# Patient Record
Sex: Female | Born: 1960
Health system: Southern US, Community
[De-identification: ages and names within clinical notes are randomized; demographics above are authoritative.]

## PROBLEM LIST (undated history)

## (undated) DIAGNOSIS — R0602 Shortness of breath: Secondary | ICD-10-CM

## (undated) DIAGNOSIS — E876 Hypokalemia: Secondary | ICD-10-CM

## (undated) DIAGNOSIS — I509 Heart failure, unspecified: Secondary | ICD-10-CM

## (undated) DIAGNOSIS — K219 Gastro-esophageal reflux disease without esophagitis: Secondary | ICD-10-CM

## (undated) DIAGNOSIS — R011 Cardiac murmur, unspecified: Secondary | ICD-10-CM

## (undated) DIAGNOSIS — I10 Essential (primary) hypertension: Secondary | ICD-10-CM

## (undated) DIAGNOSIS — D649 Anemia, unspecified: Secondary | ICD-10-CM

## (undated) DIAGNOSIS — E785 Hyperlipidemia, unspecified: Secondary | ICD-10-CM

## (undated) DIAGNOSIS — N189 Chronic kidney disease, unspecified: Secondary | ICD-10-CM

## (undated) DIAGNOSIS — L97909 Non-pressure chronic ulcer of unspecified part of unspecified lower leg with unspecified severity: Secondary | ICD-10-CM

## (undated) DIAGNOSIS — I83009 Varicose veins of unspecified lower extremity with ulcer of unspecified site: Secondary | ICD-10-CM

## (undated) DIAGNOSIS — J42 Unspecified chronic bronchitis: Secondary | ICD-10-CM

---

## 1989-04-21 HISTORY — PX: VAGINAL HYSTERECTOMY: SUR661

## 1998-05-26 ENCOUNTER — Emergency Department (HOSPITAL_COMMUNITY): Admission: EM | Admit: 1998-05-26 | Discharge: 1998-05-26 | Payer: Self-pay | Admitting: Emergency Medicine

## 1998-07-22 ENCOUNTER — Emergency Department (HOSPITAL_COMMUNITY): Admission: EM | Admit: 1998-07-22 | Discharge: 1998-07-22 | Payer: Self-pay | Admitting: Emergency Medicine

## 1998-11-20 ENCOUNTER — Emergency Department (HOSPITAL_COMMUNITY): Admission: EM | Admit: 1998-11-20 | Discharge: 1998-11-20 | Payer: Self-pay | Admitting: Emergency Medicine

## 2000-01-19 ENCOUNTER — Emergency Department (HOSPITAL_COMMUNITY): Admission: EM | Admit: 2000-01-19 | Discharge: 2000-01-19 | Payer: Self-pay | Admitting: Emergency Medicine

## 2000-01-19 ENCOUNTER — Encounter: Payer: Self-pay | Admitting: Emergency Medicine

## 2001-01-03 ENCOUNTER — Emergency Department (HOSPITAL_COMMUNITY): Admission: EM | Admit: 2001-01-03 | Discharge: 2001-01-03 | Payer: Self-pay | Admitting: Emergency Medicine

## 2001-01-03 ENCOUNTER — Encounter: Payer: Self-pay | Admitting: Emergency Medicine

## 2001-04-09 ENCOUNTER — Emergency Department (HOSPITAL_COMMUNITY): Admission: EM | Admit: 2001-04-09 | Discharge: 2001-04-09 | Payer: Self-pay

## 2001-10-25 ENCOUNTER — Emergency Department (HOSPITAL_COMMUNITY): Admission: EM | Admit: 2001-10-25 | Discharge: 2001-10-25 | Payer: Self-pay | Admitting: *Deleted

## 2002-01-23 ENCOUNTER — Emergency Department (HOSPITAL_COMMUNITY): Admission: EM | Admit: 2002-01-23 | Discharge: 2002-01-23 | Payer: Self-pay | Admitting: Emergency Medicine

## 2002-01-24 ENCOUNTER — Encounter: Payer: Self-pay | Admitting: Emergency Medicine

## 2002-08-08 ENCOUNTER — Encounter: Payer: Self-pay | Admitting: Emergency Medicine

## 2002-08-08 ENCOUNTER — Inpatient Hospital Stay (HOSPITAL_COMMUNITY): Admission: EM | Admit: 2002-08-08 | Discharge: 2002-08-09 | Payer: Self-pay | Admitting: Emergency Medicine

## 2003-07-15 ENCOUNTER — Inpatient Hospital Stay (HOSPITAL_COMMUNITY): Admission: EM | Admit: 2003-07-15 | Discharge: 2003-07-16 | Payer: Self-pay | Admitting: Emergency Medicine

## 2003-08-18 ENCOUNTER — Ambulatory Visit (HOSPITAL_COMMUNITY): Admission: RE | Admit: 2003-08-18 | Discharge: 2003-08-18 | Payer: Self-pay | Admitting: Internal Medicine

## 2003-08-22 ENCOUNTER — Emergency Department (HOSPITAL_COMMUNITY): Admission: EM | Admit: 2003-08-22 | Discharge: 2003-08-23 | Payer: Self-pay | Admitting: Emergency Medicine

## 2003-10-30 ENCOUNTER — Emergency Department (HOSPITAL_COMMUNITY): Admission: EM | Admit: 2003-10-30 | Discharge: 2003-10-30 | Payer: Self-pay | Admitting: Emergency Medicine

## 2003-11-01 ENCOUNTER — Emergency Department (HOSPITAL_COMMUNITY): Admission: EM | Admit: 2003-11-01 | Discharge: 2003-11-02 | Payer: Self-pay | Admitting: Emergency Medicine

## 2003-11-03 ENCOUNTER — Inpatient Hospital Stay (HOSPITAL_COMMUNITY): Admission: EM | Admit: 2003-11-03 | Discharge: 2003-11-07 | Payer: Self-pay | Admitting: Emergency Medicine

## 2004-02-15 ENCOUNTER — Inpatient Hospital Stay (HOSPITAL_COMMUNITY): Admission: EM | Admit: 2004-02-15 | Discharge: 2004-02-17 | Payer: Self-pay | Admitting: Emergency Medicine

## 2004-02-26 ENCOUNTER — Other Ambulatory Visit: Admission: RE | Admit: 2004-02-26 | Discharge: 2004-02-26 | Payer: Self-pay | Admitting: Obstetrics and Gynecology

## 2004-03-31 ENCOUNTER — Emergency Department (HOSPITAL_COMMUNITY): Admission: EM | Admit: 2004-03-31 | Discharge: 2004-03-31 | Payer: Self-pay | Admitting: Emergency Medicine

## 2004-04-18 ENCOUNTER — Emergency Department (HOSPITAL_COMMUNITY): Admission: EM | Admit: 2004-04-18 | Discharge: 2004-04-19 | Payer: Self-pay | Admitting: Emergency Medicine

## 2004-04-21 ENCOUNTER — Emergency Department (HOSPITAL_COMMUNITY): Admission: EM | Admit: 2004-04-21 | Discharge: 2004-04-21 | Payer: Self-pay | Admitting: Emergency Medicine

## 2004-05-05 ENCOUNTER — Encounter (INDEPENDENT_AMBULATORY_CARE_PROVIDER_SITE_OTHER): Payer: Self-pay | Admitting: *Deleted

## 2004-05-05 ENCOUNTER — Inpatient Hospital Stay (HOSPITAL_COMMUNITY): Admission: RE | Admit: 2004-05-05 | Discharge: 2004-05-13 | Payer: Self-pay | Admitting: Obstetrics and Gynecology

## 2004-05-09 ENCOUNTER — Encounter (INDEPENDENT_AMBULATORY_CARE_PROVIDER_SITE_OTHER): Payer: Self-pay | Admitting: Cardiology

## 2004-06-08 ENCOUNTER — Ambulatory Visit: Payer: Self-pay | Admitting: Family Medicine

## 2004-06-27 ENCOUNTER — Ambulatory Visit: Payer: Self-pay | Admitting: Family Medicine

## 2005-01-10 ENCOUNTER — Emergency Department (HOSPITAL_COMMUNITY): Admission: EM | Admit: 2005-01-10 | Discharge: 2005-01-10 | Payer: Self-pay | Admitting: Emergency Medicine

## 2005-01-16 ENCOUNTER — Emergency Department (HOSPITAL_COMMUNITY): Admission: AD | Admit: 2005-01-16 | Discharge: 2005-01-16 | Payer: Self-pay | Admitting: Family Medicine

## 2005-01-23 ENCOUNTER — Ambulatory Visit: Payer: Self-pay | Admitting: Family Medicine

## 2005-02-02 ENCOUNTER — Ambulatory Visit: Payer: Self-pay | Admitting: Family Medicine

## 2005-08-21 ENCOUNTER — Emergency Department (HOSPITAL_COMMUNITY): Admission: EM | Admit: 2005-08-21 | Discharge: 2005-08-21 | Payer: Self-pay | Admitting: Family Medicine

## 2006-01-06 ENCOUNTER — Emergency Department (HOSPITAL_COMMUNITY): Admission: EM | Admit: 2006-01-06 | Discharge: 2006-01-07 | Payer: Self-pay | Admitting: Emergency Medicine

## 2006-02-06 ENCOUNTER — Emergency Department (HOSPITAL_COMMUNITY): Admission: EM | Admit: 2006-02-06 | Discharge: 2006-02-06 | Payer: Self-pay | Admitting: Emergency Medicine

## 2006-02-15 ENCOUNTER — Emergency Department (HOSPITAL_COMMUNITY): Admission: EM | Admit: 2006-02-15 | Discharge: 2006-02-15 | Payer: Self-pay | Admitting: Emergency Medicine

## 2006-07-19 ENCOUNTER — Emergency Department (HOSPITAL_COMMUNITY): Admission: EM | Admit: 2006-07-19 | Discharge: 2006-07-19 | Payer: Self-pay | Admitting: Emergency Medicine

## 2006-12-04 ENCOUNTER — Ambulatory Visit: Payer: Self-pay | Admitting: Internal Medicine

## 2006-12-04 ENCOUNTER — Inpatient Hospital Stay (HOSPITAL_COMMUNITY): Admission: EM | Admit: 2006-12-04 | Discharge: 2006-12-06 | Payer: Self-pay | Admitting: Emergency Medicine

## 2006-12-05 ENCOUNTER — Encounter: Payer: Self-pay | Admitting: Internal Medicine

## 2007-02-07 ENCOUNTER — Emergency Department (HOSPITAL_COMMUNITY): Admission: EM | Admit: 2007-02-07 | Discharge: 2007-02-07 | Payer: Self-pay | Admitting: Emergency Medicine

## 2007-07-31 ENCOUNTER — Emergency Department (HOSPITAL_COMMUNITY): Admission: EM | Admit: 2007-07-31 | Discharge: 2007-07-31 | Payer: Self-pay | Admitting: Emergency Medicine

## 2007-10-09 ENCOUNTER — Emergency Department (HOSPITAL_COMMUNITY): Admission: EM | Admit: 2007-10-09 | Discharge: 2007-10-09 | Payer: Self-pay | Admitting: Emergency Medicine

## 2007-11-14 ENCOUNTER — Ambulatory Visit: Payer: Self-pay | Admitting: Internal Medicine

## 2007-11-14 ENCOUNTER — Ambulatory Visit: Payer: Self-pay | Admitting: Family Medicine

## 2007-11-21 ENCOUNTER — Ambulatory Visit: Payer: Self-pay | Admitting: *Deleted

## 2007-12-19 ENCOUNTER — Encounter (INDEPENDENT_AMBULATORY_CARE_PROVIDER_SITE_OTHER): Payer: Self-pay | Admitting: Emergency Medicine

## 2007-12-19 ENCOUNTER — Emergency Department (HOSPITAL_COMMUNITY): Admission: EM | Admit: 2007-12-19 | Discharge: 2007-12-19 | Payer: Self-pay | Admitting: Emergency Medicine

## 2007-12-19 ENCOUNTER — Ambulatory Visit: Payer: Self-pay | Admitting: Surgery

## 2008-01-01 IMAGING — CR DG CHEST 1V PORT
1 series · 1 of 1 positions shown · non-contrast
Comparison: [DATE].

CLINICAL DATA: Chest pain and shortness of breath. 
 PORTABLE CHEST ? 1 VIEW:

[view not recorded]
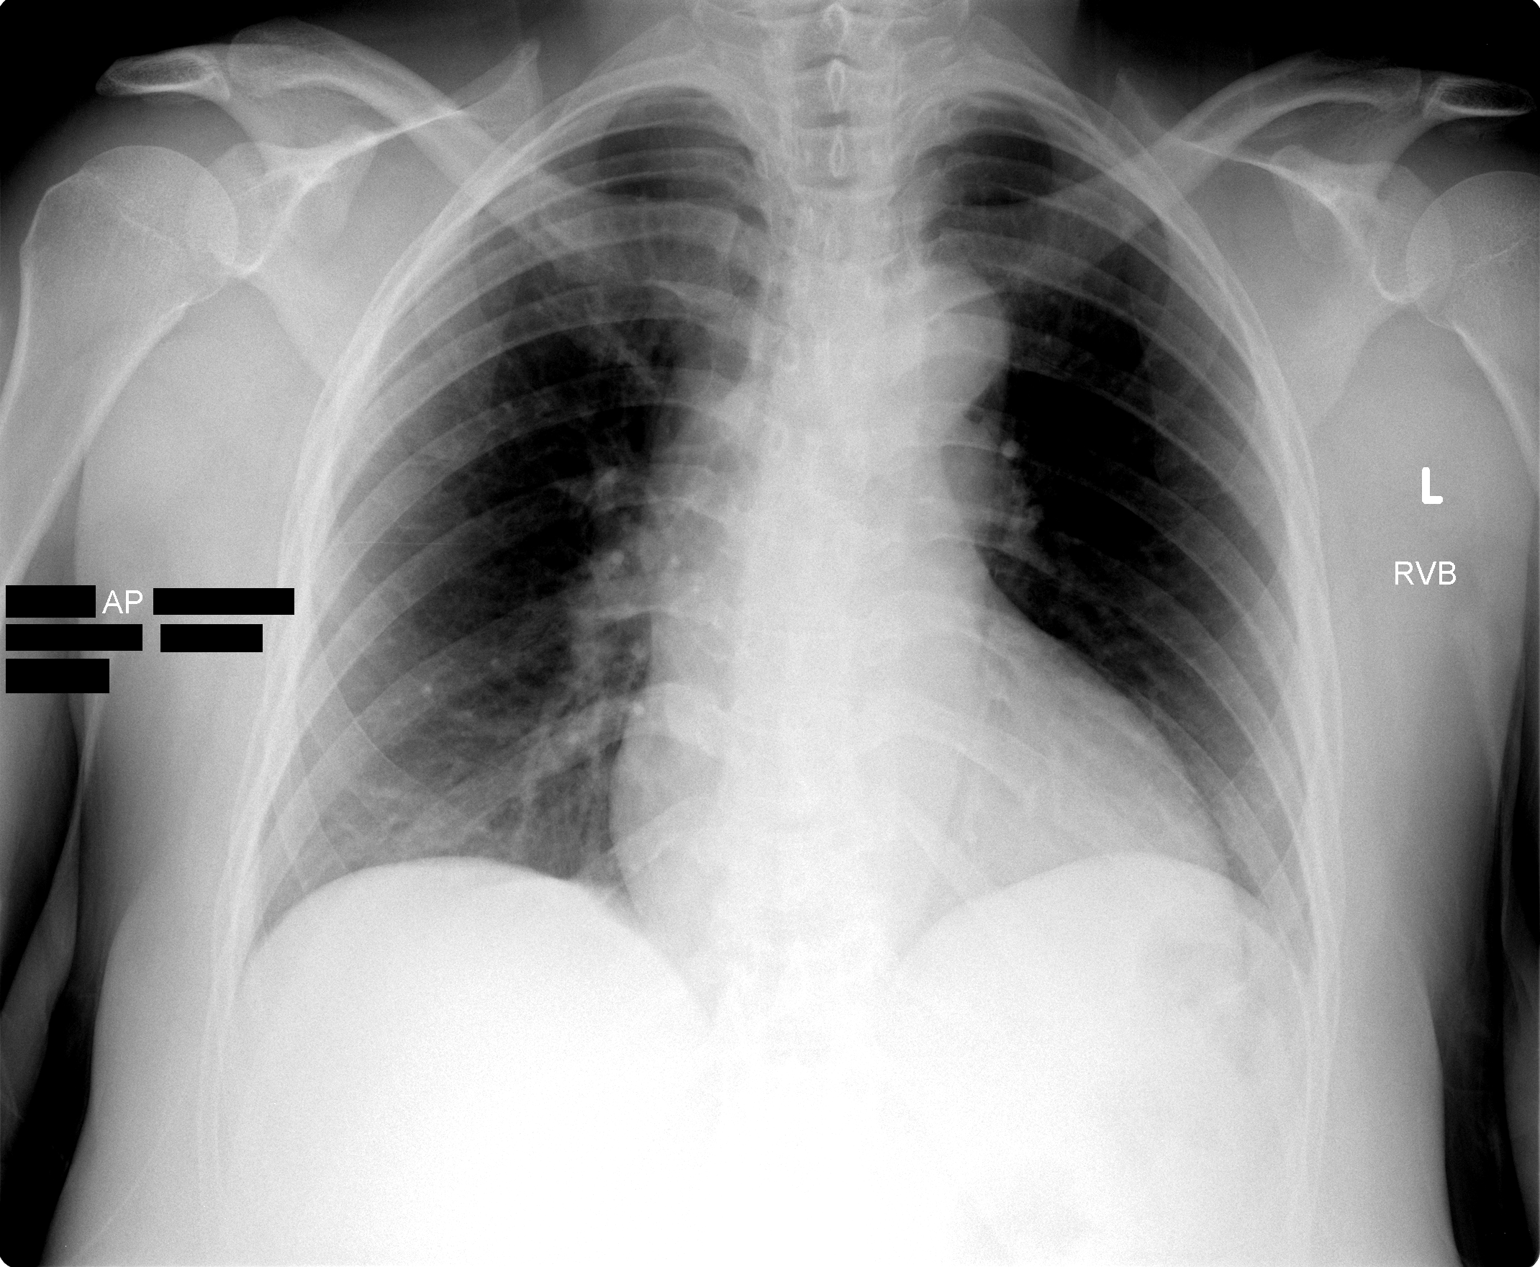

[1 of 1 positions shown; findings below may reference images not displayed]

FINDINGS: Heart size is normal. There are no effusions or interstitial edema.  There is mild atelectasis of the right base.
IMPRESSION: Mild right base atelectasis.

## 2008-04-06 ENCOUNTER — Ambulatory Visit: Payer: Self-pay | Admitting: Internal Medicine

## 2008-04-06 ENCOUNTER — Encounter: Payer: Self-pay | Admitting: Internal Medicine

## 2008-04-06 ENCOUNTER — Ambulatory Visit: Payer: Self-pay | Admitting: *Deleted

## 2008-04-06 LAB — CONVERTED CEMR LAB
ALT: 16 units/L (ref 0–35)
Alkaline Phosphatase: 45 units/L (ref 39–117)
Basophils Absolute: 0 10*3/uL (ref 0.0–0.1)
Basophils Relative: 0 % (ref 0–1)
CO2: 22 meq/L (ref 19–32)
Calcium: 8.8 mg/dL (ref 8.4–10.5)
Eosinophils Relative: 1 % (ref 0–5)
Hemoglobin: 12.6 g/dL (ref 12.0–15.0)
MCHC: 31.1 g/dL (ref 30.0–36.0)
Monocytes Absolute: 0.3 10*3/uL (ref 0.1–1.0)
Potassium: 3.4 meq/L — ABNORMAL LOW (ref 3.5–5.3)
Sodium: 140 meq/L (ref 135–145)
Total Bilirubin: 0.9 mg/dL (ref 0.3–1.2)
Total Protein: 7.1 g/dL (ref 6.0–8.3)
WBC: 4.6 10*3/uL (ref 4.0–10.5)

## 2008-08-05 ENCOUNTER — Inpatient Hospital Stay (HOSPITAL_COMMUNITY): Admission: EM | Admit: 2008-08-05 | Discharge: 2008-08-07 | Payer: Self-pay | Admitting: Internal Medicine

## 2008-08-05 ENCOUNTER — Ambulatory Visit: Payer: Self-pay | Admitting: Cardiology

## 2008-08-06 ENCOUNTER — Encounter (INDEPENDENT_AMBULATORY_CARE_PROVIDER_SITE_OTHER): Payer: Self-pay | Admitting: Internal Medicine

## 2008-12-03 ENCOUNTER — Ambulatory Visit: Payer: Self-pay | Admitting: Family Medicine

## 2008-12-07 ENCOUNTER — Ambulatory Visit (HOSPITAL_COMMUNITY): Admission: RE | Admit: 2008-12-07 | Discharge: 2008-12-07 | Payer: Self-pay | Admitting: Family Medicine

## 2009-05-12 ENCOUNTER — Emergency Department (HOSPITAL_COMMUNITY): Admission: EM | Admit: 2009-05-12 | Discharge: 2009-05-12 | Payer: Self-pay | Admitting: Emergency Medicine

## 2009-08-21 ENCOUNTER — Emergency Department (HOSPITAL_COMMUNITY): Admission: EM | Admit: 2009-08-21 | Discharge: 2009-08-21 | Payer: Self-pay | Admitting: Emergency Medicine

## 2010-02-13 ENCOUNTER — Emergency Department (HOSPITAL_COMMUNITY): Admission: EM | Admit: 2010-02-13 | Discharge: 2010-02-13 | Payer: Self-pay | Admitting: Emergency Medicine

## 2010-02-13 ENCOUNTER — Encounter (INDEPENDENT_AMBULATORY_CARE_PROVIDER_SITE_OTHER): Payer: Self-pay | Admitting: Emergency Medicine

## 2010-02-13 ENCOUNTER — Ambulatory Visit: Payer: Self-pay | Admitting: Surgery

## 2010-03-05 ENCOUNTER — Emergency Department (HOSPITAL_COMMUNITY): Admission: EM | Admit: 2010-03-05 | Discharge: 2010-03-05 | Payer: Self-pay | Admitting: Emergency Medicine

## 2010-03-26 ENCOUNTER — Emergency Department (HOSPITAL_COMMUNITY): Admission: EM | Admit: 2010-03-26 | Discharge: 2010-03-26 | Payer: Self-pay | Admitting: Emergency Medicine

## 2010-03-26 ENCOUNTER — Encounter (INDEPENDENT_AMBULATORY_CARE_PROVIDER_SITE_OTHER): Payer: Self-pay | Admitting: Emergency Medicine

## 2010-03-26 ENCOUNTER — Ambulatory Visit: Payer: Self-pay | Admitting: Vascular Surgery

## 2010-05-28 ENCOUNTER — Emergency Department (HOSPITAL_COMMUNITY): Admission: EM | Admit: 2010-05-28 | Discharge: 2010-05-29 | Payer: Self-pay | Admitting: Emergency Medicine

## 2010-06-03 ENCOUNTER — Encounter (HOSPITAL_BASED_OUTPATIENT_CLINIC_OR_DEPARTMENT_OTHER)
Admission: RE | Admit: 2010-06-03 | Discharge: 2010-09-01 | Payer: Self-pay | Source: Home / Self Care | Attending: Internal Medicine | Admitting: Internal Medicine

## 2010-06-07 ENCOUNTER — Ambulatory Visit: Payer: Self-pay | Admitting: Family Medicine

## 2010-09-05 ENCOUNTER — Encounter (HOSPITAL_BASED_OUTPATIENT_CLINIC_OR_DEPARTMENT_OTHER)
Admission: RE | Admit: 2010-09-05 | Discharge: 2010-09-20 | Payer: Self-pay | Source: Home / Self Care | Attending: Internal Medicine | Admitting: Internal Medicine

## 2010-11-04 LAB — CBC
MCHC: 31.6 g/dL (ref 30.0–36.0)
MCV: 88 fL (ref 78.0–100.0)

## 2010-11-04 LAB — DIFFERENTIAL
Basophils Absolute: 0 10*3/uL (ref 0.0–0.1)
Basophils Relative: 0 % (ref 0–1)
Eosinophils Absolute: 0.1 10*3/uL (ref 0.0–0.7)
Eosinophils Relative: 1 % (ref 0–5)
Lymphs Abs: 2.8 10*3/uL (ref 0.7–4.0)
Monocytes Absolute: 0.4 10*3/uL (ref 0.1–1.0)
Neutro Abs: 3.7 10*3/uL (ref 1.7–7.7)
Neutrophils Relative %: 53 % (ref 43–77)

## 2010-11-04 LAB — BASIC METABOLIC PANEL
CO2: 30 mEq/L (ref 19–32)
GFR calc Af Amer: >60 mL/min (ref >60)
GFR calc non Af Amer: >60 mL/min (ref >60)

## 2010-11-05 LAB — DIFFERENTIAL
Basophils Absolute: 0 10*3/uL (ref 0.0–0.1)
Basophils Relative: 0 % (ref 0–1)
Eosinophils Relative: 1 % (ref 0–5)
Monocytes Absolute: 0.4 10*3/uL (ref 0.1–1.0)
Neutrophils Relative %: 57 % (ref 43–77)

## 2010-11-05 LAB — CBC
HCT: 36.2 % (ref 36.0–46.0)
MCHC: 33.6 g/dL (ref 30.0–36.0)
MCV: 89.4 fL (ref 78.0–100.0)
Platelets: 250 10*3/uL (ref 150–400)
RDW: 13.4 % (ref 11.5–15.5)

## 2010-11-05 LAB — POCT CARDIAC MARKERS: Troponin i, poc: <0.05 ng/mL (ref 0.00–0.09)

## 2010-11-05 LAB — URINALYSIS, ROUTINE W REFLEX MICROSCOPIC
Glucose, UA: NEGATIVE mg/dL
Urobilinogen, UA: 0.2 mg/dL (ref 0.0–1.0)

## 2010-11-05 LAB — COMPREHENSIVE METABOLIC PANEL
Albumin: 3.8 g/dL (ref 3.5–5.2)
Alkaline Phosphatase: 52 U/L (ref 39–117)
CO2: 27 mEq/L (ref 19–32)
Chloride: 104 mEq/L (ref 96–112)
GFR calc Af Amer: >60 mL/min (ref >60)
GFR calc non Af Amer: >60 mL/min (ref >60)
Sodium: 141 mEq/L (ref 135–145)
Total Bilirubin: 0.3 mg/dL (ref 0.3–1.2)

## 2010-11-05 LAB — RAPID URINE DRUG SCREEN, HOSP PERFORMED
Benzodiazepines: NOT DETECTED
Cocaine: POSITIVE — AB

## 2010-11-06 LAB — URINE MICROSCOPIC-ADD ON

## 2010-11-06 LAB — URINALYSIS, ROUTINE W REFLEX MICROSCOPIC
Bilirubin Urine: NEGATIVE
Leukocytes, UA: NEGATIVE
Nitrite: NEGATIVE
Specific Gravity, Urine: 1.02 (ref 1.005–1.030)
pH: 5.5 (ref 5.0–8.0)

## 2010-11-06 LAB — DIFFERENTIAL
Basophils Absolute: 0 10*3/uL (ref 0.0–0.1)
Eosinophils Relative: 2 % (ref 0–5)
Lymphocytes Relative: 31 % (ref 12–46)
Monocytes Absolute: 0.4 10*3/uL (ref 0.1–1.0)

## 2010-11-06 LAB — POCT CARDIAC MARKERS
CKMB, poc: 3 ng/mL (ref 1.0–8.0)
Troponin i, poc: <0.05 ng/mL (ref 0.00–0.09)

## 2010-11-06 LAB — CBC
HCT: 33.2 % — ABNORMAL LOW (ref 36.0–46.0)
MCH: 29.8 pg (ref 26.0–34.0)
MCHC: 33.7 g/dL (ref 30.0–36.0)
Platelets: 276 10*3/uL (ref 150–400)
RBC: 3.75 MIL/uL — ABNORMAL LOW (ref 3.87–5.11)

## 2010-11-06 LAB — COMPREHENSIVE METABOLIC PANEL
Albumin: 3.6 g/dL (ref 3.5–5.2)
Alkaline Phosphatase: 51 U/L (ref 39–117)
BUN: 13 mg/dL (ref 6–23)
CO2: 27 mEq/L (ref 19–32)
Chloride: 107 mEq/L (ref 96–112)
GFR calc Af Amer: >60 mL/min (ref >60)
GFR calc non Af Amer: >60 mL/min (ref >60)
Glucose, Bld: 106 mg/dL — ABNORMAL HIGH (ref 70–99)
Total Bilirubin: 0.4 mg/dL (ref 0.3–1.2)
Total Protein: 7.1 g/dL (ref 6.0–8.3)

## 2010-11-06 LAB — BRAIN NATRIURETIC PEPTIDE: Pro B Natriuretic peptide (BNP): 33.6 pg/mL (ref 0.0–100.0)

## 2010-11-06 LAB — RAPID URINE DRUG SCREEN, HOSP PERFORMED: Tetrahydrocannabinol: NOT DETECTED

## 2010-11-06 LAB — KETONES, QUALITATIVE: Acetone, Bld: NEGATIVE

## 2010-11-25 LAB — DIFFERENTIAL
Basophils Absolute: 0 10*3/uL (ref 0.0–0.1)
Eosinophils Relative: 1 % (ref 0–5)
Lymphocytes Relative: 40 % (ref 12–46)
Neutro Abs: 2.2 10*3/uL (ref 1.7–7.7)

## 2010-11-25 LAB — POCT I-STAT, CHEM 8
BUN: 6 mg/dL (ref 6–23)
Chloride: 101 mEq/L (ref 96–112)
HCT: 40 % (ref 36.0–46.0)
Sodium: 143 mEq/L (ref 135–145)
TCO2: 29 mmol/L (ref 0–100)

## 2010-11-25 LAB — POCT CARDIAC MARKERS
Troponin i, poc: <0.05 ng/mL (ref 0.00–0.09)
Troponin i, poc: <0.05 ng/mL (ref 0.00–0.09)

## 2010-11-25 LAB — CBC
Platelets: 275 10*3/uL (ref 150–400)
RDW: 12.9 % (ref 11.5–15.5)

## 2010-12-19 ENCOUNTER — Encounter (HOSPITAL_BASED_OUTPATIENT_CLINIC_OR_DEPARTMENT_OTHER): Payer: Self-pay | Attending: Internal Medicine

## 2010-12-19 DIAGNOSIS — L97309 Non-pressure chronic ulcer of unspecified ankle with unspecified severity: Secondary | ICD-10-CM | POA: Insufficient documentation

## 2010-12-19 DIAGNOSIS — L8992 Pressure ulcer of unspecified site, stage 2: Secondary | ICD-10-CM | POA: Insufficient documentation

## 2010-12-19 DIAGNOSIS — L97509 Non-pressure chronic ulcer of other part of unspecified foot with unspecified severity: Secondary | ICD-10-CM | POA: Insufficient documentation

## 2010-12-19 DIAGNOSIS — L89109 Pressure ulcer of unspecified part of back, unspecified stage: Secondary | ICD-10-CM | POA: Insufficient documentation

## 2010-12-19 DIAGNOSIS — M7989 Other specified soft tissue disorders: Secondary | ICD-10-CM | POA: Insufficient documentation

## 2010-12-22 ENCOUNTER — Encounter (HOSPITAL_BASED_OUTPATIENT_CLINIC_OR_DEPARTMENT_OTHER): Payer: Self-pay | Attending: Internal Medicine

## 2010-12-22 DIAGNOSIS — I10 Essential (primary) hypertension: Secondary | ICD-10-CM | POA: Insufficient documentation

## 2010-12-22 DIAGNOSIS — L97809 Non-pressure chronic ulcer of other part of unspecified lower leg with unspecified severity: Secondary | ICD-10-CM | POA: Insufficient documentation

## 2010-12-22 DIAGNOSIS — Z79899 Other long term (current) drug therapy: Secondary | ICD-10-CM | POA: Insufficient documentation

## 2010-12-22 DIAGNOSIS — A4901 Methicillin susceptible Staphylococcus aureus infection, unspecified site: Secondary | ICD-10-CM | POA: Insufficient documentation

## 2010-12-22 DIAGNOSIS — L02419 Cutaneous abscess of limb, unspecified: Secondary | ICD-10-CM | POA: Insufficient documentation

## 2010-12-22 DIAGNOSIS — I872 Venous insufficiency (chronic) (peripheral): Secondary | ICD-10-CM | POA: Insufficient documentation

## 2010-12-22 DIAGNOSIS — A498 Other bacterial infections of unspecified site: Secondary | ICD-10-CM | POA: Insufficient documentation

## 2010-12-28 ENCOUNTER — Encounter (HOSPITAL_COMMUNITY): Payer: Self-pay | Admitting: Radiology

## 2010-12-28 ENCOUNTER — Emergency Department (HOSPITAL_COMMUNITY): Payer: Self-pay

## 2010-12-28 ENCOUNTER — Inpatient Hospital Stay (HOSPITAL_COMMUNITY): Payer: Self-pay

## 2010-12-28 ENCOUNTER — Inpatient Hospital Stay (HOSPITAL_COMMUNITY)
Admission: EM | Admit: 2010-12-28 | Discharge: 2010-12-30 | DRG: 292 | Disposition: A | Discharge status: Home / Self Care | Payer: Self-pay | Attending: Internal Medicine | Admitting: Internal Medicine

## 2010-12-28 DIAGNOSIS — I868 Varicose veins of other specified sites: Secondary | ICD-10-CM | POA: Diagnosis present

## 2010-12-28 DIAGNOSIS — I517 Cardiomegaly: Secondary | ICD-10-CM | POA: Diagnosis present

## 2010-12-28 DIAGNOSIS — M7120 Synovial cyst of popliteal space [Baker], unspecified knee: Secondary | ICD-10-CM | POA: Diagnosis present

## 2010-12-28 DIAGNOSIS — D563 Thalassemia minor: Secondary | ICD-10-CM | POA: Diagnosis present

## 2010-12-28 DIAGNOSIS — I5033 Acute on chronic diastolic (congestive) heart failure: Principal | ICD-10-CM | POA: Diagnosis present

## 2010-12-28 DIAGNOSIS — E876 Hypokalemia: Secondary | ICD-10-CM | POA: Diagnosis present

## 2010-12-28 DIAGNOSIS — M79609 Pain in unspecified limb: Secondary | ICD-10-CM

## 2010-12-28 DIAGNOSIS — Z6834 Body mass index (BMI) 34.0-34.9, adult: Secondary | ICD-10-CM

## 2010-12-28 DIAGNOSIS — J45909 Unspecified asthma, uncomplicated: Secondary | ICD-10-CM | POA: Diagnosis present

## 2010-12-28 DIAGNOSIS — F17200 Nicotine dependence, unspecified, uncomplicated: Secondary | ICD-10-CM | POA: Diagnosis present

## 2010-12-28 DIAGNOSIS — I509 Heart failure, unspecified: Secondary | ICD-10-CM | POA: Diagnosis present

## 2010-12-28 DIAGNOSIS — R079 Chest pain, unspecified: Secondary | ICD-10-CM | POA: Diagnosis present

## 2010-12-28 DIAGNOSIS — Z91199 Patient's noncompliance with other medical treatment and regimen due to unspecified reason: Secondary | ICD-10-CM

## 2010-12-28 DIAGNOSIS — E279 Disorder of adrenal gland, unspecified: Secondary | ICD-10-CM | POA: Diagnosis present

## 2010-12-28 DIAGNOSIS — E785 Hyperlipidemia, unspecified: Secondary | ICD-10-CM | POA: Diagnosis present

## 2010-12-28 DIAGNOSIS — D649 Anemia, unspecified: Secondary | ICD-10-CM | POA: Diagnosis present

## 2010-12-28 DIAGNOSIS — Z9119 Patient's noncompliance with other medical treatment and regimen: Secondary | ICD-10-CM

## 2010-12-28 DIAGNOSIS — I10 Essential (primary) hypertension: Secondary | ICD-10-CM | POA: Diagnosis present

## 2010-12-28 DIAGNOSIS — F1910 Other psychoactive substance abuse, uncomplicated: Secondary | ICD-10-CM | POA: Diagnosis present

## 2010-12-28 LAB — CBC
HCT: 38.1 % (ref 36.0–46.0)
Hemoglobin: 12.1 g/dL (ref 12.0–15.0)
MCHC: 31.8 g/dL (ref 30.0–36.0)
RBC: 4.33 MIL/uL (ref 3.87–5.11)
WBC: 5.2 10*3/uL (ref 4.0–10.5)

## 2010-12-28 LAB — DIFFERENTIAL
Basophils Absolute: 0 10*3/uL (ref 0.0–0.1)
Lymphocytes Relative: 36 % (ref 12–46)
Monocytes Absolute: 0.4 10*3/uL (ref 0.1–1.0)
Neutro Abs: 2.8 10*3/uL (ref 1.7–7.7)
Neutrophils Relative %: 55 % (ref 43–77)

## 2010-12-28 LAB — BASIC METABOLIC PANEL
Calcium: 9 mg/dL (ref 8.4–10.5)
Creatinine, Ser: 0.87 mg/dL (ref 0.4–1.2)
GFR calc Af Amer: >60 mL/min (ref >60)
GFR calc non Af Amer: >60 mL/min (ref >60)
Sodium: 140 mEq/L (ref 135–145)

## 2010-12-28 LAB — MAGNESIUM: Magnesium: 1.7 mg/dL (ref 1.5–2.5)

## 2010-12-28 LAB — POCT CARDIAC MARKERS
CKMB, poc: 1.2 ng/mL (ref 1.0–8.0)
Myoglobin, poc: 71.5 ng/mL (ref 12–200)
Troponin i, poc: <0.05 ng/mL (ref 0.00–0.09)

## 2010-12-28 LAB — HEMOGLOBIN A1C: Mean Plasma Glucose: 103 mg/dL (ref <117)

## 2010-12-28 LAB — PRO B NATRIURETIC PEPTIDE: Pro B Natriuretic peptide (BNP): 390.5 pg/mL — ABNORMAL HIGH (ref 0–125)

## 2010-12-28 MED ORDER — IOHEXOL 300 MG/ML  SOLN
100.0000 mL | Freq: Once | INTRAMUSCULAR | Status: AC | PRN
  Administered 2010-12-28: 100 mL via INTRAVENOUS

## 2010-12-28 NOTE — H&P (Signed)
[name], HORNBECK NO.:  [id number]  MEDICAL RECORD NO.:  [id number]           PATIENT TYPE:  E  LOCATION:  [facility]                         FACILITY:  [facility]  PHYSICIAN:  Marinda Elk, M.D.DATE OF BIRTH:  1961-04-22  DATE OF ADMISSION:  12/28/2010 DATE OF DISCHARGE:                             HISTORY & PHYSICAL   PRIMARY CARE DOCTOR:  [facility].  CHIEF COMPLAINT:  Chest discomfort.  HISTORY OF PRESENT ILLNESS:  This is a 50 year old female with past medical history of malignant hypertension, history of transient chest pain, history of cardiomegaly with polysubstance abuse and diastolic dysfunction by 2-D echo in December 2009 that comes in for chest discomfort.  The patient relates that this has been going on for some weeks.  Has progressively been getting worse.  She relates she has taking her clonidine regularly, but has run out of her other hypertensive medication, lisinopril, hydrochlorothiazide and atenolol has not taken it for the 5 days.  She relates that her chest discomfort has gotten so worse to the point where she cannot sleep flat at night. She lay in bed, in the recliner.  She says she could not go up the stairs without stopping.  She has stopped half way and stopped for 10 minutes, noted to catch her breath.  She relates sometime she wakes up in the middle of night short of breath.  And has to sit up and start walking and has to get up and sit and it gets better.  She relates she has not used drug recently.  She relates that she feels like a chest discomfort retrosternally.  Nothing makes it better or worse.  ALLERGIES:  No known drug allergies.  PAST MEDICAL HISTORY: 1. Malignant hypertension. 2. Transient paresthesia and blurry vision secondary to malignant     hypertension. 3. History of asthma. 4. History of polysubstance abuse. 5. Ongoing tobacco. 6. Hypokalemia. 7. Cardiomegaly. 8. Dyslipidemia. 9. Normocytic  anemia. 10.Diastolic heart failure. 11.Right adrenal mass likely benign adenoma. 12.Cocaine abuse. 13.Trichomoniasis.  MEDICATIONS: 1. She is on lisinopril/hydrochlorothiazide 20/25 mg p.o. daily. 2. Tylenol 325 mg 2 tabs b.i.d. as needed. 3. Doxycycline 100 mg p.o. daily. 4. Atenolol 50 mg every morning. 5. Clonidine 0.2 mg every 8 hours.  SOCIAL HISTORY:  She is single, lives with her daughter.  Denies any drugs in the last few months.  Continues to smoke one pack per day. Alcohol occasionally, about 2 beers on weekends.  FAMILY HISTORY:  Only significant for hypertension.  She does not remember anything else.  REVIEW OF SYSTEM:  GENERAL:  Positive for fatigability and dizziness. Eyes are positive.  Negative for blurry vision, double vision.  EARS: No tinnitus.  No drainage.  RESPIRATION:  No cough.  No wheezing. CARDIOVASCULAR:  Wise as above.  GI:  No nausea, vomiting or diarrhea. GU:  No dysuria, no hematuria, no polyuria.  HEMATOLOGY:  No bruising or bleeding.  No melena.  PHYSICAL EXAM:  VITAL SIGNS:  Temperature 99, blood pressure 205/130. She was breathing 20 times per minute, satting 99% on room air, heart rate is 70. GENERAL:  She is awake, alert and oriented  x3, lying comfortably in bed, in no acute distress, able to speak in full sentences. HEENT:  Normocephalic, atraumatic.  Pupils equally round and reactive to light.  Anicteric.  No jaundice.  No JVD.  No bruit.  No thyromegaly. No carotid bruit. LUNGS:  Good air movement with clear to auscultation. ABDOMEN:  Positive bowel sounds, nontender, nondistended and soft. EXTREMITIES:  Positive pulses, 2+ edema.  She has right leg which is wrapped today.  Nontender to palpation.  On the left, she has a knot like tender to palpation and mobile.  Appears to be a Baker's cyst. NEUROLOGIC:  She is alert, awake and oriented x4, coherent, fluent language and nonfocal.  LABS ON ADMISSION:  Sodium 140, potassium 3.3,  chloride 103, bicarb of 29, glucose of 90, BUN of 16, creatinine 0.8, calcium 9.9.  Her first set of point cardiac care markers are negative x1.  Her white count is 5.2, hemoglobin of 12, platelet count of 303, ANC of 2.8.  EKG shows pending at this time of this dictation.  CT angio is negative for PE and Doppler of lower extremity is negative for DVT which shows a Baker's cyst.  ASSESSMENT AND PLAN: 1. Malignant hypertension secondary to noncompliance.  She does have a     history of having malignant hypertension.  At this time, we will     start her on Lasix 20 mg IV b.i.d.  We will start her on     lisinopril.  We will discontinue the clonidine, start her on Coreg     3.125.  Also, we will check her UDS.  She has a history of     noncompliance that might be contributing to this.  We will hold her     blood pressure medication if they drop to less 160 systolic blood pressure. 2. Dyspnea on exertion, most likely concern for acute diastolic     decompensated heart failure.  We will recycle cardiac enzymes,     check a 2-D echo as mentioned below.  We will start her on Lasix.     We will check her BMP and TSH.  Titrate medications as tolerated. We will discontinue the clonidine at this time.  We will try to     maximize on the ACE . 3. Hypokalemia.  We will replete and check her mag. 4. Baker cyst.  Currently ruptures.  No DVT.  Apply ice, elevate legs,     NSAIDS for 48 hours, got to be careful with the NSAIDS as we are     starting her on an ACE and this might send her to renal failure.     So, I will only use it over 48 hours. 5. Chest pain most likely secondary to malignant hypertension and     dyspnea on exertion.  We will cycle cardiac enzymes.  Check a 2-D     echo. 6. Right venous ulcer.  We will continue Endoxan and will get wound     care.     Marinda Elk, M.D.     AF/MEDQ  D:  12/28/2010  T:  12/28/2010  Job:  [id number]  cc:   Clinic  [facility]  Electronically Signed by Marinda Elk M.D. on 12/28/2010 09:23:17 PM

## 2010-12-29 LAB — BASIC METABOLIC PANEL
Calcium: 9.6 mg/dL (ref 8.4–10.5)
Creatinine, Ser: 0.71 mg/dL (ref 0.4–1.2)
GFR calc Af Amer: >60 mL/min (ref >60)
GFR calc non Af Amer: >60 mL/min (ref >60)

## 2010-12-29 LAB — CBC
HCT: 36.9 % (ref 36.0–46.0)
Hemoglobin: 12 g/dL (ref 12.0–15.0)
RBC: 4.22 MIL/uL (ref 3.87–5.11)

## 2010-12-29 LAB — CARDIAC PANEL(CRET KIN+CKTOT+MB+TROPI)
CK, MB: 2.1 ng/mL (ref 0.3–4.0)
Relative Index: INVALID (ref 0.0–2.5)
Relative Index: INVALID (ref 0.0–2.5)
Troponin I: <0.3 ng/mL (ref <0.30)
Troponin I: <0.3 ng/mL (ref <0.30)

## 2010-12-29 LAB — TSH: TSH: 1.047 u[IU]/mL (ref 0.350–4.500)

## 2010-12-30 LAB — CBC
MCH: 27.7 pg (ref 26.0–34.0)
MCHC: 31.7 g/dL (ref 30.0–36.0)
MCV: 87.3 fL (ref 78.0–100.0)
Platelets: 273 10*3/uL (ref 150–400)
RDW: 12.7 % (ref 11.5–15.5)

## 2010-12-30 LAB — MAGNESIUM: Magnesium: 1.6 mg/dL (ref 1.5–2.5)

## 2010-12-30 LAB — RAPID URINE DRUG SCREEN, HOSP PERFORMED
Cocaine: NOT DETECTED
Tetrahydrocannabinol: NOT DETECTED

## 2010-12-30 LAB — BASIC METABOLIC PANEL
Chloride: 97 mEq/L (ref 96–112)
Creatinine, Ser: 0.7 mg/dL (ref 0.4–1.2)
GFR calc Af Amer: >60 mL/min (ref >60)
Potassium: 3 mEq/L — ABNORMAL LOW (ref 3.5–5.1)

## 2011-01-03 NOTE — H&P (Signed)
[name], [name] NO.:  [id number]   MEDICAL RECORD NO.:  [id number]          PATIENT TYPE:  EMS   LOCATION:  [facility]                         FACILITY:  [facility]   PHYSICIAN:  Michelene Gardener, MD    DATE OF BIRTH:  1961/02/22   DATE OF ADMISSION:  08/05/2008  DATE OF DISCHARGE:                              HISTORY & PHYSICAL   PRIMARY PHYSICIAN:  [facility].  The patient will be admitted as  unassigned.   CHIEF COMPLAINT:  Left-sided numbness and blurry vision for the last 2  days.   HISTORY OF PRESENT ILLNESS:  This is a 50 year old African American  female with past medical history of hypertension and asthma.  Her blood  pressure has not been well-controlled and her medications have to be  adjusted many times by her primary doctor.  Currently she is on  hydrochlorothiazide/lisinopril and clonidine.  Last time she saw her  primary doctor was around 1 month ago and at that time clonidine was  started.  The patient has been complaining of left-sided heaviness and  numbness feeling for the last 2 days associated with occasional blurry  vision.  Last night she checked her blood pressure at her work and her  systolic blood pressure was around 190.  As per her she has been taking  her medications and the last time she took her medicine was this  morning.  She missed her medications only one time last week.  She came  into the ER today for further evaluation because her symptoms are not  improving.  In the ER her blood pressure was found to be 201/119.  CT  scan of the head was done and came to be negative.  The patient was  given antihypertensive labetalol and her blood pressure improved around  180/98.  Right now systolic blood pressure is still about 190.  The  patient still has some numbness in her left side, she still had blurry  vision in both eyes.  She denied weakness.  She denied slurred speech.  She denied chest pain but she had some shortness of breath  when she  walks for a distance and she feels lightheadedness when she walks.   PAST MEDICAL HISTORY:  1. Significant for hard to control blood pressure with question of      noncompliance.  2. History of asthma.   PAST SURGICAL HISTORY:  Denied.  She has history of total abdominal  hysterectomy in September of 2005 with 4 fibroids.   ALLERGIES:  No known drug allergies.   CURRENT MEDICATIONS:  1. Clonidine 0.2 mg twice daily.  2. Lisinopril/hydrochlorothiazide 20/25 mg p.o. once daily.  3. ProAir 2 puffs q.4 hours as needed.   SOCIAL HISTORY:  The patient is single.  She had a prior history of  cocaine abuse though she denied any at the present time.  She smokes one  pack per day and she has been smoking since she was 50 years old.  She  drinks alcohol occasionally.   FAMILY HISTORY:  Is significant for hypertension.   REVIEW OF SYSTEMS:  CONSTITUTIONAL:  Positive for  fatigability and  dizziness.  EYES:  Positive for blurred vision.  EARS:  No tinnitus.  RESPIRATORY:  No cough, no wheezes.  CARDIOVASCULAR:  No chest pain but  she had mild shortness of breath.  There is no syncope.  There are no  palpitations.  GI:  Positive for nausea.  There is no vomiting and no  abdominal pain.  GU:  No dysuria, hematuria.  ENDOCRINE:  No polyuria,  no nocturia.  HEMATOLOGY:  No bruises, no bleeding.  ID:  No rash, no lesions.  NEURO:  Positive for dizziness, left-sided numbness and blurry vision.  The rest  of review of systems were reviewed and they were negative.   PHYSICAL EXAMINATION:  VITAL SIGNS:  Temperature is 98.3.  Blood  pressure on presentation was 201/119.  Currently around 190/98, pulse  75, respiratory rate 20.  GENERAL APPEARANCE:  This is an obese, middle-aged [place] American  female not in acute distress.  HEENT:  Her conjunctivae are pink.  Pupils are equal and reactive to  light.  There is no ptosis.  Hearing is intact.  There is no ear  discharge or infection.   There is no nose infection or bleeding.  Oral  mucosa dry.  No pharyngeal erythema.  NECK:  Neck is supple.  No JVD, no carotid bruit, no thyroid enlargement  or thyroid tenderness.  CARDIOVASCULAR:  S1, S2 regular.  There are no  murmurs, no gallops and no thrills.  RESPIRATORY:  The patient is breathing between 16-18.  There are no  rales.  There are no rhonchi.  There are no wheezes.  ABDOMEN:  The abdomen is obese, soft, nondistended.  There is no  tenderness.  There is no hepatosplenomegaly.  Bowel sounds are present.  LOWER EXTREMITIES:  No edema, no rash and no varicose veins.  SKIN:  No rash and erythema.  NEURO:  Cranial nerves are intact from II-XII.  The patient had  difficulty moving her left lower extremity and she said this is mainly  because she feels some pain.  Strength otherwise is 5/5 in other  extremities.  Sensation is mildly decreased in the left lower extremity.   LAB RESULTS:  Urinalysis is negative.  B-type natriuretic peptide is 54,  hemoglobin 13.3, hematocrit 39.0, sodium 139, potassium 3.4, chloride  102, bicarb 29, BUN 14, creatinine 0.8, glucose 96, troponin is less  than 0.05, myoglobin is a 69.6.  EKG is not done   IMPRESSION:  1. Malignant hypertension.  2. Possible transient ischemic attack.  3. Obesity.  4. Hypokalemia.  5. Tobacco abuse.   PLAN:  1. Malignant hypertension.  This patient has a difficult time      controlling her blood pressure and she has been switching between      different medicines without good results.  Currently she is on      clonidine and lisinopril and hydrochlorothiazide.  She came in with      blood pressure more than 200 which is symptomatic with left-sided      numbness, mild shortness of breath and blurry vision.  I will admit      her to step-down unit.  I will give her 0.3 clonidine now and if      her blood pressure continues to be high then we will consider to      start her on labetalol drip.  Regarding her  medications I will      increase her clonidine to 0.3 mg three times  daily.  Will increase      her lisinopril/hydrochlorothiazide to 40/25 mg once a day.  I will      add Norvasc 5 mg once a day.  We will get echocardiogram for      further assessment.  2. Chest pain.  This patient developed one episode of chest pain      during her CT scan of the head and that resolved.  As mentioned      above she will be monitored in tele.  Will get three sets of      troponin and cardiac enzymes.  Will get echocardiogram to assess      her ejection fraction.  3. Transient ischemic attack.  This patient has left-sided numbness in      addition to blurry vision.  Those symptoms are most likely      secondary to her hypertension although stroke would be a      possibility.  CAT scan of the head is normal.  I will try to      control her blood pressure and then follow her symptoms.  If her      symptoms persist after the control of her blood pressure then will      consider doing an MRI.  4. Tobacco abuse.  The patient was advised about the risk.  5. Hypokalemia.  Will give potassium chloride 40 mEq x1 and then will      repeat her potassium tomorrow.  6. History of asthma.  I will continue her ProAir as needed.   Otherwise, other medical conditions remain stable at the present time.  Total assessment time is 1 hour.      Michelene Gardener, MD  Electronically Signed     NAE/MEDQ  D:  08/05/2008  T:  08/05/2008  Job:  [id number]   cc:   Melvern Banker

## 2011-01-03 NOTE — Discharge Summary (Signed)
[name], [name]            ACCOUNT NO.:  [id number]   MEDICAL RECORD NO.:  [id number]          PATIENT TYPE:  INP   LOCATION:  1540                         FACILITY:  [facility]   PHYSICIAN:  Hillery Aldo, M.D.   DATE OF BIRTH:  09-16-1960   DATE OF ADMISSION:  08/05/2008  DATE OF DISCHARGE:  08/07/2008                               DISCHARGE SUMMARY   PRIMARY CARE PHYSICIAN:  Dr. Audria Nine at [facility].   DISCHARGE DIAGNOSES:  1. Malignant hypertension.  2. Transient paresthesias and blurry vision secondary to #1.  3. History of asthma.  4. History of polysubstance abuse.  5. Ongoing tobacco abuse.  6. Transient chest pain.  7. Hypokalemia.  8. Cardiomegaly.  9. Dyslipidemia.  10.Decreased TSH, free T4 within normal limits.  11.Mild normocytic anemia secondary to history of alpha thalassemia      trait.  12.Diastolic dysfunction by two-dimensional echocardiogram.  13.Right adrenal mass, likely benign adenoma however outpatient      evaluation for pheochromocytoma should be considered if patient's      blood pressure remains uncontrolled.   DISCHARGE MEDICATIONS:  1. Norvasc 10 mg p.o. daily.  2. Atenolol 50 mg p.o. b.i.d.  3. Clonidine 0.3 mg b.i.d.  4. Hydrochlorothiazide 25 mg daily.  5. Lisinopril 40 mg daily.  6. Simvastatin 20 mg q.p.m.  7. ProAir HFA 2 puffs q.4 hours p.r.n.   CONSULTATION:  None.   BRIEF ADMISSION HISTORY OF PRESENT ILLNESS:  The patient is a 50-year-  old female who presented to the hospital with a chief complaint of left-  sided paresthesias and blurry vision for 2 days prior to presentation.  Upon initial evaluation in the emergency department, she was have found  to have markedly elevated blood pressures of 201/119.  She was admitted  for treatment of hypertensive urgency and malignant hypertension.  For  the full details, please see the dictated report done by Dr. Arthor Captain.   PROCEDURES AND DIAGNOSTIC STUDIES:  1. Chest x-ray on  August 05, 2008 showed cardiomegaly and mild to      moderate passive vascular congestion with no frank CHF.  2. CT scan of the head on August 05, 2008 was negative.  3. CT angiogram of the abdomen on August 06, 2008 showed no CT      evidence for renal artery stenosis, aortic aneurysm or other      abnormality involving the abdominal vasculature.  Right adrenal      gland mass with imaging characteristics compatible with a benign      adenoma.  Further evaluation for pheochromocytoma as an outpatient      if blood pressure remains poorly controlled.  4. Two-dimensional echocardiogram on August 06, 2008:  Left      ventricular systolic function normal with an ejection fraction      estimated at 60%.  No left ventricular regional wall motion      abnormality.  Left ventricular wall thickness was increased in a      pattern of moderate concentric hypertrophy.  Doppler parameters      were consistent with abnormal left ventricular relaxation.  The  left atrium is mildly dilated.  No TR jet measured, unable to      estimate pulmonary artery systolic pressure.  IVC measured 1.9 cm      with normal respirophasic variation, suggesting RA pressure 6 - 10      mmHg.   DISCHARGE LABORATORY VALUES:  Sodium was 138, potassium 3.8, chloride  100, bicarb 32, BUN 15, creatinine 0.85, glucose 98.   HOSPITAL COURSE BY PROBLEM:  1. Malignant hypertension:  The patient was admitted and put on multi-      modal therapy with several antihypertensives.  Over the course of      48 hours, her blood pressure became better controlled with systolic      pressures in the 140s to 150s over 90s.  A secondary cause of      hypertension evaluation was initiated and renal artery      stenosis/fibromuscular dysplasia was ruled out.  Nevertheless, the      patient would likely benefit from an outpatient obstructive sleep      apnea evaluation.  Consideration can also be made for checking 24-      hour urine  catecholamines to rule out pheochromocytoma although      this is very unlikely.  She will need close follow-up at      [facility] with Dr. Audria Nine and has been instructed to call for      an appointment for hospital follow-up in 1 week.  2. Left-sided paresthesia/blurry vision:  This resolved with blood      pressure control.  The patient had no focal weakness on exam.  Her      CT scan of her head was negative.  3. Asthma:  The patient's lungs were clear throughout her hospital      stay.  4. History of polysubstance abuse:  The patient denied any active use      and a urine drug screen was negative.  5. Ongoing tobacco abuse:  The patient was counseled on the importance      of cessation for better blood pressure control.  6. Transient chest pain:  The patient did have transient chest pain      and cardiac markers were cycled q.8 hours x3 sets.  She also had a      two-dimensional echocardiogram which did not show any evidence of      systolic dysfunction.  7. Hypokalemia:  The patient was appropriately repleted.  8. Cardiomegaly:  Patient was evaluated with a two-dimensional      echocardiogram as noted above.  She needs tighter blood pressure      control.  9. Dyslipidemia:  The patient did have a cholesterol of 195,      triglycerides 89, HDL 37 and LDL 140.  She was started on statin      therapy.  10.Decreased TSH:  The patient's free T4 was normal at 1.06.  She      should have a follow-up TSH and free T4 checked in 6 week's time.  11.Mild normocytic anemia:  The patient has questionable alpha      thalassemia history.  She is status post hysterectomy and there is      no component of acute blood loss related to menses.  Further      outpatient evaluation could be done if deemed necessary by her      primary care physician.   DISPOSITION:  The patient is medically stable and wishes to be  discharged  home.  She should follow up with Dr. Audria Nine in 1 week.       Hillery Aldo, M.D.  Electronically Signed     CR/MEDQ  D:  08/07/2008  T:  08/08/2008  Job:  [id number]   cc:   Maurice March, M.D.  Fax: [phone]

## 2011-01-04 NOTE — Discharge Summary (Signed)
[name], VANDERHEIDEN            ACCOUNT NO.:  [id number]  MEDICAL RECORD NO.:  [id number]           PATIENT TYPE:  I  LOCATION:  4740                         FACILITY:  [facility]  PHYSICIAN:  Hartley Barefoot, MD    DATE OF BIRTH:  [birth year]  DATE OF ADMISSION:  12/28/2010 DATE OF DISCHARGE:  12/30/2010                              DISCHARGE SUMMARY   DISCHARGE DIAGNOSES: 1. Acute diastolic heart failure exacerbation. 2. Malignant hypertension. 3. Chest pressure likely secondary to malignant hypertension. 4. Hypokalemia. 5. Baker cyst.  Other past medical history is 6. History of polysubstance abuse. 7. History of asthma. 8. History of ongoing tobacco abuse. 9. Dyslipidemia. 10.History of anemia, alpha thalassemia trait. 11.Prior history of right adrenal mass, she will need to have follow     up for that.  STUDIES PERFORMED: 1. A 2-D echo performed on Dec 29, 2010, show ejection fraction 65% to     70%, wall motion was normal, no wall motion abnormalities.  Doppler     parameters consistent with abnormal left ventricular relaxation,     grade 1 diastolic dysfunction. 2. Chest x-ray show cardiomegaly without CHF or pneumonia. 3. CT angio show negative for PE or thoracic aortic dissection.  BRIEF HISTORY OF PRESENT ILLNESS:  This is a 50 year old with past medical history of heart failure, hypertension, medication noncompliance, polysubstance abuse, who presents complaining of chest discomfort and some shortness of breath.  The patient ran out of her blood pressure medications.  In the emergency department, blood pressure was 205/130.  HOSPITAL COURSE: 1. Malignant hypertension secondary to medication noncompliance.  Her     medication was changed.  She was started on hydralazine and     isosorbide to help also with her diastolic heart failure.     Hydrochlorothiazide was stopped, and she was started on furosemide     initially 20 mg IV b.i.d., then transitioned to 40 mg  p.o. daily.     We will increase her lisinopril to 40 mg p.o. daily.  She will be     discharged also on atenolol 50 mg p.o. every morning.  She will     need to follow with [facility] to follow potassium level and     compliance with medications.  On the day of discharge, systolic     blood pressure was 137/60. 2. Acute diastolic heart failure exacerbation.  The patient with     shortness of breath on exertion.  After IV Lasix and decreasing her     blood pressure, her shortness of breath has resolved.  A 2-D echo     showed diastolic dysfunction, no wall motion abnormality.  She will     need to follow [facility].  She will be discharged on Lasix 40 mg     p.o. daily and potassium supplement. 3. Baker cyst.  The patient received NSAID for 48 hours.  She can     continue taking Tylenol as needed. 4. Chest pain.  This was likely secondary to malignant hypertension.     Cardiac enzymes x2 negative.  EKG, no ST elevation or inversion.  A  2-D echo no wall motion abnormality.  If she has any other episode,     she will need to be referred to a cardiologist.  Chest pain has     resolved at this time after controlling her blood pressure. 5. Right venous ulcer.  Wound Care saw the patient in the hospital.     We will continue with doxycycline.  She will need to follow with     her wound care clinic on Monday as already arranged.  On the day of discharge, the patient was in good condition.  Systolic blood pressure was 132/78, temperature 98.3, pulse 64, respirations 18, sat 100% on room air.  LABORATORY DATA:  Hemoglobin 11.8, white blood cell 3.7, platelet 273. Sodium 139, potassium 3.0, chloride 97, bicarb 32, BUN 19, creatinine 0.7.  Potassium will be repleted.  The patient is going to be discharged in good improved condition.     Hartley Barefoot, MD     BR/MEDQ  D:  12/30/2010  T:  12/30/2010  Job:  [id number]  Electronically Signed by Hartley Barefoot MD on 01/04/2011  12:11:19 PM

## 2011-01-06 NOTE — Op Note (Signed)
[name], [name] NO.:  [id number]   MEDICAL RECORD NO.:  [id number]                   PATIENT TYPE:  INP   LOCATION:  0448                                 FACILITY:  [facility]   PHYSICIAN:  Zenaida Niece, M.D.             DATE OF BIRTH:  24-Sep-1960   DATE OF PROCEDURE:  05/05/2004  DATE OF DISCHARGE:                                 OPERATIVE REPORT   PREOPERATIVE DIAGNOSES:  1.  Menorrhagia.  2.  Anemia.  3.  Uterine leiomyomas.   POSTOPERATIVE DIAGNOSES:  1.  Menorrhagia.  2.  Anemia.  3.  Uterine leiomyomas.   PROCEDURE:  Total abdominal hysterectomy.   SURGEON:  Lavina Hamman, M.D.   ASSISTANT:  Tracey Harries, M.D.   ANESTHESIA:  General endotracheal tube.   ESTIMATED BLOOD LOSS:  150 mL.   FINDINGS:  Twelve weeks' size uterus with palpable uterine myomas with  normal tubes and ovaries.   PROCEDURE IN DETAIL:  The patient was taken to the operating room and placed  in the dorsal supine position.  General anesthesia was induced.  Abdomen and  vagina were prepped and draped in the usual sterile fashion and a Foley  catheter inserted.  Her abdomen was then entered via a standard Pfannenstiel  incision approximately 2 cm above her prior incision which was right at the  top of her pubic symphysis.  A self-retaining retractor was placed and  bowels packed out of the pelvis.  Uterus was quite large but symmetric.  Uterine cornua were grasped with Kelly clamps.  Both round ligaments were  divided with electrocautery, and the anterior leaf of the broad ligament was  incised across the anterior portion of the uterus with electrocautery.  Both  uteroovarian pedicles were clamped, transected, and doubly ligated with #1  chromic after a window had been made in an avascular portion of the  posterior broad ligament.  Uterine arteries were then skeletonized and  clamped with Zepplin clamps after the bladder was pushed inferior with sharp  and  blunt dissection.  Uterine arteries were clamped, transected, and  ligated with #1 chromic.  Cardinal ligaments and uterosacral ligaments were  clamped, transected, and ligated on each side with #1 chromic.  One more  bite on each side was taken inferior to the uterosacral ligaments.  The  vaginal angle was then grasped on the patient's right side, and the vagina  was entered.  This pedicle was then sutured with #1 chromic and tied for  later use.  The remainder of the cervix and uterus was removed sharply.  The  left vaginal angle was sutured with #1 chromic and tagged for use.  The  remainder of the vagina was closed with running #1 chromic in a running  fashion.  A small defect in the mucosa on the patient's left side along with  some bleeding was controlled with a figure-of-eight suture of #1 chromic.  Both  tubes and ovaries were inspected.  Some bleeding from the insertion of  where the broad ligament was on the right side was controlled with running  locking #1 chromic with adequate hemostasis.  The ureters were palpable  below incisions.  The uterosacral ligaments were plicated in the midline  with 0 silk.  Small bleeding from serosal edges was controlled with  electrocautery.  The bladder appeared to be well inferior to the vaginal  incision.  The pelvis was irrigated and found to be hemostatic.  All packs  and the retractor were removed from the abdomen.  The upper portion of the  rectus muscles was closed with interrupted sutures of #1 chromic to prevent  the bowel from entering the operative field.  Fascia was then closed in a  running fashion, starting at both ends and meeting in the middle after the  subfascial space was made hemostatic with electrocautery.  Subcutaneous  tissue was then irrigated and made hemostatic with electrocautery.  Skin was  then closed with staples and a sterile dressing.  The patient tolerated the  procedure well and was taken to the recovery room in  stable condition.  Counts were correct x 2.  The patient received Ancef 1 g prior to the  procedure; she had PAS hose on throughout the procedure.                                               Zenaida Niece, M.D.    TDM/MEDQ  D:  05/05/2004  T:  05/05/2004  Job:  [id number]

## 2011-01-23 ENCOUNTER — Encounter (HOSPITAL_BASED_OUTPATIENT_CLINIC_OR_DEPARTMENT_OTHER): Payer: Self-pay | Attending: Internal Medicine

## 2011-01-23 DIAGNOSIS — L02419 Cutaneous abscess of limb, unspecified: Secondary | ICD-10-CM | POA: Insufficient documentation

## 2011-01-23 DIAGNOSIS — Z79899 Other long term (current) drug therapy: Secondary | ICD-10-CM | POA: Insufficient documentation

## 2011-01-23 DIAGNOSIS — A498 Other bacterial infections of unspecified site: Secondary | ICD-10-CM | POA: Insufficient documentation

## 2011-01-23 DIAGNOSIS — I872 Venous insufficiency (chronic) (peripheral): Secondary | ICD-10-CM | POA: Insufficient documentation

## 2011-01-23 DIAGNOSIS — I10 Essential (primary) hypertension: Secondary | ICD-10-CM | POA: Insufficient documentation

## 2011-01-23 DIAGNOSIS — L03119 Cellulitis of unspecified part of limb: Secondary | ICD-10-CM | POA: Insufficient documentation

## 2011-01-23 DIAGNOSIS — L97809 Non-pressure chronic ulcer of other part of unspecified lower leg with unspecified severity: Secondary | ICD-10-CM | POA: Insufficient documentation

## 2011-01-23 DIAGNOSIS — A4901 Methicillin susceptible Staphylococcus aureus infection, unspecified site: Secondary | ICD-10-CM | POA: Insufficient documentation

## 2011-02-20 ENCOUNTER — Encounter (HOSPITAL_BASED_OUTPATIENT_CLINIC_OR_DEPARTMENT_OTHER): Payer: Self-pay | Attending: Internal Medicine

## 2011-02-20 DIAGNOSIS — L97809 Non-pressure chronic ulcer of other part of unspecified lower leg with unspecified severity: Secondary | ICD-10-CM | POA: Insufficient documentation

## 2011-03-27 ENCOUNTER — Encounter (HOSPITAL_BASED_OUTPATIENT_CLINIC_OR_DEPARTMENT_OTHER): Payer: Self-pay

## 2011-05-16 LAB — CBC
HCT: 35.8 — ABNORMAL LOW
Platelets: 266
WBC: 6.6

## 2011-05-16 LAB — POCT I-STAT, CHEM 8
BUN: 20
Chloride: 101
Creatinine, Ser: 1.2
Potassium: 3.3 — ABNORMAL LOW
Sodium: 139
TCO2: 28

## 2011-05-16 LAB — DIFFERENTIAL
Eosinophils Relative: 1
Lymphocytes Relative: 28
Lymphs Abs: 1.8

## 2011-05-25 LAB — URINALYSIS, ROUTINE W REFLEX MICROSCOPIC
Ketones, ur: NEGATIVE mg/dL
Protein, ur: NEGATIVE mg/dL
Urobilinogen, UA: 0.2 mg/dL (ref 0.0–1.0)

## 2011-05-25 LAB — POCT I-STAT, CHEM 8
BUN: 14 mg/dL (ref 6–23)
Chloride: 102 mEq/L (ref 96–112)
Creatinine, Ser: 0.8 mg/dL (ref 0.4–1.2)
Glucose, Bld: 96 mg/dL (ref 70–99)
Potassium: 3.4 mEq/L — ABNORMAL LOW (ref 3.5–5.1)

## 2011-05-25 LAB — POCT CARDIAC MARKERS
CKMB, poc: 1.5 ng/mL (ref 1.0–8.0)
Myoglobin, poc: 69.6 ng/mL (ref 12–200)

## 2011-05-26 LAB — CARDIAC PANEL(CRET KIN+CKTOT+MB+TROPI)
Relative Index: INVALID (ref 0.0–2.5)
Relative Index: INVALID (ref 0.0–2.5)
Total CK: 87 U/L (ref 7–177)
Troponin I: <0.01 ng/mL (ref 0.00–0.06)
Troponin I: <0.01 ng/mL (ref 0.00–0.06)
Troponin I: <0.01 ng/mL (ref 0.00–0.06)

## 2011-05-26 LAB — CBC
Platelets: 240 10*3/uL (ref 150–400)
WBC: 8.2 10*3/uL (ref 4.0–10.5)

## 2011-05-26 LAB — BASIC METABOLIC PANEL
BUN: 11 mg/dL (ref 6–23)
Calcium: 8.9 mg/dL (ref 8.4–10.5)
Chloride: 100 mEq/L (ref 96–112)
Creatinine, Ser: 0.85 mg/dL (ref 0.4–1.2)
GFR calc Af Amer: >60 mL/min (ref >60)
GFR calc non Af Amer: >60 mL/min (ref >60)
Potassium: 3.8 mEq/L (ref 3.5–5.1)
Sodium: 138 mEq/L (ref 135–145)
Sodium: 138 mEq/L (ref 135–145)

## 2011-05-26 LAB — RAPID URINE DRUG SCREEN, HOSP PERFORMED
Amphetamines: NOT DETECTED
Benzodiazepines: NOT DETECTED
Cocaine: NOT DETECTED

## 2011-05-26 LAB — MAGNESIUM: Magnesium: 1.9 mg/dL (ref 1.5–2.5)

## 2011-05-26 LAB — PHOSPHORUS: Phosphorus: 3.6 mg/dL (ref 2.3–4.6)

## 2011-05-26 LAB — LIPID PANEL
Cholesterol: 195 mg/dL (ref 0–200)
LDL Cholesterol: 140 mg/dL — ABNORMAL HIGH (ref 0–99)
VLDL: 18 mg/dL (ref 0–40)

## 2011-05-26 LAB — TSH: TSH: 0.229 u[IU]/mL — ABNORMAL LOW (ref 0.350–4.500)

## 2011-05-29 LAB — DIFFERENTIAL
Basophils Relative: 0
Eosinophils Absolute: 0 — ABNORMAL LOW
Lymphs Abs: 2.2
Monocytes Relative: 6
Neutro Abs: 3.3
Neutrophils Relative %: 56

## 2011-05-29 LAB — URINALYSIS, ROUTINE W REFLEX MICROSCOPIC
Leukocytes, UA: NEGATIVE
Nitrite: NEGATIVE
Specific Gravity, Urine: 1.024
Urobilinogen, UA: 1
pH: 6

## 2011-05-29 LAB — I-STAT 8, (EC8 V) (CONVERTED LAB)
BUN: 15
Bicarbonate: 27.8 — ABNORMAL HIGH
Chloride: 105
Glucose, Bld: 99
HCT: 39
Operator id: 234501
pCO2, Ven: 46.5
pH, Ven: 7.384 — ABNORMAL HIGH

## 2011-05-29 LAB — CBC
MCHC: 32.6
Platelets: 245
RBC: 3.94
WBC: 5.9

## 2011-05-29 LAB — URINE MICROSCOPIC-ADD ON

## 2011-05-29 LAB — POCT CARDIAC MARKERS
Myoglobin, poc: 56.2
Operator id: 234501

## 2011-05-29 LAB — RAPID URINE DRUG SCREEN, HOSP PERFORMED
Amphetamines: NOT DETECTED
Opiates: NOT DETECTED
Tetrahydrocannabinol: NOT DETECTED

## 2011-05-29 LAB — D-DIMER, QUANTITATIVE: D-Dimer, Quant: <0.22

## 2011-05-29 LAB — POCT I-STAT CREATININE
Creatinine, Ser: 1
Operator id: 234501

## 2011-11-11 ENCOUNTER — Other Ambulatory Visit: Payer: Self-pay

## 2011-11-11 ENCOUNTER — Emergency Department (HOSPITAL_COMMUNITY): Payer: Self-pay

## 2011-11-11 ENCOUNTER — Emergency Department (HOSPITAL_COMMUNITY)
Admission: EM | Admit: 2011-11-11 | Discharge: 2011-11-11 | Disposition: A | Discharge status: Home / Self Care | Payer: Self-pay | Attending: Emergency Medicine | Admitting: Emergency Medicine

## 2011-11-11 ENCOUNTER — Encounter (HOSPITAL_COMMUNITY): Payer: Self-pay | Admitting: Emergency Medicine

## 2011-11-11 DIAGNOSIS — R42 Dizziness and giddiness: Secondary | ICD-10-CM | POA: Insufficient documentation

## 2011-11-11 DIAGNOSIS — F17200 Nicotine dependence, unspecified, uncomplicated: Secondary | ICD-10-CM | POA: Insufficient documentation

## 2011-11-11 DIAGNOSIS — E785 Hyperlipidemia, unspecified: Secondary | ICD-10-CM | POA: Insufficient documentation

## 2011-11-11 DIAGNOSIS — J45909 Unspecified asthma, uncomplicated: Secondary | ICD-10-CM | POA: Insufficient documentation

## 2011-11-11 DIAGNOSIS — I10 Essential (primary) hypertension: Secondary | ICD-10-CM

## 2011-11-11 DIAGNOSIS — I83009 Varicose veins of unspecified lower extremity with ulcer of unspecified site: Secondary | ICD-10-CM

## 2011-11-11 DIAGNOSIS — Z79899 Other long term (current) drug therapy: Secondary | ICD-10-CM | POA: Insufficient documentation

## 2011-11-11 HISTORY — DX: Hypokalemia: E87.6

## 2011-11-11 HISTORY — DX: Hyperlipidemia, unspecified: E78.5

## 2011-11-11 HISTORY — DX: Essential (primary) hypertension: I10

## 2011-11-11 HISTORY — DX: Varicose veins of unspecified lower extremity with ulcer of unspecified site: I83.009

## 2011-11-11 HISTORY — DX: Varicose veins of unspecified lower extremity with ulcer of unspecified site: L97.909

## 2011-11-11 HISTORY — DX: Anemia, unspecified: D64.9

## 2011-11-11 LAB — URINALYSIS, ROUTINE W REFLEX MICROSCOPIC
Bilirubin Urine: NEGATIVE
Glucose, UA: NEGATIVE mg/dL
Hgb urine dipstick: NEGATIVE
Nitrite: NEGATIVE
Specific Gravity, Urine: 1.02 (ref 1.005–1.030)
pH: 7 (ref 5.0–8.0)

## 2011-11-11 LAB — BASIC METABOLIC PANEL
Chloride: 99 mEq/L (ref 96–112)
GFR calc Af Amer: >90 mL/min (ref >90)
GFR calc non Af Amer: >90 mL/min (ref >90)
Glucose, Bld: 88 mg/dL (ref 70–99)
Potassium: 3.9 mEq/L (ref 3.5–5.1)
Sodium: 139 mEq/L (ref 135–145)

## 2011-11-11 LAB — CBC
HCT: 36.3 % (ref 36.0–46.0)
Hemoglobin: 11.7 g/dL — ABNORMAL LOW (ref 12.0–15.0)
MCHC: 32.2 g/dL (ref 30.0–36.0)
WBC: 7.8 10*3/uL (ref 4.0–10.5)

## 2011-11-11 LAB — URINE MICROSCOPIC-ADD ON

## 2011-11-11 MED ORDER — ATENOLOL-CHLORTHALIDONE 50-25 MG PO TABS: 1.0000 | ORAL_TABLET | Freq: Every day | ORAL | Status: DC

## 2011-11-11 MED ORDER — TRAMADOL HCL 50 MG PO TABS: 50.0000 mg | ORAL_TABLET | Freq: Four times a day (QID) | ORAL | Status: AC | PRN

## 2011-11-11 MED ORDER — POTASSIUM CHLORIDE CRYS ER 20 MEQ PO TBCR: 20.0000 meq | EXTENDED_RELEASE_TABLET | Freq: Every day | ORAL | Status: DC

## 2011-11-11 MED ORDER — LISINOPRIL 20 MG PO TABS: 20.0000 mg | ORAL_TABLET | Freq: Every day | ORAL | Status: DC

## 2011-11-11 MED ORDER — CLONIDINE HCL 0.2 MG PO TABS: 0.2000 mg | ORAL_TABLET | Freq: Two times a day (BID) | ORAL | Status: DC

## 2011-11-11 MED ORDER — CLONIDINE HCL 0.1 MG PO TABS
0.2000 mg | ORAL_TABLET | Freq: Two times a day (BID) | ORAL | Status: DC
  Administered 2011-11-11: 0.2 mg via ORAL
  Filled 2011-11-11: qty 2

## 2011-11-11 NOTE — ED Provider Notes (Signed)
History     CSN: [id number]  Arrival date & time 11/11/11  0700   First MD Initiated Contact with Patient 11/11/11 [phone]      Chief Complaint  Patient presents with  . Hypertension    (Consider location/radiation/quality/duration/timing/severity/associated sxs/prior treatment) Patient is a 51 y.o. female presenting with hypertension. The history is provided by the patient.  Hypertension This is a chronic problem.  Pt reports she ran out of her blood pressure medications 3 weeks ago. Has not been able to get an appointment at [facility] for refills. Has had associated blurred vision intermittently for the last 3 days. Denies any HA, CP, SOB, N/V, dizziness, weakness, or numbness. Nothing makes her symptoms better or worse.  In addition, patient c/o ulcers to bilateral ankles x 1-2 months, gradual onset, assoc with chronic BLE swelling. They are not associated with any injury. She does have a history of venous statis ulcers and although she was previously seen at the wound care clinic, they released her on December as her ulcers had all resolved. No known aggravating or alleviating factors. She has been wearing TED hose at work as she is on her feet all day. She has also been applying bacitracin and silvadene creme without improvement.  Past Medical History  Diagnosis Date  . Hypertension   . Venous stasis ulcers   . Diastolic heart failure   . Hypokalemia   . Asthma   . Dyslipidemia   . Anemia     History reviewed. No pertinent past surgical history.  No family history on file.  History  Substance Use Topics  . Smoking status: Current Everyday Smoker -- 0.5 packs/day    Types: Cigarettes  . Smokeless tobacco: Not on file  . Alcohol Use: 1.2 oz/week    2 Cans of beer per week     Review of Systems 10 systems reviewed and are negative for acute change except as noted in the HPI.  Allergies  Review of patient's allergies indicates no known allergies.  Home Medications     Current Outpatient Rx  Name Route Sig Dispense Refill  . ACETAMINOPHEN 325 MG PO TABS Oral Take 650 mg by mouth every 6 (six) hours as needed. pain    . ATENOLOL-CHLORTHALIDONE 50-25 MG PO TABS Oral Take 1 tablet by mouth daily.    Marland Kitchen BACITRACIN 500 UNIT/GM EX OINT Topical Apply 1 application topically 2 (two) times daily. Apply to open sores on lower legs    . CLONIDINE HCL 0.2 MG PO TABS Oral Take 0.2 mg by mouth 2 (two) times daily.    . IBUPROFEN 200 MG PO TABS Oral Take 800 mg by mouth every 6 (six) hours as needed. pain    . LISINOPRIL 20 MG PO TABS Oral Take 20 mg by mouth daily.    Marland Kitchen POTASSIUM CHLORIDE CRYS ER 20 MEQ PO TBCR Oral Take 20 mEq by mouth daily.    Marland Kitchen SILVER SULFADIAZINE 1 % EX CREA Topical Apply 1 application topically daily. Apply to sores/inflammation on lower legs      BP 202/127  Pulse 88  Temp(Src) 98.9 F (37.2 C) (Oral)  Resp 22  SpO2 100%  Physical Exam  Nursing note reviewed. Constitutional: She is oriented to person, place, and time. She appears well-developed and well-nourished. No distress.       VS reviewed, sig for HTN  HENT:  Head: Normocephalic and atraumatic.  Right Ear: External ear normal.  Left Ear: External ear normal.  Mouth/Throat: Oropharynx is  clear and moist.  Eyes: Conjunctivae and EOM are normal. Pupils are equal, round, and reactive to light.       Visual fields full  Neck: Normal range of motion. Neck supple.  Pulmonary/Chest: Effort normal and breath sounds normal. No respiratory distress. She has no wheezes. She exhibits no tenderness.  Abdominal: Soft. Bowel sounds are normal. She exhibits no distension. There is no tenderness.  Musculoskeletal: Normal range of motion. She exhibits edema. She exhibits no tenderness.  Neurological: She is alert and oriented to person, place, and time. No cranial nerve deficit. Coordination normal.  Skin: Skin is warm and dry.       Venous stasis ulcer to lateral aspect of bilateral lower legs.  On right, 3cm in diameter. On left, 1.5cm in diameter. No purulent drainage or surrounding erythema.  Psychiatric: She has a normal mood and affect.    ED Course  Procedures (including critical care time)  Labs Reviewed  CBC - Abnormal; Notable for the following:    Hemoglobin 11.7 (*)    All other components within normal limits  URINALYSIS, ROUTINE W REFLEX MICROSCOPIC - Abnormal; Notable for the following:    APPearance CLOUDY (*)    Protein, ur 100 (*)    All other components within normal limits  URINE MICROSCOPIC-ADD ON - Abnormal; Notable for the following:    Squamous Epithelial / LPF FEW (*)    Bacteria, UA FEW (*)    All other components within normal limits  BASIC METABOLIC PANEL  TROPONIN I  URINALYSIS, ROUTINE W REFLEX MICROSCOPIC   Dg Chest 2 View  11/11/2011  *RADIOLOGY REPORT*  Clinical Data: Shortness of breath  CHEST - 2 VIEW  Comparison: 12/28/2010  Findings: Lungs are essentially clear. No pleural effusion or pneumothorax.  Stable mild cardiomegaly.  Degenerative changes of the visualized thoracolumbar spine.  IMPRESSION: No evidence of acute cardiopulmonary disease.  Stable mild cardiomegaly.  Original Report Authenticated By: Charline Bills, M.D.   Ct Head Wo Contrast  11/11/2011  *RADIOLOGY REPORT*  Clinical Data: Dizziness, blurred vision  CT HEAD WITHOUT CONTRAST  Technique:  Contiguous axial images were obtained from the base of the skull through the vertex without contrast.  Comparison: 08/05/2008  Findings: No evidence of parenchymal hemorrhage or extra-axial fluid collection. No mass lesion, mass effect, or midline shift.  No CT evidence of acute infarction.  Cerebral volume is age appropriate.  No ventriculomegaly.  The visualized paranasal sinuses are essentially clear. The mastoid air cells are unopacified.  No evidence of calvarial fracture.  IMPRESSION: Normal head CT.  Original Report Authenticated By: Charline Bills, M.D.     1. HTN  (hypertension)   2. Venous stasis ulcers       MDM  Patient with known hypertension, out of chronic medications. Significant hypertension on arrival, improved with administration of home dose of clonidine. Labs were slight anemia. Proteinuria, of which patient has a history. Renal function is currently preserved. EKG is unremarkable- please see attending note for analysis. I will refill her chronic medications and have discussed with her at length the need to followup with her regular doctor for continued management.  Patient also with venous stasis ulcers as per her history. No indication on today's examination of wound infection. I discussed with the patient that she should followup with the wound clinic for management.  The patient voices understanding of the plan as laid out above. Family in room also voices understanding.       Shaaron Adler,  PA-C 11/11/11 1206

## 2011-11-11 NOTE — ED Notes (Signed)
Patient received on 0.2mg  Clonidine.

## 2011-11-11 NOTE — ED Notes (Signed)
Pt. Stated, I'm here cause my BP is up, i have 2 ulcers on each lower leg and I have some back pain.  I've been ou of my BP meds for a week cause I've not been able to go to [facility] to get [facility] medicine [facility] and I was going to the wound clinic for the ulcer on my rt. Leg but its gotten worse

## 2011-11-11 NOTE — ED Notes (Signed)
Printed two old ekgs from [facility]. 

## 2011-11-11 NOTE — Discharge Instructions (Signed)
As we discussed, you need to monitor your blood pressure closely and take your medications as directed to prevent further health problems. Return to the ER if needed for new or worsening symptoms  Please see the wound clinic for management of your venous stasis ulcers.     Hypertension As your heart beats, it forces blood through your arteries. This force is your blood pressure. If the pressure is too high, it is called hypertension (HTN) or high blood pressure. HTN is dangerous because you may have it and not know it. High blood pressure may mean that your heart has to work harder to pump blood. Your arteries may be narrow or stiff. The extra work puts you at risk for heart disease, stroke, and other problems.  Blood pressure consists of two numbers, a higher number over a lower, 110/72, for example. It is stated as "110 over 72." The ideal is below 120 for the top number (systolic) and under 80 for the bottom (diastolic). Write down your blood pressure today. You should pay close attention to your blood pressure if you have certain conditions such as:  Heart failure.   Prior heart attack.   Diabetes   Chronic kidney disease.   Prior stroke.   Multiple risk factors for heart disease.  To see if you have HTN, your blood pressure should be measured while you are seated with your arm held at the level of the heart. It should be measured at least twice. A one-time elevated blood pressure reading (especially in the Emergency Department) does not mean that you need treatment. There may be conditions in which the blood pressure is different between your right and left arms. It is important to see your caregiver soon for a recheck. Most people have essential hypertension which means that there is not a specific cause. This type of high blood pressure may be lowered by changing lifestyle factors such as:  Stress.   Smoking.   Lack of exercise.   Excessive weight.   Drug/tobacco/alcohol use.    Eating less salt.  Most people do not have symptoms from high blood pressure until it has caused damage to the body. Effective treatment can often prevent, delay or reduce that damage. TREATMENT  When a cause has been identified, treatment for high blood pressure is directed at the cause. There are a large number of medications to treat HTN. These fall into several categories, and your caregiver will help you select the medicines that are best for you. Medications may have side effects. You should review side effects with your caregiver. If your blood pressure stays high after you have made lifestyle changes or started on medicines,   Your medication(s) may need to be changed.   Other problems may need to be addressed.   Be certain you understand your prescriptions, and know how and when to take your medicine.   Be sure to follow up with your caregiver within the time frame advised (usually within two weeks) to have your blood pressure rechecked and to review your medications.   If you are taking more than one medicine to lower your blood pressure, make sure you know how and at what times they should be taken. Taking two medicines at the same time can result in blood pressure that is too low.  SEEK IMMEDIATE MEDICAL CARE IF:  You develop a severe headache, blurred or changing vision, or confusion.   You have unusual weakness or numbness, or a faint feeling.   You  have severe chest or abdominal pain, vomiting, or breathing problems.  MAKE SURE YOU:   Understand these instructions.   Will watch your condition.   Will get help right away if you are not doing well or get worse.  Document Released: 08/07/2005 Document Revised: 07/27/2011 Document Reviewed: 03/27/2008 [facility] Patient Information 2012 [place], [place].         Stasis Ulcer Stasis ulcers occur in the legs when the circulation is damaged. An ulcer may look like a small hole in the skin.  CAUSES Stasis ulcers  occur because your veins do not work properly. Veins have valves that help the blood return to the heart. If these valves do not work right, blood flows backwards and backs up into the veins near the skin. This condition causes the veins to become larger because of increased pressure and may lead to a stasis ulcer. SYMPTOMS   Shallow (superficial) sore on the leg.   Clear drainage or weeping from the sore.   Leg pain or a feeling of heaviness. This may be worse at the end of the day.   Leg swelling.   Skin color changes.  DIAGNOSIS  Your caregiver will make a diagnosis by examining your leg. Your caregiver may order tests such as an ultrasound or other studies to evaluate the blood flow of the leg. HOME CARE INSTRUCTIONS   Do not stand or sit in one position for long periods of time. Do not sit with your legs crossed. Rest with your legs raised during the day. If possible, it is best if you can elevate your legs above your heart for 30 minutes, 3 to 4 times a day.   Wear elastic stockings or support hose. Do not wear other tight encircling garments around legs, pelvis, or waist. This causes increased pressure in your veins. If your caregiver has applied compressive medicated wraps, use them as instructed.   Walk as much as possible to increase blood flow. If you are taking long rides in a car or plane, take a break to walk around every 2 hours. If not already on aspirin, take a baby aspirin before long trips unless you have medical reasons that prohibit this.   Raise the foot of your bed at night with 2-inch blocks if approved by your caregiver. This may not be desirable if you have heart failure or breathing problems.   If you get a cut in the skin over the vein and the vein bleeds, lie down with your leg raised and gently clean the area with a clean cloth. Apply pressure on the cut until the bleeding stops. Then place a dressing on the cut. See your caregiver if it continues to bleed or  needs stitches. Also, see your caregiver if you develop an infection.Signs of an infection include a fever, redness, increased pain, and drainage of pus.   If your caregiver has given you a follow-up appointment, it is very important to keep that appointment. Not keeping the appointment could result in a chronic or permanent injury, pain, and disability. If there is any problem keeping the appointment, call your caregiver for assistance.  SEEK IMMEDIATE MEDICAL CARE IF:  The ulcer area starts to break down.   You have pain, redness, tenderness, pus, or hard swelling in your leg over a vein or near the ulcer.   Your leg pain is uncomfortable.   You develop an unexplained fever.   You develop chest pain or shortness of breath.  Document Released: 05/02/2001 Document  Revised: 07/27/2011 Document Reviewed: 11/27/2010 [facility] Patient Information 2012 [place], [place].

## 2011-11-11 NOTE — ED Notes (Signed)
Patient returned from radiology, confirmed CT head order and patient immediately returned to radiology for CT scan.

## 2011-11-11 NOTE — ED Notes (Signed)
Patient transported to CT 

## 2011-11-11 NOTE — ED Provider Notes (Signed)
Medical screening examination/treatment/procedure(s) were conducted as a shared visit with non-physician practitioner(s) and myself.  I personally evaluated the patient during the encounter 51 year old woman with hypertension who ran out of her meds three weeks ago.  Exam shows her to be in no distress.  Heart and lungs normal, neuro physiological.  Lab workup showed mild proteinuria, otherwise was negative.  Discussed complications of hypertension with pt, I.E. Risk of stroke, heart attack, or renal failure.  She should take her antihypertensive meds every day and have followup with her PCP at regular intervals to monitor her progress.  Carleene Cooper III, MD 11/11/11 2041

## 2011-11-11 NOTE — ED Notes (Signed)
Family at bedside. 

## 2011-11-11 NOTE — ED Notes (Signed)
Patient c/o RLE ulcers x1 year, intermittently.  "legs will heal then in one month its back".  LLE ulcer x2 weeks.  Patient states saw MD in December 2012 at wound clinic.  Patient unable to get appointment with [facility].  Patient states needs to call and make appointment at wound clinic.  Patient states Ibuprofen and Tylenol OTC not helping pain meds any longer,. Bilateral leg ulcerations have yellow drainage. Bilateral pedal pulses palpable and strong, mild edema bilateral lower ext.

## 2011-11-11 NOTE — ED Notes (Signed)
Patient transported to X-ray 

## 2011-11-11 NOTE — ED Provider Notes (Signed)
Date: 11/11/2011  Rate: 73  Rhythm: normal sinus rhythm  QRS Axis: left  Intervals: normal QRS:  Left ventricular hypertrophy  ST/T Wave abnormalities: normal  Conduction Disutrbances:none  Narrative Interpretation: Abnormal EKG  Old EKG Reviewed: changes noted--had first degree AV block on 12/28/2010.      Carleene Cooper III, MD 11/11/11 [phone]

## 2011-11-20 ENCOUNTER — Encounter (HOSPITAL_BASED_OUTPATIENT_CLINIC_OR_DEPARTMENT_OTHER): Payer: Self-pay | Attending: Internal Medicine

## 2011-11-20 DIAGNOSIS — I872 Venous insufficiency (chronic) (peripheral): Secondary | ICD-10-CM | POA: Insufficient documentation

## 2011-11-20 DIAGNOSIS — L97309 Non-pressure chronic ulcer of unspecified ankle with unspecified severity: Secondary | ICD-10-CM | POA: Insufficient documentation

## 2011-11-20 DIAGNOSIS — L97209 Non-pressure chronic ulcer of unspecified calf with unspecified severity: Secondary | ICD-10-CM | POA: Insufficient documentation

## 2011-11-20 DIAGNOSIS — Z79899 Other long term (current) drug therapy: Secondary | ICD-10-CM | POA: Insufficient documentation

## 2011-11-20 NOTE — Progress Notes (Signed)
[facility]  [name], [name]            ACCOUNT NO.:  [id number]  MEDICAL RECORD NO.:  [id number]      DATE OF BIRTH:  1960-11-09  PHYSICIAN:  Jonelle Sports. Annaliah Rivenbark, M.D.  VISIT DATE:  11/20/2011                                  OFFICE VISIT   HISTORY:  This 51 year old black female, who is previously known to this clinic with chronic venous insufficiency and with prior stasis ulcerations.  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  She says that she has been faithful in wearing these but approximately a month ago, had recurrence of ulcerations laterally on both of her lower extremities.  She just treated with topical ointment until she apparently required hospitalization with blood pressure out of control because of failure to take her medication.  During that hospitalization, which ended 2 days ago, she was given limited care to these ulcers and  referral was established here for our ongoing treatment at this time.  PAST MEDICAL HISTORY:  Unchanged since the time she was last seen here except for the aforementioned hospitalization.  MEDICATIONS:  She has had no real change in the medications which include: 1. Clonidine 0.2 mg b.i.d. 2. Lisinopril 20 mg daily. 3. Atenolol/chlorthalidone 50/25 one daily. 4. Potassium chloride 20 mEq daily. 5. Tramadol 50 mg every 6 hours as needed for pain. 6. She also takes ibuprofen 800 mg p.r.n.  ALLERGIES:  She has no known medicinal allergies.  EXAMINATION:  VITAL SIGNS: Today, her blood pressure is 137/88, her pulse 80, her respirations 16,  her temperature 98.2, and she is 5 feet 8 inches tall, weighs 213 pounds. SKIN: Warm and dry.  Her mucous membranes are moist and pink. GENERAL:  Examination was not undertaken. EXTREMITIES:  The lower extremities show pulses palpable bilaterally, some  chronic edema and chronic stasis change  distal to the knees bilaterally.  On the lateral aspect of her calves bilaterally, she has ulcerations.  Then, on the right measures 2.8 x 2.5 x 0.1 cm.  Has some soft slough in the base and some accumulated crusting from drainage circumferentially around the ulcer.  The left ankle ulcer has a similar appearance with slightly more tenacious slough at its base and its measurements are 1.8 x 1.5 x 0.2 cm.  IMPRESSION:  Bilateral venous stasis ulcerations.  DISPOSITION:  Wounds are debrided of the slough with the use of a curette and also #15 scalpel.  Care was taken also to remove the accumulated encrustations around the margins of the wound.  Following this, both wounds were dressed with an application of collagen, and she is placed in Unna wraps bilaterally.  Note that the plan will be to get her for evaluation in the [facility]. Once these wounds are healed, to see if there is a possibility of some intervention that might prevent future problems.  It is noted also the patient complains significantly of pain, but I have told her I am reluctant to add anything beyond the tramadol which she already has.  She is given a note that recommends she stay off work and at the same time, I have given her the advice to  stay off her feet as much as possible for the next week until she is seen here again for re- evaluation in 1 week.          ______________________________ Jonelle Sports. Cheryll Cockayne, M.D.     RES/MEDQ  D:  11/20/2011  T:  11/20/2011  Job:  [id number]

## 2011-12-25 ENCOUNTER — Encounter (HOSPITAL_BASED_OUTPATIENT_CLINIC_OR_DEPARTMENT_OTHER): Payer: Self-pay

## 2012-12-05 ENCOUNTER — Encounter (HOSPITAL_BASED_OUTPATIENT_CLINIC_OR_DEPARTMENT_OTHER): Payer: Self-pay | Attending: Internal Medicine

## 2012-12-05 DIAGNOSIS — E785 Hyperlipidemia, unspecified: Secondary | ICD-10-CM | POA: Insufficient documentation

## 2012-12-05 DIAGNOSIS — I872 Venous insufficiency (chronic) (peripheral): Secondary | ICD-10-CM | POA: Insufficient documentation

## 2012-12-05 DIAGNOSIS — Z79899 Other long term (current) drug therapy: Secondary | ICD-10-CM | POA: Insufficient documentation

## 2012-12-05 DIAGNOSIS — I10 Essential (primary) hypertension: Secondary | ICD-10-CM | POA: Insufficient documentation

## 2012-12-05 DIAGNOSIS — R609 Edema, unspecified: Secondary | ICD-10-CM | POA: Insufficient documentation

## 2012-12-05 DIAGNOSIS — F411 Generalized anxiety disorder: Secondary | ICD-10-CM | POA: Insufficient documentation

## 2012-12-05 DIAGNOSIS — F17200 Nicotine dependence, unspecified, uncomplicated: Secondary | ICD-10-CM | POA: Insufficient documentation

## 2012-12-06 NOTE — Progress Notes (Signed)
[facility]  NAME:  [name], [name]            ACCOUNT NO.:  [id number]  MEDICAL RECORD NO.:  [id number]      DATE OF BIRTH:  Jul 14, 1961  PHYSICIAN:  Maxwell Caul, M.D.      VISIT DATE:                                  OFFICE VISIT   [name] arrives for review of wound on her right lateral leg which she states has been there for a week.  There is no real precipitating factor here.  She simply noted pain and the wound.  She had been here in April 2013, for a wound in roughly the same area.  She has a history of chronic venous insufficiency with prior stasis ulcerations.  She works on her feet, is an attendant in an assisted living.  I believe she had also been seen here in July 2012.  She has compression stockings, and has been wearing them faithfully even when this occurred.  PAST MEDICAL HISTORY: 1. Hypertension, complicated by difficulties obtaining medications. 2. Venous stasis ulcerations. 3. Diastolic heart failure. 4. Hypokalemia. 5. Aspirin. 6. Hyperlipidemia. 7. Anemia. 8. Smoker.  MEDICATIONS:  Include Motrin 800 every 6 hours as needed, lisinopril 20 daily, atenolol 50/25 daily, clonidine 0.2 b.i.d.  PHYSICAL EXAMINATION:  VITAL SIGNS:  On examination, temperature 98.7 pulse 89, respirations 18, blood pressure initially measured as 230/128 and 210/120.  Patient has not been taking her medications, appeared anxious.  The wound on the lateral right leg is 3.9 x 3.8 x 0.1.  This was covered with a slough base and some accumulating crusting from drainage around the circumference of the wound.  There is surrounding edema and chronic erythema.  It would be difficult to differentiate what I am seeing here between cellulitis around the wound and simply chronic venous stasis.  Her peripheral circulation in terms of arterial supply seems adequate measured ABI in this clinic at 0.9.  IMPRESSION: 1. Chronic venous stasis with a new venous  insufficiency wound,     chronic edema and erythema around this.  The wound was debrided     using a curette.  She tolerates this marginally.  I gave her     prescription for doxycycline for a week, although I did not obtain     cultures as nothing really seemed to be that indicative of a     profitable culture in this wound bed.  We dressed this with Santyl     covered with an alginate, Vaseline gauze, and put her in an [employer].  She will keep this on for 1 week.  As mentioned, I did give     her a prescription for doxycycline, although I am not at all sure     this represents acute cellulitis. 2. Poorly controlled hypertension.  This seems to be a recurrent theme     for this woman looking through her     records.  She was anxious and nervous even before I got in the     room.  She is not on her medications although promised she would     get those today.  I will recheck this next week when she is here     and refer her appropriately as needed.  ______________________________ Maxwell Caul, M.D.     MGR/MEDQ  D:  12/05/2012  T:  12/06/2012  Job:  [id number]

## 2012-12-19 ENCOUNTER — Encounter (HOSPITAL_BASED_OUTPATIENT_CLINIC_OR_DEPARTMENT_OTHER): Payer: Self-pay | Attending: Internal Medicine

## 2012-12-19 DIAGNOSIS — I83009 Varicose veins of unspecified lower extremity with ulcer of unspecified site: Secondary | ICD-10-CM | POA: Insufficient documentation

## 2012-12-19 DIAGNOSIS — L97909 Non-pressure chronic ulcer of unspecified part of unspecified lower leg with unspecified severity: Secondary | ICD-10-CM | POA: Insufficient documentation

## 2012-12-19 DIAGNOSIS — I872 Venous insufficiency (chronic) (peripheral): Secondary | ICD-10-CM | POA: Insufficient documentation

## 2013-01-04 ENCOUNTER — Encounter (HOSPITAL_COMMUNITY): Payer: Self-pay | Admitting: Family Medicine

## 2013-01-04 ENCOUNTER — Emergency Department (HOSPITAL_COMMUNITY)
Admission: EM | Admit: 2013-01-04 | Discharge: 2013-01-04 | Disposition: A | Discharge status: Home / Self Care | Payer: Self-pay | Attending: Emergency Medicine | Admitting: Emergency Medicine

## 2013-01-04 ENCOUNTER — Emergency Department (HOSPITAL_COMMUNITY): Payer: Self-pay

## 2013-01-04 DIAGNOSIS — K2970 Gastritis, unspecified, without bleeding: Secondary | ICD-10-CM | POA: Insufficient documentation

## 2013-01-04 DIAGNOSIS — R1084 Generalized abdominal pain: Secondary | ICD-10-CM | POA: Insufficient documentation

## 2013-01-04 DIAGNOSIS — E876 Hypokalemia: Secondary | ICD-10-CM | POA: Insufficient documentation

## 2013-01-04 DIAGNOSIS — Z8679 Personal history of other diseases of the circulatory system: Secondary | ICD-10-CM | POA: Insufficient documentation

## 2013-01-04 DIAGNOSIS — Z79899 Other long term (current) drug therapy: Secondary | ICD-10-CM | POA: Insufficient documentation

## 2013-01-04 DIAGNOSIS — F17200 Nicotine dependence, unspecified, uncomplicated: Secondary | ICD-10-CM | POA: Insufficient documentation

## 2013-01-04 DIAGNOSIS — I10 Essential (primary) hypertension: Secondary | ICD-10-CM | POA: Insufficient documentation

## 2013-01-04 DIAGNOSIS — M549 Dorsalgia, unspecified: Secondary | ICD-10-CM | POA: Insufficient documentation

## 2013-01-04 DIAGNOSIS — I5030 Unspecified diastolic (congestive) heart failure: Secondary | ICD-10-CM | POA: Insufficient documentation

## 2013-01-04 DIAGNOSIS — R112 Nausea with vomiting, unspecified: Secondary | ICD-10-CM | POA: Insufficient documentation

## 2013-01-04 DIAGNOSIS — Z8639 Personal history of other endocrine, nutritional and metabolic disease: Secondary | ICD-10-CM | POA: Insufficient documentation

## 2013-01-04 DIAGNOSIS — R059 Cough, unspecified: Secondary | ICD-10-CM | POA: Insufficient documentation

## 2013-01-04 DIAGNOSIS — R0789 Other chest pain: Secondary | ICD-10-CM | POA: Insufficient documentation

## 2013-01-04 DIAGNOSIS — R05 Cough: Secondary | ICD-10-CM | POA: Insufficient documentation

## 2013-01-04 DIAGNOSIS — J45909 Unspecified asthma, uncomplicated: Secondary | ICD-10-CM | POA: Insufficient documentation

## 2013-01-04 DIAGNOSIS — R109 Unspecified abdominal pain: Secondary | ICD-10-CM

## 2013-01-04 DIAGNOSIS — Z862 Personal history of diseases of the blood and blood-forming organs and certain disorders involving the immune mechanism: Secondary | ICD-10-CM | POA: Insufficient documentation

## 2013-01-04 LAB — CBC WITH DIFFERENTIAL/PLATELET
Basophils Absolute: 0 10*3/uL (ref 0.0–0.1)
HCT: 40.4 % (ref 36.0–46.0)
Hemoglobin: 13.2 g/dL (ref 12.0–15.0)
Lymphocytes Relative: 19 % (ref 12–46)
Monocytes Absolute: 0.6 10*3/uL (ref 0.1–1.0)
Neutro Abs: 6.4 10*3/uL (ref 1.7–7.7)
RDW: 12.3 % (ref 11.5–15.5)
WBC: 8.6 10*3/uL (ref 4.0–10.5)

## 2013-01-04 LAB — TROPONIN I: Troponin I: <0.3 ng/mL (ref <0.30)

## 2013-01-04 LAB — COMPREHENSIVE METABOLIC PANEL
ALT: 22 U/L (ref 0–35)
AST: 21 U/L (ref 0–37)
Alkaline Phosphatase: 67 U/L (ref 39–117)
CO2: 29 mEq/L (ref 19–32)
Chloride: 95 mEq/L — ABNORMAL LOW (ref 96–112)
Creatinine, Ser: 1.37 mg/dL — ABNORMAL HIGH (ref 0.50–1.10)
GFR calc non Af Amer: 43 mL/min — ABNORMAL LOW (ref >90)
Total Bilirubin: 0.7 mg/dL (ref 0.3–1.2)

## 2013-01-04 LAB — URINALYSIS, ROUTINE W REFLEX MICROSCOPIC
Bilirubin Urine: NEGATIVE
Glucose, UA: 100 mg/dL — AB
Hgb urine dipstick: NEGATIVE
Ketones, ur: NEGATIVE mg/dL
pH: 6 (ref 5.0–8.0)

## 2013-01-04 LAB — URINE MICROSCOPIC-ADD ON

## 2013-01-04 MED ORDER — GI COCKTAIL ~~LOC~~
30.0000 mL | Freq: Once | ORAL | Status: AC
  Administered 2013-01-04: 30 mL via ORAL
  Filled 2013-01-04: qty 30

## 2013-01-04 MED ORDER — CLONIDINE HCL 0.2 MG PO TABS
0.2000 mg | ORAL_TABLET | Freq: Once | ORAL | Status: AC
  Administered 2013-01-04: 0.2 mg via ORAL
  Filled 2013-01-04: qty 1

## 2013-01-04 MED ORDER — POTASSIUM CHLORIDE CRYS ER 20 MEQ PO TBCR
40.0000 meq | EXTENDED_RELEASE_TABLET | Freq: Once | ORAL | Status: AC
  Administered 2013-01-04: 40 meq via ORAL
  Filled 2013-01-04: qty 2

## 2013-01-04 MED ORDER — HYDROCODONE-ACETAMINOPHEN 5-325 MG PO TABS: 2.0000 | ORAL_TABLET | ORAL | Status: DC | PRN

## 2013-01-04 MED ORDER — NITROGLYCERIN 0.4 MG SL SUBL
0.4000 mg | SUBLINGUAL_TABLET | SUBLINGUAL | Status: AC | PRN
  Administered 2013-01-04 (×3): 0.4 mg via SUBLINGUAL
  Filled 2013-01-04: qty 25

## 2013-01-04 MED ORDER — ONDANSETRON HCL 4 MG/2ML IJ SOLN
4.0000 mg | Freq: Once | INTRAMUSCULAR | Status: AC
  Administered 2013-01-04: 4 mg via INTRAVENOUS
  Filled 2013-01-04: qty 2

## 2013-01-04 MED ORDER — POTASSIUM CHLORIDE CRYS ER 20 MEQ PO TBCR: 40.0000 meq | EXTENDED_RELEASE_TABLET | Freq: Every day | ORAL | Status: DC

## 2013-01-04 MED ORDER — NITROGLYCERIN 2 % TD OINT
1.0000 [in_us] | TOPICAL_OINTMENT | Freq: Once | TRANSDERMAL | Status: AC
  Administered 2013-01-04: 1 [in_us] via TOPICAL
  Filled 2013-01-04: qty 1

## 2013-01-04 MED ORDER — SODIUM CHLORIDE 0.9 % IV SOLN
INTRAVENOUS | Status: DC
  Administered 2013-01-04: 08:00:00 via INTRAVENOUS

## 2013-01-04 MED ORDER — PROMETHAZINE HCL 25 MG PO TABS: 25.0000 mg | ORAL_TABLET | Freq: Four times a day (QID) | ORAL | Status: DC | PRN

## 2013-01-04 MED ORDER — RANITIDINE HCL 150 MG PO TABS: 150.0000 mg | ORAL_TABLET | Freq: Two times a day (BID) | ORAL | Status: DC

## 2013-01-04 NOTE — ED Notes (Signed)
Per EMS, pt having chest pain that started 5 days ago and vomiting green stuff. sts hypertension and unable to get BP under control. BP 190/112. 1 nitro given and chest pain decreased and BP decreased. 324 ASA given also. IV established. HR 96. 98% Ra. cbg 126

## 2013-01-04 NOTE — ED Notes (Signed)
Pt transported to ultrasound.

## 2013-01-04 NOTE — ED Notes (Signed)
Nitroglycerin paste removed due to hypotension. EDP aware.

## 2013-01-04 NOTE — ED Notes (Signed)
Pt resting quietly at the time. 7/10 chest pain. Remains on cardiac monitor. Vital signs stable. Family at bedside.

## 2013-01-04 NOTE — ED Provider Notes (Signed)
History     CSN: [id number]  Arrival date & time 01/04/13  1324   First MD Initiated Contact with Patient 01/04/13 [phone]      Chief Complaint  Patient presents with  . Chest Pain    (Consider location/radiation/quality/duration/timing/severity/associated sxs/prior treatment) HPI Comments: Patient presents to the ER for evaluation of chest and abdominal pain. Patient reports that symptoms began several days ago. She has been experiencing nausea and vomiting without any diarrhea. Vomit has been green, no blood in her vomit. No rectal bleeding. There has been no fever. Patient reports a fullness in the center of her chest and moderate pain in the upper abdomen that radiates into her back. She feels very weak. No shortness of breath.  Patient is a 52 y.o. female presenting with chest pain.  Chest Pain Associated symptoms: abdominal pain, back pain and cough   Associated symptoms: no fever     Past Medical History  Diagnosis Date  . Hypertension   . Venous stasis ulcers   . Diastolic heart failure   . Hypokalemia   . Asthma   . Dyslipidemia   . Anemia     History reviewed. No pertinent past surgical history.  History reviewed. No pertinent family history.  History  Substance Use Topics  . Smoking status: Current Every Day Smoker -- 0.50 packs/day    Types: Cigarettes  . Smokeless tobacco: Not on file  . Alcohol Use: 1.2 oz/week    2 Cans of beer per week    OB History   Grav Para Term Preterm Abortions TAB SAB Ect Mult Living                  Review of Systems  Constitutional: Negative for fever.  Respiratory: Positive for cough.   Cardiovascular: Positive for chest pain.  Gastrointestinal: Positive for abdominal pain.  Musculoskeletal: Positive for back pain.  All other systems reviewed and are negative.    Allergies  Review of patient's allergies indicates no known allergies.  Home Medications   Current Outpatient Rx  Name  Route  Sig  Dispense  Refill   . acetaminophen (TYLENOL) 325 MG tablet   Oral   Take 650 mg by mouth every 6 (six) hours as needed. pain         . atenolol-chlorthalidone (TENORETIC) 50-25 MG per tablet   Oral   Take 1 tablet by mouth daily.   30 tablet   0   . bacitracin 500 UNIT/GM ointment   Topical   Apply 1 application topically 2 (two) times daily. Apply to open sores on lower legs         . cloNIDine (CATAPRES) 0.2 MG tablet   Oral   Take 1 tablet (0.2 mg total) by mouth 2 (two) times daily.   60 tablet   0   . ibuprofen (ADVIL,MOTRIN) 200 MG tablet   Oral   Take 800 mg by mouth every 6 (six) hours as needed. pain         . lisinopril (PRINIVIL,ZESTRIL) 20 MG tablet   Oral   Take 1 tablet (20 mg total) by mouth daily.   30 tablet   0   . potassium chloride SA (K-DUR,KLOR-CON) 20 MEQ tablet   Oral   Take 1 tablet (20 mEq total) by mouth daily.   30 tablet   0   . silver sulfADIAZINE (SILVADENE) 1 % cream   Topical   Apply 1 application topically daily. Apply to sores/inflammation on lower  legs           There were no vitals taken for this visit.  Physical Exam  Constitutional: She is oriented to person, place, and time. She appears well-developed and well-nourished. No distress.  HENT:  Head: Normocephalic and atraumatic.  Right Ear: Hearing normal.  Left Ear: Hearing normal.  Nose: Nose normal.  Mouth/Throat: Oropharynx is clear and moist and mucous membranes are normal.  Eyes: Conjunctivae and EOM are normal. Pupils are equal, round, and reactive to light.  Neck: Normal range of motion. Neck supple.  Cardiovascular: Regular rhythm, S1 normal and S2 normal.  Exam reveals no gallop and no friction rub.   No murmur heard. Pulmonary/Chest: Effort normal and breath sounds normal. No respiratory distress. She exhibits no tenderness.  Abdominal: Soft. Normal appearance and bowel sounds are normal. There is no hepatosplenomegaly. There is generalized tenderness. There is no  rebound, no guarding, no tenderness at McBurney's point and negative Murphy's sign. No hernia.  Musculoskeletal: Normal range of motion.  Neurological: She is alert and oriented to person, place, and time. She has normal strength. No cranial nerve deficit or sensory deficit. Coordination normal. GCS eye subscore is 4. GCS verbal subscore is 5. GCS motor subscore is 6.  Skin: Skin is warm, dry and intact. No rash noted. No cyanosis.  Psychiatric: She has a normal mood and affect. Her speech is normal and behavior is normal. Thought content normal.    ED Course  Procedures (including critical care time)  EKG:  Date: 01/04/2013  Rate: 94  Rhythm: normal sinus rhythm  QRS Axis: left  Intervals: normal  ST/T Wave abnormalities: normal  Conduction Disutrbances:none  Narrative Interpretation:   Old EKG Reviewed: none available    Labs Reviewed  COMPREHENSIVE METABOLIC PANEL - Abnormal; Notable for the following:    Potassium 2.7 (*)    Chloride 95 (*)    Glucose, Bld 125 (*)    Creatinine, Ser 1.37 (*)    Total Protein 9.0 (*)    GFR calc non Af Amer 43 (*)    GFR calc Af Amer 50 (*)    All other components within normal limits  PRO B NATRIURETIC PEPTIDE - Abnormal; Notable for the following:    Pro B Natriuretic peptide (BNP) 521.6 (*)    All other components within normal limits  LIPASE, BLOOD - Abnormal; Notable for the following:    Lipase 89 (*)    All other components within normal limits  CBC WITH DIFFERENTIAL  TROPONIN I  URINALYSIS, ROUTINE W REFLEX MICROSCOPIC   US Abdomen Complete  01/04/2013   *RADIOLOGY REPORT*  Clinical Data:  Abdominal/chest pain and vomiting.  COMPLETE ABDOMINAL ULTRASOUND  Comparison:  Acute abdomen series same date.  08/06/2008 CT.  Findings:        Gallbladder:  normal, without stone, wall thickening, or       pericholecystic fluid.  Sonographic Murphy's sign was not       elicited.              Common bile duct:  normal, at 4 mm        Liver:   normal in echogenicity without focal lesion.        IVC:  within normal limits.        Pancreas:  normal.        Spleen:  normal in size and echotexture.        Right Kidney:  10.9 cm.  No hydronephrosis.  Left Kidney:  10.3 cm.  No hydronephrosis.  Abdominal aorta:  Non aneurysmal without ascites.  IMPRESSION: No acute process or explanation for abdominal pain and vomiting.   Original Report Authenticated By: Jeronimo Greaves, M.D.   Dg Abd Acute W/chest  01/04/2013   *RADIOLOGY REPORT*  Clinical Data: Abdominal and chest pain.  ACUTE ABDOMEN SERIES (ABDOMEN 2 VIEW & CHEST 1 VIEW)  Comparison: 11/11/2011 and 02/07/2007 radiographs  Findings: The cardiomediastinal silhouette is unremarkable. The lungs are clear. There is no evidence of airspace disease, pleural effusion or pneumothorax.  The bowel gas pattern is unremarkable. There is no evidence of bowel obstruction or pneumoperitoneum. A 5 mm calcification in the left anatomic pelvis is indeterminate. No acute bony abnormalities are noted.  IMPRESSION: Unremarkable bowel gas pattern - no evidence of bowel obstruction or pneumoperitoneum.  Indeterminate 5 mm left pelvic calcification.  Consider further evaluation if there is clinical suspicion for a distal left ureteral calculus.   Original Report Authenticated By: Harmon Pier, M.D.     Diagnosis: 1. abdominal pain 2. Vomiting 3. atypical chest pain 4. hypokalemia    MDM  Patient presents to the ER with complaints of nausea, vomiting, abdominal pain and chest pain. Symptoms have been present for up to 5 days. Patient reports that her discomfort has worsened overnight. She was sent home from work today because of the vomiting. Patient's abdominal exam was significant for mild epigastric tenderness without guarding, rebound or signs of peritonitis. Negative Murphy sign. She did have active vomiting at arrival which resolved with Zofran and IV fluids. X-ray does show any evidence of obstruction. Ultrasound  showed no gallbladder disease or other abnormalities.  Patient was complaining of a burning pain in the Center for chest arrival and was very hypertensive. She had not been taking her medications, including her clonidine, because of the vomiting. She was given nitroglycerin and clonidine the blood pressure improved. Pain in her chest resolved after a GI cocktail. Symptoms are most consistent with gastritis resulting in chest pain of GI origin. Patient has been hydrated here in the ER, will be discharged to home. Continue symptomatic treatment.        Gilda Crease, MD 01/04/13 [phone]

## 2013-01-04 NOTE — ED Notes (Signed)
Pt resting quietly at the time. Vital signs stable. Remains on cardiac monitor. Denies chest pain. Pt states she feels much better. She remains alert and oriented x4.

## 2013-01-04 NOTE — ED Notes (Signed)
Pt states 7/10 pain at the time. Resting quietly at the time. Remains on cardiac monitor.

## 2013-01-05 LAB — URINE CULTURE

## 2013-01-06 ENCOUNTER — Encounter (HOSPITAL_COMMUNITY): Payer: Self-pay | Admitting: Emergency Medicine

## 2013-01-06 ENCOUNTER — Emergency Department (HOSPITAL_COMMUNITY)
Admission: EM | Admit: 2013-01-06 | Discharge: 2013-01-06 | Disposition: A | Discharge status: Home / Self Care | Payer: Self-pay | Attending: Emergency Medicine | Admitting: Emergency Medicine

## 2013-01-06 DIAGNOSIS — I83009 Varicose veins of unspecified lower extremity with ulcer of unspecified site: Secondary | ICD-10-CM | POA: Insufficient documentation

## 2013-01-06 DIAGNOSIS — J45909 Unspecified asthma, uncomplicated: Secondary | ICD-10-CM | POA: Insufficient documentation

## 2013-01-06 DIAGNOSIS — Z79899 Other long term (current) drug therapy: Secondary | ICD-10-CM | POA: Insufficient documentation

## 2013-01-06 DIAGNOSIS — F17200 Nicotine dependence, unspecified, uncomplicated: Secondary | ICD-10-CM | POA: Insufficient documentation

## 2013-01-06 DIAGNOSIS — R079 Chest pain, unspecified: Secondary | ICD-10-CM | POA: Insufficient documentation

## 2013-01-06 DIAGNOSIS — R1110 Vomiting, unspecified: Secondary | ICD-10-CM

## 2013-01-06 DIAGNOSIS — I10 Essential (primary) hypertension: Secondary | ICD-10-CM | POA: Insufficient documentation

## 2013-01-06 DIAGNOSIS — R1013 Epigastric pain: Secondary | ICD-10-CM | POA: Insufficient documentation

## 2013-01-06 DIAGNOSIS — R112 Nausea with vomiting, unspecified: Secondary | ICD-10-CM | POA: Insufficient documentation

## 2013-01-06 DIAGNOSIS — I5030 Unspecified diastolic (congestive) heart failure: Secondary | ICD-10-CM | POA: Insufficient documentation

## 2013-01-06 DIAGNOSIS — Z862 Personal history of diseases of the blood and blood-forming organs and certain disorders involving the immune mechanism: Secondary | ICD-10-CM | POA: Insufficient documentation

## 2013-01-06 DIAGNOSIS — E876 Hypokalemia: Secondary | ICD-10-CM | POA: Insufficient documentation

## 2013-01-06 DIAGNOSIS — Z8639 Personal history of other endocrine, nutritional and metabolic disease: Secondary | ICD-10-CM | POA: Insufficient documentation

## 2013-01-06 LAB — CBC WITH DIFFERENTIAL/PLATELET
Basophils Absolute: 0 10*3/uL (ref 0.0–0.1)
Basophils Relative: 0 % (ref 0–1)
Eosinophils Absolute: 0 10*3/uL (ref 0.0–0.7)
Eosinophils Relative: 0 % (ref 0–5)
HCT: 41 % (ref 36.0–46.0)
Hemoglobin: 13.6 g/dL (ref 12.0–15.0)
Lymphocytes Relative: 19 % (ref 12–46)
Lymphs Abs: 1.3 10*3/uL (ref 0.7–4.0)
MCH: 28.3 pg (ref 26.0–34.0)
MCHC: 33.2 g/dL (ref 30.0–36.0)
MCV: 85.4 fL (ref 78.0–100.0)
Monocytes Absolute: 0.3 10*3/uL (ref 0.1–1.0)
Monocytes Relative: 4 % (ref 3–12)
Neutro Abs: 5.2 10*3/uL (ref 1.7–7.7)
Neutrophils Relative %: 77 % (ref 43–77)
Platelets: 258 10*3/uL (ref 150–400)
RBC: 4.8 MIL/uL (ref 3.87–5.11)
RDW: 12.3 % (ref 11.5–15.5)
WBC: 6.8 10*3/uL (ref 4.0–10.5)

## 2013-01-06 LAB — COMPREHENSIVE METABOLIC PANEL
ALT: 17 U/L (ref 0–35)
AST: 18 U/L (ref 0–37)
Albumin: 4.1 g/dL (ref 3.5–5.2)
Alkaline Phosphatase: 68 U/L (ref 39–117)
BUN: 27 mg/dL — ABNORMAL HIGH (ref 6–23)
CO2: 25 mEq/L (ref 19–32)
Calcium: 9.6 mg/dL (ref 8.4–10.5)
Chloride: 97 mEq/L (ref 96–112)
Creatinine, Ser: 0.9 mg/dL (ref 0.50–1.10)
GFR calc Af Amer: 84 mL/min — ABNORMAL LOW (ref >90)
GFR calc non Af Amer: 72 mL/min — ABNORMAL LOW (ref >90)
Glucose, Bld: 126 mg/dL — ABNORMAL HIGH (ref 70–99)
Potassium: 3.1 mEq/L — ABNORMAL LOW (ref 3.5–5.1)
Sodium: 138 mEq/L (ref 135–145)
Total Bilirubin: 0.5 mg/dL (ref 0.3–1.2)
Total Protein: 8.6 g/dL — ABNORMAL HIGH (ref 6.0–8.3)

## 2013-01-06 LAB — PRO B NATRIURETIC PEPTIDE: Pro B Natriuretic peptide (BNP): 280.7 pg/mL — ABNORMAL HIGH (ref 0–125)

## 2013-01-06 LAB — POCT I-STAT TROPONIN I: Troponin i, poc: 0.01 ng/mL (ref 0.00–0.08)

## 2013-01-06 MED ORDER — ONDANSETRON HCL 4 MG/2ML IJ SOLN
4.0000 mg | Freq: Once | INTRAMUSCULAR | Status: AC
  Administered 2013-01-06: 4 mg via INTRAVENOUS
  Filled 2013-01-06: qty 2

## 2013-01-06 MED ORDER — GI COCKTAIL ~~LOC~~
30.0000 mL | Freq: Once | ORAL | Status: AC
  Administered 2013-01-06: 30 mL via ORAL
  Filled 2013-01-06: qty 30

## 2013-01-06 MED ORDER — ACETAMINOPHEN 325 MG PO TABS
650.0000 mg | ORAL_TABLET | Freq: Once | ORAL | Status: AC
  Administered 2013-01-06: 650 mg via ORAL

## 2013-01-06 MED ORDER — CLONIDINE HCL 0.2 MG PO TABS
0.2000 mg | ORAL_TABLET | Freq: Once | ORAL | Status: AC
  Administered 2013-01-06: 0.2 mg via ORAL
  Filled 2013-01-06: qty 1

## 2013-01-06 MED ORDER — SODIUM CHLORIDE 0.9 % IV BOLUS (SEPSIS)
1000.0000 mL | Freq: Once | INTRAVENOUS | Status: AC
  Administered 2013-01-06: 1000 mL via INTRAVENOUS

## 2013-01-06 MED ORDER — NITROGLYCERIN 0.4 MG SL SUBL
0.4000 mg | SUBLINGUAL_TABLET | SUBLINGUAL | Status: DC | PRN
  Administered 2013-01-06 (×2): 0.4 mg via SUBLINGUAL
  Filled 2013-01-06: qty 25

## 2013-01-06 MED ORDER — ONDANSETRON HCL 4 MG PO TABS: 4.0000 mg | ORAL_TABLET | Freq: Four times a day (QID) | ORAL | Status: DC

## 2013-01-06 MED ORDER — POTASSIUM CHLORIDE CRYS ER 20 MEQ PO TBCR
40.0000 meq | EXTENDED_RELEASE_TABLET | Freq: Once | ORAL | Status: AC
  Administered 2013-01-06: 40 meq via ORAL
  Filled 2013-01-06: qty 2

## 2013-01-06 MED ORDER — LABETALOL HCL 5 MG/ML IV SOLN
20.0000 mg | Freq: Once | INTRAVENOUS | Status: AC
  Administered 2013-01-06: 20 mg via INTRAVENOUS
  Filled 2013-01-06: qty 4

## 2013-01-06 NOTE — ED Notes (Signed)
Pt vomited after administration of medications.

## 2013-01-06 NOTE — ED Notes (Signed)
[place] PA at bedside 

## 2013-01-06 NOTE — ED Provider Notes (Signed)
History     CSN: [id number]  Arrival date & time 01/06/13  1132   First MD Initiated Contact with Patient 01/06/13 1145      Chief Complaint  Patient presents with  . Emesis  . Chest Pain    (Consider location/radiation/quality/duration/timing/severity/associated sxs/prior treatment) HPI  52 year old female with history of hypertension, diastolic heart failure, hypokalemia, and anemia presents complaining of chest pain. Patient reports having abdominal and chest pain with associate nausea and vomiting ongoing for a week. Describe chest pain as a dull sensation to her mid chest, lasting for seconds to minutes, intermittent, mild to moderate, does not associate with exertion. She also endorsed mild epigastric tenderness and described as a sharp sensation, nonradiating, intermittent. She endorses nausea, persistent cough, and unable to keep medication down due to the nausea and vomiting. Has had upward to 5-6 bouts of nonbloody emesis each day for the past 4 days. Her last bowel movement was yesterday and was normal. Endorse throbbing headache to forehead for the past 2-3 hrs.  No vision changes.  No slurred speech, numbness or weakness complaint.  Patient also reports having a pressure ulcer on her right leg has been cared for at the wound care clinic. No history of diabetes. Patient was seen in the ED 2 days prior for the same complaint. The symptoms improved after receiving medication in the ED however it worsen once she returns home.  No prior history of MI. Abdominal ultrasound from last visit does not show evidence of biliary disease.    Past Medical History  Diagnosis Date  . Hypertension   . Venous stasis ulcers   . Diastolic heart failure   . Hypokalemia   . Asthma   . Dyslipidemia   . Anemia     History reviewed. No pertinent past surgical history.  History reviewed. No pertinent family history.  History  Substance Use Topics  . Smoking status: Current Every Day Smoker  -- 0.50 packs/day    Types: Cigarettes  . Smokeless tobacco: Not on file  . Alcohol Use: 1.2 oz/week    2 Cans of beer per week    OB History   Grav Para Term Preterm Abortions TAB SAB Ect Mult Living                  Review of Systems  Constitutional:       10 Systems reviewed and all are negative for acute change except as noted in the HPI.     Allergies  Review of patient's allergies indicates no known allergies.  Home Medications   Current Outpatient Rx  Name  Route  Sig  Dispense  Refill  . cloNIDine (CATAPRES) 0.3 MG tablet   Oral   Take 0.3 mg by mouth 2 (two) times daily.         Marland Kitchen HYDROcodone-acetaminophen (NORCO/VICODIN) 5-325 MG per tablet   Oral   Take 2 tablets by mouth every 4 (four) hours as needed for pain.   10 tablet   0   . potassium chloride SA (K-DUR,KLOR-CON) 20 MEQ tablet   Oral   Take 20 mEq by mouth daily.         . potassium chloride SA (K-DUR,KLOR-CON) 20 MEQ tablet   Oral   Take 2 tablets (40 mEq total) by mouth daily.   8 tablet   0   . promethazine (PHENERGAN) 25 MG tablet   Oral   Take 1 tablet (25 mg total) by mouth every 6 (six) hours  as needed for nausea.   30 tablet   0   . ranitidine (ZANTAC) 150 MG tablet   Oral   Take 1 tablet (150 mg total) by mouth 2 (two) times daily.   60 tablet   0     BP 214/117  Pulse 83  Temp(Src) 99 F (37.2 C) (Oral)  SpO2 100%  Physical Exam  Nursing note and vitals reviewed. Constitutional: She appears well-developed and well-nourished. No distress.  Awake, alert, nontoxic appearance  HENT:  Head: Atraumatic.  Mouth/Throat: Oropharynx is clear and moist.  Eyes: Conjunctivae are normal. Right eye exhibits no discharge. Left eye exhibits no discharge.  Neck: Neck supple.  Cardiovascular: Normal rate, regular rhythm and intact distal pulses.   Pulmonary/Chest: Effort normal. No respiratory distress. She exhibits no tenderness.  Abdominal: Soft. There is no tenderness (Mild  epigastric tenderness without guarding or rebound tenderness.). There is no rebound.  Musculoskeletal: She exhibits no edema (No significant edema noted however difficult exam due to large body habitus) and no tenderness.  ROM appears intact, no obvious focal weakness  R lower leg is wrapped with dressing and kerlex (pt has pressure ulcer, being cared by wound care specialist)  Neurological: She is alert.  Mental status and motor strength appears intact  Skin: No rash noted.  Psychiatric: She has a normal mood and affect.    ED Course  Procedures (including critical care time)   Date: 01/06/2013  Rate: 88  Rhythm: normal sinus rhythm  QRS Axis: left  Intervals: PR prolonged  ST/T Wave abnormalities: nonspecific ST/T changes  Conduction Disutrbances:first-degree A-V block   Narrative Interpretation:   Old EKG Reviewed: unchanged    12:49 PM Patient presents with atypical chest pain. Currently chest pain-free. Endorse nausea and vomiting. Blood pressure is high today with a systolic of 240s.  Unable to keep her home medication down for the past few days.  Was seen 2 days ago for same complaint.  Has full work up at that time and her sxs was thought to be due to gastritis.  abd [place] neg during last visit 2 days ago.  Has normal BM yesterday. Plan to give nitroglycerin, clonidine, IV fluid. Workup initiated. Care discussed with attending.  2:20 PM Patient reports symptom is improving. Nausea has improved but does not want to try by mouth at this time. Her blood pressure has fluctuated to be as low as 155 systolic to currently at 200 systolic.  4:06 PM BP remains at 200s systolic.  Pt able to tolerates PO.  K+ is 3.1, potassium supplementation given, pt tolerates.  My attending has evaluate pt and felt pt stable for discharge.  Pt will need to f/u with her PCP.  Return precaution discussed.    Labs Reviewed  COMPREHENSIVE METABOLIC PANEL - Abnormal; Notable for the following:     Potassium 3.1 (*)    Glucose, Bld 126 (*)    BUN 27 (*)    Total Protein 8.6 (*)    GFR calc non Af Amer 72 (*)    GFR calc Af Amer 84 (*)    All other components within normal limits  PRO B NATRIURETIC PEPTIDE - Abnormal; Notable for the following:    Pro B Natriuretic peptide (BNP) 280.7 (*)    All other components within normal limits  CBC WITH DIFFERENTIAL  POCT I-STAT TROPONIN I   No results found.   1. Emesis   2. Hypertension       MDM  BP  208/109  Pulse 74  Temp(Src) 98.8 F (37.1 C) (Oral)  Resp 17  SpO2 98%  I have reviewed nursing notes and vital signs. I personally reviewed the imaging tests through PACS system  I reviewed available ER/hospitalization records thought the EMR         Fayrene Helper, [place] 01/06/13 1622

## 2013-01-06 NOTE — ED Notes (Signed)
Per EMS pt has had n/v x 12hrs. Pt was here 1 wk ago for low K and given potassium pills. Pt has a hx of HTN, pt is non compliant for meds due to nausea and vomiting. Pt began to c/o pressure in chest upon arrival to ED. She was sinus rhythm on monitor. Vitals 208/127, 80 HR, Resp 16 pt was given 4mg  zofran iv. Pt has 20g in LAC. Pt also has a pressure ulcer in rt leg.

## 2013-01-06 NOTE — ED Notes (Signed)
Pt states she has been unable to keep anything down due to nausea and vomiting. Pt also c/o central chest pain that started last night. Pt describes it as a pressure or fullness. Pt states she was here last Saturday due to nausea and vomiting and since being discharged she still is unable to keep anything down or take her medications. Pt states " I feel terrible". Pt also states her emesis is green in color.

## 2013-01-06 NOTE — ED Notes (Signed)
Discharge and follow up instructions reviewed. Pt verbalized understanding.  

## 2013-01-07 NOTE — ED Provider Notes (Signed)
Medical screening examination/treatment/procedure(s) were conducted as a shared visit with non-physician practitioner(s) and myself.  I personally evaluated the patient during the encounter.  52yF with atypical CP. Doubt ACS. Persistent n/v and also epigastric pain. No tenderness on my exam. W/u fairly unremarkable. Imaging from last eval reviewed. May be gastritis or PUD. Pt already on zantac. Encouraged to quit smoking as likely exacerbating. Tolerated potassium supplementation prior to DC.  Raeford Razor, MD 01/07/13 [phone]

## 2013-01-23 ENCOUNTER — Encounter (HOSPITAL_BASED_OUTPATIENT_CLINIC_OR_DEPARTMENT_OTHER): Payer: Self-pay | Attending: Internal Medicine

## 2013-01-23 DIAGNOSIS — I83009 Varicose veins of unspecified lower extremity with ulcer of unspecified site: Secondary | ICD-10-CM | POA: Insufficient documentation

## 2013-02-20 ENCOUNTER — Encounter (HOSPITAL_BASED_OUTPATIENT_CLINIC_OR_DEPARTMENT_OTHER): Payer: Self-pay | Attending: General Surgery

## 2013-02-20 DIAGNOSIS — I872 Venous insufficiency (chronic) (peripheral): Secondary | ICD-10-CM | POA: Insufficient documentation

## 2013-02-20 DIAGNOSIS — L97909 Non-pressure chronic ulcer of unspecified part of unspecified lower leg with unspecified severity: Secondary | ICD-10-CM | POA: Insufficient documentation

## 2013-02-20 DIAGNOSIS — I83009 Varicose veins of unspecified lower extremity with ulcer of unspecified site: Secondary | ICD-10-CM | POA: Insufficient documentation

## 2013-05-12 ENCOUNTER — Encounter (HOSPITAL_COMMUNITY): Payer: Self-pay | Admitting: Emergency Medicine

## 2013-05-12 ENCOUNTER — Other Ambulatory Visit: Payer: Self-pay

## 2013-05-12 ENCOUNTER — Observation Stay (HOSPITAL_COMMUNITY)
Admission: EM | Admit: 2013-05-12 | Discharge: 2013-05-13 | Disposition: A | Discharge status: Home / Self Care | Payer: Self-pay | Attending: Internal Medicine | Admitting: Internal Medicine

## 2013-05-12 ENCOUNTER — Emergency Department (HOSPITAL_COMMUNITY): Payer: Self-pay

## 2013-05-12 DIAGNOSIS — I10 Essential (primary) hypertension: Secondary | ICD-10-CM | POA: Diagnosis present

## 2013-05-12 DIAGNOSIS — I872 Venous insufficiency (chronic) (peripheral): Secondary | ICD-10-CM | POA: Insufficient documentation

## 2013-05-12 DIAGNOSIS — L97909 Non-pressure chronic ulcer of unspecified part of unspecified lower leg with unspecified severity: Secondary | ICD-10-CM | POA: Insufficient documentation

## 2013-05-12 DIAGNOSIS — E876 Hypokalemia: Secondary | ICD-10-CM | POA: Insufficient documentation

## 2013-05-12 DIAGNOSIS — I5032 Chronic diastolic (congestive) heart failure: Secondary | ICD-10-CM | POA: Insufficient documentation

## 2013-05-12 DIAGNOSIS — Z79899 Other long term (current) drug therapy: Secondary | ICD-10-CM | POA: Insufficient documentation

## 2013-05-12 DIAGNOSIS — I509 Heart failure, unspecified: Secondary | ICD-10-CM | POA: Insufficient documentation

## 2013-05-12 DIAGNOSIS — I208 Other forms of angina pectoris: Secondary | ICD-10-CM

## 2013-05-12 DIAGNOSIS — I160 Hypertensive urgency: Secondary | ICD-10-CM

## 2013-05-12 DIAGNOSIS — IMO0001 Reserved for inherently not codable concepts without codable children: Secondary | ICD-10-CM

## 2013-05-12 DIAGNOSIS — E785 Hyperlipidemia, unspecified: Secondary | ICD-10-CM | POA: Diagnosis present

## 2013-05-12 DIAGNOSIS — I83009 Varicose veins of unspecified lower extremity with ulcer of unspecified site: Secondary | ICD-10-CM

## 2013-05-12 DIAGNOSIS — R072 Precordial pain: Principal | ICD-10-CM | POA: Diagnosis present

## 2013-05-12 HISTORY — DX: Heart failure, unspecified: I50.9

## 2013-05-12 HISTORY — DX: Unspecified chronic bronchitis: J42

## 2013-05-12 HISTORY — DX: Essential (primary) hypertension: I10

## 2013-05-12 HISTORY — DX: Shortness of breath: R06.02

## 2013-05-12 HISTORY — DX: Gastro-esophageal reflux disease without esophagitis: K21.9

## 2013-05-12 LAB — CBC WITH DIFFERENTIAL/PLATELET
Basophils Relative: 0 % (ref 0–1)
Eosinophils Absolute: 0 10*3/uL (ref 0.0–0.7)
Eosinophils Relative: 1 % (ref 0–5)
Lymphs Abs: 1.8 10*3/uL (ref 0.7–4.0)
MCH: 28 pg (ref 26.0–34.0)
MCHC: 32.4 g/dL (ref 30.0–36.0)
MCV: 86.4 fL (ref 78.0–100.0)
Monocytes Relative: 6 % (ref 3–12)
Platelets: 385 10*3/uL (ref 150–400)
RBC: 4.35 MIL/uL (ref 3.87–5.11)

## 2013-05-12 LAB — URINALYSIS, ROUTINE W REFLEX MICROSCOPIC
Hgb urine dipstick: NEGATIVE
Ketones, ur: NEGATIVE mg/dL
Leukocytes, UA: NEGATIVE
Protein, ur: NEGATIVE mg/dL
Urobilinogen, UA: 0.2 mg/dL (ref 0.0–1.0)

## 2013-05-12 LAB — TROPONIN I: Troponin I: <0.3 ng/mL (ref <0.30)

## 2013-05-12 LAB — BASIC METABOLIC PANEL
BUN: 13 mg/dL (ref 6–23)
Calcium: 9.6 mg/dL (ref 8.4–10.5)
GFR calc non Af Amer: 63 mL/min — ABNORMAL LOW (ref >90)
Glucose, Bld: 95 mg/dL (ref 70–99)
Sodium: 137 mEq/L (ref 135–145)

## 2013-05-12 LAB — POCT I-STAT TROPONIN I: Troponin i, poc: 0.01 ng/mL (ref 0.00–0.08)

## 2013-05-12 MED ORDER — CLONIDINE HCL 0.1 MG PO TABS: 0.1000 mg | ORAL_TABLET | ORAL | Status: DC

## 2013-05-12 MED ORDER — MORPHINE SULFATE 2 MG/ML IJ SOLN: 1.0000 mg | INTRAMUSCULAR | Status: DC | PRN

## 2013-05-12 MED ORDER — CLONIDINE HCL 0.1 MG PO TABS
0.1000 mg | ORAL_TABLET | ORAL | Status: DC | PRN
  Filled 2013-05-12: qty 1

## 2013-05-12 MED ORDER — MORPHINE SULFATE 2 MG/ML IJ SOLN
1.0000 mg | INTRAMUSCULAR | Status: DC | PRN
  Administered 2013-05-12: 1 mg via INTRAVENOUS
  Filled 2013-05-12: qty 1

## 2013-05-12 MED ORDER — NITROGLYCERIN 0.4 MG SL SUBL: 0.4000 mg | SUBLINGUAL_TABLET | SUBLINGUAL | Status: DC | PRN

## 2013-05-12 MED ORDER — POTASSIUM CHLORIDE CRYS ER 20 MEQ PO TBCR
40.0000 meq | EXTENDED_RELEASE_TABLET | Freq: Once | ORAL | Status: AC
  Administered 2013-05-12: 19:00:00 40 meq via ORAL
  Filled 2013-05-12: qty 2

## 2013-05-12 MED ORDER — ASPIRIN 81 MG PO CHEW
324.0000 mg | CHEWABLE_TABLET | Freq: Once | ORAL | Status: AC
  Administered 2013-05-12: 324 mg via ORAL
  Filled 2013-05-12: qty 4

## 2013-05-12 MED ORDER — ASPIRIN 81 MG PO CHEW
CHEWABLE_TABLET | ORAL | Status: AC
  Filled 2013-05-12: qty 1

## 2013-05-12 MED ORDER — HEPARIN SODIUM (PORCINE) 5000 UNIT/ML IJ SOLN
5000.0000 [IU] | Freq: Three times a day (TID) | INTRAMUSCULAR | Status: DC
  Administered 2013-05-12 – 2013-05-13 (×3): 5000 [IU] via SUBCUTANEOUS
  Filled 2013-05-12 (×6): qty 1

## 2013-05-12 MED ORDER — SODIUM CHLORIDE 0.9 % IJ SOLN
3.0000 mL | Freq: Two times a day (BID) | INTRAMUSCULAR | Status: DC
  Administered 2013-05-12 – 2013-05-13 (×2): 3 mL via INTRAVENOUS

## 2013-05-12 MED ORDER — CLONIDINE HCL 0.3 MG PO TABS
0.3000 mg | ORAL_TABLET | Freq: Two times a day (BID) | ORAL | Status: DC
  Administered 2013-05-12 – 2013-05-13 (×2): 0.3 mg via ORAL
  Filled 2013-05-12 (×4): qty 1

## 2013-05-12 MED ORDER — FAMOTIDINE 20 MG PO TABS
20.0000 mg | ORAL_TABLET | Freq: Two times a day (BID) | ORAL | Status: DC
  Administered 2013-05-12 – 2013-05-13 (×2): 20 mg via ORAL
  Filled 2013-05-12 (×4): qty 1

## 2013-05-12 MED ORDER — ACETAMINOPHEN 325 MG PO TABS
650.0000 mg | ORAL_TABLET | Freq: Four times a day (QID) | ORAL | Status: DC | PRN
  Administered 2013-05-12: 650 mg via ORAL
  Filled 2013-05-12: qty 2

## 2013-05-12 MED ORDER — CLONIDINE HCL 0.2 MG PO TABS
0.2000 mg | ORAL_TABLET | Freq: Once | ORAL | Status: AC
  Administered 2013-05-12: 0.2 mg via ORAL
  Filled 2013-05-12: qty 1

## 2013-05-12 MED ORDER — ASPIRIN EC 81 MG PO TBEC
81.0000 mg | DELAYED_RELEASE_TABLET | Freq: Every day | ORAL | Status: DC
  Administered 2013-05-12 – 2013-05-13 (×2): 81 mg via ORAL
  Filled 2013-05-12 (×2): qty 1

## 2013-05-12 MED ORDER — MAGNESIUM SULFATE 40 MG/ML IJ SOLN
2.0000 g | Freq: Once | INTRAMUSCULAR | Status: AC
  Administered 2013-05-12: 22:00:00 2 g via INTRAVENOUS
  Filled 2013-05-12: qty 50

## 2013-05-12 NOTE — ED Notes (Addendum)
Internal medicine at bedside

## 2013-05-12 NOTE — ED Notes (Signed)
Report given to floor, [facility], RN. Nurse has no further questions upon report given. Pt being prepared for transport to floor.

## 2013-05-12 NOTE — ED Notes (Signed)
Attempted saline lock x 2-unsuccessful- IV team notified.

## 2013-05-12 NOTE — H&P (Signed)
Date: 05/12/2013               Patient Name:  [name] MRN: [id number]  DOB: [birth year] Age / Sex: 52 y.o., female   PCP: No Pcp Per Patient         Medical Service: Internal Medicine Teaching Service         Attending Physician: Dr. Inez Catalina, MD    First Contact: Dr. Delane Ginger Pager: [phone]  Second Contact: Dr. Dorise Hiss Pager: [phone]       After Hours (After 5p/  First Contact Pager: [phone]  weekends / holidays): Second Contact Pager: [phone]   Chief Complaint: Chest Pain  History of Present Illness: Pt is a 52 yo AAF with a PMH of HTN, dyslipidemia and asthma. She presents to the ED with long time chest pain which is worse since Friday and increased this morning. She has non-radiating, non-exertional substernal chest pain with associated diaphoreses without N/V. She states the pain comes and goes and lasts for approximately 15 minutes. She endorses some SOB, cough, and headache. She denies any presyncopal or syncopal episodes. However, she does endorse dyspepsia. She is a smoker of about 1/2 ppd and drinks a 40oz beer 2 x/week. She reports that her friend tricked her into doing some sort of drug on Friday and reports it made her feel depressed and she felt her heart racing. Of note, she reports being out of her clonidine for since Thursday but has been taking lisinopril.   Meds:  Prescriptions prior to admission  Medication Sig Dispense Refill  . acetaminophen (TYLENOL) 500 MG tablet Take 1,000 mg by mouth every 6 (six) hours as needed for pain.      . cloNIDine (CATAPRES) 0.3 MG tablet Take 0.3 mg by mouth 2 (two) times daily.      . mupirocin ointment (BACTROBAN) 2 % Apply 1 application topically daily.      . potassium chloride SA (K-DUR,KLOR-CON) 20 MEQ tablet Take 2 tablets (40 mEq total) by mouth daily.  8 tablet  0  . ranitidine (ZANTAC) 150 MG tablet Take 150 mg by mouth daily.        Current Facility-Administered Medications  Medication Dose Route Frequency  Provider Last Rate Last Dose  . aspirin EC tablet 81 mg  81 mg Oral Daily Judie Bonus, MD      . cloNIDine (CATAPRES) tablet 0.3 mg  0.3 mg Oral BID Judie Bonus, MD      . famotidine (PEPCID) tablet 20 mg  20 mg Oral BID Judie Bonus, MD      . heparin injection 5,000 Units  5,000 Units Subcutaneous Q8H Judie Bonus, MD      . morphine 2 MG/ML injection 1-2 mg  1-2 mg Intravenous Q3H PRN Judie Bonus, MD      . nitroGLYCERIN (NITROSTAT) SL tablet 0.4 mg  0.4 mg Sublingual Q5 min PRN Bryan R Hess, DO      . sodium chloride 0.9 % injection 3 mL  3 mL Intravenous Q12H Judie Bonus, MD        Allergies: Allergies as of 05/12/2013  . (No Known Allergies)   Past Medical History  Diagnosis Date  . Hypertension   . Venous stasis ulcers   . Diastolic heart failure   . Hypokalemia   . Asthma   . Dyslipidemia   . Anemia    History reviewed. No pertinent past surgical history. No family history on file. History  Social History  . Marital Status: Single    Spouse Name: N/A    Number of Children: N/A  . Years of Education: N/A   Occupational History  . Not on file.   Social History Main Topics  . Smoking status: Current Every Day Smoker -- 0.50 packs/day    Types: Cigarettes  . Smokeless tobacco: Not on file  . Alcohol Use: 1.2 oz/week    2 Cans of beer per week  . Drug Use: No  . Sexual Activity:    Other Topics Concern  . Not on file   Social History Narrative  . No narrative on file    Review of Systems: Pertinent items are noted in HPI.  Physical Exam: Blood pressure 182/96, pulse 61, temperature 98.9 F (37.2 C), temperature source Oral, resp. rate 17, height 5\' 8"  (1.727 m), weight 101.4 kg (223 lb 8.7 oz), SpO2 100.00%.  Constitutional: Vital signs reviewed.  Patient is a well-developed and well-nourished female in no acute distress and cooperative with exam.  Head: Normocephalic and atraumatic Eyes: PERRL, EOMI,  conjunctivae normal, No scleral icterus.  Neck: Supple, Trachea midline .  Cardiovascular: RRR, S1 normal, S2 normal, no MRG, pulses symmetric and intact bilaterally Pulmonary/Chest: lower sternal area is tender to palpation; normal respiratory effort, CTAB, no wheezes, rales, or rhonchi Abdominal: Soft. Non-tender, non-distended, bowel sounds are normal, no masses, organomegaly, or guarding present.  Musculoskeletal: No joint deformities, erythema, or stiffness Neurological: A&O x3, cranial nerve II-XII are grossly intact, no focal motor deficit noted. Skin: Warm, dry and intact. No rash, cyanosis, or clubbing; LLE venous stasis ulcers each measuring about 1 X 1cm with visible granulation tissue  Psychiatric: Normal mood and affect.    Lab results: Basic Metabolic Panel:  Recent Labs  96/04/54 0850  NA 137  K 3.3*  CL 98  CO2 29  GLUCOSE 95  BUN 13  CREATININE 1.01  CALCIUM 9.6    CBC:  Recent Labs  05/12/13 0850  WBC 5.2  NEUTROABS 3.0  HGB 12.2  HCT 37.6  MCV 86.4  PLT 385   BNP:  Recent Labs  05/12/13 0850  PROBNP 231.4*   Troponin (Point of Care Test)  Recent Labs  05/12/13 0900  TROPIPOC 0.01     Urinalysis:  Recent Labs  05/12/13 1025  COLORURINE YELLOW  LABSPEC 1.015  PHURINE 7.0  GLUCOSEU NEGATIVE  HGBUR NEGATIVE  BILIRUBINUR NEGATIVE  KETONESUR NEGATIVE  PROTEINUR NEGATIVE  UROBILINOGEN 0.2  NITRITE NEGATIVE  LEUKOCYTESUR NEGATIVE   Imaging results:  Dg Chest 2 View  05/12/2013   CLINICAL DATA:  Chest pain, infected left lower leg sore  EXAM: CHEST  2 VIEW  COMPARISON:  01/04/2013  FINDINGS: The heart size and mediastinal contours are within normal limits. Both lungs are clear. The visualized skeletal structures are stable.  IMPRESSION: No active cardiopulmonary disease.   Electronically Signed   By: Natasha Mead   On: 05/12/2013 08:36    Other results: ED ECG REPORT   Date: 05/12/2013  EKG Time: 07:19AM  Rate: 67  Rhythm:  normal sinus rhythm  Axis: Normal  Intervals: Normal  ST&T Change: None   Assessment & Plan by Problem:  Pt is a 52 yo AAF with a PMH of HTN, dyslipidemia and asthma who presents to the ED with long time chest pain which is worse since Friday and increased this morning.  Principal Problem:   Atypical chest pain-Pt presents with longstanding chest pain that is worse since  Friday that comes and goes and is non-radiating, non-exertional lasting approximately 15 minutes at a time. She has risk factors for ACS, howwever, her TIMI score: 1. Wells Score: 0. She is also tender to palpation on exam in the lower sternal area which goes against cardiac chest pain. CXR reveals no active cardiopulmonary disease and echocardiogram in 2009 revealed normal LVEF of 60% and LV moderate concentric hypertrophy.   -troponins X3 -telemetry -NTG, morphine for pain -BMP,CBC, Mg, LP, HA1C in am -UDS -ASA 81mg  daily -pepcid -diet heart healthy  Active Problems:   Hypertension-BP upon admission was 206/99, however, pt reports being out of clonidine since Thursday; most likely rebound HTN. -Continued home meds.   Hyperlipidemia-LP in the am.   Hypokalemia-pt potassium was 3.3 upon admission -check Mg -replete    Left lower leg wound-does not appear infected-granulation tissue observed; pt is afebrile and has a h/o venous stasis ulcers -consult wound care   Dispo: Disposition is deferred at this time, awaiting improvement of current medical problems. Anticipated discharge in approximately 1 day(s).   The patient does not have a current PCP (No Pcp Per Patient) and does need an [facility] hospital follow-up appointment after discharge.   Signed: Boykin Peek, MD 05/12/2013, 4:55 PM

## 2013-05-12 NOTE — ED Notes (Signed)
Cp since Friday with some sob no n/v has a cough

## 2013-05-12 NOTE — Progress Notes (Signed)
Pt arrived to floor via stretcher requesting to use BR.  Pt ambulated w/o assistance and with standby assist did well.  Oriented to room and verbalized understanding.  Call bell at reach.  Will continue to monitor.  Amanda Pea, Charity fundraiser.

## 2013-05-12 NOTE — ED Provider Notes (Signed)
I saw and evaluated the patient, reviewed the resident's note and I agree with the findings and plan.   .Face to face Exam:  General:  Awake HEENT:  Atraumatic Resp:  Normal effort Abd:  Nondistended Neuro:No focal weakness   Kord Monette L Timithy Arons, MD 05/12/13 2004 

## 2013-05-12 NOTE — ED Notes (Signed)
Patient transported to CT 

## 2013-05-12 NOTE — ED Notes (Signed)
At present pt does not want pain meds.

## 2013-05-12 NOTE — ED Provider Notes (Signed)
CSN: [id number]     Arrival date & time 05/12/13  0710 History   First MD Initiated Contact with Patient 05/12/13 [phone]     Chief Complaint  Patient presents with  . Chest Pain   HPI Comments: Pt is a 52 y/o female with PMHx of HTN, Diastolic CHF, asthma who presents to the ED today with intermittent substernal CP that began on Friday as well as severe L leg pain secondary to chronic venous stasis ulcerations.  Pt states that she has had some intermittent CP since Friday, no inciting event that brought it on, and denies anything that makes it worse for her.  Pt does notice at times that if she has the CP, she is able to rest, which will relieve it somewhat.  Unchanged by position and does not notice an increase or decrease with laying flat, standing up, after meals, worse with activity.  Pain described as sharp, no note of pressure, that gives her tingling down the left arm and nothing into her R arm or jaw.  Denies nausea or vomiting, dizziness, lightheadedness, HA, blurred vision, worsening orthopnea (baseline 1-2 pillows w/ know CHF), worsening PND.  In regards to her left leg pain, started about two weeks ago, has tried Bactrim for the ulcerations but feels that they have not improved.  Has had to call off work on Friday/this morning due to the pain and has noticed some increase edema in her legs, no weakness, paresthesias.  Denies fever, chills, sweats, weight loss, fatigue.  Smoking Hx is 1/2 ppd, occasional alcohol use, no illicit substance use, no early cardiac history in her family    Past Medical History  Diagnosis Date  . Hypertension   . Venous stasis ulcers   . Diastolic heart failure   . Hypokalemia   . Asthma   . Dyslipidemia   . Anemia    History reviewed. No pertinent past surgical history. No family history on file. History  Substance Use Topics  . Smoking status: Current Every Day Smoker -- 0.50 packs/day    Types: Cigarettes  . Smokeless tobacco: Not on file  . Alcohol  Use: 1.2 oz/week    2 Cans of beer per week   OB History   Grav Para Term Preterm Abortions TAB SAB Ect Mult Living                 Review of Systems  Constitutional: Negative for fever, chills, fatigue and unexpected weight change.  HENT: Negative for neck pain and neck stiffness.   Eyes: Negative.   Respiratory: Positive for shortness of breath. Negative for cough, chest tightness and wheezing.   Cardiovascular: Positive for chest pain and leg swelling. Negative for palpitations.  Gastrointestinal: Negative for nausea, vomiting, abdominal pain, diarrhea and constipation.  Endocrine: Negative.   Genitourinary: Negative.   Musculoskeletal: Negative for myalgias, back pain and arthralgias.  Skin: Negative.   Allergic/Immunologic: Negative.   Neurological: Negative for dizziness, syncope, weakness, numbness and headaches.  Hematological: Negative.   Psychiatric/Behavioral: Positive for dysphoric mood.    Allergies  Review of patient's allergies indicates no known allergies.  Home Medications   Current Outpatient Rx  Name  Route  Sig  Dispense  Refill  . acetaminophen (TYLENOL) 500 MG tablet   Oral   Take 1,000 mg by mouth every 6 (six) hours as needed for pain.         . cloNIDine (CATAPRES) 0.3 MG tablet   Oral   Take 0.3 mg by  mouth 2 (two) times daily.         . mupirocin ointment (BACTROBAN) 2 %   Topical   Apply 1 application topically daily.         . potassium chloride SA (K-DUR,KLOR-CON) 20 MEQ tablet   Oral   Take 2 tablets (40 mEq total) by mouth daily.   8 tablet   0   . ranitidine (ZANTAC) 150 MG tablet   Oral   Take 150 mg by mouth daily.          BP 156/90  Pulse 60  Temp(Src) 98.9 F (37.2 C)  Resp 20  SpO2 100% Physical Exam  Constitutional: She appears well-developed and well-nourished. No distress.  HENT:  Head: Normocephalic and atraumatic.  Eyes: Conjunctivae and EOM are normal.  Neck: Normal range of motion. Neck supple.    Cardiovascular: Normal rate, regular rhythm, normal heart sounds and intact distal pulses.   No murmur heard. Pulmonary/Chest: Effort normal and breath sounds normal. No respiratory distress. She has no wheezes. She has no rales. She exhibits no tenderness.  Abdominal: Soft. Bowel sounds are normal. There is no tenderness.  Lymphadenopathy:  + 1 pitting edema B/L, symmetrical   Skin: No ecchymosis, no laceration and no rash noted. She is not diaphoretic. No erythema.     + 2 x 2 cm ulceration, non draining, non erythematous on L leg x 3.  R leg with healing venous stasis ulcer w/ granulation tissue.        ED Course  Procedures (including critical care time)  Date: 05/12/2013  Rate: 68  Rhythm: normal sinus rhythm  QRS Axis: LAD, LAFB  Intervals: nml  ST/T Wave abnormalities: normal  Conduction Disutrbances: 1st degree AVB  Narrative Interpretation: LAFB, LAD, 1st degree AVB unchanged from previous   Old EKG Reviewed: No significant changes noted  Labs Review Labs Reviewed  BASIC METABOLIC PANEL - Abnormal; Notable for the following:    Potassium 3.3 (*)    GFR calc non Af Amer 63 (*)    GFR calc Af Amer 73 (*)    All other components within normal limits  PRO B NATRIURETIC PEPTIDE - Abnormal; Notable for the following:    Pro B Natriuretic peptide (BNP) 231.4 (*)    All other components within normal limits  CBC WITH DIFFERENTIAL  URINALYSIS, ROUTINE W REFLEX MICROSCOPIC  POCT I-STAT TROPONIN I   Imaging Review Dg Chest 2 View  05/12/2013   CLINICAL DATA:  Chest pain, infected left lower leg sore  EXAM: CHEST  2 VIEW  COMPARISON:  01/04/2013  FINDINGS: The heart size and mediastinal contours are within normal limits. Both lungs are clear. The visualized skeletal structures are stable.  IMPRESSION: No active cardiopulmonary disease.   Electronically Signed   By: Natasha Mead   On: 05/12/2013 08:36    MDM   1. Atypical angina   2. Venous stasis ulcers, left   3.  Hypertensive urgency   Pt presents with intermittent sharp ongoing intermittent chest pain that has been ongoing now for three days.  It is substernal, not worsened with activity, relieved with sitting down.  Her Heart score is 4, TIMI score is 2, and her ASCVD is 51.7% calculated with her lipid profile from 2009.  Her chest pain is sharp and intermittent, having been ongoing which is very atypical and she has hx of reflux and gastritis in the past.  Will get CXR, EKG, CBCAD, BMP, BNP, POC troponin, UA, and  give ASA and nitro.  May consider GI cocktail as well if no improvement with Nitro.  Differential is quite broad including ACS, CHF exacerbation, GI origin, stable angina, infection vs bronchoconstriction vs PE.  Very low likelihood of PE as she is not tachycardic, not hypoxic, and not c/o pleuritic CP, can consider D-Dimer but will hold off for now. In regards to her venous stasis ulcerations, no evidence of current infection.  She will need silvadene, dressings/tefla pad, with possible referral to wound care clinic to help with this.  In regards to her HTN urgency, she has similar episodes in the past and states she has been compliant with her medications which include linsiopril/clonidine.  However, question her compliance with a SBP of >200.  Continue to monitor  11:23 AM - Pt lab work has been basically unremarkable, BNP is less elevated than previous from one month ago.  Her BP continues to remain elevated despite a dose of clonidine 0.2 mg.  Will dose her at 0.1 mg q 30 minutes until SBP < 170.  Due to her intermittent ongoing CP, she will need admission for further cardiac w/u.  11:45 AM - Spoke with Dr. Dorise Hiss, IM resident, who will evaluate and admit the patient.    Twana First Paulina Fusi, DO of [facility] 05/12/2013, 3:45 PM     Briscoe Deutscher, DO 05/12/13 1546

## 2013-05-12 NOTE — ED Notes (Signed)
Pt states that she does not want pain medication at this time.  

## 2013-05-13 DIAGNOSIS — I209 Angina pectoris, unspecified: Secondary | ICD-10-CM

## 2013-05-13 LAB — LIPID PANEL
Cholesterol: 187 mg/dL (ref 0–200)
HDL: 38 mg/dL — ABNORMAL LOW (ref >39)
Total CHOL/HDL Ratio: 4.9 RATIO

## 2013-05-13 LAB — HEMOGLOBIN A1C
Hgb A1c MFr Bld: 5.5 % (ref <5.7)
Mean Plasma Glucose: 111 mg/dL (ref <117)

## 2013-05-13 LAB — BASIC METABOLIC PANEL
BUN: 19 mg/dL (ref 6–23)
Calcium: 9.1 mg/dL (ref 8.4–10.5)
GFR calc non Af Amer: 50 mL/min — ABNORMAL LOW (ref >90)
Sodium: 137 mEq/L (ref 135–145)

## 2013-05-13 LAB — CBC
Platelets: 350 10*3/uL (ref 150–400)
RBC: 3.95 MIL/uL (ref 3.87–5.11)
WBC: 4.4 10*3/uL (ref 4.0–10.5)

## 2013-05-13 NOTE — Progress Notes (Signed)
  Date: 05/13/2013  Patient name: [name]  Medical record number: [id number]  Date of birth: [birth year]   This patient has been seen and the plan of care was discussed with the house staff. Please see their note for complete details. I concur with their findings with the following additions/corrections:  [name] has expressed interest in setting up primary care with the [facility].  This will be arranged.  We discussed the importance of not missing doses of her clonidine in future.  Her BP is not controlled, but improved since restarting clonidine, further medication changes can be done as an outpatient.   Inez Catalina, MD 05/13/2013, 11:17 AM

## 2013-05-13 NOTE — Discharge Summary (Signed)
Name: [name] MRN: [id number] DOB: 1961/03/17 52 y.o. PCP: No Pcp Per Patient  Date of Admission: 05/12/2013  7:12 AM Date of Discharge: 05/13/2013 Attending Physician: Inez Catalina, MD  Discharge Diagnosis: Principal Problem:   Substernal chest pain Active Problems:   Hypertension   Hyperlipidemia  Discharge Medications:   Medication List    STOP taking these medications       acetaminophen 500 MG tablet  Commonly known as:  TYLENOL      TAKE these medications       cloNIDine 0.3 MG tablet  Commonly known as:  CATAPRES  Take 0.3 mg by mouth 2 (two) times daily.     mupirocin ointment 2 %  Commonly known as:  BACTROBAN  Apply 1 application topically daily.     potassium chloride SA 20 MEQ tablet  Commonly known as:  K-DUR,KLOR-CON  Take 2 tablets (40 mEq total) by mouth daily.     ranitidine 150 MG tablet  Commonly known as:  ZANTAC  Take 150 mg by mouth daily.        Disposition and follow-up:   [name] was discharged from [facility] in Good condition.  At the hospital follow up visit please address:  1.  Hypertension and medication compliance; further episodes of CP; pt needs a statin and a stress test  2.  Labs / imaging needed at time of follow-up: None  3.  Pending labs/ test needing follow-up: None  Follow-up Appointments:     Follow-up Information   Follow up with Janalyn Harder, MD On 05/27/2013. (8:15am)    Specialty:  Internal Medicine   Contact information:   [address] [place] [address] [phone]       Follow up with [facility]              On 05/15/2013. (1:00pm)    Contact information:   [address] [place] [phone]      Discharge Instructions: Discharge Orders   Future Appointments Provider Department Dept Phone   05/27/2013 8:15 AM Linward Headland, MD [facility] [phone]   Future  Orders Complete By Expires   Call MD for:  difficulty breathing, headache or visual disturbances  As directed    Diet - low sodium heart healthy  As directed    Increase activity slowly  As directed       Consultations:  None  Procedures Performed:  Dg Chest 2 View  05/12/2013   CLINICAL DATA:  Chest pain, infected left lower leg sore  EXAM: CHEST  2 VIEW  COMPARISON:  01/04/2013  FINDINGS: The heart size and mediastinal contours are within normal limits. Both lungs are clear. The visualized skeletal structures are stable.  IMPRESSION: No active cardiopulmonary disease.   Electronically Signed   By: Natasha Mead   On: 05/12/2013 08:36    2D Echo: None  Cardiac Cath: None  Admission HPI: Pt is a 52 yo AAF with a PMH of HTN, dyslipidemia and asthma. She presents to the ED with long time chest pain which is worse since Friday and increased this morning. She has non-radiating, non-exertional substernal chest pain with associated diaphoreses without N/V. She states the pain comes and goes and lasts for approximately 15 minutes. She endorses some SOB, cough, and headache. She denies any presyncopal or syncopal episodes. However, she does endorse dyspepsia. She is a smoker of about  1/2 ppd and drinks a 40oz beer 2 x/week. She reports that her friend tricked her into doing some sort of drug on Friday and reports it made her feel depressed and she felt her heart racing. Of note, she reports being out of her clonidine for since Thursday but has been taking lisinopril.    Hospital Course by problem list: Principal Problem:   Atypical chest pain- Pt presents with longstanding chest pain that is was since Friday that comes and goes and is non-radiating, non-exertional lasting approximately 15 minutes at a time. She has risk factors for ACS, however, her TIMI score: 1. Wells Score: 0. She is also tender to palpation on exam in the lower sternal area which goes against cardiac chest pain. CXR reveals no active  cardiopulmonary disease and echocardiogram in 2009 revealed normal LVEF of 60% and LV moderate concentric hypertrophy. She does report consuming an unknown powder from a friend which could have contributed to her chest pain. Other possible but etiologies include: PE (doubtful given Wells Score 0 and no other risk factors), aortic dissection (negative CXR), GERD (pt does report dyspepsia), and musculoskeletal (likely given pain is reproducible to palpation). During her hospital admission, EKG showed normal sinus rhythm with LVH but no acute infarct. CXR showed no active cardiopulmonary disease. Pt was placed on telemetry and given NTG, morphine for pain. She was started on an 81mg  ASA and a heart healthy diet. Serial troponins were negative. BMP, cbc, Mg, and HA1C were relatively normal. She was also given pepcid for dyspepsia. The next morning, the chest pain had resolved and pt was discharged in good condition. She will need to have a stress test completed as an outpatient given her recurrent chest pain and risk factors for CAD.   Active Problems:   Hypertension- Pt reports being out of her home clonidine for several days but reports taking her lisinopril. Upon admission, her BP was elevated at 206/99. We continued her home dosage of clonidine and lisinopril. Upon discharge her BP had trended down to 167/88.    Hyperlipidemia-Lipid panel showed HDL of 38 and LDL 129. Her ASCVD lifetime risk is 50% which indicates a moderate to high intensity statin is recommended.    Left lower leg wound- Upon admission, pt had 2 small 1cm X 1cm on her left lower extremity which she had been going to the wound care clinic. There was no surrounding erythema, warmth, or drainage but had the appearance of granulation tissue. During her hospital stay, the wounds were bandaged and an appointment was made for her to follow up at the wound clinic upon discharge.    Discharge Vitals:   BP 167/88  Pulse 66  Temp(Src) 98 F (36.7  C) (Oral)  Resp 18  Ht 5\' 8"  (1.727 m)  Wt 101.651 kg (224 lb 1.6 oz)  BMI 34.08 kg/m2  SpO2 100%  Discharge Labs:  Results for orders placed during the hospital encounter of 05/12/13 (from the past 24 hour(s))  MAGNESIUM     Status: None   Collection Time    05/12/13  6:30 PM      Result Value Range   Magnesium 1.7  1.5 - 2.5 mg/dL  TROPONIN I     Status: None   Collection Time    05/12/13  6:30 PM      Result Value Range   Troponin I <0.30  <0.30 ng/mL  HEMOGLOBIN A1C     Status: None   Collection Time    05/12/13  7:30 PM      Result Value Range   Hemoglobin A1C 5.5  <5.7 %   Mean Plasma Glucose 111  <117 mg/dL  TROPONIN I     Status: None   Collection Time    05/12/13 10:05 PM      Result Value Range   Troponin I <0.30  <0.30 ng/mL  TROPONIN I     Status: None   Collection Time    05/13/13  4:37 AM      Result Value Range   Troponin I <0.30  <0.30 ng/mL  BASIC METABOLIC PANEL     Status: Abnormal   Collection Time    05/13/13  4:37 AM      Result Value Range   Sodium 137  135 - 145 mEq/L   Potassium 3.5  3.5 - 5.1 mEq/L   Chloride 98  96 - 112 mEq/L   CO2 30  19 - 32 mEq/L   Glucose, Bld 99  70 - 99 mg/dL   BUN 19  6 - 23 mg/dL   Creatinine, Ser 4.54 (*) 0.50 - 1.10 mg/dL   Calcium 9.1  8.4 - 09.8 mg/dL   GFR calc non Af Amer 50 (*) >90 mL/min   GFR calc Af Amer 57 (*) >90 mL/min  CBC     Status: Abnormal   Collection Time    05/13/13  4:37 AM      Result Value Range   WBC 4.4  4.0 - 10.5 K/uL   RBC 3.95  3.87 - 5.11 MIL/uL   Hemoglobin 11.0 (*) 12.0 - 15.0 g/dL   HCT 11.9 (*) 14.7 - 82.9 %   MCV 86.8  78.0 - 100.0 fL   MCH 27.8  26.0 - 34.0 pg   MCHC 32.1  30.0 - 36.0 g/dL   RDW 56.2  13.0 - 86.5 %   Platelets 350  150 - 400 K/uL  LIPID PANEL     Status: Abnormal   Collection Time    05/13/13  4:37 AM      Result Value Range   Cholesterol 187  0 - 200 mg/dL   Triglycerides 784  <696 mg/dL   HDL 38 (*) >29 mg/dL   Total CHOL/HDL Ratio 4.9      VLDL 20  0 - 40 mg/dL   LDL Cholesterol 528 (*) 0 - 99 mg/dL    Signed: Boykin Peek, MD 05/13/2013, 2:06 PM   Time Spent on Discharge: 45 minutes Services Ordered on Discharge: None Equipment Ordered on Discharge: None

## 2013-05-13 NOTE — Progress Notes (Signed)
Subjective:  Pt reports resolution of chest pain. She has no further complaints this am.  Objective: Vital signs in last 24 hours: Filed Vitals:   05/12/13 1620 05/12/13 2128 05/13/13 0357 05/13/13 0419  BP: 182/96 155/92  153/87  Pulse: 61 66  66  Temp: 98.9 F (37.2 C) 98.3 F (36.8 C)  98 F (36.7 C)  TempSrc: Oral Oral  Oral  Resp: 17 18  18   Height: 5\' 8"  (1.727 m)     Weight: 101.4 kg (223 lb 8.7 oz)  101.651 kg (224 lb 1.6 oz)   SpO2: 100% 100%  100%   Weight change:   Intake/Output Summary (Last 24 hours) at 05/13/13 1006 Last data filed at 05/13/13 4098  Gross per 24 hour  Intake    720 ml  Output      0 ml  Net    720 ml   Constitutional: Vital signs reviewed. Patient is a well-developed and well-nourished female in no acute distress and cooperative with exam.  Head: Normocephalic and atraumatic  Eyes: PERRL, EOMI, conjunctivae normal, No scleral icterus.  Neck: Supple, Trachea midline .  Cardiovascular: RRR, S1 normal, S2 normal, no MRG, pulses symmetric and intact bilaterally  Pulmonary/Chest: lower sternal area is tender to palpation; normal respiratory effort, CTAB, no wheezes, rales, or rhonchi  Abdominal: Soft. Non-tender, non-distended, bowel sounds are normal, no masses, organomegaly, or guarding present.  Musculoskeletal: No joint deformities, erythema, or stiffness Neurological: A&O x3, cranial nerve II-XII are grossly intact, no focal motor deficit noted.  Skin: Warm, dry and intact. No rash, cyanosis, or clubbing; LLE venous stasis ulcers each measuring about 1 X 1cm with visible granulation tissue  Psychiatric: Normal mood and affect.    Lab Results: Basic Metabolic Panel:  Recent Labs Lab 05/12/13 0850 05/12/13 1830 05/13/13 0437  NA 137  --  137  K 3.3*  --  3.5  CL 98  --  98  CO2 29  --  30  GLUCOSE 95  --  99  BUN 13  --  19  CREATININE 1.01  --  1.23*  CALCIUM 9.6  --  9.1  MG  --  1.7  --    CBC:  Recent Labs Lab  05/12/13 0850 05/13/13 0437  WBC 5.2 4.4  NEUTROABS 3.0  --   HGB 12.2 11.0*  HCT 37.6 34.3*  MCV 86.4 86.8  PLT 385 350   Cardiac Enzymes:  Recent Labs Lab 05/12/13 1830 05/12/13 2205 05/13/13 0437  TROPONINI <0.30 <0.30 <0.30   BNP:  Recent Labs Lab 05/12/13 0850  PROBNP 231.4*   Hemoglobin A1C:  Recent Labs Lab 05/12/13 1930  HGBA1C 5.5   Fasting Lipid Panel:  Recent Labs Lab 05/13/13 0437  CHOL 187  HDL 38*  LDLCALC 129*  TRIG 101  CHOLHDL 4.9   Urine Drug Screen: Drugs of Abuse     Component Value Date/Time   LABOPIA POSITIVE* 12/30/2010 0436   COCAINSCRNUR NONE DETECTED 12/30/2010 0436   LABBENZ NONE DETECTED 12/30/2010 0436   AMPHETMU NONE DETECTED 12/30/2010 0436   THCU NONE DETECTED 12/30/2010 0436   LABBARB  Value: NONE DETECTED        DRUG SCREEN FOR MEDICAL PURPOSES ONLY.  IF CONFIRMATION IS NEEDED FOR ANY PURPOSE, NOTIFY LAB WITHIN 5 DAYS.        LOWEST DETECTABLE LIMITS FOR URINE DRUG SCREEN Drug Class       Cutoff (ng/mL) Amphetamine      1000 Barbiturate  200 Benzodiazepine   200 Tricyclics       300 Opiates          300 Cocaine          300 THC              50 12/30/2010 0436   Urinalysis:  Recent Labs Lab 05/12/13 1025  COLORURINE YELLOW  LABSPEC 1.015  PHURINE 7.0  GLUCOSEU NEGATIVE  HGBUR NEGATIVE  BILIRUBINUR NEGATIVE  KETONESUR NEGATIVE  PROTEINUR NEGATIVE  UROBILINOGEN 0.2  NITRITE NEGATIVE  LEUKOCYTESUR NEGATIVE    Studies/Results: Dg Chest 2 View  05/12/2013   CLINICAL DATA:  Chest pain, infected left lower leg sore  EXAM: CHEST  2 VIEW  COMPARISON:  01/04/2013  FINDINGS: The heart size and mediastinal contours are within normal limits. Both lungs are clear. The visualized skeletal structures are stable.  IMPRESSION: No active cardiopulmonary disease.   Electronically Signed   By: Natasha Mead   On: 05/12/2013 08:36   Medications: I have reviewed the patient's current medications. Scheduled Meds: . aspirin EC  81 mg  Oral Daily  . cloNIDine  0.3 mg Oral BID  . famotidine  20 mg Oral BID  . heparin  5,000 Units Subcutaneous Q8H  . sodium chloride  3 mL Intravenous Q12H   Continuous Infusions:  PRN Meds:.acetaminophen, morphine injection, nitroGLYCERIN Assessment/Plan:  Atypical chest pain-Pt presents with longstanding chest pain that is worse since Friday that comes and goes and is non-radiating, non-exertional lasting approximately 15 minutes at a time. She does report taking an unknown powder substance from a friend last week. She has risk factors for ACS, howwever, her TIMI score: 1. Wells Score: 0. She is also tender to palpation on exam in the lower sternal area which goes against cardiac chest pain. CXR reveals no active cardiopulmonary disease and echocardiogram in 2009 revealed normal LVEF of 60% and LV moderate concentric hypertrophy. During her hospital course, she received nitroglycerin and morphine for pain as needed. She also received a 325mg  ASA and subsequent 81mg  ASA. She was placed on telemetry and a heart healthy diet. Troponins X 3 have been negative. We also gave her pepcid. We checked an HA1C which came back normal. Lipid panel checked and showed low HDL of 38 and High LDL of 161. Pt reports resolution of her chest pain this morning. However, she does report occasional chest pain with exertion which is concerning and she has risk factors for CAD. We will recommend further evaluation of her chest pain as an outpatient to include a stress test. Additionally, we advised her not to take any further unknown substances from her friends as they may have also contributed to her chest pain.  -stress test as an outpatient -f/u with [facility]  Active Problems:  Hypertension-BP upon admission was 206/99, however, pt reports being out of clonidine since Thursday; most likely rebound HTN.  -Continued home meds.  Hyperlipidemia-Lipid panel showed LDL of 38 and HDL of 129. Hypokalemia-pt potassium was 3.3 upon  admission. Check magnesium was normal at 1.7. She was given 2 grams of magnesium.   Left lower leg wound-does not appear infected-granulation tissue observed; pt is afebrile and has a h/o venous stasis ulcers. We consulted wound care for wrapping and to follow up with them as an outpatient.    Dispo: Patient to be discharged today.    .Services Needed at time of discharge: Y = Yes, Blank = No PT:   OT:   RN:   Equipment:  Other:     LOS: 1 day   Boykin Peek, MD 05/13/2013, 10:06 AM

## 2013-05-13 NOTE — Progress Notes (Signed)
Nutrition Brief Note  Patient identified on the Malnutrition Screening Tool (MST) Report  Wt Readings from Last 15 Encounters:  05/13/13 224 lb 1.6 oz (101.651 kg)    Body mass index is 34.08 kg/(m^2). Patient meets criteria for Obesity based on current BMI.   Current diet order is Heart Healthy, patient is consuming approximately 100% of meals at this time. Pt asleep at time of visit; per nursing notes pt is eating well. Per MD note, pt is to be discharged today. Labs and medications reviewed.   No nutrition interventions warranted at this time. If nutrition issues arise, please consult RD.   Ian Malkin RD, LDN Inpatient Clinical Dietitian Pager: [phone] After Hours Pager: [phone]

## 2013-05-13 NOTE — Progress Notes (Signed)
Orthopedic Tech Progress Note Patient Details:  [name] 1961/07/27 [id number] Unna boot applied to Left LE. Application tolerated well.  Ortho Devices Type of Ortho Device: Radio broadcast assistant Ortho Device/Splint Location: Left LE Ortho Device/Splint Interventions: Application   Asia R Thompson 05/13/2013, 12:18 PM

## 2013-05-13 NOTE — Progress Notes (Signed)
Pt. Alert and oriented this am. No s/s of distress noted. Pt. C/o L  leg pain 8/10 during the night. On call MD made aware. PRN pain medication administered. Pt. Able to rest during the night. RN will continue to monitor pt. For changes in condition. Kennya Schwenn, Cheryll Dessert

## 2013-05-13 NOTE — Progress Notes (Signed)
All d/c instructions explained and given to pt.  Verbalized understanding.  Pt stated waiting for excuse from work.  Dr. Sharlene Motts informed and instructed will she will bring it up.  Amanda Pea, Charity fundraiser.

## 2013-05-13 NOTE — Progress Notes (Signed)
Pt d/c off floor to home via w/c by NT. To awaiting transport.  Amanda Pea, Charity fundraiser.

## 2013-05-13 NOTE — H&P (Signed)
  Date: 05/13/2013  Patient name: [name]  Medical record number: [id number]  Date of birth: 05-09-1961   I have seen and evaluated [name] and discussed their care with the Residency Team.  [name] reports a long history of chest pain, which is substernal and worse when she is under a lot of stress, particularly when she is working.  She has associated diaphoresis but no SOB or nausea.  The pain is reproducible and sometimes hurts to take a deep breath.  She does smoke and has a h/o HTN and HLD.  She is on clonidine which she missed her doses since Thursday last week.  She also took some drug, that was a powder, from a friend on Friday.    Assessment and Plan: I have seen and evaluated the patient as outlined above. I agree with the formulated Assessment and Plan as detailed in the residents' admission note, with the following changes:   1. Acute on Chronic Chest pain: TIMI score is 1, concern for ACS is moderate given history and smoking.  Will admit for ACS rule out.  Other causes of her chest pain could be due to illicit drug ingestions (? Cocaine), missing clonidine and malignancy HTN, dyspepsia.  If she has CE that are negative, she would likely be okay for an outpatient stress test.  Well's score is 0.  Monitor on Telemetry, UDS, NTG and morphine for breakthrough pain.   2. HTN: Acutely elevated in the ED, restart home clonidine.  This elevation is likely due to rebound HTN from stopping clonidine and/or drug ingestion  3. Left leg wound: Wound care evaluation, re-establish with outpatient wound care.   We discussed her need for PCP and she would like to establish care in our clinic.  This will be arranged.   Summer Catalina, MD 9/23/201410:14 AM

## 2013-05-13 NOTE — Progress Notes (Signed)
Utilization Review Completed.   Ladislao Cohenour, RN, BSN Nurse Case Manager  [phone]  

## 2013-05-13 NOTE — Progress Notes (Signed)
Orthopedic Tech Progress Note Patient Details:  [name] [birth year] [id number] Spoke with nurse. Application of bandage to wound before unna boot is to be applied has not been done yet. Nursing to page Ortho for unna boot once their wound application is complete.  Patient ID: [name], female   DOB: 08-03-1961, 52 y.o.   MRN: [id number]   [name] 05/13/2013, 10:04 AM

## 2013-05-13 NOTE — Consult Note (Signed)
WOC consult Note Reason for Consult: Venous stasis ulcers to Left leg.  Wound type: Venous stasis ulcers x 2. Measurement: Upper lateral calf 1 cm x 1 cm x 0.2 cm.  Lower calf near malleolus 1 cm x 1 cm x 0.2 cm Wound bed: Both wounds have 100% pink wound bed.  Drainage (amount, consistency, odor) Minimal serous drainage.  Non pitting edema present in leg from foot to below knee.  History of Venous stasis disease and healed ulcer noted to Right leg using Unnas boots.   Periwound: Intact Dressing procedure/placement/frequency: Recommend Hydrogel impregnated gauze to wound bed, top with dry dressing and apply Unnas boots twice weekly.  Will not follow at this time.  Please re-consult if needed.  Maple Hudson RN BSN CWON Pager [phone]

## 2013-05-15 ENCOUNTER — Encounter (HOSPITAL_BASED_OUTPATIENT_CLINIC_OR_DEPARTMENT_OTHER): Payer: MEDICAID

## 2013-05-19 NOTE — Discharge Summary (Signed)
There was thought that her chest pain was related to the producible pain in her sternum or to the abrupt cessation of her clonidine and hypertension.  Acute ACS was ruled out with serial troponin testing.  I saw [name] on the day of discharge and assisted in the discharge planning.

## 2013-05-27 ENCOUNTER — Encounter: Payer: Self-pay | Admitting: Internal Medicine

## 2013-05-27 ENCOUNTER — Ambulatory Visit (INDEPENDENT_AMBULATORY_CARE_PROVIDER_SITE_OTHER): Payer: Self-pay | Admitting: Internal Medicine

## 2013-05-27 VITALS — BP 202/109 | HR 80 | Temp 97.9°F | Ht 69.0 in | Wt 230.6 lb

## 2013-05-27 DIAGNOSIS — R072 Precordial pain: Secondary | ICD-10-CM

## 2013-05-27 DIAGNOSIS — I10 Essential (primary) hypertension: Secondary | ICD-10-CM

## 2013-05-27 MED ORDER — LISINOPRIL 20 MG PO TABS: 40.0000 mg | ORAL_TABLET | Freq: Every day | ORAL | Status: DC

## 2013-05-27 NOTE — Patient Instructions (Signed)
General Instructions: Your blood pressure is very high today.  To try to lower your blood pressure, we are making the following changes: 1. STOP taking Clonidine 2. INCREASE Lisinopril to 40 mg daily (2 tabs per day) 3. STOP taking K-Dur (potassium supplement)  We are referring you to Cardiology for a stress test.  We will contact you with an appointment.  Come back in 1 week for a blood pressure re-check, and so we can check your potassium levels   Treatment Goals:  Goals (1 Years of Data) as of 05/27/13   None      Progress Toward Treatment Goals:  Treatment Goal 05/27/2013  Blood pressure deteriorated  Stop smoking smoking the same amount    Self Care Goals & Plans:  Self Care Goal 05/27/2013  Manage my medications take my medicines as prescribed; bring my medications to every visit  Monitor my health keep track of my blood pressure  Eat healthy foods eat foods that are low in salt; eat more vegetables  Stop smoking call [facility] (1-800-QUIT-NOW)       Care Management & Community Referrals:

## 2013-05-27 NOTE — Assessment & Plan Note (Signed)
BP Readings from Last 3 Encounters:  05/27/13 202/109  05/13/13 167/88  01/06/13 208/109    Lab Results  Component Value Date   NA 137 05/13/2013   K 3.5 05/13/2013   CREATININE 1.23* 05/13/2013    Assessment: Blood pressure control: severely elevated Progress toward BP goal:  deteriorated Comments: The patient's blood pressure is significantly elevated today, likely representing poor baseline control, and rebound hypertension from inconsistent clonidine use. The patient has had hypokalemia in the past, though the details surrounding this are unclear.  Primary hyperaldosteronism is a consideration.  We will stop potassium supplementation today, and assess potassium level at next visit, to select the most appropriate next agent for controlling blood pressure.  Plan: Medications:  STOP clonidine and k-dur.  Increase lisinopril from 20 to 40 mg daily. Educational resources provided: brochure Self management tools provided: home blood pressure logbook Other plans: At next visit, recheck BP and check potassium.

## 2013-05-27 NOTE — Progress Notes (Signed)
Case discussed with Dr. Brown at the time of the visit.  We reviewed the resident's history and exam and pertinent patient test results.  I agree with the assessment, diagnosis and plan of care documented in the resident's note. 

## 2013-05-27 NOTE — Progress Notes (Signed)
HPI The patient is a 52 y.o. female with a history of HTN, HL, presenting for a hospital follow-up.  The patient was recently hospitalized 9/22-9/23 with chest pain, ruled out for acute MI.  No chest pain since hospital discharge.  During the patient's hospitalization, her blood pressure was significant elevated, attributed to running out of clonidine. Today the patient's blood pressure remained significantly elevated.  She states that she is out of clonidine, due to not having the money to afford it.  The patient is taking lisinopril 20 mg daily (23 days/month), and notes only taking clonidine once/day (at night) due to daytime sleepiness which she attributes to this medication.  ROS: General: no fevers, chills, changes in weight, changes in appetite Skin: no rash HEENT: no blurry vision, hearing changes, sore throat Pulm: no dyspnea, coughing, wheezing CV: no chest pain, palpitations, shortness of breath Abd: no abdominal pain, nausea/vomiting, diarrhea/constipation GU: no dysuria, hematuria, polyuria Ext: no arthralgias, myalgias Neuro: no weakness, numbness, or tingling  Filed Vitals:   05/27/13 0830  BP: 202/109  Pulse: 80  Temp: 97.9 F (36.6 C)    PEX General: alert, cooperative, and in no apparent distress HEENT: pupils equal round and reactive to light, vision grossly intact, oropharynx clear and non-erythematous  Neck: supple Lungs: clear to ascultation bilaterally, normal work of respiration, no wheezes, rales, ronchi Heart: regular rate and rhythm, grade III/VI systolic murmur appreciated, gallops, or rubs Abdomen: soft, non-tender, non-distended, normal bowel sounds Extremities: no cyanosis, clubbing, or edema Neurologic: alert & oriented X3, cranial nerves II-XII intact, strength grossly intact, sensation intact to light touch  Current Outpatient Prescriptions on File Prior to Visit  Medication Sig Dispense Refill  . cloNIDine (CATAPRES) 0.3 MG tablet Take 0.3 mg  by mouth 2 (two) times daily.      . mupirocin ointment (BACTROBAN) 2 % Apply 1 application topically daily.      . potassium chloride SA (K-DUR,KLOR-CON) 20 MEQ tablet Take 2 tablets (40 mEq total) by mouth daily.  8 tablet  0  . ranitidine (ZANTAC) 150 MG tablet Take 150 mg by mouth daily.       No current facility-administered medications on file prior to visit.    Assessment/Plan

## 2013-05-27 NOTE — Assessment & Plan Note (Signed)
The patient notes no additional chest pain since hospital discharge. As recommended by the inpatient team, we will pursue outpatient stress testing. The patient is in the process of applying for Medicaid and the orange card.  Will defer referral for now, and complete once patient obtains one of these. -pt given [employer] paperwork and referred to [employer] -will plan for Cardiology referral once this has been completed.

## 2013-06-03 ENCOUNTER — Ambulatory Visit: Payer: Self-pay

## 2013-06-03 ENCOUNTER — Telehealth: Payer: Self-pay | Admitting: *Deleted

## 2013-06-03 ENCOUNTER — Ambulatory Visit (INDEPENDENT_AMBULATORY_CARE_PROVIDER_SITE_OTHER): Payer: Self-pay | Admitting: Internal Medicine

## 2013-06-03 ENCOUNTER — Encounter: Payer: Self-pay | Admitting: Internal Medicine

## 2013-06-03 VITALS — BP 219/116 | HR 82 | Temp 97.8°F | Ht 68.0 in | Wt 231.1 lb

## 2013-06-03 DIAGNOSIS — IMO0001 Reserved for inherently not codable concepts without codable children: Secondary | ICD-10-CM

## 2013-06-03 DIAGNOSIS — I83009 Varicose veins of unspecified lower extremity with ulcer of unspecified site: Secondary | ICD-10-CM | POA: Insufficient documentation

## 2013-06-03 DIAGNOSIS — I10 Essential (primary) hypertension: Secondary | ICD-10-CM

## 2013-06-03 MED ORDER — VASELINE PETROLATUM GAUZE EX PADS: 1.0000 | MEDICATED_PAD | Freq: Every day | CUTANEOUS | Status: DC

## 2013-06-03 NOTE — Progress Notes (Signed)
HPI The patient is a 52 y.o. female with a history of HTN, presenting for an acute visit for leg wound.  The patient has a history of 2 small venous stasis ulcers on her left lower leg, previously treated at the wound care center.  She has been covering these areas with dry gauze.  Per her report, she notes removing her dry gauze dressing this morning, and that the skin on the back of her leg "peeled off".  She notes that the area is painful, but not draining.  The patient has a history of HTN.  BP was significantly elevated at her last visit, in the setting of non-compliance with clonidine and lisinopril.  Clonidine was stopped, and lisinopril was increased.  Today, BP remains significantly elevated.  ROS: General: no fevers, chills, changes in weight, changes in appetite Skin: no rash HEENT: no blurry vision, hearing changes, sore throat Pulm: no dyspnea, coughing, wheezing CV: no chest pain, palpitations, shortness of breath Abd: no abdominal pain, nausea/vomiting, diarrhea/constipation GU: no dysuria, hematuria, polyuria Ext: no arthralgias, myalgias Neuro: no weakness, numbness, or tingling  Filed Vitals:   06/03/13 1617  BP: 219/116  Pulse: 82  Temp: 97.8 F (36.6 C)    PEX General: alert, cooperative, and in no apparent distress HEENT: pupils equal round and reactive to light, vision grossly intact, oropharynx clear and non-erythematous  Neck: supple Lungs: clear to ascultation bilaterally, normal work of respiration, no wheezes, rales, ronchi Heart: regular rate and rhythm, no murmurs, gallops, or rubs Abdomen: soft, non-tender, non-distended, normal bowel sounds Extremities: 2+ pitting edema noted bilaterally.  Large area of skin breakdown on left lower posterior leg with no surrounding erythema Neurologic: alert & oriented X3, cranial nerves II-XII intact, strength grossly intact, sensation intact to light touch  Current Outpatient Prescriptions on File Prior to Visit   Medication Sig Dispense Refill  . lisinopril (PRINIVIL,ZESTRIL) 20 MG tablet Take 2 tablets (40 mg total) by mouth daily.  60 tablet  11  . mupirocin ointment (BACTROBAN) 2 % Apply 1 application topically daily.       No current facility-administered medications on file prior to visit.    Assessment/Plan

## 2013-06-03 NOTE — Telephone Encounter (Signed)
Pt walked in to clinic has open wound low left leg. Talked with Dr Manson Passey if pt cont with [facility]. Had been seeing Wound Center for right leg. Talked with Tyler Aas about assigning PCP. Appt given for today 3:45PM Dr Manson Passey. Stanton Kidney Mylei Brackeen RN 06/03/13 4PM

## 2013-06-03 NOTE — Assessment & Plan Note (Addendum)
BP Readings from Last 3 Encounters:  06/03/13 219/116  05/27/13 202/109  05/13/13 167/88    Lab Results  Component Value Date   NA 137 05/13/2013   K 3.5 05/13/2013   CREATININE 1.23* 05/13/2013    Assessment: Blood pressure control: severely elevated Progress toward BP goal:  unchanged Comments: BP remains significantly elevated.  Will add diuretic today, given concomitant LE edema.  Check BMET given increase in lisinopril at last week's visit, and history of hypokalemia.  I continue to worry about primary hyperaldosteronism, but will defer PAC/PRA ratio until pt obtains orange card.  Plan: Medications:  Continue lisinopril, add diuretic pending BMET results Educational resources provided:   Self management tools provided:   Other plans: Check PAC/PRA ratio when pt obtains orange card   Addendum 10/16: BMET continued to show low K at 3.5.  Though I'm still concerned about primary hyperaldosteronism, we can't order the test until she obtains the orange card due to cost reasons.  Since she needs a diuretic for her venous stasis ulcers, I'm prescribing spironolactone.  At some point in the future, she will need a PAC/PRA ratio while off of spironolactone, but we will pursue this once the patient has the orange card and her current problems are more stable.

## 2013-06-03 NOTE — Assessment & Plan Note (Addendum)
The patient notes a new ulcer on her left lower posterior leg, which appears consistent with a venous stasis ulcer.  Patient first noticed this ulcer today, and believes it was due to shear stress from removing an adhesive bandage, but the appearance suggests more of a subacute process due to venous stasis. -cover the area with vaseline gauze to reduce shear stress -use compression stockings to reduce swelling -will start diuretic (checking BMET today first) -referral to wound care center

## 2013-06-03 NOTE — Patient Instructions (Signed)
General Instructions: Your leg wound represents what is called a Venous Stasis Ulcer -keep the wound covered with Vaseline gauze -continue to wrap and elevate your legs to decrease swelling -we will prescribe a fluid pill to help decrease swelling, based on the results of your lab work today -we are referring you back to the wound care center  Your blood pressure is very elevated today. -continue taking Lisinopril -we are prescribing a fluid pill, based on your potassium levels today  Please return for a BP recheck in 1 week   Treatment Goals:  Goals (1 Years of Data) as of 06/03/13   None      Progress Toward Treatment Goals:  Treatment Goal 06/03/2013  Blood pressure unchanged  Stop smoking -    Self Care Goals & Plans:  Self Care Goal 06/03/2013  Manage my medications take my medicines as prescribed; bring my medications to every visit; refill my medications on time  Monitor my health keep track of my blood pressure  Eat healthy foods eat foods that are low in salt  Stop smoking go to the [employer] ([email or web address]); call [employer] (1-800-QUIT-NOW)    No flowsheet data found.   Care Management & Community Referrals:  Referral 06/03/2013  Referrals made for care management support none needed

## 2013-06-04 LAB — BASIC METABOLIC PANEL
BUN: 20 mg/dL (ref 6–23)
CO2: 30 mEq/L (ref 19–32)
Creat: 1.32 mg/dL — ABNORMAL HIGH (ref 0.50–1.10)
Glucose, Bld: 102 mg/dL — ABNORMAL HIGH (ref 70–99)
Potassium: 3.5 mEq/L (ref 3.5–5.3)

## 2013-06-04 NOTE — Progress Notes (Signed)
Case discussed with Dr. Brown at the time of the visit.  We reviewed the resident's history and exam and pertinent patient test results.  I agree with the assessment, diagnosis and plan of care documented in the resident's note. 

## 2013-06-05 MED ORDER — SPIRONOLACTONE 100 MG PO TABS: 100.0000 mg | ORAL_TABLET | Freq: Every day | ORAL | Status: DC

## 2013-06-05 NOTE — Addendum Note (Signed)
Addended by: Linward Headland on: 06/05/2013 11:45 AM   Modules accepted: Orders

## 2013-06-11 ENCOUNTER — Other Ambulatory Visit: Payer: Self-pay

## 2013-06-11 ENCOUNTER — Encounter (HOSPITAL_COMMUNITY): Payer: Self-pay | Admitting: *Deleted

## 2013-06-11 ENCOUNTER — Observation Stay (HOSPITAL_COMMUNITY): Payer: MEDICAID

## 2013-06-11 ENCOUNTER — Inpatient Hospital Stay (HOSPITAL_COMMUNITY)
Admission: EM | Admit: 2013-06-11 | Discharge: 2013-06-13 | DRG: 603 | Disposition: A | Discharge status: Home / Self Care | Payer: Self-pay | Attending: Infectious Diseases | Admitting: Infectious Diseases

## 2013-06-11 DIAGNOSIS — L03119 Cellulitis of unspecified part of limb: Principal | ICD-10-CM | POA: Diagnosis present

## 2013-06-11 DIAGNOSIS — E876 Hypokalemia: Secondary | ICD-10-CM | POA: Diagnosis present

## 2013-06-11 DIAGNOSIS — L97909 Non-pressure chronic ulcer of unspecified part of unspecified lower leg with unspecified severity: Secondary | ICD-10-CM | POA: Diagnosis present

## 2013-06-11 DIAGNOSIS — I5032 Chronic diastolic (congestive) heart failure: Secondary | ICD-10-CM | POA: Diagnosis present

## 2013-06-11 DIAGNOSIS — I83009 Varicose veins of unspecified lower extremity with ulcer of unspecified site: Secondary | ICD-10-CM | POA: Diagnosis present

## 2013-06-11 DIAGNOSIS — M7989 Other specified soft tissue disorders: Secondary | ICD-10-CM

## 2013-06-11 DIAGNOSIS — L03116 Cellulitis of left lower limb: Secondary | ICD-10-CM

## 2013-06-11 DIAGNOSIS — K219 Gastro-esophageal reflux disease without esophagitis: Secondary | ICD-10-CM | POA: Diagnosis present

## 2013-06-11 DIAGNOSIS — I10 Essential (primary) hypertension: Secondary | ICD-10-CM | POA: Diagnosis present

## 2013-06-11 DIAGNOSIS — I872 Venous insufficiency (chronic) (peripheral): Secondary | ICD-10-CM | POA: Diagnosis present

## 2013-06-11 DIAGNOSIS — I5189 Other ill-defined heart diseases: Secondary | ICD-10-CM | POA: Diagnosis present

## 2013-06-11 DIAGNOSIS — I517 Cardiomegaly: Secondary | ICD-10-CM

## 2013-06-11 DIAGNOSIS — D649 Anemia, unspecified: Secondary | ICD-10-CM | POA: Diagnosis present

## 2013-06-11 DIAGNOSIS — E785 Hyperlipidemia, unspecified: Secondary | ICD-10-CM | POA: Diagnosis present

## 2013-06-11 DIAGNOSIS — L02419 Cutaneous abscess of limb, unspecified: Principal | ICD-10-CM | POA: Diagnosis present

## 2013-06-11 DIAGNOSIS — I509 Heart failure, unspecified: Secondary | ICD-10-CM | POA: Diagnosis present

## 2013-06-11 DIAGNOSIS — N179 Acute kidney failure, unspecified: Secondary | ICD-10-CM | POA: Diagnosis present

## 2013-06-11 DIAGNOSIS — F17200 Nicotine dependence, unspecified, uncomplicated: Secondary | ICD-10-CM | POA: Diagnosis present

## 2013-06-11 LAB — MAGNESIUM: Magnesium: 1.8 mg/dL (ref 1.5–2.5)

## 2013-06-11 LAB — PROTIME-INR
INR: 1.11 (ref 0.00–1.49)
Prothrombin Time: 14.1 seconds (ref 11.6–15.2)

## 2013-06-11 LAB — POCT I-STAT, CHEM 8
BUN: 31 mg/dL — ABNORMAL HIGH (ref 6–23)
Calcium, Ion: 1.11 mmol/L — ABNORMAL LOW (ref 1.12–1.23)
Creatinine, Ser: 1.2 mg/dL — ABNORMAL HIGH (ref 0.50–1.10)
Glucose, Bld: 99 mg/dL (ref 70–99)
HCT: 37 % (ref 36.0–46.0)
Hemoglobin: 12.6 g/dL (ref 12.0–15.0)
Potassium: 3.4 mEq/L — ABNORMAL LOW (ref 3.5–5.1)
Sodium: 141 mEq/L (ref 135–145)
TCO2: 28 mmol/L (ref 0–100)

## 2013-06-11 LAB — CBC
HCT: 34.1 % — ABNORMAL LOW (ref 36.0–46.0)
Hemoglobin: 10.8 g/dL — ABNORMAL LOW (ref 12.0–15.0)
MCH: 27.2 pg (ref 26.0–34.0)
RBC: 3.97 MIL/uL (ref 3.87–5.11)

## 2013-06-11 LAB — COMPREHENSIVE METABOLIC PANEL
ALT: 13 U/L (ref 0–35)
AST: 17 U/L (ref 0–37)
CO2: 29 mEq/L (ref 19–32)
Calcium: 8.8 mg/dL (ref 8.4–10.5)
Creatinine, Ser: 0.85 mg/dL (ref 0.50–1.10)
GFR calc non Af Amer: 77 mL/min — ABNORMAL LOW (ref >90)
Sodium: 141 mEq/L (ref 135–145)
Total Protein: 7.3 g/dL (ref 6.0–8.3)

## 2013-06-11 LAB — PRO B NATRIURETIC PEPTIDE: Pro B Natriuretic peptide (BNP): 135.4 pg/mL — ABNORMAL HIGH (ref 0–125)

## 2013-06-11 LAB — TROPONIN I: Troponin I: <0.3 ng/mL (ref <0.30)

## 2013-06-11 MED ORDER — SPIRONOLACTONE 100 MG PO TABS
100.0000 mg | ORAL_TABLET | Freq: Every day | ORAL | Status: DC
  Administered 2013-06-11 – 2013-06-13 (×3): 100 mg via ORAL
  Filled 2013-06-11 (×4): qty 1

## 2013-06-11 MED ORDER — HEPARIN SODIUM (PORCINE) 5000 UNIT/ML IJ SOLN
5000.0000 [IU] | Freq: Three times a day (TID) | INTRAMUSCULAR | Status: DC
  Administered 2013-06-11 – 2013-06-13 (×7): 5000 [IU] via SUBCUTANEOUS
  Filled 2013-06-11 (×10): qty 1

## 2013-06-11 MED ORDER — POTASSIUM CHLORIDE CRYS ER 20 MEQ PO TBCR
20.0000 meq | EXTENDED_RELEASE_TABLET | Freq: Once | ORAL | Status: AC
  Administered 2013-06-11: 20 meq via ORAL
  Filled 2013-06-11: qty 1

## 2013-06-11 MED ORDER — SODIUM CHLORIDE 0.9 % IJ SOLN: 3.0000 mL | INTRAMUSCULAR | Status: DC | PRN

## 2013-06-11 MED ORDER — ASPIRIN EC 81 MG PO TBEC
81.0000 mg | DELAYED_RELEASE_TABLET | Freq: Every day | ORAL | Status: DC
  Administered 2013-06-11 – 2013-06-13 (×3): 81 mg via ORAL
  Filled 2013-06-11 (×3): qty 1

## 2013-06-11 MED ORDER — SODIUM CHLORIDE 0.9 % IV SOLN: 250.0000 mL | INTRAVENOUS | Status: DC | PRN

## 2013-06-11 MED ORDER — CLINDAMYCIN PHOSPHATE 600 MG/50ML IV SOLN
600.0000 mg | Freq: Three times a day (TID) | INTRAVENOUS | Status: DC
  Filled 2013-06-11 (×3): qty 50

## 2013-06-11 MED ORDER — DAPTOMYCIN 500 MG IV SOLR
400.0000 mg | INTRAVENOUS | Status: DC
  Administered 2013-06-11 – 2013-06-12 (×2): 400 mg via INTRAVENOUS
  Filled 2013-06-11 (×4): qty 8

## 2013-06-11 MED ORDER — LISINOPRIL 40 MG PO TABS
40.0000 mg | ORAL_TABLET | Freq: Every day | ORAL | Status: DC
  Administered 2013-06-11 – 2013-06-13 (×3): 40 mg via ORAL
  Filled 2013-06-11 (×4): qty 1

## 2013-06-11 MED ORDER — HYDRALAZINE HCL 20 MG/ML IJ SOLN
5.0000 mg | INTRAMUSCULAR | Status: DC | PRN
  Administered 2013-06-12 – 2013-06-13 (×3): 5 mg via INTRAVENOUS
  Filled 2013-06-11 (×3): qty 1

## 2013-06-11 MED ORDER — COLLAGENASE 250 UNIT/GM EX OINT
TOPICAL_OINTMENT | Freq: Every day | CUTANEOUS | Status: DC
  Administered 2013-06-11: 15:00:00 via TOPICAL
  Administered 2013-06-12: 1 via TOPICAL
  Administered 2013-06-13: 11:00:00 via TOPICAL
  Filled 2013-06-11: qty 30

## 2013-06-11 MED ORDER — VANCOMYCIN HCL IN DEXTROSE 1-5 GM/200ML-% IV SOLN
1000.0000 mg | Freq: Once | INTRAVENOUS | Status: AC
  Administered 2013-06-11: 1000 mg via INTRAVENOUS
  Filled 2013-06-11 (×2): qty 200

## 2013-06-11 MED ORDER — ONDANSETRON HCL 4 MG/2ML IJ SOLN
4.0000 mg | Freq: Once | INTRAMUSCULAR | Status: AC
  Administered 2013-06-11: 4 mg via INTRAVENOUS
  Filled 2013-06-11: qty 2

## 2013-06-11 MED ORDER — AMLODIPINE BESYLATE 10 MG PO TABS
10.0000 mg | ORAL_TABLET | Freq: Every day | ORAL | Status: DC
  Administered 2013-06-11: 11:00:00 10 mg via ORAL
  Filled 2013-06-11: qty 1

## 2013-06-11 MED ORDER — SODIUM CHLORIDE 0.9 % IV SOLN
INTRAVENOUS | Status: DC
  Administered 2013-06-11: 02:00:00 via INTRAVENOUS

## 2013-06-11 MED ORDER — SODIUM CHLORIDE 0.9 % IJ SOLN
3.0000 mL | Freq: Two times a day (BID) | INTRAMUSCULAR | Status: DC
  Administered 2013-06-11 – 2013-06-12 (×2): 3 mL via INTRAVENOUS

## 2013-06-11 MED ORDER — MORPHINE SULFATE 2 MG/ML IJ SOLN
2.0000 mg | INTRAMUSCULAR | Status: DC | PRN
  Administered 2013-06-11 – 2013-06-12 (×5): 2 mg via INTRAVENOUS
  Filled 2013-06-11 (×5): qty 1

## 2013-06-11 MED ORDER — INSULIN ASPART 100 UNIT/ML ~~LOC~~ SOLN: 10.0000 [IU] | Freq: Once | SUBCUTANEOUS | Status: DC

## 2013-06-11 MED ORDER — HYDROCODONE-ACETAMINOPHEN 5-325 MG PO TABS
1.0000 | ORAL_TABLET | Freq: Three times a day (TID) | ORAL | Status: DC
  Administered 2013-06-11 – 2013-06-13 (×8): 1 via ORAL
  Filled 2013-06-11 (×14): qty 1

## 2013-06-11 MED ORDER — FENTANYL CITRATE 0.05 MG/ML IJ SOLN
50.0000 ug | INTRAMUSCULAR | Status: DC | PRN
  Administered 2013-06-11 (×2): 50 ug via INTRAVENOUS
  Filled 2013-06-11 (×2): qty 2

## 2013-06-11 MED ORDER — SODIUM CHLORIDE 0.9 % IJ SOLN
3.0000 mL | Freq: Two times a day (BID) | INTRAMUSCULAR | Status: DC
  Administered 2013-06-11: 22:00:00 3 mL via INTRAVENOUS

## 2013-06-11 MED ORDER — DEXTROSE 50 % IV SOLN: 50.0000 mL | Freq: Once | INTRAVENOUS | Status: DC

## 2013-06-11 NOTE — Progress Notes (Signed)
ANTIBIOTIC CONSULT NOTE - INITIAL  Pharmacy Consult for daptomycin Indication: cellulitis  No Known Allergies  Patient Measurements: Height: 5\' 8"  (172.7 cm) Weight: 226 lb 4.8 oz (102.649 kg) IBW/kg (Calculated) : 63.9 Adjusted Body Weight:   Vital Signs: Temp: 97.9 F (36.6 C) (10/22 0531) Temp src: Oral (10/22 0531) BP: 174/100 mmHg (10/22 0531) Pulse Rate: 71 (10/22 0531) Intake/Output from previous day:   Intake/Output from this shift: Total I/O In: 3 [I.V.:3] Out: -   Labs:  Recent Labs  06/11/13 0135 06/11/13 0143  WBC 6.2  --   HGB 10.8* 12.6  PLT 390  --   CREATININE  --  1.20*   Estimated Creatinine Clearance: 68.7 ml/min (by C-G formula based on Cr of 1.2). No results found for this basename: VANCOTROUGH, VANCOPEAK, VANCORANDOM, GENTTROUGH, GENTPEAK, GENTRANDOM, TOBRATROUGH, TOBRAPEAK, TOBRARND, AMIKACINPEAK, AMIKACINTROU, AMIKACIN,  in the last 72 hours   Microbiology: No results found for this or any previous visit (from the past 720 hour(s)).  Medical History: Past Medical History  Diagnosis Date  . Hypertension   . Venous stasis ulcers   . Diastolic heart failure   . Hypokalemia   . Dyslipidemia     pt denies this hx on 05/12/2013  . Anemia   . CHF (congestive heart failure)   . Chronic bronchitis     "probably once/yr" (05/12/2013)  . Exertional shortness of breath   . GERD (gastroesophageal reflux disease)     Medications:  Anti-infectives   Start     Dose/Rate Route Frequency Ordered Stop   06/11/13 1200  DAPTOmycin (CUBICIN) 400 mg in sodium chloride 0.9 % IVPB     400 mg 216 mL/hr over 30 Minutes Intravenous Every 24 hours 06/11/13 1051     06/11/13 0930  clindamycin (CLEOCIN) IVPB 600 mg  Status:  Discontinued     600 mg 100 mL/hr over 30 Minutes Intravenous 3 times per day 06/11/13 0835 06/11/13 1030   06/11/13 0115  vancomycin (VANCOCIN) IVPB 1000 mg/200 mL premix     1,000 mg 200 mL/hr over 60 Minutes Intravenous  Once  06/11/13 0107 06/11/13 0320     Assessment: 52 yof presented to the hospital with LLE swelling and pain. Initially given a 1x dose of vancomycin. However, now starting daptomycin. Pt is afebrile and WBC is WNL. Scr mildly elevated at 1.2 but CrCl remains good. Cultures pending  10/22 Vanc x 1 Dapto 10/22>>  Goal of Therapy:  Eradication of infection  Plan:  1. Daptomycin 400mg  IV Q24H (4mg /kg/day) 2. F/u renal fxn, C&S, clinical status and weekly CK starting tomorrow 3. De-escalate as soon as possible  Sakai Wolford, Drake Leach 06/11/2013,10:52 AM

## 2013-06-11 NOTE — Progress Notes (Signed)
Paged MD Rabenea to get admission orders for pt. Dr. Clyde Lundborg is to put in admission orders on pt this am.

## 2013-06-11 NOTE — ED Provider Notes (Signed)
CSN: [id number]     Arrival date & time 06/11/13  0034 History   First MD Initiated Contact with Patient 06/11/13 0048     Chief Complaint  Patient presents with  . Wound Infection   (Consider location/radiation/quality/duration/timing/severity/associated sxs/prior Treatment) HPI HX per PT - has chronic LE ulcers RLE, was previously followed by wound clinic and that leg is doing much better.  LLE has also now developed ulcers, she has not been able to get into the wound clinic. Over the last 5 days LLE has become painful, red, swollen gradually worsening. No F/C, no N/V. Called EMS tonight for symptoms. Was prescribed new medications last week but has not picked up her Rx yet.  Past Medical History  Diagnosis Date  . Hypertension   . Venous stasis ulcers   . Diastolic heart failure   . Hypokalemia   . Dyslipidemia     pt denies this hx on 05/12/2013  . Anemia   . CHF (congestive heart failure)   . Chronic bronchitis     "probably once/yr" (05/12/2013)  . Exertional shortness of breath   . GERD (gastroesophageal reflux disease)    Past Surgical History  Procedure Laterality Date  . Vaginal hysterectomy  1990's   No family history on file. History  Substance Use Topics  . Smoking status: Current Every Day Smoker -- 0.50 packs/day for 36 years    Types: Cigarettes  . Smokeless tobacco: Never Used  . Alcohol Use: 4.2 oz/week    7 Cans of beer per week     Comment: Beer once a week.   OB History   Grav Para Term Preterm Abortions TAB SAB Ect Mult Living                 Review of Systems  Constitutional: Negative for fever and chills.  Eyes: Negative for pain.  Respiratory: Negative for shortness of breath.   Cardiovascular: Positive for leg swelling. Negative for chest pain.  Gastrointestinal: Negative for abdominal pain.  Genitourinary: Negative for dysuria.  Musculoskeletal: Negative for back pain, neck pain and neck stiffness.  Skin: Positive for rash and wound.   Neurological: Negative for headaches.  All other systems reviewed and are negative.    Allergies  Review of patient's allergies indicates no known allergies.  Home Medications   Current Outpatient Rx  Name  Route  Sig  Dispense  Refill  . lisinopril (PRINIVIL,ZESTRIL) 20 MG tablet   Oral   Take 2 tablets (40 mg total) by mouth daily.   60 tablet   11   . mupirocin ointment (BACTROBAN) 2 %   Topical   Apply 1 application topically daily.         Marland Kitchen spironolactone (ALDACTONE) 100 MG tablet   Oral   Take 1 tablet (100 mg total) by mouth daily.   30 tablet   11   . Wound Dressings (VASELINE PETROLATUM GAUZE) PADS   Apply externally   Apply 1 each topically daily.   50 each   0    BP 160/112  Pulse 87  Temp(Src) 97.4 F (36.3 C) (Oral)  Resp 20  SpO2 100% Physical Exam  Nursing note and vitals reviewed. Constitutional: She is oriented to person, place, and time. She appears well-developed and well-nourished.  HENT:  Head: Normocephalic and atraumatic.  Eyes: EOM are normal. Pupils are equal, round, and reactive to light.  Neck: Neck supple.  Cardiovascular: Normal rate, normal heart sounds and intact distal pulses.  Pulmonary/Chest: Effort normal and breath sounds normal. No respiratory distress.  Abdominal: Soft. Bowel sounds are normal. She exhibits no distension. There is no tenderness.  Musculoskeletal: Normal range of motion.  LLE with multiple ulcers and distal erythema, tenderness, swelling and inc warmth to touch. Distal N/V intact.   Neurological: She is alert and oriented to person, place, and time.  Skin: Skin is warm and dry.    ED Course  Procedures (including critical care time) Labs Review Labs Reviewed  CBC - Abnormal; Notable for the following:    Hemoglobin 10.8 (*)    HCT 34.1 (*)    All other components within normal limits  POCT I-STAT, CHEM 8 - Abnormal; Notable for the following:    Potassium 3.4 (*)    BUN 31 (*)     Creatinine, Ser 1.20 (*)    Calcium, Ion 1.11 (*)    All other components within normal limits   Imaging Review No results found.   Date: 06/11/2013  Rate: 92  Rhythm: normal sinus rhythm  QRS Axis: left  Intervals: PR prolonged  ST/T Wave abnormalities: nonspecific ST changes  Conduction Disutrbances:first-degree A-V block   Narrative Interpretation: Sinus with first degree AV block and borderline prolonged QT  Old EKG Reviewed: unchanged  IV vancomycin. IV narcotics pain control  Discussed with outpatient clinics resident on call, plan admit for IV antibiotics  MDM  Diagnosis: Cellulitis left lower extremity  Medications provided, labs reviewed MED admit    Sunnie Nielsen, MD 06/11/13 [phone]

## 2013-06-11 NOTE — ED Notes (Signed)
Bed: WA06 Expected date:  Expected time:  Means of arrival:  Comments: EMS/52 yo leg pain with wounds-hypertensive-12 lead 1st degree block

## 2013-06-11 NOTE — ED Notes (Signed)
Pt states she was seen 6 months ago for a wound on her opposite leg. Has had wounds on her L leg x 1 month that have been getting worse. Internal medicine doc got pt appt for wound clinic on 31st and gave her lasix pills which she hasn't picked up yet. Hypertensive. Possible first degree heart block per EMS EKG. Ambulatory alert and oriented.

## 2013-06-11 NOTE — H&P (Signed)
Hospital Admission Note Date: 06/11/2013  Patient name: [name] Medical record number: [id number] Date of birth: 1960/11/03 Age: 52 y.o. Gender: female PCP: Boykin Peek, MD  Medical Service: IMTS  Attending physician: Dr. [name]  Internal Medicine Teaching Service Contact Information  Weekday Hours (7AM-5PM):  1st Contact: MS4 Kerrie Pleasure     Pager   [phone] 2nd Contact: Dr. Bosie Clos                 Pager:[phone]  ** If no return call within 15 minutes (after trying both pagers listed above), please call after hours pagers.  After 5 pm or weekends: 1st Contact: Pager: [phone] 2nd Contact: Pager: [phone]  Chief Complaint: left leg swelling and pain, SOB  History of Present Illness:  Patient is a 52 year old lady with past medical history of hypertension, chronic venous insufficiency in legs, diastolic congestive heart failure, GERD, who presents with left leg swelling, leg pain and SOB.   Patient has a history of chronic venous insufficiency with leg ulcers. She was previously treated at wound care center. She has not been able to get into the wound clinic recently. She was seen in clinic on 10/14 because of worsening leg edema and skin ulcers on the left leg. Her blood pressure was also elevated. Her medications were adjusted by her PCP. Her lisinopril dosage was increased from 20 to 40 mg daily. She was given prescription of spironolactone, but she has not started taking this medication yet. Patient reports that her leg edema and leg pain have been progressively getting worse in the past 2 weeks. She noticed some yellow-colored drainage from her left leg ulcers. She does not have fever or chills. She also has mild shortness of breath, which started gradually in past 3 days. She has a mild dry cough. No chest pain or wheezing. No recent long distance traveling history. She called EMS last night and was brought to [facility] ED. She was transferred to [facility] hospital this AM.  Patient received 1 dose of IV vancomycin in [facility] ED.   ROS:  Denies fever, chills, headaches, chest pain, abdominal pain, diarrhea, constipation, dysuria, urgency, frequency, hematuria.   Meds:  Medications Prior to Admission  Medication Sig Dispense Refill  . lisinopril (PRINIVIL,ZESTRIL) 20 MG tablet Take 2 tablets (40 mg total) by mouth daily.  60 tablet  11  . spironolactone (ALDACTONE) 100 MG tablet Take 1 tablet (100 mg total) by mouth daily.  30 tablet  11    Allergies: Allergies as of 06/11/2013  . (No Known Allergies)   Past Medical History  Diagnosis Date  . Hypertension   . Venous stasis ulcers   . Diastolic heart failure   . Hypokalemia   . Dyslipidemia     pt denies this hx on 05/12/2013  . Anemia   . CHF (congestive heart failure)   . Chronic bronchitis     "probably once/yr" (05/12/2013)  . Exertional shortness of breath   . GERD (gastroesophageal reflux disease)    Past Surgical History  Procedure Laterality Date  . Vaginal hysterectomy  1990's   History reviewed. No pertinent family history. History   Social History  . Marital Status: Single    Spouse Name: N/A    Number of Children: N/A  . Years of Education: N/A   Occupational History  . Not on file.   Social History Main Topics  . Smoking status: Current Every Day Smoker -- 0.50 packs/day for 36 years    Types: Cigarettes  .  Smokeless tobacco: Never Used  . Alcohol Use: 4.2 oz/week    7 Cans of beer per week     Comment: Beer once a week.  . Drug Use: No     Comment: 05/12/2013 "tried it a few days ago; didn't like it"  . Sexual Activity: Not on file   Other Topics Concern  . Not on file   Social History Narrative  . No narrative on file    Review of Systems: Full 14-point review of systems otherwise negative except as noted above in HPI.  Physical Exam:   Filed Vitals:   06/11/13 0311 06/11/13 0400 06/11/13 0430 06/11/13 0531  BP: 158/98 157/104 149/88 174/100  Pulse: 70 78 77 71   Temp: 97.7 F (36.5 C)   97.9 F (36.6 C)  TempSrc: Oral   Oral  Resp: 13 15 11 16   Height:    5\' 8"  (1.727 m)  Weight:    226 lb 4.8 oz (102.649 kg)  SpO2: 98% 97% 97% 97%   General: Not in acute distress.  HEENT: PERRL, EOMI, no scleral icterus, No JVD or bruit Cardiac: S1/S2, RRR, Grade II/VI systolic murmur, No gallops, or rubs Pulm: decreased air movement on the right lower field posteriorly. No rales, wheezing, rhonchi or rubs. Abd: Soft, nondistended, nontender, no rebound pain, no organomegaly, BS present Ext: Bilateral lower leg pitting edema, 1+ on the right leg, 2+ on the left leg. Left lower leg has multiple ulcers with erythema, tenderness and warmth. Distal N/V intact.   Musculoskeletal: Normal range of motion. Neuro: Alert and oriented X3, cranial nerves II-XII grossly intact, muscle strength 5/5 in all extremeties, sensation to light touch intact.  Psych: Patient is not psychotic, no suicidal or hemocidal ideation.  Lab results: Basic Metabolic Panel:  Recent Labs  16/10/96 0143  NA 141  K 3.4*  CL 101  GLUCOSE 99  BUN 31*  CREATININE 1.20*   Liver Function Tests: No results found for this basename: AST, ALT, ALKPHOS, BILITOT, PROT, ALBUMIN,  in the last 72 hours No results found for this basename: LIPASE, AMYLASE,  in the last 72 hours No results found for this basename: AMMONIA,  in the last 72 hours CBC:  Recent Labs  06/11/13 0135 06/11/13 0143  WBC 6.2  --   HGB 10.8* 12.6  HCT 34.1* 37.0  MCV 85.9  --   PLT 390  --    Cardiac Enzymes: No results found for this basename: CKTOTAL, CKMB, CKMBINDEX, TROPONINI,  in the last 72 hours BNP: No results found for this basename: PROBNP,  in the last 72 hours D-Dimer: No results found for this basename: DDIMER,  in the last 72 hours CBG: No results found for this basename: GLUCAP,  in the last 72 hours Hemoglobin A1C: No results found for this basename: HGBA1C,  in the last 72 hours Fasting Lipid  Panel: No results found for this basename: CHOL, HDL, LDLCALC, TRIG, CHOLHDL, LDLDIRECT,  in the last 72 hours Thyroid Function Tests: No results found for this basename: TSH, T4TOTAL, FREET4, T3FREE, THYROIDAB,  in the last 72 hours Anemia Panel: No results found for this basename: VITAMINB12, FOLATE, FERRITIN, TIBC, IRON, RETICCTPCT,  in the last 72 hours Coagulation: No results found for this basename: LABPROT, INR,  in the last 72 hours Urine Drug Screen: Drugs of Abuse     Component Value Date/Time   LABOPIA POSITIVE* 12/30/2010 0436   COCAINSCRNUR NONE DETECTED 12/30/2010 0436   LABBENZ NONE DETECTED  12/30/2010 0436   AMPHETMU NONE DETECTED 12/30/2010 0436   THCU NONE DETECTED 12/30/2010 0436   LABBARB  Value: NONE DETECTED        DRUG SCREEN FOR MEDICAL PURPOSES ONLY.  IF CONFIRMATION IS NEEDED FOR ANY PURPOSE, NOTIFY LAB WITHIN 5 DAYS.        LOWEST DETECTABLE LIMITS FOR URINE DRUG SCREEN Drug Class       Cutoff (ng/mL) Amphetamine      1000 Barbiturate      200 Benzodiazepine   200 Tricyclics       300 Opiates          300 Cocaine          300 THC              50 12/30/2010 0436    Alcohol Level: No results found for this basename: ETH,  in the last 72 hours Urinalysis: No results found for this basename: COLORURINE, APPERANCEUR, LABSPEC, PHURINE, GLUCOSEU, HGBUR, BILIRUBINUR, KETONESUR, PROTEINUR, UROBILINOGEN, NITRITE, LEUKOCYTESUR,  in the last 72 hours Misc. Labs:   Imaging results:  No results found.  Other results:  EKG: Sinus rhythm, regular, borderline LAD, LVH, first degree AV block, QTc 492, No ischemic change in T waves or ST segments.  Assessment & Plan by Problem: Principal Problem:   Cellulitis Active Problems:   Hypertension   Venous stasis ulcer   CHF (congestive heart failure)   GERD (gastroesophageal reflux disease)   AKI  Patient is a 52 year old lady with past medical history of hypertension, chronic venous insufficiency in legs, diastolic congestive  heart failure, GERD, who presents with left leg swelling, leg pain and SOB. No leukocytosis, creatinine 1.2, potassium 3.4.  #: left leg cellulitis:  Patient presents with erythema, redness, and swelling of the left lower extremity. She also reports having yellow colored drainage (I could not appreciate it).  This is most consistent with clinical cellulitis but Lipodermatosclerosis secondary to chronic venous insufficiency is also a possibility. Patient has asymmetric lower leg edema, it is important to rule out DVT. She has no fever or leukocytosis. She is hemodynamically stable, no signs of sepsis currently. she dose not have DM. Patient received 1 dose of IV vancomycin in [facility] ED.   Plan: - Admit to tele bed   - IV clindamycin 600 mg q. 8 hour. May switch to oral doxycycline, or bactrim if her Cre comes back to baseline - pain control with Norco and morphine prn - Consult wound care - Get Venous doppler to rule out DVT  - Heparin for DVT prophylaxis  - blood culture x 2 (patient received 1 dose of vancomycin in [facility] ED).  #: SOB: Patient has a shortness of breath in the past 3 days, her shortness of breath started gradually. She has mild dry cough without any chest pain. It is likely related to her diastolic congestive heart failure. Her 2-D echo on 09/28/07 showed EF 60%. She has LVH and borderline left axis deviation on EKG indicating possible diastolic dysfunction. Her body weight increased slightly from 223 on 9/22 to 226 pounds today. She has not been on diuretics. Patient does not have JVD. Her bilateral leg edema is likely due to chronic venous insufficiency. Her BUN/creatinine ratio is more than 20, indicating possible intravascular volume depletion. Will not start Lasix before getting CXR and proBNP level. Another potential diagnosis is pulmonary embolism which is less likely. she does not have any chest pain, no tachycardia. Her oxygen saturation is 97% on  room air. Her Well's score is low  probability. Will not pursue CTA at this moment.  -will get CXR and ProBNP -will hold diuretics for now given possible volume contraction. If proBNP is significant elevated and chest x-ray shows pulmonary edema, will start Lasix.  -start ASA 81 mg daily -control Bp as below -Repeat 2D ehco -trop X 1  #: HTN: Blood pressure is elevated at 174/100. Patient was on lisinopril 40 mg daily at home. She was given prescription for spironolactone, but patient has not started this medication yet. PCP is concerned that the patient may have primary hyperaldosteronism due to persistent hypokalemia.   -will switch lisinopril to amlodipine 10 mg daily given elevated Cre level. -will follow up BMP, if Cre comes back to Bl, may switch back to lisinopril and start diuretics.  #: AKI: Her creatinine was 1.01 on 9/22. Creatinine is slightly elevated at 1.2 today. Her BUN/creatinine ratio is more than 20, indicating possible prerenal failure.  -Will hold diuretics -Will switch lisinopril to amlodipine. -f/u bmp  #: GERD: stable. Not on medications at home. Will observe closely.  #  F/E/N  -SL:  -Hypokalemia: K=3.4, Kdur 20 meQ x1. f/u  BMP -Diet: Heart health diet.  # DVT px: Heparin sq   Dispo: Disposition is deferred at this time, awaiting improvement of current medical problems. Anticipated discharge in approximately 2-3 day(s).   The patient does have a current PCP Delane Ginger, [place], MD), therefore will be requiring OPC follow-up after discharge.   The patient does not have transportation limitations that hinder transportation to clinic appointments.  Signed:  Lorretta Harp, MD PGY3, Internal Medicine Teaching Service Pager: [phone]  06/11/2013, 8:17 AM

## 2013-06-11 NOTE — Progress Notes (Signed)
Pt [facility]. Pt in no apparent distress at this time. Pt was oriented to room, placed in bed, vital signs were taken, and safety video has been viewed. Admission Md has been called for admissionn orders. RN will continue to monitor pt and await further orders.

## 2013-06-11 NOTE — H&P (Signed)
  Date: 06/11/2013  Patient name: [name]  Medical record number: [id number]  Date of birth: 1960/09/03   I have seen and evaluated [name] and discussed their care with the Residency Team.   Assessment and Plan: I have seen and evaluated the patient as outlined above. I agree with the formulated Assessment and Plan as detailed in the residents' admission note, with the following changes:  Please see my note as well.   Ginnie Smart, MD 10/22/201412:52 PM

## 2013-06-11 NOTE — Progress Notes (Signed)
  Echocardiogram 2D Echocardiogram has been performed.  Jalasia Eskridge, [facility] 06/11/2013, 11:23 AM

## 2013-06-11 NOTE — Progress Notes (Signed)
VASCULAR LAB PRELIMINARY  PRELIMINARY  PRELIMINARY  PRELIMINARY  Bilateral lower extremity venous duplex  completed.    Preliminary report:  Bilateral:  No evidence of DVT, superficial thrombosis, or Baker's Cyst.    Gregorio Worley, RVT 06/11/2013, 1:31 PM

## 2013-06-11 NOTE — Care Management Note (Unsigned)
    Page 1 of 1   06/12/2013     12:33:28 PM   CARE MANAGEMENT NOTE 06/12/2013  Patient:  SHLOKA, BALDRIDGE   Account Number:  [id number]  Date Initiated:  06/11/2013  Documentation initiated by:  Letha Cape  Subjective/Objective Assessment:   dx cellulitis  admit- lives alone.     Action/Plan:   Anticipated DC Date:  06/13/2013   Anticipated DC Plan:  HOME/SELF CARE      DC Planning Services  CM consult      Choice offered to / List presented to:             Status of service:  In process, will continue to follow Medicare Important Message given?   (If response is "NO", the following Medicare IM given date fields will be blank) Date Medicare IM given:   Date Additional Medicare IM given:    Discharge Disposition:    Per UR Regulation:  Reviewed for med. necessity/level of care/duration of stay  If discussed at Long Length of Stay Meetings, dates discussed:    Comments:  06/12/13 12:32 Letha Cape RN, BSN [phone] patient lives alone.  pta indep.  Patient has follow up appt with wound clinic next Thurs per MD note.  NCM will continue to follow for dc needs.

## 2013-06-11 NOTE — Consult Note (Addendum)
WOC wound consult note Reason for Consult: Consult requested for left leg wounds.  Pt followed by outpatient wound care center and uses Santyl and Una boots.  She states left leg began having increased pain and swelling this week. She states these wounds have been present several months. Recent study negative for DVT. Wound type: Left lower posterior calf with several full thickness stasis ulcers.   Measurement: 1X1X.1cm, 1X.5X.1cm, 2X2X.1cm. Wound bed: 60% red, 40% yellow Drainage (amount, consistency, odor) No odor or drainage at this time.  Pt states wounds did not have increased drainage when they began to be painful, but have remained dry. Periwound: Generalized darkened pigment of skin, pt states this is the regular appearance of her skin. Remains with generalized edema to entire LLE. Dressing procedure/placement/frequency: Continue present plan of care with Santyl for chemical debridement.  Will not apply Una boot at this time R/T significant pain in left leg. Protect wound bed with foam dressing. Discussed plan of care with primary team; If pain persists after antibiotic regime has been established, then pt could benefit from MRI to R/O abscess.  She can resume follow-up with outpatient wound care center after discharge. Please re-consult if further assistance is needed.  Thank-you,  Cammie Mcgee MSN, RN, CWOCN, [place], CNS [phone]

## 2013-06-11 NOTE — H&P (Signed)
  Date: 06/11/2013  Patient name: [name]  Medical record number: [id number]  Date of birth: [birth year]   I have seen and evaluated [name] and discussed their care with the Residency Team.   Assessment and Plan: I have seen and evaluated the patient as outlined above. I agree with the formulated Assessment and Plan as detailed in the residents' admission note, with the following changes:   52 yo F with hx of diastolic heart failure, previous R LE ulcers for which she was seen at [facility]. She comes to ED today with new ulcers on her LLE for the last 2 weeks. She has been seen by her PCP and her her heart failure rx increased lately (ACE-I). Her ulcers have been painful and have been weeping. She denies f/c at home. She has had SOB. In ED she was, afebrile, started on vanco. Since changed to clinda.   Filed Vitals:   06/11/13 0531  BP: 174/100  Pulse: 71  Temp: 97.9 F (36.6 C)  Resp: 16   Eyes- EOMI, PERRL Mouth- no thrush, no ulcers Neck- non-tender, no LAN Chest- mild-mod wheezes. CV- RRR Abd- BS+, soft, non-tender.  Extr- non-pitting B LE edema. Her LLE has 2 ulcers, superficial. There is no d/c. They are mildly tender. There is increased heat in both LE. There is no palpable cordis in posterior LE. She has onychmycosis.    Labs of note: WBC 6.2     Cr 1.20     K+ 3.4 CXR: pending BCx pending  A/p Cellulitis Venous insufficiency ulcers Diastolic CHF  Change clinda to dapto Check her CK Check LE doppler F/u CXR F/u BCx WOC eval F/u Troponin Watch for worsening leg swelling while on amlodipine  Check HIV  Ginnie Smart, MD [id number] AM

## 2013-06-12 DIAGNOSIS — E785 Hyperlipidemia, unspecified: Secondary | ICD-10-CM

## 2013-06-12 LAB — CBC
Hemoglobin: 10.3 g/dL — ABNORMAL LOW (ref 12.0–15.0)
MCHC: 31.2 g/dL (ref 30.0–36.0)
RDW: 13.2 % (ref 11.5–15.5)
WBC: 5.3 10*3/uL (ref 4.0–10.5)

## 2013-06-12 LAB — BASIC METABOLIC PANEL
BUN: 15 mg/dL (ref 6–23)
Calcium: 8.7 mg/dL (ref 8.4–10.5)
Chloride: 99 mEq/L (ref 96–112)
Creatinine, Ser: 0.8 mg/dL (ref 0.50–1.10)
GFR calc Af Amer: >90 mL/min (ref >90)
GFR calc non Af Amer: 83 mL/min — ABNORMAL LOW (ref >90)
Potassium: 3 mEq/L — ABNORMAL LOW (ref 3.5–5.1)

## 2013-06-12 LAB — CK: Total CK: 106 U/L (ref 7–177)

## 2013-06-12 MED ORDER — HYDRALAZINE HCL 25 MG PO TABS
25.0000 mg | ORAL_TABLET | Freq: Four times a day (QID) | ORAL | Status: DC
  Administered 2013-06-12 – 2013-06-13 (×4): 25 mg via ORAL
  Filled 2013-06-12 (×7): qty 1

## 2013-06-12 MED ORDER — POTASSIUM CHLORIDE CRYS ER 20 MEQ PO TBCR
40.0000 meq | EXTENDED_RELEASE_TABLET | ORAL | Status: AC
  Administered 2013-06-12 (×2): 40 meq via ORAL
  Filled 2013-06-12 (×2): qty 2

## 2013-06-12 MED ORDER — PIPERACILLIN-TAZOBACTAM 3.375 G IVPB
3.3750 g | Freq: Three times a day (TID) | INTRAVENOUS | Status: DC
  Administered 2013-06-12 – 2013-06-13 (×4): 3.375 g via INTRAVENOUS
  Filled 2013-06-12 (×6): qty 50

## 2013-06-12 NOTE — Progress Notes (Signed)
I agree with above.  Christoper Fabian, PharmD, BCPS Clinical pharmacist, pager [phone] 06/12/2013  2:17 PM

## 2013-06-12 NOTE — Progress Notes (Addendum)
  Date: 06/12/2013  Patient name: [name]  Medical record number: [id number]  Date of birth: [birth year]   This patient has been seen and the plan of care was discussed with the house staff. Please see their note for complete details. I concur with their findings with the following additions/corrections:  Patient is seen with house officers as well as medical student. She asks when she can go home On exam her left lower extremity shows areas of maceration as well as a foul odor. At this point we will expand her antibiotics. We will ask wound care to reevaluate her. It is possible she could go home on oral antibiotics at this point, however she will need very reliable wound care at follow-up. my concern is that her area on her left lower extremity we'll further denude, she will be left with a very large wound, and she will need more aggressive therapy (surgery). She needs improved management of her hypertension   Ginnie Smart, MD 06/12/2013, 9:20 AM

## 2013-06-12 NOTE — Progress Notes (Signed)
Subjective: Ms. Colasanti's pain in her LLE is improved this morning. She denies any other complaints. She states she can keep her legs elevated. She has an outpatient appointment at the wound clinic next Thursday, Oct 30th at 9 am.   I spoke to her after rounds, explaining that we are adding another antibiotic and she will have to remain inpatient given the risk of worsening of her ulcers. She is agreeable and also agrees to trying the [place] boot today.   Objective: Vital signs in last 24 hours: Filed Vitals:   06/11/13 1717 06/11/13 2132 06/12/13 0521 06/12/13 0733  BP: 172/90 179/94 200/110 172/98  Pulse:  68 75 75  Temp:  98.2 F (36.8 C) 98.5 F (36.9 C)   TempSrc:  Oral Oral   Resp:  20 18   Height:      Weight:   102.331 kg (225 lb 9.6 oz)   SpO2:  98% 99%    Weight change: -0.318 kg (-11.2 oz)  Intake/Output Summary (Last 24 hours) at 06/12/13 0857 Last data filed at 06/11/13 1800  Gross per 24 hour  Intake    731 ml  Output      3 ml  Net    728 ml    Physical Exam  Constitutional: She appears well-developed and well-nourished.  Cardiovascular: Normal rate, regular rhythm and normal heart sounds.   Pulmonary/Chest: Effort normal and breath sounds normal. No respiratory distress.  Ext: 1+ BLE edema. There are 2 superficial ulcers on the lateral aspect of her LLE, both superficial and above her ankle. Dressing in place. There is significant skin erosion surrounding the ulcers with risk of worsening. No active drainage. Tenderness is improved today.   Lab Results: Basic Metabolic Panel:  Recent Labs Lab 06/11/13 1050 06/12/13 0620  NA 141 138  K 3.2* 3.0*  CL 100 99  CO2 29 28  GLUCOSE 125* 88  BUN 21 15  CREATININE 0.85 0.80  CALCIUM 8.8 8.7  MG 1.8  --    Liver Function Tests:  Recent Labs Lab 06/11/13 1050  AST 17  ALT 13  ALKPHOS 60  BILITOT 0.5  PROT 7.3  ALBUMIN 3.6   CBC:  Recent Labs Lab 06/11/13 0135 06/11/13 0143 06/12/13 0620    WBC 6.2  --  5.3  HGB 10.8* 12.6 10.3*  HCT 34.1* 37.0 33.0*  MCV 85.9  --  88.0  PLT 390  --  344   Cardiac Enzymes:  Recent Labs Lab 06/11/13 1930 06/12/13 0620  CKTOTAL  --  106  TROPONINI <0.30  --    BNP:  Recent Labs Lab 06/11/13 1050  PROBNP 135.4*   Coagulation:  Recent Labs Lab 06/11/13 1050  LABPROT 14.1  INR 1.11   Drugs of Abuse     Component Value Date/Time   LABOPIA POSITIVE* 12/30/2010 0436   COCAINSCRNUR NONE DETECTED 12/30/2010 0436   LABBENZ NONE DETECTED 12/30/2010 0436   AMPHETMU NONE DETECTED 12/30/2010 0436   THCU NONE DETECTED 12/30/2010 0436   LABBARB  Value: NONE DETECTED        DRUG SCREEN FOR MEDICAL PURPOSES ONLY.  IF CONFIRMATION IS NEEDED FOR ANY PURPOSE, NOTIFY LAB WITHIN 5 DAYS.        LOWEST DETECTABLE LIMITS FOR URINE DRUG SCREEN Drug Class       Cutoff (ng/mL) Amphetamine      1000 Barbiturate      200 Benzodiazepine   200 Tricyclics       300  Opiates          300 Cocaine          300 THC              50 12/30/2010 0436     Micro Results: Recent Results (from the past 240 hour(s))  CULTURE, BLOOD (ROUTINE X 2)     Status: None   Collection Time    06/11/13  9:18 AM      Result Value Range Status   Specimen Description BLOOD LEFT ANTECUBITAL   Final   Special Requests BOTTLES DRAWN AEROBIC ONLY 10CC   Final   Culture  Setup Time     Final   Value: 06/11/2013 15:10     Performed at [facility]   Culture     Final   Value:        BLOOD CULTURE RECEIVED NO GROWTH TO DATE CULTURE WILL BE HELD FOR 5 DAYS BEFORE ISSUING A FINAL NEGATIVE REPORT     Performed at [facility]   Report Status PENDING   Incomplete  CULTURE, BLOOD (ROUTINE X 2)     Status: None   Collection Time    06/11/13  9:22 AM      Result Value Range Status   Specimen Description BLOOD HAND RIGHT   Final   Special Requests BOTTLES DRAWN AEROBIC AND ANAEROBIC 10CC   Final   Culture  Setup Time     Final   Value: 06/11/2013 15:10     Performed at  [facility]   Culture     Final   Value:        BLOOD CULTURE RECEIVED NO GROWTH TO DATE CULTURE WILL BE HELD FOR 5 DAYS BEFORE ISSUING A FINAL NEGATIVE REPORT     Performed at [facility]   Report Status PENDING   Incomplete   Studies/Results: X-ray Chest Pa And Lateral   06/11/2013   CLINICAL DATA:  Shortness of breath.  EXAM: CHEST  2 VIEW  COMPARISON:  Chest x-ray 05/12/2013.  FINDINGS: Mediastinum and hilar structures are normal. Lungs are clear of acute infiltrates. Slight cardiac prominence is most likely related to projection. No evidence of CHF. Mild degenerative changes thoracic spine.  IMPRESSION: No active cardiopulmonary disease, exam unchanged from prior study of 05/12/2013.   Electronically Signed   By: Maisie Fus  Register   On: 06/11/2013 12:15   Medications: I have reviewed the patient's current medications. Scheduled Meds: . aspirin EC  81 mg Oral Daily  . collagenase   Topical Daily  . DAPTOmycin (CUBICIN)  IV  400 mg Intravenous Q24H  . heparin  5,000 Units Subcutaneous Q8H  . HYDROcodone-acetaminophen  1 tablet Oral Q8H  . lisinopril  40 mg Oral Daily  . potassium chloride  40 mEq Oral Q4H  . sodium chloride  3 mL Intravenous Q12H  . spironolactone  100 mg Oral Daily   Continuous Infusions:  PRN Meds:.sodium chloride, hydrALAZINE, morphine injection, sodium chloride   Assessment/Plan: Ms. Valido is a 52 yo woman with PMH of HTN, chronic venous insufficiency, and diastolic CHF who presented with L leg swelling, pain, and ulcers.  # Left leg cellulitis: Her erythema, swelling, and tenderness are improved today. There is no active drainage. BLE Doppler was normal. She remains afebrile, no leukocytosis, and is hemodynamically stable. Blood cultures negative x 1 day. She has received 1 dose of IV vancomycin in the ED and today is day 2 of daptomycin. - Adding  Zosyn today per pharmacy for broader coverage - Pain control with Norco and morphine -  Appreciate wound consult - will continue care with Santyl for chemical debridement and protect wound with foam dressing. Possible Una boot today. - Follow-up on Oct 30th at 9 am at wound care clinic  # Shortness of breath: This has resolved. Her chest x-ray was normal and her pro-BNP was 135, only mildly elevated and not consistent with CHF exacerbation. A troponin x1 was negative. A repeat echocardiogram on 06/11/13 showed an EF of 65-70% and severe LVH consistent with diastolic CHF. Her previous echocardiogram in Feb 2009 had showed an EF of 60%. She is saturating well on room air. - Started ASA 81 mg qd   # Hypertension: She had a max blood pressure of 200/110 this morning, likely exacerbated by pain.  - Continue home meds: lisinopril 40 mg, spironolactone 100 mg - Discontinued amlodipine given risk of worsening edema   # AKI: Her BUN and creatinine were elevated to 31 and 1.20 on admission but soon after resolved to her baseline at 21 and 0.85. - Replenish K as needed - Continue home meds as above. - Repeat BMET tomorrow AM.  # GERD: This is stable and she is not any any home medications. - Continue to observe.    This is a Psychologist, occupational Note.  The care of the patient was discussed with Dr. [name] and Dr. Clyde Lundborg and the assessment and plan formulated with their assistance.  Please see their attached note for official documentation of the daily encounter.   LOS: 1 day   Kerrie Pleasure, Med Student 06/12/2013, 8:57 AM

## 2013-06-12 NOTE — Progress Notes (Signed)
ANTIBIOTIC CONSULT NOTE - INITIAL  Pharmacy Consult for Zosyn Indication: Left leg cellulitis  No Known Allergies  Patient Measurements: Height: 5\' 8"  (172.7 cm) Weight: 225 lb 9.6 oz (102.331 kg) IBW/kg (Calculated) : 63.9 Adjusted Body Weight: n/a  Vital Signs: Temp: 98.5 F (36.9 C) (10/23 0521) Temp src: Oral (10/23 0521) BP: 181/91 mmHg (10/23 0910) Pulse Rate: 69 (10/23 0910) Intake/Output from previous day: 10/22 0701 - 10/23 0700 In: 731 [P.O.:600; I.V.:23; IV Piggyback:108] Out: 3 [Urine:2; Stool:1] Intake/Output from this shift:    Labs:  Recent Labs  06/11/13 0135 06/11/13 0143 06/11/13 1050 06/12/13 0620  WBC 6.2  --   --  5.3  HGB 10.8* 12.6  --  10.3*  PLT 390  --   --  344  CREATININE  --  1.20* 0.85 0.80   Estimated Creatinine Clearance: 103 ml/min (by C-G formula based on Cr of 0.8). No results found for this basename: VANCOTROUGH, VANCOPEAK, VANCORANDOM, GENTTROUGH, GENTPEAK, GENTRANDOM, TOBRATROUGH, TOBRAPEAK, TOBRARND, AMIKACINPEAK, AMIKACINTROU, AMIKACIN,  in the last 72 hours   Microbiology: Recent Results (from the past 720 hour(s))  CULTURE, BLOOD (ROUTINE X 2)     Status: None   Collection Time    06/11/13  9:18 AM      Result Value Range Status   Specimen Description BLOOD LEFT ANTECUBITAL   Final   Special Requests BOTTLES DRAWN AEROBIC ONLY 10CC   Final   Culture  Setup Time     Final   Value: 06/11/2013 15:10     Performed at [facility]   Culture     Final   Value:        BLOOD CULTURE RECEIVED NO GROWTH TO DATE CULTURE WILL BE HELD FOR 5 DAYS BEFORE ISSUING A FINAL NEGATIVE REPORT     Performed at [facility]   Report Status PENDING   Incomplete  CULTURE, BLOOD (ROUTINE X 2)     Status: None   Collection Time    06/11/13  9:22 AM      Result Value Range Status   Specimen Description BLOOD HAND RIGHT   Final   Special Requests BOTTLES DRAWN AEROBIC AND ANAEROBIC 10CC   Final   Culture  Setup Time     Final    Value: 06/11/2013 15:10     Performed at [facility]   Culture     Final   Value:        BLOOD CULTURE RECEIVED NO GROWTH TO DATE CULTURE WILL BE HELD FOR 5 DAYS BEFORE ISSUING A FINAL NEGATIVE REPORT     Performed at [facility]   Report Status PENDING   Incomplete    Medical History: Past Medical History  Diagnosis Date  . Hypertension   . Venous stasis ulcers   . Diastolic heart failure   . Hypokalemia   . Dyslipidemia     pt denies this hx on 05/12/2013  . Anemia   . CHF (congestive heart failure)   . Chronic bronchitis     "probably once/yr" (05/12/2013)  . Exertional shortness of breath   . GERD (gastroesophageal reflux disease)     Medications:  Prescriptions prior to admission  Medication Sig Dispense Refill  . lisinopril (PRINIVIL,ZESTRIL) 20 MG tablet Take 2 tablets (40 mg total) by mouth daily.  60 tablet  11  . spironolactone (ALDACTONE) 100 MG tablet Take 1 tablet (100 mg total) by mouth daily.  30 tablet  11   Assessment: Patient  is a 52 y.o.f who presented with L lef swelling, pain and ulcers. Currently on daptomycin with no active drainage. BLE doppler normal. Remains afebrile. WBC wnl and hemodynamically stable. Per ID, LLE shows areas of maceration as well as foul odor and zosyn will be added to expand coverage. CrCl ~ 103 mL/min and renal fx seems to be improving.   Goal of Therapy:  Eradication of infection   Plan:  1. Start Zosyn 3.375 gm IV Q 8 hours.  2. F/u cultures with intent to de-escalate as soon as possible.  3. Monitor renal function.   Vinnie Level, PharmD.  [place] License #[id number] Application for [employer] reciprocity pending  Clinical Pharmacist Pager [phone]

## 2013-06-12 NOTE — Consult Note (Signed)
WOC wound consult follow-up note Reason for Consult:Refer to 10/22 progress note for assessment and plan of care.  Requested to re-assess left leg wounds.  Increasing amt yellow slough as wounds evolve and macerated edges surrounding wounds, small amt drainage and no odor noted at this time.  Pt has been followed by the outpatient wound care prior to admission and was using Santyl and Una boots for topical treatment.  She states her left leg pain has improved, but she does not feel she could tolerate the tightness of an [place] boot at this time.  She agrees to light compression with an ace wrap over the dressing with Santyl.  This should be re-wrapped Q day.  Pt can resume follow-up with outpatient wound care center after discharge.  Recommend home health assistance for dressing change assistance; they can add Una boots when pt feels pain has subsided enough to resume this type of wraps. Please re-consult if further assistance is needed.  Thank-you,  Cammie Mcgee MSN, RN, CWOCN, [place], CNS [phone]

## 2013-06-13 LAB — BASIC METABOLIC PANEL
BUN: 15 mg/dL (ref 6–23)
CO2: 28 mEq/L (ref 19–32)
Calcium: 8.7 mg/dL (ref 8.4–10.5)
Chloride: 102 mEq/L (ref 96–112)
Glucose, Bld: 99 mg/dL (ref 70–99)
Sodium: 138 mEq/L (ref 135–145)

## 2013-06-13 LAB — CBC
HCT: 32.6 % — ABNORMAL LOW (ref 36.0–46.0)
MCH: 27.3 pg (ref 26.0–34.0)
MCV: 87.4 fL (ref 78.0–100.0)
RBC: 3.73 MIL/uL — ABNORMAL LOW (ref 3.87–5.11)
WBC: 5.6 10*3/uL (ref 4.0–10.5)

## 2013-06-13 MED ORDER — HYDRALAZINE HCL 25 MG PO TABS: 25.0000 mg | ORAL_TABLET | Freq: Four times a day (QID) | ORAL | Status: DC

## 2013-06-13 MED ORDER — HYDROCHLOROTHIAZIDE 25 MG PO TABS
25.0000 mg | ORAL_TABLET | Freq: Two times a day (BID) | ORAL | Status: DC
  Administered 2013-06-13: 08:00:00 25 mg via ORAL
  Filled 2013-06-13 (×2): qty 1

## 2013-06-13 MED ORDER — DOXYCYCLINE HYCLATE 100 MG PO TABS: 100.0000 mg | ORAL_TABLET | Freq: Two times a day (BID) | ORAL | Status: DC

## 2013-06-13 MED ORDER — ASPIRIN 81 MG PO TBEC: 81.0000 mg | DELAYED_RELEASE_TABLET | Freq: Every day | ORAL | Status: DC

## 2013-06-13 MED ORDER — HYDROCODONE-ACETAMINOPHEN 5-325 MG PO TABS: 1.0000 | ORAL_TABLET | Freq: Three times a day (TID) | ORAL | Status: DC

## 2013-06-13 MED ORDER — HYDROCHLOROTHIAZIDE 25 MG PO TABS: 25.0000 mg | ORAL_TABLET | Freq: Two times a day (BID) | ORAL | Status: DC

## 2013-06-13 MED ORDER — COLLAGENASE 250 UNIT/GM EX OINT: TOPICAL_OINTMENT | Freq: Every day | CUTANEOUS | Status: DC

## 2013-06-13 NOTE — Progress Notes (Signed)
  Date: 06/13/2013  Patient name: Eulonda Andalon  Medical record number: [id number]  Date of birth: 1960/12/15   This patient has been seen and the plan of care was discussed with the house staff. Please see their note for complete details. I concur with their findings with the following additions/corrections: Please see my note as well.   Ginnie Smart, MD 06/13/2013, 2:27 PM

## 2013-06-13 NOTE — Progress Notes (Signed)
  Date: 06/13/2013  Patient name: [name]  Medical record number: [id number]  Date of birth: [birth year]   This patient has been seen and the plan of care was discussed with the house staff. Please see their note for complete details. I concur with their findings with the following additions/corrections:  Doing well, her wound is healing.  Will send her home with close wound care f/u.  Doxy for 1 week.   Ginnie Smart, MD 06/13/2013, 2:27 PM

## 2013-06-13 NOTE — Progress Notes (Addendum)
7:06 AM Paged dr on call about BP of over 200 systolic since 11pm last night.  7:14 AM BP currently 195/100 manual BP. An additional 5mg  of hydralazine IV given. 25 mg hydralazine PO given at 0501.  7:26 AM Paged 2nd on call pager.

## 2013-06-13 NOTE — Progress Notes (Signed)
[name] to be D/C'd Home per MD order.  Discussed with the patient and all questions fully answered.    Medication List         aspirin 81 MG EC tablet  Take 1 tablet (81 mg total) by mouth daily.     collagenase ointment  Commonly known as:  SANTYL  Apply topically daily.     doxycycline 100 MG tablet  Commonly known as:  VIBRA-TABS  Take 1 tablet (100 mg total) by mouth every 12 (twelve) hours.  Start taking on:  06/14/2013     hydrALAZINE 25 MG tablet  Commonly known as:  APRESOLINE  Take 1 tablet (25 mg total) by mouth every 6 (six) hours.     hydrochlorothiazide 25 MG tablet  Commonly known as:  HYDRODIURIL  Take 1 tablet (25 mg total) by mouth 2 (two) times daily.     HYDROcodone-acetaminophen 5-325 MG per tablet  Commonly known as:  NORCO/VICODIN  Take 1 tablet by mouth every 8 (eight) hours.     lisinopril 20 MG tablet  Commonly known as:  PRINIVIL,ZESTRIL  Take 2 tablets (40 mg total) by mouth daily.     spironolactone 100 MG tablet  Commonly known as:  ALDACTONE  Take 1 tablet (100 mg total) by mouth daily.       Added hydrocholothiazide BID starting this am for elevated BP. BP 178/119 at 1530.   IV catheter discontinued intact. Site without signs and symptoms of complications. Dressing and pressure applied.  An After Visit Summary was printed and given to the patient. Patient escorted via WC, and D/C home via private auto.  Kennyth Arnold D

## 2013-06-13 NOTE — Progress Notes (Signed)
Subjective: [name] feels better this morning. The pain in her leg has much improved. She has kept her legs elevated and wrapped with ACE bandage over the dressing with Santyl. She states that she will continue to do this at home until her follow-up appointment next Thursday with the wound clinic. She plans to start using an [place] boot after that appointment. She is anxious to go home today.  Objective: Vital signs in last 24 hours: Filed Vitals:   06/13/13 0504 06/13/13 0711 06/13/13 0817 06/13/13 1003  BP:  195/100 182/95 159/80  Pulse:   70 85  Temp:    98.2 F (36.8 C)  TempSrc:    Oral  Resp:    20  Height:      Weight: 102.9 kg (226 lb 13.7 oz)     SpO2:    98%   Weight change: 0.568 kg (1 lb 4.1 oz)  Intake/Output Summary (Last 24 hours) at 06/13/13 1137 Last data filed at 06/13/13 0931  Gross per 24 hour  Intake    778 ml  Output      4 ml  Net    774 ml    Physical Exam  Constitutional: She appears well-developed and well-nourished.  Cardiovascular: Normal rate, regular rhythm and normal heart sounds.  Pulmonary/Chest: Effort normal and breath sounds normal. No respiratory distress.  Ext: 1+ BLE edema. There are 2 superficial ulcers on the lateral aspect of her LLE, both superficial and above her ankle. Dressing in place. There is significant skin erosion surrounding the ulcers. No active drainage. Tenderness improved today compared to yesterday.   Lab Results: Basic Metabolic Panel:  Recent Labs Lab 06/11/13 1050 06/12/13 0620 06/13/13 0400  NA 141 138 138  K 3.2* 3.0* 3.7  CL 100 99 102  CO2 29 28 28   GLUCOSE 125* 88 99  BUN 21 15 15   CREATININE 0.85 0.80 0.94  CALCIUM 8.8 8.7 8.7  MG 1.8  --   --    Liver Function Tests:  Recent Labs Lab 06/11/13 1050  AST 17  ALT 13  ALKPHOS 60  BILITOT 0.5  PROT 7.3  ALBUMIN 3.6   CBC:  Recent Labs Lab 06/12/13 0620 06/13/13 0400  WBC 5.3 5.6  HGB 10.3* 10.2*  HCT 33.0* 32.6*  MCV 88.0 87.4    PLT 344 330   Cardiac Enzymes:  Recent Labs Lab 06/11/13 1930 06/12/13 0620  CKTOTAL  --  106  TROPONINI <0.30  --    BNP:  Recent Labs Lab 06/11/13 1050  PROBNP 135.4*   Coagulation:  Recent Labs Lab 06/11/13 1050  LABPROT 14.1  INR 1.11   Urine Drug Screen: Drugs of Abuse     Component Value Date/Time   LABOPIA POSITIVE* 12/30/2010 0436   COCAINSCRNUR NONE DETECTED 12/30/2010 0436   LABBENZ NONE DETECTED 12/30/2010 0436   AMPHETMU NONE DETECTED 12/30/2010 0436   THCU NONE DETECTED 12/30/2010 0436   LABBARB  Value: NONE DETECTED        DRUG SCREEN FOR MEDICAL PURPOSES ONLY.  IF CONFIRMATION IS NEEDED FOR ANY PURPOSE, NOTIFY LAB WITHIN 5 DAYS.        LOWEST DETECTABLE LIMITS FOR URINE DRUG SCREEN Drug Class       Cutoff (ng/mL) Amphetamine      1000 Barbiturate      200 Benzodiazepine   200 Tricyclics       300 Opiates          300 Cocaine  300 THC              50 12/30/2010 0436   Micro Results: Recent Results (from the past 240 hour(s))  CULTURE, BLOOD (ROUTINE X 2)     Status: None   Collection Time    06/11/13  9:18 AM      Result Value Range Status   Specimen Description BLOOD LEFT ANTECUBITAL   Final   Special Requests BOTTLES DRAWN AEROBIC ONLY 10CC   Final   Culture  Setup Time     Final   Value: 06/11/2013 15:10     Performed at [facility]   Culture     Final   Value:        BLOOD CULTURE RECEIVED NO GROWTH TO DATE CULTURE WILL BE HELD FOR 5 DAYS BEFORE ISSUING A FINAL NEGATIVE REPORT     Performed at [facility]   Report Status PENDING   Incomplete  CULTURE, BLOOD (ROUTINE X 2)     Status: None   Collection Time    06/11/13  9:22 AM      Result Value Range Status   Specimen Description BLOOD HAND RIGHT   Final   Special Requests BOTTLES DRAWN AEROBIC AND ANAEROBIC 10CC   Final   Culture  Setup Time     Final   Value: 06/11/2013 15:10     Performed at [facility]   Culture     Final   Value:        BLOOD CULTURE  RECEIVED NO GROWTH TO DATE CULTURE WILL BE HELD FOR 5 DAYS BEFORE ISSUING A FINAL NEGATIVE REPORT     Performed at [facility]   Report Status PENDING   Incomplete   Medications: I have reviewed the patient's current medications. Scheduled Meds: . aspirin EC  81 mg Oral Daily  . collagenase   Topical Daily  . DAPTOmycin (CUBICIN)  IV  400 mg Intravenous Q24H  . heparin  5,000 Units Subcutaneous Q8H  . hydrALAZINE  25 mg Oral Q6H  . hydrochlorothiazide  25 mg Oral BID  . HYDROcodone-acetaminophen  1 tablet Oral Q8H  . lisinopril  40 mg Oral Daily  . piperacillin-tazobactam (ZOSYN)  IV  3.375 g Intravenous Q8H  . sodium chloride  3 mL Intravenous Q12H  . spironolactone  100 mg Oral Daily   PRN Meds:.sodium chloride, hydrALAZINE, morphine injection, sodium chloride   Assessment/Plan: [name] is a 52 yo woman with PMH of HTN, chronic venous insufficiency, and diastolic CHF who presented with L leg swelling, pain, and ulcers.   # Left leg cellulitis: Her erythema, swelling, and tenderness continue to improve. There is no active drainage. BLE Doppler was normal. She remains afebrile, no leukocytosis, and is hemodynamically stable. Blood cultures negative x 2 days. She has received 1 dose of IV vancomycin in the ED and today is day 3 of daptomycin and day 2 of zosyn.  - Anticipated discharge today with 7 day course of doxycycline - Pain control with Norco and morphine  - Will provide instructions to continue care with Santyl for chemical debridement and protect wound with foam dressing until follow-up appointment.  - Follow-up on Oct 30th at 9 am at wound care clinic  - Follow-up on Oct 31st at 10:45 am at internal medicine clinic  # Hypertension: Her blood pressures remained elevated throughout yesterday with systolics in the 180-200s despite starting hydralazine 25 mg q6hr. Today we added hydrochlorothiazide 25 mg bid and  her blood pressure improved to 159/80 after one  dose. - Continue home meds: lisinopril 40 mg, spironolactone 100 mg  - Continue: hydralazine 25 mg q6hr and hydrochlorothiazide 25 mg bid - Discontinued amlodipine given risk of worsening edema   # Shortness of breath, resolved: Her chest x-ray was normal and her pro-BNP was 135, only mildly elevated and not consistent with CHF exacerbation. A troponin x1 was negative. A repeat echocardiogram on 06/11/13 showed an EF of 65-70% and severe LVH consistent with diastolic CHF. Her previous echocardiogram in Feb 2009 had showed an EF of 60%. She is saturating well on room air.  - Continue ASA 81 mg qd   # AKI, resolved: Her BUN and creatinine were elevated to 31 and 1.20 on admission but soon after resolved to her baseline at 21 and 0.85.  - Replenish K as needed  - Continue home meds as above.   # GERD: This is stable and she is not any any home medications.  - Continue to observe.    This is a Psychologist, occupational Note.  The care of the patient was discussed with Dr. [name] and Dr. Bosie Clos and the assessment and plan formulated with their assistance.  Please see their attached note for official documentation of the daily encounter.   LOS: 2 days   Kerrie Pleasure, Med Student 06/13/2013, 11:37 AM

## 2013-06-13 NOTE — Discharge Summary (Signed)
Name: [name] MRN: [id number] DOB: 07/09/1961 52 y.o. PCP: Boykin Peek, MD  Date of Admission: 06/11/2013 12:42 AM Date of Discharge: 06/13/2013 Attending Physician: Ginnie Smart, MD  Discharge Diagnosis: Principal Problem:   Cellulitis of leg with ulcers Active Problems:   Hypertension   Hyperlipidemia   Venous stasis ulcer   Chronic diastolic CHF (congestive heart failure)   GERD (gastroesophageal reflux disease)  Discharge Medications:   Medication List         aspirin 81 MG EC tablet  Take 1 tablet (81 mg total) by mouth daily.     collagenase ointment  Commonly known as:  SANTYL  Apply topically daily.     doxycycline 100 MG tablet  Commonly known as:  VIBRA-TABS  Take 1 tablet (100 mg total) by mouth every 12 (twelve) hours.  Start taking on:  06/14/2013     hydrALAZINE 25 MG tablet  Commonly known as:  APRESOLINE  Take 1 tablet (25 mg total) by mouth every 6 (six) hours.     hydrochlorothiazide 25 MG tablet  Commonly known as:  HYDRODIURIL  Take 1 tablet (25 mg total) by mouth 2 (two) times daily.     HYDROcodone-acetaminophen 5-325 MG per tablet  Commonly known as:  NORCO/VICODIN  Take 1 tablet by mouth every 8 (eight) hours.     lisinopril 20 MG tablet  Commonly known as:  PRINIVIL,ZESTRIL  Take 2 tablets (40 mg total) by mouth daily.     spironolactone 100 MG tablet  Commonly known as:  ALDACTONE  Take 1 tablet (100 mg total) by mouth daily.        Disposition and follow-up:   [name] was discharged from [facility] in Stable condition.  At the hospital follow up visit please address: 1. Resolution of left leg cellulitis and completion of doxycycline antibiotic course 2. Management of hypertension 3. Labs / imaging needed at time of follow-up: none 4. Pending labs / test needing follow-up: final blood cultures  Follow-up Appointments: Follow-up Information   Follow up with [facility]              On 06/19/2013. (9 am with Dr. Leanord Hawking)    Contact information:   [address] [place] [phone]      Follow up with Genelle Gather, MD On 06/20/2013. (Internal medicine clinic, ground floor 10:45 am)    Specialty:  Internal Medicine   Contact information:   [address] [place] [address] [phone]       Discharge Instructions: Discharge Orders   Future Appointments Provider Department Dept Phone   06/19/2013 9:00 AM [facility] [phone]   06/20/2013 10:45 AM Genelle Gather, MD [facility] [phone]   Future Orders Complete By Expires   Call MD for:  persistant nausea and vomiting  As directed    Call MD for:  severe uncontrolled pain  As directed    Call MD for:  temperature >100.4  As directed    Diet - low sodium heart healthy  As directed    Discharge instructions  As directed    Comments:     Fill your prescription for antibiotics and new blood pressure medication.  They should both be on the $4 price list. Keep your appointment with [facility].   Increase activity slowly  As  directed       Consultations: Wound Care    Procedures Performed:  X-ray Chest Pa And Lateral   06/11/2013   CLINICAL DATA:  Shortness of breath.  EXAM: CHEST  2 VIEW  COMPARISON:  Chest x-ray 05/12/2013.  FINDINGS: Mediastinum and hilar structures are normal. Lungs are clear of acute infiltrates. Slight cardiac prominence is most likely related to projection. No evidence of CHF. Mild degenerative changes thoracic spine.  IMPRESSION: No active cardiopulmonary disease, exam unchanged from prior study of 05/12/2013.   Electronically Signed   By: Maisie Fus  Register   On: 06/11/2013 12:15     Admission HPI:  Patient is a 52 year old lady with past medical history of hypertension, chronic venous insufficiency  in legs, diastolic congestive heart failure, GERD, who presents with left leg swelling, leg pain and SOB.  Patient has a history of chronic venous insufficiency with leg ulcers. She was previously treated at wound care center. She has not been able to get into the wound clinic recently. She was seen in clinic on 10/14 because of worsening leg edema and skin ulcers on the left leg. Her blood pressure was also elevated. Her medications were adjusted by her PCP. Her lisinopril dosage was increased from 20 to 40 mg daily. She was given prescription of spironolactone, but she has not started taking this medication yet. Patient reports that her leg edema and leg pain have been progressively getting worse in the past 2 weeks. She noticed some yellow-colored drainage from her left leg ulcers. She does not have fever or chills. She also has mild shortness of breath, which started gradually in past 3 days. She has a mild dry cough. No chest pain or wheezing. No recent long distance traveling history. She called EMS last night and was brought to [facility] ED. She was transferred to [facility] hospital this AM. Patient received 1 dose of IV vancomycin in [facility] ED.    Admission Physical Exam:  Filed Vitals:    06/11/13 0311  06/11/13 0400  06/11/13 0430  06/11/13 0531   BP:  158/98  157/104  149/88  174/100   Pulse:  70  78  77  71   Temp:  97.7 F (36.5 C)    97.9 F (36.6 C)   TempSrc:  Oral    Oral   Resp:  13  15  11  16    Height:     5\' 8"  (1.727 m)   Weight:     226 lb 4.8 oz (102.649 kg)   SpO2:  98%  97%  97%  97%    General: Not in acute distress.  HEENT: PERRL, EOMI, no scleral icterus, No JVD or bruit  Cardiac: S1/S2, RRR, Grade II/VI systolic murmur, No gallops, or rubs  Pulm: decreased air movement on the right lower field posteriorly. No rales, wheezing, rhonchi or rubs.  Abd: Soft, nondistended, nontender, no rebound pain, no organomegaly, BS present  Ext: Bilateral lower leg pitting edema, 1+ on the right  leg, 2+ on the left leg. Left lower leg has multiple ulcers with erythema, tenderness and warmth. Distal N/V intact.  Musculoskeletal: Normal range of motion.  Neuro: Alert and oriented X3, cranial nerves II-XII grossly intact, muscle strength 5/5 in all extremeties, sensation to light touch intact.  Psych: Patient is not psychotic, no suicidal or hemocidal ideation.   Hospital Course by problem list:   #1  Left leg cellulitis: Ms. Kysar presented with left leg erythema, redness, and swelling associated  with 2 non-healing ulcers in the setting of chronic venous insufficiency secondary to diastolic heart failure. She reported having yellow colored drainage prior to admission but has been without fever or leukocytosis. Given her mildly asymmetric lower extremity edema bilateral lower extremity Doppler was performed and was negative for DVT. She received one dose of ceftriaxone in the ED and we continued to treat her for cellulitis with daptomycin per pharmacy. Zosyn was added on hospital day #2 for broader coverage. Wound care was consulted with recommendation to continue Santyl for chemical debridement and protection with foam dressing and ACE wrap. An Neomia Dear boot was not recommended due to her pain and deferred for consideration at her outpatient wound care appointment. Blood cultures (obtained after receiving vancomycin in the ED) were negative x 2 days at the time of discharge. Over the course of her hospitalization, Ms. Sorter reported resolution of her leg pain unless deeply palpated.  She was discharged on hospital day #2 with a follow-up appointment at the wound care clinic on October 30th.    Recent Labs Lab 06/11/13 0143 06/11/13 1050 06/12/13 0620 06/13/13 0400  CREATININE 1.20* 0.85 0.80 0.94     #2 Diastolic Congestive Heart Failure without exacerbation: Ms. Dhaliwal complained of shortness of breath for 3 days before admission. She has a history of diastolic congestive heart failure  with her last echocardiogram on 09/28/07 showing an EF of 60%. Her chest x-ray showed no evidence of cardiogenic pulmonary edema and her proBNP was only borderline elevated at 135. She had no signs or symptoms to suggest pulmonary embolism and a bilateral lower extremity Doppler was negative for DVT. A single troponin was negative. We started her on aspirin 81 mg daily. A repeat echocardiogram showed LVH and diastolic congestive heart failure with an ejection fraction of 65-70%. Her shortness of breath resolved on hospital day # and she remained without complaints on day of discharge.   Recent Labs Lab 06/11/13 1050  PROBNP 135.4*    #3 Hypertension: On admission, her blood pressure was elevated at 174/100. Her home regimen was lisinopril 40 mg daily. She had also recently been started on spironolactone 100 mg daily but reported that she had not started this medication yet. Her PCP is concerned that she may have primary hyperaldosteronism given her persistent hypokalemia. We continued her home lisinopril and spironolactone. On hospital day #2 hydralazine 25 mg q6hr was added given her persistent elevated blood pressure without significant control. Her blood pressures remained elevated with systolics in the 200s thus hydrochlorothiazide 25 mg bid on hospital day #3 was added to her regimen. After one dose of hydrochlorothiazide, her blood pressure improved to 159/80 before discharge. Of note, amlodipine was considered for this patient but deferred given her due to concern for worsening of leg edema.   #4 Acute Kidney Injury: Her BUN and creatinine on admission were 31 and 1.20 respectively but resolved to her baseline a few hours later to 21 and 0.85 after IV fluid administration.   Recent Labs Lab 06/11/13 0143 06/11/13 1050 06/12/13 0620 06/13/13 0400  CREATININE 1.20* 0.85 0.80 0.94      Discharge Vitals:   BP 189/100  Pulse 69  Temp(Src) 97.8 F (36.6 C) (Oral)  Resp 20  Ht 5\' 8"  (1.727  m)  Wt 226 lb 13.7 oz (102.9 kg)  BMI 34.5 kg/m2  SpO2 100%  Discharge Labs:  Results for orders placed during the hospital encounter of 06/11/13 (from the past 24 hour(s))  BASIC METABOLIC PANEL  Status: Abnormal   Collection Time    06/13/13  4:00 AM      Result Value Range   Sodium 138  135 - 145 mEq/L   Potassium 3.7  3.5 - 5.1 mEq/L   Chloride 102  96 - 112 mEq/L   CO2 28  19 - 32 mEq/L   Glucose, Bld 99  70 - 99 mg/dL   BUN 15  6 - 23 mg/dL   Creatinine, Ser 6.57  0.50 - 1.10 mg/dL   Calcium 8.7  8.4 - 84.6 mg/dL   GFR calc non Af Amer 69 (*) >90 mL/min   GFR calc Af Amer 79 (*) >90 mL/min  CBC     Status: Abnormal   Collection Time    06/13/13  4:00 AM      Result Value Range   WBC 5.6  4.0 - 10.5 K/uL   RBC 3.73 (*) 3.87 - 5.11 MIL/uL   Hemoglobin 10.2 (*) 12.0 - 15.0 g/dL   HCT 96.2 (*) 95.2 - 84.1 %   MCV 87.4  78.0 - 100.0 fL   MCH 27.3  26.0 - 34.0 pg   MCHC 31.3  30.0 - 36.0 g/dL   RDW 32.4  40.1 - 02.7 %   Platelets 330  150 - 400 K/uL    Signed: Manuela Schwartz, MD 06/13/2013, 3:28 PM   Time Spent on Discharge: >35 minutes Services Ordered on Discharge: Wound Care f/u Equipment Ordered on Discharge: None

## 2013-06-17 LAB — CULTURE, BLOOD (ROUTINE X 2): Culture: NO GROWTH

## 2013-06-19 ENCOUNTER — Encounter (HOSPITAL_BASED_OUTPATIENT_CLINIC_OR_DEPARTMENT_OTHER): Payer: Self-pay | Attending: Internal Medicine

## 2013-06-19 DIAGNOSIS — L02419 Cutaneous abscess of limb, unspecified: Secondary | ICD-10-CM | POA: Insufficient documentation

## 2013-06-19 DIAGNOSIS — I872 Venous insufficiency (chronic) (peripheral): Secondary | ICD-10-CM | POA: Insufficient documentation

## 2013-06-19 DIAGNOSIS — I10 Essential (primary) hypertension: Secondary | ICD-10-CM | POA: Insufficient documentation

## 2013-06-19 DIAGNOSIS — L97909 Non-pressure chronic ulcer of unspecified part of unspecified lower leg with unspecified severity: Secondary | ICD-10-CM | POA: Insufficient documentation

## 2013-06-19 DIAGNOSIS — I87319 Chronic venous hypertension (idiopathic) with ulcer of unspecified lower extremity: Secondary | ICD-10-CM | POA: Insufficient documentation

## 2013-06-20 ENCOUNTER — Encounter: Payer: Self-pay | Admitting: Internal Medicine

## 2013-06-20 ENCOUNTER — Ambulatory Visit: Payer: Self-pay | Admitting: Internal Medicine

## 2013-06-20 NOTE — Progress Notes (Signed)
[facility]  NAMEMarland Kitchen  [name], [name]            ACCOUNT NO.:  [id number]  MEDICAL RECORD NO.:  [id number]      DATE OF BIRTH:  01/16/1961  PHYSICIAN:  Maxwell Caul, M.D.      VISIT DATE:                                  OFFICE VISIT   [name] is a lady.  We know from a stay here through the mid part of 2014 from April to July with chronic venous hypertension, stasis dermatitis, and resultant venous stasis ulcers.  We were able to heal these in July.  The patient tells me she did fairly well up until roughly 2 weeks ago.  She developed erythema and pain on the left leg. She was actually hospitalized from June 11, 2013, through June 13, 2013, with cellulitis of the left leg.  She received intravenous antibiotics and was discharged on doxycycline which she is still taking. The appointment was arranged from the hospital.  In the hospital, she was treated for left leg cellulitis.  Duplex ultrasound was negative for DVT.  She received Zosyn and ultimately a Unna boot wrap was applied.  Blood cultures were negative.  She spent 2 days in hospital, and was discharged on doxycycline, also noted to have acute renal insufficiency, hypertension, and diastolically mediated heart failure.  On examination, temperature 98.7, pulse 87, respirations 16, blood pressure 202/102.  Her weight is 230 pounds.  The area in question was over the left lateral lower extremity, there were 3 small open areas here.  There is still surrounding erythema and tenderness here. However, all of this is better according to the patient.  Also of note, her peripheral pulses were palpable although we could not calculate her ABIs.  IMPRESSION:  Venous stasis, venous hypertension, and secondary ulcerations.  She also has coexistent cellulitis with some degree of erythema and swelling around the wounds.  I note that this clinically does not look like anything but cellulitis.  There is  certainly no evidence of a DVT at the bedside and I note the negative duplex ultrasound.  To these areas, we applied silver alginate.  She will be wrapped in a Kerlix, Coban.  We will recheck the wound area on Monday and follow up on her cellulitis.  She is to continue on her doxycycline.          ______________________________ Maxwell Caul, M.D.     MGR/MEDQ  D:  06/19/2013  T:  06/20/2013  Job:  [id number]

## 2013-06-23 ENCOUNTER — Encounter (HOSPITAL_BASED_OUTPATIENT_CLINIC_OR_DEPARTMENT_OTHER): Payer: Self-pay | Attending: General Surgery

## 2013-06-23 DIAGNOSIS — L02419 Cutaneous abscess of limb, unspecified: Secondary | ICD-10-CM | POA: Insufficient documentation

## 2013-06-23 DIAGNOSIS — L97909 Non-pressure chronic ulcer of unspecified part of unspecified lower leg with unspecified severity: Secondary | ICD-10-CM | POA: Insufficient documentation

## 2013-06-23 DIAGNOSIS — I87319 Chronic venous hypertension (idiopathic) with ulcer of unspecified lower extremity: Secondary | ICD-10-CM | POA: Insufficient documentation

## 2013-07-24 ENCOUNTER — Encounter (HOSPITAL_BASED_OUTPATIENT_CLINIC_OR_DEPARTMENT_OTHER): Payer: Self-pay | Attending: Internal Medicine

## 2013-07-24 DIAGNOSIS — I87339 Chronic venous hypertension (idiopathic) with ulcer and inflammation of unspecified lower extremity: Secondary | ICD-10-CM | POA: Insufficient documentation

## 2013-07-24 DIAGNOSIS — L02419 Cutaneous abscess of limb, unspecified: Secondary | ICD-10-CM | POA: Insufficient documentation

## 2013-08-28 ENCOUNTER — Encounter (HOSPITAL_BASED_OUTPATIENT_CLINIC_OR_DEPARTMENT_OTHER): Payer: Self-pay | Attending: Internal Medicine

## 2013-08-28 DIAGNOSIS — I87339 Chronic venous hypertension (idiopathic) with ulcer and inflammation of unspecified lower extremity: Secondary | ICD-10-CM | POA: Insufficient documentation

## 2013-08-28 DIAGNOSIS — L97909 Non-pressure chronic ulcer of unspecified part of unspecified lower leg with unspecified severity: Principal | ICD-10-CM

## 2013-11-14 ENCOUNTER — Other Ambulatory Visit: Payer: Self-pay | Admitting: Internal Medicine

## 2013-11-14 ENCOUNTER — Encounter: Payer: Self-pay | Admitting: Internal Medicine

## 2013-11-14 DIAGNOSIS — E876 Hypokalemia: Secondary | ICD-10-CM | POA: Insufficient documentation

## 2013-11-17 ENCOUNTER — Ambulatory Visit (INDEPENDENT_AMBULATORY_CARE_PROVIDER_SITE_OTHER): Payer: Self-pay | Admitting: Internal Medicine

## 2013-11-17 ENCOUNTER — Encounter: Payer: Self-pay | Admitting: Internal Medicine

## 2013-11-17 VITALS — BP 195/110 | HR 81 | Temp 99.6°F | Ht 68.0 in | Wt 224.0 lb

## 2013-11-17 DIAGNOSIS — F17200 Nicotine dependence, unspecified, uncomplicated: Secondary | ICD-10-CM

## 2013-11-17 DIAGNOSIS — I10 Essential (primary) hypertension: Secondary | ICD-10-CM

## 2013-11-17 DIAGNOSIS — L97909 Non-pressure chronic ulcer of unspecified part of unspecified lower leg with unspecified severity: Secondary | ICD-10-CM

## 2013-11-17 DIAGNOSIS — Z720 Tobacco use: Secondary | ICD-10-CM | POA: Insufficient documentation

## 2013-11-17 DIAGNOSIS — Z0000 Encounter for general adult medical examination without abnormal findings: Secondary | ICD-10-CM

## 2013-11-17 DIAGNOSIS — I83009 Varicose veins of unspecified lower extremity with ulcer of unspecified site: Secondary | ICD-10-CM

## 2013-11-17 DIAGNOSIS — E876 Hypokalemia: Secondary | ICD-10-CM

## 2013-11-17 LAB — BASIC METABOLIC PANEL WITH GFR
BUN: 19 mg/dL (ref 6–23)
CO2: 33 mEq/L — ABNORMAL HIGH (ref 19–32)
Calcium: 9.3 mg/dL (ref 8.4–10.5)
Chloride: 98 mEq/L (ref 96–112)
Creat: 0.97 mg/dL (ref 0.50–1.10)
GFR, EST AFRICAN AMERICAN: 77 mL/min
GFR, EST NON AFRICAN AMERICAN: 67 mL/min
Glucose, Bld: 71 mg/dL (ref 70–99)
POTASSIUM: 3.3 meq/L — AB (ref 3.5–5.3)
Sodium: 139 mEq/L (ref 135–145)

## 2013-11-17 LAB — CBC WITH DIFFERENTIAL/PLATELET
BASOS PCT: 0 % (ref 0–1)
Basophils Absolute: 0 10*3/uL (ref 0.0–0.1)
Eosinophils Absolute: 0.1 10*3/uL (ref 0.0–0.7)
Eosinophils Relative: 1 % (ref 0–5)
HCT: 38.6 % (ref 36.0–46.0)
Hemoglobin: 11.9 g/dL — ABNORMAL LOW (ref 12.0–15.0)
LYMPHS ABS: 1.7 10*3/uL (ref 0.7–4.0)
Lymphocytes Relative: 30 % (ref 12–46)
MCH: 27.3 pg (ref 26.0–34.0)
MCHC: 30.8 g/dL (ref 30.0–36.0)
MCV: 88.5 fL (ref 78.0–100.0)
Monocytes Absolute: 0.6 10*3/uL (ref 0.1–1.0)
Monocytes Relative: 10 % (ref 3–12)
NEUTROS ABS: 3.4 10*3/uL (ref 1.7–7.7)
NEUTROS PCT: 59 % (ref 43–77)
Platelets: 322 10*3/uL (ref 150–400)
RBC: 4.36 MIL/uL (ref 3.87–5.11)
RDW: 13.8 % (ref 11.5–15.5)
WBC: 5.7 10*3/uL (ref 4.0–10.5)

## 2013-11-17 LAB — MAGNESIUM: MAGNESIUM: 1.8 mg/dL (ref 1.5–2.5)

## 2013-11-17 MED ORDER — TRIAMTERENE-HCTZ 75-50 MG PO TABS: 1.0000 | ORAL_TABLET | Freq: Every day | ORAL | Status: DC

## 2013-11-17 NOTE — Progress Notes (Signed)
Case discussed with Dr. Gill at the time of the visit.  We reviewed the resident's history and exam and pertinent patient test results.  I agree with the assessment, diagnosis and plan of care documented in the resident's note. 

## 2013-11-17 NOTE — Assessment & Plan Note (Addendum)
-  pt discharged from wound care clinic several months ago and has been doing well since -will continue to monitor for symptoms/signs of infection -advised her to continue to wear TED hose if possible and keep her feet elevated at/above the level of her heart  -discussed the importance of weight loss and smoking cessation

## 2013-11-17 NOTE — Assessment & Plan Note (Signed)
  Assessment: Progress toward smoking cessation:  smoking less Barriers to progress toward smoking cessation:   unwillingness to quit Comments: pt states she has "cut back"  Plan: Instruction/counseling given:  I counseled patient on the dangers of tobacco use, advised patient to stop smoking, and reviewed strategies to maximize success. Educational resources provided:  [employer] account manager) brochure Self management tools provided:  smoking cessation plan (STAR Quit Plan) Medications to assist with smoking cessation:  None; pt may be able to obtain free patches from the 1-800-quit-now Patient agreed to the following self-care plans for smoking cessation: call [employer] (1-800-QUIT-NOW)  Other plans: stressed the importance of smoking cessation for venous ulcers

## 2013-11-17 NOTE — Assessment & Plan Note (Signed)
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   Plan: Medications: will try to simplify her regimen while keeping costs in mind therefore, will keep her on 40mg  lisinopril daily (going up on this may not give her better BP control); will discontinue HCTZ 25mg  daily and add maxzide 75/50mg  daily.  Educational resources provided: Designer, jewellery tools provided: home blood pressure logbook Other plans: check BMP and magnesium today (had hypokalemia); will plan for a follow-up visit for BP recheck in 1 month

## 2013-11-17 NOTE — Patient Instructions (Signed)
Thank you for your visit today.    Please return to the internal medicine clinic in 1 month(s) or sooner if needed to recheck your blood presure.     Please take all medications as prescribed.     I have made the following additions/changes to your medications: continue lisinopril 40mg  daily; discontinue HCTZ; I am adding a new medication of triamterene/HCTZ 75/50--please take 1 tablet daily--I have sent this to your pharmacy.    I have sent your medication refills to your pharmacy.    I have made the following referrals for you: None   You need the following test(s) for regular health maintenance: Colonoscopy, Mammogram, Pap Smear.   Please be sure to bring all of your medications with you to every visit; this includes herbal supplements, vitamins, eye drops, and any over-the-counter medications.    Should you have any questions regarding your medications and/or any new or worsening symptoms, please be sure to call the clinic at [phone].    If you believe that you are suffering from a life threatening condition or one that may result in the loss of limb or function, then you should call 911 or proceed to the nearest Emergency Department.     A healthy lifestyle and preventative care can promote health and wellness.   Maintain regular health, dental, and eye exams.  Eat a healthy diet. Foods like vegetables, fruits, whole grains, low-fat dairy products, and lean protein foods contain the nutrients you need without too many calories. Decrease your intake of foods high in solid fats, added sugars, and salt. Get information about a proper diet from your caregiver, if necessary.  Regular physical exercise is one of the most important things you can do for your health. Most adults should get at least 150 minutes of moderate-intensity exercise (any activity that increases your heart rate and causes you to sweat) each week. In addition, most adults need muscle-strengthening  exercises on 2 or more days a week.   Maintain a healthy weight. The body mass index (BMI) is a screening tool to identify possible weight problems. It provides an estimate of body fat based on height and weight. Your caregiver can help determine your BMI, and can help you achieve or maintain a healthy weight. For adults 20 years and older:  A BMI below 18.5 is considered underweight.  A BMI of 18.5 to 24.9 is normal.  A BMI of 25 to 29.9 is considered overweight.  A BMI of 30 and above is considered obese.

## 2013-11-17 NOTE — Progress Notes (Signed)
Patient ID: [name], female   DOB: Dec 24, 1960, 53 y.o.   MRN: [id number]     Subjective:   Patient ID: [name] female    DOB: Jan 07, 1961 54 y.o.    MRN: [id number] Health Maintenance Due: Health Maintenance Due  Topic Date Due  . Pap Smear  10/16/1978  . Tetanus/tdap  10/17/1979  . Mammogram  10/16/2010  . Colonoscopy  10/16/2010  . Influenza Vaccine  03/21/2013    _________________________________________________   HPI: Ms.Latavia Leather is a 53 y.o. female here for a routine visit. Pt has a PMH outlined below.  Please see problem-based charting assessment and plan note for further details of medical issues addressed at today's visit.  PMH: Past Medical History  Diagnosis Date  . Hypertension   . Venous stasis ulcers   . Diastolic heart failure   . Hypokalemia   . Dyslipidemia     pt denies this hx on 05/12/2013  . Anemia   . CHF (congestive heart failure)   . Chronic bronchitis     "probably once/yr" (05/12/2013)  . Exertional shortness of breath   . GERD (gastroesophageal reflux disease)     Medications: Current Outpatient Prescriptions on File Prior to Visit  Medication Sig Dispense Refill  . aspirin EC 81 MG EC tablet Take 1 tablet (81 mg total) by mouth daily.  30 tablet  3  . collagenase (SANTYL) ointment Apply topically daily.  15 g  0  . hydrALAZINE (APRESOLINE) 25 MG tablet Take 1 tablet (25 mg total) by mouth every 6 (six) hours.  90 tablet  3  . hydrochlorothiazide (HYDRODIURIL) 25 MG tablet Take 1 tablet (25 mg total) by mouth 2 (two) times daily.  60 tablet  3  . HYDROcodone-acetaminophen (NORCO/VICODIN) 5-325 MG per tablet Take 1 tablet by mouth every 8 (eight) hours.  30 tablet  0  . lisinopril (PRINIVIL,ZESTRIL) 20 MG tablet Take 2 tablets (40 mg total) by mouth daily.  60 tablet  11  . spironolactone (ALDACTONE) 100 MG tablet Take 1 tablet (100 mg total) by mouth daily.  30 tablet  11   No current facility-administered medications  on file prior to visit.    Allergies: No Known Allergies  FH: No family history on file.  SH: History   Social History  . Marital Status: Single    Spouse Name: N/A    Number of Children: N/A  . Years of Education: N/A   Social History Main Topics  . Smoking status: Current Every Day Smoker -- 0.50 packs/day for 36 years    Types: Cigarettes  . Smokeless tobacco: Never Used  . Alcohol Use: 4.2 oz/week    7 Cans of beer per week     Comment: Beer once a week.  . Drug Use: No     Comment: 05/12/2013 "tried it a few days ago; didn't like it"  . Sexual Activity: Not on file   Other Topics Concern  . Not on file   Social History Narrative  . No narrative on file    Review of Systems: Constitutional: Negative for fever, chills and weight loss.  Eyes: Negative for blurred vision.  Respiratory: Negative for cough and shortness of breath.  Cardiovascular: Negative for chest pain, palpitations. Gastrointestinal: Negative for nausea, vomiting, abdominal pain, diarrhea, constipation and blood in stool.  Genitourinary: Negative for dysuria, urgency and frequency.  Musculoskeletal: Negative for myalgias and back pain.  Neurological: Negative for dizziness, weakness and headaches.     Objective:  Vital Signs: There were no vitals filed for this visit.    BP Readings from Last 3 Encounters:  06/13/13 178/119  06/03/13 219/116  05/27/13 202/109    Physical Exam: Constitutional: Vital signs reviewed.  Patient is well-developed and well-nourished in NAD and cooperative with exam.  Head: Normocephalic and atraumatic. Eyes: PERRL, EOMI, conjunctivae nl, no scleral icterus.  Neck: Supple. Cardiovascular: RRR, no MRG. Pulmonary/Chest: normal effort, non-tender to palpation, CTAB, no wheezes, rales, or rhonchi. Abdominal: Soft. NT/ND +BS. Neurological: A&O x3, cranial nerves II-XII are grossly intact, moving all extremities. Extremities: 2+DP b/l; chronic venous stasis  dermatitis with healed ulcer on the lateral RLE; with non-erythematous, non-purulent, non-painful lateral LLE ulcer that does not appear infected as this time.  Skin: Warm, dry and intact.   Most Recent Laboratory Results:  CMP     Component Value Date/Time   NA 138 06/13/2013 0400   K 3.7 06/13/2013 0400   CL 102 06/13/2013 0400   CO2 28 06/13/2013 0400   GLUCOSE 99 06/13/2013 0400   BUN 15 06/13/2013 0400   CREATININE 0.94 06/13/2013 0400   CREATININE 1.32* 06/03/2013 1700   CALCIUM 8.7 06/13/2013 0400   PROT 7.3 06/11/2013 1050   ALBUMIN 3.6 06/11/2013 1050   AST 17 06/11/2013 1050   ALT 13 06/11/2013 1050   ALKPHOS 60 06/11/2013 1050   BILITOT 0.5 06/11/2013 1050   GFRNONAA 69* 06/13/2013 0400   GFRAA 79* 06/13/2013 0400    CBC    Component Value Date/Time   WBC 5.6 06/13/2013 0400   RBC 3.73* 06/13/2013 0400   HGB 10.2* 06/13/2013 0400   HCT 32.6* 06/13/2013 0400   PLT 330 06/13/2013 0400   MCV 87.4 06/13/2013 0400   MCH 27.3 06/13/2013 0400   MCHC 31.3 06/13/2013 0400   RDW 12.8 06/13/2013 0400   LYMPHSABS 1.8 05/12/2013 0850   MONOABS 0.3 05/12/2013 0850   EOSABS 0.0 05/12/2013 0850   BASOSABS 0.0 05/12/2013 0850    Lipid Panel Lab Results  Component Value Date   CHOL 187 05/13/2013   HDL 38* 05/13/2013   LDLCALC 129* 05/13/2013   TRIG 101 05/13/2013   CHOLHDL 4.9 05/13/2013    HA1C Lab Results  Component Value Date   HGBA1C 5.5 05/12/2013    Urinalysis    Component Value Date/Time   COLORURINE YELLOW 05/12/2013 1025   APPEARANCEUR CLEAR 05/12/2013 1025   LABSPEC 1.015 05/12/2013 1025   PHURINE 7.0 05/12/2013 1025   GLUCOSEU NEGATIVE 05/12/2013 1025   HGBUR NEGATIVE 05/12/2013 1025   BILIRUBINUR NEGATIVE 05/12/2013 1025   KETONESUR NEGATIVE 05/12/2013 1025   PROTEINUR NEGATIVE 05/12/2013 1025   UROBILINOGEN 0.2 05/12/2013 1025   NITRITE NEGATIVE 05/12/2013 1025   LEUKOCYTESUR NEGATIVE 05/12/2013 1025    Urine Microalbumin No results found for this  basename: MICROALBUR,  [id number]    Imaging N/A   Assessment & Plan:   Assessment and plan was discussed and formulated with my attending.  Patient should return to the [facility] in 1 month(s).  Topics to be addressed at next visit include: Improved BP control, compliance with BP medications.  Smoking cessation.  Pt needs orange card.  Needs health maintenance.

## 2013-11-17 NOTE — Assessment & Plan Note (Signed)
Lab Results  Component Value Date   K 3.7 06/13/2013   -will recheck BMP (potassium today) and in 1 month

## 2014-01-08 ENCOUNTER — Encounter (HOSPITAL_BASED_OUTPATIENT_CLINIC_OR_DEPARTMENT_OTHER): Payer: Self-pay | Attending: Internal Medicine

## 2014-01-08 DIAGNOSIS — L03119 Cellulitis of unspecified part of limb: Secondary | ICD-10-CM

## 2014-01-08 DIAGNOSIS — L97909 Non-pressure chronic ulcer of unspecified part of unspecified lower leg with unspecified severity: Principal | ICD-10-CM

## 2014-01-08 DIAGNOSIS — I87339 Chronic venous hypertension (idiopathic) with ulcer and inflammation of unspecified lower extremity: Secondary | ICD-10-CM | POA: Insufficient documentation

## 2014-01-08 DIAGNOSIS — L02419 Cutaneous abscess of limb, unspecified: Secondary | ICD-10-CM | POA: Insufficient documentation

## 2014-01-08 DIAGNOSIS — L97809 Non-pressure chronic ulcer of other part of unspecified lower leg with unspecified severity: Secondary | ICD-10-CM | POA: Insufficient documentation

## 2014-01-22 ENCOUNTER — Encounter (HOSPITAL_BASED_OUTPATIENT_CLINIC_OR_DEPARTMENT_OTHER): Payer: Self-pay | Attending: Internal Medicine

## 2014-01-22 DIAGNOSIS — I87319 Chronic venous hypertension (idiopathic) with ulcer of unspecified lower extremity: Secondary | ICD-10-CM | POA: Insufficient documentation

## 2014-01-22 DIAGNOSIS — L97809 Non-pressure chronic ulcer of other part of unspecified lower leg with unspecified severity: Secondary | ICD-10-CM | POA: Insufficient documentation

## 2014-01-22 DIAGNOSIS — L03119 Cellulitis of unspecified part of limb: Secondary | ICD-10-CM

## 2014-01-22 DIAGNOSIS — L97909 Non-pressure chronic ulcer of unspecified part of unspecified lower leg with unspecified severity: Principal | ICD-10-CM | POA: Insufficient documentation

## 2014-01-22 DIAGNOSIS — L02419 Cutaneous abscess of limb, unspecified: Secondary | ICD-10-CM | POA: Insufficient documentation

## 2014-02-19 ENCOUNTER — Encounter (HOSPITAL_BASED_OUTPATIENT_CLINIC_OR_DEPARTMENT_OTHER): Payer: Self-pay | Attending: Internal Medicine

## 2014-02-19 DIAGNOSIS — L97809 Non-pressure chronic ulcer of other part of unspecified lower leg with unspecified severity: Secondary | ICD-10-CM | POA: Insufficient documentation

## 2014-02-19 DIAGNOSIS — I872 Venous insufficiency (chronic) (peripheral): Secondary | ICD-10-CM | POA: Insufficient documentation

## 2014-02-19 DIAGNOSIS — I10 Essential (primary) hypertension: Secondary | ICD-10-CM | POA: Insufficient documentation

## 2014-03-06 ENCOUNTER — Telehealth: Payer: Self-pay | Admitting: Pharmacist

## 2014-03-06 NOTE — Telephone Encounter (Signed)
Lasaundra Riche is a 53 y.o. female who was reviewed by clinical pharmacy for hypertension medication adherence.   Hypertension Regimen and Fill History Pharmacy contacted: [id number]  Medication and Dose Last Filled (days supply) Refills Remaining  Lisinopril 40 mg daily 09/30/13 (30) 11  Hydrochlorothiazide 25 mg twice daily 09/30/13 (30) 0  Triamterene-HCTZ 75-50 mg daily Not filled 11   Pertinent Labs/Vitals     Component Value Date/Time   NA 139 11/17/2013 0924   K 3.3* 11/17/2013 0924   CL 98 11/17/2013 0924   CO2 33* 11/17/2013 0924   GLUCOSE 71 11/17/2013 0924   BUN 19 11/17/2013 0924   CREATININE 0.97 11/17/2013 0924   CREATININE 0.94 06/13/2013 0400   CALCIUM 9.3 11/17/2013 0924   GFRNONAA 67 11/17/2013 0924   GFRNONAA 69* 06/13/2013 0400   GFRAA 77 11/17/2013 0924   GFRAA 79* 06/13/2013 0400   BP Readings from Last 3 Encounters:  11/17/13 195/110  06/13/13 178/119  06/03/13 219/116   A/P:  Thank you for including me in [facility] care.   Pharmacy records show that patient has not been filling BP medications consistently. Also, records do not show that patient has filled the triamterene-HCTZ.  Attempted to contact patient x 2 but was unsuccessful.  Patient due for follow up appointment with PCP.

## 2014-03-19 ENCOUNTER — Encounter: Payer: Self-pay | Admitting: Internal Medicine

## 2014-05-26 ENCOUNTER — Encounter: Payer: Self-pay | Admitting: Internal Medicine

## 2014-06-04 ENCOUNTER — Encounter (HOSPITAL_BASED_OUTPATIENT_CLINIC_OR_DEPARTMENT_OTHER): Payer: Self-pay | Attending: Internal Medicine

## 2014-06-04 ENCOUNTER — Ambulatory Visit (HOSPITAL_COMMUNITY)
Admission: RE | Admit: 2014-06-04 | Discharge: 2014-06-04 | Disposition: A | Discharge status: Home / Self Care | Payer: Self-pay | Source: Ambulatory Visit | Attending: Internal Medicine | Admitting: Internal Medicine

## 2014-06-04 ENCOUNTER — Other Ambulatory Visit (HOSPITAL_COMMUNITY): Payer: Self-pay | Admitting: Internal Medicine

## 2014-06-04 DIAGNOSIS — M25569 Pain in unspecified knee: Secondary | ICD-10-CM | POA: Insufficient documentation

## 2014-06-04 DIAGNOSIS — L97229 Non-pressure chronic ulcer of left calf with unspecified severity: Secondary | ICD-10-CM | POA: Insufficient documentation

## 2014-06-04 DIAGNOSIS — L97519 Non-pressure chronic ulcer of other part of right foot with unspecified severity: Secondary | ICD-10-CM | POA: Insufficient documentation

## 2014-06-04 DIAGNOSIS — L97529 Non-pressure chronic ulcer of other part of left foot with unspecified severity: Secondary | ICD-10-CM | POA: Insufficient documentation

## 2014-06-04 DIAGNOSIS — L97219 Non-pressure chronic ulcer of right calf with unspecified severity: Secondary | ICD-10-CM | POA: Insufficient documentation

## 2014-06-04 DIAGNOSIS — R600 Localized edema: Secondary | ICD-10-CM

## 2014-06-04 DIAGNOSIS — I87313 Chronic venous hypertension (idiopathic) with ulcer of bilateral lower extremity: Secondary | ICD-10-CM | POA: Insufficient documentation

## 2014-06-04 NOTE — Progress Notes (Signed)
*  Preliminary Results* Bilateral lower extremity venous duplex completed. Visualized veins of bilateral lower extremities are negative for deep vein thrombosis. There is no evidence of Baker's cyst bilaterally.  06/04/2014  Maudry Mayhew, RVT, RDCS, RDMS

## 2014-06-05 ENCOUNTER — Encounter (HOSPITAL_COMMUNITY): Payer: Self-pay

## 2014-06-25 ENCOUNTER — Encounter (HOSPITAL_BASED_OUTPATIENT_CLINIC_OR_DEPARTMENT_OTHER): Payer: Self-pay | Attending: Internal Medicine

## 2014-06-25 DIAGNOSIS — L97921 Non-pressure chronic ulcer of unspecified part of left lower leg limited to breakdown of skin: Secondary | ICD-10-CM | POA: Insufficient documentation

## 2014-06-25 DIAGNOSIS — I87333 Chronic venous hypertension (idiopathic) with ulcer and inflammation of bilateral lower extremity: Secondary | ICD-10-CM | POA: Insufficient documentation

## 2014-06-25 DIAGNOSIS — L97911 Non-pressure chronic ulcer of unspecified part of right lower leg limited to breakdown of skin: Secondary | ICD-10-CM | POA: Insufficient documentation

## 2014-07-23 ENCOUNTER — Encounter (HOSPITAL_BASED_OUTPATIENT_CLINIC_OR_DEPARTMENT_OTHER): Payer: Self-pay | Attending: Internal Medicine

## 2014-07-23 DIAGNOSIS — I87332 Chronic venous hypertension (idiopathic) with ulcer and inflammation of left lower extremity: Secondary | ICD-10-CM | POA: Insufficient documentation

## 2014-07-23 DIAGNOSIS — L97921 Non-pressure chronic ulcer of unspecified part of left lower leg limited to breakdown of skin: Secondary | ICD-10-CM | POA: Insufficient documentation

## 2014-09-17 ENCOUNTER — Encounter: Payer: Self-pay | Admitting: Internal Medicine

## 2014-09-24 ENCOUNTER — Encounter: Payer: Self-pay | Admitting: *Deleted

## 2014-10-12 ENCOUNTER — Encounter: Payer: Self-pay | Admitting: Internal Medicine

## 2014-10-19 ENCOUNTER — Encounter: Payer: Self-pay | Admitting: Student

## 2014-10-19 NOTE — Progress Notes (Signed)
Patient ID: [name], female   DOB: [birth year], 54 y.o.   MRN: [id number]   Patient last filled lisinopril and triamterene-hctz on 08/08/14 per [place]. She has 1 refill remaining for both meds. I am working with Dr. Lynnae January and Mannie Stabile on blood pressure medication adherence and I hope this helps for your patient. She has an appointment with PCP on March 21.   BP Readings from Last 3 Encounters:  11/17/13 195/110  06/13/13 178/119  06/03/13 219/116

## 2014-11-05 ENCOUNTER — Telehealth: Payer: Self-pay | Admitting: Internal Medicine

## 2014-11-05 NOTE — Telephone Encounter (Signed)
Call to patient to confirm appointment for 11/09/14 at 3:15 lmtcb

## 2014-11-09 ENCOUNTER — Ambulatory Visit (INDEPENDENT_AMBULATORY_CARE_PROVIDER_SITE_OTHER): Payer: Self-pay | Admitting: Internal Medicine

## 2014-11-09 VITALS — BP 211/112 | HR 89 | Temp 98.6°F

## 2014-11-09 DIAGNOSIS — Z720 Tobacco use: Secondary | ICD-10-CM

## 2014-11-09 DIAGNOSIS — I10 Essential (primary) hypertension: Secondary | ICD-10-CM

## 2014-11-09 DIAGNOSIS — Z0000 Encounter for general adult medical examination without abnormal findings: Secondary | ICD-10-CM

## 2014-11-09 DIAGNOSIS — I519 Heart disease, unspecified: Secondary | ICD-10-CM

## 2014-11-09 DIAGNOSIS — I5189 Other ill-defined heart diseases: Secondary | ICD-10-CM

## 2014-11-09 MED ORDER — LISINOPRIL-HYDROCHLOROTHIAZIDE 20-12.5 MG PO TABS: 2.0000 | ORAL_TABLET | Freq: Every day | ORAL | Status: DC

## 2014-11-09 MED ORDER — LISINOPRIL-HYDROCHLOROTHIAZIDE 20-12.5 MG PO TABS: 1.0000 | ORAL_TABLET | Freq: Every day | ORAL | Status: DC

## 2014-11-09 MED ORDER — LISINOPRIL 40 MG PO TABS: 40.0000 mg | ORAL_TABLET | Freq: Every day | ORAL | Status: DC

## 2014-11-09 NOTE — Patient Instructions (Signed)
Thank you for your visit today.   Please return to the internal medicine clinic in 1 week to recheck your blood pressure and labs.    I have changed your blood pressure medication to lisinopril 20mg /HCTZ 12.5mg .  Please take 2 tabs daily (at the same time).  Do not take any other blood pressure medication but this.    You need the following test(s) for regular health maintenance: mammogram and colonoscopy.   Please be sure to bring all of your medications with you to every visit; this includes herbal supplements, vitamins, eye drops, and any over-the-counter medications.   Should you have any questions regarding your medications and/or any new or worsening symptoms, please be sure to call the clinic at [phone].   If you believe that you are suffering from a life threatening condition or one that may result in the loss of limb or function, then you should call 911 or proceed to the nearest Emergency Department.   DASH Eating Plan DASH stands for "Dietary Approaches to Stop Hypertension." The DASH eating plan is a healthy eating plan that has been shown to reduce high blood pressure (hypertension). Additional health benefits may include reducing the risk of type 2 diabetes mellitus, heart disease, and stroke. The DASH eating plan may also help with weight loss. WHAT DO I NEED TO KNOW ABOUT THE DASH EATING PLAN? For the DASH eating plan, you will follow these general guidelines:  Choose foods with a percent daily value for sodium of less than 5% (as listed on the food label).  Use salt-free seasonings or herbs instead of table salt or sea salt.  Check with your health care provider or pharmacist before using salt substitutes.  Eat lower-sodium products, often labeled as "lower sodium" or "no salt added."  Eat fresh foods.  Eat more vegetables, fruits, and low-fat dairy products.  Choose whole grains. Look for the word "whole" as the first word in the ingredient list.  Choose fish  and skinless chicken or [place] more often than red meat. Limit fish, poultry, and meat to 6 oz (170 g) each day.  Limit sweets, desserts, sugars, and sugary drinks.  Choose heart-healthy fats.  Limit cheese to 1 oz (28 g) per day.  Eat more home-cooked food and less restaurant, buffet, and fast food.  Limit fried foods.  Cook foods using methods other than frying.  Limit canned vegetables. If you do use them, rinse them well to decrease the sodium.  When eating at a restaurant, ask that your food be prepared with less salt, or no salt if possible. WHAT FOODS CAN I EAT? Seek help from a dietitian for individual calorie needs. Grains Whole grain or whole wheat bread. Brown rice. Whole grain or whole wheat pasta. Quinoa, bulgur, and whole grain cereals. Low-sodium cereals. Corn or whole wheat flour tortillas. Whole grain cornbread. Whole grain crackers. Low-sodium crackers. Vegetables Fresh or frozen vegetables (raw, steamed, roasted, or grilled). Low-sodium or reduced-sodium tomato and vegetable juices. Low-sodium or reduced-sodium tomato sauce and paste. Low-sodium or reduced-sodium canned vegetables.  Fruits All fresh, canned (in natural juice), or frozen fruits. Meat and Other Protein Products Ground beef (85% or leaner), grass-fed beef, or beef trimmed of fat. Skinless chicken or [place]. Ground chicken or [place]. Pork trimmed of fat. All fish and seafood. Eggs. Dried beans, peas, or lentils. Unsalted nuts and seeds. Unsalted canned beans. Dairy Low-fat dairy products, such as skim or 1% milk, 2% or reduced-fat cheeses, low-fat ricotta or cottage cheese, or  plain low-fat yogurt. Low-sodium or reduced-sodium cheeses. Fats and Oils Tub margarines without trans fats. Light or reduced-fat mayonnaise and salad dressings (reduced sodium). Avocado. Safflower, olive, or canola oils. Natural peanut or almond butter. Other Unsalted popcorn and pretzels. The items listed above may not be a  complete list of recommended foods or beverages. Contact your dietitian for more options. WHAT FOODS ARE NOT RECOMMENDED? Grains White bread. White pasta. White rice. Refined cornbread. Bagels and croissants. Crackers that contain trans fat. Vegetables Creamed or fried vegetables. Vegetables in a cheese sauce. Regular canned vegetables. Regular canned tomato sauce and paste. Regular tomato and vegetable juices. Fruits Dried fruits. Canned fruit in light or heavy syrup. Fruit juice. Meat and Other Protein Products Fatty cuts of meat. Ribs, chicken wings, bacon, sausage, bologna, salami, chitterlings, fatback, hot dogs, bratwurst, and packaged luncheon meats. Salted nuts and seeds. Canned beans with salt. Dairy Whole or 2% milk, cream, half-and-half, and cream cheese. Whole-fat or sweetened yogurt. Full-fat cheeses or blue cheese. Nondairy creamers and whipped toppings. Processed cheese, cheese spreads, or cheese curds. Condiments Onion and garlic salt, seasoned salt, table salt, and sea salt. Canned and packaged gravies. Worcestershire sauce. Tartar sauce. Barbecue sauce. Teriyaki sauce. Soy sauce, including reduced sodium. Steak sauce. Fish sauce. Oyster sauce. Cocktail sauce. Horseradish. Ketchup and mustard. Meat flavorings and tenderizers. Bouillon cubes. Hot sauce. Tabasco sauce. Marinades. Taco seasonings. Relishes. Fats and Oils Butter, stick margarine, lard, shortening, ghee, and bacon fat. Coconut, palm kernel, or palm oils. Regular salad dressings. Other Pickles and olives. Salted popcorn and pretzels. The items listed above may not be a complete list of foods and beverages to avoid. Contact your dietitian for more information. WHERE CAN I FIND MORE INFORMATION? National Heart, Lung, and Blood Institute: travelstabloid.com Document Released: 07/27/2011 Document Revised: 12/22/2013 Document Reviewed: 06/11/2013 [facility] Patient Information 2015  [place], [place]. This information is not intended to replace advice given to you by your health care provider. Make sure you discuss any questions you have with your health care provider.

## 2014-11-09 NOTE — Progress Notes (Signed)
Patient ID: Savahna Casados, female   DOB: 1961-05-18, 54 y.o.   MRN: [id number]     Subjective:   Patient ID: Alexarae Oliva female    DOB: [birth year] 54 y.o.    MRN: [id number] Health Maintenance Due: Health Maintenance Due  Topic Date Due  . HIV Screening  10/17/1975  . PAP SMEAR  10/16/1978  . MAMMOGRAM  10/16/2010  . COLONOSCOPY  10/16/2010    _________________________________________________  HPI: Ms.Paulette Oran is a 54 y.o. female here for a routine visit.  Pt has a PMH outlined below.  Please see problem-based charting assessment and plan note for further details of medical issues addressed at today's visit.  PMH: Past Medical History  Diagnosis Date  . Hypertension   . Venous stasis ulcers   . Diastolic heart failure   . Hypokalemia   . Dyslipidemia     pt denies this hx on 05/12/2013  . Anemia   . CHF (congestive heart failure)   . Chronic bronchitis     "probably once/yr" (05/12/2013)  . Exertional shortness of breath   . GERD (gastroesophageal reflux disease)     Medications: Current Outpatient Prescriptions on File Prior to Visit  Medication Sig Dispense Refill  . aspirin EC 81 MG EC tablet Take 1 tablet (81 mg total) by mouth daily. 30 tablet 3   No current facility-administered medications on file prior to visit.    Allergies: No Known Allergies  FH: No family history on file.  SH: History   Social History  . Marital Status: Single    Spouse Name: N/A  . Number of Children: N/A  . Years of Education: N/A   Social History Main Topics  . Smoking status: Current Every Day Smoker -- 0.50 packs/day for 36 years    Types: Cigarettes  . Smokeless tobacco: Never Used  . Alcohol Use: 4.2 oz/week    7 Cans of beer per week     Comment: Beer once a week.  . Drug Use: No     Comment: 05/12/2013 "tried it a few days ago; didn't like it"  . Sexual Activity: Not on file   Other Topics Concern  . Not on file   Social History Narrative  .  No narrative on file    Review of Systems: Constitutional: Negative for fever, chills and weight loss.  Eyes: Negative for blurred vision.  Respiratory: +cough and -shortness of breath.  Cardiovascular: Negative for chest pain, palpitations and leg swelling.  Gastrointestinal: Negative for nausea, vomiting, abdominal pain, diarrhea, constipation and blood in stool.  Genitourinary: Negative for dysuria, urgency and frequency.  Musculoskeletal: Negative for myalgias and back pain.  Neurological: Negative for dizziness, weakness and headaches.     Objective:   Vital Signs: Filed Vitals:   11/09/14 1615  BP: 211/112  Pulse: 89  Temp: 98.6 F (37 C)  TempSrc: Oral  SpO2: 99%      BP Readings from Last 3 Encounters:  11/09/14 211/112  11/17/13 195/110  06/13/13 178/119    Physical Exam: Constitutional: Vital signs reviewed.  Patient is in NAD and cooperative with exam.  Head: Normocephalic and atraumatic. Eyes: PERRL, EOMI, conjunctivae nl, no scleral icterus.  Neck: Supple. Cardiovascular: RRR, no MRG. Pulmonary/Chest: normal effort, CTAB, no wheezes, rales, or rhonchi. Abdominal: Soft. NT/ND +BS. Neurological: A&O x3, cranial nerves II-XII are grossly intact, moving all extremities. Extremities: 2+DP b/l; trace-1+pitting edema. Skin: Warm, dry and intact. LE b/l chronic venous stasis.     Assessment &  Plan:   Assessment and plan was discussed and formulated with my attending.

## 2014-11-11 ENCOUNTER — Encounter: Payer: Self-pay | Admitting: Internal Medicine

## 2014-11-11 DIAGNOSIS — Z0000 Encounter for general adult medical examination without abnormal findings: Secondary | ICD-10-CM | POA: Insufficient documentation

## 2014-11-11 NOTE — Assessment & Plan Note (Signed)
-  mammogram ordered (hopefully she can qualify for the scholarship program) -requested she come in to see Bonna Gains to reapply for orange card -will get colonoscopy soon (will ask Mamie to send her hemoccult cards)

## 2014-11-11 NOTE — Progress Notes (Signed)
Case discussed with Dr. Gill soon after the resident saw the patient.  We reviewed the resident's history and exam and pertinent patient test results.  I agree with the assessment, diagnosis, and plan of care documented in the resident's note. 

## 2014-11-11 NOTE — Assessment & Plan Note (Signed)
-  continue to control HTN

## 2014-11-11 NOTE — Assessment & Plan Note (Signed)
Pt has cough likely related to smoking.  Discussed smoking cessation in detail and she states "I've cut back."  At this time, she appears unwilling to quit.   -continue to encourage cessation -today's visit, I am mostly concerned with lowering her blood pressure

## 2014-11-11 NOTE — Assessment & Plan Note (Signed)
BP Readings from Last 3 Encounters:  11/09/14 211/112  11/17/13 195/110  06/13/13 178/119    Lab Results  Component Value Date   NA 139 11/17/2013   K 3.3* 11/17/2013   CREATININE 0.97 11/17/2013    Assessment: Blood pressure control: severely elevated Progress toward BP goal:  deteriorated Comments: pt states she has missed several days of lisinopril, denies excessive sodium intake-->still smoking  Plan: Medications:  d/c lisinopril and prinzide-->concerned for hyperkalemia given potassium sparing; will add zestoretic 20-12.5mg  and f/u in 1 week.  Would like to put on amlodipine but since pt has no insurance it may be difficult of her to afford; however, [name] called [facility] and she can receive amlodipine for around $7-8 with the GoodRx card which should be given to her at her next OV in 1 week.  Would start on amlodipine at next OV if BP still above goal which I suspect that it will be.  I am trying to minimize the amount of pills as well as the cost in [name]'s regimen.  Would definitely not put on clonidine.  Also could consider spironolactone after trying amlodipine.   Educational resources provided:  discussed the effects of HTN on the heart, brain, kidneys, etc.  Other plans: limit sodium to 2000mg /d

## 2014-12-17 ENCOUNTER — Emergency Department (HOSPITAL_COMMUNITY): Payer: Self-pay

## 2014-12-17 ENCOUNTER — Encounter (HOSPITAL_COMMUNITY): Payer: Self-pay | Admitting: Emergency Medicine

## 2014-12-17 ENCOUNTER — Emergency Department (HOSPITAL_COMMUNITY)
Admission: EM | Admit: 2014-12-17 | Discharge: 2014-12-17 | Disposition: A | Discharge status: Home / Self Care | Payer: Self-pay | Attending: Emergency Medicine | Admitting: Emergency Medicine

## 2014-12-17 DIAGNOSIS — R0602 Shortness of breath: Secondary | ICD-10-CM

## 2014-12-17 DIAGNOSIS — Z8719 Personal history of other diseases of the digestive system: Secondary | ICD-10-CM | POA: Insufficient documentation

## 2014-12-17 DIAGNOSIS — J441 Chronic obstructive pulmonary disease with (acute) exacerbation: Secondary | ICD-10-CM | POA: Insufficient documentation

## 2014-12-17 DIAGNOSIS — R079 Chest pain, unspecified: Secondary | ICD-10-CM | POA: Insufficient documentation

## 2014-12-17 DIAGNOSIS — I5030 Unspecified diastolic (congestive) heart failure: Secondary | ICD-10-CM | POA: Insufficient documentation

## 2014-12-17 DIAGNOSIS — Z8639 Personal history of other endocrine, nutritional and metabolic disease: Secondary | ICD-10-CM | POA: Insufficient documentation

## 2014-12-17 DIAGNOSIS — Z720 Tobacco use: Secondary | ICD-10-CM | POA: Insufficient documentation

## 2014-12-17 DIAGNOSIS — R911 Solitary pulmonary nodule: Secondary | ICD-10-CM | POA: Insufficient documentation

## 2014-12-17 DIAGNOSIS — I160 Hypertensive urgency: Secondary | ICD-10-CM

## 2014-12-17 DIAGNOSIS — I10 Essential (primary) hypertension: Secondary | ICD-10-CM | POA: Insufficient documentation

## 2014-12-17 DIAGNOSIS — Z79899 Other long term (current) drug therapy: Secondary | ICD-10-CM | POA: Insufficient documentation

## 2014-12-17 DIAGNOSIS — Z862 Personal history of diseases of the blood and blood-forming organs and certain disorders involving the immune mechanism: Secondary | ICD-10-CM | POA: Insufficient documentation

## 2014-12-17 DIAGNOSIS — Z7982 Long term (current) use of aspirin: Secondary | ICD-10-CM | POA: Insufficient documentation

## 2014-12-17 LAB — CBC WITH DIFFERENTIAL/PLATELET
BASOS ABS: 0 10*3/uL (ref 0.0–0.1)
BASOS PCT: 0 % (ref 0–1)
Eosinophils Absolute: 0.1 10*3/uL (ref 0.0–0.7)
Eosinophils Relative: 2 % (ref 0–5)
HEMATOCRIT: 37.3 % (ref 36.0–46.0)
HEMOGLOBIN: 11.4 g/dL — AB (ref 12.0–15.0)
LYMPHS ABS: 2.1 10*3/uL (ref 0.7–4.0)
Lymphocytes Relative: 35 % (ref 12–46)
MCH: 27.1 pg (ref 26.0–34.0)
MCHC: 30.6 g/dL (ref 30.0–36.0)
MCV: 88.8 fL (ref 78.0–100.0)
Monocytes Absolute: 0.3 10*3/uL (ref 0.1–1.0)
Monocytes Relative: 5 % (ref 3–12)
Neutro Abs: 3.4 10*3/uL (ref 1.7–7.7)
Neutrophils Relative %: 58 % (ref 43–77)
Platelets: 278 10*3/uL (ref 150–400)
RBC: 4.2 MIL/uL (ref 3.87–5.11)
RDW: 13.1 % (ref 11.5–15.5)
WBC: 5.9 10*3/uL (ref 4.0–10.5)

## 2014-12-17 LAB — BASIC METABOLIC PANEL
Anion gap: 8 (ref 5–15)
BUN: 12 mg/dL (ref 6–23)
CO2: 30 mmol/L (ref 19–32)
CREATININE: 1.06 mg/dL (ref 0.50–1.10)
Calcium: 8.9 mg/dL (ref 8.4–10.5)
Chloride: 103 mmol/L (ref 96–112)
GFR calc non Af Amer: 58 mL/min — ABNORMAL LOW (ref >90)
GFR, EST AFRICAN AMERICAN: 68 mL/min — AB (ref >90)
Glucose, Bld: 97 mg/dL (ref 70–99)
Potassium: 3.1 mmol/L — ABNORMAL LOW (ref 3.5–5.1)
Sodium: 141 mmol/L (ref 135–145)

## 2014-12-17 LAB — I-STAT TROPONIN, ED
Troponin i, poc: 0.01 ng/mL (ref 0.00–0.08)
Troponin i, poc: 0.02 ng/mL (ref 0.00–0.08)

## 2014-12-17 LAB — BRAIN NATRIURETIC PEPTIDE: B NATRIURETIC PEPTIDE 5: 141.8 pg/mL — AB (ref 0.0–100.0)

## 2014-12-17 LAB — D-DIMER, QUANTITATIVE: D-Dimer, Quant: <0.27 ug/mL-FEU (ref 0.00–0.48)

## 2014-12-17 MED ORDER — NITROGLYCERIN 0.4 MG SL SUBL
0.4000 mg | SUBLINGUAL_TABLET | SUBLINGUAL | Status: DC | PRN
  Administered 2014-12-17 (×2): 0.4 mg via SUBLINGUAL
  Filled 2014-12-17: qty 1

## 2014-12-17 MED ORDER — ALBUTEROL SULFATE HFA 108 (90 BASE) MCG/ACT IN AERS
2.0000 | INHALATION_SPRAY | RESPIRATORY_TRACT | Status: DC | PRN
  Administered 2014-12-17: 2 via RESPIRATORY_TRACT
  Filled 2014-12-17: qty 6.7

## 2014-12-17 MED ORDER — IPRATROPIUM-ALBUTEROL 0.5-2.5 (3) MG/3ML IN SOLN
3.0000 mL | Freq: Once | RESPIRATORY_TRACT | Status: AC
  Administered 2014-12-17: 3 mL via RESPIRATORY_TRACT
  Filled 2014-12-17: qty 3

## 2014-12-17 MED ORDER — AMLODIPINE BESYLATE 10 MG PO TABS: 10.0000 mg | ORAL_TABLET | Freq: Every day | ORAL | Status: DC

## 2014-12-17 MED ORDER — AMLODIPINE BESYLATE 5 MG PO TABS
5.0000 mg | ORAL_TABLET | Freq: Once | ORAL | Status: AC
  Administered 2014-12-17: 5 mg via ORAL
  Filled 2014-12-17: qty 1

## 2014-12-17 MED ORDER — ONDANSETRON HCL 4 MG/2ML IJ SOLN
INTRAMUSCULAR | Status: AC
  Filled 2014-12-17: qty 2

## 2014-12-17 MED ORDER — ONDANSETRON HCL 4 MG/2ML IJ SOLN
4.0000 mg | Freq: Once | INTRAMUSCULAR | Status: AC
  Administered 2014-12-17: 4 mg via INTRAVENOUS

## 2014-12-17 MED ORDER — MORPHINE SULFATE 4 MG/ML IJ SOLN
4.0000 mg | Freq: Once | INTRAMUSCULAR | Status: AC
  Administered 2014-12-17: 4 mg via INTRAVENOUS
  Filled 2014-12-17: qty 1

## 2014-12-17 MED ORDER — ASPIRIN 325 MG PO TABS
325.0000 mg | ORAL_TABLET | Freq: Once | ORAL | Status: AC
  Administered 2014-12-17: 325 mg via ORAL
  Filled 2014-12-17: qty 1

## 2014-12-17 MED ORDER — CLONIDINE HCL 0.2 MG PO TABS: 0.2000 mg | ORAL_TABLET | Freq: Once | ORAL | Status: DC

## 2014-12-17 NOTE — Discharge Instructions (Signed)

## 2014-12-17 NOTE — ED Provider Notes (Signed)
CSN: [id number]     Arrival date & time 12/17/14  1549 History   First MD Initiated Contact with Patient 12/17/14 1728     Chief Complaint  Patient presents with  . Chest Pain     (Consider location/radiation/quality/duration/timing/severity/associated sxs/prior Treatment) HPI  Pt is a 54yo female with hx of HTN, venous stasis ulcers, CHF-diastolic heart failure, hypokalemia, dyslipidemia, anemia, exertional SOB, COPD, and GERD, presenting to ED with c/o persistent Left sided chest pain with associated cough and congestion as well as SOB, worse on exertion. Pt reports having a dry persistent cough for 2-3 weeks, left sided chest pain has been constant for last 2-3 days but SOB started yesterday after pt got home from a funeral in [place].  SOB gradually worsening.  Pt also reports recent change in her BP medication by her PCP.  Per medical records, pt is to be taking zestoretic 20-12.5mg  (lisinopril-hydrochlorothiazide) and amlodipine. Pt reports taking lisinopril and zestoretic but is not taking amlodipine as she called [facility] and they did not have the prescription. Per medical records, prescription was called into [facility].    Past Medical History  Diagnosis Date  . Hypertension   . Venous stasis ulcers   . Diastolic heart failure   . Hypokalemia   . Dyslipidemia     pt denies this hx on 05/12/2013  . Anemia   . CHF (congestive heart failure)   . Chronic bronchitis     "probably once/yr" (05/12/2013)  . Exertional shortness of breath   . GERD (gastroesophageal reflux disease)    Past Surgical History  Procedure Laterality Date  . Vaginal hysterectomy  1990's   History reviewed. No pertinent family history. History  Substance Use Topics  . Smoking status: Current Every Day Smoker -- 0.50 packs/day for 36 years    Types: Cigarettes  . Smokeless tobacco: Never Used  . Alcohol Use: 4.2 oz/week    7 Cans of beer per week     Comment: Beer once a week.   OB History    No data  available     Review of Systems  Constitutional: Negative for fever and chills.  Respiratory: Positive for cough ( dry) and shortness of breath.   Cardiovascular: Positive for chest pain ( left sided). Negative for palpitations and leg swelling.  Gastrointestinal: Negative for nausea, vomiting, abdominal pain and diarrhea.  All other systems reviewed and are negative.     Allergies  Review of patient's allergies indicates no known allergies.  Home Medications   Prior to Admission medications   Medication Sig Start Date End Date Taking? Authorizing Provider  aspirin EC 81 MG EC tablet Take 1 tablet (81 mg total) by mouth daily. 06/13/13  Yes Dorian Heckle, MD  lisinopril-hydrochlorothiazide (ZESTORETIC) 20-12.5 MG per tablet Take 2 tablets by mouth daily. 11/09/14  Yes Jones Bales, MD  amLODipine (NORVASC) 10 MG tablet Take 1 tablet (10 mg total) by mouth daily. 12/17/14   Noland Fordyce, PA-C   BP 196/107 mmHg  Pulse 72  Temp(Src) 98.5 F (36.9 C) (Oral)  Resp 14  SpO2 97% Physical Exam  Constitutional: She appears well-developed and well-nourished. No distress.  HENT:  Head: Normocephalic and atraumatic.  Eyes: Conjunctivae are normal. No scleral icterus.  Neck: Normal range of motion. Neck supple.  Cardiovascular: Normal rate, regular rhythm and normal heart sounds.   Pulmonary/Chest: Effort normal. No respiratory distress. She has wheezes. She has no rales. She exhibits no tenderness.  Decreased breath sounds in lower  lung fields, expiratory wheeze throughout. Able to speak in full sentences. No accessory muscle use.   Abdominal: Soft. Bowel sounds are normal. She exhibits no distension and no mass. There is no tenderness. There is no rebound and no guarding.  Musculoskeletal: Normal range of motion.  Neurological: She is alert.  Skin: Skin is warm and dry. She is not diaphoretic.  Nursing note and vitals reviewed.   ED Course  Procedures (including critical care  time) Labs Review Labs Reviewed  BASIC METABOLIC PANEL - Abnormal; Notable for the following:    Potassium 3.1 (*)    GFR calc non Af Amer 58 (*)    GFR calc Af Amer 68 (*)    All other components within normal limits  BRAIN NATRIURETIC PEPTIDE - Abnormal; Notable for the following:    B Natriuretic Peptide 141.8 (*)    All other components within normal limits  CBC WITH DIFFERENTIAL/PLATELET - Abnormal; Notable for the following:    Hemoglobin 11.4 (*)    All other components within normal limits  D-DIMER, QUANTITATIVE  I-STAT TROPOININ, ED  Randolm Idol, ED    Imaging Review Dg Chest 2 View  12/17/2014   CLINICAL DATA:  54 year old female with chest pain an shortness of breath for the past week  EXAM: CHEST  2 VIEW  COMPARISON:  Prior chest x-ray 06/11/2013 ; prior chest CT 12/28/2010  FINDINGS: Stable cardiomegaly. Atherosclerotic and ectatic thoracic aorta. Background bronchitic change and mild interstitial prominence are also similar compared to prior. No focal airspace consolidation, pulmonary edema, pleural effusion or pneumothorax. Questionable 9 mm nodular opacity in the left upper lung which was not present on prior imaging. No acute osseous abnormality.  IMPRESSION: 1. No evidence of acute cardiopulmonary process. 2. Possible new 9 mm pulmonary nodule in the left upper lobe. Recommend further evaluation with non emergent CT scan of the chest. 3. Stable cardiomegaly and aortic atherosclerosis.   Electronically Signed   By: Jacqulynn Cadet M.D.   On: 12/17/2014 18:31     EKG Interpretation   Date/Time:  Thursday December 17 2014 15:59:59 EDT Ventricular Rate:  90 PR Interval:  202 QRS Duration: 96 QT Interval:  392 QTC Calculation: 479 R Axis:   3 Text Interpretation:  Normal sinus rhythm Normal ECG PR interval  normalized compared to prior. Confirmed by [facility]  MD, Lytle Michaels ([id number]) on  12/17/2014 7:40:37 PM      MDM   Final diagnoses:  Chest pain, unspecified chest  pain type  Shortness of breath  Nodule of left lung  Hypertensive urgency    Pt is a 54yo female with hx CHF, HTN, and COPD, presenting to ED with c/o left sided chest pain, SOB, and cough.  Pt did have expiratory wheeze on exam that resolved after 1 duoneb tx.    Troponin: negative. D-dimer: negative, doubt PE. Mild elevated BNP at 141, CXR: no evidence of acute cardiopulmonary process.  Doubt pt is having an exacerbation of CHF.    7:37 PM pt states chest pain is improving. BP improved to 188/106 but started to increased to 198/97.  Will give pt amlodipine 5mg , then reevaluate.   Discussed pt with Dr. Doy Mince who also examined pt. Agrees CP is most likely musculoskeletal in nature as pt has left sided chest wall tenderness.  Doubt ACS. Delta troponin: negative for elevation.  Normalized EKG compared to prior.  Pt safe for discharge home to f/u with PCP by next week for recheck of BP.  Will  discharge pt home with albuterol inhaler as cough believed to be due to COPD/asthma as wheeze was initially present upon arrival but resolved after duoneb tx.  Will also discharge pt home with amlodipine as per note from PCP, pt suppose to be on medication. Return precautions provided. Pt verbalized understanding and agreement with tx plan.    Noland Fordyce, PA-C 12/17/14 [id number]  Serita Grit, MD 12/19/14 [place], MD 12/19/14 [phone]

## 2014-12-17 NOTE — ED Notes (Addendum)
emisis x1, PA notified. Verbal order for zofran 4MG  IV x1. Offered pt change of gown d/t emesis, refused.

## 2014-12-17 NOTE — ED Notes (Signed)
Called phlebotomy in regards to i stat troponin. Updated PA-C, Junie Panning, they are coming to draw.

## 2014-12-17 NOTE — ED Notes (Signed)
Pt sts left sided CP with cough and congestion; pt noted to be htn with hx of same

## 2014-12-25 ENCOUNTER — Encounter: Payer: Self-pay | Admitting: Internal Medicine

## 2014-12-25 ENCOUNTER — Ambulatory Visit (INDEPENDENT_AMBULATORY_CARE_PROVIDER_SITE_OTHER): Payer: Self-pay | Admitting: Internal Medicine

## 2014-12-25 VITALS — BP 205/116 | HR 89 | Temp 98.9°F | Ht 68.0 in | Wt 239.0 lb

## 2014-12-25 DIAGNOSIS — Z9114 Patient's other noncompliance with medication regimen: Secondary | ICD-10-CM

## 2014-12-25 DIAGNOSIS — F17210 Nicotine dependence, cigarettes, uncomplicated: Secondary | ICD-10-CM

## 2014-12-25 DIAGNOSIS — D649 Anemia, unspecified: Secondary | ICD-10-CM | POA: Insufficient documentation

## 2014-12-25 DIAGNOSIS — I10 Essential (primary) hypertension: Secondary | ICD-10-CM

## 2014-12-25 DIAGNOSIS — Z720 Tobacco use: Secondary | ICD-10-CM

## 2014-12-25 DIAGNOSIS — R059 Cough, unspecified: Secondary | ICD-10-CM

## 2014-12-25 DIAGNOSIS — R911 Solitary pulmonary nodule: Secondary | ICD-10-CM | POA: Insufficient documentation

## 2014-12-25 DIAGNOSIS — R05 Cough: Secondary | ICD-10-CM

## 2014-12-25 MED ORDER — LOSARTAN POTASSIUM-HCTZ 100-25 MG PO TABS: 1.0000 | ORAL_TABLET | Freq: Every day | ORAL | Status: DC

## 2014-12-25 NOTE — Assessment & Plan Note (Signed)
Will need to put order for CT chest next visit after she gets her orange card.

## 2014-12-25 NOTE — Patient Instructions (Signed)
Stop taking lisinopril.  Start taking new medication LOSARTAN-HYDROCHLOROTHIAZIDE 1 tablet daily.  Also pick up your AMLODIPINE/norvasc 10mg  daily and take it once a day.  Follow up in 1 week.

## 2014-12-25 NOTE — Assessment & Plan Note (Signed)
Continues to smoke 1/2 ppd. Talked about quitting and explained risk of lung cancer.

## 2014-12-25 NOTE — Assessment & Plan Note (Signed)
BP remains elevated today as it was last few visit and also in the ED.  Filed Vitals:   12/25/14 1513  BP: 205/116  Pulse: 89  Temp: 98.9 F (37.2 C)   Supposed to be taking lisinopril-hctz 20-12.5mg  2 tabs once a day.  + ED also added amlodipine 10mg  daily which patient has not picked up.   She has been taking her lisinopril 20-12.69m only one tablet rather than two. She also is taking her lisinopril 40mg  daily old tablet in addition to this. Hasn't picked up her amlodipine.  Plan: switched aceI to Losartan-HCTZ 100-25mg  -one tablet daily  (asked her stop taking her lisinopril formulations)  Asked her to pick up amlodipine 10mg  daily   Come back in 1 week to recheck BP.

## 2014-12-25 NOTE — Assessment & Plan Note (Addendum)
Has coughing for 1 month, with some clear sputum. No fever/chills. Has some SOB and wheezing, which is improving with albuterol. Lung exam is clear currently.   Continues to smoke 0.5 ppd.   I suspect this is from her smoking and likely has COPD. Other factors include GERD (has hx of this on EMR but patient denies any symptoms - not on meds), also could be from lisinopril, and also has lung nodule on CXR.  - switch lisinopril-hctz to losartan-hctz- if cough does not improve with this, may consider a trial of PPI - continue albuterol PRN now. Consider ordering PFT when she has orange card likely has COPD.   - will need to put order to  get CT next visit can to evaluate her lung nodule in the future after she gets her orange card.

## 2014-12-25 NOTE — Assessment & Plan Note (Signed)
Never had csope. PCP wanted to get hemoccult. Will need csope in the future when gets her orange card.  Ordered home hemoccult card.

## 2014-12-25 NOTE — Progress Notes (Signed)
   Subjective:    Patient ID: [name], female    DOB: [birth year], 54 y.o.   MRN: [id number]  HPI  54 yo female with hx of HTN, GERd, HLD, here for ED f/up  Was seen in ED on 12/17/14 for chest pain, thought to be MSK chest pain.  Also had cough and SOB/wheezing that time. Felt better with 1x solumedrol. Was sent home with albuterol inhaler.   Today she feels her breathing is better with the albuterol but has been coughing for last 1 month. It's worse at night. No fever/chills. Has some clear sputum. No sore throat, rhinorrhea, n/v/ hemoptysis. Does have sweating throughout the day which she relates to menopause.  Continues to smoke 1/2 ppd.  Has hx of GERD on EMR but denies symptoms. Not on meds. Has lung nodule on CXR 37mm LUL. Needs f/up CT chest for this.   On lisinopril 20-12.5mg  once a day (supposed to take 2 tabs daily). She is also taking her old lisinopril 40mg , not sure why. Was told to take amlodipine 10mg  daily by ED for elevated BP but hasn't picked it up yet. BP very high today 205/116.   See problem based a&p.   Review of Systems  Constitutional: Negative for fever, chills and fatigue.  HENT: Negative for postnasal drip, sinus pressure and sore throat.   Eyes: Negative for discharge and visual disturbance.  Respiratory: Positive for cough and shortness of breath. Negative for choking, chest tightness, wheezing and stridor.   Cardiovascular: Negative for chest pain, palpitations and leg swelling.  Gastrointestinal: Negative for nausea, vomiting, diarrhea and abdominal distention.  Endocrine: Negative.   Genitourinary: Negative for dysuria and frequency.  Musculoskeletal: Negative.   Skin: Negative.   Allergic/Immunologic: Negative.   Neurological: Negative for syncope and weakness.  Hematological: Negative.   Psychiatric/Behavioral: Negative.        Objective:   Physical Exam  Constitutional: She is oriented to person, place, and time. She appears  well-developed and well-nourished. No distress.  Coughing several times during the exam bringing up clear sputum.  HENT:  Head: Normocephalic and atraumatic.  Mouth/Throat: Oropharynx is clear and moist. No oropharyngeal exudate.  Eyes: Conjunctivae are normal. Pupils are equal, round, and reactive to light. No scleral icterus.  Neck: No JVD present.  Cardiovascular: Normal rate, regular rhythm and normal heart sounds.  Exam reveals no gallop and no friction rub.   No murmur heard. Pulmonary/Chest: Effort normal and breath sounds normal. No respiratory distress. She has no wheezes. She has no rales. She exhibits no tenderness.  Abdominal: Soft. Bowel sounds are normal. She exhibits no distension and no mass. There is no rebound.  Musculoskeletal: Normal range of motion. She exhibits no edema or tenderness.  Neurological: She is alert and oriented to person, place, and time. No cranial nerve deficit. Coordination normal.  Skin: Skin is warm. She is not diaphoretic.    Filed Vitals:   12/25/14 1513  BP: 205/116  Pulse: 89  Temp: 98.9 F (37.2 C)       Assessment & Plan:  See problem based a&p.

## 2014-12-28 NOTE — Progress Notes (Signed)
INTERNAL MEDICINE TEACHING ATTENDING ADDENDUM - Joel Mericle, MD: I reviewed and discussed at the time of visit with the resident Dr. Ahmed, the patient's medical history, physical examination, diagnosis and results of pertinent tests and treatment and I agree with the patient's care as documented.  

## 2015-01-05 ENCOUNTER — Ambulatory Visit: Payer: Self-pay

## 2015-01-07 ENCOUNTER — Ambulatory Visit: Payer: Self-pay

## 2015-01-12 ENCOUNTER — Ambulatory Visit (INDEPENDENT_AMBULATORY_CARE_PROVIDER_SITE_OTHER): Payer: Self-pay | Admitting: Internal Medicine

## 2015-01-12 ENCOUNTER — Encounter: Payer: Self-pay | Admitting: Internal Medicine

## 2015-01-12 ENCOUNTER — Ambulatory Visit: Payer: Self-pay

## 2015-01-12 VITALS — BP 214/117 | HR 81 | Temp 98.7°F | Ht 68.0 in | Wt 243.5 lb

## 2015-01-12 DIAGNOSIS — R059 Cough, unspecified: Secondary | ICD-10-CM

## 2015-01-12 DIAGNOSIS — I10 Essential (primary) hypertension: Secondary | ICD-10-CM

## 2015-01-12 DIAGNOSIS — R05 Cough: Secondary | ICD-10-CM

## 2015-01-12 DIAGNOSIS — R911 Solitary pulmonary nodule: Secondary | ICD-10-CM

## 2015-01-12 DIAGNOSIS — F17200 Nicotine dependence, unspecified, uncomplicated: Secondary | ICD-10-CM

## 2015-01-12 MED ORDER — LOSARTAN POTASSIUM 50 MG PO TABS: 100.0000 mg | ORAL_TABLET | Freq: Once | ORAL | Status: DC

## 2015-01-12 MED ORDER — LOSARTAN POTASSIUM 100 MG PO TABS: 100.0000 mg | ORAL_TABLET | Freq: Once | ORAL | Status: DC

## 2015-01-12 MED ORDER — LOSARTAN POTASSIUM 25 MG PO TABS
100.0000 mg | ORAL_TABLET | Freq: Once | ORAL | Status: AC
  Administered 2015-01-12: 100 mg via ORAL

## 2015-01-12 NOTE — Patient Instructions (Addendum)
Please pick up your losartan-HCTZ tablets and start taking from tomorrow.  We need to get urgent CT scan of your chest as soon as you get your orange card application done to evaluate your lung nodule. There is risk of cancer so we need to evaluate this.

## 2015-01-12 NOTE — Assessment & Plan Note (Signed)
Cough has resolved after stopping lisinopril so it may be due to that. However, she is a smoker so this could also be 2/2 to COPD as she still has occasional SOB which improves with PRN albuterol.  Will add lisinopril to allergy list.  Will need PFT once has orange card.

## 2015-01-12 NOTE — Assessment & Plan Note (Signed)
BP remains elevated.  Filed Vitals:   01/12/15 1524  BP: 214/117  Pulse: 81  Temp: 98.7 F (37.1 C)    Has symptoms of headache and blurry vision likely from the elevated BP. I don't want to lower it too much as that would put her at risk of stroke as her BP has been elevated chronically.   She is taking amlodipine but could not afford the losartan-hctz (switched from lisinopril-hctz because of cough). I gave her a coupon to get her 30 day supply losartan-hctz for $12 at [facility]. Asked her to pick it up and start taking from tomorrow morning. Today in the clinic I gave her losartan 100mg  tablet once.   Asked her to go the ED if she has worsening of symptoms.  Follow up in 2 weeks for BP recheck.

## 2015-01-12 NOTE — Assessment & Plan Note (Addendum)
She needs chest CT to evaluate her lung nodule 9 mm seen on 12/17/2014 CXR on LUL. She is a smoker so is at higher risk of malignancy. I expressed the urgency of the CT to establish the baseline characteristic of the mass. May need PET depending on the CT findings.  She is still waiting to get her orange card application completed. I asked her that when she follows up in 2 weeks hopefully she will have her orange card and we can order the CT chest that time.

## 2015-01-12 NOTE — Progress Notes (Signed)
   Subjective:    Patient ID: [name], female    DOB: 1961-05-03, 54 y.o.   MRN: [id number]  HPI  54 yo female with uncontrolled HTN, tobacco abuse, lung nodule, and cough here for follow up of HTN.  Last visit I changed her Lisinopril-hctz to [facility] for cough of ~ 1 month. Her cough has resolved after discontinuing lisinopril. However, she has not been taking the losartan-hctz as she could not afford it. She gets occasional SOB for which she takes albuterol PRN.   She has headache. Her BP is elevated today. Has been taking amlodipine only for last 3 days, hasn't been taking losartan-hctz because of the price. Has some blurry vision as well. No chest pain, numbness, tingling or other symptoms.   Review of Systems  Constitutional: Negative for fever and chills.  HENT: Negative for congestion, sinus pressure and sore throat.   Eyes: Positive for visual disturbance. Negative for photophobia, pain and discharge.  Respiratory: Positive for shortness of breath. Negative for cough and chest tightness.   Cardiovascular: Negative for chest pain, palpitations and leg swelling.  Gastrointestinal: Negative for nausea, vomiting and diarrhea.  Genitourinary: Negative for dysuria.  Musculoskeletal: Negative.   Neurological: Positive for headaches. Negative for dizziness, seizures, weakness and numbness.  Psychiatric/Behavioral: Negative.        Objective:   Physical Exam  Constitutional: She is oriented to person, place, and time.  HENT:  Head: Normocephalic and atraumatic.  Mouth/Throat: Oropharynx is clear and moist.  Eyes: Conjunctivae and EOM are normal. Pupils are equal, round, and reactive to light.  Could not visualize the optic disc   Cardiovascular: Normal rate and regular rhythm.  Exam reveals no gallop and no friction rub.   Murmur heard. Systolic ejection murmur.  Pulmonary/Chest: Effort normal and breath sounds normal. No respiratory distress. She has no wheezes.  She has no rales. She exhibits no tenderness.  Abdominal: Soft. Bowel sounds are normal. She exhibits no distension. There is no tenderness.  Musculoskeletal: Normal range of motion. She exhibits no edema or tenderness.  Neurological: She is alert and oriented to person, place, and time. No cranial nerve deficit.    Filed Vitals:   01/12/15 1524  BP: 214/117  Pulse: 81  Temp: 98.7 F (37.1 C)        Assessment & Plan:  See problem based a&P.

## 2015-01-14 NOTE — Progress Notes (Signed)
Case discussed with Dr. Genene Churn at time of visit. We reviewed the resident's history and exam and pertinent patient test results. I agree with the assessment, diagnosis, and plan of care documented in the resident's note.  ACEI associated cough has resolved clinical with discontinuation of ACEI.  Can not afford ARB.  Was given a short term solution to the problem with a coupon, at follow-up we will see if she has received the [facility] and then adjust antihypertensives appropriately based upon affordability for the patient.  This may require a change in therapy to a more affordable agent on her plan's formulary.  The importance of getting the [place] paperwork completed ASAP stressed given the need for further imaging of the lung nodule.

## 2015-01-19 ENCOUNTER — Other Ambulatory Visit: Payer: Self-pay | Admitting: *Deleted

## 2015-01-19 DIAGNOSIS — I10 Essential (primary) hypertension: Secondary | ICD-10-CM

## 2015-01-19 MED ORDER — LOSARTAN POTASSIUM-HCTZ 100-25 MG PO TABS: 1.0000 | ORAL_TABLET | Freq: Every day | ORAL | Status: DC

## 2015-01-19 NOTE — Telephone Encounter (Signed)
Ms. Keisler needs to come in for a BP recheck ASAP.  Will send message to front desk to schedule.

## 2015-01-21 ENCOUNTER — Telehealth: Payer: Self-pay | Admitting: Pharmacist

## 2015-01-21 NOTE — Telephone Encounter (Addendum)
Contacted patient for HTN medication assistance. Medications (amlodipine + losartan/HCTZ) were reviewed with the patient, including name, instructions, indication, goals of therapy, potential side effects, importance of adherence, and safe use.  Patient states she did not realize Rx losartan-HCTZ was sent to pharmacy. Educated patient and advised her to pick up Rx. Patient verbalized understanding and reports no assistance needed at this time.

## 2015-02-01 ENCOUNTER — Encounter: Payer: Self-pay | Admitting: Internal Medicine

## 2015-02-26 ENCOUNTER — Telehealth: Payer: Self-pay | Admitting: Internal Medicine

## 2015-02-26 NOTE — Telephone Encounter (Signed)
Call to patient to confirm appointment for 03/01/15 at 3:45 lmtcb

## 2015-02-28 ENCOUNTER — Encounter: Payer: Self-pay | Admitting: Internal Medicine

## 2015-03-01 ENCOUNTER — Ambulatory Visit (INDEPENDENT_AMBULATORY_CARE_PROVIDER_SITE_OTHER): Payer: Self-pay | Admitting: Internal Medicine

## 2015-03-01 ENCOUNTER — Encounter: Payer: Self-pay | Admitting: Internal Medicine

## 2015-03-01 VITALS — BP 191/98 | HR 70 | Temp 98.0°F | Ht 68.0 in | Wt 252.2 lb

## 2015-03-01 DIAGNOSIS — F17210 Nicotine dependence, cigarettes, uncomplicated: Secondary | ICD-10-CM

## 2015-03-01 DIAGNOSIS — I10 Essential (primary) hypertension: Secondary | ICD-10-CM

## 2015-03-01 DIAGNOSIS — R911 Solitary pulmonary nodule: Secondary | ICD-10-CM

## 2015-03-01 DIAGNOSIS — Z720 Tobacco use: Secondary | ICD-10-CM

## 2015-03-01 DIAGNOSIS — R05 Cough: Secondary | ICD-10-CM

## 2015-03-01 DIAGNOSIS — R059 Cough, unspecified: Secondary | ICD-10-CM

## 2015-03-01 NOTE — Progress Notes (Signed)
Subjective:   Patient ID: [name] female    DOB: [birth year] 54 y.o.    MRN: [id number] Health Maintenance Due: Health Maintenance Due  Topic Date Due  . HIV Screening  10/17/1975  . MAMMOGRAM  10/16/2010  . COLONOSCOPY  10/16/2010    _________________________________________________  HPI: [name] is a 54 y.o. female here for a routine visit. Pt has a PMH outlined below.  Please see problem-based charting assessment and plan note for further details of medical issues addressed at today's visit.  PMH: Past Medical History  Diagnosis Date  . Hypertension   . Venous stasis ulcers   . Diastolic heart failure   . Hypokalemia   . Dyslipidemia     pt denies this hx on 05/12/2013  . Anemia   . CHF (congestive heart failure)   . Chronic bronchitis     "probably once/yr" (05/12/2013)  . Exertional shortness of breath   . GERD (gastroesophageal reflux disease)     Medications: Current Outpatient Prescriptions on File Prior to Visit  Medication Sig Dispense Refill  . amLODipine (NORVASC) 10 MG tablet Take 1 tablet (10 mg total) by mouth daily. 30 tablet 0  . aspirin EC 81 MG EC tablet Take 1 tablet (81 mg total) by mouth daily. 30 tablet 3  . losartan-hydrochlorothiazide (HYZAAR) 100-25 MG per tablet Take 1 tablet by mouth daily. 90 tablet 0   Current Facility-Administered Medications on File Prior to Visit  Medication Dose Route Frequency Provider Last Rate Last Dose  . losartan (COZAAR) tablet 100 mg  100 mg Oral Once Dellia Nims, MD        Allergies: Allergies  Allergen Reactions  . Lisinopril Cough    FH: No family history on file.  SH: History   Social History  . Marital Status: Single    Spouse Name: N/A  . Number of Children: N/A  . Years of Education: N/A   Social History Main Topics  . Smoking status: Current Every Day Smoker -- 0.50 packs/day for 36 years    Types: Cigarettes  . Smokeless tobacco: Never Used     Comment: trying  to cut back.  . Alcohol Use: 4.2 oz/week    7 Cans of beer per week     Comment: Beer once a week.  . Drug Use: No     Comment: 05/12/2013 "tried it a few days ago; didn't like it"  . Sexual Activity: Not on file   Other Topics Concern  . None   Social History Narrative    Review of Systems: Constitutional: Negative for fever, chills and weight loss.  Eyes: Negative for blurred vision.  Respiratory: Negative for cough and shortness of breath.  Cardiovascular: Negative for chest pain, palpitations and leg swelling.  Gastrointestinal: Negative for nausea, vomiting, abdominal pain, diarrhea, constipation and blood in stool.  Genitourinary: Negative for dysuria, urgency and frequency.  Musculoskeletal: Negative for myalgias and back pain.  Neurological: Negative for dizziness, weakness and headaches.     Objective:   Vital Signs: Filed Vitals:   03/01/15 1647  BP: 191/98  Pulse: 70  Temp: 98 F (36.7 C)  TempSrc: Oral  Height: 5\' 8"  (1.727 m)  Weight: 252 lb 3.2 oz (114.397 kg)  SpO2: 100%      BP Readings from Last 3 Encounters:  03/01/15 191/98  01/12/15 214/117  12/25/14 205/116    Physical Exam: Constitutional: Vital signs reviewed.  Patient is in NAD and cooperative with exam.  Head:  Normocephalic and atraumatic. Eyes: EOMI, conjunctivae nl, no scleral icterus.  Neck: Supple. Cardiovascular: RRR, no MRG. Pulmonary/Chest: normal effort, CTAB, no wheezes, rales, or rhonchi. Abdominal: Soft. NT/ND +BS. Neurological: A&O x3, cranial nerves II-XII are grossly intact, moving all extremities. Extremities: Wrapped with ACE bandage b/l.     Assessment & Plan:   Assessment and plan was discussed and formulated with my attending.

## 2015-03-01 NOTE — Patient Instructions (Signed)
Thank you for your visit today.   Please return to the internal medicine clinic in about 2 weeks to recheck your blood pressure.      Your current medical regimen is effective;  continue present plan and take all medications as prescribed.  Please pick up your hyzaar today.    Please try to quit smoking as this will help your blood pressure.  Also try to decrease your salt intake.    Be sure to get your paperwork in for the orange card.   Please be sure to bring all of your medications with you to every visit; this includes herbal supplements, vitamins, eye drops, and any over-the-counter medications.   Should you have any questions regarding your medications and/or any new or worsening symptoms, please be sure to call the clinic at [phone].   If you believe that you are suffering from a life threatening condition or one that may result in the loss of limb or function, then you should call 911 or proceed to the nearest Emergency Department.     A healthy lifestyle and preventative care can promote health and wellness.   Maintain regular health, dental, and eye exams.  Eat a healthy diet. Foods like vegetables, fruits, whole grains, low-fat dairy products, and lean protein foods contain the nutrients you need without too many calories. Decrease your intake of foods high in solid fats, added sugars, and salt. Get information about a proper diet from your caregiver, if necessary.  Regular physical exercise is one of the most important things you can do for your health. Most adults should get at least 150 minutes of moderate-intensity exercise (any activity that increases your heart rate and causes you to sweat) each week. In addition, most adults need muscle-strengthening exercises on 2 or more days a week.   Maintain a healthy weight. The body mass index (BMI) is a screening tool to identify possible weight problems. It provides an estimate of body fat based on height and weight.  Your caregiver can help determine your BMI, and can help you achieve or maintain a healthy weight. For adults 20 years and older:  A BMI below 18.5 is considered underweight.  A BMI of 18.5 to 24.9 is normal.  A BMI of 25 to 29.9 is considered overweight.  A BMI of 30 and above is considered obese. DASH Eating Plan DASH stands for "Dietary Approaches to Stop Hypertension." The DASH eating plan is a healthy eating plan that has been shown to reduce high blood pressure (hypertension). Additional health benefits may include reducing the risk of type 2 diabetes mellitus, heart disease, and stroke. The DASH eating plan may also help with weight loss. WHAT DO I NEED TO KNOW ABOUT THE DASH EATING PLAN? For the DASH eating plan, you will follow these general guidelines:  Choose foods with a percent daily value for sodium of less than 5% (as listed on the food label).  Use salt-free seasonings or herbs instead of table salt or sea salt.  Check with your health care Dorice Stiggers or pharmacist before using salt substitutes.  Eat lower-sodium products, often labeled as "lower sodium" or "no salt added."  Eat fresh foods.  Eat more vegetables, fruits, and low-fat dairy products.  Choose whole grains. Look for the word "whole" as the first word in the ingredient list.  Choose fish and skinless chicken or [place] more often than red meat. Limit fish, poultry, and meat to 6 oz (170 g) each day.  Limit sweets, desserts,  sugars, and sugary drinks.  Choose heart-healthy fats.  Limit cheese to 1 oz (28 g) per day.  Eat more home-cooked food and less restaurant, buffet, and fast food.  Limit fried foods.  Cook foods using methods other than frying.  Limit canned vegetables. If you do use them, rinse them well to decrease the sodium.  When eating at a restaurant, ask that your food be prepared with less salt, or no salt if possible. WHAT FOODS CAN I EAT? Seek help from a dietitian for individual  calorie needs. Grains Whole grain or whole wheat bread. Brown rice. Whole grain or whole wheat pasta. Quinoa, bulgur, and whole grain cereals. Low-sodium cereals. Corn or whole wheat flour tortillas. Whole grain cornbread. Whole grain crackers. Low-sodium crackers. Vegetables Fresh or frozen vegetables (raw, steamed, roasted, or grilled). Low-sodium or reduced-sodium tomato and vegetable juices. Low-sodium or reduced-sodium tomato sauce and paste. Low-sodium or reduced-sodium canned vegetables.  Fruits All fresh, canned (in natural juice), or frozen fruits. Meat and Other Protein Products Ground beef (85% or leaner), grass-fed beef, or beef trimmed of fat. Skinless chicken or [place]. Ground chicken or [place]. Pork trimmed of fat. All fish and seafood. Eggs. Dried beans, peas, or lentils. Unsalted nuts and seeds. Unsalted canned beans. Dairy Low-fat dairy products, such as skim or 1% milk, 2% or reduced-fat cheeses, low-fat ricotta or cottage cheese, or plain low-fat yogurt. Low-sodium or reduced-sodium cheeses. Fats and Oils Tub margarines without trans fats. Light or reduced-fat mayonnaise and salad dressings (reduced sodium). Avocado. Safflower, olive, or canola oils. Natural peanut or almond butter. Other Unsalted popcorn and pretzels. The items listed above may not be a complete list of recommended foods or beverages. Contact your dietitian for more options. WHAT FOODS ARE NOT RECOMMENDED? Grains White bread. White pasta. White rice. Refined cornbread. Bagels and croissants. Crackers that contain trans fat. Vegetables Creamed or fried vegetables. Vegetables in a cheese sauce. Regular canned vegetables. Regular canned tomato sauce and paste. Regular tomato and vegetable juices. Fruits Dried fruits. Canned fruit in light or heavy syrup. Fruit juice. Meat and Other Protein Products Fatty cuts of meat. Ribs, chicken wings, bacon, sausage, bologna, salami, chitterlings, fatback, hot dogs,  bratwurst, and packaged luncheon meats. Salted nuts and seeds. Canned beans with salt. Dairy Whole or 2% milk, cream, half-and-half, and cream cheese. Whole-fat or sweetened yogurt. Full-fat cheeses or blue cheese. Nondairy creamers and whipped toppings. Processed cheese, cheese spreads, or cheese curds. Condiments Onion and garlic salt, seasoned salt, table salt, and sea salt. Canned and packaged gravies. Worcestershire sauce. Tartar sauce. Barbecue sauce. Teriyaki sauce. Soy sauce, including reduced sodium. Steak sauce. Fish sauce. Oyster sauce. Cocktail sauce. Horseradish. Ketchup and mustard. Meat flavorings and tenderizers. Bouillon cubes. Hot sauce. Tabasco sauce. Marinades. Taco seasonings. Relishes. Fats and Oils Butter, stick margarine, lard, shortening, ghee, and bacon fat. Coconut, palm kernel, or palm oils. Regular salad dressings. Other Pickles and olives. Salted popcorn and pretzels. The items listed above may not be a complete list of foods and beverages to avoid. Contact your dietitian for more information. WHERE CAN I FIND MORE INFORMATION? National Heart, Lung, and Blood Institute: travelstabloid.com Document Released: 07/27/2011 Document Revised: 12/22/2013 Document Reviewed: 06/11/2013 [facility] Patient Information 2015 [place], [place]. This information is not intended to replace advice given to you by your health care Rhodesia Stanger. Make sure you discuss any questions you have with your health care Jeneane Pieczynski.  Managing Your High Blood Pressure Blood pressure is a measurement of how forceful your blood is  pressing against the walls of the arteries. Arteries are muscular tubes within the circulatory system. Blood pressure does not stay the same. Blood pressure rises when you are active, excited, or nervous; and it lowers during sleep and relaxation. If the numbers measuring your blood pressure stay above normal most of the time, you are at risk for health  problems. High blood pressure (hypertension) is a long-term (chronic) condition in which blood pressure is elevated. A blood pressure reading is recorded as two numbers, such as 120 over 80 (or 120/80). The first, higher number is called the systolic pressure. It is a measure of the pressure in your arteries as the heart beats. The second, lower number is called the diastolic pressure. It is a measure of the pressure in your arteries as the heart relaxes between beats.  Keeping your blood pressure in a normal range is important to your overall health and prevention of health problems, such as heart disease and stroke. When your blood pressure is uncontrolled, your heart has to work harder than normal. High blood pressure is a very common condition in adults because blood pressure tends to rise with age. Men and women are equally likely to have hypertension but at different times in life. Before age 102, men are more likely to have hypertension. After 54 years of age, women are more likely to have it. Hypertension is especially common in African Americans. This condition often has no signs or symptoms. The cause of the condition is usually not known. Your caregiver can help you come up with a plan to keep your blood pressure in a normal, healthy range. BLOOD PRESSURE STAGES Blood pressure is classified into four stages: normal, prehypertension, stage 1, and stage 2. Your blood pressure reading will be used to determine what type of treatment, if any, is necessary. Appropriate treatment options are tied to these four stages:  Normal  Systolic pressure (mm Hg): below 120.  Diastolic pressure (mm Hg): below 80. Prehypertension  Systolic pressure (mm Hg): 120 to 139.  Diastolic pressure (mm Hg): 80 to 89. Stage1  Systolic pressure (mm Hg): 140 to 159.  Diastolic pressure (mm Hg): 90 to 99. Stage2  Systolic pressure (mm Hg): 160 or above.  Diastolic pressure (mm Hg): 100 or above. RISKS RELATED  TO HIGH BLOOD PRESSURE Managing your blood pressure is an important responsibility. Uncontrolled high blood pressure can lead to:  A heart attack.  A stroke.  A weakened blood vessel (aneurysm).  Heart failure.  Kidney damage.  Eye damage.  Metabolic syndrome.  Memory and concentration problems. HOW TO MANAGE YOUR BLOOD PRESSURE Blood pressure can be managed effectively with lifestyle changes and medicines (if needed). Your caregiver will help you come up with a plan to bring your blood pressure within a normal range. Your plan should include the following: Education  Read all information provided by your caregivers about how to control blood pressure.  Educate yourself on the latest guidelines and treatment recommendations. New research is always being done to further define the risks and treatments for high blood pressure. Lifestylechanges  Control your weight.  Avoid smoking.  Stay physically active.  Reduce the amount of salt in your diet.  Reduce stress.  Control any chronic conditions, such as high cholesterol or diabetes.  Reduce your alcohol intake. Medicines  Several medicines (antihypertensive medicines) are available, if needed, to bring blood pressure within a normal range. Communication  Review all the medicines you take with your caregiver because there may be  side effects or interactions.  Talk with your caregiver about your diet, exercise habits, and other lifestyle factors that may be contributing to high blood pressure.  See your caregiver regularly. Your caregiver can help you create and adjust your plan for managing high blood pressure. RECOMMENDATIONS FOR TREATMENT AND FOLLOW-UP  The following recommendations are based on current guidelines for managing high blood pressure in nonpregnant adults. Use these recommendations to identify the proper follow-up period or treatment option based on your blood pressure reading. You can discuss these  options with your caregiver.  Systolic pressure of 709 to 643 or diastolic pressure of 80 to 89: Follow up with your caregiver as directed.  Systolic pressure of 838 to 184 or diastolic pressure of 90 to 100: Follow up with your caregiver within 2 months.  Systolic pressure above 037 or diastolic pressure above 543: Follow up with your caregiver within 1 month.  Systolic pressure above 606 or diastolic pressure above 770: Consider antihypertensive therapy; follow up with your caregiver within 1 week.  Systolic pressure above 340 or diastolic pressure above 352: Begin antihypertensive therapy; follow up with your caregiver within 1 week. Document Released: 05/01/2012 Document Reviewed: 05/01/2012 [facility] Patient Information 2015 [place]. This information is not intended to replace advice given to you by your health care Normal Recinos. Make sure you discuss any questions you have with your health care Sindhu Nguyen.

## 2015-03-07 NOTE — Assessment & Plan Note (Signed)
Improved.  -needs PFTs and CT scan chest but no orange card yet -cont to monitor

## 2015-03-07 NOTE — Assessment & Plan Note (Signed)
-  discussed smoking cessation, trying to quit

## 2015-03-07 NOTE — Assessment & Plan Note (Signed)
-  CT scan chest on hold awaiting orange card (needs tax return)

## 2015-03-07 NOTE — Assessment & Plan Note (Signed)
Pt BP still elevated-->reports she did not have the money to pick up losartan.  She states she will pick it up today or tomorrow with the help of her church.  I doubt compliance.  We discussed smoking cessation and lower sodium intake.  Also reminded her that while she is young, uncontrolled HTN can result in stroke, MI, kidney disease requiring dialysis, and blindness.  Also complicated by the fact that she does not have the orange card which she reports having to go to the tax office to receive her income tax return and will then get this info to [facility]. -discussed the importance of getting BP under control -advised smoking cessation -discussed the need to refrain from using illicit drugs (states she does not use recreational drugs) -reminded of the DASH diet -f/u with Bonna Gains  -obtain funds to get losartan (she has the money to purchase cigarettes?)

## 2015-03-09 NOTE — Progress Notes (Signed)
Case discussed with Dr. Gordy Levan at the time of the visit.  We reviewed the resident's history and exam and pertinent patient test results.  I agree with the assessment, diagnosis, and plan of care documented in the resident's note. Madilyn Fireman MD MPH 03/09/2015 2:18 PM

## 2015-04-05 ENCOUNTER — Ambulatory Visit: Payer: Self-pay | Admitting: Pharmacist

## 2015-04-05 NOTE — Addendum Note (Signed)
Addended by: Hulan Fray on: 04/05/2015 06:46 PM   Modules accepted: Orders

## 2015-04-07 ENCOUNTER — Encounter: Payer: Self-pay | Admitting: Internal Medicine

## 2015-05-06 IMAGING — CR DG CHEST 2V
2 series · 2 of 2 positions shown · non-contrast
Comparison: 01/04/2013

CLINICAL DATA: Chest pain, infected left lower leg sore

EXAM:
CHEST  2 VIEW

[w chest pa]
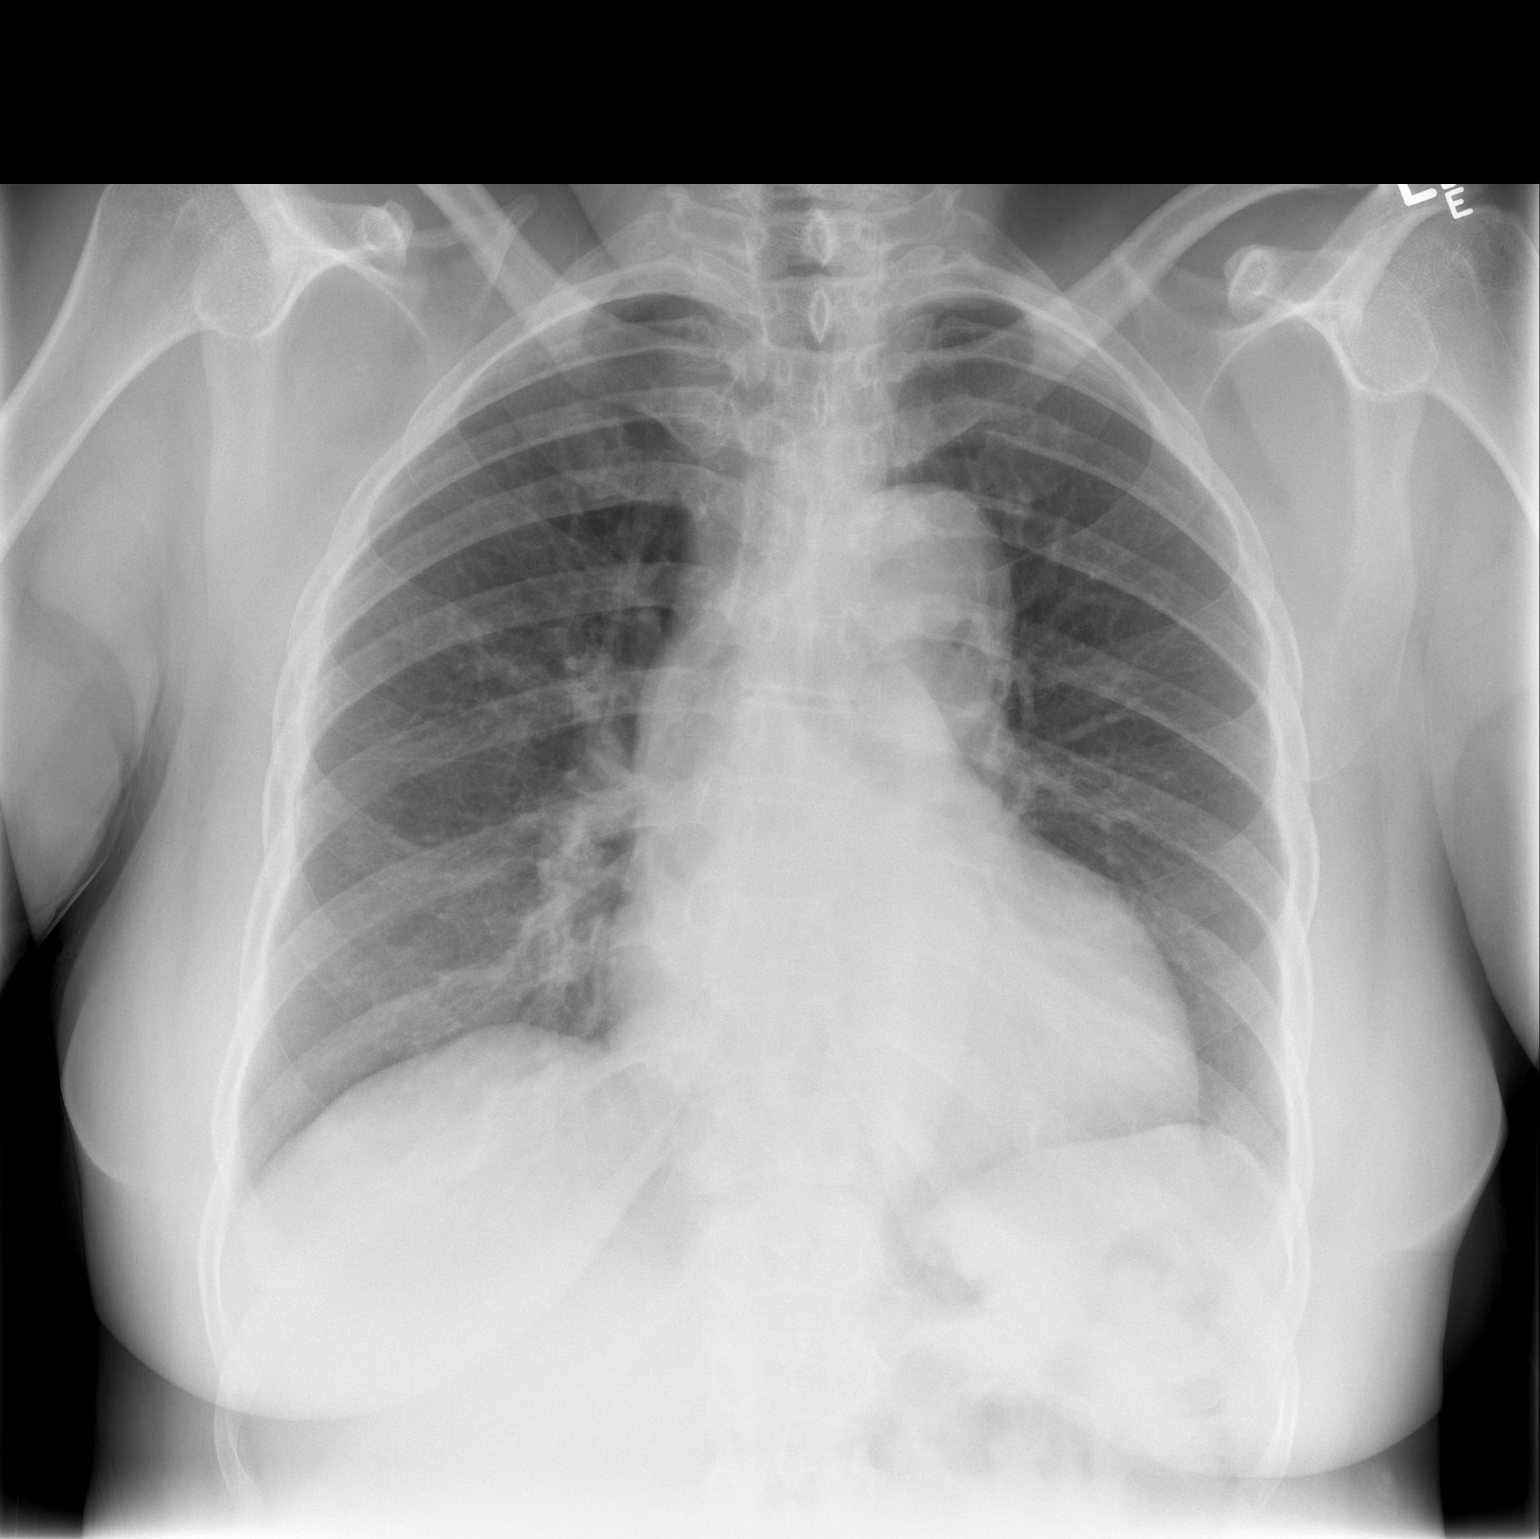

[w chest lat]
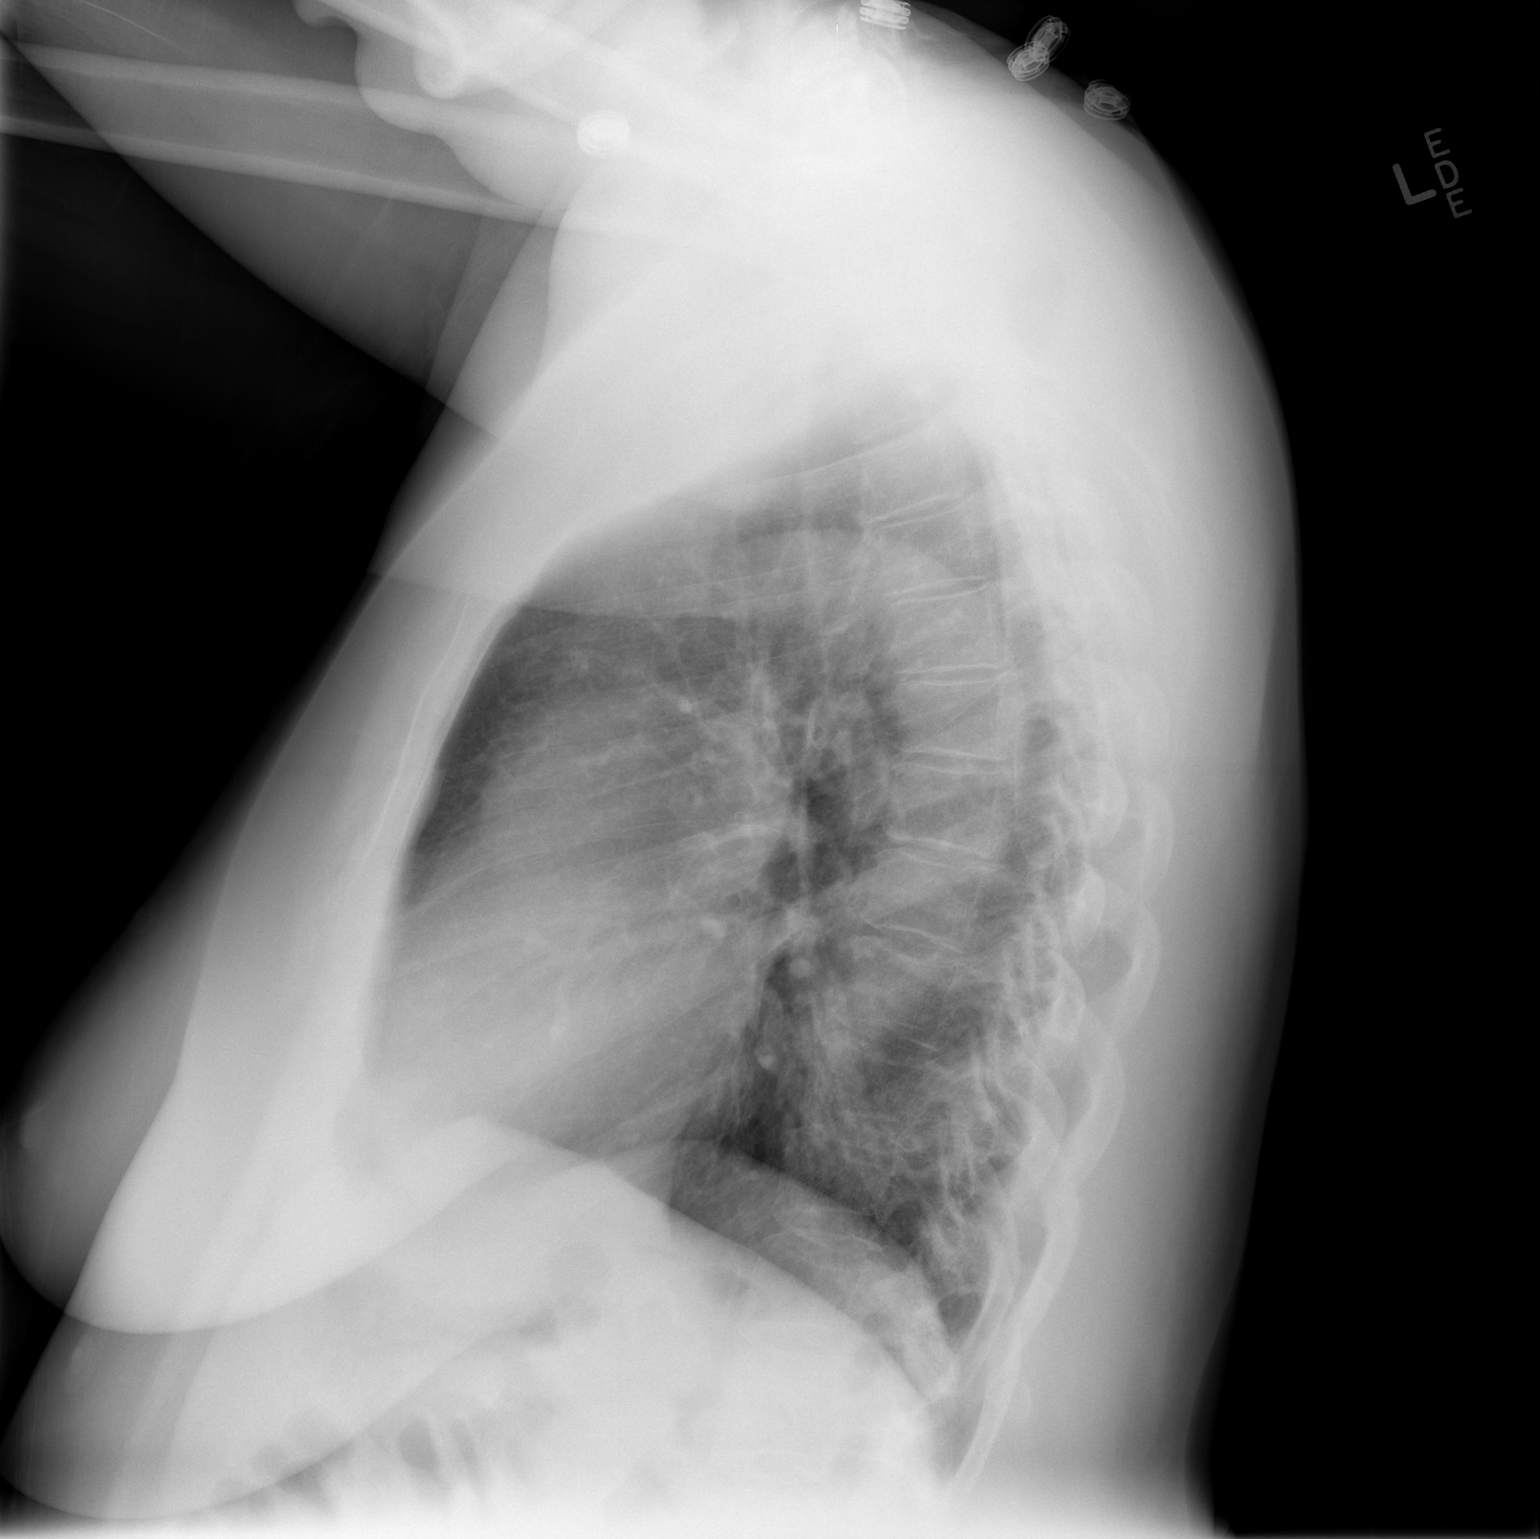

[2 of 2 positions shown; findings below may reference images not displayed]

FINDINGS: The heart size and mediastinal contours are within normal limits.
Both lungs are clear. The visualized skeletal structures are stable.
IMPRESSION: No active cardiopulmonary disease.

## 2015-05-12 ENCOUNTER — Encounter (HOSPITAL_COMMUNITY): Payer: Self-pay

## 2015-05-12 ENCOUNTER — Emergency Department (HOSPITAL_COMMUNITY)
Admission: EM | Admit: 2015-05-12 | Discharge: 2015-05-12 | Disposition: A | Discharge status: Home / Self Care | Payer: Self-pay | Attending: Physician Assistant | Admitting: Physician Assistant

## 2015-05-12 ENCOUNTER — Encounter (HOSPITAL_BASED_OUTPATIENT_CLINIC_OR_DEPARTMENT_OTHER): Payer: Self-pay | Attending: Surgery

## 2015-05-12 ENCOUNTER — Emergency Department (HOSPITAL_COMMUNITY): Payer: Self-pay

## 2015-05-12 DIAGNOSIS — Z862 Personal history of diseases of the blood and blood-forming organs and certain disorders involving the immune mechanism: Secondary | ICD-10-CM | POA: Insufficient documentation

## 2015-05-12 DIAGNOSIS — F17218 Nicotine dependence, cigarettes, with other nicotine-induced disorders: Secondary | ICD-10-CM | POA: Insufficient documentation

## 2015-05-12 DIAGNOSIS — I509 Heart failure, unspecified: Secondary | ICD-10-CM | POA: Insufficient documentation

## 2015-05-12 DIAGNOSIS — I5030 Unspecified diastolic (congestive) heart failure: Secondary | ICD-10-CM | POA: Insufficient documentation

## 2015-05-12 DIAGNOSIS — L97222 Non-pressure chronic ulcer of left calf with fat layer exposed: Secondary | ICD-10-CM | POA: Insufficient documentation

## 2015-05-12 DIAGNOSIS — Z760 Encounter for issue of repeat prescription: Secondary | ICD-10-CM | POA: Insufficient documentation

## 2015-05-12 DIAGNOSIS — Z720 Tobacco use: Secondary | ICD-10-CM | POA: Insufficient documentation

## 2015-05-12 DIAGNOSIS — I87332 Chronic venous hypertension (idiopathic) with ulcer and inflammation of left lower extremity: Secondary | ICD-10-CM | POA: Insufficient documentation

## 2015-05-12 DIAGNOSIS — Z79899 Other long term (current) drug therapy: Secondary | ICD-10-CM | POA: Insufficient documentation

## 2015-05-12 DIAGNOSIS — Z8639 Personal history of other endocrine, nutritional and metabolic disease: Secondary | ICD-10-CM | POA: Insufficient documentation

## 2015-05-12 DIAGNOSIS — Z7982 Long term (current) use of aspirin: Secondary | ICD-10-CM | POA: Insufficient documentation

## 2015-05-12 DIAGNOSIS — I10 Essential (primary) hypertension: Secondary | ICD-10-CM | POA: Insufficient documentation

## 2015-05-12 DIAGNOSIS — E876 Hypokalemia: Secondary | ICD-10-CM | POA: Insufficient documentation

## 2015-05-12 DIAGNOSIS — R030 Elevated blood-pressure reading, without diagnosis of hypertension: Secondary | ICD-10-CM

## 2015-05-12 DIAGNOSIS — J42 Unspecified chronic bronchitis: Secondary | ICD-10-CM | POA: Insufficient documentation

## 2015-05-12 DIAGNOSIS — IMO0001 Reserved for inherently not codable concepts without codable children: Secondary | ICD-10-CM

## 2015-05-12 DIAGNOSIS — Z8709 Personal history of other diseases of the respiratory system: Secondary | ICD-10-CM | POA: Insufficient documentation

## 2015-05-12 DIAGNOSIS — Z8719 Personal history of other diseases of the digestive system: Secondary | ICD-10-CM | POA: Insufficient documentation

## 2015-05-12 DIAGNOSIS — Z872 Personal history of diseases of the skin and subcutaneous tissue: Secondary | ICD-10-CM | POA: Insufficient documentation

## 2015-05-12 LAB — BASIC METABOLIC PANEL
Anion gap: 8 (ref 5–15)
BUN: 11 mg/dL (ref 6–20)
CO2: 32 mmol/L (ref 22–32)
Calcium: 9.6 mg/dL (ref 8.9–10.3)
Chloride: 100 mmol/L — ABNORMAL LOW (ref 101–111)
Creatinine, Ser: 0.82 mg/dL (ref 0.44–1.00)
GFR calc Af Amer: >60 mL/min (ref >60)
Glucose, Bld: 98 mg/dL (ref 65–99)
POTASSIUM: 3 mmol/L — AB (ref 3.5–5.1)
Sodium: 140 mmol/L (ref 135–145)

## 2015-05-12 LAB — CBC
HEMATOCRIT: 39.2 % (ref 36.0–46.0)
Hemoglobin: 12.2 g/dL (ref 12.0–15.0)
MCH: 28 pg (ref 26.0–34.0)
MCHC: 31.1 g/dL (ref 30.0–36.0)
MCV: 89.9 fL (ref 78.0–100.0)
Platelets: 321 10*3/uL (ref 150–400)
RBC: 4.36 MIL/uL (ref 3.87–5.11)
RDW: 13.7 % (ref 11.5–15.5)
WBC: 7.3 10*3/uL (ref 4.0–10.5)

## 2015-05-12 MED ORDER — POTASSIUM CHLORIDE CRYS ER 20 MEQ PO TBCR
40.0000 meq | EXTENDED_RELEASE_TABLET | Freq: Once | ORAL | Status: AC
  Administered 2015-05-12: 40 meq via ORAL
  Filled 2015-05-12: qty 2

## 2015-05-12 MED ORDER — IBUPROFEN 200 MG PO TABS
600.0000 mg | ORAL_TABLET | Freq: Once | ORAL | Status: AC
  Administered 2015-05-12: 600 mg via ORAL
  Filled 2015-05-12: qty 3

## 2015-05-12 MED ORDER — HYDROCHLOROTHIAZIDE 12.5 MG PO CAPS
25.0000 mg | ORAL_CAPSULE | Freq: Once | ORAL | Status: AC
  Administered 2015-05-12: 25 mg via ORAL
  Filled 2015-05-12: qty 2

## 2015-05-12 MED ORDER — LOSARTAN POTASSIUM 50 MG PO TABS
50.0000 mg | ORAL_TABLET | Freq: Once | ORAL | Status: AC
  Administered 2015-05-12: 50 mg via ORAL
  Filled 2015-05-12 (×3): qty 1

## 2015-05-12 NOTE — ED Notes (Signed)
Pt presents w/ elevated BP.  Sts "I've been battling high blood pressure for a while.  They've put me on so many different medications."  Pt reports taking all medications as prescribed.  Pt was recently started on Norvasc and an aspirin.

## 2015-05-12 NOTE — ED Notes (Signed)
Pt reports not being able to control HBP with oral medications that include diuretic. Left lower leg with swelling. Pt sees the wound clinic and was dressed today with new dressing.

## 2015-05-12 NOTE — ED Provider Notes (Addendum)
CSN: [id number]     Arrival date & time 05/12/15  1013 History   First MD Initiated Contact with Patient 05/12/15 1134     Chief Complaint  Patient presents with  . Hypertension     (Consider location/radiation/quality/duration/timing/severity/associated sxs/prior Treatment) HPI   Patietn is very well appearing 54 year old female who ran out of her BP meds. Sent here from wound clinic. No symptoms, just here for med refill   Past Medical History  Diagnosis Date  . Hypertension   . Venous stasis ulcers   . Diastolic heart failure   . Hypokalemia   . Dyslipidemia     pt denies this hx on 05/12/2013  . Anemia   . CHF (congestive heart failure)   . Chronic bronchitis     "probably once/yr" (05/12/2013)  . Exertional shortness of breath   . GERD (gastroesophageal reflux disease)    Past Surgical History  Procedure Laterality Date  . Vaginal hysterectomy  1990's   History reviewed. No pertinent family history. Social History  Substance Use Topics  . Smoking status: Current Every Day Smoker -- 0.50 packs/day for 36 years    Types: Cigarettes  . Smokeless tobacco: Never Used     Comment: trying to cut back.  . Alcohol Use: 4.2 oz/week    7 Cans of beer per week     Comment: Beer once a week.   OB History    No data available     Review of Systems  Constitutional: Negative for activity change.  Respiratory: Negative for chest tightness.   Cardiovascular: Negative for chest pain.  Gastrointestinal: Negative for abdominal pain.  Neurological: Negative for headaches.      Allergies  Lisinopril  Home Medications   Prior to Admission medications   Medication Sig Start Date End Date Taking? Authorizing Atom Solivan  amLODipine (NORVASC) 10 MG tablet Take 1 tablet (10 mg total) by mouth daily. 12/17/14  Yes Noland Fordyce, PA-C  aspirin EC 81 MG EC tablet Take 1 tablet (81 mg total) by mouth daily. 06/13/13  Yes Dorian Heckle, MD  ibuprofen (ADVIL,MOTRIN) 200 MG tablet  Take 800 mg by mouth every 6 (six) hours as needed for moderate pain.   Yes Historical Tocara Mennen, MD  losartan-hydrochlorothiazide (HYZAAR) 100-25 MG per tablet Take 1 tablet by mouth daily. 01/19/15 01/19/16 Yes Jones Bales, MD   BP 197/99 mmHg  Pulse 78  Temp(Src) 98.7 F (37.1 C) (Oral)  Resp 29  Ht 5\' 8"  (1.727 m)  Wt 263 lb (119.296 kg)  BMI 40.00 kg/m2  SpO2 100% Physical Exam  Constitutional: She is oriented to person, place, and time. She appears well-developed and well-nourished.  HENT:  Head: Normocephalic and atraumatic.  Eyes: Conjunctivae are normal. Right eye exhibits no discharge.  Neck: Neck supple.  Cardiovascular: Normal rate, regular rhythm and normal heart sounds.   No murmur heard. Pulmonary/Chest: Effort normal and breath sounds normal. She has no wheezes. She has no rales.  Abdominal: Soft. She exhibits no distension. There is no tenderness.  Musculoskeletal: Normal range of motion.  Neurological: She is oriented to person, place, and time. No cranial nerve deficit.  Skin: Skin is warm and dry. No rash noted. She is not diaphoretic.  Psychiatric: She has a normal mood and affect.  Nursing note and vitals reviewed.   ED Course  Procedures (including critical care time) Labs Review Labs Reviewed  BASIC METABOLIC PANEL - Abnormal; Notable for the following:    Potassium 3.0 (*)  Chloride 100 (*)    All other components within normal limits  CBC    Imaging Review Dg Chest 2 View  05/12/2015   CLINICAL DATA:  Elevated blood pressure today with shortness of breath for 3 weeks. History of hypertension, smoking and bronchitis. Initial encounter.  EXAM: CHEST  2 VIEW  COMPARISON:  12/09/2014 and 06/11/2013 radiographs.  FINDINGS: There is stable cardiomegaly and mild aortic atherosclerosis. The lungs appear unchanged with chronic central airway thickening. There is no edema or confluent airspace opacity. I believe the previously questioned nodule on the  left was due to an EKG snap. There is no pleural effusion. The bones appear stable.  IMPRESSION: Stable chest with cardiomegaly and chronic interstitial prominence. No acute findings.   Electronically Signed   By: Richardean Sale M.D.   On: 05/12/2015 12:11   I have personally reviewed and evaluated these images and lab results as part of my medical decision-making.   EKG Interpretation   Date/Time:  Wednesday May 12 2015 11:22:38 EDT Ventricular Rate:  66 PR Interval:  222 QRS Duration: 91 QT Interval:  437 QTC Calculation: 458 R Axis:   0 Text Interpretation:  Sinus rhythm Prolonged PR interval Baseline wander  in lead(s) V3 V5 no acute iscehmia No significant change since last  tracing Confirmed by Gerald Leitz ([id number]) on 05/12/2015 11:26:06 AM      MDM   Final diagnoses:  None    Patient is a 54 year old female presenting because she was sent here by wound clinic for elevated blood pressure. Patient has run out of one of her blood pressure medication. We will have her fill it today. She says she can go get it filled at [facility] after leaving today.. We will give her her medication here in the meantime. Patient has no pain no headaches.  Patient is on PO potassium at home.  We'll have patient follow up with her primary care physician.  Courteney Julio Alm, MD 05/12/15 [place], MD 07/03/15 [id number]

## 2015-05-12 NOTE — ED Notes (Signed)
RN Faith-Marie notified of Elevated BP Manual 211/108 Automatic 221/102

## 2015-05-12 NOTE — Discharge Instructions (Signed)
Please follow up this week with your regular doct and GET YOUR BLOOD PRESSURE medications filled!  Hypertension Hypertension, commonly called high blood pressure, is when the force of blood pumping through your arteries is too strong. Your arteries are the blood vessels that carry blood from your heart throughout your body. A blood pressure reading consists of a higher number over a lower number, such as 110/72. The higher number (systolic) is the pressure inside your arteries when your heart pumps. The lower number (diastolic) is the pressure inside your arteries when your heart relaxes. Ideally you want your blood pressure below 120/80. Hypertension forces your heart to work harder to pump blood. Your arteries may become narrow or stiff. Having hypertension puts you at risk for heart disease, stroke, and other problems.  RISK FACTORS Some risk factors for high blood pressure are controllable. Others are not.  Risk factors you cannot control include:   Race. You may be at higher risk if you are African American.  Age. Risk increases with age.  Gender. Men are at higher risk than women before age 39 years. After age 47, women are at higher risk than men. Risk factors you can control include:  Not getting enough exercise or physical activity.  Being overweight.  Getting too much fat, sugar, calories, or salt in your diet.  Drinking too much alcohol. SIGNS AND SYMPTOMS Hypertension does not usually cause signs or symptoms. Extremely high blood pressure (hypertensive crisis) may cause headache, anxiety, shortness of breath, and nosebleed. DIAGNOSIS  To check if you have hypertension, your health care provider will measure your blood pressure while you are seated, with your arm held at the level of your heart. It should be measured at least twice using the same arm. Certain conditions can cause a difference in blood pressure between your right and left arms. A blood pressure reading that is  higher than normal on one occasion does not mean that you need treatment. If one blood pressure reading is high, ask your health care provider about having it checked again. TREATMENT  Treating high blood pressure includes making lifestyle changes and possibly taking medicine. Living a healthy lifestyle can help lower high blood pressure. You may need to change some of your habits. Lifestyle changes may include:  Following the DASH diet. This diet is high in fruits, vegetables, and whole grains. It is low in salt, red meat, and added sugars.  Getting at least 2 hours of brisk physical activity every week.  Losing weight if necessary.  Not smoking.  Limiting alcoholic beverages.  Learning ways to reduce stress. If lifestyle changes are not enough to get your blood pressure under control, your health care provider may prescribe medicine. You may need to take more than one. Work closely with your health care provider to understand the risks and benefits. HOME CARE INSTRUCTIONS  Have your blood pressure rechecked as directed by your health care provider.   Take medicines only as directed by your health care provider. Follow the directions carefully. Blood pressure medicines must be taken as prescribed. The medicine does not work as well when you skip doses. Skipping doses also puts you at risk for problems.   Do not smoke.   Monitor your blood pressure at home as directed by your health care provider. SEEK MEDICAL CARE IF:   You think you are having a reaction to medicines taken.  You have recurrent headaches or feel dizzy.  You have swelling in your ankles.  You have  trouble with your vision. SEEK IMMEDIATE MEDICAL CARE IF:  You develop a severe headache or confusion.  You have unusual weakness, numbness, or feel faint.  You have severe chest or abdominal pain.  You vomit repeatedly.  You have trouble breathing. MAKE SURE YOU:   Understand these  instructions.  Will watch your condition.  Will get help right away if you are not doing well or get worse. Document Released: 08/07/2005 Document Revised: 12/22/2013 Document Reviewed: 05/30/2013 [facility] Patient Information 2015 [place], [place]. This information is not intended to replace advice given to you by your health care provider. Make sure you discuss any questions you have with your health care provider.

## 2015-05-12 NOTE — ED Notes (Signed)
Used the Steady to assist patient to the bathroom

## 2015-05-12 NOTE — ED Notes (Signed)
Meal given

## 2015-05-26 ENCOUNTER — Encounter (HOSPITAL_BASED_OUTPATIENT_CLINIC_OR_DEPARTMENT_OTHER): Payer: Self-pay | Attending: Surgery

## 2015-05-26 DIAGNOSIS — F17210 Nicotine dependence, cigarettes, uncomplicated: Secondary | ICD-10-CM | POA: Insufficient documentation

## 2015-05-26 DIAGNOSIS — L97821 Non-pressure chronic ulcer of other part of left lower leg limited to breakdown of skin: Secondary | ICD-10-CM | POA: Insufficient documentation

## 2015-05-26 DIAGNOSIS — I5030 Unspecified diastolic (congestive) heart failure: Secondary | ICD-10-CM | POA: Insufficient documentation

## 2015-05-26 DIAGNOSIS — I872 Venous insufficiency (chronic) (peripheral): Secondary | ICD-10-CM | POA: Insufficient documentation

## 2015-05-26 DIAGNOSIS — I10 Essential (primary) hypertension: Secondary | ICD-10-CM | POA: Insufficient documentation

## 2015-05-26 DIAGNOSIS — I739 Peripheral vascular disease, unspecified: Secondary | ICD-10-CM | POA: Insufficient documentation

## 2015-06-01 ENCOUNTER — Ambulatory Visit (INDEPENDENT_AMBULATORY_CARE_PROVIDER_SITE_OTHER)
Admission: RE | Admit: 2015-06-01 | Discharge: 2015-06-01 | Disposition: A | Discharge status: Home / Self Care | Payer: Self-pay | Source: Ambulatory Visit | Attending: Vascular Surgery | Admitting: Vascular Surgery

## 2015-06-01 ENCOUNTER — Other Ambulatory Visit: Payer: Self-pay | Admitting: Surgery

## 2015-06-01 ENCOUNTER — Ambulatory Visit (HOSPITAL_COMMUNITY)
Admission: RE | Admit: 2015-06-01 | Discharge: 2015-06-01 | Disposition: A | Discharge status: Home / Self Care | Payer: Self-pay | Source: Ambulatory Visit | Attending: Vascular Surgery | Admitting: Vascular Surgery

## 2015-06-01 DIAGNOSIS — L97929 Non-pressure chronic ulcer of unspecified part of left lower leg with unspecified severity: Secondary | ICD-10-CM

## 2015-06-23 ENCOUNTER — Encounter (HOSPITAL_BASED_OUTPATIENT_CLINIC_OR_DEPARTMENT_OTHER): Payer: Self-pay | Attending: Surgery

## 2015-06-23 DIAGNOSIS — I87332 Chronic venous hypertension (idiopathic) with ulcer and inflammation of left lower extremity: Secondary | ICD-10-CM | POA: Insufficient documentation

## 2015-06-23 DIAGNOSIS — I739 Peripheral vascular disease, unspecified: Secondary | ICD-10-CM | POA: Insufficient documentation

## 2015-06-23 DIAGNOSIS — I872 Venous insufficiency (chronic) (peripheral): Secondary | ICD-10-CM | POA: Insufficient documentation

## 2015-06-23 DIAGNOSIS — F17218 Nicotine dependence, cigarettes, with other nicotine-induced disorders: Secondary | ICD-10-CM | POA: Insufficient documentation

## 2015-06-23 DIAGNOSIS — I5030 Unspecified diastolic (congestive) heart failure: Secondary | ICD-10-CM | POA: Insufficient documentation

## 2015-06-23 DIAGNOSIS — I110 Hypertensive heart disease with heart failure: Secondary | ICD-10-CM | POA: Insufficient documentation

## 2015-06-23 DIAGNOSIS — L97821 Non-pressure chronic ulcer of other part of left lower leg limited to breakdown of skin: Secondary | ICD-10-CM | POA: Insufficient documentation

## 2015-06-25 ENCOUNTER — Encounter: Payer: Self-pay | Admitting: Vascular Surgery

## 2015-06-29 ENCOUNTER — Encounter: Payer: Self-pay | Admitting: Vascular Surgery

## 2015-06-29 ENCOUNTER — Ambulatory Visit (INDEPENDENT_AMBULATORY_CARE_PROVIDER_SITE_OTHER): Payer: Self-pay | Admitting: Vascular Surgery

## 2015-06-29 VITALS — BP 174/103 | HR 82 | Temp 98.5°F | Resp 16 | Ht 68.0 in | Wt 259.0 lb

## 2015-06-29 DIAGNOSIS — I87313 Chronic venous hypertension (idiopathic) with ulcer of bilateral lower extremity: Secondary | ICD-10-CM

## 2015-06-29 DIAGNOSIS — L97929 Non-pressure chronic ulcer of unspecified part of left lower leg with unspecified severity: Principal | ICD-10-CM

## 2015-06-29 DIAGNOSIS — IMO0001 Reserved for inherently not codable concepts without codable children: Secondary | ICD-10-CM

## 2015-06-29 DIAGNOSIS — L97919 Non-pressure chronic ulcer of unspecified part of right lower leg with unspecified severity: Principal | ICD-10-CM

## 2015-06-29 NOTE — Progress Notes (Signed)
Filed Vitals:   06/29/15 1312 06/29/15 1322  BP: 191/101 174/103  Pulse: 84 82  Temp: 98.5 F (36.9 C)   TempSrc: Oral   Resp: 16   Height: 5\' 8"  (1.727 m)   Weight: 259 lb (117.482 kg)   SpO2: 99%

## 2015-06-29 NOTE — Progress Notes (Signed)
Referred by:  Jones Bales, MD [place], [place] [address]  Reason for referral: Bilateral leg swelling with ulceration  History of Present Illness  [name] is a 54 y.o. ([birth year]) female who presents with chief complaint: left leg ulcer. She has been going to the wound center for the past three weeks and has received unna boot therapy. She says her wound has improved. She reports three year history of bilateral lower extremity swelling. She has had four previous venous stasis ulcers in her left leg and two in her right. These wounds all healed within three to four weeks. She is currently wearing compression stockings on the right leg. She complains of pain with her ulcers and heaviness in her legs. She reports that elevation helps with the swelling. She denies a prior history of DVT. She does have chronic skin changes. She denies any family history of venous disorders. She denies any history of claudication. She is currently unemployed.   She has a past medical history of hypertension, dyslipidemia and CHF.   Past Medical History  Diagnosis Date  . Hypertension   . Venous stasis ulcers ([place])   . Diastolic heart failure   . Hypokalemia   . Dyslipidemia     pt denies this hx on 05/12/2013  . Anemia   . CHF (congestive heart failure) ([place])   . Chronic bronchitis ([place])     "probably once/yr" (05/12/2013)  . Exertional shortness of breath   . GERD (gastroesophageal reflux disease)     Past Surgical History  Procedure Laterality Date  . Vaginal hysterectomy  1990's    Social History   Social History  . Marital Status: Single    Spouse Name: N/A  . Number of Children: N/A  . Years of Education: N/A   Occupational History  . Not on file.   Social History Main Topics  . Smoking status: Current Every Day Smoker -- 0.50 packs/day for 36 years    Types: Cigarettes  . Smokeless tobacco: Never Used     Comment: trying to cut back.  . Alcohol Use: 4.2  oz/week    7 Cans of beer per week     Comment: Beer once a week.  . Drug Use: No     Comment: 05/12/2013 "tried it a few days ago; didn't like it"  . Sexual Activity: Not on file   Other Topics Concern  . Not on file   Social History Narrative    Family History  Problem Relation Age of Onset  . Hypertension Mother   . Hypertension Sister   . Diabetes Sister   . Hypertension Brother   . Diabetes Sister     Current Outpatient Prescriptions on File Prior to Visit  Medication Sig Dispense Refill  . amLODipine (NORVASC) 10 MG tablet Take 1 tablet (10 mg total) by mouth daily. 30 tablet 0  . aspirin EC 81 MG EC tablet Take 1 tablet (81 mg total) by mouth daily. 30 tablet 3  . ibuprofen (ADVIL,MOTRIN) 200 MG tablet Take 800 mg by mouth every 6 (six) hours as needed for moderate pain.    Marland Kitchen losartan-hydrochlorothiazide (HYZAAR) 100-25 MG per tablet Take 1 tablet by mouth daily. 90 tablet 0   No current facility-administered medications on file prior to visit.    Allergies  Allergen Reactions  . Lisinopril Cough    REVIEW OF SYSTEMS:  (Positives checked otherwise negative)  CARDIOVASCULAR:  []  chest pain, []  chest pressure, []   palpitations, [x]  shortness of breath when laying flat, []  shortness of breath with exertion,  [x]  pain in legs when walking, []  pain in feet when laying flat, []  history of blood clot in veins (DVT), []  history of phlebitis, [x]  swelling in legs, []  varicose veins  PULMONARY:  []  productive cough, []  asthma, []  wheezing  NEUROLOGIC:  []  weakness in arms or legs, []  numbness in arms or legs, []  difficulty speaking or slurred speech, []  temporary loss of vision in one eye, []  dizziness  HEMATOLOGIC:  []  bleeding problems, []  problems with blood clotting too easily  MUSCULOSKEL:  []  joint pain, []  joint swelling  GASTROINTEST:  []  vomiting blood, []  blood in stool     GENITOURINARY:  []  burning with urination, []  blood in urine  PSYCHIATRIC:  []  history  of major depression  INTEGUMENTARY:  []  rashes, []  ulcers  CONSTITUTIONAL:  []  fever, []  chills   Physical Examination Filed Vitals:   06/29/15 1312 06/29/15 1322  BP: 191/101 174/103  Pulse: 84 82  Temp: 98.5 F (36.9 C)   TempSrc: Oral   Resp: 16   Height: 5\' 8"  (1.727 m)   Weight: 259 lb (117.482 kg)   SpO2: 99%    Body mass index is 39.39 kg/(m^2).  General: A&O x 3, Morbidly obese female in NAD  Head: [place]/AT  Pulmonary: Sym exp, good air movt, CTAB, no rales, rhonchi, & wheezing  Cardiac: RRR, Nl S1, S2, systolic murmur, no carotid bruits.   Vascular: 2+ radials and 2+ dorsalis pedis pulses bilaterally   Musculoskeletal: 2+ edema right leg, left leg minimally swollen due to unna boot application, small superficial ulcer to left lateral leg, hemosiderin staining to lower legs bilaterally.   Neurologic: No focal deficits.   Psychiatric: Judgment intact, Mood & affect appropriate for pt's clinical situation  Non-Invasive Vascular Imaging  BLE Venous Insufficiency Duplex (Date: 06/29/2015):   LLE: No evidence of DVT, left GSV reflux present with diameters of 0.81 cm at [facility], 0.65cm at proximal thigh and 0.55 cm at mid thigh, popliteal and small saphenous veins were not adequately interrogated; deep venous reflux in the common femoral vein, no reflux in the femoral vein.   Medical Decision Making  [name] is a 54 y.o. female who presents with: bilateral lower extremity chronic venous insufficiency (C6).   The patient has a history of bilateral lower extremity venous stasis ulceration. Her current left leg wound is healing with unna boot application. All of her previous venous stasis ulcers have healed within 4 weeks.   Her great saphenous veins bilaterally on sonosite exam were not markedly dilated. Since her current left leg wound is healing, will have her follow up in 3 months for further evaluation.  She has adequate arterial circulation.   She will  continue to go to the wound center for unna boots.   Recommended use of compression stocks for swelling management once her left leg wound hass healed. Advised her to also wear compression on her right leg.   Virgina Jock, PA-C [facility] Office: [phone] Pager: [phone]  06/29/2015, 1:57 PM  This patient was seen and examined in conjunction with Dr. Kellie Simmering Agree with above assessment Patient does have reflux in left great saphenous vein but not large caliber vein Skin findings consistent with chronic venous insufficiency. Recent venous ulcer lateral aspect left leg is healing rapidly with trips to wound center  Patient will return in 3 months for continued follow-up to see  if wound has healed.  consider laser ablation left great saphenous vein.

## 2015-07-13 ENCOUNTER — Encounter: Payer: Self-pay | Admitting: Student

## 2015-09-28 ENCOUNTER — Encounter: Payer: Self-pay | Admitting: Vascular Surgery

## 2015-10-05 ENCOUNTER — Ambulatory Visit (INDEPENDENT_AMBULATORY_CARE_PROVIDER_SITE_OTHER): Payer: Self-pay | Admitting: Vascular Surgery

## 2015-10-05 ENCOUNTER — Encounter: Payer: Self-pay | Admitting: Vascular Surgery

## 2015-10-05 VITALS — BP 194/87 | HR 71 | Temp 98.0°F | Resp 18 | Ht 68.0 in | Wt 253.0 lb

## 2015-10-05 DIAGNOSIS — I83892 Varicose veins of left lower extremities with other complications: Secondary | ICD-10-CM | POA: Insufficient documentation

## 2015-10-05 NOTE — Progress Notes (Signed)
Subjective:     Patient ID: [name], female   DOB: 03/07/1961, 55 y.o.   MRN: [id number]  HPI This 55 year old female returns for continued follow-up regarding her severe venous disease in the left leg with history of recurrent venous stasis ulcers. Her ulcers has returned on the left leg since previous visit. Patient has not been to the wound center in several months but does have an appointment later this week. Is a history of DVT or thrombophlebitis. She recently purchased some elastic compression stockings but it does not sound like she is wearing them on a regular basis.  Past Medical History  Diagnosis Date  . Hypertension   . Venous stasis ulcers ([place])   . Diastolic heart failure   . Hypokalemia   . Dyslipidemia     pt denies this hx on 05/12/2013  . Anemia   . CHF (congestive heart failure) ([place])   . Chronic bronchitis ([place])     "probably once/yr" (05/12/2013)  . Exertional shortness of breath   . GERD (gastroesophageal reflux disease)     Social History  Substance Use Topics  . Smoking status: Current Every Day Smoker -- 0.50 packs/day for 36 years    Types: Cigarettes  . Smokeless tobacco: Never Used     Comment: trying to cut back.  . Alcohol Use: 4.2 oz/week    7 Cans of beer per week     Comment: Beer once a week.    Family History  Problem Relation Age of Onset  . Hypertension Mother   . Hypertension Sister   . Diabetes Sister   . Hypertension Brother   . Diabetes Sister     Allergies  Allergen Reactions  . Lisinopril Cough     Current outpatient prescriptions:  .  amLODipine (NORVASC) 10 MG tablet, Take 1 tablet (10 mg total) by mouth daily., Disp: 30 tablet, Rfl: 0 .  aspirin EC 81 MG EC tablet, Take 1 tablet (81 mg total) by mouth daily., Disp: 30 tablet, Rfl: 3 .  ibuprofen (ADVIL,MOTRIN) 200 MG tablet, Take 800 mg by mouth every 6 (six) hours as needed for moderate pain., Disp: , Rfl:  .  losartan-hydrochlorothiazide (HYZAAR) 100-25 MG per  tablet, Take 1 tablet by mouth daily., Disp: 90 tablet, Rfl: 0  Filed Vitals:   10/05/15 1418 10/05/15 1419  BP: 207/102 194/87  Pulse: 72 71  Temp: 98 F (36.7 C)   Resp: 18   Height: 5\' 8"  (1.727 m)   Weight: 253 lb (114.76 kg)   SpO2: 99%     Body mass index is 38.48 kg/(m^2).           Review of Systems Denies chest pain , dyspnea on exertion, PND, orthopnea, hemoptysis     Objective:   Physical Exam BP 194/87 mmHg  Pulse 71  Temp(Src) 98 F (36.7 C)  Resp 18  Ht 5\' 8"  (1.727 m)  Wt 253 lb (114.76 kg)  BMI 38.48 kg/m2  SpO2 99%   [place] chronically ill-appearing female no apparent distress alert and oriented 3 Lungs no rhonchi or wheezing Left leg with ulcerated area lateral calf about 2-1/2 cm in diameter. Hyperpigmentation lower half both lower extremities. Excellent arterial flow to both feet.  Previous ABIs are normal in the lower extremities with good arterial flow.   today I performed a bedside venous duplex exam with the sono site to see if she was a candidate for laser ablation and compared this to the previous study performed  06/01/2015. She does not have gross reflux throughout the left great saphenous vein.     Assessment:      recurrent venous stasis ulcer left leg with no long segment of gross refluxing great saphenous vein     Plan:      patient to return to wound center for continued care with elastic compression and Unna boots Stress to her the importance of wearing short leg elastic compression stockings on a daily basis after this is healed and we can fit her for some lower office if she would like   Elevate foot of bed at night Return to see me on when necessary basis but not a candidate for laser ablation present time

## 2015-10-05 NOTE — Progress Notes (Signed)
Filed Vitals:   10/05/15 1418 10/05/15 1419  BP: 207/102 194/87  Pulse: 72 71  Temp: 98 F (36.7 C)   Resp: 18   Height: 5\' 8"  (1.727 m)   Weight: 253 lb (114.76 kg)   SpO2: 99%

## 2015-10-07 ENCOUNTER — Encounter (HOSPITAL_BASED_OUTPATIENT_CLINIC_OR_DEPARTMENT_OTHER): Payer: Self-pay | Attending: Internal Medicine

## 2015-10-07 DIAGNOSIS — I509 Heart failure, unspecified: Secondary | ICD-10-CM | POA: Insufficient documentation

## 2015-10-07 DIAGNOSIS — L97821 Non-pressure chronic ulcer of other part of left lower leg limited to breakdown of skin: Secondary | ICD-10-CM | POA: Insufficient documentation

## 2015-10-07 DIAGNOSIS — I87332 Chronic venous hypertension (idiopathic) with ulcer and inflammation of left lower extremity: Secondary | ICD-10-CM | POA: Insufficient documentation

## 2015-10-07 DIAGNOSIS — F17210 Nicotine dependence, cigarettes, uncomplicated: Secondary | ICD-10-CM | POA: Insufficient documentation

## 2015-10-07 DIAGNOSIS — L03116 Cellulitis of left lower limb: Secondary | ICD-10-CM | POA: Insufficient documentation

## 2015-10-07 DIAGNOSIS — R011 Cardiac murmur, unspecified: Secondary | ICD-10-CM | POA: Insufficient documentation

## 2015-10-07 DIAGNOSIS — I110 Hypertensive heart disease with heart failure: Secondary | ICD-10-CM | POA: Insufficient documentation

## 2015-10-15 ENCOUNTER — Telehealth: Payer: Self-pay | Admitting: Internal Medicine

## 2015-10-15 NOTE — Telephone Encounter (Signed)
APPT REMINDER CALL, LMTCB IF SHE NEEDS TO CANCEL °

## 2015-10-18 ENCOUNTER — Ambulatory Visit (INDEPENDENT_AMBULATORY_CARE_PROVIDER_SITE_OTHER): Payer: Self-pay | Admitting: Internal Medicine

## 2015-10-18 VITALS — BP 180/70 | HR 71 | Temp 98.2°F | Ht 68.0 in | Wt 254.3 lb

## 2015-10-18 DIAGNOSIS — N179 Acute kidney failure, unspecified: Secondary | ICD-10-CM

## 2015-10-18 DIAGNOSIS — E785 Hyperlipidemia, unspecified: Secondary | ICD-10-CM

## 2015-10-18 DIAGNOSIS — Z720 Tobacco use: Secondary | ICD-10-CM

## 2015-10-18 DIAGNOSIS — F17210 Nicotine dependence, cigarettes, uncomplicated: Secondary | ICD-10-CM

## 2015-10-18 DIAGNOSIS — I83892 Varicose veins of left lower extremities with other complications: Secondary | ICD-10-CM

## 2015-10-18 DIAGNOSIS — L97929 Non-pressure chronic ulcer of unspecified part of left lower leg with unspecified severity: Secondary | ICD-10-CM

## 2015-10-18 DIAGNOSIS — R911 Solitary pulmonary nodule: Secondary | ICD-10-CM

## 2015-10-18 DIAGNOSIS — I10 Essential (primary) hypertension: Secondary | ICD-10-CM

## 2015-10-18 DIAGNOSIS — I83029 Varicose veins of left lower extremity with ulcer of unspecified site: Secondary | ICD-10-CM

## 2015-10-18 DIAGNOSIS — Z0000 Encounter for general adult medical examination without abnormal findings: Secondary | ICD-10-CM

## 2015-10-18 MED ORDER — LOSARTAN POTASSIUM-HCTZ 100-25 MG PO TABS: 1.0000 | ORAL_TABLET | Freq: Every day | ORAL | Status: DC

## 2015-10-18 MED ORDER — SPIRONOLACTONE 25 MG PO TABS: 25.0000 mg | ORAL_TABLET | Freq: Every day | ORAL | Status: DC

## 2015-10-18 MED ORDER — AMLODIPINE BESYLATE 10 MG PO TABS: 10.0000 mg | ORAL_TABLET | Freq: Every day | ORAL | Status: DC

## 2015-10-18 NOTE — Assessment & Plan Note (Addendum)
A: Uncontrolled HTN, this has been a long standing problem with [name].  I think the etiology is likely compliance.  I have asked Dr. Maudie Mercury our pharmacist to investigate her medication refill history.  She reports using ibuprofen and I instructed her to stop that and use tylenol for pain.  She reports occasional alcohol use.  We discussed that alcohol and ibuprofen would raise her BP.  She denies any illicit drug use.  If compliance with all medications is verified by Dr. Maudie Mercury, we may need to consider hyperaldosteroism given hypokalemia also.  Of note, I think that cost is a big factor in her compliance with medications.  P: -advised dangers of prolonged HTN -DASH diet -No NSAID use -cont current meds  -add spironolactone  -check CMP, Mg, lipid panel  ADDENDUM: Dr. Maudie Mercury researched her refill history and she is not consistent with her meds.  Dr. Maudie Mercury will help facilitate this.  She will also need to be on a statin based on lipid panel.  I have asked Dr. Maudie Mercury to help with the statin therapy that would be affordable for [name].

## 2015-10-18 NOTE — Assessment & Plan Note (Signed)
A: Lung nodule, recent CXR revealed that radiologist felt nodule was actually an EKG snap and not a nodule.   P: -no f/u needed although pt advised to quit smoking

## 2015-10-18 NOTE — Patient Instructions (Addendum)
Thank you for your visit today.   Please return to the internal medicine clinic in 1-2 weeks to recheck labs and blood pressure.       I have made the following additions/changes to your medications: Please take your blood pressure medications and follow the DASH diet.  I have added a medication called spironolactone to your regimen which should help your blood pressure.  We really need to get this under control.  Please take tylenol for pain and do not take any more ibuprofen.  Decrease your salt intake to less than 2000mg  per day.  Also, try to exercise and lose weight will help your blood pressure.   Please be sure to bring all of your medications with you to every visit; this includes herbal supplements, vitamins, eye drops, and any over-the-counter medications.   Should you have any questions regarding your medications and/or any new or worsening symptoms, please be sure to call the clinic at [phone].   If you believe that you are suffering from a life threatening condition or one that may result in the loss of limb or function, then you should call 911 and proceed to the nearest Emergency Department.  Smoking Cessation, Tips for Success If you are ready to quit smoking, congratulations! You have chosen to help yourself be healthier. Cigarettes bring nicotine, tar, carbon monoxide, and other irritants into your body. Your lungs, heart, and blood vessels will be able to work better without these poisons. There are many different ways to quit smoking. Nicotine gum, nicotine patches, a nicotine inhaler, or nicotine nasal spray can help with physical craving. Hypnosis, support groups, and medicines help break the habit of smoking. WHAT THINGS CAN I DO TO MAKE QUITTING EASIER?  Here are some tips to help you quit for good:  Pick a date when you will quit smoking completely. Tell all of your friends and family about your plan to quit on that date.  Do not try to slowly cut down on the number  of cigarettes you are smoking. Pick a quit date and quit smoking completely starting on that day.  Throw away all cigarettes.   Clean and remove all ashtrays from your home, work, and car.  On a card, write down your reasons for quitting. Carry the card with you and read it when you get the urge to smoke.  Cleanse your body of nicotine. Drink enough water and fluids to keep your urine clear or pale yellow. Do this after quitting to flush the nicotine from your body.  Learn to predict your moods. Do not let a bad situation be your excuse to have a cigarette. Some situations in your life might tempt you into wanting a cigarette.  Never have "just one" cigarette. It leads to wanting another and another. Remind yourself of your decision to quit.  Change habits associated with smoking. If you smoked while driving or when feeling stressed, try other activities to replace smoking. Stand up when drinking your coffee. Brush your teeth after eating. Sit in a different chair when you read the paper. Avoid alcohol while trying to quit, and try to drink fewer caffeinated beverages. Alcohol and caffeine may urge you to smoke.  Avoid foods and drinks that can trigger a desire to smoke, such as sugary or spicy foods and alcohol.  Ask people who smoke not to smoke around you.  Have something planned to do right after eating or having a cup of coffee. For example, plan to take a walk  or exercise.  Try a relaxation exercise to calm you down and decrease your stress. Remember, you may be tense and nervous for the first 2 weeks after you quit, but this will pass.  Find new activities to keep your hands busy. Play with a pen, coin, or rubber band. Doodle or draw things on paper.  Brush your teeth right after eating. This will help cut down on the craving for the taste of tobacco after meals. You can also try mouthwash.   Use oral substitutes in place of cigarettes. Try using lemon drops, carrots, cinnamon  sticks, or chewing gum. Keep them handy so they are available when you have the urge to smoke.  When you have the urge to smoke, try deep breathing.  Designate your home as a nonsmoking area.  If you are a heavy smoker, ask your health care provider about a prescription for nicotine chewing gum. It can ease your withdrawal from nicotine.  Reward yourself. Set aside the cigarette money you save and buy yourself something nice.  Look for support from others. Join a support group or smoking cessation program. Ask someone at home or at work to help you with your plan to quit smoking.  Always ask yourself, "Do I need this cigarette or is this just a reflex?" Tell yourself, "Today, I choose not to smoke," or "I do not want to smoke." You are reminding yourself of your decision to quit.  Do not replace cigarette smoking with electronic cigarettes (commonly called e-cigarettes). The safety of e-cigarettes is unknown, and some may contain harmful chemicals.  If you relapse, do not give up! Plan ahead and think about what you will do the next time you get the urge to smoke. HOW WILL I FEEL WHEN I QUIT SMOKING? You may have symptoms of withdrawal because your body is used to nicotine (the addictive substance in cigarettes). You may crave cigarettes, be irritable, feel very hungry, cough often, get headaches, or have difficulty concentrating. The withdrawal symptoms are only temporary. They are strongest when you first quit but will go away within 10-14 days. When withdrawal symptoms occur, stay in control. Think about your reasons for quitting. Remind yourself that these are signs that your body is healing and getting used to being without cigarettes. Remember that withdrawal symptoms are easier to treat than the major diseases that smoking can cause.  Even after the withdrawal is over, expect periodic urges to smoke. However, these cravings are generally short lived and will go away whether you smoke or  not. Do not smoke! WHAT RESOURCES ARE AVAILABLE TO HELP ME QUIT SMOKING? Your health care provider can direct you to community resources or hospitals for support, which may include:  Group support.  Education.  Hypnosis.  Therapy.   This information is not intended to replace advice given to you by your health care provider. Make sure you discuss any questions you have with your health care provider.   Document Released: 05/05/2004 Document Revised: 08/28/2014 Document Reviewed: 01/23/2013 Elsevier Interactive Patient Education 2016 [place] DASH stands for "Dietary Approaches to Stop Hypertension." The DASH eating plan is a healthy eating plan that has been shown to reduce high blood pressure (hypertension). Additional health benefits may include reducing the risk of type 2 diabetes mellitus, heart disease, and stroke. The DASH eating plan may also help with weight loss. WHAT DO I NEED TO KNOW ABOUT THE DASH EATING PLAN? For the DASH eating plan, you will  follow these general guidelines:  Choose foods with a percent daily value for sodium of less than 5% (as listed on the food label).  Use salt-free seasonings or herbs instead of table salt or sea salt.  Check with your health care provider or pharmacist before using salt substitutes.  Eat lower-sodium products, often labeled as "lower sodium" or "no salt added."  Eat fresh foods.  Eat more vegetables, fruits, and low-fat dairy products.  Choose whole grains. Look for the word "whole" as the first word in the ingredient list.  Choose fish and skinless chicken or [place] more often than red meat. Limit fish, poultry, and meat to 6 oz (170 g) each day.  Limit sweets, desserts, sugars, and sugary drinks.  Choose heart-healthy fats.  Limit cheese to 1 oz (28 g) per day.  Eat more home-cooked food and less restaurant, buffet, and fast food.  Limit fried foods.  Cook foods using methods other than  frying.  Limit canned vegetables. If you do use them, rinse them well to decrease the sodium.  When eating at a restaurant, ask that your food be prepared with less salt, or no salt if possible. WHAT FOODS CAN I EAT? Seek help from a dietitian for individual calorie needs. Grains Whole grain or whole wheat bread. Brown rice. Whole grain or whole wheat pasta. Quinoa, bulgur, and whole grain cereals. Low-sodium cereals. Corn or whole wheat flour tortillas. Whole grain cornbread. Whole grain crackers. Low-sodium crackers. Vegetables Fresh or frozen vegetables (raw, steamed, roasted, or grilled). Low-sodium or reduced-sodium tomato and vegetable juices. Low-sodium or reduced-sodium tomato sauce and paste. Low-sodium or reduced-sodium canned vegetables.  Fruits All fresh, canned (in natural juice), or frozen fruits. Meat and Other Protein Products Ground beef (85% or leaner), grass-fed beef, or beef trimmed of fat. Skinless chicken or [place]. Ground chicken or [place]. Pork trimmed of fat. All fish and seafood. Eggs. Dried beans, peas, or lentils. Unsalted nuts and seeds. Unsalted canned beans. Dairy Low-fat dairy products, such as skim or 1% milk, 2% or reduced-fat cheeses, low-fat ricotta or cottage cheese, or plain low-fat yogurt. Low-sodium or reduced-sodium cheeses. Fats and Oils Tub margarines without trans fats. Light or reduced-fat mayonnaise and salad dressings (reduced sodium). Avocado. Safflower, olive, or canola oils. Natural peanut or almond butter. Other Unsalted popcorn and pretzels. The items listed above may not be a complete list of recommended foods or beverages. Contact your dietitian for more options. WHAT FOODS ARE NOT RECOMMENDED? Grains White bread. White pasta. White rice. Refined cornbread. Bagels and croissants. Crackers that contain trans fat. Vegetables Creamed or fried vegetables. Vegetables in a cheese sauce. Regular canned vegetables. Regular canned tomato sauce  and paste. Regular tomato and vegetable juices. Fruits Dried fruits. Canned fruit in light or heavy syrup. Fruit juice. Meat and Other Protein Products Fatty cuts of meat. Ribs, chicken wings, bacon, sausage, bologna, salami, chitterlings, fatback, hot dogs, bratwurst, and packaged luncheon meats. Salted nuts and seeds. Canned beans with salt. Dairy Whole or 2% milk, cream, half-and-half, and cream cheese. Whole-fat or sweetened yogurt. Full-fat cheeses or blue cheese. Nondairy creamers and whipped toppings. Processed cheese, cheese spreads, or cheese curds. Condiments Onion and garlic salt, seasoned salt, table salt, and sea salt. Canned and packaged gravies. Worcestershire sauce. Tartar sauce. Barbecue sauce. Teriyaki sauce. Soy sauce, including reduced sodium. Steak sauce. Fish sauce. Oyster sauce. Cocktail sauce. Horseradish. Ketchup and mustard. Meat flavorings and tenderizers. Bouillon cubes. Hot sauce. Tabasco sauce. Marinades. Taco seasonings. Relishes. Fats and Oils  Butter, stick margarine, lard, shortening, ghee, and bacon fat. Coconut, palm kernel, or palm oils. Regular salad dressings. Other Pickles and olives. Salted popcorn and pretzels. The items listed above may not be a complete list of foods and beverages to avoid. Contact your dietitian for more information. WHERE CAN I FIND MORE INFORMATION? National Heart, Lung, and Blood Institute: travelstabloid.com   This information is not intended to replace advice given to you by your health care provider. Make sure you discuss any questions you have with your health care provider.   Document Released: 07/27/2011 Document Revised: 08/28/2014 Document Reviewed: 06/11/2013 Elsevier Interactive Patient Education 2016 [employer]. Hypertension Hypertension, commonly called high blood pressure, is when the force of blood pumping through your arteries is too strong. Your arteries are the blood vessels that  carry blood from your heart throughout your body. A blood pressure reading consists of a higher number over a lower number, such as 110/72. The higher number (systolic) is the pressure inside your arteries when your heart pumps. The lower number (diastolic) is the pressure inside your arteries when your heart relaxes. Ideally you want your blood pressure below 120/80. Hypertension forces your heart to work harder to pump blood. Your arteries may become narrow or stiff. Having untreated or uncontrolled hypertension can cause heart attack, stroke, kidney disease, and other problems. RISK FACTORS Some risk factors for high blood pressure are controllable. Others are not.  Risk factors you cannot control include:   Race. You may be at higher risk if you are African American.  Age. Risk increases with age.  Gender. Men are at higher risk than women before age 74 years. After age 7, women are at higher risk than men. Risk factors you can control include:  Not getting enough exercise or physical activity.  Being overweight.  Getting too much fat, sugar, calories, or salt in your diet.  Drinking too much alcohol. SIGNS AND SYMPTOMS Hypertension does not usually cause signs or symptoms. Extremely high blood pressure (hypertensive crisis) may cause headache, anxiety, shortness of breath, and nosebleed. DIAGNOSIS To check if you have hypertension, your health care provider will measure your blood pressure while you are seated, with your arm held at the level of your heart. It should be measured at least twice using the same arm. Certain conditions can cause a difference in blood pressure between your right and left arms. A blood pressure reading that is higher than normal on one occasion does not mean that you need treatment. If it is not clear whether you have high blood pressure, you may be asked to return on a different day to have your blood pressure checked again. Or, you may be asked to monitor  your blood pressure at home for 1 or more weeks. TREATMENT Treating high blood pressure includes making lifestyle changes and possibly taking medicine. Living a healthy lifestyle can help lower high blood pressure. You may need to change some of your habits. Lifestyle changes may include:  Following the DASH diet. This diet is high in fruits, vegetables, and whole grains. It is low in salt, red meat, and added sugars.  Keep your sodium intake below 2,300 mg per day.  Getting at least 30-45 minutes of aerobic exercise at least 4 times per week.  Losing weight if necessary.  Not smoking.  Limiting alcoholic beverages.  Learning ways to reduce stress. Your health care provider may prescribe medicine if lifestyle changes are not enough to get your blood pressure under control, and  if one of the following is true:  You are 42-35 years of age and your systolic blood pressure is above 140.  You are 30 years of age or older, and your systolic blood pressure is above 150.  Your diastolic blood pressure is above 90.  You have diabetes, and your systolic blood pressure is over [id number] or your diastolic blood pressure is over 90.  You have kidney disease and your blood pressure is above 140/90.  You have heart disease and your blood pressure is above 140/90. Your personal target blood pressure may vary depending on your medical conditions, your age, and other factors. HOME CARE INSTRUCTIONS  Have your blood pressure rechecked as directed by your health care provider.   Take medicines only as directed by your health care provider. Follow the directions carefully. Blood pressure medicines must be taken as prescribed. The medicine does not work as well when you skip doses. Skipping doses also puts you at risk for problems.  Do not smoke.   Monitor your blood pressure at home as directed by your health care provider. SEEK MEDICAL CARE IF:   You think you are having a reaction to medicines  taken.  You have recurrent headaches or feel dizzy.  You have swelling in your ankles.  You have trouble with your vision. SEEK IMMEDIATE MEDICAL CARE IF:  You develop a severe headache or confusion.  You have unusual weakness, numbness, or feel faint.  You have severe chest or abdominal pain.  You vomit repeatedly.  You have trouble breathing. MAKE SURE YOU:   Understand these instructions.  Will watch your condition.  Will get help right away if you are not doing well or get worse.   This information is not intended to replace advice given to you by your health care provider. Make sure you discuss any questions you have with your health care provider.   Document Released: 08/07/2005 Document Revised: 12/22/2014 Document Reviewed: 05/30/2013 Elsevier Interactive Patient Education [employer].

## 2015-10-18 NOTE — Assessment & Plan Note (Addendum)
A: Tobacco abuse, she is still smoking  P: -advised cessation, given quitline info  -Dr. Maudie Mercury will help facilitate nicotine patches

## 2015-10-18 NOTE — Progress Notes (Signed)
Patient ID: Daysha Elwart, female   DOB: 28-May-1961, 55 y.o.   MRN: [id number]     Subjective:   Patient ID: Jillean Liva female    DOB: 1961-08-01 55 y.o.    MRN: [id number] Health Maintenance Due: Health Maintenance Due  Topic Date Due  . MAMMOGRAM  10/16/2010  . COLONOSCOPY  10/16/2010  . INFLUENZA VACCINE  03/22/2015    _________________________________________________  HPI: Ms.Izumi Flamenco is a 55 y.o. female here for a routine visit.  Pt has a PMH outlined below.  Please see problem-based charting assessment and plan for further status of patient's chronic medical problems addressed at today's visit.  PMH: Past Medical History  Diagnosis Date  . Hypertension   . Venous stasis ulcers ([place])   . Diastolic heart failure   . Hypokalemia   . Dyslipidemia     pt denies this hx on 05/12/2013  . Anemia   . CHF (congestive heart failure) ([place])   . Chronic bronchitis ([place])     "probably once/yr" (05/12/2013)  . Exertional shortness of breath   . GERD (gastroesophageal reflux disease)     Medications: Current Outpatient Prescriptions on File Prior to Visit  Medication Sig Dispense Refill  . aspirin EC 81 MG EC tablet Take 1 tablet (81 mg total) by mouth daily. 30 tablet 3   No current facility-administered medications on file prior to visit.    Allergies: Allergies  Allergen Reactions  . Lisinopril Cough    FH: Family History  Problem Relation Age of Onset  . Hypertension Mother   . Hypertension Sister   . Diabetes Sister   . Hypertension Brother   . Diabetes Sister     SH: Social History   Social History  . Marital Status: Single    Spouse Name: N/A  . Number of Children: N/A  . Years of Education: N/A   Social History Main Topics  . Smoking status: Current Every Day Smoker -- 0.50 packs/day for 36 years    Types: Cigarettes  . Smokeless tobacco: Never Used     Comment: trying to cut back.  . Alcohol Use: 4.2 oz/week    7 Cans of beer per  week     Comment: Beer once a week.  . Drug Use: No     Comment: 05/12/2013 "tried it a few days ago; didn't like it"  . Sexual Activity: Not on file   Other Topics Concern  . Not on file   Social History Narrative    Review of Systems: Constitutional: Negative for fever, chills and weight loss.  Eyes: +blurred vision.  Respiratory: Negative for cough and shortness of breath.  Cardiovascular: Negative for chest pain, palpitations and leg swelling.  Gastrointestinal: Negative for nausea, vomiting, abdominal pain, diarrhea, constipation and blood in stool.  Genitourinary: Negative for dysuria, urgency and frequency.  Musculoskeletal: Negative for myalgias and back pain.  Neurological: Negative for dizziness, weakness and headaches.     Objective:   Vital Signs: There were no vitals filed for this visit.    BP Readings from Last 3 Encounters:  10/05/15 194/87  06/29/15 174/103  05/12/15 177/93    Physical Exam: Constitutional: Vital signs reviewed.  Patient is in NAD and cooperative with exam.  Head: Normocephalic and atraumatic. Skin: Warm, dry and intact. No rash. Eyes: EOMI, conjunctivae nl, no scleral icterus.  Neck: Supple. Cardiovascular: RRR. Pulmonary/Chest: normal effort, CTAB, no wheezes, rales, or rhonchi. Abdominal: Soft. NT/ND +BS. Neurological: A&O x3, cranial nerves II-XII are grossly  intact, moving all extremities. Extremities: RLE/LLE with TED hose in place. LLE with bandage.   Assessment & Plan:   Assessment and plan was discussed and formulated with my attending.

## 2015-10-18 NOTE — Assessment & Plan Note (Addendum)
-  needs colonoscopy, mammogram, influenza vaccine, but unfortunately has no insurance or orange card; she is supposed to meet with Marlana Latus -advised to meet with Marlana Latus for orange card assistance

## 2015-10-18 NOTE — Assessment & Plan Note (Addendum)
A:  LLE ulcer.  Recently saw Dr. Kellie Simmering for recurrent leg ulcers with a ~2.5cm ulcer on the LLE.  She was instructed to f/u with wound care and advised to quit smoking.  She is not a candidate for ablation.  ABIs OK.  She is going to wound care once a week since last Thursday and completed a 7 day course of doxycycline.  She is wearing TED hose today.   P: -f/u with wound care

## 2015-10-19 ENCOUNTER — Encounter: Payer: Self-pay | Admitting: Internal Medicine

## 2015-10-19 ENCOUNTER — Other Ambulatory Visit: Payer: Self-pay | Admitting: Internal Medicine

## 2015-10-19 DIAGNOSIS — N179 Acute kidney failure, unspecified: Secondary | ICD-10-CM | POA: Insufficient documentation

## 2015-10-19 LAB — CMP14 + ANION GAP
A/G RATIO: 1.2 (ref 1.1–2.5)
ALT: 13 IU/L (ref 0–32)
AST: 18 IU/L (ref 0–40)
Albumin: 3.7 g/dL (ref 3.5–5.5)
Alkaline Phosphatase: 73 IU/L (ref 39–117)
Anion Gap: 22 mmol/L — ABNORMAL HIGH (ref 10.0–18.0)
BUN/Creatinine Ratio: 21 (ref 9–23)
BUN: 24 mg/dL (ref 6–24)
Bilirubin Total: <0.2 mg/dL (ref 0.0–1.2)
CO2: 24 mmol/L (ref 18–29)
Calcium: 8.9 mg/dL (ref 8.7–10.2)
Chloride: 96 mmol/L (ref 96–106)
Creatinine, Ser: 1.13 mg/dL — ABNORMAL HIGH (ref 0.57–1.00)
GFR calc Af Amer: 63 mL/min/{1.73_m2} (ref >59)
GFR, EST NON AFRICAN AMERICAN: 55 mL/min/{1.73_m2} — AB (ref >59)
GLUCOSE: 109 mg/dL — AB (ref 65–99)
Globulin, Total: 3.2 g/dL (ref 1.5–4.5)
POTASSIUM: 3.1 mmol/L — AB (ref 3.5–5.2)
Sodium: 142 mmol/L (ref 134–144)
Total Protein: 6.9 g/dL (ref 6.0–8.5)

## 2015-10-19 LAB — MAGNESIUM: Magnesium: 1.7 mg/dL (ref 1.6–2.3)

## 2015-10-19 LAB — LIPID PANEL
CHOL/HDL RATIO: 5 ratio — AB (ref 0.0–4.4)
CHOLESTEROL TOTAL: 189 mg/dL (ref 100–199)
HDL: 38 mg/dL — ABNORMAL LOW (ref >39)
LDL Calculated: 109 mg/dL — ABNORMAL HIGH (ref 0–99)
TRIGLYCERIDES: 212 mg/dL — AB (ref 0–149)
VLDL Cholesterol Cal: 42 mg/dL — ABNORMAL HIGH (ref 5–40)

## 2015-10-19 NOTE — Assessment & Plan Note (Signed)
A: AKI, she has elevated SCr today likely due to continued ibuprofen use, HTN.  I have discussed stopping ibuprofen and using tylenol instead.  She voices understanding.  I also discussed the dangers of her not being compliant with her BP meds and the CVD risks. P: -f/u in 1-2 weeks to recheck BP and SCr -will need to check UA at next OV -advised to avoid NSAIDS and use tylenol instead

## 2015-10-21 ENCOUNTER — Encounter (HOSPITAL_BASED_OUTPATIENT_CLINIC_OR_DEPARTMENT_OTHER): Payer: Self-pay | Attending: Internal Medicine

## 2015-10-21 DIAGNOSIS — I509 Heart failure, unspecified: Secondary | ICD-10-CM | POA: Insufficient documentation

## 2015-10-21 DIAGNOSIS — I110 Hypertensive heart disease with heart failure: Secondary | ICD-10-CM | POA: Insufficient documentation

## 2015-10-21 DIAGNOSIS — I87332 Chronic venous hypertension (idiopathic) with ulcer and inflammation of left lower extremity: Secondary | ICD-10-CM | POA: Insufficient documentation

## 2015-10-21 DIAGNOSIS — L97821 Non-pressure chronic ulcer of other part of left lower leg limited to breakdown of skin: Secondary | ICD-10-CM | POA: Insufficient documentation

## 2015-10-21 NOTE — Progress Notes (Signed)
Internal Medicine Clinic Attending  Case discussed with Dr. Gill soon after the resident saw the patient.  We reviewed the resident's history and exam and pertinent patient test results.  I agree with the assessment, diagnosis, and plan of care documented in the resident's note.  

## 2015-10-22 ENCOUNTER — Encounter: Payer: Self-pay | Admitting: Internal Medicine

## 2015-10-25 ENCOUNTER — Other Ambulatory Visit: Payer: Self-pay | Admitting: Pharmacist

## 2015-10-25 DIAGNOSIS — I10 Essential (primary) hypertension: Secondary | ICD-10-CM

## 2015-10-25 DIAGNOSIS — E785 Hyperlipidemia, unspecified: Secondary | ICD-10-CM

## 2015-10-25 MED ORDER — ATORVASTATIN CALCIUM 40 MG PO TABS: 40.0000 mg | ORAL_TABLET | Freq: Every day | ORAL | Status: DC

## 2015-10-25 MED ORDER — OLMESARTAN-AMLODIPINE-HCTZ 40-10-25 MG PO TABS: 1.0000 | ORAL_TABLET | Freq: Every day | ORAL | Status: DC

## 2015-10-25 NOTE — Progress Notes (Signed)
Assistance with HTN med mgmt and CV risk reduction. Collaborating with [facility] for 1-time fill, referred patient to financial counselor, all per PCP request/approval. Will continue to collaborate with team in patient's care.

## 2015-10-26 MED FILL — *TRIBENZOR 40/10/25 MG TABL: 40-10-25 | 30 days supply | Qty: 30 | Fill #0

## 2015-10-26 MED FILL — ATORVASTATIN 40 MG TABLET: 40 | 30 days supply | Qty: 30 | Fill #0

## 2015-11-01 ENCOUNTER — Encounter: Payer: Self-pay | Admitting: Internal Medicine

## 2015-11-01 ENCOUNTER — Ambulatory Visit (INDEPENDENT_AMBULATORY_CARE_PROVIDER_SITE_OTHER): Payer: Self-pay | Admitting: Internal Medicine

## 2015-11-01 ENCOUNTER — Ambulatory Visit: Payer: Self-pay | Admitting: Pharmacist

## 2015-11-01 ENCOUNTER — Encounter: Payer: Self-pay | Admitting: Pharmacist

## 2015-11-01 VITALS — BP 140/72 | HR 77 | Temp 98.4°F | Wt 265.6 lb

## 2015-11-01 DIAGNOSIS — Z79899 Other long term (current) drug therapy: Secondary | ICD-10-CM

## 2015-11-01 DIAGNOSIS — I83892 Varicose veins of left lower extremities with other complications: Secondary | ICD-10-CM

## 2015-11-01 DIAGNOSIS — Z719 Counseling, unspecified: Secondary | ICD-10-CM

## 2015-11-01 DIAGNOSIS — I10 Essential (primary) hypertension: Secondary | ICD-10-CM

## 2015-11-01 DIAGNOSIS — N179 Acute kidney failure, unspecified: Secondary | ICD-10-CM

## 2015-11-01 DIAGNOSIS — Z0000 Encounter for general adult medical examination without abnormal findings: Secondary | ICD-10-CM

## 2015-11-01 NOTE — Assessment & Plan Note (Signed)
A: LE wound followed by wound care on Thursdays. P: -cont wound care

## 2015-11-01 NOTE — Progress Notes (Signed)
Patient ID: [name], female   DOB: 01/29/1961, 55 y.o.   MRN: [id number]     Subjective:   Patient ID: [name] female    DOB: 01/13/1961 55 y.o.    MRN: [id number] Health Maintenance Due: Health Maintenance Due  Topic Date Due  . MAMMOGRAM  10/16/2010  . COLONOSCOPY  10/16/2010  . INFLUENZA VACCINE  03/22/2015    _________________________________________________  HPI: [name] is a 55 y.o. female here for a follow up visit for hypertension.  She is feeling sad today due to her best friend passing away recently from a MI.    Pt has a PMH outlined below.  Please see problem-based charting assessment and plan for further status of patient's chronic medical problems addressed at today's visit.  PMH: Past Medical History  Diagnosis Date  . Hypertension   . Venous stasis ulcers ([place])   . Diastolic heart failure   . Hypokalemia   . Dyslipidemia     pt denies this hx on 05/12/2013  . Anemia   . CHF (congestive heart failure) ([place])   . Chronic bronchitis ([place])     "probably once/yr" (05/12/2013)  . Exertional shortness of breath   . GERD (gastroesophageal reflux disease)     Medications: Current Outpatient Prescriptions on File Prior to Visit  Medication Sig Dispense Refill  . aspirin EC 81 MG EC tablet Take 1 tablet (81 mg total) by mouth daily. 30 tablet 3  . atorvastatin (LIPITOR) 40 MG tablet Take 1 tablet (40 mg total) by mouth daily. AttnGerald Stabs or Steve 340B 30 tablet 0  . Olmesartan-Amlodipine-HCTZ 40-10-25 MG TABS Take 1 tablet by mouth daily. AttnGerald Stabs or Richardson Landry 340B 30 tablet 0   No current facility-administered medications on file prior to visit.    Allergies: Allergies  Allergen Reactions  . Lisinopril Cough    FH: Family History  Problem Relation Age of Onset  . Hypertension Mother   . Hypertension Sister   . Diabetes Sister   . Hypertension Brother   . Diabetes Sister     SH: Social History   Social History  .  Marital Status: Single    Spouse Name: N/A  . Number of Children: N/A  . Years of Education: N/A   Social History Main Topics  . Smoking status: Current Every Day Smoker -- 0.40 packs/day for 36 years    Types: Cigarettes  . Smokeless tobacco: Never Used     Comment: trying to cut back.  . Alcohol Use: 4.2 oz/week    7 Cans of beer per week     Comment: Beer once a week.  . Drug Use: No     Comment: 05/12/2013 "tried it a few days ago; didn't like it"  . Sexual Activity: Not Asked   Other Topics Concern  . None   Social History Narrative    Review of Systems: Constitutional: Negative for fever, chills and weight loss.  Eyes: Negative for blurred vision.  Respiratory: Negative for cough and shortness of breath.  Cardiovascular: Negative for chest pain, palpitations. Gastrointestinal: Negative for nausea, vomiting, abdominal pain, diarrhea, constipation and blood in stool.  Genitourinary: Negative for dysuria, urgency and frequency.  Musculoskeletal: Negative for myalgias and back pain.  Neurological: Negative for dizziness, weakness and headaches.     Objective:   Vital Signs: Filed Vitals:   11/01/15 1530  BP: 140/72  Pulse: 77  Temp: 98.4 F (36.9 C)  TempSrc: Oral  Weight: 265 lb 9.6 oz (120.475  kg)  SpO2: 100%      BP Readings from Last 3 Encounters:  11/01/15 140/72  10/22/15 180/70  10/05/15 194/87    Physical Exam: Constitutional: Vital signs reviewed.  Patient is in NAD and cooperative with exam.  Head: Normocephalic and atraumatic. Eyes: EOMI, conjunctivae nl, no scleral icterus.  Neck: Supple. Cardiovascular: RRR, no MRG. Pulmonary/Chest: normal effort, CTAB, no wheezes, rales, or rhonchi. Abdominal: Soft. NT/ND +BS. Neurological: A&O x3, cranial nerves II-XII are grossly intact, moving all extremities.  Assessment & Plan:   Assessment and plan was discussed and formulated with my attending.

## 2015-11-01 NOTE — Assessment & Plan Note (Addendum)
-  refused influenza vaccine (reports getting very sick last time with the vaccine)  -needs colonoscopy but no insurance or orange card-->will meet with Marlana Latus (will call for appt) -given info for mammogram scholarship program

## 2015-11-01 NOTE — Assessment & Plan Note (Addendum)
A: HTN: Initially her BP was 170s/90s.  I rechecked her BP manually and was down to 140/72.  She reports compliance with amlodipine today.  She has picked up her medication from the outpatient pharmacy and will begin this tomorrow. P: -appreciate Dr. Julianne Rice help with her meds -begin combo pill tomorrow olemsartan, amlodipine, HCTZ -will hold off on spironolactone for now given repeat BP -reminded her to take atorvastatin in the evening -f/u in 1 month and repeat BMP

## 2015-11-01 NOTE — Assessment & Plan Note (Signed)
A: AKI P: -repeat BMP at next OV

## 2015-11-01 NOTE — Progress Notes (Addendum)
Physician pharmacist co-visit  Medication(s) were provided from [facility] outpatient pharmacy and reviewed with the patient, including name, instructions, indication, goals of therapy, potential side effects, importance of adherence, and safe use.  Patient states she will be scheduling an appointment with financial counselor for ongoing medication assistance. Will facilitate Rx's with [facility] ([place] [facility] for Tribenzor) once patient is established. Patient also states she is not yet ready to quit smoking due to death of a close friend, but is contemplating/trying to cut back. She plans on joining a smoking cessation class at work. Patient encouraged to continue to pursue. Expressed my condolence for her loss.  Patient verbalized understanding by repeating back information and was advised to contact me if further medication-related questions arise. Patient was also provided an information handout.

## 2015-11-01 NOTE — Progress Notes (Signed)
Medication(s) were provided from [facility] and reviewed with the patient, including name, instructions, indication, goals of therapy, potential side effects, importance of adherence, and safe use.   Patient states she will be scheduling an appointment with financial counselor for ongoing medication assistance. Will facilitate Rx's with [facility] ([place] [facility] for Tribenzor) once patient is established. Patient also states she is not yet ready to quit smoking due to death of a close friend, but is contemplating/trying to cut back. She plans on joining a smoking cessation class at work. Patient encouraged to continue to pursue. Expressed my condolence for her loss.  Patient verbalized understanding by repeating back information and was advised to contact me if further medication-related questions arise. Patient was also provided an information handout.

## 2015-11-01 NOTE — Patient Instructions (Signed)
Thank you for your visit today.   Please return to the internal medicine clinic in 1 month(s) or sooner if needed.     I have made the following additions/changes to your medications:  Continue your current medications. Please be sure to take your atorvastatin in the evening.    Please be sure to bring all of your medications with you to every visit; this includes herbal supplements, vitamins, eye drops, and any over-the-counter medications.   Should you have any questions regarding your medications and/or any new or worsening symptoms, please be sure to call the clinic at [phone].   If you believe that you are suffering from a life threatening condition or one that may result in the loss of limb or function, then you should call 911 and proceed to the nearest Emergency Department.   A healthy lifestyle and preventative care can promote health and wellness.   Maintain regular health, dental, and eye exams.  Eat a healthy diet. Foods like vegetables, fruits, whole grains, low-fat dairy products, and lean protein foods contain the nutrients you need without too many calories. Decrease your intake of foods high in solid fats, added sugars, and salt. Get information about a proper diet from your caregiver, if necessary.  Regular physical exercise is one of the most important things you can do for your health. Most adults should get at least 150 minutes of moderate-intensity exercise (any activity that increases your heart rate and causes you to sweat) each week. In addition, most adults need muscle-strengthening exercises on 2 or more days a week.   Maintain a healthy weight. The body mass index (BMI) is a screening tool to identify possible weight problems. It provides an estimate of body fat based on height and weight. Your caregiver can help determine your BMI, and can help you achieve or maintain a healthy weight. For adults 20 years and older:  A BMI below 18.5 is considered  underweight.  A BMI of 18.5 to 24.9 is normal.  A BMI of 25 to 29.9 is considered overweight.  A BMI of 30 and above is considered obese.  DASH Eating Plan DASH stands for "Dietary Approaches to Stop Hypertension." The DASH eating plan is a healthy eating plan that has been shown to reduce high blood pressure (hypertension). Additional health benefits may include reducing the risk of type 2 diabetes mellitus, heart disease, and stroke. The DASH eating plan may also help with weight loss. WHAT DO I NEED TO KNOW ABOUT THE DASH EATING PLAN? For the DASH eating plan, you will follow these general guidelines:  Choose foods with a percent daily value for sodium of less than 5% (as listed on the food label).  Use salt-free seasonings or herbs instead of table salt or sea salt.  Check with your health care provider or pharmacist before using salt substitutes.  Eat lower-sodium products, often labeled as "lower sodium" or "no salt added."  Eat fresh foods.  Eat more vegetables, fruits, and low-fat dairy products.  Choose whole grains. Look for the word "whole" as the first word in the ingredient list.  Choose fish and skinless chicken or [place] more often than red meat. Limit fish, poultry, and meat to 6 oz (170 g) each day.  Limit sweets, desserts, sugars, and sugary drinks.  Choose heart-healthy fats.  Limit cheese to 1 oz (28 g) per day.  Eat more home-cooked food and less restaurant, buffet, and fast food.  Limit fried foods.  Cook foods using methods  other than frying.  Limit canned vegetables. If you do use them, rinse them well to decrease the sodium.  When eating at a restaurant, ask that your food be prepared with less salt, or no salt if possible. WHAT FOODS CAN I EAT? Seek help from a dietitian for individual calorie needs. Grains Whole grain or whole wheat bread. Brown rice. Whole grain or whole wheat pasta. Quinoa, bulgur, and whole grain cereals. Low-sodium  cereals. Corn or whole wheat flour tortillas. Whole grain cornbread. Whole grain crackers. Low-sodium crackers. Vegetables Fresh or frozen vegetables (raw, steamed, roasted, or grilled). Low-sodium or reduced-sodium tomato and vegetable juices. Low-sodium or reduced-sodium tomato sauce and paste. Low-sodium or reduced-sodium canned vegetables.  Fruits All fresh, canned (in natural juice), or frozen fruits. Meat and Other Protein Products Ground beef (85% or leaner), grass-fed beef, or beef trimmed of fat. Skinless chicken or [place]. Ground chicken or [place]. Pork trimmed of fat. All fish and seafood. Eggs. Dried beans, peas, or lentils. Unsalted nuts and seeds. Unsalted canned beans. Dairy Low-fat dairy products, such as skim or 1% milk, 2% or reduced-fat cheeses, low-fat ricotta or cottage cheese, or plain low-fat yogurt. Low-sodium or reduced-sodium cheeses. Fats and Oils Tub margarines without trans fats. Light or reduced-fat mayonnaise and salad dressings (reduced sodium). Avocado. Safflower, olive, or canola oils. Natural peanut or almond butter. Other Unsalted popcorn and pretzels. The items listed above may not be a complete list of recommended foods or beverages. Contact your dietitian for more options. WHAT FOODS ARE NOT RECOMMENDED? Grains White bread. White pasta. White rice. Refined cornbread. Bagels and croissants. Crackers that contain trans fat. Vegetables Creamed or fried vegetables. Vegetables in a cheese sauce. Regular canned vegetables. Regular canned tomato sauce and paste. Regular tomato and vegetable juices. Fruits Dried fruits. Canned fruit in light or heavy syrup. Fruit juice. Meat and Other Protein Products Fatty cuts of meat. Ribs, chicken wings, bacon, sausage, bologna, salami, chitterlings, fatback, hot dogs, bratwurst, and packaged luncheon meats. Salted nuts and seeds. Canned beans with salt. Dairy Whole or 2% milk, cream, half-and-half, and cream cheese.  Whole-fat or sweetened yogurt. Full-fat cheeses or blue cheese. Nondairy creamers and whipped toppings. Processed cheese, cheese spreads, or cheese curds. Condiments Onion and garlic salt, seasoned salt, table salt, and sea salt. Canned and packaged gravies. Worcestershire sauce. Tartar sauce. Barbecue sauce. Teriyaki sauce. Soy sauce, including reduced sodium. Steak sauce. Fish sauce. Oyster sauce. Cocktail sauce. Horseradish. Ketchup and mustard. Meat flavorings and tenderizers. Bouillon cubes. Hot sauce. Tabasco sauce. Marinades. Taco seasonings. Relishes. Fats and Oils Butter, stick margarine, lard, shortening, ghee, and bacon fat. Coconut, palm kernel, or palm oils. Regular salad dressings. Other Pickles and olives. Salted popcorn and pretzels. The items listed above may not be a complete list of foods and beverages to avoid. Contact your dietitian for more information. WHERE CAN I FIND MORE INFORMATION? National Heart, Lung, and Blood Institute: travelstabloid.com   This information is not intended to replace advice given to you by your health care provider. Make sure you discuss any questions you have with your health care provider.   Document Released: 07/27/2011 Document Revised: 08/28/2014 Document Reviewed: 06/11/2013 Elsevier Interactive Patient Education [employer].

## 2015-11-02 NOTE — Progress Notes (Signed)
Internal Medicine Clinic Attending  Case discussed with Dr. Gill soon after the resident saw the patient.  We reviewed the resident's history and exam and pertinent patient test results.  I agree with the assessment, diagnosis, and plan of care documented in the resident's note.  

## 2015-12-06 ENCOUNTER — Ambulatory Visit: Payer: Self-pay | Admitting: Internal Medicine

## 2015-12-07 ENCOUNTER — Ambulatory Visit: Payer: Self-pay | Admitting: Internal Medicine

## 2015-12-14 ENCOUNTER — Telehealth: Payer: Self-pay | Admitting: Internal Medicine

## 2015-12-14 NOTE — Telephone Encounter (Signed)
APPT. REMINDER CALL, LMTCB °

## 2015-12-15 ENCOUNTER — Ambulatory Visit (INDEPENDENT_AMBULATORY_CARE_PROVIDER_SITE_OTHER): Payer: Self-pay | Admitting: Internal Medicine

## 2015-12-15 ENCOUNTER — Encounter: Payer: Self-pay | Admitting: Internal Medicine

## 2015-12-15 VITALS — BP 157/81 | HR 66 | Temp 98.7°F | Ht 68.0 in | Wt 263.9 lb

## 2015-12-15 DIAGNOSIS — E785 Hyperlipidemia, unspecified: Secondary | ICD-10-CM

## 2015-12-15 DIAGNOSIS — I10 Essential (primary) hypertension: Secondary | ICD-10-CM

## 2015-12-15 MED ORDER — ATORVASTATIN CALCIUM 40 MG PO TABS: 40.0000 mg | ORAL_TABLET | Freq: Every day | ORAL | Status: DC

## 2015-12-15 MED ORDER — OLMESARTAN-AMLODIPINE-HCTZ 40-10-25 MG PO TABS: 1.0000 | ORAL_TABLET | Freq: Every day | ORAL | Status: DC

## 2015-12-15 MED FILL — *TRIBENZOR 40/10/25 MG TABL: 40-10-25 | 30 days supply | Qty: 30 | Fill #0

## 2015-12-15 NOTE — Patient Instructions (Signed)
Thank you for your visit today.   Please return to the internal medicine clinic in about 6-8 weeks to recheck your blood pressure.      I have made the following additions/changes to your medications:  Continue your current medications.  Please see Bonna Gains.   Please be sure to bring all of your medications with you to every visit; this includes herbal supplements, vitamins, eye drops, and any over-the-counter medications.   Should you have any questions regarding your medications and/or any new or worsening symptoms, please be sure to call the clinic at [phone].   If you believe that you are suffering from a life threatening condition or one that may result in the loss of limb or function, then you should call 911 and proceed to the nearest Emergency Department.   A healthy lifestyle and preventative care can promote health and wellness.   Maintain regular health, dental, and eye exams.  Eat a healthy diet. Foods like vegetables, fruits, whole grains, low-fat dairy products, and lean protein foods contain the nutrients you need without too many calories. Decrease your intake of foods high in solid fats, added sugars, and salt. Get information about a proper diet from your caregiver, if necessary.  Regular physical exercise is one of the most important things you can do for your health. Most adults should get at least 150 minutes of moderate-intensity exercise (any activity that increases your heart rate and causes you to sweat) each week. In addition, most adults need muscle-strengthening exercises on 2 or more days a week.   Maintain a healthy weight. The body mass index (BMI) is a screening tool to identify possible weight problems. It provides an estimate of body fat based on height and weight. Your caregiver can help determine your BMI, and can help you achieve or maintain a healthy weight. For adults 20 years and older:  A BMI below 18.5 is considered underweight.  A BMI  of 18.5 to 24.9 is normal.  A BMI of 25 to 29.9 is considered overweight.  A BMI of 30 and above is considered obese.   DASH Eating Plan DASH stands for "Dietary Approaches to Stop Hypertension." The DASH eating plan is a healthy eating plan that has been shown to reduce high blood pressure (hypertension). Additional health benefits may include reducing the risk of type 2 diabetes mellitus, heart disease, and stroke. The DASH eating plan may also help with weight loss. WHAT DO I NEED TO KNOW ABOUT THE DASH EATING PLAN? For the DASH eating plan, you will follow these general guidelines:  Choose foods with a percent daily value for sodium of less than 5% (as listed on the food label).  Use salt-free seasonings or herbs instead of table salt or sea salt.  Check with your health care provider or pharmacist before using salt substitutes.  Eat lower-sodium products, often labeled as "lower sodium" or "no salt added."  Eat fresh foods.  Eat more vegetables, fruits, and low-fat dairy products.  Choose whole grains. Look for the word "whole" as the first word in the ingredient list.  Choose fish and skinless chicken or [place] more often than red meat. Limit fish, poultry, and meat to 6 oz (170 g) each day.  Limit sweets, desserts, sugars, and sugary drinks.  Choose heart-healthy fats.  Limit cheese to 1 oz (28 g) per day.  Eat more home-cooked food and less restaurant, buffet, and fast food.  Limit fried foods.  Cook foods using methods other than  frying.  Limit canned vegetables. If you do use them, rinse them well to decrease the sodium.  When eating at a restaurant, ask that your food be prepared with less salt, or no salt if possible. WHAT FOODS CAN I EAT? Seek help from a dietitian for individual calorie needs. Grains Whole grain or whole wheat bread. Brown rice. Whole grain or whole wheat pasta. Quinoa, bulgur, and whole grain cereals. Low-sodium cereals. Corn or whole  wheat flour tortillas. Whole grain cornbread. Whole grain crackers. Low-sodium crackers. Vegetables Fresh or frozen vegetables (raw, steamed, roasted, or grilled). Low-sodium or reduced-sodium tomato and vegetable juices. Low-sodium or reduced-sodium tomato sauce and paste. Low-sodium or reduced-sodium canned vegetables.  Fruits All fresh, canned (in natural juice), or frozen fruits. Meat and Other Protein Products Ground beef (85% or leaner), grass-fed beef, or beef trimmed of fat. Skinless chicken or [place]. Ground chicken or [place]. Pork trimmed of fat. All fish and seafood. Eggs. Dried beans, peas, or lentils. Unsalted nuts and seeds. Unsalted canned beans. Dairy Low-fat dairy products, such as skim or 1% milk, 2% or reduced-fat cheeses, low-fat ricotta or cottage cheese, or plain low-fat yogurt. Low-sodium or reduced-sodium cheeses. Fats and Oils Tub margarines without trans fats. Light or reduced-fat mayonnaise and salad dressings (reduced sodium). Avocado. Safflower, olive, or canola oils. Natural peanut or almond butter. Other Unsalted popcorn and pretzels. The items listed above may not be a complete list of recommended foods or beverages. Contact your dietitian for more options. WHAT FOODS ARE NOT RECOMMENDED? Grains White bread. White pasta. White rice. Refined cornbread. Bagels and croissants. Crackers that contain trans fat. Vegetables Creamed or fried vegetables. Vegetables in a cheese sauce. Regular canned vegetables. Regular canned tomato sauce and paste. Regular tomato and vegetable juices. Fruits Dried fruits. Canned fruit in light or heavy syrup. Fruit juice. Meat and Other Protein Products Fatty cuts of meat. Ribs, chicken wings, bacon, sausage, bologna, salami, chitterlings, fatback, hot dogs, bratwurst, and packaged luncheon meats. Salted nuts and seeds. Canned beans with salt. Dairy Whole or 2% milk, cream, half-and-half, and cream cheese. Whole-fat or sweetened yogurt.  Full-fat cheeses or blue cheese. Nondairy creamers and whipped toppings. Processed cheese, cheese spreads, or cheese curds. Condiments Onion and garlic salt, seasoned salt, table salt, and sea salt. Canned and packaged gravies. Worcestershire sauce. Tartar sauce. Barbecue sauce. Teriyaki sauce. Soy sauce, including reduced sodium. Steak sauce. Fish sauce. Oyster sauce. Cocktail sauce. Horseradish. Ketchup and mustard. Meat flavorings and tenderizers. Bouillon cubes. Hot sauce. Tabasco sauce. Marinades. Taco seasonings. Relishes. Fats and Oils Butter, stick margarine, lard, shortening, ghee, and bacon fat. Coconut, palm kernel, or palm oils. Regular salad dressings. Other Pickles and olives. Salted popcorn and pretzels. The items listed above may not be a complete list of foods and beverages to avoid. Contact your dietitian for more information. WHERE CAN I FIND MORE INFORMATION? National Heart, Lung, and Blood Institute: travelstabloid.com   This information is not intended to replace advice given to you by your health care provider. Make sure you discuss any questions you have with your health care provider.   Document Released: 07/27/2011 Document Revised: 08/28/2014 Document Reviewed: 06/11/2013 Elsevier Interactive Patient Education [employer].

## 2015-12-15 NOTE — Progress Notes (Signed)
Patient ID: [name], female   DOB: 08-02-1961, 55 y.o.   MRN: [id number]     Subjective:   Patient ID: [name] female    DOB: 02-12-1961 55 y.o.    MRN: [id number] Health Maintenance Due: Health Maintenance Due  Topic Date Due  . MAMMOGRAM  10/16/2010  . COLONOSCOPY  10/16/2010    _________________________________________________  HPI: [name] is a 55 y.o. female here for a BP f/u.  Pt has a PMH outlined below.  Please see problem-based charting assessment and plan for further status of patient's chronic medical problems addressed at today's visit.  PMH: Past Medical History  Diagnosis Date  . Hypertension   . Venous stasis ulcers ([place])   . Diastolic heart failure   . Hypokalemia   . Dyslipidemia     pt denies this hx on 05/12/2013  . Anemia   . CHF (congestive heart failure) ([place])   . Chronic bronchitis ([place])     "probably once/yr" (05/12/2013)  . Exertional shortness of breath   . GERD (gastroesophageal reflux disease)     Medications: Current Outpatient Prescriptions on File Prior to Visit  Medication Sig Dispense Refill  . aspirin EC 81 MG EC tablet Take 1 tablet (81 mg total) by mouth daily. 30 tablet 3  . atorvastatin (LIPITOR) 40 MG tablet Take 1 tablet (40 mg total) by mouth daily. AttnGerald Stabs or Steve 340B 30 tablet 0  . Olmesartan-Amlodipine-HCTZ 40-10-25 MG TABS Take 1 tablet by mouth daily. AttnGerald Stabs or Richardson Landry 340B 30 tablet 0   No current facility-administered medications on file prior to visit.    Allergies: Allergies  Allergen Reactions  . Lisinopril Cough    FH: Family History  Problem Relation Age of Onset  . Hypertension Mother   . Hypertension Sister   . Diabetes Sister   . Hypertension Brother   . Diabetes Sister     SH: Social History   Social History  . Marital Status: Single    Spouse Name: N/A  . Number of Children: N/A  . Years of Education: N/A   Social History Main Topics  . Smoking status:  Current Every Day Smoker -- 0.40 packs/day for 36 years    Types: Cigarettes  . Smokeless tobacco: Never Used     Comment: trying to cut back.  . Alcohol Use: 4.2 oz/week    7 Cans of beer per week     Comment: Beer once a week.  . Drug Use: No     Comment: 05/12/2013 "tried it a few days ago; didn't like it"  . Sexual Activity: Not Asked   Other Topics Concern  . None   Social History Narrative    Review of Systems: Constitutional: Negative for fever, chills.  Eyes: Negative for blurred vision.  Respiratory: Negative for cough and shortness of breath.  Cardiovascular: Negative for chest pain.  Gastrointestinal: Negative for nausea, vomiting. Neurological: Negative for dizziness.   Objective:   Vital Signs: Filed Vitals:   12/15/15 1102  BP: 169/74  Pulse: 78  Temp: 98.7 F (37.1 C)  TempSrc: Oral  Height: 5\' 8"  (1.727 m)  Weight: 263 lb 14.4 oz (119.704 kg)  SpO2: 100%      BP Readings from Last 3 Encounters:  12/15/15 169/74  11/01/15 140/72  10/22/15 180/70    Physical Exam: Constitutional: Vital signs reviewed.  Patient is in NAD and cooperative with exam.  Head: Normocephalic and atraumatic. Eyes: EOMI, conjunctivae nl, no scleral icterus.  Neck: Supple. Cardiovascular: RRR, no MRG. Pulmonary/Chest: normal effort, CTAB, no wheezes, rales, or rhonchi. Neurological: A&O x3, cranial nerves II-XII are grossly intact, moving all extremities.   Assessment & Plan:   Assessment and plan was discussed and formulated with my attending.

## 2015-12-15 NOTE — Progress Notes (Signed)
Case discussed with Dr. Gill soon after the resident saw the patient.  We reviewed the resident's history and exam and pertinent patient test results.  I agree with the assessment, diagnosis, and plan of care documented in the resident's note. 

## 2015-12-15 NOTE — Addendum Note (Signed)
Addended by: Forde Dandy on: 12/15/2015 03:17 PM   Modules accepted: Orders

## 2015-12-15 NOTE — Assessment & Plan Note (Addendum)
Pt here for a recheck of her BP.  Today 169/74.  She ran out of her meds yesterday.  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   -cont current meds -cont DASH diet  -refilled

## 2015-12-21 ENCOUNTER — Telehealth: Payer: Self-pay | Admitting: Internal Medicine

## 2015-12-21 NOTE — Telephone Encounter (Signed)
APPT. REMINDER CALL, LMTCB °

## 2015-12-22 ENCOUNTER — Ambulatory Visit: Payer: Self-pay

## 2016-01-14 ENCOUNTER — Telehealth: Payer: Self-pay | Admitting: Internal Medicine

## 2016-01-14 NOTE — Telephone Encounter (Signed)
APT. REMINDER CALL, LMTCB °

## 2016-01-18 ENCOUNTER — Ambulatory Visit: Payer: Self-pay | Admitting: Internal Medicine

## 2016-01-18 ENCOUNTER — Encounter: Payer: Self-pay | Admitting: Internal Medicine

## 2016-01-28 ENCOUNTER — Encounter: Payer: Self-pay | Admitting: *Deleted

## 2016-02-29 MED FILL — *TRIBENZOR 40/10/25 MG TABL: 40-10-25 | 30 days supply | Qty: 30 | Fill #1

## 2016-06-08 ENCOUNTER — Encounter (HOSPITAL_BASED_OUTPATIENT_CLINIC_OR_DEPARTMENT_OTHER): Payer: Self-pay

## 2016-06-08 ENCOUNTER — Encounter (HOSPITAL_BASED_OUTPATIENT_CLINIC_OR_DEPARTMENT_OTHER): Payer: Self-pay | Attending: Internal Medicine

## 2016-06-08 DIAGNOSIS — I87331 Chronic venous hypertension (idiopathic) with ulcer and inflammation of right lower extremity: Secondary | ICD-10-CM | POA: Insufficient documentation

## 2016-06-08 DIAGNOSIS — L97811 Non-pressure chronic ulcer of other part of right lower leg limited to breakdown of skin: Secondary | ICD-10-CM | POA: Insufficient documentation

## 2016-06-08 DIAGNOSIS — I509 Heart failure, unspecified: Secondary | ICD-10-CM | POA: Insufficient documentation

## 2016-06-08 DIAGNOSIS — I110 Hypertensive heart disease with heart failure: Secondary | ICD-10-CM | POA: Insufficient documentation

## 2016-06-22 ENCOUNTER — Encounter (HOSPITAL_BASED_OUTPATIENT_CLINIC_OR_DEPARTMENT_OTHER): Payer: Self-pay | Attending: Internal Medicine

## 2016-06-22 DIAGNOSIS — I10 Essential (primary) hypertension: Secondary | ICD-10-CM | POA: Insufficient documentation

## 2016-06-22 DIAGNOSIS — F17200 Nicotine dependence, unspecified, uncomplicated: Secondary | ICD-10-CM | POA: Insufficient documentation

## 2016-06-22 DIAGNOSIS — I739 Peripheral vascular disease, unspecified: Secondary | ICD-10-CM | POA: Insufficient documentation

## 2016-06-22 DIAGNOSIS — I87331 Chronic venous hypertension (idiopathic) with ulcer and inflammation of right lower extremity: Secondary | ICD-10-CM | POA: Insufficient documentation

## 2016-06-22 DIAGNOSIS — L97812 Non-pressure chronic ulcer of other part of right lower leg with fat layer exposed: Secondary | ICD-10-CM | POA: Insufficient documentation

## 2016-06-22 DIAGNOSIS — L97511 Non-pressure chronic ulcer of other part of right foot limited to breakdown of skin: Secondary | ICD-10-CM | POA: Insufficient documentation

## 2016-08-03 ENCOUNTER — Encounter (HOSPITAL_BASED_OUTPATIENT_CLINIC_OR_DEPARTMENT_OTHER): Payer: Self-pay | Attending: Internal Medicine

## 2016-08-03 DIAGNOSIS — L97811 Non-pressure chronic ulcer of other part of right lower leg limited to breakdown of skin: Secondary | ICD-10-CM | POA: Insufficient documentation

## 2016-08-03 DIAGNOSIS — I509 Heart failure, unspecified: Secondary | ICD-10-CM | POA: Insufficient documentation

## 2016-08-03 DIAGNOSIS — I87331 Chronic venous hypertension (idiopathic) with ulcer and inflammation of right lower extremity: Secondary | ICD-10-CM | POA: Insufficient documentation

## 2016-08-03 DIAGNOSIS — I739 Peripheral vascular disease, unspecified: Secondary | ICD-10-CM | POA: Insufficient documentation

## 2016-08-03 DIAGNOSIS — I110 Hypertensive heart disease with heart failure: Secondary | ICD-10-CM | POA: Insufficient documentation

## 2016-08-03 DIAGNOSIS — L97511 Non-pressure chronic ulcer of other part of right foot limited to breakdown of skin: Secondary | ICD-10-CM | POA: Insufficient documentation

## 2016-08-31 ENCOUNTER — Encounter (HOSPITAL_BASED_OUTPATIENT_CLINIC_OR_DEPARTMENT_OTHER): Payer: Self-pay | Attending: Internal Medicine

## 2016-08-31 DIAGNOSIS — I110 Hypertensive heart disease with heart failure: Secondary | ICD-10-CM | POA: Insufficient documentation

## 2016-08-31 DIAGNOSIS — I10 Essential (primary) hypertension: Secondary | ICD-10-CM | POA: Insufficient documentation

## 2016-08-31 DIAGNOSIS — I87331 Chronic venous hypertension (idiopathic) with ulcer and inflammation of right lower extremity: Secondary | ICD-10-CM | POA: Insufficient documentation

## 2016-08-31 DIAGNOSIS — F17200 Nicotine dependence, unspecified, uncomplicated: Secondary | ICD-10-CM | POA: Insufficient documentation

## 2016-08-31 DIAGNOSIS — I5030 Unspecified diastolic (congestive) heart failure: Secondary | ICD-10-CM | POA: Insufficient documentation

## 2016-08-31 DIAGNOSIS — I739 Peripheral vascular disease, unspecified: Secondary | ICD-10-CM | POA: Insufficient documentation

## 2016-08-31 DIAGNOSIS — L97819 Non-pressure chronic ulcer of other part of right lower leg with unspecified severity: Secondary | ICD-10-CM | POA: Insufficient documentation

## 2016-09-21 ENCOUNTER — Encounter (HOSPITAL_BASED_OUTPATIENT_CLINIC_OR_DEPARTMENT_OTHER): Payer: Self-pay | Attending: Internal Medicine

## 2016-09-21 DIAGNOSIS — I10 Essential (primary) hypertension: Secondary | ICD-10-CM | POA: Insufficient documentation

## 2016-09-21 DIAGNOSIS — Z09 Encounter for follow-up examination after completed treatment for conditions other than malignant neoplasm: Secondary | ICD-10-CM | POA: Insufficient documentation

## 2016-09-21 DIAGNOSIS — Z872 Personal history of diseases of the skin and subcutaneous tissue: Secondary | ICD-10-CM | POA: Insufficient documentation

## 2016-12-20 ENCOUNTER — Ambulatory Visit (INDEPENDENT_AMBULATORY_CARE_PROVIDER_SITE_OTHER): Payer: Medicaid Other | Admitting: Internal Medicine

## 2016-12-20 VITALS — BP 198/107 | HR 75 | Temp 98.8°F | Ht 68.0 in | Wt 244.1 lb

## 2016-12-20 DIAGNOSIS — Z79899 Other long term (current) drug therapy: Secondary | ICD-10-CM | POA: Diagnosis not present

## 2016-12-20 DIAGNOSIS — I10 Essential (primary) hypertension: Secondary | ICD-10-CM

## 2016-12-20 DIAGNOSIS — R011 Cardiac murmur, unspecified: Secondary | ICD-10-CM | POA: Diagnosis not present

## 2016-12-20 DIAGNOSIS — F17210 Nicotine dependence, cigarettes, uncomplicated: Secondary | ICD-10-CM

## 2016-12-20 LAB — POCT GLYCOSYLATED HEMOGLOBIN (HGB A1C): Hemoglobin A1C: 5

## 2016-12-20 LAB — GLUCOSE, CAPILLARY: Glucose-Capillary: 83 mg/dL (ref 65–99)

## 2016-12-20 MED ORDER — OLMESARTAN-AMLODIPINE-HCTZ 40-10-25 MG PO TABS: 1.0000 | ORAL_TABLET | Freq: Every day | ORAL | 1 refills | Status: DC

## 2016-12-20 NOTE — Assessment & Plan Note (Signed)
Vitals:   12/20/16 0957 12/20/16 1038  BP: (!) 216/110 (!) 198/107  Pulse: 82 75  Temp: 98.8 F (37.1 C)    Patient has not been seen since 12/2015, has hx of severe uncontrolled HTN, in the past BP has been 190-200, last visit 280 systolic. On olmesartan-amlodipine-hctz 40-10-25 mg daily. Stop taking her med 4 months ago, stated "I was crazy". Denies any sx such as chest pain, SOB, n/v, numbness, dizziness, HA.   -will restart olmesartan-amlodipine-hctz 40-10-25 mg daily. -BMET and hgba1c for screening today - f/up in 2 weeks.

## 2016-12-20 NOTE — Patient Instructions (Signed)
Restart your medication.  Will check your blood work today.  Follow up in 2 weeks.

## 2016-12-20 NOTE — Progress Notes (Signed)
Internal Medicine Clinic Attending  Case discussed with Dr. Ahmed at the time of the visit.  We reviewed the resident's history and exam and pertinent patient test results.  I agree with the assessment, diagnosis, and plan of care documented in the resident's note. 

## 2016-12-20 NOTE — Progress Notes (Signed)
   CC: HTN f/up   HPI:  [name] is a 56 y.o. with pmh as listed below is here for HTN f/up  Past Medical History:  Diagnosis Date  . Anemia   . CHF (congestive heart failure) ([place])   . Chronic bronchitis ([place])    "probably once/yr" (05/12/2013)  . Diastolic heart failure   . Dyslipidemia    pt denies this hx on 05/12/2013  . Exertional shortness of breath   . GERD (gastroesophageal reflux disease)   . Hypertension   . Hypokalemia   . Venous stasis ulcers ([place])    Patient has not been seen since 12/2015, has hx of severe uncontrolled HTN, in the past BP has been 190-200, last visit 017 systolic. On olmesartan-amlodipine-hctz 40-10-25 mg daily. Stop taking her med 4 months ago, stated "I was crazy". Denies any sx such as chest pain, SOB, n/v, numbness, dizziness, HA.   Review of Systems:   Review of Systems  Constitutional: Negative for chills and fever.  Cardiovascular: Negative for chest pain and palpitations.  Neurological: Negative for dizziness and headaches.     Physical Exam:  Vitals:   12/20/16 0957 12/20/16 1038  BP: (!) 216/110 (!) 198/107  Pulse: 82 75  Temp: 98.8 F (37.1 C)   TempSrc: Oral   SpO2: 100%   Weight: 244 lb 1.6 oz (110.7 kg)    Physical Exam  Constitutional: She is oriented to person, place, and time. She appears well-developed and well-nourished. No distress.  HENT:  Head: Normocephalic and atraumatic.  Eyes: Conjunctivae are normal.  Cardiovascular: Normal rate and regular rhythm.   Systolic murmur.   Respiratory: Effort normal and breath sounds normal. No respiratory distress. She has no wheezes.  GI: Soft. Bowel sounds are normal.  Musculoskeletal:  1+ edema on both legs.   Neurological: She is alert and oriented to person, place, and time.  Skin: She is not diaphoretic.    Assessment & Plan:   See Encounters Tab for problem based charting.  Patient discussed with Dr. Dareen Piano

## 2016-12-21 LAB — BASIC METABOLIC PANEL
BUN / CREAT RATIO: 13 (ref 9–23)
BUN: 15 mg/dL (ref 6–24)
CO2: 28 mmol/L (ref 18–29)
Calcium: 8.9 mg/dL (ref 8.7–10.2)
Chloride: 96 mmol/L (ref 96–106)
Creatinine, Ser: 1.2 mg/dL — ABNORMAL HIGH (ref 0.57–1.00)
GFR calc Af Amer: 58 mL/min/{1.73_m2} — ABNORMAL LOW (ref >59)
GFR, EST NON AFRICAN AMERICAN: 51 mL/min/{1.73_m2} — AB (ref >59)
Glucose: 74 mg/dL (ref 65–99)
POTASSIUM: 3.1 mmol/L — AB (ref 3.5–5.2)
SODIUM: 142 mmol/L (ref 134–144)

## 2016-12-22 ENCOUNTER — Other Ambulatory Visit: Payer: Self-pay | Admitting: Internal Medicine

## 2016-12-22 MED ORDER — HYDROCHLOROTHIAZIDE 25 MG PO TABS: 25.0000 mg | ORAL_TABLET | Freq: Every day | ORAL | 3 refills | Status: DC

## 2016-12-22 MED ORDER — AMLODIPINE BESYLATE 10 MG PO TABS: 10.0000 mg | ORAL_TABLET | Freq: Every day | ORAL | 3 refills | Status: DC

## 2016-12-22 NOTE — Telephone Encounter (Addendum)
Amlodipine and HCTZ rxs called to [facility]; pt called - no answer, mailbox is full, unable to leave message.

## 2016-12-22 NOTE — Telephone Encounter (Signed)
[facility] said pt has Medicaid and Olmesartan-Amlodipine-HCTZ 40-10 not covered; she did not think it covers combination pills.  Pt stated she has not taken any med in 2 days. Please change and send new rx to [facility]. Thanks

## 2016-12-22 NOTE — Telephone Encounter (Signed)
Please make sure she has a f/u as planned in Dr. Brandt Loosen note for ~ 2 weeks in the [facility]

## 2016-12-22 NOTE — Telephone Encounter (Signed)
Pt states her Medicaid will not pay for her  Olmesartan-Amlodipine-HCTZ 40-10-25 MG   Medication.  Please call patient back.

## 2016-12-22 NOTE — Telephone Encounter (Signed)
Pt has an appt 01/03/17 in [facility].

## 2016-12-22 NOTE — Telephone Encounter (Signed)
Will send in amlodipine and hctz as single pills now.  She was just restarted on this medication after reported being out for 4 months.    Plan Start amlodpine 10mg  daily Start HCTZ 25mg  daily Reviewed Dr. Brandt Loosen note from 5/2

## 2016-12-22 NOTE — Telephone Encounter (Signed)
Please call into pharmacy

## 2017-01-03 ENCOUNTER — Encounter: Payer: Self-pay | Admitting: Internal Medicine

## 2017-01-03 ENCOUNTER — Ambulatory Visit (INDEPENDENT_AMBULATORY_CARE_PROVIDER_SITE_OTHER): Payer: Medicaid Other | Admitting: Internal Medicine

## 2017-01-03 VITALS — BP 176/93 | HR 90 | Temp 98.4°F | Resp 20 | Ht 68.0 in | Wt 244.1 lb

## 2017-01-03 DIAGNOSIS — F17210 Nicotine dependence, cigarettes, uncomplicated: Secondary | ICD-10-CM

## 2017-01-03 DIAGNOSIS — Z79899 Other long term (current) drug therapy: Secondary | ICD-10-CM | POA: Diagnosis not present

## 2017-01-03 DIAGNOSIS — I10 Essential (primary) hypertension: Secondary | ICD-10-CM | POA: Diagnosis not present

## 2017-01-03 MED ORDER — VALSARTAN 80 MG PO TABS: 80.0000 mg | ORAL_TABLET | Freq: Every day | ORAL | 1 refills | Status: DC

## 2017-01-03 NOTE — Progress Notes (Signed)
   CC: Hypertension management follow-up HPI: [name] is a 56 y.o. female with a past medical history as listed below who presents to follow-up on her hypertension after medication adjustments were made in clinic earlier this month.  Past Medical History:  Diagnosis Date  . Anemia   . CHF (congestive heart failure) ([place])   . Chronic bronchitis ([place])    "probably once/yr" (05/12/2013)  . Diastolic heart failure   . Dyslipidemia    pt denies this hx on 05/12/2013  . Exertional shortness of breath   . GERD (gastroesophageal reflux disease)   . Hypertension   . Hypokalemia   . Venous stasis ulcers ([facility])      Review of Systems: Endorses some fatigue. Denies cough and shortness of breath. Denies chest pain. Denies dysuria.  Physical Exam: There were no vitals filed for this visit. Head: Normocephalic, without obvious abnormality, atraumatic Lungs: clear to auscultation bilaterally Heart: Grade 3/6 holosystolic murmur, RRR Abdomen: soft, non-tender; bowel sounds normal; no masses,  no organomegaly Extremities: Bilateral lower extrimity edema  Assessment & Plan:  See encounters tab for problem based medical decision making. Patient discussed with Dr. Angelia Mould  Signed: Ophelia Shoulder, MD 01/03/2017, 10:41 AM  Pager: [phone]

## 2017-01-03 NOTE — Assessment & Plan Note (Addendum)
Vitals:   01/03/17 1039  BP: (!) 176/93  Pulse: 90  Resp: 20  Temp: 98.4 F (36.9 C)   Patient returns for follow-up of severe uncontrolled essential hypertension. At her previous visit on 12/20/2016 her blood pressure was 216/110. At that time she was started on amlodipine 10 mg once daily and hydrochlorothiazide 25 mg once daily. At her visit today her blood pressure has improved as documented above however she is still not at goal. Today I will start valsartan 80 mg once daily and continue her other 2 antihypertensive medications. We'll have her return to clinic in 2 weeks for ongoing assessment. If her blood pressure is controlled on these 3 medications I would encourage that they be consolidated into a single pill. We will not consolidate now as we have not completed titrating her antihypertensive medications appropriately. Additionally, on review of BMPs in the past the patient has been slightly hypokalemic. We will order plasma aldosterone and renin levels to workup for secondary causes of hypertension such as hyperaldosteronism. We will also repeat a basic metabolic panel today to ensure that her potassium has not further dropped after starting hydrochlorothiazide which can waste potassium. -- Continue hydrochlorothiazide 25 mg once daily -- Continue amlodipine 10 mg once daily -- Start valsartan 80 mg once daily -- Basic metabolic panel, aldosterone and renin activity with ratio -- Return to clinic in 2 weeks -- If her blood pressure is adequately controlled on this combination of antihypertensive medications at her follow-up visit please consider consolidating these into a single oral formulation to increase adherence.

## 2017-01-03 NOTE — Patient Instructions (Signed)
It was a pleasure seeing you today. Thank you for choosing [facility] for your healthcare needs.   Please continue the amlodipine and hydrochlorothiazide. I have added another medicine called valsartan. Please take this once a day with your other 2 medications.  I will also do blood work today to ensure that your electrolytes are normal.  Please return to clinic in 2 weeks. If your blood pressure has improved we will consider combining these into a single pill.

## 2017-01-04 ENCOUNTER — Telehealth: Payer: Self-pay | Admitting: Internal Medicine

## 2017-01-04 ENCOUNTER — Telehealth: Payer: Self-pay | Admitting: *Deleted

## 2017-01-04 LAB — BMP8+ANION GAP
ANION GAP: 16 mmol/L (ref 10.0–18.0)
BUN/Creatinine Ratio: 14 (ref 9–23)
BUN: 20 mg/dL (ref 6–24)
CO2: 31 mmol/L — ABNORMAL HIGH (ref 18–29)
Calcium: 9.1 mg/dL (ref 8.7–10.2)
Chloride: 93 mmol/L — ABNORMAL LOW (ref 96–106)
Creatinine, Ser: 1.47 mg/dL — ABNORMAL HIGH (ref 0.57–1.00)
GFR calc Af Amer: 46 mL/min/{1.73_m2} — ABNORMAL LOW (ref >59)
GFR, EST NON AFRICAN AMERICAN: 40 mL/min/{1.73_m2} — AB (ref >59)
GLUCOSE: 93 mg/dL (ref 65–99)
POTASSIUM: 3.2 mmol/L — AB (ref 3.5–5.2)
Sodium: 140 mmol/L (ref 134–144)

## 2017-01-04 NOTE — Telephone Encounter (Signed)
I have attempted to call the patient and tell her her creatinine has worsened. I want her to hold the hydrochlorothiazide and valsartan. I also want her to return to clinic next week for repeat basic metabolic panel. I have been unable to reach her. She does not have a voice mailbox which is set up. I sent a note to the office staff to try and reach out to her and inform her of this.

## 2017-01-04 NOTE — Telephone Encounter (Signed)
Tried to call pt, no answer and vmail full, will try again

## 2017-01-04 NOTE — Telephone Encounter (Signed)
-----   Message from Meriam Sprague sent at 01/04/2017  1:32 PM EDT ----- Please call patient for instructions listed below by Dr. Lovena Le.  Asked her what day and time she wants to come in labs and just let me know.  Thanks,  [place] ----- Message ----- From: Ophelia Shoulder, MD Sent: 01/04/2017   9:17 AM To: Imp Front Desk Pool  I cant get in touch with [name].  I need her to stop taking her HCTZ and NOT to start the Valsartan. She also needs an appointment to be seen early next week to repeat labs because her kidney function has worsened.   Would someone be able to try and get in touch with her? ] Thanks, Jeneen Rinks

## 2017-01-05 NOTE — Progress Notes (Signed)
Internal Medicine Clinic Attending  Case discussed with Dr. Taylor at the time of the visit.  We reviewed the resident's history and exam and pertinent patient test results.  I agree with the assessment, diagnosis, and plan of care documented in the resident's note. 

## 2017-01-07 LAB — ALDOSTERONE + RENIN ACTIVITY W/ RATIO
ALDOS/RENIN RATIO: 63.2 — ABNORMAL HIGH (ref 0.0–30.0)
ALDOSTERONE: 15.3 ng/dL (ref 0.0–30.0)
RENIN: 0.242 ng/mL/h (ref 0.167–5.380)

## 2017-01-08 NOTE — Telephone Encounter (Signed)
Called pt - stated she had talked to Dr Lovena Le; knows to stop HCTZ and has an appt on Thursday in [facility].

## 2017-01-11 ENCOUNTER — Encounter: Payer: Self-pay | Admitting: Internal Medicine

## 2017-01-11 ENCOUNTER — Ambulatory Visit: Payer: Medicaid Other

## 2017-01-17 ENCOUNTER — Ambulatory Visit (INDEPENDENT_AMBULATORY_CARE_PROVIDER_SITE_OTHER): Payer: Medicaid Other | Admitting: Internal Medicine

## 2017-01-17 ENCOUNTER — Encounter: Payer: Self-pay | Admitting: Internal Medicine

## 2017-01-17 VITALS — BP 181/94 | HR 90 | Temp 98.6°F | Ht 68.0 in | Wt 243.0 lb

## 2017-01-17 DIAGNOSIS — E269 Hyperaldosteronism, unspecified: Secondary | ICD-10-CM

## 2017-01-17 DIAGNOSIS — E2609 Other primary hyperaldosteronism: Secondary | ICD-10-CM

## 2017-01-17 DIAGNOSIS — I152 Hypertension secondary to endocrine disorders: Secondary | ICD-10-CM

## 2017-01-17 DIAGNOSIS — E876 Hypokalemia: Secondary | ICD-10-CM

## 2017-01-17 DIAGNOSIS — F17210 Nicotine dependence, cigarettes, uncomplicated: Secondary | ICD-10-CM | POA: Diagnosis not present

## 2017-01-17 MED ORDER — SPIRONOLACTONE 25 MG PO TABS
25.0000 mg | ORAL_TABLET | Freq: Every day | ORAL | 0 refills | Status: DC
Start: 2017-01-17 — End: 2017-02-12

## 2017-01-17 NOTE — Patient Instructions (Signed)
It was a pleasure seeing you today. Thank you for choosing [facility] for your healthcare needs.   You have been diagnosed with a condition called hyperaldosteronism. This is most likely causing her high blood pressure.  To treat this I have started a new medication called spironolactone. You are to take this medicine daily. Along with this medicine continue to take Norvasc.  I will order a CT scan of your adrenal glands.  Lastly, please return to clinic in 2 weeks for lab work.

## 2017-01-17 NOTE — Progress Notes (Signed)
Internal Medicine Clinic Attending  Case discussed with Dr. Taylor at the time of the visit.  We reviewed the resident's history and exam and pertinent patient test results.  I agree with the assessment, diagnosis, and plan of care documented in the resident's note. 

## 2017-01-17 NOTE — Assessment & Plan Note (Signed)
A workup for hyperaldosteronism was initiated secondary the patient's difficult to control hypertension and chronic hypokalemia.Laboratory evaluation came back positive for hyperaldosteronism with a plasma aldosterone level greater than 15 at 15.3 and a decreased renin level at 0.242. Plasma aldosterone renin ratio of 63.2. Although her aldosterone level was only slightly elevated above 15 given the total sum of evidence including her renin level, aldosterone renin ratio, difficult to manage hypertension and chronic hypokalemia the patient fits criteria for hypertension secondary to hyperaldosteronism. We will adjust her hypertension management as documented under "hypertension due to endocrine disorder". We will also proceed with initial imaging studies to evaluate the adrenals with bilateral adrenal CT. -- Adrenal CT -- Follow-up to clinic in 2 weeks for repeat basic metabolic panel

## 2017-01-17 NOTE — Assessment & Plan Note (Addendum)
Patient has had difficult to manage hypertension requiring 3 agents including hydrochlorothiazide, valsartan and amlodipine. At her most recent visit it was noted that she had consistent hypokalemia across multiple laboratory evaluations. Given that she was difficult to manage on 3 antihypertensive medications and had hypokalemia laboratory evaluation was done to look for secondary cause: Specifically hyperaldosteronism. Laboratory evaluation came back positive for hyperaldosteronism with a plasma aldosterone level greater than 15 at 15.3 and a decreased renin level at 0.242. Plasma aldosterone renin ratio of 63.2. Although her aldosterone level was only slightly elevated above 15 given the total sum of evidence including her renin level, aldosterone renin ratio, difficult to manage hypertension and chronic hypokalemia the patient fits criteria for hypertension secondary to hyperaldosteronism. Today, I will discontinue her current antihypertensive medications and start spironolactone. Also, given that hyperaldosteronism can be secondary to tumor we will proceed with bilateral imaging of the adrenals. -- Start spironolactone 25 mg daily -- Discontinue current antihypertensive medications except amlodipine -- Return to clinic in 2 weeks for basic metabolic panel and blood pressure evaluation, please evaluate both her potassium level and her creatinine -- Bilateral adrenal CT to evaluate adrenals for potential tumor  Currently, at today's visit she remains hypertensive as documented below: Vitals:   01/17/17 1058  BP: (!) 181/94  Pulse: 90  Temp: 98.6 F (37 C)   I look forward to seeing her repeat blood pressure when she returns to clinic in 2 weeks for basic metabolic panel and hypertension evaluatio after we have made the above adjustments to her blood pressure regimen.

## 2017-01-17 NOTE — Progress Notes (Signed)
   CC: HTN follow-up HPI: [name] is a 56 y.o. female with a past medical history as listed below who presents for hypertension follow-up.  Past Medical History:  Diagnosis Date  . Anemia   . CHF (congestive heart failure) ([place])   . Chronic bronchitis ([place])    "probably once/yr" (05/12/2013)  . Diastolic heart failure   . Dyslipidemia    pt denies this hx on 05/12/2013  . Exertional shortness of breath   . GERD (gastroesophageal reflux disease)   . Hypertension   . Hypokalemia   . Venous stasis ulcers ([facility])      Review of Systems: Denies SOB and chest pain. Endorses polyuria.   Physical Exam: Vitals:   01/17/17 1058  BP: (!) 181/94  Pulse: 90  Temp: 98.6 F (37 C)  TempSrc: Oral  SpO2: 100%  Weight: 243 lb (110.2 kg)  Height: 5\' 8"  (1.727 m)   BP (!) 181/94 (BP Location: Left Arm, Patient Position: Sitting, Cuff Size: Normal)   Pulse 90   Temp 98.6 F (37 C) (Oral)   Ht 5\' 8"  (1.727 m)   Wt 243 lb (110.2 kg)   SpO2 100%   BMI 36.95 kg/m  General appearance: alert and cooperative Head: Normocephalic, without obvious abnormality, atraumatic Lungs: clear to auscultation bilaterally Heart: holosystolic murmur  Abdomen: soft, non-tender; bowel sounds normal; no masses,  no organomegaly Extremities: minimal trace edema  Assessment & Plan:  See encounters tab for problem based medical decision making. Patient discussed with Dr. Angelia Mould  Signed: Ophelia Shoulder, MD 01/17/2017, 11:01 AM  Pager: [phone]

## 2017-01-29 ENCOUNTER — Encounter: Payer: Self-pay | Admitting: *Deleted

## 2017-01-31 ENCOUNTER — Ambulatory Visit: Payer: Medicaid Other

## 2017-01-31 ENCOUNTER — Encounter: Payer: Self-pay | Admitting: Internal Medicine

## 2017-02-05 ENCOUNTER — Ambulatory Visit (HOSPITAL_COMMUNITY)
Admission: RE | Admit: 2017-02-05 | Discharge: 2017-02-05 | Disposition: A | Discharge status: Home / Self Care | Payer: Medicaid Other | Source: Ambulatory Visit | Attending: Internal Medicine | Admitting: Internal Medicine

## 2017-02-05 ENCOUNTER — Encounter (HOSPITAL_COMMUNITY): Payer: Self-pay

## 2017-02-05 ENCOUNTER — Telehealth: Payer: Self-pay | Admitting: Internal Medicine

## 2017-02-05 DIAGNOSIS — E279 Disorder of adrenal gland, unspecified: Secondary | ICD-10-CM | POA: Insufficient documentation

## 2017-02-05 DIAGNOSIS — E269 Hyperaldosteronism, unspecified: Secondary | ICD-10-CM | POA: Insufficient documentation

## 2017-02-05 DIAGNOSIS — I700 Atherosclerosis of aorta: Secondary | ICD-10-CM | POA: Diagnosis not present

## 2017-02-05 DIAGNOSIS — I152 Hypertension secondary to endocrine disorders: Secondary | ICD-10-CM | POA: Diagnosis not present

## 2017-02-05 MED ORDER — IOPAMIDOL (ISOVUE-300) INJECTION 61%
INTRAVENOUS | Status: AC
  Filled 2017-02-05: qty 100

## 2017-02-05 NOTE — Telephone Encounter (Signed)
Patient's adrenal CT today demonstrated an enlarging low-density right adrenal nodule suspicious for a functional adrenal adenoma. I called the patient and told her this information. She missed her follow-up appointment after we started spironolactone for the treatment of her blood pressure in the setting of hyperaldosteronism. I talked with her on the phone today and she will contact the clinic to schedule an appointment to be seen within the next week. At that visit please repeat a basic metabolic panel and ensure her potassium is within normal limits. Please check her blood pressure. Also, please make the appropriate next step in management given this finding on CT scan.

## 2017-02-12 ENCOUNTER — Ambulatory Visit (INDEPENDENT_AMBULATORY_CARE_PROVIDER_SITE_OTHER): Payer: Medicaid Other | Admitting: Internal Medicine

## 2017-02-12 VITALS — BP 156/83 | HR 72 | Temp 98.3°F | Wt 241.3 lb

## 2017-02-12 DIAGNOSIS — E269 Hyperaldosteronism, unspecified: Secondary | ICD-10-CM | POA: Diagnosis not present

## 2017-02-12 DIAGNOSIS — F17210 Nicotine dependence, cigarettes, uncomplicated: Secondary | ICD-10-CM | POA: Diagnosis not present

## 2017-02-12 DIAGNOSIS — E2609 Other primary hyperaldosteronism: Secondary | ICD-10-CM | POA: Diagnosis present

## 2017-02-12 DIAGNOSIS — I152 Hypertension secondary to endocrine disorders: Secondary | ICD-10-CM | POA: Diagnosis not present

## 2017-02-12 DIAGNOSIS — Z79899 Other long term (current) drug therapy: Secondary | ICD-10-CM

## 2017-02-12 MED ORDER — SPIRONOLACTONE 50 MG PO TABS: 50.0000 mg | ORAL_TABLET | Freq: Every day | ORAL | 1 refills | Status: DC

## 2017-02-12 NOTE — Assessment & Plan Note (Deleted)
Assessment Patient has uncontrolled hypertension secondary to primary hyperaldosteronism. Recent labs showing PAC greater than 15 and PRA less than 1. PAC:PRA ratio elevated at 63. Initial blood pressure 156/83 and repeat 151/82 at this visit. Patient denies having any symptoms such as headaches, chest pain, shortness of breath, or abdominal pain since her previous visit. Current medication regimen includes amlodipine 10 mg daily and spironolactone 25 mg daily.  Plan -Increase dose of spironolactone to 50 mg daily -Continue amlodipine as above -BMP to check renal function and potassium level -Further workup of primary hyper aldosteronism, see separate note

## 2017-02-12 NOTE — Progress Notes (Signed)
   CC: Patient is here for a follow-up of uncontrolled hypertension secondary to primary hyperaldosteronism.  HPI:  [name] is a 56 y.o. female with a past medical history of conditions listed below presenting to the clinic for a follow-up of uncontrolled hypertension secondary to primary hyperaldosteronism. Please see problem based charting for the status of the patient's current and chronic medical conditions.   Past Medical History:  Diagnosis Date  . Anemia   . CHF (congestive heart failure) ([place])   . Chronic bronchitis ([place])    "probably once/yr" (05/12/2013)  . Diastolic heart failure   . Dyslipidemia    pt denies this hx on 05/12/2013  . Exertional shortness of breath   . GERD (gastroesophageal reflux disease)   . Hypertension   . Hypokalemia   . Venous stasis ulcers ([facility])     Review of Systems: Pertinent positives mentioned in HPI. Remainder of all ROS negative.   Physical Exam:  Vitals:   02/12/17 0918  BP: (!) 156/83  Pulse: 72  Temp: 98.3 F (36.8 C)  TempSrc: Oral  SpO2: 100%  Weight: 241 lb 4.8 oz (109.5 kg)   Physical Exam  Constitutional: She is oriented to person, place, and time. She appears well-developed and well-nourished. No distress.  HENT:  Head: Normocephalic and atraumatic.  Eyes: Right eye exhibits no discharge. Left eye exhibits no discharge.  Cardiovascular: Normal rate, regular rhythm and intact distal pulses.   Murmur heard. Grade 3/6 systolic murmur best appreciated at the right upper sternal border  Pulmonary/Chest: Effort normal and breath sounds normal. No respiratory distress. She has no wheezes. She has no rales.  Abdominal: Soft. Bowel sounds are normal. She exhibits no distension. There is no tenderness.  Musculoskeletal: She exhibits no edema.  Neurological: She is alert and oriented to person, place, and time.  Skin: Skin is warm and dry.    Assessment & Plan:   See Encounters Tab for problem based  charting.  Patient discussed with Dr. Daryll Drown

## 2017-02-12 NOTE — Patient Instructions (Signed)
Ms. Boulay it was nice meeting you today.  -Dose of spironolactone has been increased: Take 50 mg by mouth once daily  -Continue taking amlodipine as before  -You have been referred to interventional radiology for adrenal vein sampling  -Return to the clinic in 4 weeks for blood pressure recheck

## 2017-02-12 NOTE — Assessment & Plan Note (Addendum)
Assessment Patient has a history of uncontrolled hypertension. Workup for primary hyperaldosteronism was positive during prior visit. PAC greater than 15 and PRA less than 1. PAC:PRA ratio elevated at 63. Adrenal CT showing an enlarging low-density right adrenal nodule (3.73.1 cm), enlarged since previous CT in 2009 (was 2.2 x 1.4 cm at that time). Findings suspicious for functional adrenal adenoma.  Plan -Referral to interventional radiology for adrenal vein sampling

## 2017-02-12 NOTE — Assessment & Plan Note (Addendum)
Assessment Patient has uncontrolled hypertension secondary to primary hyperaldosteronism. Recent labs showing PAC greater than 15 and PRA less than 1. PAC:PRA ratio elevated at 63. Initial blood pressure 156/83 and repeat 151/82 at this visit. Patient denies having any symptoms such as headaches, chest pain, shortness of breath, or abdominal pain since her previous visit. Current medication regimen includes amlodipine 10 mg daily and spironolactone 25 mg daily.  Plan -Increase dose of spironolactone to 50 mg daily -Continue amlodipine as above -BMP to check renal function and potassium level -Further workup of primary hyper aldosteronism, see separate note -Return to clinic in 4 weeks  Addendum 02/13/2017: Cr 1.2, improved since prior labs on 01/03/2017. Potassium normal. Tried calling the patient but could not reach her over the phone.

## 2017-02-13 LAB — BMP8+ANION GAP
ANION GAP: 15 mmol/L (ref 10.0–18.0)
BUN / CREAT RATIO: 17 (ref 9–23)
BUN: 21 mg/dL (ref 6–24)
CALCIUM: 9.3 mg/dL (ref 8.7–10.2)
CHLORIDE: 102 mmol/L (ref 96–106)
CO2: 26 mmol/L (ref 20–29)
CREATININE: 1.21 mg/dL — AB (ref 0.57–1.00)
GFR calc Af Amer: 58 mL/min/{1.73_m2} — ABNORMAL LOW (ref >59)
GFR calc non Af Amer: 50 mL/min/{1.73_m2} — ABNORMAL LOW (ref >59)
GLUCOSE: 84 mg/dL (ref 65–99)
Potassium: 4.2 mmol/L (ref 3.5–5.2)
Sodium: 143 mmol/L (ref 134–144)

## 2017-02-13 NOTE — Progress Notes (Signed)
Internal Medicine Clinic Attending  Case discussed with Dr. Rathoreat the time of the visit. We reviewed the resident's history and exam and pertinent patient test results. I agree with the assessment, diagnosis, and plan of care documented in the resident's note.  

## 2017-02-14 ENCOUNTER — Encounter: Payer: Self-pay | Admitting: Internal Medicine

## 2017-02-15 NOTE — Addendum Note (Signed)
Addended by: Shela Leff on: 02/15/2017 05:22 PM   Modules accepted: Orders

## 2017-02-26 ENCOUNTER — Other Ambulatory Visit: Payer: Self-pay | Admitting: Student

## 2017-02-26 ENCOUNTER — Other Ambulatory Visit: Payer: Self-pay | Admitting: Radiology

## 2017-02-26 ENCOUNTER — Other Ambulatory Visit (HOSPITAL_COMMUNITY): Payer: Self-pay | Admitting: Interventional Radiology

## 2017-02-27 ENCOUNTER — Ambulatory Visit (HOSPITAL_COMMUNITY)
Admission: RE | Admit: 2017-02-27 | Discharge: 2017-02-27 | Disposition: A | Discharge status: Home / Self Care | Payer: Medicaid Other | Source: Ambulatory Visit | Attending: Internal Medicine | Admitting: Internal Medicine

## 2017-02-27 ENCOUNTER — Encounter (HOSPITAL_COMMUNITY): Payer: Self-pay

## 2017-02-27 ENCOUNTER — Other Ambulatory Visit: Payer: Self-pay | Admitting: Internal Medicine

## 2017-02-27 DIAGNOSIS — F17210 Nicotine dependence, cigarettes, uncomplicated: Secondary | ICD-10-CM | POA: Insufficient documentation

## 2017-02-27 DIAGNOSIS — I110 Hypertensive heart disease with heart failure: Secondary | ICD-10-CM | POA: Insufficient documentation

## 2017-02-27 DIAGNOSIS — I5032 Chronic diastolic (congestive) heart failure: Secondary | ICD-10-CM | POA: Insufficient documentation

## 2017-02-27 DIAGNOSIS — I700 Atherosclerosis of aorta: Secondary | ICD-10-CM | POA: Diagnosis not present

## 2017-02-27 DIAGNOSIS — E785 Hyperlipidemia, unspecified: Secondary | ICD-10-CM | POA: Insufficient documentation

## 2017-02-27 DIAGNOSIS — E269 Hyperaldosteronism, unspecified: Secondary | ICD-10-CM | POA: Insufficient documentation

## 2017-02-27 DIAGNOSIS — Z7982 Long term (current) use of aspirin: Secondary | ICD-10-CM | POA: Insufficient documentation

## 2017-02-27 DIAGNOSIS — K219 Gastro-esophageal reflux disease without esophagitis: Secondary | ICD-10-CM | POA: Insufficient documentation

## 2017-02-27 HISTORY — PX: IR VENOUS SAMPLING: IMG694

## 2017-02-27 HISTORY — PX: IR VENOGRAM RENAL BI: IMG682

## 2017-02-27 HISTORY — PX: IR VENOGRAM ADRENAL BI: IMG685

## 2017-02-27 HISTORY — PX: IR VENOGRAM HEPATIC WO HEMODYNAMIC EVALUATION: IMG693

## 2017-02-27 HISTORY — PX: IR US GUIDE VASC ACCESS RIGHT: IMG2390

## 2017-02-27 LAB — COMPREHENSIVE METABOLIC PANEL
ALBUMIN: 3.9 g/dL (ref 3.5–5.0)
ALK PHOS: 68 U/L (ref 38–126)
ALT: 12 U/L — AB (ref 14–54)
AST: 17 U/L (ref 15–41)
Anion gap: 10 (ref 5–15)
BILIRUBIN TOTAL: 0.9 mg/dL (ref 0.3–1.2)
BUN: 21 mg/dL — ABNORMAL HIGH (ref 6–20)
CALCIUM: 9.2 mg/dL (ref 8.9–10.3)
CO2: 27 mmol/L (ref 22–32)
CREATININE: 1.26 mg/dL — AB (ref 0.44–1.00)
Chloride: 102 mmol/L (ref 101–111)
GFR calc non Af Amer: 47 mL/min — ABNORMAL LOW (ref >60)
GFR, EST AFRICAN AMERICAN: 54 mL/min — AB (ref >60)
GLUCOSE: 108 mg/dL — AB (ref 65–99)
Potassium: 3.5 mmol/L (ref 3.5–5.1)
SODIUM: 139 mmol/L (ref 135–145)
TOTAL PROTEIN: 7.8 g/dL (ref 6.5–8.1)

## 2017-02-27 LAB — CBC
HEMATOCRIT: 39.6 % (ref 36.0–46.0)
HEMOGLOBIN: 11.9 g/dL — AB (ref 12.0–15.0)
MCH: 27.1 pg (ref 26.0–34.0)
MCHC: 30.1 g/dL (ref 30.0–36.0)
MCV: 90.2 fL (ref 78.0–100.0)
Platelets: 291 10*3/uL (ref 150–400)
RBC: 4.39 MIL/uL (ref 3.87–5.11)
RDW: 13.2 % (ref 11.5–15.5)
WBC: 6.6 10*3/uL (ref 4.0–10.5)

## 2017-02-27 LAB — CORTISOL
CORTISOL PLASMA: 14 ug/dL
CORTISOL PLASMA: 6.6 ug/dL
CORTISOL PLASMA: 7 ug/dL
CORTISOL PLASMA: 14.6 ug/dL
CORTISOL PLASMA: 23.6 ug/dL
Cortisol, Plasma: 6.7 ug/dL
Cortisol, Plasma: 16.9 ug/dL
Cortisol, Plasma: 19.5 ug/dL
Cortisol, Plasma: 19.9 ug/dL
Cortisol, Plasma: 5 ug/dL
Cortisol, Plasma: >100 ug/dL

## 2017-02-27 LAB — PROTIME-INR
INR: 0.99
Prothrombin Time: 13.1 seconds (ref 11.4–15.2)

## 2017-02-27 LAB — APTT: aPTT: 24 seconds (ref 24–36)

## 2017-02-27 MED ORDER — LIDOCAINE HCL (PF) 1 % IJ SOLN
INTRAMUSCULAR | Status: AC
  Filled 2017-02-27: qty 30

## 2017-02-27 MED ORDER — SODIUM CHLORIDE 0.9 % IV SOLN
150.0000 ug | INTRAVENOUS | Status: DC
  Administered 2017-02-27: 0.05 mg via INTRAVENOUS
  Administered 2017-02-27: 0.15 mg via INTRAVENOUS
  Filled 2017-02-27 (×7): qty 0.15

## 2017-02-27 MED ORDER — MIDAZOLAM HCL 2 MG/2ML IJ SOLN
INTRAMUSCULAR | Status: AC | PRN
  Administered 2017-02-27 (×2): 1 mg via INTRAVENOUS
  Administered 2017-02-27 (×2): 0.5 mg via INTRAVENOUS
  Administered 2017-02-27: 1 mg via INTRAVENOUS

## 2017-02-27 MED ORDER — SODIUM CHLORIDE 0.9 % IV SOLN
INTRAVENOUS | Status: DC
  Administered 2017-02-27: 09:00:00 via INTRAVENOUS

## 2017-02-27 MED ORDER — FENTANYL CITRATE (PF) 100 MCG/2ML IJ SOLN
INTRAMUSCULAR | Status: AC | PRN
  Administered 2017-02-27 (×2): 50 ug via INTRAVENOUS
  Administered 2017-02-27 (×3): 25 ug via INTRAVENOUS

## 2017-02-27 MED ORDER — SODIUM CHLORIDE 0.9 % IV SOLN
INTRAVENOUS | Status: DC
  Administered 2017-02-27: 13:00:00 via INTRAVENOUS

## 2017-02-27 MED ORDER — LIDOCAINE HCL (PF) 1 % IJ SOLN
INTRAMUSCULAR | Status: AC | PRN
  Administered 2017-02-27: 10 mL

## 2017-02-27 MED ORDER — FENTANYL CITRATE (PF) 100 MCG/2ML IJ SOLN
INTRAMUSCULAR | Status: AC
  Filled 2017-02-27: qty 6

## 2017-02-27 MED ORDER — MIDAZOLAM HCL 2 MG/2ML IJ SOLN
INTRAMUSCULAR | Status: AC
  Filled 2017-02-27: qty 6

## 2017-02-27 MED ORDER — HYDROCODONE-ACETAMINOPHEN 5-325 MG PO TABS: 1.0000 | ORAL_TABLET | ORAL | Status: DC | PRN

## 2017-02-27 MED ORDER — IOPAMIDOL (ISOVUE-300) INJECTION 61%
INTRAVENOUS | Status: AC
  Administered 2017-02-27: 85 mL
  Filled 2017-02-27: qty 150

## 2017-02-27 NOTE — Discharge Instructions (Signed)

## 2017-02-27 NOTE — Sedation Documentation (Signed)
Patient is resting comfortably. 

## 2017-02-27 NOTE — Procedures (Signed)
Interventional Radiology Procedure Note  Procedure:  Bilateral renal, bilateral adrenal and hepatic venography.  Bilateral adrenal vein sampling.  Complications: None  Estimated Blood Loss: < 10 mL  Venography and multiple venous sampling obtained prior to/ and following cosyntropin stimulation.  Samples sent for aldosterone and cortisol levels. Access:  Right common femoral vein; 5 Fr sheaths x 2  Ekin Pilar T. Kathlene Cote, M.D Pager:  [phone]

## 2017-02-27 NOTE — Sedation Documentation (Signed)
Dr. Kathlene Cote made aware of pt's increasing BP.

## 2017-02-27 NOTE — H&P (Signed)
Chief Complaint: Patient was seen in consultation today for adrenal venous sampling  at the request of Mullen,Emily B  Referring Physician(s): Mullen,Emily B Dr [name]  Supervising Physician: Aletta Edouard  Patient Status: [facility] - Out-pt  History of Present Illness: [name] is a 56 y.o. female   Hx refractory HTN Secondary primary hyperaldosteronism Has been followed and treated for same  Scheduled now for adrenal venous sampling  CT 02/05/17 IMPRESSION: 1. Enlarging low-density right adrenal nodule from 2009, suspicious for a functional adrenal adenoma given the patient's hyperaldosteronism. Consider adrenal vein sampling. No aggressive characteristics identified. 2. No acute findings. 3.  Aortic Atherosclerosis (ICD10-I70.0).   Past Medical History:  Diagnosis Date  . Anemia   . CHF (congestive heart failure) ([place])   . Chronic bronchitis ([place])    "probably once/yr" (05/12/2013)  . Diastolic heart failure   . Dyslipidemia    pt denies this hx on 05/12/2013  . Exertional shortness of breath   . GERD (gastroesophageal reflux disease)   . Hypertension   . Hypokalemia   . Venous stasis ulcers (HCC)     Past Surgical History:  Procedure Laterality Date  . VAGINAL HYSTERECTOMY  1990's    Allergies: Lisinopril  Medications: Prior to Admission medications   Medication Sig Start Date End Date Taking? Authorizing Provider  amLODipine (NORVASC) 10 MG tablet Take 1 tablet (10 mg total) by mouth daily. 12/22/16 12/22/17 Yes Sid Falcon, MD  aspirin EC 81 MG EC tablet Take 1 tablet (81 mg total) by mouth daily. 06/13/13  Yes Dorian Heckle, MD  spironolactone (ALDACTONE) 50 MG tablet Take 1 tablet (50 mg total) by mouth daily. 02/12/17  Yes Shela Leff, MD  atorvastatin (LIPITOR) 40 MG tablet Take 1 tablet (40 mg total) by mouth daily. Patient not taking: Reported on 02/22/2017 12/15/15   Jones Bales, MD     Family History  Problem Relation  Age of Onset  . Hypertension Mother   . Hypertension Sister   . Diabetes Sister   . Hypertension Brother   . Diabetes Sister     Social History   Social History  . Marital status: Single    Spouse name: N/A  . Number of children: N/A  . Years of education: N/A   Social History Main Topics  . Smoking status: Current Every Day Smoker    Packs/day: 0.40    Years: 36.00    Types: Cigarettes  . Smokeless tobacco: Never Used     Comment: trying to cut back.  . Alcohol use 4.2 oz/week    7 Cans of beer per week     Comment: Beer once a week.  . Drug use: No     Comment: 05/12/2013 "tried it a few days ago; didn't like it"  . Sexual activity: Not Asked   Other Topics Concern  . None   Social History Narrative  . None    Review of Systems: A 12 point ROS discussed and pertinent positives are indicated in the HPI above.  All other systems are negative.  Review of Systems  Constitutional: Positive for fatigue. Negative for activity change, appetite change and fever.  Respiratory: Positive for shortness of breath. Negative for cough.   Cardiovascular: Negative for chest pain.  Gastrointestinal: Negative for abdominal pain.  Neurological: Positive for weakness.  Psychiatric/Behavioral: Negative for behavioral problems and confusion.    Vital Signs: BP (!) 182/96   Pulse 68   Temp 98.1 F (36.7 C) (  Oral)   Resp 18   Ht 5\' 8"  (1.727 m)   Wt 241 lb (109.3 kg)   SpO2 100%   BMI 36.64 kg/m   Physical Exam  Constitutional: She is oriented to person, place, and time. She appears well-nourished.  Cardiovascular: Normal rate, regular rhythm and normal heart sounds.   Pulmonary/Chest: Effort normal and breath sounds normal.  Abdominal: Soft. Bowel sounds are normal.  Musculoskeletal: Normal range of motion.  Neurological: She is alert and oriented to person, place, and time.  Skin: Skin is warm and dry.  Psychiatric: She has a normal mood and affect. Her behavior is  normal. Judgment and thought content normal.  Nursing note and vitals reviewed.   Mallampati Score:  MD Evaluation Airway: WNL Heart: WNL Abdomen: WNL Chest/ Lungs: WNL ASA  Classification: 2 Mallampati/Airway Score: Two  Imaging: Ct Adrenal Abd Wo  Result Date: 02/05/2017 CLINICAL DATA:  Uncontrollable blood pressure do to endocrine disorder, hyperaldosteronism. EXAM: CT ABDOMEN WITHOUT CONTRAST TECHNIQUE: Multidetector CT imaging of the abdomen was performed following the standard protocol without IV contrast. COMPARISON:  Ultrasound 01/04/2013.  CT 08/06/2008. FINDINGS: Lower chest: Clear lung bases. No significant pleural or pericardial effusion. Hepatobiliary: The hepatic density is diffusely decreased, consistent with steatosis. No focal lesions or abnormal enhancement seen following contrast. No evidence of gallstones, gallbladder wall thickening or biliary dilatation. Pancreas: Unremarkable. No pancreatic ductal dilatation or surrounding inflammatory changes. Spleen: Normal in size without focal abnormality. Adrenals/Urinary Tract: Low-density right adrenal nodule has enlarged compared with previous CT. This now measures 3.7 x 3.1 cm (previously 2.2 x 1.4 cm). This nodule demonstrates enhancement following contrast, measuring 0 HU prior to contrast, 53 HU on portal phase imaging and 26 HU on delayed phase imaging. There is mild fullness of the left adrenal gland without focal nodularity. There is minimal cyst formation in the upper pole the right kidney. No evidence of enhancing renal mass, urinary tract calculus or hydronephrosis. Stomach/Bowel: No evidence of bowel wall thickening, distention or surrounding inflammatory change. Vascular/Lymphatic: There are no enlarged abdominal lymph nodes. Aortic and branch vessel atherosclerosis. No evidence of large vessel occlusion or aneurysm. Other: Small umbilical hernia containing only fat. No evidence of ascites. Musculoskeletal: No acute or  significant osseous findings. IMPRESSION: 1. Enlarging low-density right adrenal nodule from 2009, suspicious for a functional adrenal adenoma given the patient's hyperaldosteronism. Consider adrenal vein sampling. No aggressive characteristics identified. 2. No acute findings. 3.  Aortic Atherosclerosis (ICD10-I70.0). Electronically Signed   By: Richardean Sale M.D.   On: 02/05/2017 13:58    Labs:  CBC:  Recent Labs  02/27/17 0732  WBC 6.6  HGB 11.9*  HCT 39.6  PLT 291    COAGS:  Recent Labs  02/27/17 0732  INR 0.99  APTT 24    BMP:  Recent Labs  12/20/16 1045 01/03/17 1124 02/12/17 1025 02/27/17 0732  NA 142 140 143 139  K 3.1* 3.2* 4.2 3.5  CL 96 93* 102 102  CO2 28 31* 26 27  GLUCOSE 74 93 84 108*  BUN 15 20 21  21*  CALCIUM 8.9 9.1 9.3 9.2  CREATININE 1.20* 1.47* 1.21* 1.26*  GFRNONAA 51* 40* 50* 47*  GFRAA 58* 46* 58* 54*    LIVER FUNCTION TESTS:  Recent Labs  02/27/17 0732  BILITOT 0.9  AST 17  ALT 12*  ALKPHOS 68  PROT 7.8  ALBUMIN 3.9    TUMOR MARKERS: No results for input(s): AFPTM, CEA, CA199, CHROMGRNA in the last 8760  hours.  Assessment and Plan:  Uncontrolled HTN; Hyperaldosteronism Scheduled for adrenal venous sampling Risks and Benefits discussed with the patient including, but not limited to bleeding, infection, vascular injury or contrast induced renal failure. All of the patient's questions were answered, patient is agreeable to proceed. Consent signed and in chart.  Thank you for this interesting consult.  I greatly enjoyed meeting [facility] and look forward to participating in their care.  A copy of this report was sent to the requesting provider on this date.  Electronically Signed: Lavonia Drafts, PA-C 02/27/2017, 8:53 AM   I spent a total of  30 Minutes   in face to face in clinical consultation, greater than 50% of which was counseling/coordinating care for adrenal venous sampling

## 2017-02-27 NOTE — Sedation Documentation (Signed)
Pt states her back hurts.

## 2017-03-01 LAB — ALDOSTERONE
ALDOSTERONE: 7.9 ng/dL (ref 0.0–30.0)
ALDOSTERONE: 23.7 ng/dL (ref 0.0–30.0)
ALDOSTERONE: 52.8 ng/dL — AB (ref 0.0–30.0)
Aldosterone: 58.3 ng/dL — ABNORMAL HIGH (ref 0.0–30.0)
Aldosterone: 1.4 ng/dL (ref 0.0–30.0)

## 2017-03-02 LAB — ALDOSTERONE
ALDOSTERONE: 19.8 ng/dL (ref 0.0–30.0)
ALDOSTERONE: 2.9 ng/dL (ref 0.0–30.0)
ALDOSTERONE: 15.9 ng/dL (ref 0.0–30.0)

## 2017-03-06 ENCOUNTER — Other Ambulatory Visit: Payer: Self-pay | Admitting: Internal Medicine

## 2017-03-06 LAB — ALDOSTERONE: Aldosterone: 497.1 ng/dL — ABNORMAL HIGH (ref 0.0–30.0)

## 2017-03-11 LAB — ALDOSTERONE: Aldosterone: 5161.8 ng/dL — ABNORMAL HIGH (ref 0.0–30.0)

## 2017-03-12 ENCOUNTER — Other Ambulatory Visit: Payer: Self-pay | Admitting: Internal Medicine

## 2017-03-12 ENCOUNTER — Encounter: Payer: Self-pay | Admitting: Internal Medicine

## 2017-03-12 ENCOUNTER — Ambulatory Visit (INDEPENDENT_AMBULATORY_CARE_PROVIDER_SITE_OTHER): Payer: Medicaid Other | Admitting: Internal Medicine

## 2017-03-12 VITALS — BP 197/94 | HR 67 | Temp 98.1°F | Wt 247.8 lb

## 2017-03-12 DIAGNOSIS — E269 Hyperaldosteronism, unspecified: Secondary | ICD-10-CM | POA: Diagnosis not present

## 2017-03-12 DIAGNOSIS — I10 Essential (primary) hypertension: Secondary | ICD-10-CM

## 2017-03-12 DIAGNOSIS — F17210 Nicotine dependence, cigarettes, uncomplicated: Secondary | ICD-10-CM

## 2017-03-12 DIAGNOSIS — I152 Hypertension secondary to endocrine disorders: Secondary | ICD-10-CM | POA: Diagnosis not present

## 2017-03-12 DIAGNOSIS — Z8249 Family history of ischemic heart disease and other diseases of the circulatory system: Secondary | ICD-10-CM | POA: Diagnosis not present

## 2017-03-12 MED ORDER — SPIRONOLACTONE 100 MG PO TABS: 100.0000 mg | ORAL_TABLET | Freq: Every day | ORAL | 1 refills | Status: DC

## 2017-03-12 NOTE — Progress Notes (Signed)
   CC: Hypertension follow up  HPI:  [name] is a 56 y.o. with a PMH of severely uncontrolled hypertension, HF with preserved ejection fraction (echo 2014), and GERD. The patient was previously seen on 02/12/2017 for HTN. At this visit the patient's spironolactone was increased to 50 mg daily and her amlodipine 10 mg was continued.  Since her last visit the patient reports that her headache frequency has decreased. She also endorses a decrease in the episodes of "seeing black spots". She says that she knows she has these symptoms when her BP is high and is feeling good that these episodes have decreased in frequency. She attributes this change in her symptoms to her new blood pressure medication, which she has been taking every day. She has, however, been unable to take her amlodipine 10 mg for the past 3 days as she ran out of pills over the weekend. She knows the importance of taking this medication everyday and picked up her new pills at the pharmacy right before today's clinic visit.  Past Medical History:  Diagnosis Date  . Anemia   . CHF (congestive heart failure) ([place])   . Chronic bronchitis ([place])    "probably once/yr" (05/12/2013)  . Diastolic heart failure   . Dyslipidemia    pt denies this hx on 05/12/2013  . Exertional shortness of breath   . GERD (gastroesophageal reflux disease)   . Hypertension   . Hypokalemia   . Venous stasis ulcers (HCC)    Review of Systems:   Patient endorses intermittent shortness of breath with exertion and laying flat Patient denies radiating chest pain, palpitations, focal weakness/numbness, difficulties with speech, lower extremity swelling, and changes in bowel/bladder habits.  Physical Exam:  Vitals:   03/12/17 1109  BP: (!) 197/94  Pulse: 67  Temp: 98.1 F (36.7 C)  TempSrc: Oral  SpO2: 100%  Weight: 247 lb 12.8 oz (112.4 kg)   Physical Exam  Constitutional: She appears well-developed and well-nourished. No distress.    Cardiovascular: Normal rate, regular rhythm, normal heart sounds and intact distal pulses.  Exam reveals no friction rub.   No murmur heard. Pulmonary/Chest: Effort normal and breath sounds normal. No respiratory distress. She has no wheezes.  Abdominal: Soft. There is no tenderness.  Musculoskeletal: She exhibits no edema (of bilateral lower extremities).  Skin: Skin is warm and dry. Capillary refill takes less than 2 seconds. No rash noted. No erythema.    Assessment & Plan:   See Encounters Tab for problem based charting.  Patient seen with Dr. Daryll Drown.

## 2017-03-12 NOTE — Assessment & Plan Note (Signed)
The patient's BP was elevated above goal of 140/90 in clinic today, however the patient had not taken her amlodipine for 3 days prior to today's visit. Nevertheless, the patient's hypertension was uncontrolled at her last visit on current regimen. Given this, the patient's spironolactone was increased from 50 mg to 100 mg daily. The patient was instructed to follow up in 2 weeks for a BP recheck. She was given return precautions and counseled to go to the ED if stoke symptoms, including difficulties with speech and focal weakness, occur.  Plan: -Continue amlodipine 10 mg daily -Increase spironolactone from 50 mg to 100 mg daily

## 2017-03-12 NOTE — Patient Instructions (Addendum)
Thank you for seeing [place] today!  Please continue to take amlodipine 10 mg daily.   You need to INCREASE your dose of spironolactone from 50 mg to 100 mg daily. You can START taking two 50 mg tablets daily. After you finish taking all of the spironolactone tablets you have, you can go to your pharmacy for a single 100 mg tablet. Once you get this new tablet, you will take one of these tablets per day.  Please return to clinic in 2 weeks for BP recheck or call if you have worsening headaches and changes in vision. If you develop all of the sudden weakness or tingling on one side of your body or any difficulties with speech or walking, please go to the emergency room.

## 2017-03-12 NOTE — Assessment & Plan Note (Signed)
The patient is in the middle of a workup for hyperaldosteronism and, per chart review, her notes regarding adrenal venous sampling with IR have not yet been complete after resulting of her stimulation tests. The IR clinic was contacted by nursing today to see if these results could be obtained. We will follow up with IR to see if these results can be obtained by the next visit, as the patient has not heard about these results and is worried about what the test showed.

## 2017-03-13 NOTE — Progress Notes (Signed)
Internal Medicine Clinic Attending  I saw and evaluated the patient.  I personally confirmed the key portions of the history and exam documented by Dr. Nedrud and I reviewed pertinent patient test results.  The assessment, diagnosis, and plan were formulated together and I agree with the documentation in the resident's note.  

## 2017-03-14 ENCOUNTER — Telehealth: Payer: Self-pay | Admitting: Internal Medicine

## 2017-03-14 ENCOUNTER — Encounter (HOSPITAL_COMMUNITY): Payer: Self-pay | Admitting: Interventional Radiology

## 2017-03-14 NOTE — Telephone Encounter (Signed)
Encounter opened in error

## 2017-05-05 IMAGING — CR DG CHEST 2V
2 series · 2 of 2 positions shown · non-contrast
Comparison: 12/09/2014 and 06/11/2013 radiographs.

CLINICAL DATA: Elevated blood pressure today with shortness of
breath for 3 weeks. History of hypertension, smoking and bronchitis.
Initial encounter.

EXAM:
CHEST  2 VIEW

[w chest pa]
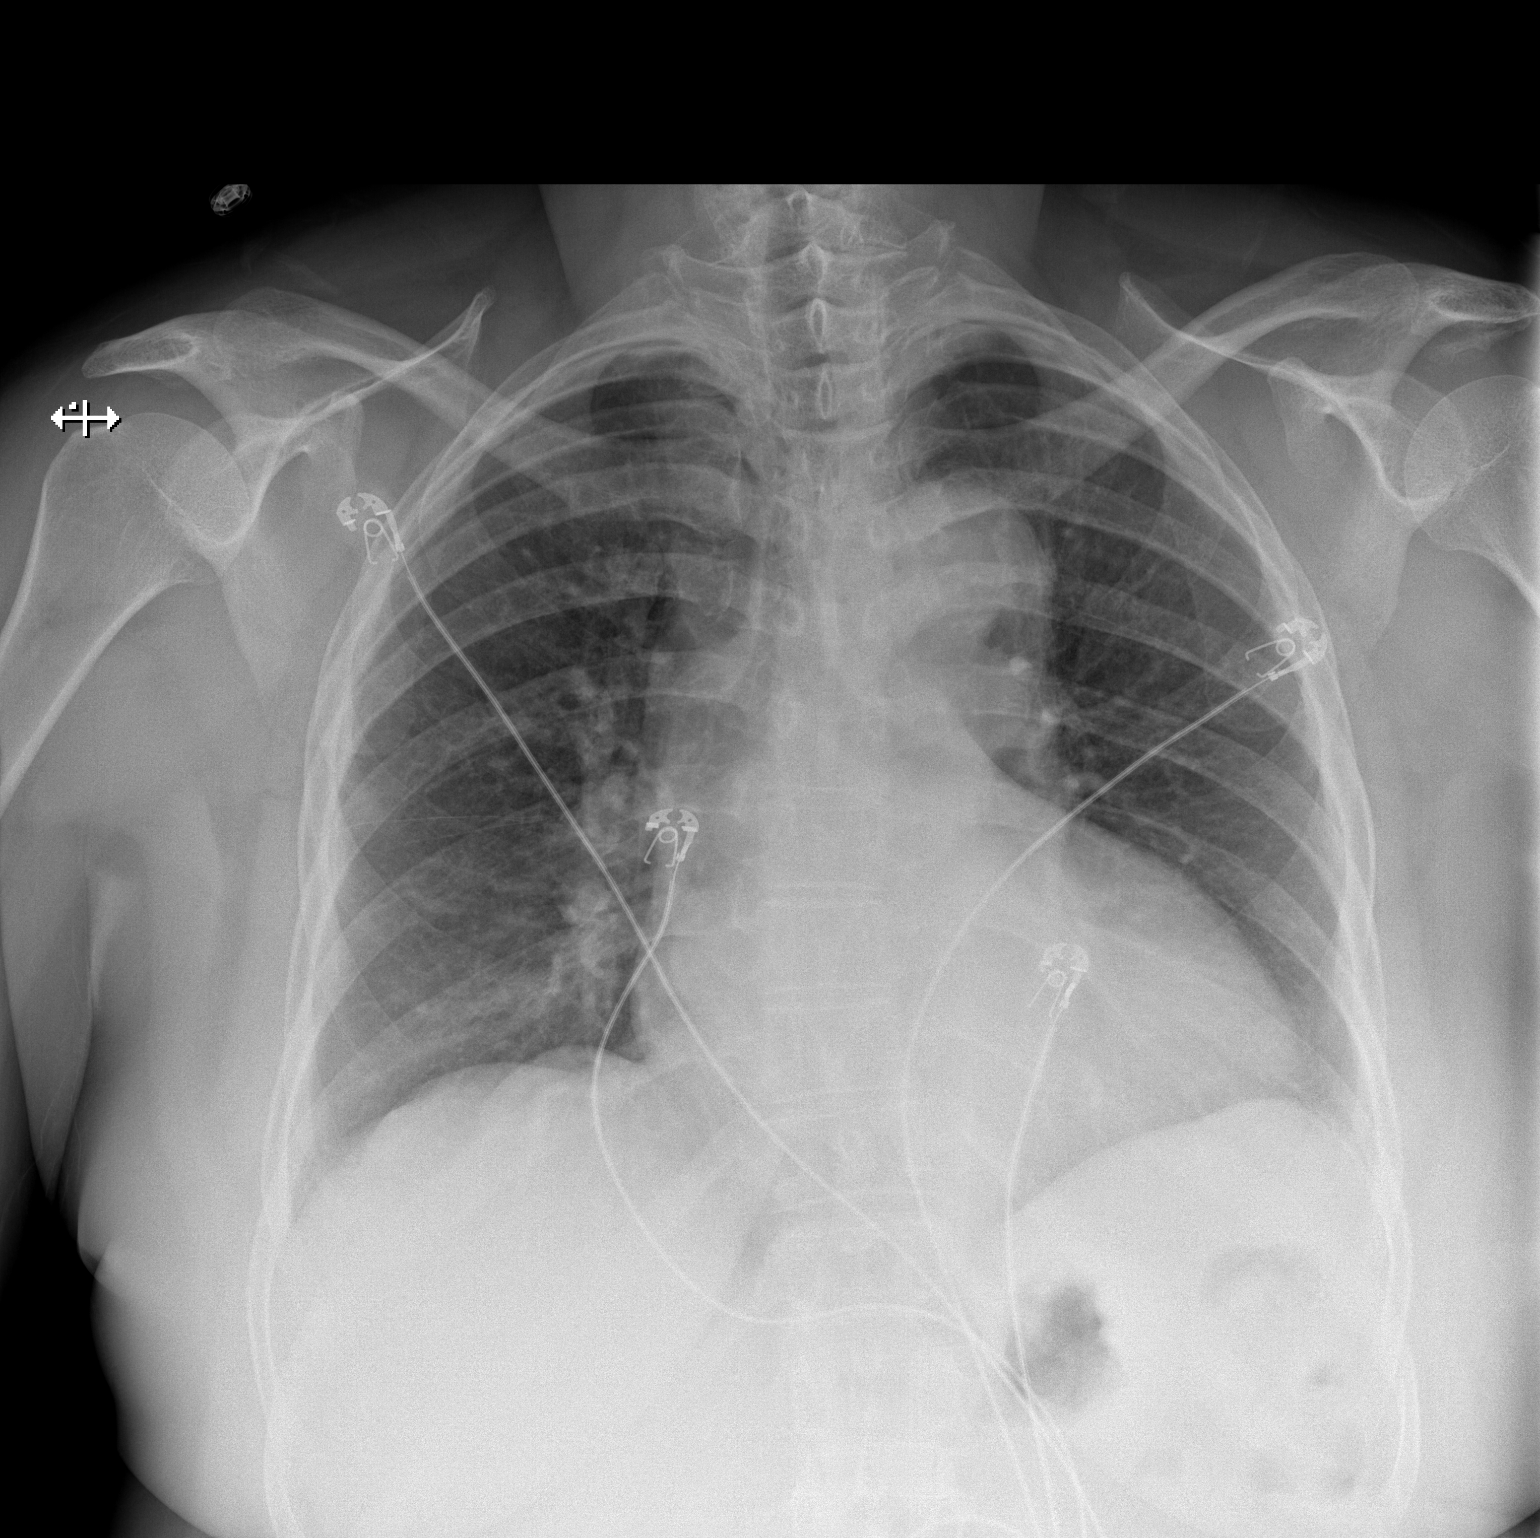

[w chest lat]
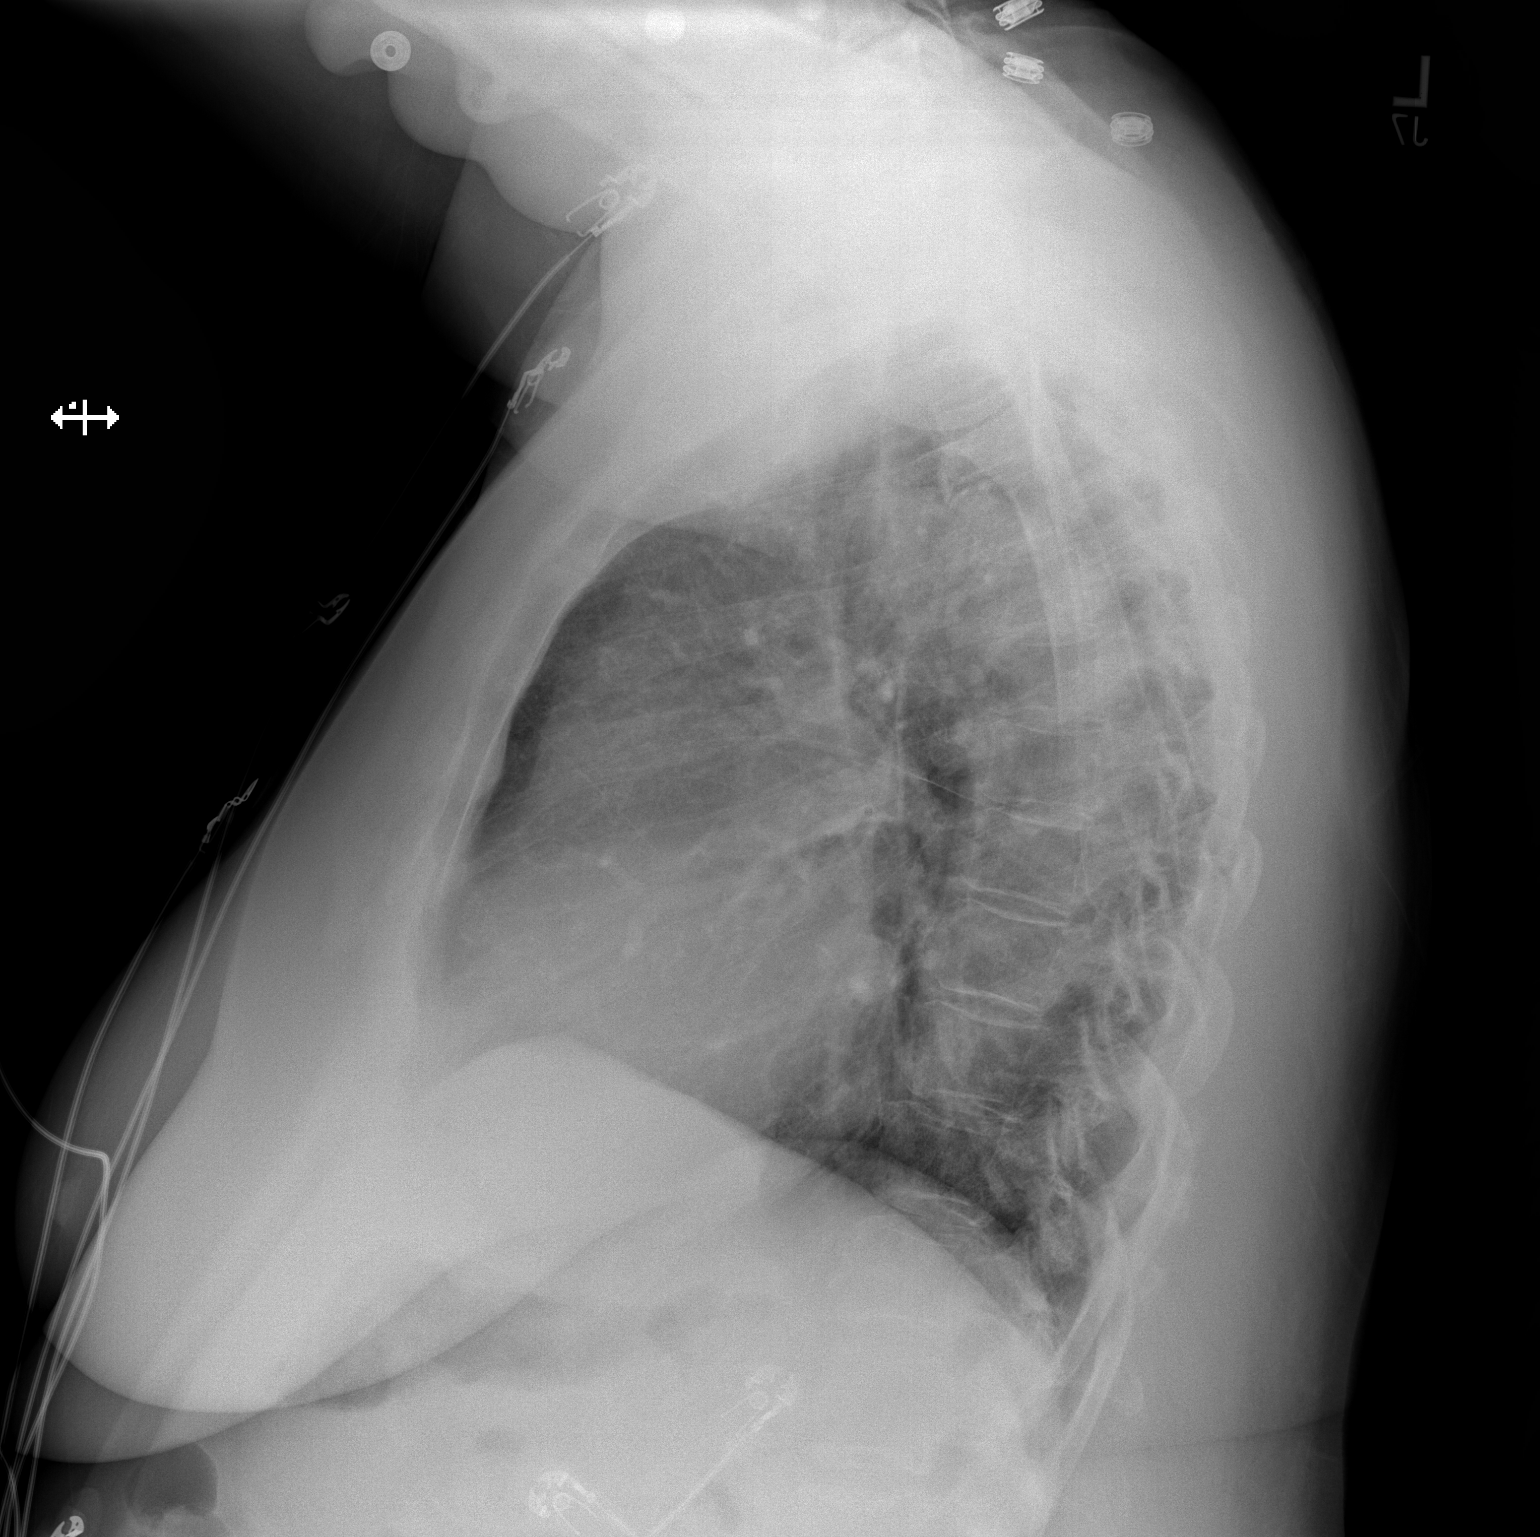

[2 of 2 positions shown; findings below may reference images not displayed]

FINDINGS: There is stable cardiomegaly and mild aortic atherosclerosis. The
lungs appear unchanged with chronic central airway thickening. There
is no edema or confluent airspace opacity. I believe the previously
questioned nodule on the left was due to an EKG snap. There is no
pleural effusion. The bones appear stable.
IMPRESSION: Stable chest with cardiomegaly and chronic interstitial prominence.
No acute findings.

## 2017-05-30 ENCOUNTER — Encounter: Payer: Self-pay | Admitting: Internal Medicine

## 2017-05-30 ENCOUNTER — Ambulatory Visit (INDEPENDENT_AMBULATORY_CARE_PROVIDER_SITE_OTHER): Payer: Medicaid Other | Admitting: Internal Medicine

## 2017-05-30 VITALS — BP 177/94 | HR 77 | Temp 98.6°F | Ht 68.0 in | Wt 247.6 lb

## 2017-05-30 DIAGNOSIS — M25431 Effusion, right wrist: Secondary | ICD-10-CM | POA: Diagnosis not present

## 2017-05-30 DIAGNOSIS — J069 Acute upper respiratory infection, unspecified: Secondary | ICD-10-CM | POA: Insufficient documentation

## 2017-05-30 DIAGNOSIS — J00 Acute nasopharyngitis [common cold]: Secondary | ICD-10-CM | POA: Diagnosis not present

## 2017-05-30 DIAGNOSIS — I152 Hypertension secondary to endocrine disorders: Secondary | ICD-10-CM | POA: Diagnosis not present

## 2017-05-30 MED ORDER — IBUPROFEN 200 MG PO TABS: 600.0000 mg | ORAL_TABLET | Freq: Three times a day (TID) | ORAL | 0 refills | Status: AC | PRN

## 2017-05-30 NOTE — Patient Instructions (Signed)
It was a pleasure to meet you today [name]. During your visit we spoke about your cold and about your right wrist pain.   Cold: your cold is likely due to a virus and you do not need antibiotics at this time. Please make sure to drink plenty of fluids and slowly progress your diet. Make sure to get plenty of rest.   Wrist swelling: The wrist pain is possibly due to pseudogout which occurs when there are crystal deposits in your joints.  -please take ibuprofen as prescribed and return to clinic if you continue to have pain.  -We can also drain out the fluid in your joint if you are comfortable with it.   Please let us know if you have any questions.   Best,  Lars Mage, MD Internal Medicine PGY1   Calcium Pyrophosphate Deposition Calcium pyrophosphate deposition (CPPD), which is also called pseudogout, is a type of arthritis that causes pain, swelling, and inflammation in a joint. The joint pain can be severe and may last for days. If it is not treated, the pain may last much longer. Attacks of CPPD may come and go. This condition usually affects one joint at a time. The joints that are affected most commonly are the knees, but this condition can also affect the wrists, elbows, shoulders, or ankles. CPPD is similar to gout. Both conditions result from the buildup of crystals in the joint. However, CPPD is caused by a type of crystal that is different than the crystals that cause gout. What are the causes? This condition is caused by the buildup of calcium pyrophosphate dihydrate crystals in the joint. The reason why this buildup occurs is not known. The condition may be passed down from parent to child (hereditary). What increases the risk? This condition is more likely to develop in people who:  Are over 107 years old.  Have a family history of the condition.  Have had joint replacement surgery.  Have had a recent injury.  Have certain medical conditions, such as hemophilia,  ochronosis, amyloidosis, or hormonal disorders.  Have low blood magnesium levels.  What are the signs or symptoms? Symptoms of this condition include:  Pain in a joint. The pain may: ? Be intense and constant. ? Come on quickly. ? Get worse with movement. ? Last from several days to a few weeks.  Redness, swelling, and warmth at the joint.  Stiffness of the joint.  How is this diagnosed? To diagnose this condition, your health care provider will use a needle to remove fluid from the joint. The fluid will be examined under a microscope to check for the crystals that cause CPPD. You may also have imaging tests, such as:  X-rays.  Ultrasound.  How is this treated? There is no way to remove the crystals from the joint and no way to cure this condition. However, treatment can relieve symptoms and improve joint function. Treatment may include:  Nonsteroidal anti-inflammatory drugs (NSAIDs) to reduce inflammation and pain.  Medicines to help prevent attacks.  Injections of medicine (cortisone) into the joint to reduce pain and swelling.  Physical therapy to improve joint function.  Follow these instructions at home:  Take medicines only as directed by your health care provider.  Rest the affected joints until your symptoms start to go away.  Keep your affected joints raised (elevated) when possible. This will help to reduce swelling.  If directed, apply ice to the affected area: ? Put ice in a plastic bag. ?  Place a towel between your skin and the bag. ? Leave the ice on for 20 minutes, 2-3 times per day.  If the painful joint is in your leg, use crutches as directed by your health care provider.  When your symptoms start to go away, begin to exercise regularly or do physical therapy. Talk with your health care provider or physical therapist about what types of exercise are safe for you. Low-impact exercise may be best. This includes walking, swimming, bicycling, and water  aerobics.  Maintain a healthy weight so your joints do not need to bear more weight than necessary. Contact a health care provider if:  You have an increase in joint pain that is not relieved with medicine.  Your joint becomes more red, swollen, or stiff.  You have a fever.  You have a skin rash. This information is not intended to replace advice given to you by your health care provider. Make sure you discuss any questions you have with your health care provider. Document Released: 04/29/2004 Document Revised: 01/13/2016 Document Reviewed: 07/15/2014 Elsevier Interactive Patient Education  2018 [employer].   Upper Respiratory Infection, Adult Most upper respiratory infections (URIs) are caused by a virus. A URI affects the nose, throat, and upper air passages. The most common type of URI is often called "the common cold." Follow these instructions at home:  Take medicines only as told by your doctor.  Gargle warm saltwater or take cough drops to comfort your throat as told by your doctor.  Use a warm mist humidifier or inhale steam from a shower to increase air moisture. This may make it easier to breathe.  Drink enough fluid to keep your pee (urine) clear or pale yellow.  Eat soups and other clear broths.  Have a healthy diet.  Rest as needed.  Go back to work when your fever is gone or your doctor says it is okay. ? You may need to stay home longer to avoid giving your URI to others. ? You can also wear a face mask and wash your hands often to prevent spread of the virus.  Use your inhaler more if you have asthma.  Do not use any tobacco products, including cigarettes, chewing tobacco, or electronic cigarettes. If you need help quitting, ask your doctor. Contact a doctor if:  You are getting worse, not better.  Your symptoms are not helped by medicine.  You have chills.  You are getting more short of breath.  You have brown or red mucus.  You have yellow or  brown discharge from your nose.  You have pain in your face, especially when you bend forward.  You have a fever.  You have puffy (swollen) neck glands.  You have pain while swallowing.  You have white areas in the back of your throat. Get help right away if:  You have very bad or constant: ? Headache. ? Ear pain. ? Pain in your forehead, behind your eyes, and over your cheekbones (sinus pain). ? Chest pain.  You have long-lasting (chronic) lung disease and any of the following: ? Wheezing. ? Long-lasting cough. ? Coughing up blood. ? A change in your usual mucus.  You have a stiff neck.  You have changes in your: ? Vision. ? Hearing. ? Thinking. ? Mood. This information is not intended to replace advice given to you by your health care provider. Make sure you discuss any questions you have with your health care provider. Document Released: 01/24/2008 Document Revised: 04/09/2016  Document Reviewed: 11/12/2013 Elsevier Interactive Patient Education  [employer].

## 2017-05-30 NOTE — Assessment & Plan Note (Signed)
The patient states that she has been having right wrist pain and swelling for the past 4 days. The pain is 9/10, and radiates to mid right forearm, with accompanied tingling. Denies any trauma.  She is unable to move her arm or use it due to the pain and having difficulty picking up objects with the hand or rotating her hand. She is left hand dominant.   On exam, the patient is unable to supinate and she has difficulty flexing at her wrists. Point of care ultrasound showed an area of effusion at the radiocarpal joint. The patient was offered for the effusion to be aspirated and sent for analysis. The acute nature of presence of swelling and presence of effusion make it likely that the patient has CPPD. However, the patient refused any procedures at this time.  -Patient was prescribed ibuprofen 600mg  tid for 3 days.

## 2017-05-30 NOTE — Progress Notes (Signed)
   CC: Right arm pain and cold  HPI:  [name] is a 56 y.o. with pmh of uncontrolled htn thought to be due to hyperaldosteronism, gerd, and diastolic dysfunction without heart failure who presents to the clinic for right arm pain and cold. Please see assessment and plan for additional details.   Past Medical History:  Diagnosis Date  . Anemia   . CHF (congestive heart failure) ([place])   . Chronic bronchitis ([place])    "probably once/yr" (05/12/2013)  . Diastolic heart failure   . Dyslipidemia    pt denies this hx on 05/12/2013  . Exertional shortness of breath   . GERD (gastroesophageal reflux disease)   . Hypertension   . Hypokalemia   . Venous stasis ulcers (HCC)    Review of Systems:    Review of Systems  Constitutional: Positive for chills. Negative for fever.  Respiratory: Positive for cough.   Cardiovascular:       Chest congestion  Gastrointestinal: Positive for diarrhea, nausea and vomiting.  Musculoskeletal: Positive for joint pain (right wrist).   Physical Exam:  There were no vitals filed for this visit.  Physical Exam  Constitutional: She appears well-developed and well-nourished.  HENT:  Mouth/Throat: No oropharyngeal exudate, posterior oropharyngeal edema or posterior oropharyngeal erythema.  No sinus tenderness noted. No anterior or posterior cervical lymphadenopathy  Cardiovascular: Normal rate, regular rhythm, normal heart sounds and intact distal pulses.   Pulmonary/Chest: Effort normal and breath sounds normal. No respiratory distress. She has no wheezes.  Abdominal: Soft. Bowel sounds are normal. She exhibits no distension. There is no tenderness.  Musculoskeletal:       Right wrist: She exhibits decreased range of motion, tenderness, swelling, effusion and laceration.  No erythema noted. Patient had difficulty with supination of right hand and flexion.  Lymphadenopathy:    She has no cervical adenopathy.  Psychiatric: She has a normal mood  and affect. Her behavior is normal. Judgment and thought content normal.    Assessment & Plan:   See Encounters Tab for problem based charting.  Patient seen with Dr. Evette Doffing

## 2017-05-30 NOTE — Assessment & Plan Note (Signed)
The patient states that she has head congestion that has been presnt for 3 days. She has been sneezing, has hoarseness, productive cough, and frontal headaches. She has been bringing up clear sputum. She also has chills, nausea, chest congestion, body aches, vomited once this morning 05/30/17.  The patient is afebrile. The neighbor's child has been sick.    The patient likely has a viral upper respiratory infection due to the nature of her symptoms and duration. Supportive therapy was recommended.   -nasal sprays -chicken noodle soup -lots of fluids -ibuprofen

## 2017-05-30 NOTE — Assessment & Plan Note (Signed)
The patient's blood pressure during this visit was 177/94 and repeat bp was 158/92. The patient notes that she vomited her bp medicine this morning. She is currently on spironolactone 100mg  qd and amlodipine 10mg  qd. -Instructed to take her bp medication today as she vomited them this morning.

## 2017-05-31 NOTE — Progress Notes (Signed)
Internal Medicine Clinic Attending  I saw and evaluated the patient.  I personally confirmed the key portions of the history and exam documented by Dr. Maricela Bo and I reviewed pertinent patient test results.  The assessment, diagnosis, and plan were formulated together and I agree with the documentation in the resident's note.  Acute right wrist monarticular arthritis. Low risk for septic joint based on story and exam. There is a moderate effusion, I am most suspicious for crystal disease, especially CPPD. Plan is for conservative NSAID therapy for now. Patient declined arthrocentesis which is fine. Gave instructions to return if no improvement or if new fevers.

## 2017-07-10 ENCOUNTER — Other Ambulatory Visit: Payer: Self-pay | Admitting: Internal Medicine

## 2017-07-10 DIAGNOSIS — I152 Hypertension secondary to endocrine disorders: Secondary | ICD-10-CM

## 2017-10-17 ENCOUNTER — Encounter (INDEPENDENT_AMBULATORY_CARE_PROVIDER_SITE_OTHER): Payer: Self-pay

## 2017-10-17 ENCOUNTER — Encounter: Payer: Self-pay | Admitting: Internal Medicine

## 2017-10-17 ENCOUNTER — Other Ambulatory Visit: Payer: Self-pay

## 2017-10-17 ENCOUNTER — Ambulatory Visit: Payer: Medicaid Other | Admitting: Internal Medicine

## 2017-10-17 VITALS — BP 169/97 | HR 92 | Temp 98.8°F | Ht 68.0 in | Wt 244.0 lb

## 2017-10-17 DIAGNOSIS — Z79899 Other long term (current) drug therapy: Secondary | ICD-10-CM | POA: Diagnosis not present

## 2017-10-17 DIAGNOSIS — Z888 Allergy status to other drugs, medicaments and biological substances status: Secondary | ICD-10-CM

## 2017-10-17 DIAGNOSIS — E2609 Other primary hyperaldosteronism: Secondary | ICD-10-CM | POA: Diagnosis not present

## 2017-10-17 DIAGNOSIS — I152 Hypertension secondary to endocrine disorders: Secondary | ICD-10-CM | POA: Diagnosis present

## 2017-10-17 DIAGNOSIS — Z0000 Encounter for general adult medical examination without abnormal findings: Secondary | ICD-10-CM

## 2017-10-17 MED ORDER — LOSARTAN POTASSIUM 25 MG PO TABS: 25.0000 mg | ORAL_TABLET | Freq: Every day | ORAL | 2 refills | Status: DC

## 2017-10-17 NOTE — Patient Instructions (Addendum)
Ms. Altmann,   Please start taking losartan 25 mg daily. Please come back in 4 weeks to recheck your blood pressure and recheck blood work as well.   Please continue taking your spironolactone and amlodipine as usual.   Please call us if you have any questions.

## 2017-10-18 ENCOUNTER — Encounter: Payer: Self-pay | Admitting: Internal Medicine

## 2017-10-18 LAB — BMP8+ANION GAP
Anion Gap: 16 mmol/L (ref 10.0–18.0)
BUN / CREAT RATIO: 19 (ref 9–23)
BUN: 22 mg/dL (ref 6–24)
CALCIUM: 8.9 mg/dL (ref 8.7–10.2)
CHLORIDE: 100 mmol/L (ref 96–106)
CO2: 23 mmol/L (ref 20–29)
CREATININE: 1.17 mg/dL — AB (ref 0.57–1.00)
GFR, EST AFRICAN AMERICAN: 60 mL/min/{1.73_m2} (ref >59)
GFR, EST NON AFRICAN AMERICAN: 52 mL/min/{1.73_m2} — AB (ref >59)
Glucose: 92 mg/dL (ref 65–99)
Potassium: 4.9 mmol/L (ref 3.5–5.2)
Sodium: 139 mmol/L (ref 134–144)

## 2017-10-18 LAB — HEMOGLOBIN A1C
Est. average glucose Bld gHb Est-mCnc: 100 mg/dL
Hgb A1c MFr Bld: 5.1 % (ref 4.8–5.6)

## 2017-10-18 NOTE — Assessment & Plan Note (Signed)
-   Referral for screening mammogram  - A1c

## 2017-10-18 NOTE — Progress Notes (Signed)
   CC: HTN follow up   HPI:  [name] is a 57 y.o. female with PMH listed below presents to clinic for HTN follow-up. Please problem based assessment and plan for further details.   Past Medical History:  Diagnosis Date  . Anemia   . CHF (congestive heart failure) ([place])   . Chronic bronchitis ([place])    "probably once/yr" (05/12/2013)  . Diastolic heart failure   . Dyslipidemia    pt denies this hx on 05/12/2013  . Exertional shortness of breath   . GERD (gastroesophageal reflux disease)   . Hypertension   . Hypokalemia   . Venous stasis ulcers ([facility])    Review of Systems:   Review of Systems  Constitutional: Negative for chills, fever and malaise/fatigue.  Respiratory: Negative for shortness of breath.   Cardiovascular: Negative for chest pain, palpitations and leg swelling.  Neurological: Negative for focal weakness and headaches.    Physical Exam:  Vitals:   10/17/17 1348  BP: (!) 169/97  Pulse: 92  Temp: 98.8 F (37.1 C)  TempSrc: Oral  SpO2: 100%  Weight: 244 lb (110.7 kg)  Height: 5\' 8"  (1.727 m)   General: Pleasant female, well-developed, well-nourished, no acute distress Cardiac: regular rate and rhythm, nl S1/S2, no murmurs, rubs or gallops  Pulm: CTAB, no wheezes or crackles, no increased work of breathing  Ext: warm and well perfused, no peripheral edema    Assessment & Plan:   See Encounters Tab for problem based charting.  Patient discussed with Dr. Dareen Piano

## 2017-10-18 NOTE — Assessment & Plan Note (Signed)
Patient has a history of secondary hypertension due to primary hyperaldosteronism.  She is currently on Spironolactone 100 daily and amlodipine 10 daily.  However, BP remains uncontrolled, BP today 169/97.  She reports compliance with her medications.  She is currently asymptomatic.  Given she is allergic to lisinopril (cough), will start an orbit this time and will use as her blood pressure in 4 weeks.  - Refilled spironolactone and amlodipine  - Start losartan 25 mg daily - Follow-up in 4 weeks for BMP recheck and BMP - Encouraged smoking cessation

## 2017-10-23 NOTE — Progress Notes (Signed)
Internal Medicine Clinic Attending  Case discussed with Dr. Santos-Sanchez at the time of the visit.  We reviewed the resident's history and exam and pertinent patient test results.  I agree with the assessment, diagnosis, and plan of care documented in the resident's note.    

## 2017-11-05 ENCOUNTER — Other Ambulatory Visit: Payer: Self-pay | Admitting: Internal Medicine

## 2017-11-05 DIAGNOSIS — I152 Hypertension secondary to endocrine disorders: Secondary | ICD-10-CM

## 2017-11-05 DIAGNOSIS — E785 Hyperlipidemia, unspecified: Secondary | ICD-10-CM

## 2017-11-05 NOTE — Telephone Encounter (Signed)
Patient requesting refills on all medicine, especially blood pressure medicine

## 2017-11-06 MED ORDER — SPIRONOLACTONE 100 MG PO TABS: 100.0000 mg | ORAL_TABLET | Freq: Every day | ORAL | 1 refills | Status: DC

## 2017-11-06 MED ORDER — AMLODIPINE BESYLATE 10 MG PO TABS: 10.0000 mg | ORAL_TABLET | Freq: Every day | ORAL | 3 refills | Status: DC

## 2017-11-06 MED ORDER — ATORVASTATIN CALCIUM 40 MG PO TABS: 40.0000 mg | ORAL_TABLET | Freq: Every day | ORAL | 3 refills | Status: DC

## 2017-11-14 ENCOUNTER — Ambulatory Visit: Payer: Medicaid Other

## 2017-11-14 ENCOUNTER — Encounter: Payer: Self-pay | Admitting: Internal Medicine

## 2017-11-20 ENCOUNTER — Encounter: Payer: Self-pay | Admitting: Internal Medicine

## 2017-12-17 ENCOUNTER — Other Ambulatory Visit: Payer: Self-pay | Admitting: Internal Medicine

## 2017-12-17 DIAGNOSIS — Z1231 Encounter for screening mammogram for malignant neoplasm of breast: Secondary | ICD-10-CM

## 2018-05-17 ENCOUNTER — Other Ambulatory Visit: Payer: Self-pay | Admitting: Internal Medicine

## 2018-05-17 ENCOUNTER — Telehealth: Payer: Self-pay | Admitting: Internal Medicine

## 2018-05-17 DIAGNOSIS — I152 Hypertension secondary to endocrine disorders: Secondary | ICD-10-CM

## 2018-05-20 NOTE — Telephone Encounter (Signed)
Patient has been dismissed from clinic.

## 2018-09-05 ENCOUNTER — Emergency Department (HOSPITAL_COMMUNITY): Payer: Medicare Other

## 2018-09-05 ENCOUNTER — Other Ambulatory Visit: Payer: Self-pay

## 2018-09-05 ENCOUNTER — Encounter (HOSPITAL_COMMUNITY): Payer: Self-pay | Admitting: Emergency Medicine

## 2018-09-05 ENCOUNTER — Observation Stay (HOSPITAL_COMMUNITY)
Admission: EM | Admit: 2018-09-05 | Discharge: 2018-09-07 | Disposition: A | Discharge status: Home / Self Care | Payer: Medicare Other | Attending: Internal Medicine | Admitting: Internal Medicine

## 2018-09-05 ENCOUNTER — Observation Stay (HOSPITAL_COMMUNITY): Payer: Medicare Other

## 2018-09-05 DIAGNOSIS — E785 Hyperlipidemia, unspecified: Secondary | ICD-10-CM | POA: Insufficient documentation

## 2018-09-05 DIAGNOSIS — E669 Obesity, unspecified: Secondary | ICD-10-CM | POA: Diagnosis not present

## 2018-09-05 DIAGNOSIS — I5032 Chronic diastolic (congestive) heart failure: Secondary | ICD-10-CM | POA: Diagnosis not present

## 2018-09-05 DIAGNOSIS — Z79899 Other long term (current) drug therapy: Secondary | ICD-10-CM | POA: Insufficient documentation

## 2018-09-05 DIAGNOSIS — N183 Chronic kidney disease, stage 3 unspecified: Secondary | ICD-10-CM

## 2018-09-05 DIAGNOSIS — Z7982 Long term (current) use of aspirin: Secondary | ICD-10-CM | POA: Diagnosis not present

## 2018-09-05 DIAGNOSIS — R079 Chest pain, unspecified: Secondary | ICD-10-CM | POA: Diagnosis present

## 2018-09-05 DIAGNOSIS — I10 Essential (primary) hypertension: Secondary | ICD-10-CM

## 2018-09-05 DIAGNOSIS — R0600 Dyspnea, unspecified: Secondary | ICD-10-CM | POA: Diagnosis present

## 2018-09-05 DIAGNOSIS — R112 Nausea with vomiting, unspecified: Secondary | ICD-10-CM | POA: Diagnosis present

## 2018-09-05 DIAGNOSIS — E162 Hypoglycemia, unspecified: Secondary | ICD-10-CM | POA: Diagnosis not present

## 2018-09-05 DIAGNOSIS — I130 Hypertensive heart and chronic kidney disease with heart failure and stage 1 through stage 4 chronic kidney disease, or unspecified chronic kidney disease: Secondary | ICD-10-CM | POA: Insufficient documentation

## 2018-09-05 DIAGNOSIS — R109 Unspecified abdominal pain: Secondary | ICD-10-CM

## 2018-09-05 DIAGNOSIS — Z6834 Body mass index (BMI) 34.0-34.9, adult: Secondary | ICD-10-CM | POA: Insufficient documentation

## 2018-09-05 DIAGNOSIS — R072 Precordial pain: Secondary | ICD-10-CM

## 2018-09-05 DIAGNOSIS — I160 Hypertensive urgency: Principal | ICD-10-CM | POA: Diagnosis present

## 2018-09-05 LAB — CBG MONITORING, ED: Glucose-Capillary: 100 mg/dL — ABNORMAL HIGH (ref 70–99)

## 2018-09-05 LAB — HEPATIC FUNCTION PANEL
ALT: 14 U/L (ref 0–44)
AST: 18 U/L (ref 15–41)
Albumin: 3.5 g/dL (ref 3.5–5.0)
Alkaline Phosphatase: 46 U/L (ref 38–126)
BILIRUBIN TOTAL: 0.9 mg/dL (ref 0.3–1.2)
Bilirubin, Direct: <0.1 mg/dL (ref 0.0–0.2)
Total Protein: 7 g/dL (ref 6.5–8.1)

## 2018-09-05 LAB — CBC
HCT: 39.6 % (ref 36.0–46.0)
Hemoglobin: 11.4 g/dL — ABNORMAL LOW (ref 12.0–15.0)
MCH: 27.1 pg (ref 26.0–34.0)
MCHC: 28.8 g/dL — AB (ref 30.0–36.0)
MCV: 94.3 fL (ref 80.0–100.0)
PLATELETS: 264 10*3/uL (ref 150–400)
RBC: 4.2 MIL/uL (ref 3.87–5.11)
RDW: 12.9 % (ref 11.5–15.5)
WBC: 6.9 10*3/uL (ref 4.0–10.5)
nRBC: 0 % (ref 0.0–0.2)

## 2018-09-05 LAB — BASIC METABOLIC PANEL
Anion gap: 11 (ref 5–15)
BUN: 19 mg/dL (ref 6–20)
CHLORIDE: 103 mmol/L (ref 98–111)
CO2: 28 mmol/L (ref 22–32)
Calcium: 9 mg/dL (ref 8.9–10.3)
Creatinine, Ser: 1.25 mg/dL — ABNORMAL HIGH (ref 0.44–1.00)
GFR calc Af Amer: 55 mL/min — ABNORMAL LOW (ref >60)
GFR, EST NON AFRICAN AMERICAN: 48 mL/min — AB (ref >60)
GLUCOSE: 57 mg/dL — AB (ref 70–99)
POTASSIUM: 3.6 mmol/L (ref 3.5–5.1)
Sodium: 142 mmol/L (ref 135–145)

## 2018-09-05 LAB — I-STAT TROPONIN, ED
TROPONIN I, POC: 0.02 ng/mL (ref 0.00–0.08)
Troponin i, poc: 0.01 ng/mL (ref 0.00–0.08)

## 2018-09-05 LAB — I-STAT BETA HCG BLOOD, ED (MC, WL, AP ONLY): I-stat hCG, quantitative: <5 m[IU]/mL (ref <5)

## 2018-09-05 LAB — TROPONIN I: Troponin I: <0.03 ng/mL (ref <0.03)

## 2018-09-05 LAB — BRAIN NATRIURETIC PEPTIDE: B Natriuretic Peptide: 111.3 pg/mL — ABNORMAL HIGH (ref 0.0–100.0)

## 2018-09-05 LAB — LIPASE, BLOOD: Lipase: 59 U/L — ABNORMAL HIGH (ref 11–51)

## 2018-09-05 MED ORDER — AMLODIPINE BESYLATE 10 MG PO TABS: 10.0000 mg | ORAL_TABLET | Freq: Every day | ORAL | Status: DC

## 2018-09-05 MED ORDER — SODIUM CHLORIDE 0.9% FLUSH
3.0000 mL | Freq: Once | INTRAVENOUS | Status: AC
  Administered 2018-09-05: 3 mL via INTRAVENOUS

## 2018-09-05 MED ORDER — FAMOTIDINE 20 MG PO TABS
20.0000 mg | ORAL_TABLET | Freq: Once | ORAL | Status: AC
  Administered 2018-09-05: 20 mg via ORAL
  Filled 2018-09-05: qty 1

## 2018-09-05 MED ORDER — ENOXAPARIN SODIUM 40 MG/0.4ML ~~LOC~~ SOLN
40.0000 mg | SUBCUTANEOUS | Status: DC
  Administered 2018-09-05 – 2018-09-06 (×2): 40 mg via SUBCUTANEOUS
  Filled 2018-09-05 (×3): qty 0.4

## 2018-09-05 MED ORDER — LABETALOL HCL 5 MG/ML IV SOLN
20.0000 mg | Freq: Once | INTRAVENOUS | Status: AC
  Administered 2018-09-05: 20 mg via INTRAVENOUS
  Filled 2018-09-05: qty 4

## 2018-09-05 MED ORDER — ACETAMINOPHEN 500 MG PO TABS
1000.0000 mg | ORAL_TABLET | Freq: Once | ORAL | Status: AC
  Administered 2018-09-05: 1000 mg via ORAL
  Filled 2018-09-05: qty 2

## 2018-09-05 MED ORDER — ALUM & MAG HYDROXIDE-SIMETH 200-200-20 MG/5ML PO SUSP
15.0000 mL | Freq: Once | ORAL | Status: AC
  Administered 2018-09-05: 15 mL via ORAL
  Filled 2018-09-05: qty 30

## 2018-09-05 MED ORDER — ATORVASTATIN CALCIUM 40 MG PO TABS
40.0000 mg | ORAL_TABLET | Freq: Every day | ORAL | Status: DC
  Administered 2018-09-06 – 2018-09-07 (×2): 40 mg via ORAL
  Filled 2018-09-05 (×2): qty 1

## 2018-09-05 MED ORDER — ASPIRIN 81 MG PO TBEC: 81.0000 mg | DELAYED_RELEASE_TABLET | Freq: Every day | ORAL | Status: DC

## 2018-09-05 MED ORDER — ACETAMINOPHEN 650 MG RE SUPP: 650.0000 mg | Freq: Four times a day (QID) | RECTAL | Status: DC | PRN

## 2018-09-05 MED ORDER — HYDRALAZINE HCL 20 MG/ML IJ SOLN
10.0000 mg | INTRAMUSCULAR | Status: DC | PRN
  Administered 2018-09-05: 10 mg via INTRAVENOUS
  Filled 2018-09-05: qty 1

## 2018-09-05 MED ORDER — NITROGLYCERIN IN D5W 200-5 MCG/ML-% IV SOLN
0.0000 ug/min | INTRAVENOUS | Status: DC
  Administered 2018-09-05: 5 ug/min via INTRAVENOUS
  Administered 2018-09-06: 155 ug/min via INTRAVENOUS
  Filled 2018-09-05 (×2): qty 250

## 2018-09-05 MED ORDER — AMLODIPINE BESYLATE 5 MG PO TABS
10.0000 mg | ORAL_TABLET | Freq: Once | ORAL | Status: AC
  Administered 2018-09-05: 10 mg via ORAL
  Filled 2018-09-05: qty 2

## 2018-09-05 MED ORDER — ASPIRIN EC 81 MG PO TBEC
81.0000 mg | DELAYED_RELEASE_TABLET | Freq: Every day | ORAL | Status: DC
  Administered 2018-09-06 – 2018-09-07 (×2): 81 mg via ORAL
  Filled 2018-09-05 (×2): qty 1

## 2018-09-05 MED ORDER — ACETAMINOPHEN 325 MG PO TABS
650.0000 mg | ORAL_TABLET | Freq: Four times a day (QID) | ORAL | Status: DC | PRN
  Administered 2018-09-06 (×2): 650 mg via ORAL
  Filled 2018-09-05 (×2): qty 2

## 2018-09-05 NOTE — ED Provider Notes (Addendum)
[facility] EMERGENCY DEPARTMENT Provider Note   CSN: [id number] Arrival date & time: 09/05/18  1252     History   Chief Complaint Chief Complaint  Patient presents with  . Chest Pain  . Shortness of Breath    HPI [name] is a 58 y.o. female.  Patient with intermittent episodes chest pain and sob in the past few days. Symptoms occur sporadically, at rest, without specific exacerbating or alleviating factors. Last several minutes per episode. No palpitations. Denies heartburn. No constant and/or pleuritic pain. Denies cough or uri symptoms. No fever or chills. Unspecified fam hx 'heart problems' in older age. +smoker. bil lower leg/foot/ankle swelling - pt denies any acute or abrupt worsening. Denies orthopnea/pnd. States compliant w home meds, including bp meds, although bp very high currently.   The history is provided by the patient.  Chest Pain  Associated symptoms: shortness of breath   Associated symptoms: no abdominal pain, no cough, no fever, no headache, no palpitations and no vomiting   Shortness of Breath  Associated symptoms: chest pain   Associated symptoms: no abdominal pain, no cough, no fever, no headaches, no neck pain, no rash, no sore throat and no vomiting     Past Medical History:  Diagnosis Date  . Anemia   . CHF (congestive heart failure) ([place])   . Chronic bronchitis ([place])    "probably once/yr" (05/12/2013)  . Diastolic heart failure   . Dyslipidemia    pt denies this hx on 05/12/2013  . Exertional shortness of breath   . GERD (gastroesophageal reflux disease)   . Hypertension   . Hypokalemia   . Venous stasis ulcers [facility])     Patient Active Problem List   Diagnosis Date Noted  . Wrist joint effusion, right 05/30/2017  . Hypertension due to endocrine disorder 01/17/2017  . Hyperaldosteronism ([place]) 01/17/2017  . AKI (acute kidney injury) ([place]) 10/19/2015  . Varicose veins of left lower extremity with complications  40/98/1191  . Anemia 12/25/2014  . Encounter for preventive care 11/11/2014  . Tobacco abuse 11/17/2013  . Diastolic dysfunction without heart failure 06/11/2013  . GERD (gastroesophageal reflux disease) 06/11/2013  . Severe uncontrolled hypertension 05/12/2013  . Hyperlipidemia 05/12/2013    Past Surgical History:  Procedure Laterality Date  . IR US GUIDE VASC ACCESS RIGHT  02/27/2017  . IR VENOGRAM ADRENAL BI  02/27/2017  . IR VENOGRAM HEPATIC WO HEMODYNAMIC EVALUATION  02/27/2017  . IR VENOGRAM RENAL BI  02/27/2017  . IR VENOUS SAMPLING  02/27/2017  . IR VENOUS SAMPLING  02/27/2017  . VAGINAL HYSTERECTOMY  1990's     OB History   No obstetric history on file.      Home Medications    Prior to Admission medications   Medication Sig Start Date End Date Taking? Authorizing Provider  amLODipine (NORVASC) 10 MG tablet Take 1 tablet (10 mg total) by mouth daily. 11/06/17   Welford Roche, MD  aspirin EC 81 MG EC tablet Take 1 tablet (81 mg total) by mouth daily. 06/13/13   Dorian Heckle, MD  atorvastatin (LIPITOR) 40 MG tablet Take 1 tablet (40 mg total) by mouth daily. 11/06/17   Santos-Sanchez, Merlene Morse, MD  losartan (COZAAR) 25 MG tablet TAKE 1 TABLET(25 MG) BY MOUTH DAILY 05/17/18   Oda Kilts, MD  spironolactone (ALDACTONE) 100 MG tablet Take 1 tablet (100 mg total) by mouth daily. 11/06/17   Welford Roche, MD    Family History Family History  Problem Relation  Age of Onset  . Hypertension Mother   . Hypertension Sister   . Diabetes Sister   . Hypertension Brother   . Diabetes Sister     Social History Social History   Tobacco Use  . Smoking status: Current Every Day Smoker    Packs/day: 0.50    Years: 36.00    Pack years: 18.00    Types: Cigarettes  . Smokeless tobacco: Never Used  . Tobacco comment: trying to cut back.  Substance Use Topics  . Alcohol use: Yes    Alcohol/week: 7.0 standard drinks    Types: 7 Cans of beer per week     Comment: Beer once a week.  . Drug use: No    Types: Cocaine    Comment: 05/12/2013 "tried it a few days ago; didn't like it"     Allergies   Lisinopril   Review of Systems Review of Systems  Constitutional: Negative for fever.  HENT: Negative for sore throat.   Eyes: Negative for redness.  Respiratory: Positive for shortness of breath. Negative for cough.   Cardiovascular: Positive for chest pain. Negative for palpitations.  Gastrointestinal: Negative for abdominal pain and vomiting.  Endocrine: Negative for polyuria.  Genitourinary: Negative for flank pain.  Musculoskeletal: Negative for neck pain.  Skin: Negative for rash.  Neurological: Negative for headaches.  Hematological: Does not bruise/bleed easily.  Psychiatric/Behavioral: Negative for confusion.     Physical Exam Updated Vital Signs BP (!) 227/96   Pulse 60   Temp 98.8 F (37.1 C) (Oral)   Resp 17   SpO2 100%   Physical Exam Vitals signs and nursing note reviewed.  Constitutional:      Appearance: Normal appearance. She is well-developed.  HENT:     Head: Atraumatic.     Nose: Nose normal.     Mouth/Throat:     Mouth: Mucous membranes are moist.  Eyes:     General: No scleral icterus.    Conjunctiva/sclera: Conjunctivae normal.     Pupils: Pupils are equal, round, and reactive to light.  Neck:     Musculoskeletal: Normal range of motion and neck supple. No neck rigidity or muscular tenderness.     Trachea: No tracheal deviation.  Cardiovascular:     Rate and Rhythm: Normal rate and regular rhythm.     Pulses: Normal pulses.     Heart sounds: Normal heart sounds. No murmur. No friction rub. No gallop.   Pulmonary:     Effort: Pulmonary effort is normal. No respiratory distress.     Breath sounds: Normal breath sounds.  Chest:     Chest wall: No tenderness.  Abdominal:     General: Bowel sounds are normal. There is no distension.     Palpations: Abdomen is soft.     Tenderness: There is no  abdominal tenderness. There is no guarding.  Genitourinary:    Comments: No cva tenderness.  Musculoskeletal:        General: No tenderness.     Right lower leg: Edema present.     Left lower leg: Edema present.  Skin:    General: Skin is warm and dry.     Findings: No rash.  Neurological:     Mental Status: She is alert.     Comments: Alert, speech normal.   Psychiatric:        Mood and Affect: Mood normal.      ED Treatments / Results  Labs (all labs ordered are listed, but only abnormal  results are displayed) Results for orders placed or performed during the hospital encounter of 69/48/54  Basic metabolic panel  Result Value Ref Range   Sodium 142 135 - 145 mmol/L   Potassium 3.6 3.5 - 5.1 mmol/L   Chloride 103 98 - 111 mmol/L   CO2 28 22 - 32 mmol/L   Glucose, Bld 57 (L) 70 - 99 mg/dL   BUN 19 6 - 20 mg/dL   Creatinine, Ser 1.25 (H) 0.44 - 1.00 mg/dL   Calcium 9.0 8.9 - 10.3 mg/dL   GFR calc non Af Amer 48 (L) >60 mL/min   GFR calc Af Amer 55 (L) >60 mL/min   Anion gap 11 5 - 15  CBC  Result Value Ref Range   WBC 6.9 4.0 - 10.5 K/uL   RBC 4.20 3.87 - 5.11 MIL/uL   Hemoglobin 11.4 (L) 12.0 - 15.0 g/dL   HCT 39.6 36.0 - 46.0 %   MCV 94.3 80.0 - 100.0 fL   MCH 27.1 26.0 - 34.0 pg   MCHC 28.8 (L) 30.0 - 36.0 g/dL   RDW 12.9 11.5 - 15.5 %   Platelets 264 150 - 400 K/uL   nRBC 0.0 0.0 - 0.2 %  Brain natriuretic peptide  Result Value Ref Range   B Natriuretic Peptide 111.3 (H) 0.0 - 100.0 pg/mL  I-stat troponin, ED  Result Value Ref Range   Troponin i, poc 0.01 0.00 - 0.08 ng/mL   Comment 3          I-Stat beta hCG blood, ED  Result Value Ref Range   I-stat hCG, quantitative <5.0 <5 mIU/mL   Comment 3           Dg Chest 2 View  Result Date: 09/05/2018 CLINICAL DATA:  Chest pain for 2 days EXAM: CHEST - 2 VIEW COMPARISON:  05/12/2015 FINDINGS: Cardiac shadow is mildly prominent. Aortic calcifications are again seen. The lungs are clear. No bony abnormality is  noted. IMPRESSION: No active cardiopulmonary disease. Electronically Signed   By: Inez Catalina M.D.   On: 09/05/2018 14:04    EKG EKG Interpretation  Date/Time:  Thursday September 05 2018 12:59:32 EST Ventricular Rate:  78 PR Interval:  220 QRS Duration: 96 QT Interval:  398 QTC Calculation: 453 R Axis:   -40 Text Interpretation:  Sinus rhythm with 1st degree A-V block Possible Left atrial enlargement Left axis deviation No significant change since last tracing Confirmed by Lajean Saver [id number]) on 09/05/2018 5:12:15 PM   Radiology Dg Chest 2 View  Result Date: 09/05/2018 CLINICAL DATA:  Chest pain for 2 days EXAM: CHEST - 2 VIEW COMPARISON:  05/12/2015 FINDINGS: Cardiac shadow is mildly prominent. Aortic calcifications are again seen. The lungs are clear. No bony abnormality is noted. IMPRESSION: No active cardiopulmonary disease. Electronically Signed   By: Inez Catalina M.D.   On: 09/05/2018 14:04    Procedures Procedures (including critical care time)  Medications Ordered in ED Medications  sodium chloride flush (NS) 0.9 % injection 3 mL (has no administration in time range)  alum & mag hydroxide-simeth (MAALOX/MYLANTA) 200-200-20 MG/5ML suspension 15 mL (has no administration in time range)  famotidine (PEPCID) tablet 20 mg (has no administration in time range)  acetaminophen (TYLENOL) tablet 1,000 mg (has no administration in time range)  labetalol (NORMODYNE,TRANDATE) injection 20 mg (has no administration in time range)     Initial Impression / Assessment and Plan / ED Course  I have reviewed the triage vital signs and  the nursing notes.  Pertinent labs & imaging results that were available during my care of the patient were reviewed by me and considered in my medical decision making (see chart for details).  Iv ns. Continuous pulse ox and monitor. o2 [place]. Ecg. Cxr.   Labs sent.  Reviewed nursing notes and prior charts for additional history.   cxr reviewed - no  pna.  Labs reviewed - chem normal. Initial trop normal.   pts blood pressure is very high, she indicates she took her losartan but is out of norvasc so didn't take - as such, will give dose of her norvasc as well.  Given new chest pain/dsypnea, and uncontrolled htn, will admit.  Medical service consulted for admission. Internal medicine indicates patient was dismissed from their practice as of 11-2017, and to call unassigned medicine for admission.     Final Clinical Impressions(s) / ED Diagnoses   Final diagnoses:  None    ED Discharge Orders    None           Lajean Saver, MD 09/05/18 1826

## 2018-09-05 NOTE — ED Notes (Signed)
Pt provided w/ food and beverage

## 2018-09-05 NOTE — ED Triage Notes (Signed)
Pt with chest pain and shortness of breath for 3 days with a hx of chf, she reports leg swelling. Pt is a/o x4.

## 2018-09-05 NOTE — H&P (Addendum)
History and Physical    [name] MVE:[id number] DOB: 18-Oct-1960 DOA: 09/05/2018  PCP: Patient, No Pcp Per Patient coming from: Home  Chief Complaint: Chest pain, shortness of breath  HPI: [name] is a 58 y.o. female with medical history significant of chronic diastolic congestive heart failure, hypertension, hyperlipidemia presenting to the hospital for evaluation of chest pain and shortness of breath.  Patient reports having shortness of breath for the past 3 days.  No fevers, chills, or cough.  States she has chronic intermittent chest pain but it happened today again while at the grocery store.  Chest pain is substernal, described as "feels full," nonradiating, intermittent, and each episode lasts 20 to 25 minutes.  No chest pain at present.  States she is on 3 different blood pressure medications but ran out of amlodipine a week ago.  She has not been checking her blood pressure at home.  Reports having multiple episodes of nonbloody nonbilious emesis last night and having periumbilical abdominal pain.  States she ate some noodles for breakfast and has not vomited since this morning.  Review of Systems: As per HPI otherwise 10 point review of systems negative.  Past Medical History:  Diagnosis Date  . Anemia   . CHF (congestive heart failure) ([place])   . Chronic bronchitis ([place])    "probably once/yr" (05/12/2013)  . Diastolic heart failure   . Dyslipidemia    pt denies this hx on 05/12/2013  . Exertional shortness of breath   . GERD (gastroesophageal reflux disease)   . Hypertension   . Hypokalemia   . Venous stasis ulcers (HCC)     Past Surgical History:  Procedure Laterality Date  . IR US GUIDE VASC ACCESS RIGHT  02/27/2017  . IR VENOGRAM ADRENAL BI  02/27/2017  . IR VENOGRAM HEPATIC WO HEMODYNAMIC EVALUATION  02/27/2017  . IR VENOGRAM RENAL BI  02/27/2017  . IR VENOUS SAMPLING  02/27/2017  . IR VENOUS SAMPLING  02/27/2017  . VAGINAL HYSTERECTOMY  1990's     reports that she has been smoking cigarettes. She has a 18.00 pack-year smoking history. She has never used smokeless tobacco. She reports current alcohol use of about 7.0 standard drinks of alcohol per week. She reports that she does not use drugs.  Allergies  Allergen Reactions  . Lisinopril Cough    Family History  Problem Relation Age of Onset  . Hypertension Mother   . Hypertension Sister   . Diabetes Sister   . Hypertension Brother   . Diabetes Sister     Prior to Admission medications   Medication Sig Start Date End Date Taking? Authorizing Provider  amLODipine (NORVASC) 10 MG tablet Take 1 tablet (10 mg total) by mouth daily. 11/06/17  Yes Santos-Sanchez, Merlene Morse, MD  aspirin EC 81 MG EC tablet Take 1 tablet (81 mg total) by mouth daily. 06/13/13  Yes Dorian Heckle, MD  spironolactone (ALDACTONE) 100 MG tablet Take 1 tablet (100 mg total) by mouth daily. 11/06/17  Yes Santos-Sanchez, Merlene Morse, MD  atorvastatin (LIPITOR) 40 MG tablet Take 1 tablet (40 mg total) by mouth daily. 11/06/17   Welford Roche, MD  losartan (COZAAR) 25 MG tablet TAKE 1 TABLET(25 MG) BY MOUTH DAILY 05/17/18   Oda Kilts, MD    Physical Exam: Vitals:   09/05/18 2055 09/05/18 2120 09/05/18 2155 09/05/18 2200  BP: (!) 196/93 (!) 193/91 (!) 196/91 (!) 192/92  Pulse: 68 64 64 64  Resp:  15 14 15   Temp:  TempSrc:      SpO2: 98% 100% 100% 100%  Weight:      Height:        Physical Exam  Constitutional: She is oriented to person, place, and time. She appears well-developed and well-nourished. No distress.  HENT:  Head: Normocephalic.  Mouth/Throat: Oropharynx is clear and moist.  Eyes: Right eye exhibits no discharge. Left eye exhibits no discharge.  Neck: Neck supple.  Cardiovascular: Normal rate, regular rhythm and intact distal pulses.  Pulmonary/Chest: Effort normal and breath sounds normal. No respiratory distress. She has no wheezes. She has no rales.  Abdominal: Soft. Bowel  sounds are normal. She exhibits distension. There is abdominal tenderness. There is guarding. There is no rebound.  Generalized tenderness to palpation, mostly periumbilical  Musculoskeletal:     Comments: Trace bilateral lower extremity edema  Neurological: She is alert and oriented to person, place, and time.  Skin: Skin is warm and dry. She is not diaphoretic.     Labs on Admission: I have personally reviewed following labs and imaging studies  CBC: Recent Labs  Lab 09/05/18 1302  WBC 6.9  HGB 11.4*  HCT 39.6  MCV 94.3  PLT 250   Basic Metabolic Panel: Recent Labs  Lab 09/05/18 1302  NA 142  K 3.6  CL 103  CO2 28  GLUCOSE 57*  BUN 19  CREATININE 1.25*  CALCIUM 9.0   GFR: Estimated Creatinine Clearance: 62.7 mL/min (A) (by C-G formula based on SCr of 1.25 mg/dL (H)). Liver Function Tests: Recent Labs  Lab 09/05/18 2000  AST 18  ALT 14  ALKPHOS 46  BILITOT 0.9  PROT 7.0  ALBUMIN 3.5   Recent Labs  Lab 09/05/18 2000  LIPASE 59*   No results for input(s): AMMONIA in the last 168 hours. Coagulation Profile: No results for input(s): INR, PROTIME in the last 168 hours. Cardiac Enzymes: Recent Labs  Lab 09/05/18 2000  TROPONINI <0.03   BNP (last 3 results) No results for input(s): PROBNP in the last 8760 hours. HbA1C: No results for input(s): HGBA1C in the last 72 hours. CBG: Recent Labs  Lab 09/05/18 1813  GLUCAP 100*   Lipid Profile: No results for input(s): CHOL, HDL, LDLCALC, TRIG, CHOLHDL, LDLDIRECT in the last 72 hours. Thyroid Function Tests: No results for input(s): TSH, T4TOTAL, FREET4, T3FREE, THYROIDAB in the last 72 hours. Anemia Panel: No results for input(s): VITAMINB12, FOLATE, FERRITIN, TIBC, IRON, RETICCTPCT in the last 72 hours. Urine analysis:    Component Value Date/Time   COLORURINE YELLOW 05/12/2013 1025   APPEARANCEUR CLEAR 05/12/2013 1025   LABSPEC 1.015 05/12/2013 1025   PHURINE 7.0 05/12/2013 1025   GLUCOSEU  NEGATIVE 05/12/2013 1025   HGBUR NEGATIVE 05/12/2013 1025   BILIRUBINUR NEGATIVE 05/12/2013 1025   KETONESUR NEGATIVE 05/12/2013 1025   PROTEINUR NEGATIVE 05/12/2013 1025   UROBILINOGEN 0.2 05/12/2013 1025   NITRITE NEGATIVE 05/12/2013 1025   LEUKOCYTESUR NEGATIVE 05/12/2013 1025    Radiological Exams on Admission: Dg Chest 2 View  Result Date: 09/05/2018 CLINICAL DATA:  Chest pain for 2 days EXAM: CHEST - 2 VIEW COMPARISON:  05/12/2015 FINDINGS: Cardiac shadow is mildly prominent. Aortic calcifications are again seen. The lungs are clear. No bony abnormality is noted. IMPRESSION: No active cardiopulmonary disease. Electronically Signed   By: Inez Catalina M.D.   On: 09/05/2018 14:04   Dg Abd 2 Views  Result Date: 09/05/2018 CLINICAL DATA:  Periumbilical abdominal pain for the past 3 days. EXAM: ABDOMEN - 2 VIEW  COMPARISON:  Abdomen CT dated 02/05/2017 and chest, abdomen pelvis radiographs dated 01/04/2013. FINDINGS: Mildly dilated small bowel loops and sigmoid colon in the left mid and lower abdomen with multiple air-fluid levels in the small bowel, colon and stomach. No free peritoneal air. Mild lumbosacral spine degenerative changes. IMPRESSION: Mild small bowel and colonic ileus or partial obstruction. A similar appearance can be seen with gastroenteritis. Electronically Signed   By: Claudie Revering M.D.   On: 09/05/2018 20:40    EKG: Independently reviewed.  Sinus rhythm, first-degree AV block.  Assessment/Plan Principal Problem:   Hypertensive urgency Active Problems:   Chest pain   Hypoglycemia without diagnosis of diabetes mellitus   CKD (chronic kidney disease) stage 3, GFR 30-59 ml/min (HCC)   Abdominal pain   Hypertensive urgency Blood pressure 219/110 on arrival.  Chest x-ray showing no active cardiopulmonary disease.  Patient continued to be hypertensive despite receiving pushes of IV labetalol, IV hydralazine, and oral amlodipine.  -Nitro drip -Continue to closely monitor  blood pressure -Resume home medications once blood pressure stable in the morning.  Atypical chest pain I-Stat troponin x2 negative.  EKG not suggestive of ACS.  No chest pain at present. -Cardiac monitoring -Continue to trend troponin -Echocardiogram -Continue to trend troponin  Abdominal pain Patient presenting with 1 day history of multiple episodes of nonbloody nonbilious emesis and periumbilical abdominal pain.  Noted to have abdominal distention on exam but otherwise appears comfortable and was eating.  Lipase not impressive (59).  LFTs normal.  Chest x-ray showing mild small bowel and colonic ileus or partial obstruction; findings can also be seen with viral gastroenteritis. -Clear liquid diet -IV Zofran PRN nausea -Tylenol PRN  Hypoglycemia Likely secondary to decreased p.o. intake.  Blood glucose 57 on arrival to the ED, improved with p.o. intake.  Patient is not a diabetic and not on insulin/oral hypoglycemics. -CBG checks every 2 hours -Hypoglycemia protocol   CKD 3 -Stable.  Creatinine 1.2, at baseline.  Diastolic congestive heart failure -Stable.  Currently not volume overloaded.  Hold home diuretics at this time.  Hyperlipidemia  -Continue home statin  DVT prophylaxis: Lovenox Code Status: Full code.  Discussed with the patient. Family Communication: No family available. Disposition Plan: Anticipate discharge in 1 to 2 days. Consults called: None Admission status: Observation, progressive care unit  Shela Leff MD [facility] Pager [phone]  If 7PM-7AM, please contact night-coverage [email or web address] Password [facility]  09/06/2018, 12:32 AM

## 2018-09-05 NOTE — ED Notes (Signed)
Dr. Ashok Cordia aware of pt's HTN

## 2018-09-05 NOTE — Progress Notes (Signed)
Patient SBP at 2055 hrs. 196/93 with MAP 124 pulse 64. 10 mg hydralazine PRN administered. SBP at 2120 hrs was 193/91 with MAP 121. At 2155 hrs SBP 196/91 with MAP 120. Dr. Marlowe Sax paged at 2205 hrs in reference to unchanged blood pressure. Provider response at 2213, new orders to be entered by Dr. Marlowe Sax. Will continue to monitor patient blood pressure.  Lucita Lora, RN

## 2018-09-06 ENCOUNTER — Observation Stay (HOSPITAL_BASED_OUTPATIENT_CLINIC_OR_DEPARTMENT_OTHER): Payer: Medicare Other

## 2018-09-06 DIAGNOSIS — N183 Chronic kidney disease, stage 3 unspecified: Secondary | ICD-10-CM

## 2018-09-06 DIAGNOSIS — E162 Hypoglycemia, unspecified: Secondary | ICD-10-CM | POA: Diagnosis not present

## 2018-09-06 DIAGNOSIS — R109 Unspecified abdominal pain: Secondary | ICD-10-CM

## 2018-09-06 DIAGNOSIS — I160 Hypertensive urgency: Secondary | ICD-10-CM | POA: Diagnosis not present

## 2018-09-06 DIAGNOSIS — R072 Precordial pain: Secondary | ICD-10-CM

## 2018-09-06 LAB — ECHOCARDIOGRAM COMPLETE
Height: 68 in
WEIGHTICAEL: 3672 [oz_av]

## 2018-09-06 LAB — MRSA PCR SCREENING: MRSA by PCR: NEGATIVE

## 2018-09-06 LAB — GLUCOSE, CAPILLARY
Glucose-Capillary: 144 mg/dL — ABNORMAL HIGH (ref 70–99)
Glucose-Capillary: 72 mg/dL (ref 70–99)
Glucose-Capillary: 89 mg/dL (ref 70–99)
Glucose-Capillary: 97 mg/dL (ref 70–99)
Glucose-Capillary: 98 mg/dL (ref 70–99)

## 2018-09-06 LAB — HIV ANTIBODY (ROUTINE TESTING W REFLEX): HIV Screen 4th Generation wRfx: NONREACTIVE

## 2018-09-06 LAB — TROPONIN I
Troponin I: <0.03 ng/mL (ref <0.03)
Troponin I: <0.03 ng/mL (ref <0.03)

## 2018-09-06 MED ORDER — DEXTROSE-NACL 5-0.9 % IV SOLN: INTRAVENOUS | Status: DC

## 2018-09-06 MED ORDER — AMLODIPINE BESYLATE 10 MG PO TABS
10.0000 mg | ORAL_TABLET | Freq: Every day | ORAL | Status: DC
  Administered 2018-09-06 – 2018-09-07 (×2): 10 mg via ORAL
  Filled 2018-09-06 (×2): qty 1

## 2018-09-06 MED ORDER — PANTOPRAZOLE SODIUM 40 MG PO TBEC
40.0000 mg | DELAYED_RELEASE_TABLET | Freq: Every day | ORAL | Status: DC
  Administered 2018-09-06 – 2018-09-07 (×2): 40 mg via ORAL
  Filled 2018-09-06 (×2): qty 1

## 2018-09-06 MED ORDER — LOSARTAN POTASSIUM 25 MG PO TABS
25.0000 mg | ORAL_TABLET | Freq: Every day | ORAL | Status: DC
  Administered 2018-09-06 – 2018-09-07 (×2): 25 mg via ORAL
  Filled 2018-09-06 (×2): qty 1

## 2018-09-06 MED ORDER — HYDROCHLOROTHIAZIDE 25 MG PO TABS
25.0000 mg | ORAL_TABLET | Freq: Every day | ORAL | Status: DC
  Administered 2018-09-06 – 2018-09-07 (×2): 25 mg via ORAL
  Filled 2018-09-06 (×2): qty 1

## 2018-09-06 MED ORDER — ONDANSETRON HCL 4 MG/2ML IJ SOLN: 4.0000 mg | INTRAMUSCULAR | Status: DC | PRN

## 2018-09-06 MED ORDER — DEXTROSE 50 % IV SOLN
1.0000 | Freq: Once | INTRAVENOUS | Status: AC
  Administered 2018-09-06: 50 mL via INTRAVENOUS
  Filled 2018-09-06: qty 50

## 2018-09-06 NOTE — Progress Notes (Signed)
PROGRESS NOTE    [name]  EQA:[id number] DOB: 02-11-1961 DOA: 09/05/2018 PCP: Patient, No Pcp Per    Brief Narrative:  58 year old female who presented with dyspnea and chest pain.  She does have significant past medical history for chronic diastolic heart failure, hypertension, and dyslipidemia.  Patient reports dyspnea for the last 3 days prior to hospitalization, the day of admission she experienced chest pain, substernal, nonradiating, dull in nature, associated with nausea and vomiting..  She had run out of amlodipine, she was unable to tolerate p.o., including her p.o. losartan.  On the initial physical examination her blood pressure was 219/110, temperature 98.6, heart rate 83, respirate 20, oxygen saturation 99% on room air.  She had a moist mucous membranes, lungs were clear to auscultation bilaterally, heart S1-S2 present, rhythmic, abdomen distended, and tender, positive lower lower extremity edema.  Sodium 142, potassium 3.6, chloride 103, bicarb 28, glucose 57, BUN 19, creatinine 1.25, troponin less than 0.03, lipase 59, AST 18, ALT 14, total bilirubin 0.9, white count 6.9, hemoglobin 8.4, hematocrit 39.6, platelets 264.  Chest x-ray with no infiltrates, abdominal film with air-fluid level.  There was sinus rhythm, first-degree AV block, normal axis, no significant ST segment or T wave changes.   Patient was admitted to the hospital with working diagnosis of uncontrolled hypertension, complicated by chest pain and abdominal pain.  Assessment & Plan:   Principal Problem:   Hypertensive urgency Active Problems:   Chest pain   Hypoglycemia without diagnosis of diabetes mellitus   CKD (chronic kidney disease) stage 3, GFR 30-59 ml/min (HCC)   Abdominal pain   1. Uncontrolled HTN, hypertensive urgency. Patient now able to take po, will resume her home regimen with amlodipine and losartan, will change aldactone for HCTZ, taper nitro drip off, will continue telemetry monitoring,  cardiac enzymes negative. Follow with echocardiography.   2. Abdominal pain, possible ileus. Clinically has improved, no further nausea or vomiting, no abdominal distention, will advance diet as tolerated.  3. Hypoglycemia. Will continue glucose cover and monitoring with insulin sliding scale, will advance diet as tolerated and will continue insulin regimen.   4. CKD stage 3. Will continue to monitor renal function, continue blood pressure control. Will add hctz in exchange of aldactone for better blood pressure control.   5. Diastolic heart failure. Stable with no signs of volume overload, will continue blood pressure control and follow on echocardiogram ordered on admission.    DVT prophylaxis: enoxaparin   Code Status: full Family Communication: no family at the bedside Disposition Plan/ discharge barriers: pending blood pressure control, possible dc in am.   Body mass index is 34.9 kg/m. Malnutrition Type:      Malnutrition Characteristics:      Nutrition Interventions:     RN Pressure Injury Documentation:     Consultants:     Procedures:     Antimicrobials:       Subjective: Patient with less nausea and vomiting, positive severe headache generalized with no radiation, no improving or worsening factors.   Objective: Vitals:   09/06/18 0635 09/06/18 0640 09/06/18 0800 09/06/18 1100  BP: (!) 159/81 (!) 146/94 (!) 143/103 (!) 181/94  Pulse: 69 79 70 85  Resp: 14 12 17    Temp:   98.1 F (36.7 C) 99.3 F (37.4 C)  TempSrc:    Oral  SpO2: 100% 100% 100% 99%  Weight:      Height:        Intake/Output Summary (Last 24 hours) at 09/06/2018  [place] filed at 09/06/2018 0300 Gross per 24 hour  Intake 91.81 ml  Output -  Net 91.81 ml   Filed Weights   09/05/18 2045  Weight: 104.1 kg    Examination:   General: deconditioned and in pain (headache) Neurology: Awake and alert, non focal  E ENT: no pallor, no icterus, oral mucosa  moist Cardiovascular: No JVD. S1-S2 present, rhythmic, no gallops, rubs. Positive systolic murmur at the right sternal border.Trace non pitting lower extremity edema. Pulmonary: positive breath sounds bilaterally, adequate air movement, no wheezing, rhonchi or rales. Gastrointestinal. Abdomen flat, no organomegaly, non tender, no rebound or guarding Skin. No rashes Musculoskeletal: no joint deformities     Data Reviewed: I have personally reviewed following labs and imaging studies  CBC: Recent Labs  Lab 09/05/18 1302  WBC 6.9  HGB 11.4*  HCT 39.6  MCV 94.3  PLT 846   Basic Metabolic Panel: Recent Labs  Lab 09/05/18 1302  NA 142  K 3.6  CL 103  CO2 28  GLUCOSE 57*  BUN 19  CREATININE 1.25*  CALCIUM 9.0   GFR: Estimated Creatinine Clearance: 62.7 mL/min (A) (by C-G formula based on SCr of 1.25 mg/dL (H)). Liver Function Tests: Recent Labs  Lab 09/05/18 2000  AST 18  ALT 14  ALKPHOS 46  BILITOT 0.9  PROT 7.0  ALBUMIN 3.5   Recent Labs  Lab 09/05/18 2000  LIPASE 59*   No results for input(s): AMMONIA in the last 168 hours. Coagulation Profile: No results for input(s): INR, PROTIME in the last 168 hours. Cardiac Enzymes: Recent Labs  Lab 09/05/18 2000 09/06/18 0051 09/06/18 0728  TROPONINI <0.03 <0.03 <0.03   BNP (last 3 results) No results for input(s): PROBNP in the last 8760 hours. HbA1C: No results for input(s): HGBA1C in the last 72 hours. CBG: Recent Labs  Lab 09/06/18 0042 09/06/18 0201 09/06/18 0414 09/06/18 0527 09/06/18 1006  GLUCAP 72 144* 89 97 98   Lipid Profile: No results for input(s): CHOL, HDL, LDLCALC, TRIG, CHOLHDL, LDLDIRECT in the last 72 hours. Thyroid Function Tests: No results for input(s): TSH, T4TOTAL, FREET4, T3FREE, THYROIDAB in the last 72 hours. Anemia Panel: No results for input(s): VITAMINB12, FOLATE, FERRITIN, TIBC, IRON, RETICCTPCT in the last 72 hours.    Radiology Studies: I have reviewed all of  the imaging during this hospital visit personally     Scheduled Meds: . amLODipine  10 mg Oral Daily  . aspirin EC  81 mg Oral Daily  . atorvastatin  40 mg Oral Daily  . enoxaparin (LOVENOX) injection  40 mg Subcutaneous Q24H  . hydrochlorothiazide  25 mg Oral Daily  . losartan  25 mg Oral Daily  . pantoprazole  40 mg Oral Daily   Continuous Infusions:   LOS: 0 days        Mordecai Tindol Gerome Apley, MD [facility] Pager [phone]

## 2018-09-06 NOTE — Care Management Obs Status (Signed)
[facility] NOTIFICATION   Patient Details  Name: Addilynne Olheiser MRN: [id number] Date of Birth: [birth year]   Medicare Observation Status Notification Given:  Yes    Dawayne Patricia, RN 09/06/2018, 3:45 PM

## 2018-09-06 NOTE — Progress Notes (Signed)
Update called to provider. Most recent 3 blood pressures were provided. Provider verbalized a change in SBP goal. Initial goal as follows:  Admin Instructions:  IF INFUSION TITRATION ORDERED: GOAL:Chest pain resolution, SBP 100-110.  INITIATE AT:5 mcg/min TITRATE BY:5 mcg/min to dose range maximum INTERVAL: Every 3 minutes. Use Guardrails Library   New goal is SBP 150-160. Will continue to titrate to reach goal and maintain.   Lucita Lora, RN

## 2018-09-06 NOTE — Progress Notes (Signed)
PROGRESS NOTE  Maili Shutters YSA:[id number] DOB: 02-21-1961 DOA: 09/05/2018 PCP: Patient, No Pcp Per  HPI/Brief Narrative  [name] is a 58 y.o. year old female with medical history significant for chronic diastolic HF, HTN, HLD, CKD stage III who presented on 09/05/2018 with complaints of increased SOB for 3 days and 1 day of intermittent substernal CP and was admitted for HTN urgency. The patient states she was at the grocery store on 09/04/18 and started experiencing nonradiating substernal CP. She describes pain to be sharp and also says it feels "full", with episodes lasting around 20 minutes. She reports experiencing increased SOB over the past couple of days as well as orthopnea and increased swelling in the BLE. She also reports she started experiencing intermittent sharp periumbilical abdominal pain the night before (09/03/18) and experienced multiple episodes of vomiting that night. Patient is prescribed Amlodipine and Losaratan for her HTN. She states she has been taking her Losartan, but had run out of her Amlodipine, so has not been taking that. She denies any fever or chills. She denies any PND or palpitations.  Hospital Course: In the ED, patient was noted to have BP of 219/110. Her CXR showed no evidence of acute cardiopulm dz. Her EKG showed sinus rhythm with 1st degree AV block. Her Troponin has been negative x 3. She was initially started on IV Labetalol, IV Hydralazine, and PO Amlodipine, but continued to be hypertensive. She was then started on Nitro drip with BP goal of 140's-150's. Her BP this morning has been 146/94 and 143/103. She was found to be hypoglycemic in the ED with glucose of 57. Hypoglycemic protocol was initiated with CBG checks q 2 hrs. Since being admitted, her CBG improved with PO intake and has remained between 72-144, with most recent CBG being 97. Her Lipase was mildly elevated at 59 and her LFT's were within normal limits. XR of the abdomen showed mild  small bowel and colonic ileus or partial obstruction and states similar appearance could be seen with gastroenteritis. She was started on clear diet due to her periumbilical abdominal pain.    Assessment/Plan:  1. HTN Urgency- Patient's BP was initially 219/110 in the ED. Her CXR showed no active cardiopulm dz. Her Troponin has been negative x 3. EKG showed sinus rhythm with 1st degree AV block. She was initially on IV Labetalol, IV Hydralazine, and PO Amlodipine for her BP, but remained hypertensive. She was then stared on Nitro drip, with BP goal of 140-150's. Her BP this morning were 146/94 and 143/103. Patient's BP is appearing more stable this morning. Will d/c Nitro drip at this time and continue patient on her home HTN medications of Amlodipine and Losartan. Will continue to closely monitor patient's BP. Repeat EKG ordered today. She does have systolic murmur at right sternal border on exam. Echo performed today demonstrated normal LV size with severe LV hypertrophy and moderate LAE, EF 60-65%, and trivial regurgitation in aortic and mitral valve.   2. Atypical Chest Pain- Patient states she continues to experience intermittent episodes of substernal CP that last around 10-15 minutes. These sometimes occur with her episodes of her periumbilical pain. Last episode was this morning around 8:00 AM. Denies any CP now. Troponin has been negative x 3. EKG did not show any ischemic changes yesterday. Repeat EKG was ordered today. Will continue to monitor BP.   3. Abdominal Pain- Patient has been experiencing intermittent episodes of periumbilical abdominal pain. She had multiple episodes of vomiting with the periumbilical  pain the day before coming into the ED. Her lipase is mildly elevated at 59. Her LFT's are WNL. She had an abdominal XR that showed mild small bowel and colonic ileus or partial obstruction, findings that can also be seen with gastroenteritis. Her abdominal pain is improving. She has an  appetite today and is requesting to eat. Will order heart healthy diet for patient. Will also order Protonix as well as Zofran PRN for nausea. Continue Tylenol PRN for pain.   4. Hypoglycemia-Stable. Patient was noted to be hypoglycemic with glucose of 57 on arrival to the ED. This improved with PO intake. She has been having CBG check q2 hours. Over the past several hours, her CBG has remained stable in the 80's-90's. Will continue to monitor with CBG checks.   4. CKD Stage III- Stable. Patient's Cr stable at 1.25, which is around baseline for her. Her Potassium is stable at 3.6. Will continue to monitor with BMP.   5. Diastolic CHF- Patient still has some trace swelling in BLE without pitting. No JVD on exam. Her SOB that she was experiencing has improved. Patient was on Aldactone 100 mg at home. We will switch the patient to HCTZ 25 mg to be continued at discharge.   6. HLD- Stable. Will continue home Atorvastatin.   Cultures:  none  Telemetry: Patient is on telemetry   DVT prophylaxis: Lovenox   Consultants:  none   Procedures:  Echo performed today   Antimicrobials:  Code Status: FULL   Family Communication: none   Disposition Plan: Anticipate discharge home tomorrow with her BP remaining stable with her home medications.     Subjective Patient states she is feeling improved today. She is still experiencing some intermittent episodes of substernal CP, with last one being around 8:00 this morning. She is currently CP free. She states her periumbilical pain has improved, but she is still experiencing some intermittent episodes that usually happen together with the CP. She has an appetite and is requesting to eat today. She denies any vomiting. She is currently experiencing a HA, most likely related to the nitro drip. She states the SOB she was experiencing has improved.   Objective: Vitals:   09/06/18 0630 09/06/18 0635 09/06/18 0640 09/06/18 0800  BP: (!) 169/89 (!)  159/81 (!) 146/94 (!) 143/103  Pulse: 69 69 79 70  Resp: 15 14 12 17   Temp:    98.1 F (36.7 C)  TempSrc:      SpO2: 99% 100% 100% 100%  Weight:      Height:        Intake/Output Summary (Last 24 hours) at 09/06/2018 5188 Last data filed at 09/06/2018 0300 Gross per 24 hour  Intake 91.81 ml  Output -  Net 91.81 ml   Filed Weights   09/05/18 2045  Weight: 104.1 kg   Exam:  Constitutional:normal appearing female Eyes: EOMI, anicteric, normal conjunctivae ENMT: Oropharynx with moist mucous membranes, normal dentition Neck: FROM Cardiovascular: RRR, systolic murmur heard at right sternal border, some trace edema in the BLE, DP and PT pulses are intact  Respiratory: Normal respiratory effort, clear breath sounds  Abdomen: Soft, diffuse tenderness to palpation, with no HSM Skin: No rash ulcers, or lesions. Without skin tenting  Neurologic: Grossly no focal neuro deficit. Psychiatric:Appropriate affect, and mood. Mental status AAOx3  Data Reviewed: CBC: Recent Labs  Lab 09/05/18 1302  WBC 6.9  HGB 11.4*  HCT 39.6  MCV 94.3  PLT 416   Basic Metabolic Panel:  Recent Labs  Lab 09/05/18 1302  NA 142  K 3.6  CL 103  CO2 28  GLUCOSE 57*  BUN 19  CREATININE 1.25*  CALCIUM 9.0   GFR: Estimated Creatinine Clearance: 62.7 mL/min (A) (by C-G formula based on SCr of 1.25 mg/dL (H)). Liver Function Tests: Recent Labs  Lab 09/05/18 2000  AST 18  ALT 14  ALKPHOS 46  BILITOT 0.9  PROT 7.0  ALBUMIN 3.5   Recent Labs  Lab 09/05/18 2000  LIPASE 59*   No results for input(s): AMMONIA in the last 168 hours. Coagulation Profile: No results for input(s): INR, PROTIME in the last 168 hours. Cardiac Enzymes: Recent Labs  Lab 09/05/18 2000 09/06/18 0051 09/06/18 0728  TROPONINI <0.03 <0.03 <0.03   BNP (last 3 results) No results for input(s): PROBNP in the last 8760 hours. HbA1C: No results for input(s): HGBA1C in the last 72 hours. CBG: Recent Labs  Lab  09/05/18 1813 09/06/18 0042 09/06/18 0201 09/06/18 0414 09/06/18 0527  GLUCAP 100* 72 144* 89 97   Lipid Profile: No results for input(s): CHOL, HDL, LDLCALC, TRIG, CHOLHDL, LDLDIRECT in the last 72 hours. Thyroid Function Tests: No results for input(s): TSH, T4TOTAL, FREET4, T3FREE, THYROIDAB in the last 72 hours. Anemia Panel: No results for input(s): VITAMINB12, FOLATE, FERRITIN, TIBC, IRON, RETICCTPCT in the last 72 hours. Urine analysis:    Component Value Date/Time   COLORURINE YELLOW 05/12/2013 1025   APPEARANCEUR CLEAR 05/12/2013 1025   LABSPEC 1.015 05/12/2013 1025   PHURINE 7.0 05/12/2013 1025   GLUCOSEU NEGATIVE 05/12/2013 1025   HGBUR NEGATIVE 05/12/2013 1025   BILIRUBINUR NEGATIVE 05/12/2013 1025   KETONESUR NEGATIVE 05/12/2013 1025   PROTEINUR NEGATIVE 05/12/2013 1025   UROBILINOGEN 0.2 05/12/2013 1025   NITRITE NEGATIVE 05/12/2013 1025   LEUKOCYTESUR NEGATIVE 05/12/2013 1025   Sepsis Labs: @LABRCNTIP (procalcitonin:4,lacticidven:4)  ) Recent Results (from the past 240 hour(s))  MRSA PCR Screening     Status: None   Collection Time: 09/05/18 11:15 PM  Result Value Ref Range Status   MRSA by PCR NEGATIVE NEGATIVE Final    Comment:        The GeneXpert MRSA Assay (FDA approved for NASAL specimens only), is one component of a comprehensive MRSA colonization surveillance program. It is not intended to diagnose MRSA infection nor to guide or monitor treatment for MRSA infections. Performed at [facility], [place] [address], [place], [place] [address]       Studies: Dg Chest 2 View  Result Date: 09/05/2018 CLINICAL DATA:  Chest pain for 2 days EXAM: CHEST - 2 VIEW COMPARISON:  05/12/2015 FINDINGS: Cardiac shadow is mildly prominent. Aortic calcifications are again seen. The lungs are clear. No bony abnormality is noted. IMPRESSION: No active cardiopulmonary disease. Electronically Signed   By: Inez Catalina M.D.   On: 09/05/2018 14:04   Dg Abd 2  Views  Result Date: 09/05/2018 CLINICAL DATA:  Periumbilical abdominal pain for the past 3 days. EXAM: ABDOMEN - 2 VIEW COMPARISON:  Abdomen CT dated 02/05/2017 and chest, abdomen pelvis radiographs dated 01/04/2013. FINDINGS: Mildly dilated small bowel loops and sigmoid colon in the left mid and lower abdomen with multiple air-fluid levels in the small bowel, colon and stomach. No free peritoneal air. Mild lumbosacral spine degenerative changes. IMPRESSION: Mild small bowel and colonic ileus or partial obstruction. A similar appearance can be seen with gastroenteritis. Electronically Signed   By: Claudie Revering M.D.   On: 09/05/2018 20:40    Scheduled  Meds: . amLODipine  10 mg Oral Daily  . aspirin EC  81 mg Oral Daily  . atorvastatin  40 mg Oral Daily  . enoxaparin (LOVENOX) injection  40 mg Subcutaneous Q24H  . losartan  25 mg Oral Daily    Continuous Infusions:   LOS: 0 days   Romie Minus, PA-S

## 2018-09-06 NOTE — Progress Notes (Signed)
  Echocardiogram 2D Echocardiogram has been performed.  [name] 09/06/2018, 9:50 AM

## 2018-09-06 NOTE — Progress Notes (Signed)
Spoke with Dr. Marlowe Sax. New SBP goal as of 0500 hrs 09/06/2018 is 140s-150s. Will titrate nitro gtt to meet new goal.  Lucita Lora, RN

## 2018-09-07 DIAGNOSIS — E162 Hypoglycemia, unspecified: Secondary | ICD-10-CM | POA: Diagnosis not present

## 2018-09-07 DIAGNOSIS — R109 Unspecified abdominal pain: Secondary | ICD-10-CM | POA: Diagnosis not present

## 2018-09-07 DIAGNOSIS — N183 Chronic kidney disease, stage 3 (moderate): Secondary | ICD-10-CM | POA: Diagnosis not present

## 2018-09-07 DIAGNOSIS — I160 Hypertensive urgency: Secondary | ICD-10-CM | POA: Diagnosis not present

## 2018-09-07 LAB — URINALYSIS, ROUTINE W REFLEX MICROSCOPIC
Bilirubin Urine: NEGATIVE
Glucose, UA: NEGATIVE mg/dL
Hgb urine dipstick: NEGATIVE
Ketones, ur: NEGATIVE mg/dL
Nitrite: NEGATIVE
PH: 6 (ref 5.0–8.0)
Protein, ur: NEGATIVE mg/dL
Specific Gravity, Urine: 1.014 (ref 1.005–1.030)

## 2018-09-07 LAB — BASIC METABOLIC PANEL
Anion gap: 9 (ref 5–15)
BUN: 15 mg/dL (ref 6–20)
CO2: 28 mmol/L (ref 22–32)
Calcium: 9 mg/dL (ref 8.9–10.3)
Chloride: 103 mmol/L (ref 98–111)
Creatinine, Ser: 1.16 mg/dL — ABNORMAL HIGH (ref 0.44–1.00)
GFR, EST NON AFRICAN AMERICAN: 52 mL/min — AB (ref >60)
Glucose, Bld: 92 mg/dL (ref 70–99)
Potassium: 3.4 mmol/L — ABNORMAL LOW (ref 3.5–5.1)
Sodium: 140 mmol/L (ref 135–145)

## 2018-09-07 MED ORDER — AMOXICILLIN 500 MG PO CAPS
500.0000 mg | ORAL_CAPSULE | Freq: Three times a day (TID) | ORAL | Status: DC
  Administered 2018-09-07: 500 mg via ORAL
  Filled 2018-09-07 (×2): qty 1

## 2018-09-07 MED ORDER — LOSARTAN POTASSIUM 50 MG PO TABS: 50.0000 mg | ORAL_TABLET | Freq: Every day | ORAL | Status: DC

## 2018-09-07 MED ORDER — LOSARTAN POTASSIUM 25 MG PO TABS
25.0000 mg | ORAL_TABLET | Freq: Once | ORAL | Status: AC
  Administered 2018-09-07: 25 mg via ORAL
  Filled 2018-09-07: qty 1

## 2018-09-07 MED ORDER — LABETALOL HCL 5 MG/ML IV SOLN
20.0000 mg | Freq: Once | INTRAVENOUS | Status: AC
  Administered 2018-09-07: 20 mg via INTRAVENOUS
  Filled 2018-09-07: qty 4

## 2018-09-07 MED ORDER — HYDRALAZINE HCL 20 MG/ML IJ SOLN: 10.0000 mg | Freq: Four times a day (QID) | INTRAMUSCULAR | Status: DC | PRN

## 2018-09-07 MED ORDER — AMOXICILLIN 500 MG PO CAPS: 500.0000 mg | ORAL_CAPSULE | Freq: Three times a day (TID) | ORAL | 0 refills | Status: AC

## 2018-09-07 MED ORDER — HYDROCHLOROTHIAZIDE 25 MG PO TABS: 25.0000 mg | ORAL_TABLET | Freq: Every day | ORAL | 0 refills | Status: DC

## 2018-09-07 MED ORDER — LOSARTAN POTASSIUM 50 MG PO TABS: 50.0000 mg | ORAL_TABLET | Freq: Every day | ORAL | 0 refills | Status: DC

## 2018-09-07 NOTE — Progress Notes (Addendum)
Patient given discharge instructions, medication list and prescriptions sent to personal pharmacy. Follow up appointment directions reviewed with patient and pt verbalized understanding.I V and tele dcd will discharge home as ordered. Michalla Ringer, Bettina Gavia RN

## 2018-09-07 NOTE — Progress Notes (Signed)
BP 202/94 paged Arby Barrette to advise of increasing pressures- orders received

## 2018-09-07 NOTE — Discharge Summary (Addendum)
Physician Discharge Summary  [name] IPJ:[id number] DOB: [birth year] DOA: 09/05/2018  PCP: Patient, No Pcp Per  Admit date: 09/05/2018 Discharge date: 09/07/2018  Admitted From: Home  Disposition:  Home   Recommendations for Outpatient Follow-up and new medication changes:  1. Follow up with PCP in 1-2 weeks 2. Patient has been placed on HCTZ and Losartan has been increased to 50 mg daily. 3. Continue amlodipine and spironolactone has been discontinued.  4. Follow on renal panel next week.  5. Positive UTI, will prescribe 3 days of amoxicillin.   Home Health: No Equipment/Devices: no  Discharge Condition: stable  CODE STATUS: full Diet recommendation: heart healthy   Brief/Interim Summary: 58 year old female who presented with dyspnea and chest pain.  She does have significant past medical history for chronic diastolic heart failure, hypertension, and dyslipidemia.  Patient reports dyspnea for the last 3 days prior to hospitalization, the day of admission she experienced chest pain, substernal, nonradiating, dull in nature, associated with nausea and vomiting. She had run out of amlodipine, she was unable to tolerate p.o., including her p.o. losartan.  On the initial physical examination her blood pressure was 219/110, temperature 98.6, heart rate 83, respiratory rate was 20, oxygen saturation 99% on room air.  She had a moist mucous membranes, lungs were clear to auscultation bilaterally, heart S1-S2 present, rhythmic, abdomen distended, and tender, positive lower lower extremity edema.  Sodium 142, potassium 3.6, chloride 103, bicarb 28, glucose 57, BUN 19, creatinine 1.25, troponin less than 0.03, lipase 59, AST 18, ALT 14, total bilirubin 0.9, white count 6.9, hemoglobin 8.4, hematocrit 39.6, platelets 264.  Chest x-ray with no infiltrates, abdominal film with air-fluid levels. EKG was sinus rhythm, first-degree AV block, normal axis, no significant ST segment or T wave changes.    Patient was admitted to the hospital with working diagnosis of uncontrolled hypertension, complicated by chest pain and abdominal pain.  1.  Uncontrolled hypertension/hypertensive urgency.  Patient was admitted to the medical ward, she was placed on a telemetry monitor, her antihypertensive agents were adjusted with increased dose of losartan to 50 mg daily, added hydrochlorothiazide and continue amlodipine 10 g daily.  Blood pressure has improved with systolic 734 to 193 mmHg.  Patient was advised about compliance with her antihypertensives along with a salt restricted diet.  To follow-up in the outpatient clinic within 7 days.  2.  Abdominal pain, rule out ileus.  Patient had significant GI symptoms on admission, slowly self resolved, her diet was advanced with good toleration, at discharge no nausea, and no vomiting.  3.  Chronic kidney disease stage III.  Patient's kidney function remained stable, her discharge creatinine was 1.16, with a potassium of 3.4 and serum bicarbonate of 28.  Losartan dose has been increased to 50 mg daily will need follow-up kidney function in 7 days.  Will hold on spironolactone and hydrochlorothiazide was added.  4.  Hypoglycemia.  Patient was started hypoglycemic on admission, her diet was advanced with stabilization of serum glucose.  Glucose was 92.  5.  Diastolic heart failure with hypertensive cardiomyopathy.  Chronic and stable, with no signs of acute exacerbation.  Further work-up with echocardiography, showed preserved LV systolic function 60 to 79%, no significant valvular normalities.  Increased wall thickness consistent with severe left ventricular hypertrophy.  6. Dyslipidemia. Continue statin therapy.   7. Obesity. Calculated BMI 34,9, will need outpatient follow up and lifestyle modifications.    Discharge Diagnoses:  Principal Problem:   Hypertensive urgency Active Problems:  Chest pain   Hypoglycemia without diagnosis of diabetes mellitus    CKD (chronic kidney disease) stage 3, GFR 30-59 ml/min (HCC)   Abdominal pain    Discharge Instructions   Allergies as of 09/07/2018      Reactions   Lisinopril Cough      Medication List    STOP taking these medications   CALCIUM 1200 PO   ibuprofen 200 MG tablet Commonly known as:  ADVIL,MOTRIN   spironolactone 100 MG tablet Commonly known as:  ALDACTONE     TAKE these medications   amLODipine 10 MG tablet Commonly known as:  NORVASC Take 1 tablet (10 mg total) by mouth daily.   amoxicillin 500 MG capsule Commonly known as:  AMOXIL Take 1 capsule (500 mg total) by mouth every 8 (eight) hours for 3 days.   aspirin 81 MG EC tablet Take 1 tablet (81 mg total) by mouth daily.   atorvastatin 40 MG tablet Commonly known as:  LIPITOR Take 1 tablet (40 mg total) by mouth daily.   hydrochlorothiazide 25 MG tablet Commonly known as:  HYDRODIURIL Take 1 tablet (25 mg total) by mouth daily for 30 days. Start taking on:  September 08, 2018   losartan 50 MG tablet Commonly known as:  COZAAR Take 1 tablet (50 mg total) by mouth daily for 30 days. Start taking on:  September 08, 2018 What changed:    medication strength  See the new instructions.       Allergies  Allergen Reactions  . Lisinopril Cough    Consultations:     Procedures/Studies: Dg Chest 2 View  Result Date: 09/05/2018 CLINICAL DATA:  Chest pain for 2 days EXAM: CHEST - 2 VIEW COMPARISON:  05/12/2015 FINDINGS: Cardiac shadow is mildly prominent. Aortic calcifications are again seen. The lungs are clear. No bony abnormality is noted. IMPRESSION: No active cardiopulmonary disease. Electronically Signed   By: Inez Catalina M.D.   On: 09/05/2018 14:04   Dg Abd 2 Views  Result Date: 09/05/2018 CLINICAL DATA:  Periumbilical abdominal pain for the past 3 days. EXAM: ABDOMEN - 2 VIEW COMPARISON:  Abdomen CT dated 02/05/2017 and chest, abdomen pelvis radiographs dated 01/04/2013. FINDINGS: Mildly dilated  small bowel loops and sigmoid colon in the left mid and lower abdomen with multiple air-fluid levels in the small bowel, colon and stomach. No free peritoneal air. Mild lumbosacral spine degenerative changes. IMPRESSION: Mild small bowel and colonic ileus or partial obstruction. A similar appearance can be seen with gastroenteritis. Electronically Signed   By: Claudie Revering M.D.   On: 09/05/2018 20:40       Subjective: Patient is feeling better, abdominal pain has improved, no nausea or vomiting. No chest pain or dyspnea. Blood pressure has improved.   Discharge Exam: Vitals:   09/07/18 0444 09/07/18 0822  BP: (!) 188/84 (!) 188/89  Pulse: 65 72  Resp: 18 16  Temp: 98 F (36.7 C)   SpO2: 95% 100%   Vitals:   09/07/18 0015 09/07/18 0123 09/07/18 0444 09/07/18 0822  BP: (!) 202/94 (!) 199/126 (!) 188/84 (!) 188/89  Pulse: 65 78 65 72  Resp: (!) 21 19 18 16   Temp:   98 F (36.7 C)   TempSrc:   Oral   SpO2: 100% 100% 95% 100%  Weight:      Height:        General: Not in pain or dyspnea  Neurology: Awake and alert, non focal  E ENT: no pallor, no icterus,  oral mucosa moist Cardiovascular: No JVD. S1-S2 present, rhythmic, no gallops, rubs, or murmurs. No lower extremity edema. Pulmonary: vesicular breath sounds bilaterally, adequate air movement, no wheezing, rhonchi or rales. Gastrointestinal. Abdomen with, no organomegaly, non tender, no rebound or guarding Skin. No rashes Musculoskeletal: no joint deformities   The results of significant diagnostics from this hospitalization (including imaging, microbiology, ancillary and laboratory) are listed below for reference.     Microbiology: Recent Results (from the past 240 hour(s))  MRSA PCR Screening     Status: None   Collection Time: 09/05/18 11:15 PM  Result Value Ref Range Status   MRSA by PCR NEGATIVE NEGATIVE Final    Comment:        The GeneXpert MRSA Assay (FDA approved for NASAL specimens only), is one component  of a comprehensive MRSA colonization surveillance program. It is not intended to diagnose MRSA infection nor to guide or monitor treatment for MRSA infections. Performed at [facility], [place] [address], [place], [place] [address]      Labs: BNP (last 3 results) Recent Labs    09/05/18 1310  BNP 951.8*   Basic Metabolic Panel: Recent Labs  Lab 09/05/18 1302 09/07/18 0358  NA 142 140  K 3.6 3.4*  CL 103 103  CO2 28 28  GLUCOSE 57* 92  BUN 19 15  CREATININE 1.25* 1.16*  CALCIUM 9.0 9.0   Liver Function Tests: Recent Labs  Lab 09/05/18 2000  AST 18  ALT 14  ALKPHOS 46  BILITOT 0.9  PROT 7.0  ALBUMIN 3.5   Recent Labs  Lab 09/05/18 2000  LIPASE 59*   No results for input(s): AMMONIA in the last 168 hours. CBC: Recent Labs  Lab 09/05/18 1302  WBC 6.9  HGB 11.4*  HCT 39.6  MCV 94.3  PLT 264   Cardiac Enzymes: Recent Labs  Lab 09/05/18 2000 09/06/18 0051 09/06/18 0728  TROPONINI <0.03 <0.03 <0.03   BNP: Invalid input(s): POCBNP CBG: Recent Labs  Lab 09/06/18 0042 09/06/18 0201 09/06/18 0414 09/06/18 0527 09/06/18 1006  GLUCAP 72 144* 89 97 98   D-Dimer No results for input(s): DDIMER in the last 72 hours. Hgb A1c No results for input(s): HGBA1C in the last 72 hours. Lipid Profile No results for input(s): CHOL, HDL, LDLCALC, TRIG, CHOLHDL, LDLDIRECT in the last 72 hours. Thyroid function studies No results for input(s): TSH, T4TOTAL, T3FREE, THYROIDAB in the last 72 hours.  Invalid input(s): FREET3 Anemia work up No results for input(s): VITAMINB12, FOLATE, FERRITIN, TIBC, IRON, RETICCTPCT in the last 72 hours. Urinalysis    Component Value Date/Time   COLORURINE YELLOW 05/12/2013 1025   APPEARANCEUR CLEAR 05/12/2013 1025   LABSPEC 1.015 05/12/2013 1025   PHURINE 7.0 05/12/2013 1025   GLUCOSEU NEGATIVE 05/12/2013 1025   HGBUR NEGATIVE 05/12/2013 1025   BILIRUBINUR NEGATIVE 05/12/2013 1025   KETONESUR NEGATIVE 05/12/2013  1025   PROTEINUR NEGATIVE 05/12/2013 1025   UROBILINOGEN 0.2 05/12/2013 1025   NITRITE NEGATIVE 05/12/2013 1025   LEUKOCYTESUR NEGATIVE 05/12/2013 1025   Sepsis Labs Invalid input(s): PROCALCITONIN,  WBC,  LACTICIDVEN Microbiology Recent Results (from the past 240 hour(s))  MRSA PCR Screening     Status: None   Collection Time: 09/05/18 11:15 PM  Result Value Ref Range Status   MRSA by PCR NEGATIVE NEGATIVE Final    Comment:        The GeneXpert MRSA Assay (FDA approved for NASAL specimens only), is one component of a comprehensive MRSA colonization  surveillance program. It is not intended to diagnose MRSA infection nor to guide or monitor treatment for MRSA infections. Performed at [facility], [place] [address], [place], [place] [address]      Time coordinating discharge: 45 minutes  SIGNED:   Tawni Millers, MD  [facility] 09/07/2018, 10:28 AM Pager [phone]  If 7PM-7AM, please contact night-coverage [email or web address] Password [email or web address]

## 2019-02-27 ENCOUNTER — Ambulatory Visit (INDEPENDENT_AMBULATORY_CARE_PROVIDER_SITE_OTHER): Payer: Medicare Other | Admitting: Nurse Practitioner

## 2019-02-27 ENCOUNTER — Other Ambulatory Visit: Payer: Self-pay

## 2019-02-27 ENCOUNTER — Encounter: Payer: Self-pay | Admitting: Nurse Practitioner

## 2019-02-27 VITALS — BP 200/100 | HR 88 | Temp 98.9°F | Ht 67.4 in | Wt 242.8 lb

## 2019-02-27 DIAGNOSIS — I10 Essential (primary) hypertension: Secondary | ICD-10-CM

## 2019-02-27 DIAGNOSIS — Z6837 Body mass index (BMI) 37.0-37.9, adult: Secondary | ICD-10-CM

## 2019-02-27 DIAGNOSIS — E66812 Obesity, class 2: Secondary | ICD-10-CM

## 2019-02-27 DIAGNOSIS — Z833 Family history of diabetes mellitus: Secondary | ICD-10-CM | POA: Diagnosis not present

## 2019-02-27 DIAGNOSIS — Z13228 Encounter for screening for other metabolic disorders: Secondary | ICD-10-CM

## 2019-02-27 DIAGNOSIS — F439 Reaction to severe stress, unspecified: Secondary | ICD-10-CM | POA: Diagnosis not present

## 2019-02-27 DIAGNOSIS — E785 Hyperlipidemia, unspecified: Secondary | ICD-10-CM | POA: Diagnosis not present

## 2019-02-27 MED ORDER — MAGNESIUM 250 MG PO TABS: ORAL_TABLET | ORAL | 1 refills | Status: DC

## 2019-02-27 MED ORDER — LOSARTAN POTASSIUM 50 MG PO TABS: 50.0000 mg | ORAL_TABLET | Freq: Every day | ORAL | 1 refills | Status: DC

## 2019-02-27 MED ORDER — HYDROCHLOROTHIAZIDE 25 MG PO TABS: 25.0000 mg | ORAL_TABLET | Freq: Every day | ORAL | 1 refills | Status: DC

## 2019-02-27 MED ORDER — ATORVASTATIN CALCIUM 40 MG PO TABS: 40.0000 mg | ORAL_TABLET | Freq: Every day | ORAL | 1 refills | Status: DC

## 2019-02-27 MED ORDER — AMLODIPINE BESYLATE 10 MG PO TABS: 10.0000 mg | ORAL_TABLET | Freq: Every day | ORAL | 3 refills | Status: DC

## 2019-02-27 NOTE — Progress Notes (Addendum)
Subjective:     Patient ID: [name] , female    DOB: [birth year] , 58 y.o.   MRN: [id number]   Chief Complaint  Patient presents with  . Establish Care  . Hypertension    patient has not had any medicine in the past month     HPI  Here to establish care - was going to [facility],  She had not seen the PCP in moree than 6 months.  She works 20 hours per week, she is a Training and development officer at [employer].  She has 5 children - live here locally.  She is single.    PMH - HTN for 9-10 years.  She has venous stasis ulcers for PVD - she has gone to the wound at [facility].  About 2 years ago.  She has been seen at vein and vascular. Congestive Heart Failure.   Mother diabetic and pancreatic cancer.  Father - deceased from Trauma.  Brother - paraplegic he is currently in the hospital.  DM.  Sister here and 1 sister in [place].  Oldest sister passed from DM, another sister deceased from kidney failure.   Hypertension This is a chronic problem. The current episode started more than 1 year ago. The problem has been gradually worsening since onset. The problem is uncontrolled. Pertinent negatives include no anxiety, chest pain, headaches or palpitations. There are no associated agents to hypertension. Past treatments include calcium channel blockers, angiotensin blockers and diuretics. The current treatment provides no improvement. There are no compliance problems.      Past Medical History:  Diagnosis Date  . Anemia   . CHF (congestive heart failure) ([place])   . Chronic bronchitis ([place])    "probably once/yr" (05/12/2013)  . Diastolic heart failure   . Dyslipidemia    pt denies this hx on 05/12/2013  . Exertional shortness of breath   . GERD (gastroesophageal reflux disease)   . Hypertension   . Hypokalemia   . Venous stasis ulcers (HCC)      Family History  Problem Relation Age of Onset  . Hypertension Mother   . Hypertension Sister   . Diabetes Sister   .  Hypertension Brother   . Diabetes Sister      Current Outpatient Medications:  .  acetaminophen (TYLENOL) 325 MG tablet, Take 650 mg by mouth every 6 (six) hours as needed., Disp: , Rfl:  .  aspirin EC 81 MG EC tablet, Take 1 tablet (81 mg total) by mouth daily., Disp: 30 tablet, Rfl: 3 .  amLODipine (NORVASC) 10 MG tablet, Take 1 tablet (10 mg total) by mouth daily. (Patient not taking: Reported on 02/27/2019), Disp: 90 tablet, Rfl: 3 .  atorvastatin (LIPITOR) 40 MG tablet, Take 1 tablet (40 mg total) by mouth daily. (Patient not taking: Reported on 02/27/2019), Disp: 90 tablet, Rfl: 3 .  hydrochlorothiazide (HYDRODIURIL) 25 MG tablet, Take 1 tablet (25 mg total) by mouth daily for 30 days., Disp: 30 tablet, Rfl: 0 .  losartan (COZAAR) 50 MG tablet, Take 1 tablet (50 mg total) by mouth daily for 30 days., Disp: 30 tablet, Rfl: 0   Allergies  Allergen Reactions  . Lisinopril Cough     Review of Systems  Constitutional: Negative.  Negative for fatigue.  Respiratory: Negative for apnea and cough.   Cardiovascular: Negative for chest pain, palpitations and leg swelling.  Endocrine: Negative for polydipsia, polyphagia and polyuria.  Neurological: Negative for dizziness and headaches.  Psychiatric/Behavioral: Negative.  Today's Vitals   02/27/19 1029  BP: (!) 200/100  Pulse: 88  Temp: 98.9 F (37.2 C)  TempSrc: Oral  SpO2: 97%  Weight: 242 lb 12.8 oz (110.1 kg)  Height: 5' 7.4" (1.712 m)  PainSc: 8   PainLoc: Leg   Body mass index is 37.58 kg/m.   Objective:  Physical Exam Constitutional:      Appearance: Normal appearance. She is obese.  Cardiovascular:     Rate and Rhythm: Normal rate and regular rhythm.     Pulses: Normal pulses.     Heart sounds: Normal heart sounds. No murmur.  Pulmonary:     Effort: Pulmonary effort is normal.     Breath sounds: Normal breath sounds.  Skin:    General: Skin is warm and dry.     Capillary Refill: Capillary refill takes less  than 2 seconds.  Neurological:     General: No focal deficit present.     Mental Status: She is alert and oriented to person, place, and time.  Psychiatric:        Mood and Affect: Mood normal.        Behavior: Behavior normal.        Thought Content: Thought content normal.        Judgment: Judgment normal.         Assessment And Plan:     1. Severe uncontrolled hypertension  She is new to the practice and reports she has been without her blood pressure medications for one month  Poorly controlled blood pressure and is elevated today  She tells me her brother is currently in the hospital   I will restart her blood pressure medications and she is to return in one week for nurse visit blood pressure check  I have stressed the importance of elevated blood pressure and risk for stroke or heart attack, later leading to chronic kidney failure  EKG done with Sinus rhythm left atrial enlargement  Heart rate 86 - EKG 12-Lead - losartan (COZAAR) 50 MG tablet; Take 1 tablet (50 mg total) by mouth daily.  Dispense: 90 tablet; Refill: 1 - hydrochlorothiazide (HYDRODIURIL) 25 MG tablet; Take 1 tablet (25 mg total) by mouth daily.  Dispense: 90 tablet; Refill: 1 - amLODipine (NORVASC) 10 MG tablet; Take 1 tablet (10 mg total) by mouth daily.  Dispense: 90 tablet; Refill: 3 - CMP14 + Anion Gap - TSH - Magnesium 250 MG TABS; Take 1 tablet by mouth daily with evening meal  Dispense: 90 tablet; Refill: 1  2.  Hyperlipemia  Chronic, controlled  Continue with current medications  Avoid fried and fatty foods - atorvastatin (LIPITOR) 40 MG tablet; Take 1 tablet (40 mg total) by mouth daily.  Dispense: 90 tablet; Refill: 1  3. Stress  Related to her brother being ill this can also have a contributing factor to her elevated blood pressure  4. Family history of diabetes mellitus (DM)   5. Encounter for screening for metabolic disorder   6. Class 2 severe obesity due to excess calories  with serious comorbidity and body mass index (BMI) of 37.0 to 37.9 in adult [facility])  Discussed healthy diet and regular exercise options   Encouraged to exercise at least 150 minutes per week with 2 days of strength training  Discussed with her with weight loss this can help improve her blood pressure [name], FNP    THE PATIENT IS ENCOURAGED TO PRACTICE SOCIAL DISTANCING DUE TO THE COVID-19 PANDEMIC.

## 2019-02-28 LAB — CMP14 + ANION GAP
ALT: 14 IU/L (ref 0–32)
AST: 17 IU/L (ref 0–40)
Albumin/Globulin Ratio: 1.5 (ref 1.2–2.2)
Albumin: 4.3 g/dL (ref 3.8–4.9)
Alkaline Phosphatase: 73 IU/L (ref 39–117)
Anion Gap: 18 mmol/L (ref 10.0–18.0)
BUN/Creatinine Ratio: 18 (ref 9–23)
BUN: 24 mg/dL (ref 6–24)
Bilirubin Total: 0.5 mg/dL (ref 0.0–1.2)
CO2: 25 mmol/L (ref 20–29)
Calcium: 9.3 mg/dL (ref 8.7–10.2)
Chloride: 99 mmol/L (ref 96–106)
Creatinine, Ser: 1.34 mg/dL — ABNORMAL HIGH (ref 0.57–1.00)
GFR calc Af Amer: 50 mL/min/{1.73_m2} — ABNORMAL LOW (ref >59)
GFR calc non Af Amer: 44 mL/min/{1.73_m2} — ABNORMAL LOW (ref >59)
Globulin, Total: 2.9 g/dL (ref 1.5–4.5)
Glucose: 103 mg/dL — ABNORMAL HIGH (ref 65–99)
Potassium: 3.4 mmol/L — ABNORMAL LOW (ref 3.5–5.2)
Sodium: 142 mmol/L (ref 134–144)
Total Protein: 7.2 g/dL (ref 6.0–8.5)

## 2019-02-28 LAB — TSH: TSH: 0.665 u[IU]/mL (ref 0.450–4.500)

## 2019-03-04 ENCOUNTER — Telehealth: Payer: Self-pay

## 2019-03-04 NOTE — Telephone Encounter (Signed)
I called pt to notify her of her labs and then she requested that we move her nurse visit for [facility] because she id with her brother I asked pt if she was out of town she stated no she is still in town. I advised pt that she really needs to come in for her nurse visit because her blood pressure was really high when she came in last week. I also offered pt an appt sooner and she declined it and said she couldn't come in so she will keep her appointment as is. Lonia Mad

## 2019-03-06 ENCOUNTER — Ambulatory Visit: Payer: Self-pay

## 2019-03-06 ENCOUNTER — Ambulatory Visit: Payer: Medicare Other | Admitting: Nurse Practitioner

## 2019-06-05 ENCOUNTER — Ambulatory Visit: Payer: Medicare Other | Admitting: Nurse Practitioner

## 2019-10-17 ENCOUNTER — Other Ambulatory Visit: Payer: Self-pay | Admitting: Nurse Practitioner

## 2019-10-17 DIAGNOSIS — I10 Essential (primary) hypertension: Secondary | ICD-10-CM

## 2019-10-20 ENCOUNTER — Telehealth: Payer: Self-pay

## 2019-10-20 NOTE — Telephone Encounter (Signed)
Left vm for pt to call back to schedule appt

## 2019-11-13 ENCOUNTER — Other Ambulatory Visit: Payer: Self-pay | Admitting: Nurse Practitioner

## 2019-11-13 DIAGNOSIS — I10 Essential (primary) hypertension: Secondary | ICD-10-CM

## 2019-11-24 ENCOUNTER — Encounter: Payer: Medicare Other | Admitting: Nurse Practitioner

## 2020-05-17 ENCOUNTER — Other Ambulatory Visit: Payer: Self-pay | Admitting: Nurse Practitioner

## 2020-05-17 DIAGNOSIS — I10 Essential (primary) hypertension: Secondary | ICD-10-CM

## 2020-06-08 ENCOUNTER — Telehealth: Payer: Self-pay

## 2020-06-08 NOTE — Telephone Encounter (Signed)
I left a message asking the pt to call and schedule AWV with Pamala Hurry and follow up visit with Janece.

## 2020-10-11 ENCOUNTER — Ambulatory Visit (INDEPENDENT_AMBULATORY_CARE_PROVIDER_SITE_OTHER): Payer: Medicare Other | Admitting: Nurse Practitioner

## 2020-10-11 ENCOUNTER — Other Ambulatory Visit: Payer: Self-pay

## 2020-10-11 ENCOUNTER — Encounter: Payer: Self-pay | Admitting: Nurse Practitioner

## 2020-10-11 VITALS — BP 190/100 | HR 87 | Temp 98.8°F | Ht 67.6 in | Wt 198.4 lb

## 2020-10-11 DIAGNOSIS — I5032 Chronic diastolic (congestive) heart failure: Secondary | ICD-10-CM | POA: Insufficient documentation

## 2020-10-11 DIAGNOSIS — I872 Venous insufficiency (chronic) (peripheral): Secondary | ICD-10-CM | POA: Diagnosis not present

## 2020-10-11 DIAGNOSIS — I130 Hypertensive heart and chronic kidney disease with heart failure and stage 1 through stage 4 chronic kidney disease, or unspecified chronic kidney disease: Secondary | ICD-10-CM

## 2020-10-11 DIAGNOSIS — I10 Essential (primary) hypertension: Secondary | ICD-10-CM

## 2020-10-11 DIAGNOSIS — I509 Heart failure, unspecified: Secondary | ICD-10-CM | POA: Diagnosis not present

## 2020-10-11 DIAGNOSIS — Z716 Tobacco abuse counseling: Secondary | ICD-10-CM

## 2020-10-11 DIAGNOSIS — Z720 Tobacco use: Secondary | ICD-10-CM

## 2020-10-11 DIAGNOSIS — E782 Mixed hyperlipidemia: Secondary | ICD-10-CM

## 2020-10-11 DIAGNOSIS — L97821 Non-pressure chronic ulcer of other part of left lower leg limited to breakdown of skin: Secondary | ICD-10-CM

## 2020-10-11 DIAGNOSIS — I208 Other forms of angina pectoris: Secondary | ICD-10-CM

## 2020-10-11 DIAGNOSIS — N1832 Chronic kidney disease, stage 3b: Secondary | ICD-10-CM

## 2020-10-11 DIAGNOSIS — L03116 Cellulitis of left lower limb: Secondary | ICD-10-CM

## 2020-10-11 MED ORDER — AMLODIPINE BESYLATE 10 MG PO TABS: 10.0000 mg | ORAL_TABLET | Freq: Every day | ORAL | 3 refills | Status: DC

## 2020-10-11 MED ORDER — CEPHALEXIN 250 MG PO CAPS: 250.0000 mg | ORAL_CAPSULE | Freq: Four times a day (QID) | ORAL | 0 refills | Status: AC

## 2020-10-11 MED ORDER — ATORVASTATIN CALCIUM 40 MG PO TABS: 40.0000 mg | ORAL_TABLET | Freq: Every day | ORAL | 1 refills | Status: DC

## 2020-10-11 MED ORDER — HYDROCHLOROTHIAZIDE 25 MG PO TABS: 25.0000 mg | ORAL_TABLET | Freq: Every day | ORAL | 1 refills | Status: DC

## 2020-10-11 MED ORDER — LOSARTAN POTASSIUM 50 MG PO TABS: 50.0000 mg | ORAL_TABLET | Freq: Every day | ORAL | 1 refills | Status: DC

## 2020-10-11 NOTE — Patient Instructions (Addendum)

## 2020-10-11 NOTE — Progress Notes (Signed)
I,Yamilka Roman [employer] as a Education administrator for [employer], FNP.,have documented all relevant documentation on the behalf of [name], FNP,as directed by  [name], FNP while in the presence of [name], [place]. This visit occurred during the SARS-CoV-2 public health emergency.  Safety protocols were in place, including screening questions prior to the visit, additional usage of staff PPE, and extensive cleaning of exam room while observing appropriate contact time as indicated for disinfecting solutions.  Subjective:     Patient ID: [name] , female    DOB: [birth year] , 60 y.o.   MRN: [id number]   Chief Complaint  Patient presents with  . Hypertension    HPI  Patient presents today for a blood pressure f/u. She stated she has been out of her meds for a couple weeks.  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  She feels when she was taking her medications more regularly her blood pressure dropped to 120/70's, she cut back on eating seasoning salt and increased pork   Hypertension This is a chronic problem. The current episode started more than 1 year ago. The problem has been gradually worsening since onset. The problem is uncontrolled. Pertinent negatives include no anxiety, blurred vision, chest pain, headaches, palpitations or peripheral edema. Risk factors for coronary artery disease include obesity and sedentary lifestyle. Past treatments include angiotensin blockers, diuretics and calcium channel blockers. There are no compliance problems.  There is no history of angina or kidney disease. There is no history of chronic renal disease or sleep apnea.     Past Medical History:  Diagnosis Date  . Anemia   . CHF (congestive heart failure) ([place])   . Chronic bronchitis ([place])    "probably once/yr" (05/12/2013)  . Diastolic heart failure   . Dyslipidemia    pt denies this hx on 05/12/2013  . Exertional shortness of breath   . GERD  (gastroesophageal reflux disease)   . Hypertension   . Hypokalemia   . Venous stasis ulcers (HCC)      Family History  Problem Relation Age of Onset  . Hypertension Mother   . Hypertension Sister   . Diabetes Sister   . Hypertension Brother   . Diabetes Sister      Current Outpatient Medications:  .  acetaminophen (TYLENOL) 325 MG tablet, Take 650 mg by mouth every 6 (six) hours as needed., Disp: , Rfl:  .  aspirin EC 81 MG EC tablet, Take 1 tablet (81 mg total) by mouth daily., Disp: 30 tablet, Rfl: 3 .  cephALEXin (KEFLEX) 250 MG capsule, Take 1 capsule (250 mg total) by mouth 4 (four) times daily for 10 days., Disp: 40 capsule, Rfl: 0 .  Magnesium 250 MG TABS, Take 1 tablet by mouth daily with evening meal, Disp: 90 tablet, Rfl: 1 .  amLODipine (NORVASC) 10 MG tablet, Take 1 tablet (10 mg total) by mouth daily., Disp: 90 tablet, Rfl: 3 .  atorvastatin (LIPITOR) 40 MG tablet, Take 1 tablet (40 mg total) by mouth daily., Disp: 90 tablet, Rfl: 1 .  hydrochlorothiazide (HYDRODIURIL) 25 MG tablet, Take 1 tablet (25 mg total) by mouth daily., Disp: 90 tablet, Rfl: 1 .  losartan (COZAAR) 50 MG tablet, Take 1 tablet (50 mg total) by mouth daily., Disp: 90 tablet, Rfl: 1   Allergies  Allergen Reactions  . Lisinopril Cough     Review of Systems  Constitutional: Negative.   HENT: Negative.  Eyes: Negative.  Negative for blurred vision.  Respiratory: Negative.   Cardiovascular: Negative.  Negative for chest pain, palpitations and leg swelling.  Gastrointestinal: Negative.   Endocrine: Negative.   Genitourinary: Negative.   Musculoskeletal: Negative.   Skin: Negative.   Neurological: Negative.  Negative for headaches.  Hematological: Negative.   Psychiatric/Behavioral: Negative.      Today's Vitals   10/11/20 1415  BP: (!) 190/100  Pulse: 87  Temp: 98.8 F (37.1 C)  TempSrc: Oral  SpO2: 98%  Weight: 198 lb 6.4 oz (90 kg)  Height: 5' 7.6" (1.717 m)  PainSc: 8    PainLoc: Leg   Body mass index is 30.52 kg/m.   Objective:  Physical Exam Constitutional:      General: She is not in acute distress.    Appearance: Normal appearance. She is obese.  Cardiovascular:     Rate and Rhythm: Normal rate and regular rhythm.     Pulses: Normal pulses.     Heart sounds: Normal heart sounds.  Pulmonary:     Effort: Pulmonary effort is normal. No respiratory distress.     Breath sounds: Normal breath sounds. No wheezing.  Abdominal:     General: Abdomen is flat. Bowel sounds are normal.     Palpations: Abdomen is soft.  Musculoskeletal:        General: Normal range of motion.     Cervical back: Normal range of motion.  Skin:    Capillary Refill: Capillary refill takes less than 2 seconds.  Neurological:     General: No focal deficit present.     Mental Status: She is alert and oriented to person, place, and time.     Cranial Nerves: No cranial nerve deficit.     Motor: No weakness.  Psychiatric:        Mood and Affect: Mood normal.        Behavior: Behavior normal.        Thought Content: Thought content normal.        Judgment: Judgment normal.         Assessment And Plan:     1. Severe uncontrolled hypertension  Chronic  She is poorly controlled with her hypertension  I am referring her to the hypertension clinic  I have restarted her medications that she has been without for several months  I have stressed to her the importance of good blood pressure control which can cause her kidney damage and increased risk for cardiac events. - Ambulatory referral to [facility] [place] - amLODipine (NORVASC) 10 MG tablet; Take 1 tablet (10 mg total) by mouth daily.  Dispense: 90 tablet; Refill: 3 - hydrochlorothiazide (HYDRODIURIL) 25 MG tablet; Take 1 tablet (25 mg total) by mouth daily.  Dispense: 90 tablet; Refill: 1 - losartan (COZAAR) 50 MG tablet; Take 1 tablet (50 mg total) by mouth daily.  Dispense: 90  tablet; Refill: 1  2. Diastolic dysfunction without heart failure  Chronic, she is advised to take her medication as directed.   3. Tobacco abuse  She has an 18 pack year history of smoking, will order a CT chest without   4. Atypical angina (HCC)  Chronic, no recent episodes of chest pain  5. Hyperlipemia  Chronic, controlled  Continue with current medications, tolerating well - atorvastatin (LIPITOR) 40 MG tablet; Take 1 tablet (40 mg total) by mouth daily.  Dispense: 90 tablet; Refill: 1  6. Tobacco abuse counseling Tobacco Use: High Risk  .  Smoking Tobacco Use: Current Every Day Smoker  . Smokeless Tobacco Use: Never Used   Smoking cessation instruction/counseling given:  counseled patient on the dangers of tobacco use, advised patient to stop smoking, and reviewed strategies to maximize success  7. Venous stasis ulcer of other part of left lower leg limited to breakdown of skin without varicose veins (HCC) She has an open area to her left lower extremity  Will order doppler arterial with ABI  8. Cellulitis of left lower extremity - cephALEXin (KEFLEX) 250 MG capsule; Take 1 capsule (250 mg total) by mouth 4 (four) times daily for 10 days.  Dispense: 40 capsule; Refill: 0  9. Stage 3b chronic kidney disease ([place])  Encouraged to stay well hydrated with water - CMP14+EGFR     Patient was given opportunity to ask questions. Patient verbalized understanding of the plan and was able to repeat key elements of the plan. All questions were answered to their satisfaction.  [name], FNP   I, [name], FNP, have reviewed all documentation for this visit. The documentation on 10/11/20 for the exam, diagnosis, procedures, and orders are all accurate and complete.   THE PATIENT IS ENCOURAGED TO PRACTICE SOCIAL DISTANCING DUE TO THE COVID-19 PANDEMIC.

## 2020-10-12 LAB — CMP14+EGFR
ALT: 11 IU/L (ref 0–32)
AST: 16 IU/L (ref 0–40)
Albumin/Globulin Ratio: 1.3 (ref 1.2–2.2)
Albumin: 4.1 g/dL (ref 3.8–4.9)
Alkaline Phosphatase: 71 IU/L (ref 44–121)
BUN/Creatinine Ratio: 18 (ref 9–23)
BUN: 25 mg/dL — ABNORMAL HIGH (ref 6–24)
Bilirubin Total: 0.4 mg/dL (ref 0.0–1.2)
CO2: 23 mmol/L (ref 20–29)
Calcium: 9 mg/dL (ref 8.7–10.2)
Chloride: 101 mmol/L (ref 96–106)
Creatinine, Ser: 1.39 mg/dL — ABNORMAL HIGH (ref 0.57–1.00)
GFR calc Af Amer: 48 mL/min/{1.73_m2} — ABNORMAL LOW (ref >59)
GFR calc non Af Amer: 42 mL/min/{1.73_m2} — ABNORMAL LOW (ref >59)
Globulin, Total: 3.1 g/dL (ref 1.5–4.5)
Glucose: 93 mg/dL (ref 65–99)
Potassium: 3.8 mmol/L (ref 3.5–5.2)
Sodium: 140 mmol/L (ref 134–144)
Total Protein: 7.2 g/dL (ref 6.0–8.5)

## 2020-10-13 ENCOUNTER — Other Ambulatory Visit: Payer: Self-pay | Admitting: Nurse Practitioner

## 2020-10-13 DIAGNOSIS — I10 Essential (primary) hypertension: Secondary | ICD-10-CM

## 2020-10-13 DIAGNOSIS — N1831 Chronic kidney disease, stage 3a: Secondary | ICD-10-CM

## 2020-10-13 MED ORDER — DAPAGLIFLOZIN PROPANEDIOL 10 MG PO TABS: 10.0000 mg | ORAL_TABLET | Freq: Every day | ORAL | 2 refills | Status: DC

## 2020-10-18 ENCOUNTER — Encounter: Payer: Self-pay | Admitting: Nurse Practitioner

## 2020-10-19 ENCOUNTER — Ambulatory Visit: Payer: Medicare Other

## 2020-10-20 NOTE — Addendum Note (Signed)
Addended by: [name] F on: 10/20/2020 09:07 AM   Modules accepted: Orders

## 2020-11-01 ENCOUNTER — Ambulatory Visit: Payer: Medicare Other | Admitting: Nurse Practitioner

## 2020-11-01 ENCOUNTER — Telehealth: Payer: Self-pay

## 2020-11-01 NOTE — Telephone Encounter (Signed)
Patient was very stern about calling 3 times about cancellation of appointment. Appointment was cancelled but pt did not get a returned phone call, appointment was rescheduled.

## 2020-11-02 ENCOUNTER — Telehealth: Payer: Self-pay

## 2020-11-02 ENCOUNTER — Telehealth: Payer: Medicare Other

## 2020-11-02 NOTE — Telephone Encounter (Signed)
  Chronic Care Management   Outreach Note  11/02/2020 Name: [name] MRN: [id number] DOB: Feb 05, 1961  Referred by: [name], FNP Reason for referral : Chronic Care Management   An unsuccessful telephone outreach was attempted today. The patient was referred to the case management team for assistance with care management and care coordination.   Follow Up Plan: A HIPAA compliant phone message was left for the patient providing contact information and requesting a return call.  The care management team will reach out to the patient again over the next 10 days.   Daneen Schick, BSW, CDP Social Worker, Certified Dementia Practitioner [place] [facility] [place] [facility] [phone]

## 2020-11-04 ENCOUNTER — Telehealth: Payer: Medicare Other

## 2020-11-04 ENCOUNTER — Other Ambulatory Visit: Payer: Self-pay | Admitting: Nurse Practitioner

## 2020-11-04 ENCOUNTER — Telehealth: Payer: Self-pay

## 2020-11-04 DIAGNOSIS — I872 Venous insufficiency (chronic) (peripheral): Secondary | ICD-10-CM

## 2020-11-04 DIAGNOSIS — L97821 Non-pressure chronic ulcer of other part of left lower leg limited to breakdown of skin: Secondary | ICD-10-CM

## 2020-11-04 NOTE — Telephone Encounter (Signed)
  Chronic Care Management   Outreach Note  11/04/2020 Name: [name] MRN: [id number] DOB: [birth year]  Referred by: [name], FNP Reason for referral : Chronic Care Management   A second unsuccessful telephone outreach was attempted today. The patient was referred to the case management team for assistance with care management and care coordination.   Follow Up Plan: A HIPAA compliant phone message was left for the patient providing contact information and requesting a return call.  The care management team will reach out to the patient again over the next 10 days.   Daneen Schick, BSW, CDP Social Worker, Certified Dementia Practitioner [place] [facility] [place] [facility] [phone]

## 2020-11-05 ENCOUNTER — Ambulatory Visit
Admission: RE | Admit: 2020-11-05 | Discharge: 2020-11-05 | Disposition: A | Discharge status: Home / Self Care | Payer: Medicare Other | Source: Ambulatory Visit | Attending: Nurse Practitioner | Admitting: Nurse Practitioner

## 2020-11-05 DIAGNOSIS — L97821 Non-pressure chronic ulcer of other part of left lower leg limited to breakdown of skin: Secondary | ICD-10-CM

## 2020-11-05 DIAGNOSIS — I872 Venous insufficiency (chronic) (peripheral): Secondary | ICD-10-CM

## 2020-11-12 ENCOUNTER — Telehealth: Payer: Self-pay | Admitting: *Deleted

## 2020-11-12 ENCOUNTER — Ambulatory Visit (INDEPENDENT_AMBULATORY_CARE_PROVIDER_SITE_OTHER): Payer: Medicare Other

## 2020-11-12 ENCOUNTER — Ambulatory Visit: Payer: Medicare Other

## 2020-11-12 ENCOUNTER — Other Ambulatory Visit: Payer: Self-pay

## 2020-11-12 DIAGNOSIS — N1831 Chronic kidney disease, stage 3a: Secondary | ICD-10-CM | POA: Diagnosis not present

## 2020-11-12 DIAGNOSIS — E782 Mixed hyperlipidemia: Secondary | ICD-10-CM

## 2020-11-12 DIAGNOSIS — I509 Heart failure, unspecified: Secondary | ICD-10-CM | POA: Diagnosis not present

## 2020-11-12 DIAGNOSIS — I10 Essential (primary) hypertension: Secondary | ICD-10-CM

## 2020-11-12 DIAGNOSIS — L97821 Non-pressure chronic ulcer of other part of left lower leg limited to breakdown of skin: Secondary | ICD-10-CM

## 2020-11-12 DIAGNOSIS — I872 Venous insufficiency (chronic) (peripheral): Secondary | ICD-10-CM

## 2020-11-12 DIAGNOSIS — I5189 Other ill-defined heart diseases: Secondary | ICD-10-CM

## 2020-11-12 NOTE — Patient Instructions (Signed)
Social Worker Visit Information  Goals we discussed today:  Goals Addressed            This Visit's Progress   . Barriers to Treatment Identified and Managed       Timeframe:  Long-Range Goal Priority:  Medium Start Date:    3.25.22                         Expected End Date: 4.5.22                      Next planned outreach: 4.5.22  Patient Goals/Self-Care Activities . Over the next 21 days, patient will:   - Patient will self administer medications as prescribed Patient will attend all scheduled provider appointments Patient will call provider office for new concerns or questions Review mailed resource information Contact SW as needed prior to next scheduled call        Materials provided: Yes: Provided verbal and written information on housing resources  Ms. Grob was given information about Chronic Care Management services today including:  1. CCM service includes personalized support from designated clinical staff supervised by her physician, including individualized plan of care and coordination with other care providers 2. 24/7 contact phone numbers for assistance for urgent and routine care needs. 3. Service will only be billed when office clinical staff spend 20 minutes or more in a month to coordinate care. 4. Only one practitioner may furnish and bill the service in a calendar month. 5. The patient may stop CCM services at any time (effective at the end of the month) by phone call to the office staff. 6. The patient will be responsible for cost sharing (co-pay) of up to 20% of the service fee (after annual deductible is met).  Patient agreed to services and verbal consent obtained.   The patient verbalized understanding of instructions, educational materials, and care plan provided today and declined offer to receive copy of patient instructions, educational materials, and care plan.   Follow up plan: SW will follow up with patient by phone over the next 21  days.   Daneen Schick, BSW, CDP Social Worker, Certified Dementia Practitioner [place] [facility] [place] [facility] [phone]

## 2020-11-12 NOTE — Chronic Care Management (AMB) (Signed)
Chronic Care Management   CCM RN Visit Note  11/12/2020 Name: [name] MRN: [id number] DOB: [birth year]  Subjective: [name] is a 60 y.o. year old female who is a primary care patient of [name], [place]. The care management team was consulted for assistance with disease management and care coordination needs.    Engaged with patient in collaboration with Daneen Schick BSW by telephone for initial visit in response to provider referral for case management and/or care coordination services.   Consent to Services:  The patient was given the following information about Chronic Care Management services today, agreed to services, and gave verbal consent: 1. CCM service includes personalized support from designated clinical staff supervised by the primary care provider, including individualized plan of care and coordination with other care providers 2. 24/7 contact phone numbers for assistance for urgent and routine care needs. 3. Service will only be billed when office clinical staff spend 20 minutes or more in a month to coordinate care. 4. Only one practitioner may furnish and bill the service in a calendar month. 5.The patient may stop CCM services at any time (effective at the end of the month) by phone call to the office staff. 6. The patient will be responsible for cost sharing (co-pay) of up to 20% of the service fee (after annual deductible is met). Patient agreed to services and consent obtained.  Patient agreed to services and verbal consent obtained.   Assessment: Review of patient past medical history, allergies, medications, health status, including review of consultants reports, laboratory and other test data, was performed as part of comprehensive evaluation and provision of chronic care management services.    CCM Care Plan  Allergies  Allergen Reactions  . Lisinopril Cough    Outpatient Encounter Medications as of 11/12/2020  Medication Sig  . acetaminophen  (TYLENOL) 325 MG tablet Take 650 mg by mouth every 6 (six) hours as needed.  Marland Kitchen amLODipine (NORVASC) 10 MG tablet Take 1 tablet (10 mg total) by mouth daily.  Marland Kitchen aspirin EC 81 MG EC tablet Take 1 tablet (81 mg total) by mouth daily.  Marland Kitchen atorvastatin (LIPITOR) 40 MG tablet Take 1 tablet (40 mg total) by mouth daily.  . dapagliflozin propanediol (FARXIGA) 10 MG TABS tablet Take 1 tablet (10 mg total) by mouth daily before breakfast.  . hydrochlorothiazide (HYDRODIURIL) 25 MG tablet Take 1 tablet (25 mg total) by mouth daily.  Marland Kitchen losartan (COZAAR) 50 MG tablet Take 1 tablet (50 mg total) by mouth daily.  . Magnesium 250 MG TABS Take 1 tablet by mouth daily with evening meal   No facility-administered encounter medications on file as of 11/12/2020.    Patient Active Problem List   Diagnosis Date Noted  . Atypical angina ([place]) 10/11/2020  . Stress 02/27/2019  . Hypoglycemia without diagnosis of diabetes mellitus 09/06/2018  . CKD (chronic kidney disease) stage 3, GFR 30-59 ml/min (HCC) 09/06/2018  . Abdominal pain 09/06/2018  . Chest pain 09/05/2018  . Hypertensive urgency 09/05/2018  . Wrist joint effusion, right 05/30/2017  . Hypertension due to endocrine disorder 01/17/2017  . Hyperaldosteronism ([place]) 01/17/2017  . AKI (acute kidney injury) ([place]) 10/19/2015  . Varicose veins of left lower extremity with complications 25/42/7062  . Anemia 12/25/2014  . Encounter for preventive care 11/11/2014  . Tobacco abuse 11/17/2013  . Diastolic dysfunction without heart failure 06/11/2013  . GERD (gastroesophageal reflux disease) 06/11/2013  . Severe uncontrolled hypertension 05/12/2013  . Hyperlipemia 05/12/2013    Conditions to  be addressed/monitored:HTN, HLD and venous stasis ulcer, tobacco abuse, financial constraints  Care Plan : Hypertension (Adult)  Updates made by Clerance Lav, RN since 11/12/2020 12:00 AM    Problem: Poorly Controlled HTN/Cardiovascular Disease   Priority: High     Long-Range Goal: Knowledge Deficits related to Development of Long Term plan for management of cardiovascular disease   Start Date: 11/12/2020  Expected End Date: 02/10/2021  This Visit's Progress: On track  Priority: High  Note:   Objective:  . Last practice recorded BP readings:  BP Readings from Last 3 Encounters:  10/11/20 (!) 190/100  02/27/19 (!) 200/100  09/07/18 (!) 193/96   Current Barriers:  Marland Kitchen Knowledge Deficits related to basic understanding of hypertension pathophysiology and self care management - patient with poorly controlled hypertension, hyperlipidemia, diastolic dysfunction without heart failure, peripheral venous stasis ulcer  . Difficulty obtaining medications - patient recently out of medications for as many as 2 weeks . Non-adherence to prescribed medication regimen . 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 Does not adhere to provider recommendations re: smoking cessation . Does not adhere to prescribed medication regimen Case Manager Clinical Goal(s):  . patient will verbalize basic understanding of hypertension disease process and self health management plan as evidenced by collaboration with RNCM to address cardiovascular disease management planning Interventions:  . Collaboration with [name], FNP regarding development and update of comprehensive plan of care as evidenced by provider attestation and co-signature . Inter-disciplinary care team collaboration (see longitudinal plan of care) including review of patient CM needs with [facility] BSW . Evaluation of current treatment plan related to hypertension self management and patient's adherence to plan as established by provider. Self-Care Activities: - Calls provider office for new concerns, questions Patient Goals: - check blood pressure 3 times per week - learn about high blood pressure  Follow Up Plan: Telephone follow up appointment with  care management team member scheduled for: next 30 days      Plan:The care management team will reach out to the patient again over the next 30 days.   [place] Management Coordinator Direct Dial:  [phone]  Fax: [phone]

## 2020-11-12 NOTE — Patient Instructions (Signed)
Visit Information  PATIENT GOALS:  Goals Addressed            This Visit's Progress   . Track and Manage My Blood Pressure-Hypertension       Timeframe:  Long-Range Goal Priority:  High Start Date:    11/12/20                        Expected End Date:   02/12/21                     - check blood pressure 3 times per week - learn about hypertension   Why is this important?    You won't feel high blood pressure, but it can still hurt your blood vessels.   High blood pressure can cause heart or kidney problems. It can also cause a stroke.   Making lifestyle changes like losing a little weight or eating less salt will help.   Checking your blood pressure at home and at different times of the day can help to control blood pressure.   If the doctor prescribes medicine remember to take it the way the doctor ordered.   Call the office if you cannot afford the medicine or if there are questions about it.     Notes:        Consent to CCM Services: [name] was given information about Chronic Care Management services today including:  1. CCM service includes personalized support from designated clinical staff supervised by her physician, including individualized plan of care and coordination with other care providers 2. 24/7 contact phone numbers for assistance for urgent and routine care needs. 3. Service will only be billed when office clinical staff spend 20 minutes or more in a month to coordinate care. 4. Only one practitioner may furnish and bill the service in a calendar month. 5. The patient may stop CCM services at any time (effective at the end of the month) by phone call to the office staff. 6. The patient will be responsible for cost sharing (co-pay) of up to 20% of the service fee (after annual deductible is met).  Patient agreed to services and verbal consent obtained.   Patient verbalizes understanding of instructions provided today and agrees to view in  [place].   The care management team will reach out to the patient again over the next 30 days.   [place] Management Coordinator Direct Dial:  [phone]  Fax: [phone]   CLINICAL CARE PLAN: Patient Care Plan: Hypertension (Adult)    Problem Identified: Poorly Controlled HTN/Cardiovascular Disease   Priority: High    Long-Range Goal: Knowledge Deficits related to Development of Long Term plan for management of cardiovascular disease   Start Date: 11/12/2020  Expected End Date: 02/10/2021  This Visit's Progress: On track  Priority: High  Note:   Objective:  . Last practice recorded BP readings:  BP Readings from Last 3 Encounters:  10/11/20 (!) 190/100  02/27/19 (!) 200/100  09/07/18 (!) 193/96   Current Barriers:  Marland Kitchen Knowledge Deficits related to basic understanding of hypertension pathophysiology and self care management - patient with poorly controlled hypertension, hyperlipidemia, diastolic dysfunction without heart failure, peripheral venous stasis ulcer  . Difficulty obtaining medications - patient recently out of medications for as many as 2 weeks . Non-adherence to prescribed medication regimen . 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 Does not adhere to provider recommendations re: smoking cessation . Does not adhere to prescribed medication regimen Case Manager Clinical Goal(s):  . patient will verbalize basic understanding of hypertension disease process and self health management plan as evidenced by collaboration with RNCM to address cardiovascular disease management planning Interventions:  . Collaboration with [name], FNP regarding development and update of comprehensive plan of care as evidenced by provider attestation and co-signature . Inter-disciplinary care team collaboration (see longitudinal plan of care) including review of  patient CM needs with [facility] BSW . Evaluation of current treatment plan related to hypertension self management and patient's adherence to plan as established by provider. Self-Care Activities: - Calls provider office for new concerns, questions Patient Goals: - check blood pressure 3 times per week - learn about high blood pressure  Follow Up Plan: Telephone follow up appointment with care management team member scheduled for: next 30 days

## 2020-11-12 NOTE — Chronic Care Management (AMB) (Signed)
  Chronic Care Management   Note  11/12/2020 Name: [name] MRN: [id number] DOB: Dec 24, 1960  [name] is a 60 y.o. year old female who is a primary care patient of [name], [place]. I reached out to [place] by phone today in response to a referral sent by [name]'s PCP, [name], FNP.      [name] was given information about Chronic Care Management services today including:  1. CCM service includes personalized support from designated clinical staff supervised by her physician, including individualized plan of care and coordination with other care providers 2. 24/7 contact phone numbers for assistance for urgent and routine care needs. 3. Service will only be billed when office clinical staff spend 20 minutes or more in a month to coordinate care. 4. Only one practitioner may furnish and bill the service in a calendar month. 5. The patient may stop CCM services at any time (effective at the end of the month) by phone call to the office staff. 6. The patient will be responsible for cost sharing (co-pay) of up to 20% of the service fee (after annual deductible is met).  Patient agreed to services and verbal consent obtained.   Follow up plan: Telephone appointment with care management team member scheduled for:11/12/2020  Adylee Leonardo  Care Guide, Embedded Care Coordination [place]  Care Management

## 2020-11-12 NOTE — Chronic Care Management (AMB) (Signed)
Chronic Care Management    Social Work Note  11/12/2020 Name: Raneshia Derick MRN: [id number] DOB: 17-May-1961  Oliver Neuwirth is a 60 y.o. year old female who is a primary care patient of [name], [place]. The CCM team was consulted to assist the patient with chronic disease management and/or care coordination needs related to: [employer] .   Engaged with patient by telephone for initial visit in response to provider referral for social work chronic care management and care coordination services.   Consent to Services:  The patient was given the following information about Chronic Care Management services today, agreed to services, and gave verbal consent: 1. CCM service includes personalized support from designated clinical staff supervised by the primary care provider, including individualized plan of care and coordination with other care providers 2. 24/7 contact phone numbers for assistance for urgent and routine care needs. 3. Service will only be billed when office clinical staff spend 20 minutes or more in a month to coordinate care. 4. Only one practitioner may furnish and bill the service in a calendar month. 5.The patient may stop CCM services at any time (effective at the end of the month) by phone call to the office staff. 6. The patient will be responsible for cost sharing (co-pay) of up to 20% of the service fee (after annual deductible is met). Patient agreed to services and consent obtained.  Patient agreed to services and consent obtained.   Assessment: Review of patient past medical history, allergies, medications, and health status, including review of relevant consultants reports was performed today as part of a comprehensive evaluation and provision of chronic care management and care coordination services.     SDOH (Social Determinants of Health) assessments and interventions performed:    Advanced Directives Status: Not addressed in this encounter.  CCM Care  Plan  Allergies  Allergen Reactions  . Lisinopril Cough    Outpatient Encounter Medications as of 11/12/2020  Medication Sig  . acetaminophen (TYLENOL) 325 MG tablet Take 650 mg by mouth every 6 (six) hours as needed.  Marland Kitchen amLODipine (NORVASC) 10 MG tablet Take 1 tablet (10 mg total) by mouth daily.  Marland Kitchen aspirin EC 81 MG EC tablet Take 1 tablet (81 mg total) by mouth daily.  Marland Kitchen atorvastatin (LIPITOR) 40 MG tablet Take 1 tablet (40 mg total) by mouth daily.  . dapagliflozin propanediol (FARXIGA) 10 MG TABS tablet Take 1 tablet (10 mg total) by mouth daily before breakfast.  . hydrochlorothiazide (HYDRODIURIL) 25 MG tablet Take 1 tablet (25 mg total) by mouth daily.  Marland Kitchen losartan (COZAAR) 50 MG tablet Take 1 tablet (50 mg total) by mouth daily.  . Magnesium 250 MG TABS Take 1 tablet by mouth daily with evening meal   No facility-administered encounter medications on file as of 11/12/2020.    Patient Active Problem List   Diagnosis Date Noted  . Atypical angina ([place]) 10/11/2020  . Stress 02/27/2019  . Hypoglycemia without diagnosis of diabetes mellitus 09/06/2018  . CKD (chronic kidney disease) stage 3, GFR 30-59 ml/min (HCC) 09/06/2018  . Abdominal pain 09/06/2018  . Chest pain 09/05/2018  . Hypertensive urgency 09/05/2018  . Wrist joint effusion, right 05/30/2017  . Hypertension due to endocrine disorder 01/17/2017  . Hyperaldosteronism ([place]) 01/17/2017  . AKI (acute kidney injury) ([place]) 10/19/2015  . Varicose veins of left lower extremity with complications 93/79/0240  . Anemia 12/25/2014  . Encounter for preventive care 11/11/2014  . Tobacco abuse 11/17/2013  . Diastolic dysfunction without  heart failure 06/11/2013  . GERD (gastroesophageal reflux disease) 06/11/2013  . Severe uncontrolled hypertension 05/12/2013  . Hyperlipemia 05/12/2013    Conditions to be addressed/monitored: CHF, HTN and CKD Stage III; Housing barriers  Care Plan : Social Work [place]  Updates made  by Daneen Schick since 11/12/2020 12:00 AM    Problem: Barriers to Treatment     Long-Range Goal: Barriers to Treatment Identified and Managed   Start Date: 11/12/2020  Expected End Date: 03/12/2021  Priority: Medium  Note:   Current Barriers:  . Chronic disease management support and education needs related to CHF, HTN, and CKD Stage III   . Housing barriers  Social Worker Clinical Goal(s):  Marland Kitchen Over the next 120 days the patient will work with SW to identify housing resources . Over the next 90 days the patient will engage with RN Care Manager to manage HTN, CHF, and CKD III  CCM SW Interventions:  . Inter-disciplinary care team collaboration (see longitudinal plan of care) . Collaboration with [name], FNP regarding development and update of comprehensive plan of care as evidenced by provider attestation and co-signature . Successful outbound call placed to the patient to screen for care coordination needs . Discussed the patient has been living in a hotel for two years and would like to find permanent housing . Advised the patient SW will link to appropriate resources to assist with housing needs . Mailed the patient information on [employer] as well as [employer] . Performed chart review to note patient was restarted on all medications during last office visit on 2.21.22 . Confirmed the patient has picked these medications up from the pharmacy - patient endorses medication compliance and reports her cellulitis is improving . Collaboration with RN Care Manager regarding interventions and plan for SW to assist patient with resource needs . Scheduled follow up call over the next 21 days  Patient Goals/Self-Care Activities . Over the next 21 days, patient will:   - Patient will self administer medications as prescribed Patient will attend all scheduled provider appointments Patient will call provider office for new concerns or questions Review  mailed resource information Contact SW as needed prior to next scheduled call  Follow Up Plan:  SW will follow up with the patient over the next 21 days       Follow Up Plan: SW will follow up with patient by phone over the next 21 days.      Daneen Schick, BSW, CDP Social Worker, Certified Dementia Practitioner [place] [facility] [place] Management [phone]  Total time spent performing care coordination and/or care management activities with the patient by phone or face to face = 41 minutes.

## 2020-11-16 ENCOUNTER — Encounter: Payer: Self-pay | Admitting: Nurse Practitioner

## 2020-11-16 ENCOUNTER — Ambulatory Visit (INDEPENDENT_AMBULATORY_CARE_PROVIDER_SITE_OTHER): Payer: Medicare Other | Admitting: Nurse Practitioner

## 2020-11-16 ENCOUNTER — Other Ambulatory Visit: Payer: Self-pay

## 2020-11-16 VITALS — BP 138/80 | HR 83 | Temp 98.1°F | Ht 67.6 in | Wt 190.8 lb

## 2020-11-16 DIAGNOSIS — Z720 Tobacco use: Secondary | ICD-10-CM

## 2020-11-16 DIAGNOSIS — I130 Hypertensive heart and chronic kidney disease with heart failure and stage 1 through stage 4 chronic kidney disease, or unspecified chronic kidney disease: Secondary | ICD-10-CM

## 2020-11-16 DIAGNOSIS — R109 Unspecified abdominal pain: Secondary | ICD-10-CM | POA: Diagnosis not present

## 2020-11-16 DIAGNOSIS — I10 Essential (primary) hypertension: Secondary | ICD-10-CM

## 2020-11-16 DIAGNOSIS — I5189 Other ill-defined heart diseases: Secondary | ICD-10-CM

## 2020-11-16 DIAGNOSIS — N1831 Chronic kidney disease, stage 3a: Secondary | ICD-10-CM

## 2020-11-16 DIAGNOSIS — Z87891 Personal history of nicotine dependence: Secondary | ICD-10-CM

## 2020-11-16 LAB — POCT URINALYSIS DIPSTICK
Bilirubin, UA: NEGATIVE
Glucose, UA: POSITIVE — AB
Ketones, UA: NEGATIVE
Nitrite, UA: NEGATIVE
Protein, UA: POSITIVE — AB
Spec Grav, UA: >=1.03 — AB (ref 1.010–1.025)
Urobilinogen, UA: 0.2 E.U./dL
pH, UA: 6 (ref 5.0–8.0)

## 2020-11-16 NOTE — Progress Notes (Signed)
I,Agnes Probert,acting as a Education administrator for [name], FNP.,have documented all relevant documentation on the behalf of [name], FNP,as directed by  [name], FNP while in the presence of [name], [place]. This visit occurred during the SARS-CoV-2 public health emergency.  Safety protocols were in place, including screening questions prior to the visit, additional usage of staff PPE, and extensive cleaning of exam room while observing appropriate contact time as indicated for disinfecting solutions.  Subjective:     Patient ID: [name] , female    DOB: 10-Oct-1960 , 60 y.o.   MRN: [id number]   Chief Complaint  Patient presents with  . Hypertension    HPI  Patient presents today for a blood pressure f/u.   BP Readings from Last 3 Encounters: 11/16/20 : 138/80 10/11/20 : (!) 190/100 02/27/19 : (!) 200/100  Hypertension This is a chronic problem. The current episode started more than 1 year ago. The problem has been gradually worsening since onset. The problem is uncontrolled. Pertinent negatives include no anxiety, blurred vision, chest pain, headaches, palpitations or peripheral edema. Risk factors for coronary artery disease include obesity and sedentary lifestyle. Past treatments include angiotensin blockers, diuretics and calcium channel blockers. There are no compliance problems.  There is no history of angina or kidney disease. There is no history of chronic renal disease or sleep apnea.     Past Medical History:  Diagnosis Date  . Anemia   . CHF (congestive heart failure) ([place])   . Chronic bronchitis ([place])    "probably once/yr" (05/12/2013)  . Diastolic heart failure   . Dyslipidemia    pt denies this hx on 05/12/2013  . Exertional shortness of breath   . GERD (gastroesophageal reflux disease)   . Hypertension   . Hypokalemia   . Venous stasis ulcers ([facility])      Family History  Problem Relation Age of Onset  . Hypertension Mother   . Hypertension Sister    . Diabetes Sister   . Hypertension Brother   . Diabetes Sister      Current Outpatient Medications:  .  acetaminophen (TYLENOL) 325 MG tablet, Take 650 mg by mouth every 6 (six) hours as needed., Disp: , Rfl:  .  amLODipine (NORVASC) 10 MG tablet, Take 1 tablet (10 mg total) by mouth daily., Disp: 90 tablet, Rfl: 3 .  aspirin EC 81 MG EC tablet, Take 1 tablet (81 mg total) by mouth daily., Disp: 30 tablet, Rfl: 3 .  atorvastatin (LIPITOR) 40 MG tablet, Take 1 tablet (40 mg total) by mouth daily., Disp: 90 tablet, Rfl: 1 .  dapagliflozin propanediol (FARXIGA) 10 MG TABS tablet, Take 1 tablet (10 mg total) by mouth daily before breakfast., Disp: 30 tablet, Rfl: 2 .  hydrochlorothiazide (HYDRODIURIL) 25 MG tablet, Take 1 tablet (25 mg total) by mouth daily., Disp: 90 tablet, Rfl: 1 .  losartan (COZAAR) 50 MG tablet, Take 1 tablet (50 mg total) by mouth daily., Disp: 90 tablet, Rfl: 1 .  Magnesium 250 MG TABS, Take 1 tablet by mouth daily with evening meal, Disp: 90 tablet, Rfl: 1   Allergies  Allergen Reactions  . Lisinopril Cough     Review of Systems  Constitutional: Negative.   Eyes: Negative for blurred vision.  Respiratory: Negative.   Cardiovascular: Negative.  Negative for chest pain and palpitations.  Neurological: Negative.  Negative for headaches.  Psychiatric/Behavioral: Negative.      Today's Vitals   11/16/20 1508  BP: 138/80  Pulse: 83  Temp: 98.1 F (36.7 C)  TempSrc: Oral  Weight: 190 lb 12.8 oz (86.5 kg)  Height: 5' 7.6" (1.717 m)  PainSc: 9   PainLoc: Back   Body mass index is 29.36 kg/m.   Objective:  Physical Exam Vitals reviewed.  Constitutional:      General: She is not in acute distress.    Appearance: Normal appearance. She is obese.  Cardiovascular:     Rate and Rhythm: Normal rate and regular rhythm.     Pulses: Normal pulses.     Heart sounds: Normal heart sounds.  Pulmonary:     Effort: Pulmonary effort is normal. No respiratory  distress.     Breath sounds: Normal breath sounds. No wheezing.  Abdominal:     Tenderness: There is no right CVA tenderness or left CVA tenderness.  Musculoskeletal:        General: Normal range of motion.     Cervical back: Normal range of motion.  Skin:    General: Skin is warm and dry.     Capillary Refill: Capillary refill takes less than 2 seconds.     Comments: Left leg no longer has open area  Neurological:     General: No focal deficit present.     Mental Status: She is alert and oriented to person, place, and time.     Cranial Nerves: No cranial nerve deficit.     Motor: No weakness.  Psychiatric:        Mood and Affect: Mood normal.        Behavior: Behavior normal.        Thought Content: Thought content normal.        Judgment: Judgment normal.         Assessment And Plan:     1. Severe uncontrolled hypertension  Chronic, blood pressure is better controlled today and is in fair control - POCT Urinalysis Dipstick (81002)  2. Stage 3a chronic kidney disease ([place])  She is taking [place] for kidney protection and may help with CHF - Protein electrophoresis, serum  3. Diastolic dysfunction without heart failure  She has a previous diagnosis of CHF but denies being followed by Cardiology  Will refer to get established   - Ambulatory referral to Cardiology  4. Right flank pain Glucose in urine is on Farxiga Negative for nitrates  5. Tobacco abuse She has a 36 Pack per year smoking history, continues to smoke cigarettes - CT CHEST LUNG CA SCREEN LOW DOSE W/O CM; Future  6. History of smoking 25-50 pack years - CT CHEST LUNG CA SCREEN LOW DOSE W/O CM; Future     Patient was given opportunity to ask questions. Patient verbalized understanding of the plan and was able to repeat key elements of the plan. All questions were answered to their satisfaction.  [name], FNP   I, [name], FNP, have reviewed all documentation for this visit. The  documentation on 11/16/20 for the exam, diagnosis, procedures, and orders are all accurate and complete.   IF YOU HAVE BEEN REFERRED TO A SPECIALIST, IT MAY TAKE 1-2 WEEKS TO SCHEDULE/PROCESS THE REFERRAL. IF YOU HAVE NOT HEARD FROM US/SPECIALIST IN TWO WEEKS, PLEASE GIVE [place] A CALL AT [phone] X [phone].   THE PATIENT IS ENCOURAGED TO PRACTICE SOCIAL DISTANCING DUE TO THE COVID-19 PANDEMIC.

## 2020-11-16 NOTE — Patient Instructions (Signed)

## 2020-11-17 ENCOUNTER — Ambulatory Visit (INDEPENDENT_AMBULATORY_CARE_PROVIDER_SITE_OTHER): Payer: Medicare Other

## 2020-11-17 VITALS — Ht 67.6 in | Wt 190.0 lb

## 2020-11-17 DIAGNOSIS — Z0000 Encounter for general adult medical examination without abnormal findings: Secondary | ICD-10-CM | POA: Diagnosis not present

## 2020-11-17 DIAGNOSIS — Z1211 Encounter for screening for malignant neoplasm of colon: Secondary | ICD-10-CM

## 2020-11-17 DIAGNOSIS — Z1231 Encounter for screening mammogram for malignant neoplasm of breast: Secondary | ICD-10-CM

## 2020-11-17 NOTE — Patient Instructions (Signed)
[name] , Thank you for taking time to come for your Medicare Wellness Visit. I appreciate your ongoing commitment to your health goals. Please review the following plan we discussed and let me know if I can assist you in the future.   Screening recommendations/referrals: Colonoscopy: ordered today Mammogram: ordered today Bone Density: n/a Recommended yearly ophthalmology/optometry visit for glaucoma screening and checkup Recommended yearly dental visit for hygiene and checkup  Vaccinations: Influenza vaccine: decline Pneumococcal vaccine: decline Tdap vaccine: decline Shingles vaccine: discussed  Covid-19:  08/02/2020, 05/24/2020  Advanced directives: Advance directive discussed with you today.  Conditions/risks identified: smoking  Next appointment: Follow up in one year for your annual wellness visit.   Preventive Care 40-64 Years, Female Preventive care refers to lifestyle choices and visits with your health care provider that can promote health and wellness. What does preventive care include?  A yearly physical exam. This is also called an annual well check.  Dental exams once or twice a year.  Routine eye exams. Ask your health care provider how often you should have your eyes checked.  Personal lifestyle choices, including:  Daily care of your teeth and gums.  Regular physical activity.  Eating a healthy diet.  Avoiding tobacco and drug use.  Limiting alcohol use.  Practicing safe sex.  Taking low-dose aspirin daily starting at age 17.  Taking vitamin and mineral supplements as recommended by your health care provider. What happens during an annual well check? The services and screenings done by your health care provider during your annual well check will depend on your age, overall health, lifestyle risk factors, and family history of disease. Counseling  Your health care provider may ask you questions about your:  Alcohol use.  Tobacco  use.  Drug use.  Emotional well-being.  Home and relationship well-being.  Sexual activity.  Eating habits.  Work and work Statistician.  Method of birth control.  Menstrual cycle.  Pregnancy history. Screening  You may have the following tests or measurements:  Height, weight, and BMI.  Blood pressure.  Lipid and cholesterol levels. These may be checked every 5 years, or more frequently if you are over 41 years old.  Skin check.  Lung cancer screening. You may have this screening every year starting at age 61 if you have a 30-pack-year history of smoking and currently smoke or have quit within the past 15 years.  Fecal occult blood test (FOBT) of the stool. You may have this test every year starting at age 15.  Flexible sigmoidoscopy or colonoscopy. You may have a sigmoidoscopy every 5 years or a colonoscopy every 10 years starting at age 33.  Hepatitis C blood test.  Hepatitis B blood test.  Sexually transmitted disease (STD) testing.  Diabetes screening. This is done by checking your blood sugar (glucose) after you have not eaten for a while (fasting). You may have this done every 1-3 years.  Mammogram. This may be done every 1-2 years. Talk to your health care provider about when you should start having regular mammograms. This may depend on whether you have a family history of breast cancer.  BRCA-related cancer screening. This may be done if you have a family history of breast, ovarian, tubal, or peritoneal cancers.  Pelvic exam and Pap test. This may be done every 3 years starting at age 71. Starting at age 45, this may be done every 5 years if you have a Pap test in combination with an HPV test.  Bone density scan.  This is done to screen for osteoporosis. You may have this scan if you are at high risk for osteoporosis. Discuss your test results, treatment options, and if necessary, the need for more tests with your health care provider. Vaccines  Your  health care provider may recommend certain vaccines, such as:  Influenza vaccine. This is recommended every year.  Tetanus, diphtheria, and acellular pertussis (Tdap, Td) vaccine. You may need a Td booster every 10 years.  Zoster vaccine. You may need this after age 29.  Pneumococcal 13-valent conjugate (PCV13) vaccine. You may need this if you have certain conditions and were not previously vaccinated.  Pneumococcal polysaccharide (PPSV23) vaccine. You may need one or two doses if you smoke cigarettes or if you have certain conditions. Talk to your health care provider about which screenings and vaccines you need and how often you need them. This information is not intended to replace advice given to you by your health care provider. Make sure you discuss any questions you have with your health care provider. Document Released: 09/03/2015 Document Revised: 04/26/2016 Document Reviewed: 06/08/2015 Elsevier Interactive Patient Education  2017 [place] Prevention in the Home Falls can cause injuries. They can happen to people of all ages. There are many things you can do to make your home safe and to help prevent falls. What can I do on the outside of my home?  Regularly fix the edges of walkways and driveways and fix any cracks.  Remove anything that might make you trip as you walk through a door, such as a raised step or threshold.  Trim any bushes or trees on the path to your home.  Use bright outdoor lighting.  Clear any walking paths of anything that might make someone trip, such as rocks or tools.  Regularly check to see if handrails are loose or broken. Make sure that both sides of any steps have handrails.  Any raised decks and porches should have guardrails on the edges.  Have any leaves, snow, or ice cleared regularly.  Use sand or salt on walking paths during winter.  Clean up any spills in your garage right away. This includes oil or grease  spills. What can I do in the bathroom?  Use night lights.  Install grab bars by the toilet and in the tub and shower. Do not use towel bars as grab bars.  Use non-skid mats or decals in the tub or shower.  If you need to sit down in the shower, use a plastic, non-slip stool.  Keep the floor dry. Clean up any water that spills on the floor as soon as it happens.  Remove soap buildup in the tub or shower regularly.  Attach bath mats securely with double-sided non-slip rug tape.  Do not have throw rugs and other things on the floor that can make you trip. What can I do in the bedroom?  Use night lights.  Make sure that you have a light by your bed that is easy to reach.  Do not use any sheets or blankets that are too big for your bed. They should not hang down onto the floor.  Have a firm chair that has side arms. You can use this for support while you get dressed.  Do not have throw rugs and other things on the floor that can make you trip. What can I do in the kitchen?  Clean up any spills right away.  Avoid walking on wet floors.  Keep items that you use a lot in easy-to-reach places.  If you need to reach something above you, use a strong step stool that has a grab bar.  Keep electrical cords out of the way.  Do not use floor polish or wax that makes floors slippery. If you must use wax, use non-skid floor wax.  Do not have throw rugs and other things on the floor that can make you trip. What can I do with my stairs?  Do not leave any items on the stairs.  Make sure that there are handrails on both sides of the stairs and use them. Fix handrails that are broken or loose. Make sure that handrails are as long as the stairways.  Check any carpeting to make sure that it is firmly attached to the stairs. Fix any carpet that is loose or worn.  Avoid having throw rugs at the top or bottom of the stairs. If you do have throw rugs, attach them to the floor with carpet  tape.  Make sure that you have a light switch at the top of the stairs and the bottom of the stairs. If you do not have them, ask someone to add them for you. What else can I do to help prevent falls?  Wear shoes that:  Do not have high heels.  Have rubber bottoms.  Are comfortable and fit you well.  Are closed at the toe. Do not wear sandals.  If you use a stepladder:  Make sure that it is fully opened. Do not climb a closed stepladder.  Make sure that both sides of the stepladder are locked into place.  Ask someone to hold it for you, if possible.  Clearly mark and make sure that you can see:  Any grab bars or handrails.  First and last steps.  Where the edge of each step is.  Use tools that help you move around (mobility aids) if they are needed. These include:  Canes.  Walkers.  Scooters.  Crutches.  Turn on the lights when you go into a dark area. Replace any light bulbs as soon as they burn out.  Set up your furniture so you have a clear path. Avoid moving your furniture around.  If any of your floors are uneven, fix them.  If there are any pets around you, be aware of where they are.  Review your medicines with your doctor. Some medicines can make you feel dizzy. This can increase your chance of falling. Ask your doctor what other things that you can do to help prevent falls. This information is not intended to replace advice given to you by your health care provider. Make sure you discuss any questions you have with your health care provider. Document Released: 06/03/2009 Document Revised: 01/13/2016 Document Reviewed: 09/11/2014 Elsevier Interactive Patient Education  2017 [employer].

## 2020-11-17 NOTE — Progress Notes (Signed)
I connected with [name] today by telephone and verified that I am speaking with the correct person using two identifiers. Location patient: home Location provider: work Persons participating in the virtual visit: Mayerli, Kirst LPN.   I discussed the limitations, risks, security and privacy concerns of performing an evaluation and management service by telephone and the availability of in person appointments. I also discussed with the patient that there may be a patient responsible charge related to this service. The patient expressed understanding and verbally consented to this telephonic visit.    Interactive audio and video telecommunications were attempted between this provider and patient, however failed, due to patient having technical difficulties OR patient did not have access to video capability.  We continued and completed visit with audio only.     Vital signs may be patient reported or missing.  Subjective:   [name] is a 60 y.o. female who presents for an Initial Medicare Annual Wellness Visit.  Review of Systems     Cardiac Risk Factors include: dyslipidemia;hypertension;sedentary lifestyle     Objective:    Today's Vitals   11/17/20 1158  Weight: 190 lb (86.2 kg)  Height: 5' 7.6" (1.717 m)   Body mass index is 29.23 kg/m.  Advanced Directives 11/17/2020 09/05/2018 09/05/2018 10/17/2017 05/30/2017 03/12/2017 02/27/2017  Does Patient Have a Medical Advance Directive? No No No No No No No  Would patient like information on creating a medical advance directive? - No - Patient declined No - Patient declined Yes (MAU/Ambulatory/Procedural Areas - Information given) No - Patient declined No - Patient declined No - Patient declined  Pre-existing out of facility DNR order (yellow form or pink MOST form) - - - - - - -    Current Medications (verified) Outpatient Encounter Medications as of 11/17/2020  Medication Sig  . acetaminophen  (TYLENOL) 325 MG tablet Take 650 mg by mouth every 6 (six) hours as needed.  Marland Kitchen amLODipine (NORVASC) 10 MG tablet Take 1 tablet (10 mg total) by mouth daily.  Marland Kitchen aspirin EC 81 MG EC tablet Take 1 tablet (81 mg total) by mouth daily.  Marland Kitchen atorvastatin (LIPITOR) 40 MG tablet Take 1 tablet (40 mg total) by mouth daily.  . dapagliflozin propanediol (FARXIGA) 10 MG TABS tablet Take 1 tablet (10 mg total) by mouth daily before breakfast.  . hydrochlorothiazide (HYDRODIURIL) 25 MG tablet Take 1 tablet (25 mg total) by mouth daily.  Marland Kitchen losartan (COZAAR) 50 MG tablet Take 1 tablet (50 mg total) by mouth daily.  . Magnesium 250 MG TABS Take 1 tablet by mouth daily with evening meal   No facility-administered encounter medications on file as of 11/17/2020.    Allergies (verified) Lisinopril   History: Past Medical History:  Diagnosis Date  . Anemia   . CHF (congestive heart failure) ([place])   . Chronic bronchitis ([place])    "probably once/yr" (05/12/2013)  . Diastolic heart failure   . Dyslipidemia    pt denies this hx on 05/12/2013  . Exertional shortness of breath   . GERD (gastroesophageal reflux disease)   . Hypertension   . Hypokalemia   . Venous stasis ulcers ([facility])    Past Surgical History:  Procedure Laterality Date  . IR US GUIDE VASC ACCESS RIGHT  02/27/2017  . IR VENOGRAM ADRENAL BI  02/27/2017  . IR VENOGRAM HEPATIC WO HEMODYNAMIC EVALUATION  02/27/2017  . IR VENOGRAM RENAL BI  02/27/2017  . IR VENOUS SAMPLING  02/27/2017  . IR VENOUS  SAMPLING  02/27/2017  . VAGINAL HYSTERECTOMY  1990's   Family History  Problem Relation Age of Onset  . Hypertension Mother   . Hypertension Sister   . Diabetes Sister   . Hypertension Brother   . Diabetes Sister    Social History   Socioeconomic History  . Marital status: Single    Spouse name: Not on file  . Number of children: Not on file  . Years of education: Not on file  . Highest education level: Not on file  Occupational History  .  Occupation: disability  Tobacco Use  . Smoking status: Current Every Day Smoker    Packs/day: 1.00    Years: 36.00    Pack years: 36.00    Types: Cigarettes  . Smokeless tobacco: Never Used  . Tobacco comment: trying to cut back, 3/29 - only had one cigarette today  Vaping Use  . Vaping Use: Never used  Substance and Sexual Activity  . Alcohol use: Yes    Alcohol/week: 7.0 standard drinks    Types: 7 Cans of beer per week    Comment: Beer once a week.  . Drug use: No    Types: Cocaine    Comment: 05/12/2013 "tried it a few days ago; didn't like it"  . Sexual activity: Not on file  Other Topics Concern  . Not on file  Social History Narrative  . Not on file   Social Determinants of Health   Financial Resource Strain: Low Risk   . Difficulty of Paying Living Expenses: Not hard at all  Food Insecurity: No Food Insecurity  . Worried About Charity fundraiser in the Last Year: Never true  . Ran Out of Food in the Last Year: Never true  Transportation Needs: No Transportation Needs  . Lack of Transportation (Medical): No  . Lack of Transportation (Non-Medical): No  Physical Activity: Inactive  . Days of Exercise per Week: 0 days  . Minutes of Exercise per Session: 0 min  Stress: No Stress Concern Present  . Feeling of Stress : Not at all  Social Connections: Not on file    Tobacco Counseling Ready to quit: Yes Counseling given: Not Answered Comment: trying to cut back, 3/29 - only had one cigarette today   Clinical Intake:  Pre-visit preparation completed: Yes  Pain : No/denies pain     Nutritional Status: BMI 25 -29 Overweight Nutritional Risks: None Diabetes: No  How often do you need to have someone help you when you read instructions, pamphlets, or other written materials from your doctor or pharmacy?: 1 - Never What is the last grade level you completed in school?: some college  Diabetic? no  Interpreter Needed?: No  Information entered by :: NAllen  LPN   Activities of Daily Living In your present state of health, do you have any difficulty performing the following activities: 11/17/2020  Hearing? N  Vision? Y  Comment blurriness at times  Difficulty concentrating or making decisions? N  Walking or climbing stairs? Y  Dressing or bathing? N  Doing errands, shopping? N  Preparing Food and eating ? N  Using the Toilet? N  In the past six months, have you accidently leaked urine? N  Do you have problems with loss of bowel control? N  Managing your Medications? N  Managing your Finances? N  Some recent data might be hidden    Patient Care Team: [name], FNP as PCP - General ([place]) Christin Fudge, MD (Inactive) as  Consulting Physician (Surgery) Daneen Schick as [place] [facility] [place], Claudette Stapler, RN as [place] [facility] [place], Claudette Stapler, RN as Case Manager  Indicate any recent [place] you may have received from other than [facility] providers in the past year (date may be approximate).     Assessment:   This is a routine wellness examination for [place].  Hearing/Vision screen  Hearing Screening   125Hz  250Hz  500Hz  1000Hz  2000Hz  3000Hz  4000Hz  6000Hz  8000Hz   Right ear:           Left ear:           Vision Screening Comments: No regular eye exams  Dietary issues and exercise activities discussed: Current Exercise Habits: The patient does not participate in regular exercise at present  Goals    . Barriers to Treatment Identified and Managed     Timeframe:  Long-Range Goal Priority:  Medium Start Date:    3.25.22                         Expected End Date: 4.5.22                      Next planned outreach: 4.5.22  Patient Goals/Self-Care Activities . Over the next 21 days, patient will:   - Patient will self administer medications as prescribed Patient will attend all scheduled provider appointments Patient will call provider office for new concerns or  questions Review mailed resource information Contact SW as needed prior to next scheduled call    . Patient Stated     11/17/2020, wants kidneys and BP to stay under control    . Track and Manage My Blood Pressure-Hypertension     Timeframe:  Long-Range Goal Priority:  High Start Date:    11/12/20                        Expected End Date:   02/12/21                     - check blood pressure 3 times per week - learn about hypertension   Why is this important?    You won't feel high blood pressure, but it can still hurt your blood vessels.   High blood pressure can cause heart or kidney problems. It can also cause a stroke.   Making lifestyle changes like losing a little weight or eating less salt will help.   Checking your blood pressure at home and at different times of the day can help to control blood pressure.   If the doctor prescribes medicine remember to take it the way the doctor ordered.   Call the office if you cannot afford the medicine or if there are questions about it.     Notes:       Depression Screen PHQ 2/9 Scores 11/17/2020 10/11/2020 02/27/2019 10/17/2017 05/30/2017 03/12/2017 02/12/2017  PHQ - 2 Score 1 0 0 0 0 0 0  Exception Documentation - - - - - - -    Fall Risk Fall Risk  11/17/2020 02/27/2019 10/17/2017 05/30/2017 03/12/2017  Falls in the past year? 0 0 No No Yes  Number falls in past yr: - - - - 1  Injury with Fall? - - - - No  Risk for fall due to : Medication side effect - - - Impaired balance/gait  Follow up Falls evaluation completed;Education provided;Falls prevention  discussed - - - Falls prevention discussed    FALL RISK PREVENTION PERTAINING TO THE HOME:  Any stairs in or around the home? Yes  If so, are there any without handrails? No  Home free of loose throw rugs in walkways, pet beds, electrical cords, etc? Yes  Adequate lighting in your home to reduce risk of falls? Yes   ASSISTIVE DEVICES UTILIZED TO PREVENT FALLS:  Life alert? No   Use of a cane, walker or w/c? No  Grab bars in the bathroom? No  Shower chair or bench in shower? No  Elevated toilet seat or a handicapped toilet? Yes   TIMED UP AND GO:  Was the test performed? No . .   Cognitive Function:     6CIT Screen 11/17/2020  What Year? 0 points  What month? 0 points  What time? 0 points  Count back from 20 0 points  Months in reverse 0 points  Repeat phrase 2 points  Total Score 2    Immunizations Immunization History  Administered Date(s) Administered  . PFIZER(Purple Top)SARS-COV-2 Vaccination 05/24/2020, 08/02/2020    TDAP status: Due, Education has been provided regarding the importance of this vaccine. Advised may receive this vaccine at local pharmacy or Health Dept. Aware to provide a copy of the vaccination record if obtained from local pharmacy or Health Dept. Verbalized acceptance and understanding.  Flu Vaccine status: Declined, Education has been provided regarding the importance of this vaccine but patient still declined. Advised may receive this vaccine at local pharmacy or Health Dept. Aware to provide a copy of the vaccination record if obtained from local pharmacy or Health Dept. Verbalized acceptance and understanding.  Pneumococcal vaccine status: Declined,  Education has been provided regarding the importance of this vaccine but patient still declined. Advised may receive this vaccine at local pharmacy or Health Dept. Aware to provide a copy of the vaccination record if obtained from local pharmacy or Health Dept. Verbalized acceptance and understanding.   Covid-19 vaccine status: Completed vaccines  Qualifies for Shingles Vaccine? Yes   Zostavax completed No   Shingrix Completed?: No.    Education has been provided regarding the importance of this vaccine. Patient has been advised to call insurance company to determine out of pocket expense if they have not yet received this vaccine. Advised may also receive vaccine at local  pharmacy or Health Dept. Verbalized acceptance and understanding.  Screening Tests Health Maintenance  Topic Date Due  . Hepatitis C Screening  Never done  . PNEUMOCOCCAL POLYSACCHARIDE VACCINE AGE 34-64 HIGH RISK  Never done  . TETANUS/TDAP  Never done  . COLONOSCOPY (Pts 45-76yrs Insurance coverage will need to be confirmed)  Never done  . MAMMOGRAM  Never done  . INFLUENZA VACCINE  Never done  . COVID-19 Vaccine (3 - Booster for Pfizer series) 01/31/2021  . HIV Screening  Completed  . HPV VACCINES  Aged Out    Health Maintenance  Health Maintenance Due  Topic Date Due  . Hepatitis C Screening  Never done  . PNEUMOCOCCAL POLYSACCHARIDE VACCINE AGE 34-64 HIGH RISK  Never done  . TETANUS/TDAP  Never done  . COLONOSCOPY (Pts 45-70yrs Insurance coverage will need to be confirmed)  Never done  . MAMMOGRAM  Never done  . INFLUENZA VACCINE  Never done    Colorectal cancer screening: Referral to GI placed today. Pt aware the office will call re: appt.  Mammogram status: Ordered today. Pt provided with contact info and advised to call to  schedule appt.   Bone Density status: due  Lung Cancer Screening: (Low Dose CT Chest recommended if Age 19-80 years, 30 pack-year currently smoking OR have quit w/in 15years.) does qualify.   Lung Cancer Screening Referral: scheduled 12/03/2020  Additional Screening:  Hepatitis C Screening: does qualify; due  Vision Screening: Recommended annual ophthalmology exams for early detection of glaucoma and other disorders of the eye. Is the patient up to date with their annual eye exam?  No  Who is the provider or what is the name of the office in which the patient attends annual eye exams? none If pt is not established with a provider, would they like to be referred to a provider to establish care? No .   Dental Screening: Recommended annual dental exams for proper oral hygiene  Community Resource Referral / Chronic Care Management: CRR required  this visit?  No   CCM required this visit?  No      Plan:     I have personally reviewed and noted the following in the patient's chart:   . Medical and social history . Use of alcohol, tobacco or illicit drugs  . Current medications and supplements . Functional ability and status . Nutritional status . Physical activity . Advanced directives . List of other physicians . Hospitalizations, surgeries, and ER visits in previous 12 months . Vitals . Screenings to include cognitive, depression, and falls . Referrals and appointments  In addition, I have reviewed and discussed with patient certain preventive protocols, quality metrics, and best practice recommendations. A written personalized care plan for preventive services as well as general preventive health recommendations were provided to patient.     Kellie Simmering, LPN   [id number]   Nurse Notes:

## 2020-11-18 LAB — PROTEIN ELECTROPHORESIS, SERUM
A/G Ratio: 1 (ref 0.7–1.7)
Albumin ELP: 4.1 g/dL (ref 2.9–4.4)
Alpha 1: 0.3 g/dL (ref 0.0–0.4)
Alpha 2: 1 g/dL (ref 0.4–1.0)
Beta: 1.2 g/dL (ref 0.7–1.3)
Gamma Globulin: 1.6 g/dL (ref 0.4–1.8)
Globulin, Total: 4.1 g/dL — ABNORMAL HIGH (ref 2.2–3.9)
Total Protein: 8.2 g/dL (ref 6.0–8.5)

## 2020-11-22 LAB — PHOSPHORUS: Phosphorus: 4 mg/dL (ref 3.0–4.3)

## 2020-11-22 LAB — SPECIMEN STATUS REPORT

## 2020-11-23 ENCOUNTER — Ambulatory Visit: Payer: Medicare Other

## 2020-11-23 DIAGNOSIS — I509 Heart failure, unspecified: Secondary | ICD-10-CM

## 2020-11-23 DIAGNOSIS — N1832 Chronic kidney disease, stage 3b: Secondary | ICD-10-CM

## 2020-11-23 NOTE — Chronic Care Management (AMB) (Signed)
Chronic Care Management    Social Work Note  11/23/2020 Name: [name] MRN: [id number] DOB: 06/11/1961  [name] is a 60 y.o. year old female who is a primary care patient of [name], [name]. The CCM team was consulted to assist the patient with chronic disease management and/or care coordination needs related to: Housing.   Engaged with patient by telephone for follow up visit in response to provider referral for social work chronic care management and care coordination services.   Consent to Services:  The patient was given information about Chronic Care Management services, agreed to services, and gave verbal consent prior to initiation of services.  Please see initial visit note for detailed documentation.   Patient agreed to services and consent obtained.   Assessment: Review of patient past medical history, allergies, medications, and health status, including review of relevant consultants reports was performed today as part of a comprehensive evaluation and provision of chronic care management and care coordination services.     SDOH (Social Determinants of Health) assessments and interventions performed:    Advanced Directives Status: Not addressed in this encounter.  CCM Care Plan  Allergies  Allergen Reactions  . Lisinopril Cough    Outpatient Encounter Medications as of 11/23/2020  Medication Sig  . acetaminophen (TYLENOL) 325 MG tablet Take 650 mg by mouth every 6 (six) hours as needed.  Marland Kitchen amLODipine (NORVASC) 10 MG tablet Take 1 tablet (10 mg total) by mouth daily.  Marland Kitchen aspirin EC 81 MG EC tablet Take 1 tablet (81 mg total) by mouth daily.  Marland Kitchen atorvastatin (LIPITOR) 40 MG tablet Take 1 tablet (40 mg total) by mouth daily.  . dapagliflozin propanediol (FARXIGA) 10 MG TABS tablet Take 1 tablet (10 mg total) by mouth daily before breakfast.  . hydrochlorothiazide (HYDRODIURIL) 25 MG tablet Take 1 tablet (25 mg total) by mouth daily.  Marland Kitchen losartan (COZAAR) 50  MG tablet Take 1 tablet (50 mg total) by mouth daily.  . Magnesium 250 MG TABS Take 1 tablet by mouth daily with evening meal   No facility-administered encounter medications on file as of 11/23/2020.    Patient Active Problem List   Diagnosis Date Noted  . Atypical angina ([place]) 10/11/2020  . Stress 02/27/2019  . Hypoglycemia without diagnosis of diabetes mellitus 09/06/2018  . CKD (chronic kidney disease) stage 3, GFR 30-59 ml/min ([facility]) 09/06/2018  . Abdominal pain 09/06/2018  . Chest pain 09/05/2018  . Hypertensive urgency 09/05/2018  . Wrist joint effusion, right 05/30/2017  . Hypertension due to endocrine disorder 01/17/2017  . Hyperaldosteronism ([place]) 01/17/2017  . AKI (acute kidney injury) ([place]) 10/19/2015  . Varicose veins of left lower extremity with complications 20/25/4270  . Anemia 12/25/2014  . Encounter for preventive care 11/11/2014  . Tobacco abuse 11/17/2013  . Diastolic dysfunction without heart failure 06/11/2013  . GERD (gastroesophageal reflux disease) 06/11/2013  . Severe uncontrolled hypertension 05/12/2013  . Hyperlipemia 05/12/2013    Conditions to be addressed/monitored: CHF and HTN; Housing barriers  Care Plan : Social Work [place]  Updates made by [name] since 11/23/2020 12:00 AM    Problem: Barriers to Treatment     Long-Range Goal: Barriers to Treatment Identified and Managed   Start Date: 11/12/2020  Expected End Date: 03/12/2021  Priority: Medium  Note:   Current Barriers:  . Chronic disease management support and education needs related to CHF, HTN, and CKD Stage III   . Housing barriers  Social Worker Clinical Goal(s):  .  Over the next 120 days the patient will work with SW to identify housing resources . Over the next 90 days the patient will engage with RN Care Manager to manage HTN, CHF, and CKD III  CCM SW Interventions:  . Inter-disciplinary care team collaboration (see longitudinal plan of care) . Collaboration with  Summer Brine, FNP regarding development and update of comprehensive plan of care as evidenced by provider attestation and co-signature . Successful outbound call placed to the patient to assess for goal progression . Determined the patient has yet to visit her friends home to retrieve her mail . Reviewed resources SW mailed to the patient . Advised the patient to contact [employer] to determine if she is eligible for any housing programs in the area - patient stated understanding . Scheduled follow up call over the next month  Patient Goals/Self-Care Activities . Over the next 21 days, patient will:   - Patient will self administer medications as prescribed Patient will attend all scheduled provider appointments Patient will call provider office for new concerns or questions Review mailed resource information Contact SW as needed prior to next scheduled call  Follow Up Plan:  SW will follow up with the patient over the next 21 days       Follow Up Plan: SW will follow up with patient by phone over the next month      [name], BSW, CDP Social Worker, Certified Dementia Practitioner [place] / [place] [facility] [phone]  Total time spent performing care coordination and/or care management activities with the patient by phone or face to face = 15 minutes.

## 2020-11-23 NOTE — Patient Instructions (Signed)
Social Worker Visit Information  Goals we discussed today:  Goals Addressed            This Visit's Progress   . Barriers to Treatment Identified and Managed       Timeframe:  Long-Range Goal Priority:  Medium Start Date:    3.25.22                         Expected End Date: 4.5.22                      Next planned outreach: 4.27.22  Patient Goals/Self-Care Activities . Over the next 21 days, patient will:   - Patient will self administer medications as prescribed Patient will attend all scheduled provider appointments Patient will call provider office for new concerns or questions Review mailed resource information Contact SW as needed prior to next scheduled call        Materials Provided: Verbal education about housing resources provided by phone  Follow Up Plan: SW will follow up with patient by phone over the next month   Daneen Schick, BSW, CDP Social Worker, Certified Dementia Practitioner [place] / [place] [facility] [phone]

## 2020-12-03 ENCOUNTER — Inpatient Hospital Stay: Admission: RE | Admit: 2020-12-03 | Payer: Medicare Other | Source: Ambulatory Visit

## 2020-12-04 ENCOUNTER — Emergency Department (HOSPITAL_COMMUNITY): Payer: Medicare Other

## 2020-12-04 ENCOUNTER — Other Ambulatory Visit: Payer: Self-pay

## 2020-12-04 ENCOUNTER — Emergency Department (HOSPITAL_COMMUNITY)
Admission: EM | Admit: 2020-12-04 | Discharge: 2020-12-05 | Disposition: A | Discharge status: Home / Self Care | Payer: Medicare Other | Attending: Emergency Medicine | Admitting: Emergency Medicine

## 2020-12-04 DIAGNOSIS — Z789 Other specified health status: Secondary | ICD-10-CM

## 2020-12-04 DIAGNOSIS — R55 Syncope and collapse: Secondary | ICD-10-CM | POA: Diagnosis not present

## 2020-12-04 DIAGNOSIS — F17210 Nicotine dependence, cigarettes, uncomplicated: Secondary | ICD-10-CM | POA: Diagnosis not present

## 2020-12-04 DIAGNOSIS — F14988 Cocaine use, unspecified with other cocaine-induced disorder: Secondary | ICD-10-CM | POA: Diagnosis not present

## 2020-12-04 DIAGNOSIS — I130 Hypertensive heart and chronic kidney disease with heart failure and stage 1 through stage 4 chronic kidney disease, or unspecified chronic kidney disease: Secondary | ICD-10-CM | POA: Insufficient documentation

## 2020-12-04 DIAGNOSIS — Z7982 Long term (current) use of aspirin: Secondary | ICD-10-CM | POA: Insufficient documentation

## 2020-12-04 DIAGNOSIS — Z20822 Contact with and (suspected) exposure to covid-19: Secondary | ICD-10-CM | POA: Insufficient documentation

## 2020-12-04 DIAGNOSIS — R002 Palpitations: Secondary | ICD-10-CM | POA: Diagnosis present

## 2020-12-04 DIAGNOSIS — F1010 Alcohol abuse, uncomplicated: Secondary | ICD-10-CM | POA: Insufficient documentation

## 2020-12-04 DIAGNOSIS — F329 Major depressive disorder, single episode, unspecified: Secondary | ICD-10-CM | POA: Insufficient documentation

## 2020-12-04 DIAGNOSIS — F322 Major depressive disorder, single episode, severe without psychotic features: Secondary | ICD-10-CM | POA: Diagnosis not present

## 2020-12-04 DIAGNOSIS — I5030 Unspecified diastolic (congestive) heart failure: Secondary | ICD-10-CM | POA: Diagnosis not present

## 2020-12-04 DIAGNOSIS — R0602 Shortness of breath: Secondary | ICD-10-CM | POA: Insufficient documentation

## 2020-12-04 DIAGNOSIS — F32A Depression, unspecified: Secondary | ICD-10-CM

## 2020-12-04 DIAGNOSIS — F1999 Other psychoactive substance use, unspecified with unspecified psychoactive substance-induced disorder: Secondary | ICD-10-CM | POA: Diagnosis present

## 2020-12-04 DIAGNOSIS — N183 Chronic kidney disease, stage 3 unspecified: Secondary | ICD-10-CM | POA: Insufficient documentation

## 2020-12-04 DIAGNOSIS — Z79899 Other long term (current) drug therapy: Secondary | ICD-10-CM | POA: Insufficient documentation

## 2020-12-04 DIAGNOSIS — Z7289 Other problems related to lifestyle: Secondary | ICD-10-CM

## 2020-12-04 DIAGNOSIS — F1410 Cocaine abuse, uncomplicated: Secondary | ICD-10-CM | POA: Diagnosis present

## 2020-12-04 DIAGNOSIS — R600 Localized edema: Secondary | ICD-10-CM | POA: Diagnosis not present

## 2020-12-04 LAB — CBC WITH DIFFERENTIAL/PLATELET
Abs Immature Granulocytes: 0.01 10*3/uL (ref 0.00–0.07)
Basophils Absolute: 0 10*3/uL (ref 0.0–0.1)
Basophils Relative: 0 %
Eosinophils Absolute: 0 10*3/uL (ref 0.0–0.5)
Eosinophils Relative: 1 %
HCT: 37.6 % (ref 36.0–46.0)
Hemoglobin: 12 g/dL (ref 12.0–15.0)
Immature Granulocytes: 0 %
Lymphocytes Relative: 37 %
Lymphs Abs: 1.8 10*3/uL (ref 0.7–4.0)
MCH: 29.7 pg (ref 26.0–34.0)
MCHC: 31.9 g/dL (ref 30.0–36.0)
MCV: 93.1 fL (ref 80.0–100.0)
Monocytes Absolute: 0.4 10*3/uL (ref 0.1–1.0)
Monocytes Relative: 9 %
Neutro Abs: 2.6 10*3/uL (ref 1.7–7.7)
Neutrophils Relative %: 53 %
Platelets: 261 10*3/uL (ref 150–400)
RBC: 4.04 MIL/uL (ref 3.87–5.11)
RDW: 12.3 % (ref 11.5–15.5)
WBC: 4.9 10*3/uL (ref 4.0–10.5)
nRBC: 0 % (ref 0.0–0.2)

## 2020-12-04 LAB — COMPREHENSIVE METABOLIC PANEL
ALT: 20 U/L (ref 0–44)
AST: 28 U/L (ref 15–41)
Albumin: 3.9 g/dL (ref 3.5–5.0)
Alkaline Phosphatase: 63 U/L (ref 38–126)
Anion gap: 12 (ref 5–15)
BUN: 41 mg/dL — ABNORMAL HIGH (ref 6–20)
CO2: 26 mmol/L (ref 22–32)
Calcium: 8.5 mg/dL — ABNORMAL LOW (ref 8.9–10.3)
Chloride: 102 mmol/L (ref 98–111)
Creatinine, Ser: 1.48 mg/dL — ABNORMAL HIGH (ref 0.44–1.00)
GFR, Estimated: 40 mL/min — ABNORMAL LOW (ref >60)
Glucose, Bld: 77 mg/dL (ref 70–99)
Potassium: 3.3 mmol/L — ABNORMAL LOW (ref 3.5–5.1)
Sodium: 140 mmol/L (ref 135–145)
Total Bilirubin: 0.5 mg/dL (ref 0.3–1.2)
Total Protein: 7.8 g/dL (ref 6.5–8.1)

## 2020-12-04 LAB — SALICYLATE LEVEL: Salicylate Lvl: <7 mg/dL — ABNORMAL LOW (ref 7.0–30.0)

## 2020-12-04 LAB — ACETAMINOPHEN LEVEL: Acetaminophen (Tylenol), Serum: <10 ug/mL — ABNORMAL LOW (ref 10–30)

## 2020-12-04 LAB — TROPONIN I (HIGH SENSITIVITY)
Troponin I (High Sensitivity): 22 ng/L — ABNORMAL HIGH (ref <18)
Troponin I (High Sensitivity): 26 ng/L — ABNORMAL HIGH (ref <18)

## 2020-12-04 LAB — RESP PANEL BY RT-PCR (FLU A&B, COVID) ARPGX2
Influenza A by PCR: NEGATIVE
Influenza B by PCR: NEGATIVE
SARS Coronavirus 2 by RT PCR: NEGATIVE

## 2020-12-04 LAB — ETHANOL: Alcohol, Ethyl (B): <10 mg/dL (ref <10)

## 2020-12-04 LAB — BRAIN NATRIURETIC PEPTIDE: B Natriuretic Peptide: 80 pg/mL (ref 0.0–100.0)

## 2020-12-04 MED ORDER — HYDROCHLOROTHIAZIDE 25 MG PO TABS
25.0000 mg | ORAL_TABLET | Freq: Every day | ORAL | Status: DC
  Administered 2020-12-04 – 2020-12-05 (×2): 25 mg via ORAL
  Filled 2020-12-04 (×2): qty 1

## 2020-12-04 MED ORDER — DAPAGLIFLOZIN PROPANEDIOL 10 MG PO TABS
10.0000 mg | ORAL_TABLET | Freq: Every day | ORAL | Status: DC
  Administered 2020-12-05: 10 mg via ORAL
  Filled 2020-12-04 (×2): qty 1

## 2020-12-04 MED ORDER — LORAZEPAM 2 MG/ML IJ SOLN: 0.0000 mg | Freq: Two times a day (BID) | INTRAMUSCULAR | Status: DC

## 2020-12-04 MED ORDER — MAGNESIUM OXIDE 400 (241.3 MG) MG PO TABS
400.0000 mg | ORAL_TABLET | Freq: Every day | ORAL | Status: DC
  Administered 2020-12-04: 400 mg via ORAL
  Filled 2020-12-04: qty 1

## 2020-12-04 MED ORDER — ALUM & MAG HYDROXIDE-SIMETH 200-200-20 MG/5ML PO SUSP: 30.0000 mL | Freq: Four times a day (QID) | ORAL | Status: DC | PRN

## 2020-12-04 MED ORDER — LORAZEPAM 1 MG PO TABS: 0.0000 mg | ORAL_TABLET | Freq: Four times a day (QID) | ORAL | Status: DC

## 2020-12-04 MED ORDER — ATORVASTATIN CALCIUM 40 MG PO TABS: 40.0000 mg | ORAL_TABLET | Freq: Every day | ORAL | Status: DC

## 2020-12-04 MED ORDER — LORAZEPAM 1 MG PO TABS: 0.0000 mg | ORAL_TABLET | Freq: Two times a day (BID) | ORAL | Status: DC

## 2020-12-04 MED ORDER — LOSARTAN POTASSIUM 25 MG PO TABS: 50.0000 mg | ORAL_TABLET | Freq: Every day | ORAL | Status: DC

## 2020-12-04 MED ORDER — ASPIRIN EC 81 MG PO TBEC
81.0000 mg | DELAYED_RELEASE_TABLET | Freq: Every day | ORAL | Status: DC
  Administered 2020-12-04 – 2020-12-05 (×2): 81 mg via ORAL
  Filled 2020-12-04 (×2): qty 1

## 2020-12-04 MED ORDER — ACETAMINOPHEN 325 MG PO TABS: 650.0000 mg | ORAL_TABLET | ORAL | Status: DC | PRN

## 2020-12-04 MED ORDER — ONDANSETRON HCL 4 MG PO TABS: 4.0000 mg | ORAL_TABLET | Freq: Three times a day (TID) | ORAL | Status: DC | PRN

## 2020-12-04 MED ORDER — LABETALOL HCL 5 MG/ML IV SOLN
20.0000 mg | Freq: Once | INTRAVENOUS | Status: AC
  Administered 2020-12-04: 20 mg via INTRAVENOUS
  Filled 2020-12-04: qty 4

## 2020-12-04 MED ORDER — LORAZEPAM 2 MG/ML IJ SOLN
0.0000 mg | Freq: Four times a day (QID) | INTRAMUSCULAR | Status: DC
  Administered 2020-12-04: 2 mg via INTRAVENOUS
  Filled 2020-12-04: qty 1

## 2020-12-04 MED ORDER — AMLODIPINE BESYLATE 5 MG PO TABS
10.0000 mg | ORAL_TABLET | Freq: Every day | ORAL | Status: DC
  Administered 2020-12-05: 10 mg via ORAL
  Filled 2020-12-04: qty 2

## 2020-12-04 MED ORDER — THIAMINE HCL 100 MG/ML IJ SOLN: 100.0000 mg | Freq: Every day | INTRAMUSCULAR | Status: DC

## 2020-12-04 MED ORDER — SODIUM CHLORIDE 0.9 % IV BOLUS
1000.0000 mL | Freq: Once | INTRAVENOUS | Status: AC
  Administered 2020-12-04: 1000 mL via INTRAVENOUS

## 2020-12-04 MED ORDER — THIAMINE HCL 100 MG PO TABS
100.0000 mg | ORAL_TABLET | Freq: Every day | ORAL | Status: DC
  Administered 2020-12-04 – 2020-12-05 (×2): 100 mg via ORAL
  Filled 2020-12-04 (×2): qty 1

## 2020-12-04 MED ORDER — AMLODIPINE BESYLATE 5 MG PO TABS
10.0000 mg | ORAL_TABLET | Freq: Once | ORAL | Status: AC
  Administered 2020-12-04: 10 mg via ORAL
  Filled 2020-12-04: qty 2

## 2020-12-04 MED ORDER — NICOTINE 21 MG/24HR TD PT24
21.0000 mg | MEDICATED_PATCH | Freq: Every day | TRANSDERMAL | Status: DC
  Filled 2020-12-04: qty 1

## 2020-12-04 NOTE — ED Triage Notes (Signed)
Patient bib [facility] , patient states she had a 6 pack of beer around 5am and felt "bad". Patient not able to give specifics, just states she feels bad. Denies pain.
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
## 2020-12-04 NOTE — ED Notes (Signed)
MSE needed

## 2020-12-05 DIAGNOSIS — F1410 Cocaine abuse, uncomplicated: Secondary | ICD-10-CM | POA: Diagnosis present

## 2020-12-05 DIAGNOSIS — R002 Palpitations: Secondary | ICD-10-CM | POA: Diagnosis not present

## 2020-12-05 DIAGNOSIS — F1999 Other psychoactive substance use, unspecified with unspecified psychoactive substance-induced disorder: Secondary | ICD-10-CM | POA: Diagnosis present

## 2020-12-05 LAB — RAPID URINE DRUG SCREEN, HOSP PERFORMED
Amphetamines: NOT DETECTED
Barbiturates: NOT DETECTED
Benzodiazepines: NOT DETECTED
Cocaine: POSITIVE — AB
Opiates: NOT DETECTED
Tetrahydrocannabinol: NOT DETECTED

## 2020-12-05 LAB — CBG MONITORING, ED: Glucose-Capillary: 93 mg/dL (ref 70–99)

## 2020-12-05 MED ORDER — AMLODIPINE BESYLATE 5 MG PO TABS: 5.0000 mg | ORAL_TABLET | Freq: Once | ORAL | Status: DC

## 2020-12-05 MED ORDER — AMLODIPINE BESYLATE 5 MG PO TABS
10.0000 mg | ORAL_TABLET | Freq: Once | ORAL | Status: AC
  Administered 2020-12-05: 10 mg via ORAL
  Filled 2020-12-05: qty 2

## 2020-12-05 MED ORDER — LABETALOL HCL 5 MG/ML IV SOLN
10.0000 mg | Freq: Once | INTRAVENOUS | Status: AC
  Administered 2020-12-05: 10 mg via INTRAVENOUS
  Filled 2020-12-05: qty 4

## 2020-12-05 NOTE — ED Provider Notes (Signed)
Emergency Medicine Observation Re-evaluation Note  Azora Bonzo is a 60 y.o. female, seen on rounds today.  Pt initially presented to the ED for complaints of depression and substance abuse. Currently, the patient is awaiting TTS evaluation for substance troubles..  Physical Exam  BP (!) 213/88   Pulse 69   Temp 98.9 F (37.2 C) (Oral)   Resp 20   Ht 5\' 7"  (1.702 m)   Wt 86.2 kg   SpO2 100%   BMI 29.76 kg/m  Physical Exam General: Resting comfortably, just ate breakfast difficulty Cardiac: No murmur appreciated Lungs: Clear Psych: Resting calmly  ED Course / MDM  EKG:EKG Interpretation  Date/Time:  Saturday December 04 2020 08:52:52 EDT Ventricular Rate:  67 PR Interval:  282 QRS Duration: 103 QT Interval:  447 QTC Calculation: 472 R Axis:   -24 Text Interpretation: Sinus rhythm Prolonged PR interval Probable left atrial enlargement Borderline left axis deviation Anterior infarct, old normal axis no acute ischemia Confirmed by Lorre Munroe ([id number]) on 12/04/2020 9:00:16 AM   I have reviewed the labs performed to date as well as medications administered while in observation.  Recent changes in the last 24 hours include patient just seen by TTS this morning and feel she is safe for discharge home.  Patient was given her home blood pressure medicine as well as a dose of IV blood pressure medicine twice overnight to help with elevated blood pressures.  Now blood pressures are improved and patient feels ready go home.  Plan  Current plan is for discharge home per psychiatry recommendations.  She will follow-up with her PCP and outpatient psychiatry.  patient agrees with plan of care. Patient is not under full IVC at this time.   Clinical Impression: 1. Palpitations   2. Alcohol use   3. Depression, unspecified depression type     Disposition: Discharge  Condition: Good  I have discussed the results, Dx and Tx plan with the pt(& family if present). He/she/they expressed  understanding and agree(s) with the plan. Discharge instructions discussed at great length. Strict return precautions discussed and pt &/or family have verbalized understanding of the instructions. No further questions at time of discharge.    New Prescriptions   No medications on file    Follow Up: [name], [address] [place] [address] [place] DEPT [place] [id number] mc [place]         Delilah Mulgrew, Gwenyth Allegra, MD 12/05/20 1010

## 2020-12-05 NOTE — ED Notes (Signed)
Mulpus, John MD aware of high BP. No orders received.

## 2020-12-05 NOTE — ED Notes (Signed)
Patient provided with bedside commode per request

## 2020-12-05 NOTE — Discharge Instructions (Signed)
The psychiatry team felt that you were safe for discharge home on reassessment this morning.  Your blood pressures are elevated and we gave you both your home medicine as well as an extra blood pressure medicine overnight with the stress of being here.  Please take your home medicines and follow-up with your primary doctor to discuss further blood pressure management.  Please follow-up with outpatient psychiatry to help.  If any symptoms change or worsen acutely, please return to the nearest emergency room.  [facility]-will provide timely access to mental health services for children and adolescents (4-17) and adults presenting in a mental health crisis. The program is designed for those who need urgent Behavioral Health or Substance Use treatment and are not experiencing a medical crisis that would typically require an emergency room visit.    [place], [place] [address] Phone: [phone] [email or web address]   [facility] will also offer the following outpatient services: (Monday through Friday 8am-5pm)    Partial Hospitalization Program (PHP)  Substance Abuse Intensive Outpatient Program (SA-IOP)  Group Therapy  Medication Management  Peer Living Room   We also provide (24/7):    Assessments: Our mental health clinician and providers will conduct a focused mental health evaluation, assessing for immediate safety concerns and further mental health needs.   Referral: Our team will provide resources and help connect to community based mental health treatment, when indicated, including psychotherapy, psychiatry, and other specialized behavioral health or substance use disorder services (for those not already in treatment).   Transitional Care: Our team providers in person bridging and/or telphonic follow-up during the patient's transition to outpatient services.

## 2020-12-05 NOTE — ED Notes (Signed)
Patient provided with breakfast tray at this time.

## 2020-12-05 NOTE — ED Notes (Signed)
Telepsych to bedside for assessment at this time

## 2020-12-05 NOTE — Consult Note (Signed)
Telepsych Consultation   Reason for Consult:  Psych consult Referring Physician:  Lorre Munroe, MD Location of Patient: [facility] Location of Provider: [place] Department  Patient Identification: Ola Raap MRN:  [id number] Principal Diagnosis: Substance-induced disorder [facility]) Diagnosis:  Principal Problem:   Substance-induced disorder ([place]) Active Problems:   Cocaine use disorder ([place])  Total Time spent with patient: 20 minutes  Subjective:   Ruthell Feigenbaum is a 60 y.o. female patient admitted with complaints of possible substance ingestion from "drink".  Patient presents calm and cooperative, sitting in bed. When asked about why she came to the ED she responded, "Nothing really. Headaches". When asked if she was still feeling "bad" patient responded, "Nah. I'm okay. I was just depressed a little bit but I'm okay now".   Patient denies any suicidal or homicidal ideations, auditory or visual hallucinations, and does not appear to be responding to any external/internal stimuli at this time.   HPI:   Adlai Nieblas is a 60 year old female patient admitted with complaints of possible substance ingestion after a night of drinking. Patient does have a past history of cocaine use disorder; UDS+ cocaine and extensive medical history.   Past Psychiatric History:   -cocaine use disorder  Risk to Self:  no Risk to Others:  no Prior Inpatient Therapy:  no Prior Outpatient Therapy:  yes  Past Medical History:  Past Medical History:  Diagnosis Date  . Anemia   . CHF (congestive heart failure) ([place])   . Chronic bronchitis ([place])    "probably once/yr" (05/12/2013)  . Diastolic heart failure   . Dyslipidemia    pt denies this hx on 05/12/2013  . Exertional shortness of breath   . GERD (gastroesophageal reflux disease)   . Hypertension   . Hypokalemia   . Venous stasis ulcers ([facility])     Past Surgical History:  Procedure Laterality Date  . IR US GUIDE VASC ACCESS  RIGHT  02/27/2017  . IR VENOGRAM ADRENAL BI  02/27/2017  . IR VENOGRAM HEPATIC WO HEMODYNAMIC EVALUATION  02/27/2017  . IR VENOGRAM RENAL BI  02/27/2017  . IR VENOUS SAMPLING  02/27/2017  . IR VENOUS SAMPLING  02/27/2017  . VAGINAL HYSTERECTOMY  1990's   Family History:  Family History  Problem Relation Age of Onset  . Hypertension Mother   . Hypertension Sister   . Diabetes Sister   . Hypertension Brother   . Diabetes Sister    Family Psychiatric  History: not noted Social History:  Social History   Substance and Sexual Activity  Alcohol Use Yes  . Alcohol/week: 7.0 standard drinks  . Types: 7 Cans of beer per week   Comment: Beer once a week.     Social History   Substance and Sexual Activity  Drug Use No  . Types: Cocaine   Comment: 05/12/2013 "tried it a few days ago; didn't like it"    Social History   Socioeconomic History  . Marital status: Single    Spouse name: Not on file  . Number of children: Not on file  . Years of education: Not on file  . Highest education level: Not on file  Occupational History  . Occupation: disability  Tobacco Use  . Smoking status: Current Every Day Smoker    Packs/day: 1.00    Years: 36.00    Pack years: 36.00    Types: Cigarettes  . Smokeless tobacco: Never Used  . Tobacco comment: trying to cut back, 3/29 - only  had one cigarette today  Vaping Use  . Vaping Use: Never used  Substance and Sexual Activity  . Alcohol use: Yes    Alcohol/week: 7.0 standard drinks    Types: 7 Cans of beer per week    Comment: Beer once a week.  . Drug use: No    Types: Cocaine    Comment: 05/12/2013 "tried it a few days ago; didn't like it"  . Sexual activity: Not on file  Other Topics Concern  . Not on file  Social History Narrative  . Not on file   Social Determinants of Health   Financial Resource Strain: Low Risk   . Difficulty of Paying Living Expenses: Not hard at all  Food Insecurity: No Food Insecurity  . Worried About Paediatric nurse in the Last Year: Never true  . Ran Out of Food in the Last Year: Never true  Transportation Needs: No Transportation Needs  . Lack of Transportation (Medical): No  . Lack of Transportation (Non-Medical): No  Physical Activity: Inactive  . Days of Exercise per Week: 0 days  . Minutes of Exercise per Session: 0 min  Stress: No Stress Concern Present  . Feeling of Stress : Not at all  Social Connections: Not on file   Additional Social History:    Allergies:   Allergies  Allergen Reactions  . Lisinopril Cough    Labs:  Results for orders placed or performed during the hospital encounter of 12/04/20 (from the past 48 hour(s))  Brain natriuretic peptide     Status: None   Collection Time: 12/04/20  8:09 AM  Result Value Ref Range   B Natriuretic Peptide 80.0 0.0 - 100.0 pg/mL    Comment: Performed at [facility], [place] [address] [place], [place] [address]  Comprehensive metabolic panel     Status: Abnormal   Collection Time: 12/04/20  8:09 AM  Result Value Ref Range   Sodium 140 135 - 145 mmol/L   Potassium 3.3 (L) 3.5 - 5.1 mmol/L   Chloride 102 98 - 111 mmol/L   CO2 26 22 - 32 mmol/L   Glucose, Bld 77 70 - 99 mg/dL    Comment: Glucose reference range applies only to samples taken after fasting for at least 8 hours.   BUN 41 (H) 6 - 20 mg/dL   Creatinine, Ser 1.48 (H) 0.44 - 1.00 mg/dL   Calcium 8.5 (L) 8.9 - 10.3 mg/dL   Total Protein 7.8 6.5 - 8.1 g/dL   Albumin 3.9 3.5 - 5.0 g/dL   AST 28 15 - 41 U/L   ALT 20 0 - 44 U/L   Alkaline Phosphatase 63 38 - 126 U/L   Total Bilirubin 0.5 0.3 - 1.2 mg/dL   GFR, Estimated 40 (L) >60 mL/min    Comment: (NOTE) Calculated using the CKD-EPI Creatinine Equation (2021)    Anion gap 12 5 - 15    Comment: Performed at [facility], [place] [address], [place], [place] [address]  Troponin I (High Sensitivity)     Status: Abnormal   Collection Time: 12/04/20  8:09 AM  Result Value Ref  Range   Troponin I (High Sensitivity) 22 (H) <18 ng/L    Comment: (NOTE) Elevated high sensitivity troponin I (hsTnI) values and significant  changes across serial measurements may suggest ACS but many other  chronic and acute conditions are known to elevate hsTnI results.  Refer to the "Links" section for chest pain algorithms and  additional  guidance. Performed at [facility], [place] [address] [place], [place] [address]   CBC with Differential     Status: None   Collection Time: 12/04/20  8:09 AM  Result Value Ref Range   WBC 4.9 4.0 - 10.5 K/uL   RBC 4.04 3.87 - 5.11 MIL/uL   Hemoglobin 12.0 12.0 - 15.0 g/dL   HCT 37.6 36.0 - 46.0 %   MCV 93.1 80.0 - 100.0 fL   MCH 29.7 26.0 - 34.0 pg   MCHC 31.9 30.0 - 36.0 g/dL   RDW 12.3 11.5 - 15.5 %   Platelets 261 150 - 400 K/uL   nRBC 0.0 0.0 - 0.2 %   Neutrophils Relative % 53 %   Neutro Abs 2.6 1.7 - 7.7 K/uL   Lymphocytes Relative 37 %   Lymphs Abs 1.8 0.7 - 4.0 K/uL   Monocytes Relative 9 %   Monocytes Absolute 0.4 0.1 - 1.0 K/uL   Eosinophils Relative 1 %   Eosinophils Absolute 0.0 0.0 - 0.5 K/uL   Basophils Relative 0 %   Basophils Absolute 0.0 0.0 - 0.1 K/uL   Immature Granulocytes 0 %   Abs Immature Granulocytes 0.01 0.00 - 0.07 K/uL    Comment: Performed at [facility], [place] [address] [place], [place] [address]  Troponin I (High Sensitivity)     Status: Abnormal   Collection Time: 12/04/20 10:09 AM  Result Value Ref Range   Troponin I (High Sensitivity) 26 (H) <18 ng/L    Comment: (NOTE) Elevated high sensitivity troponin I (hsTnI) values and significant  changes across serial measurements may suggest ACS but many other  chronic and acute conditions are known to elevate hsTnI results.  Refer to the "Links" section for chest pain algorithms and additional  guidance. Performed at [facility], [place] [address] [place], [place] [address]   Resp Panel by RT-PCR  (Flu A&B, Covid) Nasopharyngeal Swab     Status: None   Collection Time: 12/04/20  1:41 PM   Specimen: Nasopharyngeal Swab; Nasopharyngeal(NP) swabs in vial transport medium  Result Value Ref Range   SARS Coronavirus 2 by RT PCR NEGATIVE NEGATIVE    Comment: (NOTE) SARS-CoV-2 target nucleic acids are NOT DETECTED.  The SARS-CoV-2 RNA is generally detectable in upper respiratory specimens during the acute phase of infection. The lowest concentration of SARS-CoV-2 viral copies this assay can detect is 138 copies/mL. A negative result does not preclude SARS-Cov-2 infection and should not be used as the sole basis for treatment or other patient management decisions. A negative result may occur with  improper specimen collection/handling, submission of specimen other than nasopharyngeal swab, presence of viral mutation(s) within the areas targeted by this assay, and inadequate number of viral copies(<138 copies/mL). A negative result must be combined with clinical observations, patient history, and epidemiological information. The expected result is Negative.  Fact Sheet for Patients:  EntrepreneurPulse.com.au  Fact Sheet for Healthcare Providers:  IncredibleEmployment.be  This test is no t yet approved or cleared by the [place] FDA and  has been authorized for detection and/or diagnosis of SARS-CoV-2 by FDA under an Emergency Use Authorization (EUA). This EUA will remain  in effect (meaning this test can be used) for the duration of the COVID-19 declaration under Section 564(b)(1) of the Act, 21 U.S.C.section 360bbb-3(b)(1), unless the authorization is terminated  or revoked sooner.       Influenza A by PCR NEGATIVE NEGATIVE   Influenza  B by PCR NEGATIVE NEGATIVE    Comment: (NOTE) The Xpert Xpress SARS-CoV-2/FLU/RSV plus assay is intended as an aid in the diagnosis of influenza from Nasopharyngeal swab specimens and should not be used as  a sole basis for treatment. Nasal washings and aspirates are unacceptable for Xpert Xpress SARS-CoV-2/FLU/RSV testing.  Fact Sheet for Patients: EntrepreneurPulse.com.au  Fact Sheet for Healthcare Providers: IncredibleEmployment.be  This test is not yet approved or cleared by the [place] FDA and has been authorized for detection and/or diagnosis of SARS-CoV-2 by FDA under an Emergency Use Authorization (EUA). This EUA will remain in effect (meaning this test can be used) for the duration of the COVID-19 declaration under Section 564(b)(1) of the Act, 21 U.S.C. section 360bbb-3(b)(1), unless the authorization is terminated or revoked.  Performed at [facility], [place] [address], [place], [place] [address]   Ethanol     Status: None   Collection Time: 12/04/20  5:47 PM  Result Value Ref Range   Alcohol, Ethyl (B) <10 <10 mg/dL    Comment: (NOTE) Lowest detectable limit for serum alcohol is 10 mg/dL.  For medical purposes only. Performed at [facility], [place] [address], [place], [place] [address]   Salicylate level     Status: Abnormal   Collection Time: 12/04/20  5:47 PM  Result Value Ref Range   Salicylate Lvl <2.8 (L) 7.0 - 30.0 mg/dL    Comment: Performed at [facility], [place] [address] [place], [place] [address]  Acetaminophen level     Status: Abnormal   Collection Time: 12/04/20  5:47 PM  Result Value Ref Range   Acetaminophen (Tylenol), Serum <10 (L) 10 - 30 ug/mL    Comment: (NOTE) Therapeutic concentrations vary significantly. A range of 10-30 ug/mL  may be an effective concentration for many patients. However, some  are best treated at concentrations outside of this range. Acetaminophen concentrations >150 ug/mL at 4 hours after ingestion  and >50 ug/mL at 12 hours after ingestion are often associated with  toxic reactions.  Performed at [facility], [place] [address], [place], [place] [address]   Urine rapid drug screen (hosp performed)     Status: Abnormal   Collection Time: 12/05/20  5:36 AM  Result Value Ref Range   Opiates NONE DETECTED NONE DETECTED   Cocaine POSITIVE (A) NONE DETECTED   Benzodiazepines NONE DETECTED NONE DETECTED   Amphetamines NONE DETECTED NONE DETECTED   Tetrahydrocannabinol NONE DETECTED NONE DETECTED   Barbiturates NONE DETECTED NONE DETECTED    Comment: (NOTE) DRUG SCREEN FOR MEDICAL PURPOSES ONLY.  IF CONFIRMATION IS NEEDED FOR ANY PURPOSE, NOTIFY LAB WITHIN 5 DAYS.  LOWEST DETECTABLE LIMITS FOR URINE DRUG SCREEN Drug Class                     Cutoff (ng/mL) Amphetamine and metabolites    1000 Barbiturate and metabolites    200 Benzodiazepine                 725 Tricyclics and metabolites     300 Opiates and metabolites        300 Cocaine and metabolites        300 THC                            50 Performed at [facility], [place] [address], [place], [place] [address]   CBG  monitoring, ED     Status: None   Collection Time: 12/05/20  7:55 AM  Result Value Ref Range   Glucose-Capillary 93 70 - 99 mg/dL    Comment: Glucose reference range applies only to samples taken after fasting for at least 8 hours.    Medications:  Current Facility-Administered Medications  Medication Dose Route Frequency Provider Last Rate Last Admin  . acetaminophen (TYLENOL) tablet 650 mg  650 mg Oral Q4H PRN Arnaldo Natal, MD      . alum & mag hydroxide-simeth (MAALOX/MYLANTA) 200-200-20 MG/5ML suspension 30 mL  30 mL Oral Q6H PRN Arnaldo Natal, MD      . amLODipine (NORVASC) tablet 10 mg  10 mg Oral Daily Arnaldo Natal, MD      . aspirin EC tablet 81 mg  81 mg Oral Daily Arnaldo Natal, MD   81 mg at 12/04/20 1539  . dapagliflozin propanediol (FARXIGA) tablet 10 mg  10 mg Oral QAC breakfast Arnaldo Natal, MD   10 mg at 12/05/20 1025  . hydrochlorothiazide (HYDRODIURIL) tablet 25 mg   25 mg Oral Daily Arnaldo Natal, MD   25 mg at 12/04/20 1539  . LORazepam (ATIVAN) injection 0-4 mg  0-4 mg Intravenous Q6H Arnaldo Natal, MD   2 mg at 12/04/20 1538   Or  . LORazepam (ATIVAN) tablet 0-4 mg  0-4 mg Oral Q6H Arnaldo Natal, MD      . Derrill Memo ON 12/06/2020] LORazepam (ATIVAN) injection 0-4 mg  0-4 mg Intravenous Q12H Arnaldo Natal, MD       Or  . Derrill Memo ON 12/06/2020] LORazepam (ATIVAN) tablet 0-4 mg  0-4 mg Oral Q12H Arnaldo Natal, MD      . magnesium oxide (MAG-OX) tablet 400 mg  400 mg Oral QAC supper Arnaldo Natal, MD   400 mg at 12/04/20 1538  . nicotine (NICODERM CQ - dosed in mg/24 hours) patch 21 mg  21 mg Transdermal Daily Arnaldo Natal, MD      . ondansetron [facility]) tablet 4 mg  4 mg Oral Q8H PRN Arnaldo Natal, MD      . thiamine tablet 100 mg  100 mg Oral Daily Arnaldo Natal, MD   100 mg at 12/04/20 1539   Or  . thiamine (B-1) injection 100 mg  100 mg Intravenous Daily Arnaldo Natal, MD       Current Outpatient Medications  Medication Sig Dispense Refill  . acetaminophen (TYLENOL) 325 MG tablet Take 650 mg by mouth every 6 (six) hours as needed for mild pain or headache.    Marland Kitchen amLODipine (NORVASC) 10 MG tablet Take 1 tablet (10 mg total) by mouth daily. 90 tablet 3  . aspirin EC 81 MG EC tablet Take 1 tablet (81 mg total) by mouth daily. 30 tablet 3  . dapagliflozin propanediol (FARXIGA) 10 MG TABS tablet Take 1 tablet (10 mg total) by mouth daily before breakfast. 30 tablet 2  . hydrochlorothiazide (HYDRODIURIL) 25 MG tablet Take 1 tablet (25 mg total) by mouth daily. 90 tablet 1  . Magnesium 250 MG TABS Take 1 tablet by mouth daily with evening meal 90 tablet 1  . atorvastatin (LIPITOR) 40 MG tablet Take 1 tablet (40 mg total) by mouth daily. (Patient not taking: Reported on 12/04/2020) 90 tablet 1  . losartan (COZAAR) 50 MG tablet Take 1 tablet (50 mg total) by mouth daily. 90 tablet 1   Musculoskeletal: Strength &  Muscle Tone: within normal limits Gait &  Station: normal Patient leans: N/A  Psychiatric Specialty Exam: Physical Exam Vitals and nursing note reviewed.  Psychiatric:        Attention and Perception: Attention and perception normal.        Mood and Affect: Mood and affect normal.        Speech: Speech normal.        Behavior: Behavior normal. Behavior is cooperative.        Thought Content: Thought content normal.        Cognition and Memory: Cognition and memory normal.        Judgment: Judgment normal.     Review of Systems  Psychiatric/Behavioral: Negative.     Blood pressure (!) 202/129, pulse 76, temperature 98.9 F (37.2 C), temperature source Oral, resp. rate 16, height 5\' 7"  (1.702 m), weight 86.2 kg, SpO2 97 %.Body mass index is 29.76 kg/m.  General Appearance: Casual  Eye Contact:  Fair  Speech:  Clear and Coherent  Volume:  Normal  Mood:  Euthymic  Affect:  Congruent  Thought Process:  Coherent  Orientation:  Full (Time, Place, and Person)  Thought Content:  WDL  Suicidal Thoughts:  No  Homicidal Thoughts:  No  Memory:  Immediate;   Fair Recent;   Fair  Judgement:  Fair  Insight:  Fair  Psychomotor Activity:  Normal  Concentration:  Concentration: Fair and Attention Span: Fair  Recall:  [employer] of Knowledge:  Fair  Language:  Fair  Akathisia:  NA  Handed:    AIMS (if indicated):     Assets:  Communication Skills Resilience Social Support  ADL's:  Intact  Cognition:  WNL  Sleep:      Treatment Plan Summary: Plan discharge patient home with outpatient resources for individual follow-up.   Disposition: No evidence of imminent risk to self or others at present.   Patient does not meet criteria for psychiatric inpatient admission. Supportive therapy provided about ongoing stressors. Discussed crisis plan, support from social network, calling 911, coming to the Emergency Department, and calling Suicide Hotline.  This service was provided via telemedicine using a 2-way, interactive audio  and video technology.  Names of all persons participating in this telemedicine service and their role in this encounter. Name: Oneida Alar Role: PMHNP  Name: Sharma Covert Role: Attending MD  Name: Kendal Hymen Role: patient  Name:  Role:     Inda Merlin, NP 12/05/2020 9:25 AM

## 2020-12-05 NOTE — ED Notes (Signed)
Molpus, John MD made aware of pt's high BP. Verbal order from MD to give 10mg  of Norvasc now and at later scheduled time of 1000.

## 2020-12-05 NOTE — Progress Notes (Signed)
CSW provided mental health resource through [facility] for outpatient services listed below:   [facility] provide timely access to mental health services for children and adolescents (4-17) and adults presenting in a mental health crisis. The program is designed for those who need urgent Behavioral Health or Substance Use treatment and are not experiencing a medical crisis that would typically require an emergency room visit.    [place], [place] [address] Phone: [phone] [email or web address]   [facility] will also offer the following outpatient services: (Monday through Friday 8am-5pm)    Partial Hospitalization Program (PHP)  Substance Abuse Intensive Outpatient Program (SA-IOP)  Group Therapy  Medication Management  Peer Living Room   We also provide (24/7):    Assessments: Our mental health clinician and providers will conduct a focused mental health evaluation, assessing for immediate safety concerns and further mental health needs.   Referral: Our team will provide resources and help connect to community based mental health treatment, when indicated, including psychotherapy, psychiatry, and other specialized behavioral health or substance use disorder services (for those not already in treatment).   Transitional Care: Our team providers in person bridging and/or telphonic follow-up during the patient's transition to outpatient services.      Glennie [place], MSW, [place], LCAS-A Phone: [phone] Disposition/TOC

## 2020-12-09 ENCOUNTER — Other Ambulatory Visit: Payer: Self-pay

## 2020-12-09 ENCOUNTER — Ambulatory Visit (INDEPENDENT_AMBULATORY_CARE_PROVIDER_SITE_OTHER): Payer: Medicare Other | Admitting: Nurse Practitioner

## 2020-12-09 VITALS — BP 138/80 | HR 83 | Temp 98.4°F | Ht 67.0 in | Wt 192.2 lb

## 2020-12-09 DIAGNOSIS — I129 Hypertensive chronic kidney disease with stage 1 through stage 4 chronic kidney disease, or unspecified chronic kidney disease: Secondary | ICD-10-CM

## 2020-12-09 DIAGNOSIS — R002 Palpitations: Secondary | ICD-10-CM

## 2020-12-09 DIAGNOSIS — N1831 Chronic kidney disease, stage 3a: Secondary | ICD-10-CM | POA: Diagnosis not present

## 2020-12-09 DIAGNOSIS — F32A Depression, unspecified: Secondary | ICD-10-CM | POA: Diagnosis not present

## 2020-12-09 DIAGNOSIS — Z09 Encounter for follow-up examination after completed treatment for conditions other than malignant neoplasm: Secondary | ICD-10-CM

## 2020-12-09 NOTE — Progress Notes (Signed)
I,[name],acting as a Education administrator for [employer], NP.,have documented all relevant documentation on the behalf of [employer], NP,as directed by  Bary Castilla, NP while in the presence of Bary Castilla, NP.  This visit occurred during the SARS-CoV-2 public health emergency.  Safety protocols were in place, including screening questions prior to the visit, additional usage of staff PPE, and extensive cleaning of exam room while observing appropriate contact time as indicated for disinfecting solutions.  Subjective:     Patient ID: [name] , female    DOB: 1961/04/15 , 60 y.o.   MRN: [id number]   Chief Complaint  Patient presents with  . Hospitalization Follow-up    HPI  Patient is here for hospital follow up. She has no concerns at this time. She is taking all of her medications. Her BP is 138/80. She went to the hospital because her BP was very high and was feeling like she had palpitations. She has a kidney doctor appt next week. Cardiologist appt in June. She lives with her best friend. She got some therapy at the hospital for mental health but she feels like she doesn't need it because she feels better. She said she has stopped drinking since the hospitalization.   She has 11 grandkids. She has 5 kids. She wants to try to spend more time with them. Since then she has stopped farsiga 10 mg.      Past Medical History:  Diagnosis Date  . Anemia   . CHF (congestive heart failure) ([place])   . Chronic bronchitis ([place])    "probably once/yr" (05/12/2013)  . Diastolic heart failure   . Dyslipidemia    pt denies this hx on 05/12/2013  . Exertional shortness of breath   . GERD (gastroesophageal reflux disease)   . Hypertension   . Hypokalemia   . Venous stasis ulcers ([facility])      Family History  Problem Relation Age of Onset  . Hypertension Mother   . Hypertension Sister   . Diabetes Sister   . Hypertension Brother   . Diabetes Sister      Current  Outpatient Medications:  .  acetaminophen (TYLENOL) 325 MG tablet, Take 650 mg by mouth every 6 (six) hours as needed for mild pain or headache., Disp: , Rfl:  .  amLODipine (NORVASC) 10 MG tablet, Take 1 tablet (10 mg total) by mouth daily., Disp: 90 tablet, Rfl: 3 .  aspirin EC 81 MG EC tablet, Take 1 tablet (81 mg total) by mouth daily., Disp: 30 tablet, Rfl: 3 .  atorvastatin (LIPITOR) 40 MG tablet, Take 1 tablet (40 mg total) by mouth daily. (Patient not taking: Reported on 12/04/2020), Disp: 90 tablet, Rfl: 1 .  dapagliflozin propanediol (FARXIGA) 10 MG TABS tablet, Take 1 tablet (10 mg total) by mouth daily before breakfast., Disp: 30 tablet, Rfl: 2 .  hydrochlorothiazide (HYDRODIURIL) 25 MG tablet, Take 1 tablet (25 mg total) by mouth daily., Disp: 90 tablet, Rfl: 1 .  losartan (COZAAR) 50 MG tablet, Take 1 tablet (50 mg total) by mouth daily., Disp: 90 tablet, Rfl: 1 .  Magnesium 250 MG TABS, Take 1 tablet by mouth daily with evening meal, Disp: 90 tablet, Rfl: 1   Allergies  Allergen Reactions  . Lisinopril Cough     Review of Systems  Constitutional: Negative.  Negative for chills and fever.  HENT: Negative for congestion and ear pain.   Respiratory: Negative.  Negative for choking, chest tightness, shortness of breath and wheezing.  Cardiovascular: Negative.  Negative for chest pain and palpitations.  Gastrointestinal: Negative.   Endocrine: Negative for cold intolerance, polydipsia, polyphagia and polyuria.  Musculoskeletal: Negative for arthralgias, gait problem and myalgias.  Neurological: Negative.  Negative for dizziness, weakness and numbness.     Today's Vitals   12/09/20 1009  BP: 138/80  Pulse: 83  Temp: 98.4 F (36.9 C)  TempSrc: Oral  Weight: 192 lb 3.2 oz (87.2 kg)  Height: 5' 7"  (1.702 m)   Body mass index is 30.1 kg/m.   Objective:  Physical Exam Constitutional:      Appearance: Normal appearance.  HENT:     Head: Normocephalic and atraumatic.   Cardiovascular:     Rate and Rhythm: Normal rate and regular rhythm.     Pulses: Normal pulses.     Heart sounds: No murmur heard.   Pulmonary:     Effort: Pulmonary effort is normal. No respiratory distress.     Breath sounds: Normal breath sounds. No wheezing.  Skin:    General: Skin is warm and dry.     Capillary Refill: Capillary refill takes less than 2 seconds.  Neurological:     General: No focal deficit present.     Mental Status: She is alert and oriented to person, place, and time.  Psychiatric:        Mood and Affect: Mood normal.        Behavior: Behavior normal.        Thought Content: Thought content normal.        Judgment: Judgment normal.         Assessment And Plan:     1. Hospital discharge follow-up -Patient is here for a follow up for the hospital. She is doing better since her discharge. She has an appointment with the cardiologist and nephrologist soon. She is taking all of her medication as prescribed and maintaining a good control of her blood pressure. She refuses therapy/couseling at this time for her alcohol use as she stated she has not drank since her discharge from the hospital. She is no longer taking Farsiga 10 mg as she ran out of it. She was very concerned that her friends had put drugs in her alcohol which made her go to the hospital. She refuses any further services at this time. No changes to her medication was made at this visit.   2. Stage 3a chronic kidney disease ([place]) -Her GFR  On 12/04/20 was 40. Will recheck today. -Advised her to follow up with the kidney doctor.  - [id number]    Follow up: advised patient to call office with any concerns or questions. Follow up scheduled in June.   Patient was given opportunity to ask questions. Patient verbalized understanding of the plan and was able to repeat key elements of the plan. All questions were answered to their satisfaction.  Bary Castilla, DNP   I, Bary Castilla, DNP  have  reviewed all documentation for this visit. The documentation on 12/09/20 for the exam, diagnosis, procedures, and orders are all accurate and complete.   IF YOU HAVE BEEN REFERRED TO A SPECIALIST, IT MAY TAKE 1-2 WEEKS TO SCHEDULE/PROCESS THE REFERRAL. IF YOU HAVE NOT HEARD FROM US/SPECIALIST IN TWO WEEKS, PLEASE GIVE [place] A CALL AT [phone].   THE PATIENT IS ENCOURAGED TO PRACTICE SOCIAL DISTANCING DUE TO THE COVID-19 PANDEMIC.

## 2020-12-09 NOTE — Patient Instructions (Signed)
[email or web address]">  Depression Screening Depression screening is a tool that your health care provider can use to learn if you have symptoms of depression. Depression is a common condition with many symptoms that are also often found in other conditions. Depression is treatable, but it must first be diagnosed. You may not know that certain feelings, thoughts, and behaviors that you are having can be symptoms of depression. Taking a depression screening test can help you and your health care provider decide if you need more assessment, or if you should be referred to a mental health care provider. What are the screening tests?  You may have a physical exam to see if another condition is affecting your mental health. You may have a blood or urine sample taken during the physical exam.  You may be interviewed using a screening tool that was developed from research, such as one of these: ? Patient Health Questionnaire (PHQ). This is a set of either 2 or 9 questions. A health care provider who has been trained to score this screening test uses a guide to assess if your symptoms suggest that you may have depression. ? Hamilton Depression Rating Scale (HAM-D). This is a set of either 17 or 24 questions. You may be asked to take it again during or after your treatment, to see if your depression has gotten better. ? Beck Depression Inventory (BDI). This is a set of 21 multiple choice questions. Your health care provider scores your answers to assess:  Your level of depression, ranging from mild to severe.  Your response to treatment.  Your health care provider may talk with you about your daily activities, such as eating, sleeping, work, and recreation, and ask if you have had any changes in activity.  Your health care provider may ask you to see a mental health specialist, such as a psychiatrist or psychologist, for more  evaluation. Who should be screened for depression?  All adults, including adults with a family history of a mental health disorder.  Adolescents who are 89-77 years old.  People who are recovering from a myocardial infarction (MI).  Pregnant women, or women who have given birth.  People who have a long-term (chronic) illness.  Anyone who has been diagnosed with another type of a mental health disorder.  Anyone who has symptoms that could show depression.   What do my results mean? Your health care provider will review the results of your depression screening, physical exam, and lab tests. Positive screens suggest that you may have depression. Screening is the first step in getting the care that you may need. It is up to you to get your screening results. Ask your health care provider, or the department that is doing your screening tests, when your results will be ready. Talk with your health care provider about your results and diagnosis. A diagnosis of depression is made using the Diagnostic and Statistical Manual of Mental Disorders (DSM-V). This is a book that lists the number and type of symptoms that must be present for a health care provider to give a specific diagnosis.  Your health care provider may work with you to treat your symptoms of depression, or your health care provider may help you find a mental health provider who can assess, diagnose, and treat your depression. Get help right away if:  You have thoughts about hurting yourself or others. If you ever feel like you may hurt yourself or others, or have thoughts about taking your own life, get help  right away. You can go to your nearest emergency department or call:  Your local emergency services (911 in the [place]  A suicide crisis helpline, such as the [facility] at (779)432-9194. This is open 24 hours a day. Summary  Depression screening is the first step in getting the help that you may  need.  If your screening test shows symptoms of depression (is positive), your health care provider may ask you to see a mental health provider.  Anyone who is age 33 or older should be screened for depression. This information is not intended to replace advice given to you by your health care provider. Make sure you discuss any questions you have with your health care provider. Document Revised: 01/29/2020 Document Reviewed: 01/29/2020 Elsevier Patient Education  [place].

## 2020-12-10 LAB — CMP14+EGFR
ALT: 14 IU/L (ref 0–32)
AST: 12 IU/L (ref 0–40)
Albumin/Globulin Ratio: 1.3 (ref 1.2–2.2)
Albumin: 4.2 g/dL (ref 3.8–4.9)
Alkaline Phosphatase: 70 IU/L (ref 44–121)
BUN/Creatinine Ratio: 23 (ref 12–28)
BUN: 39 mg/dL — ABNORMAL HIGH (ref 8–27)
Bilirubin Total: 0.6 mg/dL (ref 0.0–1.2)
CO2: 25 mmol/L (ref 20–29)
Calcium: 9.6 mg/dL (ref 8.7–10.3)
Chloride: 99 mmol/L (ref 96–106)
Creatinine, Ser: 1.73 mg/dL — ABNORMAL HIGH (ref 0.57–1.00)
Globulin, Total: 3.2 g/dL (ref 1.5–4.5)
Glucose: 63 mg/dL — ABNORMAL LOW (ref 65–99)
Potassium: 3.4 mmol/L — ABNORMAL LOW (ref 3.5–5.2)
Sodium: 143 mmol/L (ref 134–144)
Total Protein: 7.4 g/dL (ref 6.0–8.5)
eGFR: 33 mL/min/{1.73_m2} — ABNORMAL LOW (ref >59)

## 2020-12-15 ENCOUNTER — Telehealth: Payer: Medicare Other

## 2020-12-15 ENCOUNTER — Telehealth: Payer: Self-pay

## 2020-12-15 NOTE — Telephone Encounter (Signed)
  Chronic Care Management   Outreach Note  12/15/2020 Name: [name] MRN: [id number] DOB: [birth year]  Referred by: [name], FNP Reason for referral : Chronic Care Management (Unsuccessful call)   An unsuccessful telephone outreach was attempted today. The patient was referred to the case management team for assistance with care management and care coordination.   Follow Up Plan: A HIPAA compliant phone message was left for the patient providing contact information and requesting a return call.  The care management team will reach out to the patient again over the next 30 days.   Daneen Schick, BSW, CDP Social Worker, Certified Dementia Practitioner [facility] [phone]

## 2020-12-20 ENCOUNTER — Telehealth: Payer: Self-pay

## 2020-12-20 ENCOUNTER — Telehealth: Payer: Medicare Other

## 2020-12-20 NOTE — Telephone Encounter (Signed)
  Chronic Care Management   Outreach Note  12/20/2020 Name: [name] MRN: [id number] DOB: 05-15-1961  Referred by: [name], FNP Reason for referral : Chronic Care Management (Initial CCM RN CM Call Attempt )   An unsuccessful telephone outreach was attempted today. The patient was referred to the case management team for assistance with care management and care coordination.   Follow Up Plan: A HIPAA compliant phone message was left for the patient providing contact information and requesting a return call. A member of the CCM team will outreach to patient again over the next 30-45 days.   Barb Merino, RN, BSN, CCM Care Management Coordinator [facility]  Direct Phone: [phone]

## 2020-12-31 ENCOUNTER — Other Ambulatory Visit: Payer: Self-pay | Admitting: Nephrology

## 2020-12-31 DIAGNOSIS — N1832 Chronic kidney disease, stage 3b: Secondary | ICD-10-CM

## 2021-01-10 ENCOUNTER — Telehealth: Payer: Medicare Other

## 2021-01-10 ENCOUNTER — Telehealth: Payer: Self-pay

## 2021-01-10 NOTE — Progress Notes (Signed)
Advanced Hypertension Clinic Initial Assessment:    Date:  01/11/2021   ID:  [name], DOB 08/06/1961, MRN [id number]  PCP:  [name], FNP  Cardiologist:  Skeet Latch, MD  Nephrologist: Dr. Candiss Norse, [facility]  Referring MD: [name], FNP   CC: Hypertension  History of Present Illness:    [name] is a 60 y.o. female with a hx of anemia, chronic diastolic heart failure, chronic bronchitis, GERD, hypertension, hypokalemia, venous stasis ulcers, and tobacco use, here to establish care in the hypertension clinic 01/11/2021.  She was seen in the ED 11/2020 with a blood pressure of 213/88. She had been drinking heavily and was experiencing palpitations. She previously had an echo 08/2018 with LVEF 60-65% and grade 1 diastolic dysfunction. She had severe LVH. When she last saw her PCP 10/2020, her blood pressure was uncontrolled and she was referred to cardiology.  Today, she is feeling fatigued, and believes this is due to Norvasc. She has had hypertension since she was in her 1's. Her blood pressure may have been well controlled at one point previously. She does check her blood pressure at home and it has been mostly elevated but she doesn't remember specific numbers. Lately, her heart rate has been racing intermittently for 30 minutes at a time. She last noticed this two days ago, and took some aspirin. Also, she has some LE edema that may dissipate with walking. For exercise, she walks every day, including going to the store and back. In her diet, she usually avoids salt and pork. She mostly eats home-cooked meals. She confirms snoring and feels rested when waking up. She has not participated in a sleep study.  She denies any chest pain, shortness of breath, palpitations, or exertional symptoms. No headaches, lightheadedness, or syncope to report. Also has no lower extremity edema, orthopnea or PND.  In 2018 she was noted to have an adrenal adenoma.  Labs at that time  were consistent with hyperaldosteronism.  She never had a subsequent follow-up.  She recently established with her Nephrologist, Dr. Candiss Norse with [facility] .  She had some labs drawn last week and was started on spironolactone, though she has not yet picked up the prescription.  Her mother had hypertension and kidney failure. Most of her family has a history of hypertension. Her father had a heart attack.   Previous antihypertensives: Lisinopril   Past Medical History:  Diagnosis Date  . Adrenal adenoma 01/11/2021  . Anemia   . CHF (congestive heart failure) ([place])   . Chronic bronchitis ([place])    "probably once/yr" (05/12/2013)  . Diastolic heart failure   . Dyslipidemia    pt denies this hx on 05/12/2013  . Exertional shortness of breath   . GERD (gastroesophageal reflux disease)   . Hypertension   . Hypokalemia   . Left ventricular hypertrophy 01/11/2021  . Venous stasis ulcers (HCC)     Past Surgical History:  Procedure Laterality Date  . IR US GUIDE VASC ACCESS RIGHT  02/27/2017  . IR VENOGRAM ADRENAL BI  02/27/2017  . IR VENOGRAM HEPATIC WO HEMODYNAMIC EVALUATION  02/27/2017  . IR VENOGRAM RENAL BI  02/27/2017  . IR VENOUS SAMPLING  02/27/2017  . IR VENOUS SAMPLING  02/27/2017  . VAGINAL HYSTERECTOMY  1990's    Current Medications: Current Meds  Medication Sig  . acetaminophen (TYLENOL) 325 MG tablet Take 650 mg by mouth every 6 (six) hours as needed for mild pain or headache.  Marland Kitchen amLODipine (NORVASC)  10 MG tablet Take 1 tablet (10 mg total) by mouth daily.  Marland Kitchen atorvastatin (LIPITOR) 40 MG tablet Take 1 tablet (40 mg total) by mouth daily.  . hydrochlorothiazide (HYDRODIURIL) 25 MG tablet Take 1 tablet (25 mg total) by mouth daily.  Marland Kitchen losartan (COZAAR) 50 MG tablet Take 1 tablet (50 mg total) by mouth daily.  Marland Kitchen spironolactone (ALDACTONE) 25 MG tablet Take 25 mg by mouth daily.     Allergies:   Lisinopril   Social History   Socioeconomic History  . Marital status:  Single    Spouse name: Not on file  . Number of children: Not on file  . Years of education: Not on file  . Highest education level: Not on file  Occupational History  . Occupation: disability  Tobacco Use  . Smoking status: Current Every Day Smoker    Packs/day: 1.00    Years: 36.00    Pack years: 36.00    Types: Cigarettes  . Smokeless tobacco: Never Used  . Tobacco comment: trying to cut back, 3/29 - only had one cigarette today  Vaping Use  . Vaping Use: Never used  Substance and Sexual Activity  . Alcohol use: Yes    Alcohol/week: 7.0 standard drinks    Types: 7 Cans of beer per week    Comment: Beer once a week.  . Drug use: No    Types: Cocaine    Comment: 05/12/2013 "tried it a few days ago; didn't like it"  . Sexual activity: Not on file  Other Topics Concern  . Not on file  Social History Narrative  . Not on file   Social Determinants of Health   Financial Resource Strain: Low Risk   . Difficulty of Paying Living Expenses: Not hard at all  Food Insecurity: No Food Insecurity  . Worried About Charity fundraiser in the Last Year: Never true  . Ran Out of Food in the Last Year: Never true  Transportation Needs: No Transportation Needs  . Lack of Transportation (Medical): No  . Lack of Transportation (Non-Medical): No  Physical Activity: Inactive  . Days of Exercise per Week: 0 days  . Minutes of Exercise per Session: 0 min  Stress: No Stress Concern Present  . Feeling of Stress : Not at all  Social Connections: Not on file     Family History: The patient's family history includes Diabetes in her sister and sister; Heart attack in her father; Hypertension in her brother, mother, and sister; Kidney failure in her mother.  ROS:   Please see the history of present illness.    (+) Fatigue (+) LE edema (+) Snores All other systems reviewed and are negative.  EKGs/Labs/Other Studies Reviewed:    EKG:   01/11/2021: EKG is not ordered today.  Recent  Labs: 12/04/2020: B Natriuretic Peptide 80.0; Hemoglobin 12.0; Platelets 261 12/09/2020: ALT 14; BUN 39; Creatinine, Ser 1.73; Potassium 3.4; Sodium 143   Recent Lipid Panel    Component Value Date/Time   CHOL 189 10/18/2015 1553   TRIG 212 (H) 10/18/2015 1553   HDL 38 (L) 10/18/2015 1553   CHOLHDL 5.0 (H) 10/18/2015 1553   CHOLHDL 4.9 05/13/2013 0437   VLDL 20 05/13/2013 0437   LDLCALC 109 (H) 10/18/2015 1553   US Arterial Seg Multiple LE 11/05/2020: Normal exam. No evidence of hemodynamically significant peripheral arterial disease.  Echo TTE 09/06/2018: - Left ventricle: The cavity size was normal. Wall thickness was  increased in a pattern of severe  LVH. Systolic function was  normal. The estimated ejection fraction was in the range of 60%  to 65%. Wall motion was normal; there were no regional wall  motion abnormalities. Doppler parameters are consistent with  abnormal left ventricular relaxation (grade 1 diastolic  dysfunction).  - Aortic valve: There was no stenosis. There was trivial  regurgitation.  - Mitral valve: There was trivial regurgitation.  - Left atrium: The atrium was moderately dilated.  - Right ventricle: The cavity size was normal. Systolic function  was normal.  - Right atrium: The atrium was mildly dilated.  - Pulmonary arteries: No complete TR doppler jet so unable to  estimate PA systolic pressure.  - Inferior vena cava: The vessel was normal in size. The  respirophasic diameter changes were in the normal range (>= 50%),  consistent with normal central venous pressure.   Impressions:  - Normal LV size with severe LV hypertrophy. EF 60-65%. Moderate  LAE. Normal RV size and systolic function. No significant  valvular abnormalities. Cardiac amyloidosis is a consideration.    Physical Exam:   VS:  BP (!) 172/90 (BP Location: Left Arm)   Pulse 91   Ht 5\' 8"  (1.727 m)   Wt 192 lb 9.6 oz (87.4 kg)   SpO2 100%   BMI 29.28  kg/m  , BMI Body mass index is 29.28 kg/m. GENERAL:  Well appearing HEENT: Pupils equal round and reactive, fundi not visualized, oral mucosa unremarkable NECK:  No jugular venous distention, waveform within normal limits, carotid upstroke brisk and symmetric, no bruits, no thyromegaly LYMPHATICS:  No cervical adenopathy LUNGS:  Clear to auscultation bilaterally HEART:  RRR.  PMI not displaced or sustained,S1 and S2 within normal limits, no S3, no S4, no clicks, no rubs, no murmurs ABD:  Flat, positive bowel sounds normal in frequency in pitch, no bruits, no rebound, no guarding, no midline pulsatile mass, no hepatomegaly, no splenomegaly EXT:  2 plus pulses throughout, no edema, no cyanosis no clubbing SKIN:  No rashes no nodules NEURO:  Cranial nerves II through XII grossly intact, motor grossly intact throughout PSYCH:  Cognitively intact, oriented to person place and time   ASSESSMENT/PLAN:    Adrenal adenoma Adrenal adenoma noted on the right in 2018.  Labs at that time were consistent with hyperaldosteronism.  However, she has not had any further follow-up.  She was just seen by nephrology last week and was started on spironolactone.  She has not yet started taking it.  We will check renal and and aldosterone levels today.  Of note, she is taking losartan which may alter the results.  She is already scheduled to get a basic metabolic panel rechecked with him in a week.  He also told her that she needs her adenoma removed.  I completely concur.  Recommend that she see Dr. Cena Benton at [facility].  I will touch base with her nephrologist as well.   Tobacco abuse Smoking cessation advised.  Cocaine use disorder (HCC) Cessation advised.  Recommend talking with our Care Guide and Social Work.  Severe uncontrolled hypertension Blood pressure is poorly controlled.  She has polysubstance abuse.  She also has an adrenal adenoma with hyperaldosteronism.  Recommend that she be seen by an endocrine  surgeon.  I had good experiences with Dr. Barbera Setters at [facility].  She mentioned that her nephrologist may be sending her to [facility].  Will discuss with him.  Continue amlodipine, hydrochlorothiazide, and losartan.  Agree with adding spironolactone.  Check renin  and aldosterone today.  She already is scheduled to get a basic metabolic panel with her nephrologist.  Given that she already has another specialist managing her blood pressure, we will defer.  Left ventricular hypertrophy Severe LVH previously noted on echo.  She has some swelling on exam but no orthopnea or PND.  Her JVP is not elevated.  She notes that her swelling does improve with elevation of her legs.  Of note, her EKG does not show high voltage.  This constellation of findings is concerning for cardiac amyloidosis.  We will get a cardiac MRI to better assess.  It could all be related to hypertensive cardiomyopathy.  Hyperlipemia Continue atorvastatin.  Hypertension due to endocrine disorder Hyperaldosteronism.   Referral for endocrine surgeon.   Screening for Secondary Hypertension:  Causes 01/11/2021  Drugs/Herbals Screened  Sleep Apnea Screened     - Comments snores  Thyroid Disease Screened  Hyperaldosteronism Screened     - Comments 2018    Relevant Labs/Studies: Basic Labs Latest Ref Rng & Units 12/09/2020 12/04/2020 10/11/2020  Sodium 134 - 144 mmol/L 143 140 140  Potassium 3.5 - 5.2 mmol/L 3.4(L) 3.3(L) 3.8  Creatinine 0.57 - 1.00 mg/dL 1.73(H) 1.48(H) 1.39(H)    Thyroid  Latest Ref Rng & Units 02/27/2019 12/28/2010  TSH 0.450 - 4.500 uIU/mL 0.665 1.047    Renin/Aldosterone  Latest Ref Rng & Units 02/27/2017 02/27/2017 02/27/2017 02/27/2017 02/27/2017 02/27/2017 02/27/2017  Aldosterone 0.0 - 30.0 ng/dL 23.7 15.9 52.8(H) <1.0 5,161.8(H) 1.4 497.1(H)  Renin 0.167 - 5.380 ng/mL/hr - - - - - - -  Aldos/Renin Ratio 0.0 - 30.0 - - - - - - -       Cortisol Latest Ref Rng & Units 02/27/2017 02/27/2017 02/27/2017 02/27/2017 02/27/2017  02/27/2017 02/27/2017  Cortisol  ug/dL 16.9 19.5 19.9 14.6 >100.0 5.0 23.6         Disposition:    FU with MD in 6 months.   Medication Adjustments/Labs and Tests Ordered: Current medicines are reviewed at length with the patient today.  Concerns regarding medicines are outlined above.  Orders Placed This Encounter  Procedures  . MR Card Morphology Wo/W Cm  . Aldosterone + renin activity w/ ratio  . TSH   No orders of the defined types were placed in this encounter.   I,Mathew Stumpf,acting as a Education administrator for Skeet Latch, MD.,have documented all relevant documentation on the behalf of Skeet Latch, MD,as directed by  Skeet Latch, MD while in the presence of Skeet Latch, MD.  I, [place] Linsey, MD have reviewed all documentation for this visit.  The documentation of the exam, diagnosis, procedures, and orders on 01/11/2021 are all accurate and complete.   Signed, Skeet Latch, MD  01/11/2021 4:43 PM    [facility]

## 2021-01-10 NOTE — Telephone Encounter (Signed)
  Care Management   Follow Up Note   01/10/2021 Name: [name] MRN: [id number] DOB: 1961-05-12   Referred by: [name], FNP Reason for referral : Chronic Care Management (Unsuccessful call attempt)   A second unsuccessful telephone outreach was attempted today. The patient was referred to the case management team for assistance with care management and care coordination. SW unable to leave a HIPAA compliant voice message requesting a return call due to patients voice mailbox being full.  Follow Up Plan: The care management team will reach out to the patient again over the next 30 days.   Daneen Schick, BSW, CDP Social Worker, Certified Dementia Practitioner [facility] [phone]

## 2021-01-11 ENCOUNTER — Ambulatory Visit (INDEPENDENT_AMBULATORY_CARE_PROVIDER_SITE_OTHER): Payer: Medicare Other | Admitting: Cardiovascular Disease

## 2021-01-11 ENCOUNTER — Encounter: Payer: Self-pay | Admitting: Cardiovascular Disease

## 2021-01-11 ENCOUNTER — Other Ambulatory Visit: Payer: Self-pay

## 2021-01-11 VITALS — BP 172/90 | HR 91 | Ht 68.0 in | Wt 192.6 lb

## 2021-01-11 DIAGNOSIS — I152 Hypertension secondary to endocrine disorders: Secondary | ICD-10-CM

## 2021-01-11 DIAGNOSIS — I208 Other forms of angina pectoris: Secondary | ICD-10-CM | POA: Diagnosis not present

## 2021-01-11 DIAGNOSIS — I10 Essential (primary) hypertension: Secondary | ICD-10-CM

## 2021-01-11 DIAGNOSIS — I517 Cardiomegaly: Secondary | ICD-10-CM

## 2021-01-11 DIAGNOSIS — D3501 Benign neoplasm of right adrenal gland: Secondary | ICD-10-CM | POA: Diagnosis not present

## 2021-01-11 DIAGNOSIS — Z720 Tobacco use: Secondary | ICD-10-CM | POA: Diagnosis not present

## 2021-01-11 DIAGNOSIS — E7800 Pure hypercholesterolemia, unspecified: Secondary | ICD-10-CM

## 2021-01-11 DIAGNOSIS — D3500 Benign neoplasm of unspecified adrenal gland: Secondary | ICD-10-CM

## 2021-01-11 HISTORY — DX: Cardiomegaly: I51.7

## 2021-01-11 HISTORY — DX: Benign neoplasm of unspecified adrenal gland: D35.00

## 2021-01-11 NOTE — Assessment & Plan Note (Signed)
Adrenal adenoma noted on the right in 2018.  Labs at that time were consistent with hyperaldosteronism.  However, she has not had any further follow-up.  She was just seen by nephrology last week and was started on spironolactone.  She has not yet started taking it.  We will check renal and and aldosterone levels today.  Of note, she is taking losartan which may alter the results.  She is already scheduled to get a basic metabolic panel rechecked with him in a week.  He also told her that she needs her adenoma removed.  I completely concur.  Recommend that she see Dr. Cena Benton at [facility].  I will touch base with her nephrologist as well.

## 2021-01-11 NOTE — Assessment & Plan Note (Signed)
Severe LVH previously noted on echo.  She has some swelling on exam but no orthopnea or PND.  Her JVP is not elevated.  She notes that her swelling does improve with elevation of her legs.  Of note, her EKG does not show high voltage.  This constellation of findings is concerning for cardiac amyloidosis.  We will get a cardiac MRI to better assess.  It could all be related to hypertensive cardiomyopathy.

## 2021-01-11 NOTE — Assessment & Plan Note (Signed)
Hyperaldosteronism.   Referral for endocrine surgeon.

## 2021-01-11 NOTE — Patient Instructions (Signed)
Medication Instructions:  Your physician recommends that you continue on your current medications as directed. Please refer to the Current Medication list given to you today.   Labwork: RENIN/ALDOSTERONE/TSH TODAY   YOU WILL NEED BMET ABOUT 1 WEEK FOR PRIOR TO MRI   Testing/Procedures: Your physician has requested that you have a cardiac MRI. Cardiac MRI uses a computer to create images of your heart as its beating, producing both still and moving pictures of your heart and major blood vessels. For further information please visit [email or web address] Please follow the instruction sheet given to you today for more information.  Follow-Up: Your physician wants you to follow-up in: [place] [facility] [place] will receive a reminder letter in the mail two months in advance. If you don't receive a letter, please call our office to schedule the follow-up appointment.

## 2021-01-11 NOTE — Assessment & Plan Note (Signed)
Cessation advised.  Recommend talking with our Care Guide and Social Work.

## 2021-01-11 NOTE — Assessment & Plan Note (Signed)
Smoking cessation advised.

## 2021-01-11 NOTE — Assessment & Plan Note (Signed)
Blood pressure is poorly controlled.  She has polysubstance abuse.  She also has an adrenal adenoma with hyperaldosteronism.  Recommend that she be seen by an endocrine surgeon.  I had good experiences with Dr. Barbera Setters at [facility].  She mentioned that her nephrologist may be sending her to [facility].  Will discuss with him.  Continue amlodipine, hydrochlorothiazide, and losartan.  Agree with adding spironolactone.  Check renin and aldosterone today.  She already is scheduled to get a basic metabolic panel with her nephrologist.  Given that she already has another specialist managing her blood pressure, we will defer.

## 2021-01-11 NOTE — Assessment & Plan Note (Signed)
Continue atorvastatin

## 2021-01-14 ENCOUNTER — Other Ambulatory Visit: Payer: Medicare Other

## 2021-01-15 LAB — ALDOSTERONE + RENIN ACTIVITY W/ RATIO
ALDOS/RENIN RATIO: 7.3 (ref 0.0–30.0)
ALDOSTERONE: 10.6 ng/dL (ref 0.0–30.0)
Renin: 1.455 ng/mL/hr (ref 0.167–5.380)

## 2021-01-15 LAB — TSH: TSH: 0.744 u[IU]/mL (ref 0.450–4.500)

## 2021-01-19 ENCOUNTER — Telehealth: Payer: Self-pay | Admitting: Cardiovascular Disease

## 2021-01-19 NOTE — Telephone Encounter (Signed)
Spoke with patient regarding scheduling preferences for the Cardiac MRI ordered by Dr. Jobe Marker schedule appointment once we have the insurance prior authorization.  Patient voiced her understanding.

## 2021-01-20 ENCOUNTER — Encounter: Payer: Self-pay | Admitting: Cardiovascular Disease

## 2021-01-20 NOTE — Telephone Encounter (Signed)
Spoke with patient regarding the Friday 02/18/21 8:00 am cardiac MRI appointment at [facility]---arrival time is 7:30 am--1st floor admissions office for check in.  Will mail information to patient she ahd and she voiced her understanding.

## 2021-01-25 ENCOUNTER — Ambulatory Visit: Payer: Medicare Other | Admitting: Cardiology

## 2021-01-25 NOTE — Progress Notes (Incomplete)
Cardiology Office Note:    Date:  01/25/2021   ID:  [name], DOB [birth year], MRN [id number]  PCP:  [name], FNP  Cardiologist:  Skeet Latch, MD  Electrophysiologist:  None   Referring MD: [name], FNP   No chief complaint on file.   History of Present Illness:    [name] is a 60 y.o. female with a hx of chronic diastolic heart failure, cocaine use, adrenal adenoma hypertension, venous stasis ulcers, anemia, tobacco use who is referred by [name], NP for evaluation of diastolic heart failure.  She was seen last month in the advanced hypertension clinic.  She been seen in the ED 11/2020 with BP up to 213/88.  Echocardiogram 08/2018 showed LVEF 60 to 75%, grade 1 diastolic dysfunction, severe LVH.  Today,  She denies any chest pain, shortness of breath, palpitations, or exertional symptoms. No headaches, lightheadedness, or syncope to report. Also has no lower extremity edema, orthopnea or PND.   Past Medical History:  Diagnosis Date   Adrenal adenoma 01/11/2021   Anemia    CHF (congestive heart failure) (HCC)    Chronic bronchitis ([place])    "probably once/yr" (6/43/3295)   Diastolic heart failure    Dyslipidemia    pt denies this hx on 05/12/2013   Exertional shortness of breath    GERD (gastroesophageal reflux disease)    Hypertension    Hypokalemia    Left ventricular hypertrophy 01/11/2021   Venous stasis ulcers (HCC)     Past Surgical History:  Procedure Laterality Date   IR US GUIDE VASC ACCESS RIGHT  02/27/2017   IR VENOGRAM ADRENAL BI  02/27/2017   IR VENOGRAM HEPATIC WO HEMODYNAMIC EVALUATION  02/27/2017   IR VENOGRAM RENAL BI  02/27/2017   IR VENOUS SAMPLING  02/27/2017   IR VENOUS SAMPLING  02/27/2017   VAGINAL HYSTERECTOMY  1990's    Current Medications: No outpatient medications have been marked as taking for the 01/25/21 encounter (Appointment) with Donato Heinz, MD.     Allergies:   Lisinopril    Social History   Socioeconomic History   Marital status: Single    Spouse name: Not on file   Number of children: Not on file   Years of education: Not on file   Highest education level: Not on file  Occupational History   Occupation: disability  Tobacco Use   Smoking status: Current Every Day Smoker    Packs/day: 1.00    Years: 36.00    Pack years: 36.00    Types: Cigarettes   Smokeless tobacco: Never Used   Tobacco comment: trying to cut back, 3/29 - only had one cigarette today  Vaping Use   Vaping Use: Never used  Substance and Sexual Activity   Alcohol use: Yes    Alcohol/week: 7.0 standard drinks    Types: 7 Cans of beer per week    Comment: Beer once a week.   Drug use: No    Types: Cocaine    Comment: 05/12/2013 "tried it a few days ago; didn't like it"   Sexual activity: Not on file  Other Topics Concern   Not on file  Social History Narrative   Not on file   Social Determinants of Health   Financial Resource Strain: Low Risk    Difficulty of Paying Living Expenses: Not hard at all  Food Insecurity: No Food Insecurity   Worried About [place] in the Last Year: Never true   Ran Out  of Food in the Last Year: Never true  Transportation Needs: No Transportation Needs   Lack of Transportation (Medical): No   Lack of Transportation (Non-Medical): No  Physical Activity: Inactive   Days of Exercise per Week: 0 days   Minutes of Exercise per Session: 0 min  Stress: No Stress Concern Present   Feeling of Stress : Not at all  Social Connections: Not on file     Family History: The patient's family history includes Diabetes in her sister and sister; Heart attack in her father; Hypertension in her brother, mother, and sister; Kidney failure in her mother.  ROS:   Please see the history of present illness.    (+) All other systems reviewed and are negative.  EKGs/Labs/Other Studies Reviewed:    The following studies were  reviewed today:  US Arterial Seg Multiple LE 11/05/2020: Normal exam. No evidence of hemodynamically significant peripheral arterial disease.  TTE 09/06/2018: - Left ventricle: The cavity size was normal. Wall thickness was  increased in a pattern of severe LVH. Systolic function was  normal. The estimated ejection fraction was in the range of 60%  to 65%. Wall motion was normal; there were no regional wall  motion abnormalities. Doppler parameters are consistent with  abnormal left ventricular relaxation (grade 1 diastolic  dysfunction).  - Aortic valve: There was no stenosis. There was trivial  regurgitation.  - Mitral valve: There was trivial regurgitation.  - Left atrium: The atrium was moderately dilated.  - Right ventricle: The cavity size was normal. Systolic function  was normal.  - Right atrium: The atrium was mildly dilated.  - Pulmonary arteries: No complete TR doppler jet so unable to  estimate PA systolic pressure.  - Inferior vena cava: The vessel was normal in size. The  respirophasic diameter changes were in the normal range (>= 50%),  consistent with normal central venous pressure.   Impressions:  - Normal LV size with severe LV hypertrophy. EF 60-65%. Moderate  LAE. Normal RV size and systolic function. No significant  valvular abnormalities. Cardiac amyloidosis is a consideration.  EKG:   01/25/2021: ***  Recent Labs: 12/04/2020: B Natriuretic Peptide 80.0; Hemoglobin 12.0; Platelets 261 12/09/2020: ALT 14; BUN 39; Creatinine, Ser 1.73; Potassium 3.4; Sodium 143 01/11/2021: TSH 0.744  Recent Lipid Panel    Component Value Date/Time   CHOL 189 10/18/2015 1553   TRIG 212 (H) 10/18/2015 1553   HDL 38 (L) 10/18/2015 1553   CHOLHDL 5.0 (H) 10/18/2015 1553   CHOLHDL 4.9 05/13/2013 0437   VLDL 20 05/13/2013 0437   LDLCALC 109 (H) 10/18/2015 1553    Physical Exam:    VS:  There were no vitals taken for this visit.    Wt Readings  from Last 3 Encounters:  01/11/21 192 lb 9.6 oz (87.4 kg)  12/09/20 192 lb 3.2 oz (87.2 kg)  12/04/20 190 lb (86.2 kg)     GEN: Well nourished, well developed in no acute distress HEENT: Normal NECK: No JVD; No carotid bruits LYMPHATICS: No lymphadenopathy CARDIAC: RRR, no murmurs, rubs, gallops RESPIRATORY:  Clear to auscultation without rales, wheezing or rhonchi  ABDOMEN: Soft, non-tender, non-distended MUSCULOSKELETAL:  No edema; No deformity  SKIN: Warm and dry NEUROLOGIC:  Alert and oriented x 3 PSYCHIATRIC:  Normal affect   ASSESSMENT:    No diagnosis found. PLAN:    Chronic diastolic heart failure: Echo 09/06/2018 showed EF 60 to 65%, severe LVH, grade 1 diastolic dysfunction, normal RV function, no significant valvular  disease.  Hypertension: Labs in 2018 consistent with hyperaldosteronism and found to have adrenal adenoma.  She was seen by Dr. Oval Linsey last month and referred to endocrine surgeon.  She was also recently started on spironolactone.  Currently on amlodipine, HCTZ, losartan, and spironolactone.  LVH: Severe LVH, likely due to uncontrolled hypertension as above.  Cardiac MRI ordered to rule out amyloidosis  CKD stage IIIb: Creatinine 1.7 on 12/09/2020.  Has been referred to nephrology.  Hyperlipidemia: On atorvastatin  Tobacco use: Patient counseled risk of tobacco use and cessation strongly advised  Cocaine use: Patient counseled risk of cocaine use and cessation strongly advised  RTC in***  Medication Adjustments/Labs and Tests Ordered: Current medicines are reviewed at length with the patient today.  Concerns regarding medicines are outlined above.  No orders of the defined types were placed in this encounter.  No orders of the defined types were placed in this encounter.   There are no Patient Instructions on file for this visit.   I,Mathew Stumpf,acting as a Education administrator for Donato Heinz, MD.,have documented all relevant documentation on  the behalf of Donato Heinz, MD,as directed by  Donato Heinz, MD while in the presence of Donato Heinz, MD.  ***  Signed, Madelin Rear  01/25/2021 7:27 AM    [place]

## 2021-01-25 NOTE — Progress Notes (Deleted)
Cardiology Office Note:    Date:  01/25/2021   ID:  Melford Aase, DOB March 01, 1961, MRN [id number]  PCP:  [name], FNP  Cardiologist:  Skeet Latch, MD  Electrophysiologist:  None   Referring MD: [name], FNP   No chief complaint on file. ***  History of Present Illness:    [name] is a 60 y.o. female with a hx of chronic diastolic heart failure, cocaine use, adrenal adenoma hypertension, venous stasis ulcers, anemia, tobacco use who is referred by [name], NP for evaluation of diastolic heart failure.  She was seen last month in the advanced hypertension clinic.  She been seen in the ED 11/2020 with BP up to 213/88.  Echocardiogram 08/2018 showed LVEF 60 to 26%, grade 1 diastolic dysfunction, severe LVH.  Past Medical History:  Diagnosis Date  . Adrenal adenoma 01/11/2021  . Anemia   . CHF (congestive heart failure) ([place])   . Chronic bronchitis ([place])    "probably once/yr" (05/12/2013)  . Diastolic heart failure   . Dyslipidemia    pt denies this hx on 05/12/2013  . Exertional shortness of breath   . GERD (gastroesophageal reflux disease)   . Hypertension   . Hypokalemia   . Left ventricular hypertrophy 01/11/2021  . Venous stasis ulcers (HCC)     Past Surgical History:  Procedure Laterality Date  . IR US GUIDE VASC ACCESS RIGHT  02/27/2017  . IR VENOGRAM ADRENAL BI  02/27/2017  . IR VENOGRAM HEPATIC WO HEMODYNAMIC EVALUATION  02/27/2017  . IR VENOGRAM RENAL BI  02/27/2017  . IR VENOUS SAMPLING  02/27/2017  . IR VENOUS SAMPLING  02/27/2017  . VAGINAL HYSTERECTOMY  1990's    Current Medications: No outpatient medications have been marked as taking for the 01/25/21 encounter (Appointment) with Donato Heinz, MD.     Allergies:   Lisinopril   Social History   Socioeconomic History  . Marital status: Single    Spouse name: Not on file  . Number of children: Not on file  . Years of education: Not on file  . Highest education level: Not  on file  Occupational History  . Occupation: disability  Tobacco Use  . Smoking status: Current Every Day Smoker    Packs/day: 1.00    Years: 36.00    Pack years: 36.00    Types: Cigarettes  . Smokeless tobacco: Never Used  . Tobacco comment: trying to cut back, 3/29 - only had one cigarette today  Vaping Use  . Vaping Use: Never used  Substance and Sexual Activity  . Alcohol use: Yes    Alcohol/week: 7.0 standard drinks    Types: 7 Cans of beer per week    Comment: Beer once a week.  . Drug use: No    Types: Cocaine    Comment: 05/12/2013 "tried it a few days ago; didn't like it"  . Sexual activity: Not on file  Other Topics Concern  . Not on file  Social History Narrative  . Not on file   Social Determinants of Health   Financial Resource Strain: Low Risk   . Difficulty of Paying Living Expenses: Not hard at all  Food Insecurity: No Food Insecurity  . Worried About [employer] in the Last Year: Never true  . Ran Out of Food in the Last Year: Never true  Transportation Needs: No Transportation Needs  . Lack of Transportation (Medical): No  . Lack of Transportation (Non-Medical): No  Physical Activity: Inactive  .  Days of Exercise per Week: 0 days  . Minutes of Exercise per Session: 0 min  Stress: No Stress Concern Present  . Feeling of Stress : Not at all  Social Connections: Not on file     Family History: The patient's ***family history includes Diabetes in her sister and sister; Heart attack in her father; Hypertension in her brother, mother, and sister; Kidney failure in her mother.  ROS:   Please see the history of present illness.    *** All other systems reviewed and are negative.  EKGs/Labs/Other Studies Reviewed:    The following studies were reviewed today: ***  EKG:  EKG is *** ordered today.  The ekg ordered today demonstrates ***  Recent Labs: 12/04/2020: B Natriuretic Peptide 80.0; Hemoglobin 12.0; Platelets 261 12/09/2020: ALT 14; BUN  39; Creatinine, Ser 1.73; Potassium 3.4; Sodium 143 01/11/2021: TSH 0.744  Recent Lipid Panel    Component Value Date/Time   CHOL 189 10/18/2015 1553   TRIG 212 (H) 10/18/2015 1553   HDL 38 (L) 10/18/2015 1553   CHOLHDL 5.0 (H) 10/18/2015 1553   CHOLHDL 4.9 05/13/2013 0437   VLDL 20 05/13/2013 0437   LDLCALC 109 (H) 10/18/2015 1553    Physical Exam:    VS:  There were no vitals taken for this visit.    Wt Readings from Last 3 Encounters:  01/11/21 192 lb 9.6 oz (87.4 kg)  12/09/20 192 lb 3.2 oz (87.2 kg)  12/04/20 190 lb (86.2 kg)     GEN: *** Well nourished, well developed in no acute distress HEENT: Normal NECK: No JVD; No carotid bruits LYMPHATICS: No lymphadenopathy CARDIAC: ***RRR, no murmurs, rubs, gallops RESPIRATORY:  Clear to auscultation without rales, wheezing or rhonchi  ABDOMEN: Soft, non-tender, non-distended MUSCULOSKELETAL:  No edema; No deformity  SKIN: Warm and dry NEUROLOGIC:  Alert and oriented x 3 PSYCHIATRIC:  Normal affect   ASSESSMENT:    No diagnosis found. PLAN:    Chronic diastolic heart failure: Echo 09/06/2018 showed EF 60 to 65%, severe LVH, grade 1 diastolic dysfunction, normal RV function, no significant valvular disease.  Hypertension: Labs in 2018 consistent with hyperaldosteronism and found to have adrenal adenoma.  She was seen by Dr. Oval Linsey last month and referred to endocrine surgeon.  She was also recently started on spironolactone.  Currently on amlodipine, HCTZ, losartan, and spironolactone.  LVH: Severe LVH, likely due to uncontrolled hypertension as above.  Cardiac MRI ordered to rule out amyloidosis  CKD stage IIIb: Creatinine 1.7 on 12/09/2020.  Has been referred to nephrology.  Hyperlipidemia: On atorvastatin  Tobacco use: Patient counseled risk of tobacco use and cessation strongly advised  Cocaine use: Patient counseled risk of cocaine use and cessation strongly advised  RTC in***  Medication Adjustments/Labs and  Tests Ordered: Current medicines are reviewed at length with the patient today.  Concerns regarding medicines are outlined above.  No orders of the defined types were placed in this encounter.  No orders of the defined types were placed in this encounter.   There are no Patient Instructions on file for this visit.   Signed, Donato Heinz, MD  01/25/2021 6:48 AM    [place]

## 2021-02-01 ENCOUNTER — Ambulatory Visit (INDEPENDENT_AMBULATORY_CARE_PROVIDER_SITE_OTHER): Payer: Medicare Other

## 2021-02-01 DIAGNOSIS — N1831 Chronic kidney disease, stage 3a: Secondary | ICD-10-CM | POA: Diagnosis not present

## 2021-02-01 DIAGNOSIS — I509 Heart failure, unspecified: Secondary | ICD-10-CM | POA: Diagnosis not present

## 2021-02-01 NOTE — Patient Instructions (Signed)
Social Worker Visit Information  Goals we discussed today:   Goals Addressed             This Visit's Progress    Barriers to Treatment Identified and Managed       Timeframe:  Long-Range Goal Priority:  Medium Start Date:    3.25.22                         Expected End Date: 4.5.22                      Next planned outreach: 7.14.22  Patient Goals/Self-Care Activities  patient will:   - [place] as needed prior to next scheduled call          Materials Provided: Verbal education about [employer] housing program provided by phone  Follow Up Plan: SW will follow up with patient by phone over the next month  Daneen Schick, BSW, CDP Social Worker, Certified Dementia Practitioner [place] [facility] [place] [facility] [phone]

## 2021-02-01 NOTE — Chronic Care Management (AMB) (Signed)
Chronic Care Management    Social Work Note  02/01/2021 Name: [name] MRN: [id number] DOB: 01-29-1961  [name] is a 60 y.o. year old female who is a primary care patient of [name], [place]. The CCM team was consulted to assist the patient with chronic disease management and/or care coordination needs related to:  Housing Resources .   Engaged with patient by telephone for follow up visit in response to provider referral for social work chronic care management and care coordination services.   Consent to Services:  The patient was given information about Chronic Care Management services, agreed to services, and gave verbal consent prior to initiation of services.  Please see initial visit note for detailed documentation.   Patient agreed to services and consent obtained.   Assessment: Review of patient past medical history, allergies, medications, and health status, including review of relevant consultants reports was performed today as part of a comprehensive evaluation and provision of chronic care management and care coordination services.     SDOH (Social Determinants of Health) assessments and interventions performed:    Advanced Directives Status: Not addressed in this encounter.  CCM Care Plan  Allergies  Allergen Reactions   Lisinopril Cough    Outpatient Encounter Medications as of 02/01/2021  Medication Sig   acetaminophen (TYLENOL) 325 MG tablet Take 650 mg by mouth every 6 (six) hours as needed for mild pain or headache.   amLODipine (NORVASC) 10 MG tablet Take 1 tablet (10 mg total) by mouth daily.   atorvastatin (LIPITOR) 40 MG tablet Take 1 tablet (40 mg total) by mouth daily.   hydrochlorothiazide (HYDRODIURIL) 25 MG tablet Take 1 tablet (25 mg total) by mouth daily.   losartan (COZAAR) 50 MG tablet Take 1 tablet (50 mg total) by mouth daily.   spironolactone (ALDACTONE) 25 MG tablet Take 25 mg by mouth daily.   No facility-administered  encounter medications on file as of 02/01/2021.    Patient Active Problem List   Diagnosis Date Noted   Adrenal adenoma 01/11/2021   Left ventricular hypertrophy 01/11/2021   Substance-induced disorder ([place]) 12/05/2020   Cocaine use disorder ([place]) 12/05/2020   Atypical angina ([place]) 10/11/2020   Stress 02/27/2019   Hypoglycemia without diagnosis of diabetes mellitus 09/06/2018   CKD (chronic kidney disease) stage 3, GFR 30-59 ml/min (HCC) 09/06/2018   Abdominal pain 09/06/2018   Chest pain 09/05/2018   Hypertensive urgency 09/05/2018   Wrist joint effusion, right 05/30/2017   Hypertension due to endocrine disorder 01/17/2017   Hyperaldosteronism ([place]) 01/17/2017   AKI (acute kidney injury) ([place]) 10/19/2015   Varicose veins of left lower extremity with complications 82/80/0349   Anemia 12/25/2014   Encounter for preventive care 11/11/2014   Tobacco abuse 17/91/5056   Diastolic dysfunction without heart failure 06/11/2013   GERD (gastroesophageal reflux disease) 06/11/2013   Severe uncontrolled hypertension 05/12/2013   Hyperlipemia 05/12/2013    Conditions to be addressed/monitored: CHF, HTN, and CKD Stage III ; Housing barriers  Care Plan : Social Work [place]  Updates made by Daneen Schick since 02/01/2021 12:00 AM     Problem: Barriers to Treatment      Long-Range Goal: Barriers to Treatment Identified and Managed   Start Date: 11/12/2020  Expected End Date: 03/12/2021  Priority: Medium  Note:   Current Barriers:  Chronic disease management support and education needs related to CHF, HTN, and CKD Stage III   Housing barriers  Social Worker Clinical Goal(s):   the patient  will work with SW to identify housing resources the patient will engage with Consulting civil engineer to manage HTN, CHF, and CKD III  CCM SW Interventions:  Inter-disciplinary care team collaboration (see longitudinal plan of care) Collaboration with [name], [place] regarding development and  update of comprehensive plan of care as evidenced by provider attestation and co-signature Successful outbound call placed to the patient to assess for goal progression Patient reports she has not received mailed resource information to review regarding housing resources Discussed the patient continues to reside in a hotel with no alternative options for housing Advised the patient to contact [employer] to speak with a Art therapist Provided the patient with the contact number to [employer] follow up call over the next month  Patient Goals/Self-Care Activities  patient will:   -  [place] as needed prior to next scheduled call  Follow Up Plan:  SW will follow up with the patient over the next 45 days       Follow Up Plan: SW will follow up with patient by phone over the next month      Daneen Schick, BSW, CDP Social Worker, Certified Dementia Practitioner [place] [facility] [place] [facility] [phone]  Total time spent performing care coordination and/or care management activities with the patient by phone or face to face = 20 minutes.

## 2021-02-16 ENCOUNTER — Ambulatory Visit: Payer: Medicare Other | Admitting: Nurse Practitioner

## 2021-02-17 ENCOUNTER — Telehealth (HOSPITAL_COMMUNITY): Payer: Self-pay | Admitting: Emergency Medicine

## 2021-02-17 NOTE — Telephone Encounter (Signed)
Attempted to call patient regarding upcoming cardiac MR appointment. Left message on voicemail with name and callback number Anwita Mencer RN Navigator Cardiac Imaging [facility] [phone] Office [phone] Cell  

## 2021-02-17 NOTE — Telephone Encounter (Signed)
Pt returning phone call regarding upcoming cardiac imaging study; pt verbalizes understanding of appt date/time, parking situation and where to check in, pre-test NPO status and medications ordered, and verified current allergies; name and call back number provided for further questions should they arise Marchia Bond RN Navigator Cardiac Imaging [facility] [phone] office [phone] cell   Denies claustro, denies implants

## 2021-02-18 ENCOUNTER — Ambulatory Visit (HOSPITAL_COMMUNITY)
Admission: RE | Admit: 2021-02-18 | Discharge: 2021-02-18 | Disposition: A | Discharge status: Home / Self Care | Payer: Medicare Other | Source: Ambulatory Visit | Attending: Cardiovascular Disease | Admitting: Cardiovascular Disease

## 2021-02-18 ENCOUNTER — Other Ambulatory Visit: Payer: Self-pay

## 2021-02-18 DIAGNOSIS — I517 Cardiomegaly: Secondary | ICD-10-CM

## 2021-02-18 MED ORDER — GADOBUTROL 1 MMOL/ML IV SOLN
10.0000 mL | Freq: Once | INTRAVENOUS | Status: AC | PRN
  Administered 2021-02-18: 10 mL via INTRAVENOUS

## 2021-03-03 ENCOUNTER — Telehealth: Payer: Self-pay

## 2021-03-03 ENCOUNTER — Telehealth: Payer: Medicare Other

## 2021-03-03 NOTE — Telephone Encounter (Signed)
  Care Management   Follow Up Note   03/03/2021 Name: [name] MRN: [id number] DOB: 11-May-1961   Referred by: [name], FNP Reason for referral : Chronic Care Management (Unsuccessful call)   An unsuccessful telephone outreach was attempted today. The patient was referred to the case management team for assistance with care management and care coordination. SW left a HIPAA compliant voice message requesting a return call.  Follow Up Plan: The care management team will reach out to the patient again over the next 30 days.   Daneen Schick, BSW, CDP Social Worker, Certified Dementia Practitioner [place] [facility] [place] [facility] [phone]

## 2021-03-04 ENCOUNTER — Telehealth (HOSPITAL_BASED_OUTPATIENT_CLINIC_OR_DEPARTMENT_OTHER): Payer: Self-pay | Admitting: *Deleted

## 2021-03-04 NOTE — Telephone Encounter (Signed)
Left message to call back  Patient has new patient appointment with Dr Gardiner Rhyme 8/3 ? Did she want to change providers

## 2021-03-04 NOTE — Telephone Encounter (Signed)
-----   Message from Skeet Latch, MD sent at 02/22/2021 12:14 PM EDT ----- Cardiac MRI shows that she has thickening of her heart muscle in a pattern that looks like hypertrophic cardiomyopathy.  There is no sign of amyloidosis which is good.  Not emergent but I recommend that she schedule f/u next available to discuss and follow up.

## 2021-03-15 NOTE — Telephone Encounter (Signed)
After reviewing chart further, Dr Gardiner Rhyme is patients primary cardiologist and Dr Oval Linsey sees [facility]   Left message to call back

## 2021-03-23 ENCOUNTER — Ambulatory Visit: Payer: Medicare Other | Admitting: Cardiology

## 2021-03-24 ENCOUNTER — Ambulatory Visit (INDEPENDENT_AMBULATORY_CARE_PROVIDER_SITE_OTHER): Payer: Medicare Other

## 2021-03-24 DIAGNOSIS — I509 Heart failure, unspecified: Secondary | ICD-10-CM | POA: Diagnosis not present

## 2021-03-24 DIAGNOSIS — N1831 Chronic kidney disease, stage 3a: Secondary | ICD-10-CM | POA: Diagnosis not present

## 2021-03-24 NOTE — Chronic Care Management (AMB) (Signed)
Chronic Care Management    Social Work Note  03/24/2021 Name: [name] MRN: [id number] DOB: 1960-09-08  [name] is a 60 y.o. year old female who is a primary care patient of [name], [place]. The CCM team was consulted to assist the patient with chronic disease management and/or care coordination needs related to:  Housing .   Engaged with patient by telephone for follow up visit in response to provider referral for social work chronic care management and care coordination services.   Consent to Services:  The patient was given information about Chronic Care Management services, agreed to services, and gave verbal consent prior to initiation of services.  Please see initial visit note for detailed documentation.   Patient agreed to services and consent obtained.   Assessment: Review of patient past medical history, allergies, medications, and health status, including review of relevant consultants reports was performed today as part of a comprehensive evaluation and provision of chronic care management and care coordination services.     SDOH (Social Determinants of Health) assessments and interventions performed:    Advanced Directives Status: Not addressed in this encounter.  CCM Care Plan  Allergies  Allergen Reactions   Lisinopril Cough    Outpatient Encounter Medications as of 03/24/2021  Medication Sig   acetaminophen (TYLENOL) 325 MG tablet Take 650 mg by mouth every 6 (six) hours as needed for mild pain or headache.   amLODipine (NORVASC) 10 MG tablet Take 1 tablet (10 mg total) by mouth daily.   atorvastatin (LIPITOR) 40 MG tablet Take 1 tablet (40 mg total) by mouth daily.   hydrochlorothiazide (HYDRODIURIL) 25 MG tablet Take 1 tablet (25 mg total) by mouth daily.   losartan (COZAAR) 50 MG tablet Take 1 tablet (50 mg total) by mouth daily.   spironolactone (ALDACTONE) 25 MG tablet Take 25 mg by mouth daily.   No facility-administered encounter  medications on file as of 03/24/2021.    Patient Active Problem List   Diagnosis Date Noted   Adrenal adenoma 01/11/2021   Left ventricular hypertrophy 01/11/2021   Substance-induced disorder ([place]) 12/05/2020   Cocaine use disorder ([place]) 12/05/2020   Atypical angina ([place]) 10/11/2020   Stress 02/27/2019   Hypoglycemia without diagnosis of diabetes mellitus 09/06/2018   CKD (chronic kidney disease) stage 3, GFR 30-59 ml/min (HCC) 09/06/2018   Abdominal pain 09/06/2018   Chest pain 09/05/2018   Hypertensive urgency 09/05/2018   Wrist joint effusion, right 05/30/2017   Hypertension due to endocrine disorder 01/17/2017   Hyperaldosteronism ([place]) 01/17/2017   AKI (acute kidney injury) ([place]) 10/19/2015   Varicose veins of left lower extremity with complications [id number]   Anemia 12/25/2014   Encounter for preventive care 11/11/2014   Tobacco abuse [id number]   Diastolic dysfunction without heart failure 06/11/2013   GERD (gastroesophageal reflux disease) 06/11/2013   Severe uncontrolled hypertension 05/12/2013   Hyperlipemia 05/12/2013    Conditions to be addressed/monitored: CHF, HTN, and CKD Stage III ; Housing barriers  Care Plan : Social Work [place]  Updates made by Daneen Schick since 03/24/2021 12:00 AM     Problem: Barriers to Treatment      Long-Range Goal: Barriers to Treatment Identified and Managed   Start Date: 11/12/2020  Expected End Date: 03/12/2021  Priority: Medium  Note:   Current Barriers:  Chronic disease management support and education needs related to CHF, HTN, and CKD Stage III   Housing barriers  Social Worker Clinical Goal(s):   the patient will  work with SW to identify housing resources the patient will engage with Consulting civil engineer to manage HTN, CHF, and CKD III  CCM SW Interventions:  Inter-disciplinary care team collaboration (see longitudinal plan of care) Collaboration with [name], [place] regarding development and update of  comprehensive plan of care as evidenced by provider attestation and co-signature Successful outbound call placed to the patient to assess for goal progression Determined 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 - patient reports she works two days a week Discussed the patient continues to live in a hotel due to limited housing options Assessed for outcome of patient call to [employer] - patient reports she attempted to contact them and will try again Encouraged the patient to contact the [employer] to ask for assistance with housing barriers Performed chart review to note [place] has been having barriers getting in contact with the patient to schedule an appointment Encouraged the patient to contact [place] to follow up on scheduling needs Discussed plans for SW to follow up with the patient over the next 45 days  Patient Goals/Self-Care Activities  patient will:   -  [place] as needed prior to next scheduled call  Follow Up Plan:  SW will follow up with the patient over the next 45 days       Follow Up Plan: SW will follow up with patient by phone over the next 45 days      Daneen Schick, BSW, CDP Social Worker, Certified Dementia Practitioner [place] / [place] [facility] [phone]

## 2021-03-24 NOTE — Patient Instructions (Signed)
Social Worker Visit Information  Goals we discussed today:   Goals Addressed             This Visit's Progress    Barriers to Treatment Identified and Managed       Timeframe:  Long-Range Goal Priority:  Medium Start Date:    3.25.22                                            Next planned outreach: 9.2.22  Patient Goals/Self-Care Activities  patient will:   - [place] as needed prior to next scheduled call         Materials Provided: Verbal education about housing resources provided by phone  Follow Up Plan: SW will follow up with patient by phone over the next 45 days   Daneen Schick, [place], CDP Social Worker, Certified Dementia Practitioner [place] [facility] [place] [facility] [phone]

## 2021-04-01 ENCOUNTER — Telehealth: Payer: Medicare Other

## 2021-04-19 ENCOUNTER — Ambulatory Visit
Admission: RE | Admit: 2021-04-19 | Discharge: 2021-04-19 | Disposition: A | Discharge status: Home / Self Care | Payer: Medicare Other | Source: Ambulatory Visit | Attending: Nephrology | Admitting: Nephrology

## 2021-04-19 ENCOUNTER — Other Ambulatory Visit: Payer: Self-pay

## 2021-04-19 DIAGNOSIS — N1832 Chronic kidney disease, stage 3b: Secondary | ICD-10-CM

## 2021-04-22 ENCOUNTER — Telehealth: Payer: Medicare Other

## 2021-04-22 ENCOUNTER — Telehealth: Payer: Self-pay

## 2021-04-22 NOTE — Telephone Encounter (Signed)
  Care Management   Follow Up Note   04/22/2021 Name: [name] MRN: [id number] DOB: May 22, 1961   Referred by: [name], FNP Reason for referral : Chronic Care Management (Unsuccessful call)   An unsuccessful telephone outreach was attempted today. The patient was referred to the case management team for assistance with care management and care coordination. SW left a HIPAA compliant voice message requesting a return call.  Follow Up Plan: The care management team will reach out to the patient again over the next 21 days.   Daneen Schick, BSW, CDP Social Worker, Certified Dementia Practitioner [place] / [place] [facility] [phone]

## 2021-04-22 NOTE — Telephone Encounter (Signed)
  Care Management   Follow Up Note   04/22/2021 Name: [name] MRN: [id number] DOB: 23-Feb-1961   Referred by: [name], FNP Reason for referral : Chronic Care Management (Initial RN CM Outreach - 2nd attempt )   A second unsuccessful telephone outreach was attempted today. The patient was referred to the case management team for assistance with care management and care coordination.   Follow Up Plan: A HIPPA compliant phone message was left for the patient providing contact information and requesting a return call.   Barb Merino, RN, BSN, CCM Care Management Coordinator [place] [facility]  Direct Phone: [phone]

## 2021-04-26 ENCOUNTER — Ambulatory Visit: Payer: Medicare Other

## 2021-05-05 ENCOUNTER — Ambulatory Visit (INDEPENDENT_AMBULATORY_CARE_PROVIDER_SITE_OTHER): Payer: Medicare Other

## 2021-05-05 DIAGNOSIS — N1831 Chronic kidney disease, stage 3a: Secondary | ICD-10-CM

## 2021-05-05 DIAGNOSIS — I152 Hypertension secondary to endocrine disorders: Secondary | ICD-10-CM

## 2021-05-05 DIAGNOSIS — I509 Heart failure, unspecified: Secondary | ICD-10-CM

## 2021-05-05 NOTE — Chronic Care Management (AMB) (Signed)
Chronic Care Management    Social Work Note  05/05/2021 Name: [name] MRN: [id number] DOB: September 17, 1960  [name] is a 60 y.o. year old female who is a primary care patient of [name], [place]. The CCM team was consulted to assist the patient with chronic disease management and/or care coordination needs related to:  housing .   Engaged with patient by telephone for follow up visit in response to provider referral for social work chronic care management and care coordination services.   Consent to Services:  The patient was given information about Chronic Care Management services, agreed to services, and gave verbal consent prior to initiation of services.  Please see initial visit note for detailed documentation.   Patient agreed to services and consent obtained.   Assessment: Review of patient past medical history, allergies, medications, and health status, including review of relevant consultants reports was performed today as part of a comprehensive evaluation and provision of chronic care management and care coordination services.     SDOH (Social Determinants of Health) assessments and interventions performed:    Advanced Directives Status: Not addressed in this encounter.  CCM Care Plan  Allergies  Allergen Reactions   Lisinopril Cough    Outpatient Encounter Medications as of 05/05/2021  Medication Sig   acetaminophen (TYLENOL) 325 MG tablet Take 650 mg by mouth every 6 (six) hours as needed for mild pain or headache.   amLODipine (NORVASC) 10 MG tablet Take 1 tablet (10 mg total) by mouth daily.   atorvastatin (LIPITOR) 40 MG tablet Take 1 tablet (40 mg total) by mouth daily.   hydrochlorothiazide (HYDRODIURIL) 25 MG tablet Take 1 tablet (25 mg total) by mouth daily.   losartan (COZAAR) 50 MG tablet Take 1 tablet (50 mg total) by mouth daily.   spironolactone (ALDACTONE) 25 MG tablet Take 25 mg by mouth daily.   No facility-administered encounter  medications on file as of 05/05/2021.    Patient Active Problem List   Diagnosis Date Noted   Adrenal adenoma 01/11/2021   Left ventricular hypertrophy 01/11/2021   Substance-induced disorder ([place]) 12/05/2020   Cocaine use disorder ([place]) 12/05/2020   Atypical angina ([place]) 10/11/2020   Stress 02/27/2019   Hypoglycemia without diagnosis of diabetes mellitus 09/06/2018   CKD (chronic kidney disease) stage 3, GFR 30-59 ml/min (HCC) 09/06/2018   Abdominal pain 09/06/2018   Chest pain 09/05/2018   Hypertensive urgency 09/05/2018   Wrist joint effusion, right 05/30/2017   Hypertension due to endocrine disorder 01/17/2017   Hyperaldosteronism ([place]) 01/17/2017   AKI (acute kidney injury) ([place]) 10/19/2015   Varicose veins of left lower extremity with complications [id number]   Anemia 12/25/2014   Encounter for preventive care 11/11/2014   Tobacco abuse Q000111Q   Diastolic dysfunction without heart failure 06/11/2013   GERD (gastroesophageal reflux disease) 06/11/2013   Severe uncontrolled hypertension 05/12/2013   Hyperlipemia 05/12/2013    Conditions to be addressed/monitored: CHF, HTN, and CKD Stage III ; Housing barriers  Care Plan : Social Work [place]  Updates made by Daneen Schick since 05/05/2021 12:00 AM  Completed 05/05/2021   Problem: Barriers to Treatment Resolved 05/05/2021     Long-Range Goal: Barriers to Treatment Identified and Managed Completed 05/05/2021  Start Date: 11/12/2020  Expected End Date: 03/12/2021  Priority: Medium  Note:   Current Barriers:  Chronic disease management support and education needs related to CHF, HTN, and CKD Stage III   Housing barriers  Social Worker Clinical Goal(s):  the patient will work with SW to identify housing resources the patient will engage with Consulting civil engineer to manage HTN, CHF, and CKD III  CCM SW Interventions:  Inter-disciplinary care team collaboration (see longitudinal plan of care) Collaboration with  [name], [place] regarding development and update of comprehensive plan of care as evidenced by provider attestation and co-signature Successful outbound call placed to the patient to assess for care coordination needs Discussed 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 SW is available to engage with patient in the future as needed.      Daneen Schick, BSW, CDP Social Worker, Certified Dementia Practitioner [place] / [place] [facility] [phone]

## 2021-05-05 NOTE — Patient Instructions (Signed)
Social Worker Visit Information  Goals we discussed today:   Goals Addressed             This Visit's Progress    COMPLETED: Barriers to Treatment Identified and Managed       Timeframe:  Long-Range Goal Priority:  Medium Start Date:    3.25.22                                            Patient Goals/Self-Care Activities  patient will:   - Remain engaged with housing resources as needed Contact SW as needed         The patient verbalized understanding of instructions, educational materials, and care plan provided today and declined offer to receive copy of patient instructions, Scientist, clinical (histocompatibility and immunogenetics), and care plan.   Follow Up Plan:  No SW follow up planned at this time. Please contact me as needed.   Daneen Schick, BSW, CDP Social Worker, Certified Dementia Practitioner [place] / [place] [facility] [phone]

## 2021-05-11 NOTE — Telephone Encounter (Signed)
Left message to call back on vm that identifies by name

## 2021-05-17 ENCOUNTER — Telehealth: Payer: Medicare Other

## 2021-05-17 ENCOUNTER — Encounter (HOSPITAL_BASED_OUTPATIENT_CLINIC_OR_DEPARTMENT_OTHER): Payer: Self-pay | Admitting: *Deleted

## 2021-05-17 ENCOUNTER — Ambulatory Visit: Payer: Self-pay

## 2021-05-17 DIAGNOSIS — E279 Disorder of adrenal gland, unspecified: Secondary | ICD-10-CM | POA: Diagnosis not present

## 2021-05-17 DIAGNOSIS — I872 Venous insufficiency (chronic) (peripheral): Secondary | ICD-10-CM

## 2021-05-17 DIAGNOSIS — I129 Hypertensive chronic kidney disease with stage 1 through stage 4 chronic kidney disease, or unspecified chronic kidney disease: Secondary | ICD-10-CM | POA: Diagnosis not present

## 2021-05-17 DIAGNOSIS — I509 Heart failure, unspecified: Secondary | ICD-10-CM

## 2021-05-17 DIAGNOSIS — N2581 Secondary hyperparathyroidism of renal origin: Secondary | ICD-10-CM | POA: Diagnosis not present

## 2021-05-17 DIAGNOSIS — N1832 Chronic kidney disease, stage 3b: Secondary | ICD-10-CM | POA: Diagnosis not present

## 2021-05-17 DIAGNOSIS — Z23 Encounter for immunization: Secondary | ICD-10-CM | POA: Diagnosis not present

## 2021-05-17 DIAGNOSIS — I152 Hypertension secondary to endocrine disorders: Secondary | ICD-10-CM

## 2021-05-17 DIAGNOSIS — D631 Anemia in chronic kidney disease: Secondary | ICD-10-CM | POA: Diagnosis not present

## 2021-05-17 DIAGNOSIS — E269 Hyperaldosteronism, unspecified: Secondary | ICD-10-CM | POA: Diagnosis not present

## 2021-05-17 DIAGNOSIS — I5189 Other ill-defined heart diseases: Secondary | ICD-10-CM

## 2021-05-17 DIAGNOSIS — R809 Proteinuria, unspecified: Secondary | ICD-10-CM | POA: Diagnosis not present

## 2021-05-17 DIAGNOSIS — R935 Abnormal findings on diagnostic imaging of other abdominal regions, including retroperitoneum: Secondary | ICD-10-CM | POA: Diagnosis not present

## 2021-05-17 LAB — CBC AND DIFFERENTIAL
HCT: 35 — AB (ref 36–46)
Hemoglobin: 11.6 — AB (ref 12.0–16.0)
Neutrophils Absolute: 2.6
Platelets: 318 (ref 150–399)
WBC: 4.5

## 2021-05-17 LAB — BASIC METABOLIC PANEL
BUN: 40 — AB (ref 4–21)
CO2: 32 — AB (ref 13–22)
Chloride: 95 — AB (ref 99–108)
Creatinine: 1.7 — AB (ref 0.5–1.1)
Glucose: 91
Potassium: 3.1 — AB (ref 3.4–5.3)
Sodium: 138 (ref 137–147)

## 2021-05-17 LAB — COMPREHENSIVE METABOLIC PANEL
Albumin: 3.7 (ref 3.5–5.0)
Calcium: 9.2 (ref 8.7–10.7)
GFR calc non Af Amer: 34
Globulin: 3.9

## 2021-05-17 LAB — CBC: RBC: 4.01 (ref 3.87–5.11)

## 2021-05-17 LAB — HEPATITIS B SURFACE ANTIGEN: Hepatitis B Surface Ag: NEGATIVE

## 2021-05-17 LAB — HIV ANTIBODY (ROUTINE TESTING W REFLEX): HIV 1&2 Ab, 4th Generation: NEGATIVE

## 2021-05-17 NOTE — Telephone Encounter (Signed)
Never heard back from patient Printed, highlighted Dr Blenda Mounts comments and mailed to patient

## 2021-05-20 DIAGNOSIS — I152 Hypertension secondary to endocrine disorders: Secondary | ICD-10-CM | POA: Diagnosis not present

## 2021-05-20 DIAGNOSIS — I509 Heart failure, unspecified: Secondary | ICD-10-CM | POA: Diagnosis not present

## 2021-05-20 DIAGNOSIS — N1831 Chronic kidney disease, stage 3a: Secondary | ICD-10-CM

## 2021-05-23 NOTE — Patient Instructions (Signed)
Visit Information   PATIENT GOALS:   Goals Addressed      Follow My Treatment Plan-Chronic Kidney   On track    Timeframe:  Long-Range Goal Priority:  High Start Date:  05/17/21                           Expected End Date:  05/17/22                     Follow Up Date: 06/16/21    Patient Goals: - increase water to 64 oz daily    Why is this important?   Staying as healthy as you can is very important. This may mean making changes if you smoke, don't exercise or eat poorly.  A healthy lifestyle is an important goal for you.  Following the treatment plan and making changes may be hard.  Try some of these steps to help keep the disease from getting worse.     Notes:      Hypertension Monitored   On track    Timeframe:  Long-Range Goal Priority:  High Start Date:  05/17/21                           Expected End Date:  05/17/22  Follow up date: 06/16/21  Patient Goals: - check blood pressure daily - choose a place to take my blood pressure (home, clinic or office, retail store) - write blood pressure results in a log or diary - learn about high blood pressure                         COMPLETED: Track and Manage My Blood Pressure-Hypertension       Timeframe:  Long-Range Goal Priority:  High Start Date:    11/12/20                        Expected End Date:   02/12/21                     - check blood pressure 3 times per week - learn about hypertension   Why is this important?   You won't feel high blood pressure, but it can still hurt your blood vessels.  High blood pressure can cause heart or kidney problems. It can also cause a stroke.  Making lifestyle changes like losing a  weight or eating less salt will help.  Checking your blood pressure at home and at different times of the day can help to control blood pressure.  If the doctor prescribes medicine remember to take it the way the doctor ordered.  Call the office if you cannot afford the medicine or if there are  questions about it.     Notes:      Consent to CCM Services: Ms. Cocke was given information about Chronic Care Management services including:  CCM service includes personalized support from designated clinical staff supervised by her physician, including individualized plan of care and coordination with other care providers 24/7 contact phone numbers for assistance for urgent and routine care needs. Service will only be billed when office clinical staff spend 20 minutes or more in a month to coordinate care. Only one practitioner may furnish and bill the service in a calendar month. The patient may stop CCM services at any time (effective at the  end of the month) by phone call to the office staff. The patient will be responsible for cost sharing (co-pay) of up to 20% of the service fee (after annual deductible is met).  Patient agreed to services and verbal consent obtained.   The patient verbalized understanding of instructions, educational materials, and care plan provided today and declined offer to receive copy of patient instructions, educational materials, and care plan.   Telephone follow up appointment with care management team member scheduled for: 06/16/21  Barb Merino, RN, BSN, CCM Care Management Coordinator [place] [facility]  Direct Phone: [phone]    CLINICAL CARE PLAN: Patient Care Plan: Hypertension (Adult)  Completed 05/17/2021   Problem Identified: Poorly Controlled HTN/Cardiovascular Disease Resolved 05/17/2021  Priority: High     Long-Range Goal: Knowledge Deficits related to Development of Long Term plan for management of cardiovascular disease Completed 05/17/2021  Start Date: 11/12/2020  Expected End Date: 02/10/2021  Recent Progress: On track  Priority: High  Note:   Objective:  Last practice recorded BP readings:  BP Readings from Last 3 Encounters:  10/11/20 (!) 190/100  02/27/19 (!) 200/100  09/07/18 (!)  193/96  Current Barriers:  Knowledge Deficits related to basic understanding of hypertension pathophysiology and self care management - patient with poorly controlled hypertension, hyperlipidemia, diastolic dysfunction without heart failure, peripheral venous stasis ulcer  Difficulty obtaining medications - patient recently out of medications for as many as 2 weeks Non-adherence to prescribed medication regimen Financial Constraints - works part time at [employer]; [place] working with patient to address SDOH needs Does not adhere to provider recommendations re: smoking cessation Does not adhere to prescribed medication regimen Case Manager Clinical Goal(s):  patient will verbalize basic understanding of hypertension disease process and self health management plan as evidenced by collaboration with RNCM to address cardiovascular disease management planning Interventions:  Collaboration with [name], [place] regarding development and update of comprehensive plan of care as evidenced by provider attestation and co-signature Inter-disciplinary care team collaboration (see longitudinal plan of care) including review of patient CM needs with [facility] BSW Evaluation of current treatment plan related to hypertension self management and patient's adherence to plan as established by provider. Self-Care Activities: - Calls provider office for new concerns, questions Patient Goals: - check blood pressure 3 times per week - learn about high blood pressure  Follow Up Plan: Telephone follow up appointment with care management team member scheduled for: next 30 days     Patient Care Plan: Social Work [place]  Completed 05/05/2021   Problem Identified: Barriers to Treatment Resolved 05/05/2021     Long-Range Goal: Barriers to Treatment Identified and Managed Completed 05/05/2021  Start Date: 11/12/2020  Expected End Date: 03/12/2021  Priority: Medium  Note:   Current Barriers:  Chronic disease  management support and education needs related to CHF, HTN, and CKD Stage III   Housing barriers  Social Worker Clinical Goal(s):   the patient will work with SW to identify housing resources the patient will engage with Consulting civil engineer to manage HTN, CHF, and CKD III  CCM SW Interventions:  Inter-disciplinary care team collaboration (see longitudinal plan of care) Collaboration with [name], [place] regarding development and update of comprehensive plan of care as evidenced by provider attestation and co-signature Successful outbound call placed to the patient to assess for care coordination needs Discussed the 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 Patient continues to reside in  a hotel due to limited housing options Assessed for patient ability to afford to live in hotel - patient stated she is able to afford cost due to working and disability income No acute SW needs identified at this time  Patient Goals/Self-Care Activities  patient will:   -  Remain engaged with housing resources as needed Contact SW as needed   Follow Up Plan: No follow up planned, patient has been provided with housing resources and is able to afford to remain in hotel     Patient Care Plan: Chronic Kidney (Adult)     Problem Identified: Disease Progression   Priority: High     Long-Range Goal: Disease Progression Prevented or Minimized   Start Date: 05/17/2021  Expected End Date: 05/17/2022  This Visit's Progress: On track  Priority: High  Note:   Current Barriers:  Ineffective Self Health Maintenance in a patient with HTN, CKD, Venous Stasis Ulcer, HLD, Diastolic Dysfunction  Housing Barriers  Clinical Goal(s):  Collaboration with [name], FNP regarding development and update of comprehensive plan of care as evidenced by provider attestation and co-signature Inter-disciplinary care team collaboration (see longitudinal plan of care) patient will work with care  management team to address care coordination and chronic disease management needs related to Disease Management Educational Needs Care Coordination Medication Management and Education Medication Reconciliation Psychosocial Support   Interventions:  05/17/21 completed successful outbound call with patient  Evaluation of current treatment plan related to CKD Stage III , self-management and patient's adherence to plan as established by provider. Collaboration with [name], FNP regarding development and update of comprehensive plan of care as evidenced by provider attestation       and co-signature Inter-disciplinary care team collaboration (see longitudinal plan of care) Provided education to patient about basic disease process related to Chronic Kidney disease  Review of patient status, including review of consultant's reports, relevant laboratory and other test results, and medications completed. Reviewed medications with patient and discussed importance of medication adherence Educated patient while providing rationale the importance to increase water to 64 oz daily  Discussed plans with patient for ongoing care management follow up and provided patient with direct contact information for care management team Self Care Activities:  Self administers medications as prescribed Attends all scheduled provider appointments Calls pharmacy for medication refills Calls provider office for new concerns or questions Patient Goals: - increase water to 64 oz daily  Follow Up Plan: Telephone follow up appointment with care management team member scheduled for: 06/16/21      Patient Care Plan: Hypertension (Adult)     Problem Identified: Hypertension due to Endocrine disorder   Priority: High     Long-Range Goal: Hypertension Monitored   Start Date: 05/17/2021  Expected End Date: 05/17/2022  This Visit's Progress: On track  Priority: High  Note:   Objective:  Last practice recorded BP  readings:  BP Readings from Last 3 Encounters:  01/11/21 (!) 172/90  12/09/20 138/80  12/05/20 (!) 180/84   Most recent eGFR/CrCl:  Lab Results  Component Value Date   EGFR 33 (L) 12/09/2020    No components found for: CRCL Current Barriers:  Knowledge Deficits related to basic understanding of hypertension pathophysiology and self care management Knowledge Deficits related to understanding of medications prescribed for management of hypertension Housing Barriers Case Manager Clinical Goal(s):  patient will verbalize understanding of plan for hypertension management patient will demonstrate improved adherence to prescribed treatment plan for hypertension as evidenced by taking all medications  as prescribed, monitoring and recording blood pressure as directed, adhering to low sodium/DASH diet Interventions:  05/17/21 completed successful outbound call with patient  Collaboration with [name], [place] regarding development and update of comprehensive plan of care as evidenced by provider attestation and co-signature Inter-disciplinary care team collaboration (see longitudinal plan of care) Evaluation of current treatment plan related to hypertension self management and patient's adherence to plan as established by provider. Determined patient has uncontrolled Hypertension secondary to having Hyperaldosteronism  Reviewed medications with patient and discussed importance of compliance Advised patient, providing education and rationale, to monitor blood pressure daily and record, calling PCP for findings outside established parameters.  Reviewed scheduled/upcoming provider appointments including: CT of abdomen scheduled today, 05/17/21 for further evaluation of adrenal mass  Discussed plans with patient for ongoing care management follow up and provided patient with direct contact information for care management team Self-Care Activities: - Self administers medications as prescribed Attends  all scheduled provider appointments Calls provider office for new concerns, questions, or BP outside discussed parameters Checks BP and records as discussed Follows a low sodium diet/DASH diet Patient Goals: - check blood pressure daily - choose a place to take my blood pressure (home, clinic or office, retail store) - write blood pressure results in a log or diary - learn about high blood pressure  Follow Up Plan: Telephone follow up appointment with care management team member scheduled for:  06/16/21

## 2021-05-23 NOTE — Chronic Care Management (AMB) (Signed)
Chronic Care Management   CCM RN Visit Note  05/17/2021 Name: [name] MRN: [id number] DOB: [birth year]  Subjective: [name] is a 60 y.o. year old female who is a primary care patient of [name], [place]. The care management team was consulted for assistance with disease management and care coordination needs.    Engaged with patient by telephone for initial visit in response to provider referral for case management and/or care coordination services.   Consent to Services:  The patient was given information about Chronic Care Management services, agreed to services, and gave verbal consent prior to initiation of services.  Please see initial visit note for detailed documentation.   Patient agreed to services and verbal consent obtained.   Assessment: Review of patient past medical history, allergies, medications, health status, including review of consultants reports, laboratory and other test data, was performed as part of comprehensive evaluation and provision of chronic care management services.   SDOH (Social Determinants of Health) assessments and interventions performed:  Yes, no acute challenges   CCM Care Plan  Allergies  Allergen Reactions   Lisinopril Cough    Outpatient Encounter Medications as of 05/17/2021  Medication Sig   acetaminophen (TYLENOL) 325 MG tablet Take 650 mg by mouth every 6 (six) hours as needed for mild pain or headache.   amLODipine (NORVASC) 10 MG tablet Take 1 tablet (10 mg total) by mouth daily.   atorvastatin (LIPITOR) 40 MG tablet Take 1 tablet (40 mg total) by mouth daily.   spironolactone (ALDACTONE) 25 MG tablet Take 25 mg by mouth daily.   No facility-administered encounter medications on file as of 05/17/2021.    Patient Active Problem List   Diagnosis Date Noted   Adrenal adenoma 01/11/2021   Left ventricular hypertrophy 01/11/2021   Substance-induced disorder ([place]) 12/05/2020   Cocaine use disorder ([place]) 12/05/2020    Atypical angina ([place]) 10/11/2020   Stress 02/27/2019   Hypoglycemia without diagnosis of diabetes mellitus 09/06/2018   CKD (chronic kidney disease) stage 3, GFR 30-59 ml/min (HCC) 09/06/2018   Abdominal pain 09/06/2018   Chest pain 09/05/2018   Hypertensive urgency 09/05/2018   Wrist joint effusion, right 05/30/2017   Hypertension due to endocrine disorder 01/17/2017   Hyperaldosteronism ([place]) 01/17/2017   AKI (acute kidney injury) ([place]) 10/19/2015   Varicose veins of left lower extremity with complications 19/62/2297   Anemia 12/25/2014   Encounter for preventive care 11/11/2014   Tobacco abuse 98/92/1194   Diastolic dysfunction without heart failure 06/11/2013   GERD (gastroesophageal reflux disease) 06/11/2013   Severe uncontrolled hypertension 05/12/2013   Hyperlipemia 05/12/2013    Conditions to be addressed/monitored: HTN, CKD, Venous Stasis Ulcer, HLD, Diastolic Dysfunction   Care Plan : Hypertension (Adult)  Updates made by Lynne Logan, RN since 05/17/2021 12:00 AM  Completed 05/23/2021   Problem: Poorly Controlled HTN/Cardiovascular Disease Resolved 05/17/2021  Priority: High     Long-Range Goal: Knowledge Deficits related to Development of Long Term plan for management of cardiovascular disease Completed 05/17/2021  Start Date: 11/12/2020  Expected End Date: 02/10/2021  Recent Progress: On track  Priority: High  Note:   Objective:  Last practice recorded BP readings:  BP Readings from Last 3 Encounters:  10/11/20 (!) 190/100  02/27/19 (!) 200/100  09/07/18 (!) 193/96  Current Barriers:  Knowledge Deficits related to basic understanding of hypertension pathophysiology and self care management - patient with poorly controlled hypertension, hyperlipidemia, diastolic dysfunction without heart failure, peripheral venous stasis ulcer  Difficulty obtaining  medications - patient recently out of medications for as many as 2 weeks Non-adherence to prescribed medication  regimen 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[employer]; [place] working with patient to address SDOH needs Does not adhere to provider recommendations re: smoking cessation Does not adhere to prescribed medication regimen Case Manager Clinical Goal(s):  patient will verbalize basic understanding of hypertension disease process and self health management plan as evidenced by collaboration with RNCM to address cardiovascular disease management planning Interventions:  Collaboration with [name], FNP regarding development and update of comprehensive plan of care as evidenced by provider attestation and co-signature Inter-disciplinary care team collaboration (see longitudinal plan of care) including review of patient CM needs with [facility] BSW Evaluation of current treatment plan related to hypertension self management and patient's adherence to plan as established by provider. Self-Care Activities: - Calls provider office for new concerns, questions Patient Goals: - check blood pressure 3 times per week - learn about high blood pressure  Follow Up Plan: Telephone follow up appointment with care management team member scheduled for: next 30 days     Care Plan : Chronic Kidney (Adult)  Updates made by Lynne Logan, RN since 05/17/2021 12:00 AM     Problem: Disease Progression   Priority: High     Long-Range Goal: Disease Progression Prevented or Minimized   Start Date: 05/17/2021  Expected End Date: 05/17/2022  This Visit's Progress: On track  Priority: High  Note:   Current Barriers:  Ineffective Self Health Maintenance in a patient with HTN, CKD, Venous Stasis Ulcer, HLD, Diastolic Dysfunction  Housing Barriers  Clinical Goal(s):  Collaboration with [name], FNP regarding development and update of comprehensive plan of care as evidenced by provider attestation and co-signature Inter-disciplinary care team collaboration (see longitudinal plan of care) patient will  work with care management team to address care coordination and chronic disease management needs related to Disease Management Educational Needs Care Coordination Medication Management and Education Medication Reconciliation Psychosocial Support   Interventions:  05/17/21 completed successful outbound call with patient  Evaluation of current treatment plan related to CKD Stage III , self-management and patient's adherence to plan as established by provider. Collaboration with [name], FNP regarding development and update of comprehensive plan of care as evidenced by provider attestation       and co-signature Inter-disciplinary care team collaboration (see longitudinal plan of care) Provided education to patient about basic disease process related to Chronic Kidney disease  Review of patient status, including review of consultant's reports, relevant laboratory and other test results, and medications completed. Reviewed medications with patient and discussed importance of medication adherence Educated patient while providing rationale the importance to increase water to 64 oz daily  Discussed plans with patient for ongoing care management follow up and provided patient with direct contact information for care management team Self Care Activities:  Self administers medications as prescribed Attends all scheduled provider appointments Calls pharmacy for medication refills Calls provider office for new concerns or questions Patient Goals: - increase water to 64 oz daily  Follow Up Plan: Telephone follow up appointment with care management team member scheduled for: 06/16/21      Care Plan : Hypertension (Adult)  Updates made by Lynne Logan, RN since 05/17/2021 12:00 AM     Problem: Hypertension due to Endocrine disorder   Priority: High     Long-Range Goal: Hypertension Monitored   Start Date: 05/17/2021  Expected End Date: 05/17/2022  This Visit's Progress:  On track   Priority: High  Note:   Objective:  Last practice recorded BP readings:  BP Readings from Last 3 Encounters:  01/11/21 (!) 172/90  12/09/20 138/80  12/05/20 (!) 180/84   Most recent eGFR/CrCl:  Lab Results  Component Value Date   EGFR 33 (L) 12/09/2020    No components found for: CRCL Current Barriers:  Knowledge Deficits related to basic understanding of hypertension pathophysiology and self care management Knowledge Deficits related to understanding of medications prescribed for management of hypertension Housing Barriers Case Manager Clinical Goal(s):  patient will verbalize understanding of plan for hypertension management patient will demonstrate improved adherence to prescribed treatment plan for hypertension as evidenced by taking all medications as prescribed, monitoring and recording blood pressure as directed, adhering to low sodium/DASH diet Interventions:  05/17/21 completed successful outbound call with patient  Collaboration with [name], FNP regarding development and update of comprehensive plan of care as evidenced by provider attestation and co-signature Inter-disciplinary care team collaboration (see longitudinal plan of care) Evaluation of current treatment plan related to hypertension self management and patient's adherence to plan as established by provider. Determined patient has uncontrolled Hypertension secondary to having Hyperaldosteronism  Reviewed medications with patient and discussed importance of compliance Advised patient, providing education and rationale, to monitor blood pressure daily and record, calling PCP for findings outside established parameters.  Reviewed scheduled/upcoming provider appointments including: CT of abdomen scheduled today, 05/17/21 for further evaluation of adrenal mass  Discussed plans with patient for ongoing care management follow up and provided patient with direct contact information for care management team Self-Care  Activities: - Self administers medications as prescribed Attends all scheduled provider appointments Calls provider office for new concerns, questions, or BP outside discussed parameters Checks BP and records as discussed Follows a low sodium diet/DASH diet Patient Goals: - check blood pressure daily - choose a place to take my blood pressure (home, clinic or office, retail store) - write blood pressure results in a log or diary - learn about high blood pressure  Follow Up Plan: Telephone follow up appointment with care management team member scheduled for:  06/16/21    Plan:Telephone follow up appointment with care management team member scheduled for:  06/16/21  Barb Merino, RN, BSN, CCM Care Management Coordinator [place] [facility]  Direct Phone: [phone]

## 2021-05-26 ENCOUNTER — Encounter: Payer: Self-pay | Admitting: Nurse Practitioner

## 2021-06-16 ENCOUNTER — Telehealth: Payer: Medicare Other

## 2021-06-28 DIAGNOSIS — E269 Hyperaldosteronism, unspecified: Secondary | ICD-10-CM | POA: Diagnosis not present

## 2021-07-01 ENCOUNTER — Telehealth: Payer: Medicare Other

## 2021-07-01 ENCOUNTER — Telehealth: Payer: Self-pay

## 2021-07-01 NOTE — Telephone Encounter (Signed)
  Care Management   Follow Up Note   07/01/2021 Name: [name] MRN: [id number] DOB: 1961/04/24   Referred by: [name], FNP Reason for referral : Chronic Care Management (RN CM Follow up call )   An unsuccessful telephone outreach was attempted today. The patient was referred to the case management team for assistance with care management and care coordination.   Follow Up Plan: Telephone follow up appointment with care management team member scheduled for: 07/29/21  Barb Merino, RN, BSN, CCM Care Management Coordinator [place] [facility]  Direct Phone: [phone]

## 2021-07-29 ENCOUNTER — Ambulatory Visit: Payer: Self-pay

## 2021-07-29 ENCOUNTER — Telehealth: Payer: Medicare Other

## 2021-07-29 DIAGNOSIS — I872 Venous insufficiency (chronic) (peripheral): Secondary | ICD-10-CM

## 2021-07-29 DIAGNOSIS — N1831 Chronic kidney disease, stage 3a: Secondary | ICD-10-CM

## 2021-07-29 DIAGNOSIS — I5189 Other ill-defined heart diseases: Secondary | ICD-10-CM

## 2021-07-29 DIAGNOSIS — I152 Hypertension secondary to endocrine disorders: Secondary | ICD-10-CM

## 2021-08-01 DIAGNOSIS — E269 Hyperaldosteronism, unspecified: Secondary | ICD-10-CM | POA: Diagnosis not present

## 2021-08-01 DIAGNOSIS — F17200 Nicotine dependence, unspecified, uncomplicated: Secondary | ICD-10-CM | POA: Diagnosis not present

## 2021-08-01 DIAGNOSIS — E1122 Type 2 diabetes mellitus with diabetic chronic kidney disease: Secondary | ICD-10-CM | POA: Diagnosis not present

## 2021-08-01 DIAGNOSIS — U071 COVID-19: Secondary | ICD-10-CM | POA: Diagnosis not present

## 2021-08-01 DIAGNOSIS — N183 Chronic kidney disease, stage 3 unspecified: Secondary | ICD-10-CM | POA: Diagnosis not present

## 2021-08-01 DIAGNOSIS — Z79899 Other long term (current) drug therapy: Secondary | ICD-10-CM | POA: Diagnosis not present

## 2021-08-01 DIAGNOSIS — E785 Hyperlipidemia, unspecified: Secondary | ICD-10-CM | POA: Diagnosis not present

## 2021-08-01 DIAGNOSIS — Z5309 Procedure and treatment not carried out because of other contraindication: Secondary | ICD-10-CM | POA: Diagnosis not present

## 2021-08-01 DIAGNOSIS — I129 Hypertensive chronic kidney disease with stage 1 through stage 4 chronic kidney disease, or unspecified chronic kidney disease: Secondary | ICD-10-CM | POA: Diagnosis not present

## 2021-08-01 DIAGNOSIS — Z7982 Long term (current) use of aspirin: Secondary | ICD-10-CM | POA: Diagnosis not present

## 2021-08-01 NOTE — Chronic Care Management (AMB) (Signed)
Chronic Care Management   CCM RN Visit Note  07/29/2021 Name: [name] MRN: [id number] DOB: 1960-09-17  Subjective: [name] is a 60 y.o. year old female who is a primary care patient of [name], [place]. The care management team was consulted for assistance with disease management and care coordination needs.    Engaged with patient by telephone for follow up visit in response to provider referral for case management and/or care coordination services.   Consent to Services:  The patient was given information about Chronic Care Management services, agreed to services, and gave verbal consent prior to initiation of services.  Please see initial visit note for detailed documentation.   Patient agreed to services and verbal consent obtained.   Assessment: Review of patient past medical history, allergies, medications, health status, including review of consultants reports, laboratory and other test data, was performed as part of comprehensive evaluation and provision of chronic care management services.   SDOH (Social Determinants of Health) assessments and interventions performed:  Yes, no new challenges   CCM Care Plan  Allergies  Allergen Reactions   Lisinopril Cough    Outpatient Encounter Medications as of 07/29/2021  Medication Sig   acetaminophen (TYLENOL) 325 MG tablet Take 650 mg by mouth every 6 (six) hours as needed for mild pain or headache.   amLODipine (NORVASC) 10 MG tablet Take 1 tablet (10 mg total) by mouth daily.   atorvastatin (LIPITOR) 40 MG tablet Take 1 tablet (40 mg total) by mouth daily.   hydrochlorothiazide (HYDRODIURIL) 25 MG tablet Take 1 tablet (25 mg total) by mouth daily.   losartan (COZAAR) 50 MG tablet Take 1 tablet (50 mg total) by mouth daily.   spironolactone (ALDACTONE) 25 MG tablet Take 25 mg by mouth daily. (Patient not taking: Reported on 07/29/2021)   No facility-administered encounter medications on file as of 07/29/2021.     Patient Active Problem List   Diagnosis Date Noted   Adrenal adenoma 01/11/2021   Left ventricular hypertrophy 01/11/2021   Substance-induced disorder ([place]) 12/05/2020   Cocaine use disorder ([place]) 12/05/2020   Atypical angina ([place]) 10/11/2020   Stress 02/27/2019   Hypoglycemia without diagnosis of diabetes mellitus 09/06/2018   CKD (chronic kidney disease) stage 3, GFR 30-59 ml/min (HCC) 09/06/2018   Abdominal pain 09/06/2018   Chest pain 09/05/2018   Hypertensive urgency 09/05/2018   Wrist joint effusion, right 05/30/2017   Hypertension due to endocrine disorder 01/17/2017   Hyperaldosteronism ([place]) 01/17/2017   AKI (acute kidney injury) ([place]) 10/19/2015   Varicose veins of left lower extremity with complications 43/15/4008   Anemia 12/25/2014   Encounter for preventive care 11/11/2014   Tobacco abuse 67/61/9509   Diastolic dysfunction without heart failure 06/11/2013   GERD (gastroesophageal reflux disease) 06/11/2013   Severe uncontrolled hypertension 05/12/2013   Hyperlipemia 05/12/2013    Conditions to be addressed/monitored: HTN, CKD, Venous Stasis Ulcer, HLD, Diastolic Dysfunction  Care Plan : RN Care Manager Plan of Care  Updates made by Lynne Logan, RN since 07/29/2021 12:00 AM     Problem: No plan of care established for management of chronic disease states (HTN, CKD, Venous Stasis Ulcer, HLD, Diastolic Dysfunction, Hyperaldosteronism)   Priority: High     Long-Range Goal: Development of plan of care for chronic disease states (HTN, CKD, Venous Stasis Ulcer, HLD, Diastolic Dysfunction)   Start Date: 07/29/2021  Expected End Date: 07/29/2022  This Visit's Progress: On track  Priority: High  Note:   Current Barriers:  Knowledge Deficits related to plan of care for management of HTN, CKD, Venous Stasis Ulcer, HLD, Diastolic Dysfunction   Chronic Disease Management support and education needs related to HTN, CKD, Venous Stasis Ulcer, HLD, Diastolic Dysfunction     RNCM Clinical Goal(s):  Patient will verbalize basic understanding of  HTN, CKD, Venous Stasis Ulcer, HLD, Diastolic Dysfunction  disease process and self health management plan as evidenced by patient will report having no disease exacerbations related to her chronic disease states listed above take all medications exactly as prescribed and will call provider for medication related questions as evidenced by patient will report missing no doses of her prescribed medications  demonstrate Improved health management independence as evidenced by patient will report 100% adherence to her prescribed treatment plan  continue to work with RN Care Manager to address care management and care coordination needs related to  HTN, CKD, Venous Stasis Ulcer, HLD, Diastolic Dysfunction  as evidenced by adherence to CM Team Scheduled appointments demonstrate ongoing self health care management ability   as evidenced by    through collaboration with RN Care manager, provider, and care team.   Interventions: 1:1 collaboration with primary care provider regarding development and update of comprehensive plan of care as evidenced by provider attestation and co-signature Inter-disciplinary care team collaboration (see longitudinal plan of care) Evaluation of current treatment plan related to  self management and patient's adherence to plan as established by provider   Chronic Kidney Disease Interventions:  (Status:  Condition stable.  Not addressed this visit.) Long Term Goal Provided education to patient about basic CKD disease process Review of patient status, including review of consultant's reports, relevant laboratory and other test results, and medications completed. Reviewed medications with patient and discussed importance of medication adherence Educated patient on the importance to keep BP well controlled and increase water intake to 3-4 bottles per day (64 oz daily) Last practice recorded BP readings:  BP  Readings from Last 3 Encounters:  01/11/21 (!) 172/90  12/09/20 138/80  12/05/20 (!) 180/84  Most recent eGFR/CrCl:  Lab Results  Component Value Date   EGFR 33 (L) 12/09/2020    No components found for: CRCL   Diabetes Interventions:  (Status:  Condition stable.  Not addressed this visit.) Long Term Goal Assessed patient's understanding of A1c goal:  <5.7 Provided education to patient about basic DM disease process Review of patient status, including review of consultant's reports, relevant laboratory and other test results, and medications completed. Reviewed medications with patient and discussed importance of medication adherence Educated on dietary and exercise recommendations for ongoing DM management  Lab Results  Component Value Date   HGBA1C 5.1 10/17/2017   Surgery (Adrenalectomy:  (Status: New goal.) Short Term Goal Evaluation of current treatment plan related to Adrenalectomy robotic procedure scheduled for 08/01/21 assessed patient/caregiver understanding of surgical procedure   reviewed pre-operative instructions with patient/caregiver  reviewed post-operative instructions with patient/caregiver reviewed medications with patient and addressed questions Instructed patient to contact [facility] to schedule a PCP follow up  Collaborated with PCP regarding patient's planned procedure scheduled for 08/01/21 for adrenalectomy robotic procedure w/biopsy Discussed plans with patient for ongoing care management follow up and provided patient with direct contact information for care management team  Patient Goals/Self-Care Activities: Take all medications as prescribed Attend all scheduled provider appointments Call pharmacy for medication refills 3-7 days in advance of running out of medications Perform all self care activities independently  Perform IADL's (shopping, preparing meals, housekeeping, managing finances) independently  call doctor for signs and symptoms of high blood  pressure keep all doctor appointments take medications for blood pressure exactly as prescribed report new symptoms to your doctor  Follow Up Plan:  Telephone follow up appointment with care management team member scheduled for:  08/05/21     Plan:Telephone follow up appointment with care management team member scheduled for:  08/05/21  Barb Merino, RN, BSN, CCM Care Management Coordinator [facility] [facility]  Direct Phone: [phone]

## 2021-08-01 NOTE — Patient Instructions (Addendum)
Visit Information   Thank you for taking time to visit with me today. Please don't hesitate to contact me if I can be of assistance to you before our next scheduled telephone appointment.  Following are the goals we discussed today:  (Copy and paste patient goals from clinical care plan here)  Our next appointment is by telephone on 08/05/22 at 12 noon  Please call the care guide team at [phone] if you need to cancel or reschedule your appointment.   If you are experiencing a Mental Health or [place] or need someone to talk to, please call 1-800-273-TALK (toll free, 24 hour hotline)   Following is a copy of your full care plan:  Care Plan : [place] [facility]  Updates made by Lynne Logan, RN since 07/29/2021 12:00 AM     Problem: No plan of care established for management of chronic disease states (HTN, CKD, Venous Stasis Ulcer, HLD, Diastolic Dysfunction, Hyperaldosteronism)   Priority: High     Long-Range Goal: Development of plan of care for chronic disease states (HTN, CKD, Venous Stasis Ulcer, HLD, Diastolic Dysfunction)   Start Date: 07/29/2021  Expected End Date: 07/29/2022  This Visit's Progress: On track  Priority: High  Note:   Current Barriers:  Knowledge Deficits related to plan of care for management of HTN, CKD, Venous Stasis Ulcer, HLD, Diastolic Dysfunction   Chronic Disease Management support and education needs related to HTN, CKD, Venous Stasis Ulcer, HLD, Diastolic Dysfunction    RNCM Clinical Goal(s):  Patient will verbalize basic understanding of  HTN, CKD, Venous Stasis Ulcer, HLD, Diastolic Dysfunction  disease process and self health management plan as evidenced by patient will report having no disease exacerbations related to her chronic disease states listed above take all medications exactly as prescribed and will call provider for medication related questions as evidenced by patient will report missing no doses of her  prescribed medications  demonstrate Improved health management independence as evidenced by patient will report 100% adherence to her prescribed treatment plan  continue to work with RN Care Manager to address care management and care coordination needs related to  HTN, CKD, Venous Stasis Ulcer, HLD, Diastolic Dysfunction  as evidenced by adherence to CM Team Scheduled appointments demonstrate ongoing self health care management ability   as evidenced by    through collaboration with RN Care manager, provider, and care team.   Interventions: 1:1 collaboration with primary care provider regarding development and update of comprehensive plan of care as evidenced by provider attestation and co-signature Inter-disciplinary care team collaboration (see longitudinal plan of care) Evaluation of current treatment plan related to  self management and patient's adherence to plan as established by provider   Chronic Kidney Disease Interventions:  (Status:  Condition stable.  Not addressed this visit.) Long Term Goal Provided education to patient about basic CKD disease process Review of patient status, including review of consultant's reports, relevant laboratory and other test results, and medications completed. Reviewed medications with patient and discussed importance of medication adherence Educated patient on the importance to keep BP well controlled and increase water intake to 3-4 bottles per day (64 oz daily) Last practice recorded BP readings:  BP Readings from Last 3 Encounters:  01/11/21 (!) 172/90  12/09/20 138/80  12/05/20 (!) 180/84  Most recent eGFR/CrCl:  Lab Results  Component Value Date   EGFR 33 (L) 12/09/2020    No components found for: CRCL   Diabetes Interventions:  (Status:  Condition stable.  Not addressed this visit.) Long Term Goal Assessed patient's understanding of A1c goal:  <5.7 Provided education to patient about basic DM disease process Review of patient status,  including review of consultant's reports, relevant laboratory and other test results, and medications completed. Reviewed medications with patient and discussed importance of medication adherence Educated on dietary and exercise recommendations for ongoing DM management  Lab Results  Component Value Date   HGBA1C 5.1 10/17/2017   Surgery (Adrenalectomy:  (Status: New goal.) Short Term Goal Evaluation of current treatment plan related to Adrenalectomy robotic procedure scheduled for 08/01/21 assessed patient/caregiver understanding of surgical procedure   reviewed pre-operative instructions with patient/caregiver  reviewed post-operative instructions with patient/caregiver reviewed medications with patient and addressed questions Instructed patient to contact [facility] to schedule a PCP follow up  Collaborated with PCP regarding patient's planned procedure scheduled for 08/01/21 for adrenalectomy robotic procedure w/biopsy Discussed plans with patient for ongoing care management follow up and provided patient with direct contact information for care management team  Patient Goals/Self-Care Activities: Take all medications as prescribed Attend all scheduled provider appointments Call pharmacy for medication refills 3-7 days in advance of running out of medications Perform all self care activities independently  Perform IADL's (shopping, preparing meals, housekeeping, managing finances) independently call doctor for signs and symptoms of high blood pressure keep all doctor appointments take medications for blood pressure exactly as prescribed report new symptoms to your doctor  Follow Up Plan:  Telephone follow up appointment with care management team member scheduled for:  08/05/21        Consent to CCM Services: Ms. [place] was given information about Chronic Care Management services including:  CCM service includes personalized support from designated clinical staff supervised by her  physician, including individualized plan of care and coordination with other care providers 24/7 contact phone numbers for assistance for urgent and routine care needs. Service will only be billed when office clinical staff spend 20 minutes or more in a month to coordinate care. Only one practitioner may furnish and bill the service in a calendar month. The patient may stop CCM services at any time (effective at the end of the month) by phone call to the office staff. The patient will be responsible for cost sharing (co-pay) of up to 20% of the service fee (after annual deductible is met).  Patient agreed to services and verbal consent obtained.   The patient verbalized understanding of instructions, educational materials, and care plan provided today and declined offer to receive copy of patient instructions, educational materials, and care plan.

## 2021-08-03 ENCOUNTER — Telehealth: Payer: Self-pay

## 2021-08-03 ENCOUNTER — Ambulatory Visit: Payer: Medicare Other | Admitting: Nurse Practitioner

## 2021-08-03 DIAGNOSIS — I10 Essential (primary) hypertension: Secondary | ICD-10-CM

## 2021-08-03 MED ORDER — HYDROCHLOROTHIAZIDE 25 MG PO TABS: 25.0000 mg | ORAL_TABLET | Freq: Every day | ORAL | 1 refills | Status: DC

## 2021-08-03 NOTE — Telephone Encounter (Signed)
I left the pt a message I was calling her back.

## 2021-08-05 ENCOUNTER — Telehealth: Payer: Medicare Other

## 2021-08-05 ENCOUNTER — Ambulatory Visit (INDEPENDENT_AMBULATORY_CARE_PROVIDER_SITE_OTHER): Payer: Medicare Other

## 2021-08-05 DIAGNOSIS — I872 Venous insufficiency (chronic) (peripheral): Secondary | ICD-10-CM

## 2021-08-05 DIAGNOSIS — N1831 Chronic kidney disease, stage 3a: Secondary | ICD-10-CM

## 2021-08-05 DIAGNOSIS — I152 Hypertension secondary to endocrine disorders: Secondary | ICD-10-CM

## 2021-08-05 DIAGNOSIS — L97821 Non-pressure chronic ulcer of other part of left lower leg limited to breakdown of skin: Secondary | ICD-10-CM

## 2021-08-05 DIAGNOSIS — I10 Essential (primary) hypertension: Secondary | ICD-10-CM

## 2021-08-05 DIAGNOSIS — I5189 Other ill-defined heart diseases: Secondary | ICD-10-CM

## 2021-08-05 NOTE — Chronic Care Management (AMB) (Signed)
Chronic Care Management   CCM RN Visit Note  08/05/2021 Name: [name] MRN: [id number] DOB: 04-22-1961  Subjective: [name] is a 60 y.o. year old female who is a primary care patient of [name], [place]. The care management team was consulted for assistance with disease management and care coordination needs.    Engaged with patient by telephone for follow up visit in response to provider referral for case management and/or care coordination services.   Consent to Services:  The patient was given information about Chronic Care Management services, agreed to services, and gave verbal consent prior to initiation of services.  Please see initial visit note for detailed documentation.   Patient agreed to services and verbal consent obtained.   Assessment: Review of patient past medical history, allergies, medications, health status, including review of consultants reports, laboratory and other test data, was performed as part of comprehensive evaluation and provision of chronic care management services.   SDOH (Social Determinants of Health) assessments and interventions performed:    CCM Care Plan  Allergies  Allergen Reactions   Lisinopril Cough    Outpatient Encounter Medications as of 08/05/2021  Medication Sig   acetaminophen (TYLENOL) 325 MG tablet Take 650 mg by mouth every 6 (six) hours as needed for mild pain or headache.   amLODipine (NORVASC) 10 MG tablet Take 1 tablet (10 mg total) by mouth daily.   atorvastatin (LIPITOR) 40 MG tablet Take 1 tablet (40 mg total) by mouth daily.   hydrochlorothiazide (HYDRODIURIL) 25 MG tablet Take 1 tablet (25 mg total) by mouth daily.   losartan (COZAAR) 50 MG tablet Take 1 tablet (50 mg total) by mouth daily.   spironolactone (ALDACTONE) 25 MG tablet Take 25 mg by mouth daily. (Patient not taking: Reported on 07/29/2021)   No facility-administered encounter medications on file as of 08/05/2021.    Patient Active  Problem List   Diagnosis Date Noted   Adrenal adenoma 01/11/2021   Left ventricular hypertrophy 01/11/2021   Substance-induced disorder ([place]) 12/05/2020   Cocaine use disorder ([place]) 12/05/2020   Atypical angina ([place]) 10/11/2020   Stress 02/27/2019   Hypoglycemia without diagnosis of diabetes mellitus 09/06/2018   CKD (chronic kidney disease) stage 3, GFR 30-59 ml/min (HCC) 09/06/2018   Abdominal pain 09/06/2018   Chest pain 09/05/2018   Hypertensive urgency 09/05/2018   Wrist joint effusion, right 05/30/2017   Hypertension due to endocrine disorder 01/17/2017   Hyperaldosteronism ([place]) 01/17/2017   AKI (acute kidney injury) ([place]) 10/19/2015   Varicose veins of left lower extremity with complications 74/07/8785   Anemia 12/25/2014   Encounter for preventive care 11/11/2014   Tobacco abuse 76/72/0947   Diastolic dysfunction without heart failure 06/11/2013   GERD (gastroesophageal reflux disease) 06/11/2013   Severe uncontrolled hypertension 05/12/2013   Hyperlipemia 05/12/2013    Conditions to be addressed/monitored: HTN, CKD, Venous Stasis Ulcer, HLD, Diastolic Dysfunction)  Care Plan : RN Care Manager Plan of Care  Updates made by Lynne Logan, RN since 08/05/2021 12:00 AM     Problem: No plan of care established for management of chronic disease states (HTN, CKD, Venous Stasis Ulcer, HLD, Diastolic Dysfunction)   Priority: High     Long-Range Goal: Development of plan of care for chronic disease states (HTN, CKD, Venous Stasis Ulcer, HLD, Diastolic Dysfunction)   Start Date: 07/29/2021  Expected End Date: 07/29/2022  Recent Progress: On track  Priority: High  Note:   Current Barriers:  Knowledge Deficits related to plan of  care for management of HTN, CKD, Venous Stasis Ulcer, HLD, Diastolic Dysfunction, Hyperaldosteronism    Chronic Disease Management support and education needs related to HTN, CKD, Venous Stasis Ulcer, HLD, Diastolic Dysfunction,Hyperaldosteronism      RNCM Clinical Goal(s):  Patient will verbalize basic understanding of  HTN, CKD, Venous Stasis Ulcer, HLD, Diastolic Dysfunction, Hyperaldosteronism   disease process and self health management plan as evidenced by patient will report having no disease exacerbations related to her chronic disease states listed above take all medications exactly as prescribed and will call provider for medication related questions as evidenced by patient will report missing no doses of her prescribed medications  demonstrate Improved health management independence as evidenced by patient will report 100% adherence to her prescribed treatment plan  continue to work with RN Care Manager to address care management and care coordination needs related to  HTN, CKD, Venous Stasis Ulcer, HLD, Diastolic Dysfunction, Hyperaldosteronism   as evidenced by adherence to CM Team Scheduled appointments demonstrate ongoing self health care management ability   as evidenced by    through collaboration with RN Care manager, provider, and care team.   Interventions: 1:1 collaboration with primary care provider regarding development and update of comprehensive plan of care as evidenced by provider attestation and co-signature Inter-disciplinary care team collaboration (see longitudinal plan of care) Evaluation of current treatment plan related to  self management and patient's adherence to plan as established by provider   Chronic Kidney Disease Interventions:  (Status:  Condition stable.  Not addressed this visit.) Long Term Goal Provided education to patient about basic CKD disease process Review of patient status, including review of consultant's reports, relevant laboratory and other test results, and medications completed. Reviewed medications with patient and discussed importance of medication adherence Educated patient on the importance to keep BP well controlled and increase water intake to 3-4 bottles per day (64 oz  daily) Last practice recorded BP readings:  BP Readings from Last 3 Encounters:  01/11/21 (!) 172/90  12/09/20 138/80  12/05/20 (!) 180/84  Most recent eGFR/CrCl:  Lab Results  Component Value Date   EGFR 33 (L) 12/09/2020    No components found for: CRCL   Diabetes Interventions:  (Status:  Condition stable.  Not addressed this visit.) Long Term Goal Assessed patient's understanding of A1c goal:  <5.7 Provided education to patient about basic DM disease process Review of patient status, including review of consultant's reports, relevant laboratory and other test results, and medications completed. Reviewed medications with patient and discussed importance of medication adherence Educated on dietary and exercise recommendations for ongoing DM management  Lab Results  Component Value Date   HGBA1C 5.1 10/17/2017   Surgery: Adrenalectomy:  (Status: New goal.) Short Term Goal Evaluation of current treatment plan related to Adrenalectomy robotic procedure scheduled for 08/01/21 Determined patient's surgery was cancelled and rescheduled for 10/03/21 due to patient tested positive for cocaine and COVID 19 infection assessed patient/caregiver understanding of surgical procedure   reviewed pre-operative instructions with patient/caregiver  reviewed post-operative instructions with patient/caregiver reviewed medications with patient and addressed questions Discussed patient now has all BP meds on hand and states she is taking them as directed, she will monitor her BP at home and report abnormal readings and or concerns Educated patient on BP parameters and when to notify MD with abnormal BP readings  Discussed plans with patient for ongoing care management follow up and provided patient with direct contact information for care management team  Patient Goals/Self-Care Activities:  Take all medications as prescribed Attend all scheduled provider appointments Call pharmacy for medication  refills 3-7 days in advance of running out of medications Perform all self care activities independently  Perform IADL's (shopping, preparing meals, housekeeping, managing finances) independently call doctor for signs and symptoms of high blood pressure keep all doctor appointments take medications for blood pressure exactly as prescribed report new symptoms to your doctor  Follow Up Plan:  Telephone follow up appointment with care management team member scheduled for:  09/07/21        Plan:Telephone follow up appointment with care management team member scheduled for:  09/07/21  Barb Merino, RN, BSN, CCM Care Management Coordinator [place] [facility]  Direct Phone: [phone]

## 2021-08-05 NOTE — Patient Instructions (Signed)
Visit Information   Thank you for taking time to visit with me today. Please don't hesitate to contact me if I can be of assistance to you before our next scheduled telephone appointment.  Following are the goals we discussed today:  (Copy and paste patient goals from clinical care plan here)  Our next appointment is by telephone on 09/07/21 at 12:05 PM  Please call the care guide team at [phone] if you need to cancel or reschedule your appointment.   If you are experiencing a Mental Health or [place] or need someone to talk to, please call 1-800-273-TALK (toll free, 24 hour hotline)   Following is a copy of your full care plan:  Care Plan : [place] of Care  Updates made by Lynne Logan, RN since 08/05/2021 12:00 AM     Problem: No plan of care established for management of chronic disease states (HTN, CKD, Venous Stasis Ulcer, HLD, Diastolic Dysfunction)   Priority: High     Long-Range Goal: Development of plan of care for chronic disease states (HTN, CKD, Venous Stasis Ulcer, HLD, Diastolic Dysfunction)   Start Date: 07/29/2021  Expected End Date: 07/29/2022  Recent Progress: On track  Priority: High  Note:   Current Barriers:  Knowledge Deficits related to plan of care for management of HTN, CKD, Venous Stasis Ulcer, HLD, Diastolic Dysfunction, Hyperaldosteronism    Chronic Disease Management support and education needs related to HTN, CKD, Venous Stasis Ulcer, HLD, Diastolic Dysfunction,Hyperaldosteronism     RNCM Clinical Goal(s):  Patient will verbalize basic understanding of  HTN, CKD, Venous Stasis Ulcer, HLD, Diastolic Dysfunction, Hyperaldosteronism   disease process and self health management plan as evidenced by patient will report having no disease exacerbations related to her chronic disease states listed above take all medications exactly as prescribed and will call provider for medication related questions as evidenced by patient  will report missing no doses of her prescribed medications  demonstrate Improved health management independence as evidenced by patient will report 100% adherence to her prescribed treatment plan  continue to work with RN Care Manager to address care management and care coordination needs related to  HTN, CKD, Venous Stasis Ulcer, HLD, Diastolic Dysfunction, Hyperaldosteronism   as evidenced by adherence to CM Team Scheduled appointments demonstrate ongoing self health care management ability   as evidenced by    through collaboration with RN Care manager, provider, and care team.   Interventions: 1:1 collaboration with primary care provider regarding development and update of comprehensive plan of care as evidenced by provider attestation and co-signature Inter-disciplinary care team collaboration (see longitudinal plan of care) Evaluation of current treatment plan related to  self management and patient's adherence to plan as established by provider   Chronic Kidney Disease Interventions:  (Status:  Condition stable.  Not addressed this visit.) Long Term Goal Provided education to patient about basic CKD disease process Review of patient status, including review of consultant's reports, relevant laboratory and other test results, and medications completed. Reviewed medications with patient and discussed importance of medication adherence Educated patient on the importance to keep BP well controlled and increase water intake to 3-4 bottles per day (64 oz daily) Last practice recorded BP readings:  BP Readings from Last 3 Encounters:  01/11/21 (!) 172/90  12/09/20 138/80  12/05/20 (!) 180/84  Most recent eGFR/CrCl:  Lab Results  Component Value Date   EGFR 33 (L) 12/09/2020    No components found for: CRCL  Diabetes Interventions:  (Status:  Condition stable.  Not addressed this visit.) Long Term Goal Assessed patient's understanding of A1c goal:  <5.7 Provided education to patient  about basic DM disease process Review of patient status, including review of consultant's reports, relevant laboratory and other test results, and medications completed. Reviewed medications with patient and discussed importance of medication adherence Educated on dietary and exercise recommendations for ongoing DM management  Lab Results  Component Value Date   HGBA1C 5.1 10/17/2017   Surgery: Adrenalectomy:  (Status: New goal.) Short Term Goal Evaluation of current treatment plan related to Adrenalectomy robotic procedure scheduled for 08/01/21 Determined patient's surgery was cancelled and rescheduled for 10/03/21 due to patient tested positive for cocaine and COVID 19 infection assessed patient/caregiver understanding of surgical procedure   reviewed pre-operative instructions with patient/caregiver  reviewed post-operative instructions with patient/caregiver reviewed medications with patient and addressed questions Discussed patient now has all BP meds on hand and states she is taking them as directed, she will monitor her BP at home and report abnormal readings and or concerns Educated patient on BP parameters and when to notify MD with abnormal BP readings  Discussed plans with patient for ongoing care management follow up and provided patient with direct contact information for care management team  Patient Goals/Self-Care Activities: Take all medications as prescribed Attend all scheduled provider appointments Call pharmacy for medication refills 3-7 days in advance of running out of medications Perform all self care activities independently  Perform IADL's (shopping, preparing meals, housekeeping, managing finances) independently call doctor for signs and symptoms of high blood pressure keep all doctor appointments take medications for blood pressure exactly as prescribed report new symptoms to your doctor  Follow Up Plan:  Telephone follow up appointment with care management  team member scheduled for:  09/07/21        Consent to CCM Services: Ms. Lueth was given information about Chronic Care Management services including:  CCM service includes personalized support from designated clinical staff supervised by her physician, including individualized plan of care and coordination with other care providers 24/7 contact phone numbers for assistance for urgent and routine care needs. Service will only be billed when office clinical staff spend 20 minutes or more in a month to coordinate care. Only one practitioner may furnish and bill the service in a calendar month. The patient may stop CCM services at any time (effective at the end of the month) by phone call to the office staff. The patient will be responsible for cost sharing (co-pay) of up to 20% of the service fee (after annual deductible is met).  Patient agreed to services and verbal consent obtained.   The patient verbalized understanding of instructions, educational materials, and care plan provided today and declined offer to receive copy of patient instructions, educational materials, and care plan.   Telephone follow up appointment with care management team member scheduled for: 09/07/21

## 2021-08-17 ENCOUNTER — Telehealth: Payer: Medicare Other

## 2021-08-17 ENCOUNTER — Ambulatory Visit: Payer: Self-pay

## 2021-08-17 DIAGNOSIS — I152 Hypertension secondary to endocrine disorders: Secondary | ICD-10-CM

## 2021-08-17 DIAGNOSIS — I5189 Other ill-defined heart diseases: Secondary | ICD-10-CM

## 2021-08-17 DIAGNOSIS — I872 Venous insufficiency (chronic) (peripheral): Secondary | ICD-10-CM

## 2021-08-17 DIAGNOSIS — N1831 Chronic kidney disease, stage 3a: Secondary | ICD-10-CM

## 2021-08-17 DIAGNOSIS — E269 Hyperaldosteronism, unspecified: Secondary | ICD-10-CM

## 2021-08-17 DIAGNOSIS — E782 Mixed hyperlipidemia: Secondary | ICD-10-CM

## 2021-08-17 NOTE — Patient Instructions (Addendum)
Visit Information  Thank you for taking time to visit with me today. Please don't hesitate to contact me if I can be of assistance to you before our next scheduled telephone appointment.  Following are the goals we discussed today:  (Copy and paste patient goals from clinical care plan here)  Our next appointment is by telephone on 09/07/21 at 12:05 PM  Please call the care guide team at [phone] if you need to cancel or reschedule your appointment.   If you are experiencing a Mental Health or [employer] or need someone to talk to, please call 1-800-273-TALK (toll free, 24 hour hotline)   The patient verbalized understanding of instructions, educational materials, and care plan provided today and declined offer to receive copy of patient instructions, educational materials, and care plan.   Barb Merino, RN, BSN, CCM Care Management Coordinator [place] [facility]  Direct Phone: [phone]

## 2021-08-17 NOTE — Chronic Care Management (AMB) (Signed)
Chronic Care Management   CCM RN Visit Note  08/17/2021 Name: [name] MRN: [id number] DOB: 18-Jan-1961  Subjective: [name] is a 60 y.o. year old female who is a primary care patient of [name], [place]. The care management team was consulted for assistance with disease management and care coordination needs.    Engaged with patient by telephone for follow up visit in response to provider referral for case management and/or care coordination services.   Consent to Services:  The patient was given information about Chronic Care Management services, agreed to services, and gave verbal consent prior to initiation of services.  Please see initial visit note for detailed documentation.   Patient agreed to services and verbal consent obtained.   Assessment: Review of patient past medical history, allergies, medications, health status, including review of consultants reports, laboratory and other test data, was performed as part of comprehensive evaluation and provision of chronic care management services.   SDOH (Social Determinants of Health) assessments and interventions performed:  Yes, no acute challenges   CCM Care Plan  Allergies  Allergen Reactions   Lisinopril Cough    Outpatient Encounter Medications as of 08/17/2021  Medication Sig   acetaminophen (TYLENOL) 325 MG tablet Take 650 mg by mouth every 6 (six) hours as needed for mild pain or headache.   amLODipine (NORVASC) 10 MG tablet Take 1 tablet (10 mg total) by mouth daily.   atorvastatin (LIPITOR) 40 MG tablet Take 1 tablet (40 mg total) by mouth daily.   hydrochlorothiazide (HYDRODIURIL) 25 MG tablet Take 1 tablet (25 mg total) by mouth daily.   losartan (COZAAR) 50 MG tablet Take 1 tablet (50 mg total) by mouth daily.   spironolactone (ALDACTONE) 25 MG tablet Take 25 mg by mouth daily. (Patient not taking: Reported on 07/29/2021)   No facility-administered encounter medications on file as of 08/17/2021.     Patient Active Problem List   Diagnosis Date Noted   Adrenal adenoma 01/11/2021   Left ventricular hypertrophy 01/11/2021   Substance-induced disorder ([place]) 12/05/2020   Cocaine use disorder ([place]) 12/05/2020   Atypical angina ([place]) 10/11/2020   Stress 02/27/2019   Hypoglycemia without diagnosis of diabetes mellitus 09/06/2018   CKD (chronic kidney disease) stage 3, GFR 30-59 ml/min (HCC) 09/06/2018   Abdominal pain 09/06/2018   Chest pain 09/05/2018   Hypertensive urgency 09/05/2018   Wrist joint effusion, right 05/30/2017   Hypertension due to endocrine disorder 01/17/2017   Hyperaldosteronism ([place]) 01/17/2017   AKI (acute kidney injury) ([place]) 10/19/2015   Varicose veins of left lower extremity with complications 34/19/6222   Anemia 12/25/2014   Encounter for preventive care 11/11/2014   Tobacco abuse 97/98/9211   Diastolic dysfunction without heart failure 06/11/2013   GERD (gastroesophageal reflux disease) 06/11/2013   Severe uncontrolled hypertension 05/12/2013   Hyperlipemia 05/12/2013    Conditions to be addressed/monitored: HTN, CKD, Venous Stasis Ulcer, HLD, Diastolic Dysfunction, Hyperaldosteronism   Care Plan : RN Care Manager Plan of Care  Updates made by Lynne Logan, RN since 08/17/2021 12:00 AM     Problem: No plan of care established for management of chronic disease states (HTN, CKD, Venous Stasis Ulcer, HLD, Diastolic Dysfunction)   Priority: High     Long-Range Goal: Development of plan of care for chronic disease states (HTN, CKD, Venous Stasis Ulcer, HLD, Diastolic Dysfunction)   Start Date: 07/29/2021  Expected End Date: 07/29/2022  Recent Progress: On track  Priority: High  Note:   Current Barriers:  Knowledge Deficits related to plan of care for management of HTN, CKD, Venous Stasis Ulcer, HLD, Diastolic Dysfunction, Hyperaldosteronism    Chronic Disease Management support and education needs related to HTN, CKD, Venous Stasis Ulcer, HLD,  Diastolic Dysfunction,Hyperaldosteronism     RNCM Clinical Goal(s):  Patient will verbalize basic understanding of  HTN, CKD, Venous Stasis Ulcer, HLD, Diastolic Dysfunction, Hyperaldosteronism   disease process and self health management plan as evidenced by patient will report having no disease exacerbations related to her chronic disease states listed above take all medications exactly as prescribed and will call provider for medication related questions as evidenced by patient will report missing no doses of her prescribed medications  demonstrate Improved health management independence as evidenced by patient will report 100% adherence to her prescribed treatment plan  continue to work with RN Care Manager to address care management and care coordination needs related to  HTN, CKD, Venous Stasis Ulcer, HLD, Diastolic Dysfunction, Hyperaldosteronism   as evidenced by adherence to CM Team Scheduled appointments demonstrate ongoing self health care management ability   as evidenced by    through collaboration with RN Care manager, provider, and care team.   Interventions: 1:1 collaboration with primary care provider regarding development and update of comprehensive plan of care as evidenced by provider attestation and co-signature Inter-disciplinary care team collaboration (see longitudinal plan of care) Evaluation of current treatment plan related to  self management and patient's adherence to plan as established by provider   Chronic Kidney Disease Interventions:  (Status:  Condition stable.  Not addressed this visit.) Long Term Goal Provided education to patient about basic CKD disease process Review of patient status, including review of consultant's reports, relevant laboratory and other test results, and medications completed. Reviewed medications with patient and discussed importance of medication adherence Educated patient on the importance to keep BP well controlled and increase water  intake to 3-4 bottles per day (64 oz daily) Last practice recorded BP readings:  BP Readings from Last 3 Encounters:  01/11/21 (!) 172/90  12/09/20 138/80  12/05/20 (!) 180/84  Most recent eGFR/CrCl:  Lab Results  Component Value Date   EGFR 33 (L) 12/09/2020    No components found for: CRCL   Diabetes Interventions:  (Status:  Condition stable.  Not addressed this visit.) Long Term Goal Assessed patient's understanding of A1c goal:  <5.7 Provided education to patient about basic DM disease process Review of patient status, including review of consultant's reports, relevant laboratory and other test results, and medications completed. Reviewed medications with patient and discussed importance of medication adherence Educated on dietary and exercise recommendations for ongoing DM management  Lab Results  Component Value Date   HGBA1C 5.1 10/17/2017   Surgery: Adrenalectomy:  (Status: New goal.) Short Term Goal Evaluation of current treatment plan related to Adrenalectomy robotic procedure scheduled for 08/01/21 Determined patient's surgery was cancelled and rescheduled for 10/03/21 due to patient tested positive for cocaine and COVID 19 infection assessed patient/caregiver understanding of surgical procedure   reviewed pre-operative instructions with patient/caregiver  reviewed post-operative instructions with patient/caregiver reviewed medications with patient and addressed questions Discussed patient now has all BP meds on hand and states she is taking them as directed, she will monitor her BP at home and report abnormal readings and or concerns Educated patient on BP parameters and when to notify MD with abnormal BP readings  Discussed plans with patient for ongoing care management follow up and provided patient with direct contact information for care  management team   COVID 19 infection: (Status:  New goal.)  Short Term Goal Evaluation of current treatment plan related to   COVID 19 infection , self-management and patient's adherence to plan as established by provider Determined patient has made a full recovery from the COVID 19 infection, she has returned to her part time job Reviewed medications with patient and discussed importance of medication adherence Discussed having patient continue to self monitor BP at home and record readings, patient will provide RN CM with weekly BP readings following her PCP follow up scheduled for 08/23/20 @12 :35 PM Discussed plans with patient for ongoing care management follow up and provided patient with direct contact information for care management team   Patient Goals/Self-Care Activities: Take all medications as prescribed Attend all scheduled provider appointments Call pharmacy for medication refills 3-7 days in advance of running out of medications Perform all self care activities independently  Perform IADL's (shopping, preparing meals, housekeeping, managing finances) independently call doctor for signs and symptoms of high blood pressure keep all doctor appointments take medications for blood pressure exactly as prescribed report new symptoms to your doctor  Follow Up Plan:  Telephone follow up appointment with care management team member scheduled for:  09/07/21       Plan:Telephone follow up appointment with care management team member scheduled for:  09/07/21  Barb Merino, RN, BSN, CCM Care Management Coordinator [place] [facility]  Direct Phone: [phone]

## 2021-08-20 DIAGNOSIS — I152 Hypertension secondary to endocrine disorders: Secondary | ICD-10-CM | POA: Diagnosis not present

## 2021-08-20 DIAGNOSIS — N1831 Chronic kidney disease, stage 3a: Secondary | ICD-10-CM | POA: Diagnosis not present

## 2021-08-20 DIAGNOSIS — E782 Mixed hyperlipidemia: Secondary | ICD-10-CM

## 2021-08-20 DIAGNOSIS — I10 Essential (primary) hypertension: Secondary | ICD-10-CM | POA: Diagnosis not present

## 2021-08-23 ENCOUNTER — Other Ambulatory Visit: Payer: Self-pay

## 2021-08-23 ENCOUNTER — Encounter: Payer: Self-pay | Admitting: Nurse Practitioner

## 2021-08-23 ENCOUNTER — Ambulatory Visit (INDEPENDENT_AMBULATORY_CARE_PROVIDER_SITE_OTHER): Payer: Medicare Other | Admitting: Nurse Practitioner

## 2021-08-23 VITALS — BP 150/80 | HR 87 | Temp 99.5°F | Ht 68.0 in | Wt 190.6 lb

## 2021-08-23 DIAGNOSIS — N1832 Chronic kidney disease, stage 3b: Secondary | ICD-10-CM

## 2021-08-23 DIAGNOSIS — E782 Mixed hyperlipidemia: Secondary | ICD-10-CM | POA: Diagnosis not present

## 2021-08-23 DIAGNOSIS — Z720 Tobacco use: Secondary | ICD-10-CM | POA: Diagnosis not present

## 2021-08-23 DIAGNOSIS — M79605 Pain in left leg: Secondary | ICD-10-CM | POA: Diagnosis not present

## 2021-08-23 DIAGNOSIS — M79604 Pain in right leg: Secondary | ICD-10-CM | POA: Diagnosis not present

## 2021-08-23 DIAGNOSIS — D3502 Benign neoplasm of left adrenal gland: Secondary | ICD-10-CM | POA: Diagnosis not present

## 2021-08-23 DIAGNOSIS — I10 Essential (primary) hypertension: Secondary | ICD-10-CM | POA: Diagnosis not present

## 2021-08-23 DIAGNOSIS — I872 Venous insufficiency (chronic) (peripheral): Secondary | ICD-10-CM | POA: Diagnosis not present

## 2021-08-23 DIAGNOSIS — I129 Hypertensive chronic kidney disease with stage 1 through stage 4 chronic kidney disease, or unspecified chronic kidney disease: Secondary | ICD-10-CM

## 2021-08-23 DIAGNOSIS — F1410 Cocaine abuse, uncomplicated: Secondary | ICD-10-CM

## 2021-08-23 MED ORDER — ATORVASTATIN CALCIUM 40 MG PO TABS: 40.0000 mg | ORAL_TABLET | Freq: Every day | ORAL | 1 refills | Status: DC

## 2021-08-23 NOTE — Patient Instructions (Signed)

## 2021-08-23 NOTE — Progress Notes (Signed)
I,Yamilka J Llittleton,acting as a Education administrator for [employer], FNP.,have documented all relevant documentation on the behalf of [name], FNP,as directed by  [name], FNP while in the presence of [name], [place].   This visit occurred during the SARS-CoV-2 public health emergency. Safety protocols were in place, including screening questions prior to the visit, additional usage of staff PPE, and extensive cleaning of exam room while observing appropriate contact time as indicated for disinfecting solutions.  Subjective:     Patient ID: [name] , female    DOB: Aug 03, 1961 , 61 y.o.   MRN: [id number]   Chief Complaint  Patient presents with   Hypertension    HPI  Patient presents today for a blood pressure f/u. She is to have surgery to her left kidney to remove a mass with Dr. Tasia Catchings at [facility].  She was prescreened for covid for her surgery and was positive. She did not have any symptoms.  She reports once the mass is removed from her adrenal glands her blood pressure should improve.  She reports she has been around friends who does cocaine and she feels her positive results to cocaine is why she tested positive.   Wt Readings from Last 3 Encounters: 08/23/21 : 190 lb 9.6 oz (86.5 kg) 01/11/21 : 192 lb 9.6 oz (87.4 kg) 12/09/20 : 192 lb 3.2 oz (87.2 kg)    Hypertension This is a chronic problem. The current episode started more than 1 year ago. The problem has been gradually worsening since onset. The problem is uncontrolled. Pertinent negatives include no anxiety, blurred vision, chest pain, headaches, palpitations or peripheral edema. Risk factors for coronary artery disease include obesity and sedentary lifestyle. Past treatments include angiotensin blockers, diuretics and calcium channel blockers. There are no compliance problems.  There is no history of angina or kidney disease. There is no history of chronic renal disease or sleep apnea.    Past Medical History:  Diagnosis  Date   Adrenal adenoma 01/11/2021   Anemia    CHF (congestive heart failure) (HCC)    Chronic bronchitis ([place])    "probably once/yr" (6/56/8127)   Diastolic heart failure    Dyslipidemia    pt denies this hx on 05/12/2013   Exertional shortness of breath    GERD (gastroesophageal reflux disease)    Hypertension    Hypokalemia    Left ventricular hypertrophy 01/11/2021   Venous stasis ulcers ([place])      Family History  Problem Relation Age of Onset   Hypertension Mother    Kidney failure Mother    Heart attack Father    Hypertension Sister    Diabetes Sister    Hypertension Brother    Diabetes Sister      Current Outpatient Medications:    acetaminophen (TYLENOL) 325 MG tablet, Take 650 mg by mouth every 6 (six) hours as needed for mild pain or headache., Disp: , Rfl:    amLODipine (NORVASC) 10 MG tablet, Take 1 tablet (10 mg total) by mouth daily., Disp: 90 tablet, Rfl: 3   aspirin 81 MG chewable tablet, Chew 81 mg by mouth daily., Disp: , Rfl:    FARXIGA 10 MG TABS tablet, Take 10 mg by mouth daily., Disp: , Rfl:    olmesartan (BENICAR) 40 MG tablet, Take 40 mg by mouth daily., Disp: , Rfl:    spironolactone (ALDACTONE) 25 MG tablet, Take 25 mg by mouth daily., Disp: , Rfl:    atorvastatin (LIPITOR) 40 MG tablet, Take 1  tablet (40 mg total) by mouth daily., Disp: 90 tablet, Rfl: 1   losartan (COZAAR) 50 MG tablet, Take 1 tablet (50 mg total) by mouth daily. (Patient not taking: Reported on 08/23/2021), Disp: 90 tablet, Rfl: 1   Allergies  Allergen Reactions   Lisinopril Cough     Review of Systems  Eyes:  Negative for blurred vision.  Cardiovascular:  Negative for chest pain and palpitations.  Neurological:  Negative for headaches.    Today's Vitals   08/23/21 1248  BP: (!) 150/80  Pulse: 87  Temp: 99.5 F (37.5 C)  Weight: 190 lb 9.6 oz (86.5 kg)  Height: 5' 8"  (1.727 m)  PainSc: 9   PainLoc: Leg   Body mass index is 28.98 kg/m.   Objective:  Physical  Exam Vitals reviewed.  Constitutional:      General: She is not in acute distress.    Appearance: Normal appearance. She is obese.  Cardiovascular:     Rate and Rhythm: Normal rate and regular rhythm.     Pulses: Normal pulses.     Heart sounds: Normal heart sounds.  Pulmonary:     Effort: Pulmonary effort is normal. No respiratory distress.     Breath sounds: Normal breath sounds. No wheezing.  Abdominal:     Tenderness: There is no right CVA tenderness or left CVA tenderness.  Musculoskeletal:        General: Normal range of motion.     Right lower leg: Edema (trace) present.     Left lower leg: Edema (trace) present.  Skin:    General: Skin is warm and dry.     Capillary Refill: Capillary refill takes less than 2 seconds.     Comments: Bilateral lower extremities are darkened, no open areas  Neurological:     General: No focal deficit present.     Mental Status: She is alert and oriented to person, place, and time.     Cranial Nerves: No cranial nerve deficit.     Motor: No weakness.  Psychiatric:        Mood and Affect: Mood normal.        Behavior: Behavior normal.        Thought Content: Thought content normal.        Judgment: Judgment normal.        Assessment And Plan:     1. Severe uncontrolled hypertension Comments: Blood pressure is better controlled today, encouraged to quit smoking and stay hydrated. May need to add losatan back to her blood pressure regimen, not taking - Hemoglobin A1c  2. Mixed hyperlipidemia Comments: Taking statin no recent lipid panel. Tolerating statin well. Continue low fat diet.  - atorvastatin (LIPITOR) 40 MG tablet; Take 1 tablet (40 mg total) by mouth daily.  Dispense: 90 tablet; Refill: 1 - Hemoglobin A1c - CMP14+EGFR - Lipid panel  3. Pain in both lower extremities Comments: This has been persistent and my be related to worsening venous insufficiency - Vitamin B12 - Ambulatory referral to Vascular Surgery - Hemoglobin  A1c  4. Stage 3b chronic kidney disease ([place]) Comments: Continue follow up with Nephrology  5. Venous insufficiency of both lower extremities Comments: Reviewing her chart from vascular in 2016, she has not followed up since then. Will refer again as she continues to have bilateral leg pain and tingling  6. Adenoma of left adrenal gland Comments: She is to have this removed later this month, had to be postponed due to having cocaine in her  system, reports obtained with "second hand smoke"  7. Cocaine use disorder (HCC) Comments: Declines using cocaine however was in her blood stream with recent labs prior to her adrenal mass removal.   8. Tobacco abuse Comments: Will follow up on her low dose CT scan has a 36 year pack history of smoking     Patient was given opportunity to ask questions. Patient verbalized understanding of the plan and was able to repeat key elements of the plan. All questions were answered to their satisfaction.  [name], FNP   I, [name], FNP, have reviewed all documentation for this visit. The documentation on 08/23/21 for the exam, diagnosis, procedures, and orders are all accurate and complete.   IF YOU HAVE BEEN REFERRED TO A SPECIALIST, IT MAY TAKE 1-2 WEEKS TO SCHEDULE/PROCESS THE REFERRAL. IF YOU HAVE NOT HEARD FROM US/SPECIALIST IN TWO WEEKS, PLEASE GIVE [place] A CALL AT [phone].   THE PATIENT IS ENCOURAGED TO PRACTICE SOCIAL DISTANCING DUE TO THE COVID-19 PANDEMIC.

## 2021-08-24 LAB — LIPID PANEL
Chol/HDL Ratio: 3.4 ratio (ref 0.0–4.4)
Cholesterol, Total: 189 mg/dL (ref 100–199)
HDL: 55 mg/dL (ref >39)
LDL Chol Calc (NIH): 101 mg/dL — ABNORMAL HIGH (ref 0–99)
Triglycerides: 193 mg/dL — ABNORMAL HIGH (ref 0–149)
VLDL Cholesterol Cal: 33 mg/dL (ref 5–40)

## 2021-08-24 LAB — CMP14+EGFR
ALT: 12 IU/L (ref 0–32)
AST: 17 IU/L (ref 0–40)
Albumin/Globulin Ratio: 1.3 (ref 1.2–2.2)
Albumin: 4.2 g/dL (ref 3.8–4.9)
Alkaline Phosphatase: 77 IU/L (ref 44–121)
BUN/Creatinine Ratio: 17 (ref 12–28)
BUN: 36 mg/dL — ABNORMAL HIGH (ref 8–27)
Bilirubin Total: 0.3 mg/dL (ref 0.0–1.2)
CO2: 23 mmol/L (ref 20–29)
Calcium: 9 mg/dL (ref 8.7–10.3)
Chloride: 106 mmol/L (ref 96–106)
Creatinine, Ser: 2.1 mg/dL — ABNORMAL HIGH (ref 0.57–1.00)
Globulin, Total: 3.2 g/dL (ref 1.5–4.5)
Glucose: 83 mg/dL (ref 70–99)
Potassium: 4.8 mmol/L (ref 3.5–5.2)
Sodium: 145 mmol/L — ABNORMAL HIGH (ref 134–144)
Total Protein: 7.4 g/dL (ref 6.0–8.5)
eGFR: 26 mL/min/{1.73_m2} — ABNORMAL LOW (ref >59)

## 2021-08-24 LAB — HEMOGLOBIN A1C
Est. average glucose Bld gHb Est-mCnc: 94 mg/dL
Hgb A1c MFr Bld: 4.9 % (ref 4.8–5.6)

## 2021-08-24 LAB — VITAMIN B12: Vitamin B-12: 582 pg/mL (ref 232–1245)

## 2021-09-06 ENCOUNTER — Other Ambulatory Visit: Payer: Self-pay

## 2021-09-06 DIAGNOSIS — I739 Peripheral vascular disease, unspecified: Secondary | ICD-10-CM

## 2021-09-07 ENCOUNTER — Telehealth: Payer: Medicare Other

## 2021-09-07 ENCOUNTER — Telehealth: Payer: Self-pay

## 2021-09-07 NOTE — Telephone Encounter (Signed)
°  Care Management   Follow Up Note   09/07/2021 Name: [name] MRN: [id number] DOB: 1960-10-25   Referred by: [name], FNP Reason for referral : Chronic Care Management (RN CM Follow up call )   An unsuccessful telephone outreach was attempted today. The patient was referred to the case management team for assistance with care management and care coordination.   Follow Up Plan: A HIPPA compliant phone message was left for the patient providing contact information and requesting a return call.   Barb Merino, RN, BSN, CCM Care Management Coordinator [facility]  Direct Phone: [phone]

## 2021-09-08 ENCOUNTER — Ambulatory Visit (INDEPENDENT_AMBULATORY_CARE_PROVIDER_SITE_OTHER): Payer: Commercial Managed Care - HMO

## 2021-09-08 VITALS — Ht 68.0 in | Wt 198.0 lb

## 2021-09-08 DIAGNOSIS — Z0000 Encounter for general adult medical examination without abnormal findings: Secondary | ICD-10-CM

## 2021-09-08 NOTE — Patient Instructions (Signed)
[name] , Thank you for taking time to come for your Medicare Wellness Visit. I appreciate your ongoing commitment to your health goals. Please review the following plan we discussed and let me know if I can assist you in the future.   Screening recommendations/referrals: Colonoscopy: decline Mammogram: patient to schedule Bone Density: n/a Recommended yearly ophthalmology/optometry visit for glaucoma screening and checkup Recommended yearly dental visit for hygiene and checkup  Vaccinations: Influenza vaccine: completed 05/17/2021 Pneumococcal vaccine: due Tdap vaccine: due Shingles vaccine: discussed  Covid-19:  08/02/2020, 05/24/2020  Advanced directives: Advance directive discussed with you today. .  Conditions/risks identified: smoking  Next appointment: Follow up in one year for your annual wellness visit.   Preventive Care 40-64 Years, Female Preventive care refers to lifestyle choices and visits with your health care provider that can promote health and wellness. What does preventive care include? A yearly physical exam. This is also called an annual well check. Dental exams once or twice a year. Routine eye exams. Ask your health care provider how often you should have your eyes checked. Personal lifestyle choices, including: Daily care of your teeth and gums. Regular physical activity. Eating a healthy diet. Avoiding tobacco and drug use. Limiting alcohol use. Practicing safe sex. Taking low-dose aspirin daily starting at age 61. Taking vitamin and mineral supplements as recommended by your health care provider. What happens during an annual well check? The services and screenings done by your health care provider during your annual well check will depend on your age, overall health, lifestyle risk factors, and family history of disease. Counseling  Your health care provider may ask you questions about your: Alcohol use. Tobacco use. Drug use. Emotional  well-being. Home and relationship well-being. Sexual activity. Eating habits. Work and work Statistician. Method of birth control. Menstrual cycle. Pregnancy history. Screening  You may have the following tests or measurements: Height, weight, and BMI. Blood pressure. Lipid and cholesterol levels. These may be checked every 5 years, or more frequently if you are over 36 years old. Skin check. Lung cancer screening. You may have this screening every year starting at age 28 if you have a 30-pack-year history of smoking and currently smoke or have quit within the past 15 years. Fecal occult blood test (FOBT) of the stool. You may have this test every year starting at age 28. Flexible sigmoidoscopy or colonoscopy. You may have a sigmoidoscopy every 5 years or a colonoscopy every 10 years starting at age 27. Hepatitis C blood test. Hepatitis B blood test. Sexually transmitted disease (STD) testing. Diabetes screening. This is done by checking your blood sugar (glucose) after you have not eaten for a while (fasting). You may have this done every 1-3 years. Mammogram. This may be done every 1-2 years. Talk to your health care provider about when you should start having regular mammograms. This may depend on whether you have a family history of breast cancer. BRCA-related cancer screening. This may be done if you have a family history of breast, ovarian, tubal, or peritoneal cancers. Pelvic exam and Pap test. This may be done every 3 years starting at age 43. Starting at age 40, this may be done every 5 years if you have a Pap test in combination with an HPV test. Bone density scan. This is done to screen for osteoporosis. You may have this scan if you are at high risk for osteoporosis. Discuss your test results, treatment options, and if necessary, the need for more tests with your  health care provider. Vaccines  Your health care provider may recommend certain vaccines, such as: Influenza  vaccine. This is recommended every year. Tetanus, diphtheria, and acellular pertussis (Tdap, Td) vaccine. You may need a Td booster every 10 years. Zoster vaccine. You may need this after age 59. Pneumococcal 13-valent conjugate (PCV13) vaccine. You may need this if you have certain conditions and were not previously vaccinated. Pneumococcal polysaccharide (PPSV23) vaccine. You may need one or two doses if you smoke cigarettes or if you have certain conditions. Talk to your health care provider about which screenings and vaccines you need and how often you need them. This information is not intended to replace advice given to you by your health care provider. Make sure you discuss any questions you have with your health care provider. Document Released: 09/03/2015 Document Revised: 04/26/2016 Document Reviewed: 06/08/2015 Elsevier Interactive Patient Education  2017 [employer] [place] Prevention in the Home Falls can cause injuries. They can happen to people of all ages. There are many things you can do to make your home safe and to help prevent falls. What can I do on the outside of my home? Regularly fix the edges of walkways and driveways and fix any cracks. Remove anything that might make you trip as you walk through a door, such as a raised step or threshold. Trim any bushes or trees on the path to your home. Use bright outdoor lighting. Clear any walking paths of anything that might make someone trip, such as rocks or tools. Regularly check to see if handrails are loose or broken. Make sure that both sides of any steps have handrails. Any raised decks and porches should have guardrails on the edges. Have any leaves, snow, or ice cleared regularly. Use sand or salt on walking paths during winter. Clean up any spills in your garage right away. This includes oil or grease spills. What can I do in the bathroom? Use night lights. Install grab bars by the toilet and in the tub and  shower. Do not use towel bars as grab bars. Use non-skid mats or decals in the tub or shower. If you need to sit down in the shower, use a plastic, non-slip stool. Keep the floor dry. Clean up any water that spills on the floor as soon as it happens. Remove soap buildup in the tub or shower regularly. Attach bath mats securely with double-sided non-slip rug tape. Do not have throw rugs and other things on the floor that can make you trip. What can I do in the bedroom? Use night lights. Make sure that you have a light by your bed that is easy to reach. Do not use any sheets or blankets that are too big for your bed. They should not hang down onto the floor. Have a firm chair that has side arms. You can use this for support while you get dressed. Do not have throw rugs and other things on the floor that can make you trip. What can I do in the kitchen? Clean up any spills right away. Avoid walking on wet floors. Keep items that you use a lot in easy-to-reach places. If you need to reach something above you, use a strong step stool that has a grab bar. Keep electrical cords out of the way. Do not use floor polish or wax that makes floors slippery. If you must use wax, use non-skid floor wax. Do not have throw rugs and other things on the floor that  can make you trip. What can I do with my stairs? Do not leave any items on the stairs. Make sure that there are handrails on both sides of the stairs and use them. Fix handrails that are broken or loose. Make sure that handrails are as long as the stairways. Check any carpeting to make sure that it is firmly attached to the stairs. Fix any carpet that is loose or worn. Avoid having throw rugs at the top or bottom of the stairs. If you do have throw rugs, attach them to the floor with carpet tape. Make sure that you have a light switch at the top of the stairs and the bottom of the stairs. If you do not have them, ask someone to add them for  you. What else can I do to help prevent falls? Wear shoes that: Do not have high heels. Have rubber bottoms. Are comfortable and fit you well. Are closed at the toe. Do not wear sandals. If you use a stepladder: Make sure that it is fully opened. Do not climb a closed stepladder. Make sure that both sides of the stepladder are locked into place. Ask someone to hold it for you, if possible. Clearly mark and make sure that you can see: Any grab bars or handrails. First and last steps. Where the edge of each step is. Use tools that help you move around (mobility aids) if they are needed. These include: Canes. Walkers. Scooters. Crutches. Turn on the lights when you go into a dark area. Replace any light bulbs as soon as they burn out. Set up your furniture so you have a clear path. Avoid moving your furniture around. If any of your floors are uneven, fix them. If there are any pets around you, be aware of where they are. Review your medicines with your doctor. Some medicines can make you feel dizzy. This can increase your chance of falling. Ask your doctor what other things that you can do to help prevent falls. This information is not intended to replace advice given to you by your health care provider. Make sure you discuss any questions you have with your health care provider. Document Released: 06/03/2009 Document Revised: 01/13/2016 Document Reviewed: 09/11/2014 Elsevier Interactive Patient Education  2017 [employer].

## 2021-09-08 NOTE — Progress Notes (Signed)
I connected with [name] today by telephone and verified that I am speaking with the correct person using two identifiers. Location patient: home Location provider: work Persons participating in the virtual visit: Amiya, Escamilla LPN.   I discussed the limitations, risks, security and privacy concerns of performing an evaluation and management service by telephone and the availability of in person appointments. I also discussed with the patient that there may be a patient responsible charge related to this service. The patient expressed understanding and verbally consented to this telephonic visit.    Interactive audio and video telecommunications were attempted between this provider and patient, however failed, due to patient having technical difficulties OR patient did not have access to video capability.  We continued and completed visit with audio only.     Vital signs may be patient reported or missing.  Subjective:   [name] is a 61 y.o. female who presents for Medicare Annual (Subsequent) preventive examination.  Review of Systems     Cardiac Risk Factors include: dyslipidemia;hypertension;obesity (BMI >30kg/m2)     Objective:    Today's Vitals   09/08/21 1521  Weight: 198 lb (89.8 kg)  Height: 5\' 8"  (1.727 m)   Body mass index is 30.11 kg/m.  Advanced Directives 09/08/2021 12/04/2020 11/17/2020 09/05/2018 09/05/2018 10/17/2017 05/30/2017  Does Patient Have a Medical Advance Directive? No No No No No No No  Would patient like information on creating a medical advance directive? - - - No - Patient declined No - Patient declined Yes (MAU/Ambulatory/Procedural Areas - Information given) No - Patient declined  Pre-existing out of facility DNR order (yellow form or pink MOST form) - - - - - - -    Current Medications (verified) Outpatient Encounter Medications as of 09/08/2021  Medication Sig   acetaminophen (TYLENOL) 325 MG tablet Take 650 mg by  mouth every 6 (six) hours as needed for mild pain or headache.   amLODipine (NORVASC) 10 MG tablet Take 1 tablet (10 mg total) by mouth daily.   aspirin 81 MG chewable tablet Chew 81 mg by mouth daily.   atorvastatin (LIPITOR) 40 MG tablet Take 1 tablet (40 mg total) by mouth daily.   FARXIGA 10 MG TABS tablet Take 10 mg by mouth daily.   spironolactone (ALDACTONE) 25 MG tablet Take 25 mg by mouth daily.   losartan (COZAAR) 50 MG tablet Take 1 tablet (50 mg total) by mouth daily. (Patient not taking: Reported on 08/23/2021)   olmesartan (BENICAR) 40 MG tablet Take 40 mg by mouth daily. (Patient not taking: Reported on 09/08/2021)   No facility-administered encounter medications on file as of 09/08/2021.    Allergies (verified) Lisinopril   History: Past Medical History:  Diagnosis Date   Adrenal adenoma 01/11/2021   Anemia    CHF (congestive heart failure) (HCC)    Chronic bronchitis ([place])    "probably once/yr" (4/97/0263)   Diastolic heart failure    Dyslipidemia    pt denies this hx on 05/12/2013   Exertional shortness of breath    GERD (gastroesophageal reflux disease)    Hypertension    Hypokalemia    Left ventricular hypertrophy 01/11/2021   Venous stasis ulcers (HCC)    Past Surgical History:  Procedure Laterality Date   IR US GUIDE VASC ACCESS RIGHT  02/27/2017   IR VENOGRAM ADRENAL BI  02/27/2017   IR VENOGRAM HEPATIC WO HEMODYNAMIC EVALUATION  02/27/2017   IR VENOGRAM RENAL BI  02/27/2017   IR VENOUS SAMPLING  02/27/2017   IR VENOUS SAMPLING  02/27/2017   VAGINAL HYSTERECTOMY  17's   Family History  Problem Relation Age of Onset   Hypertension Mother    Kidney failure Mother    Heart attack Father    Hypertension Sister    Diabetes Sister    Hypertension Brother    Diabetes Sister    Social History   Socioeconomic History   Marital status: Single    Spouse name: Not on file   Number of children: Not on file   Years of education: Not on file   Highest  education level: Not on file  Occupational History   Occupation: disability  Tobacco Use   Smoking status: Every Day    Packs/day: 1.00    Years: 36.00    Pack years: 36.00    Types: Cigarettes   Smokeless tobacco: Never   Tobacco comments:    trying to cut back, 3/29 - only had one cigarette today  Vaping Use   Vaping Use: Never used  Substance and Sexual Activity   Alcohol use: Yes    Alcohol/week: 7.0 standard drinks    Types: 7 Cans of beer per week    Comment: Beer once a week.   Drug use: No    Types: Cocaine    Comment: 05/12/2013 "tried it a few days ago; didn't like it"   Sexual activity: Not Currently  Other Topics Concern   Not on file  Social History Narrative   Not on file   Social Determinants of Health   Financial Resource Strain: Low Risk    Difficulty of Paying Living Expenses: Not hard at all  Food Insecurity: No Food Insecurity   Worried About Charity fundraiser in the Last Year: Never true   [place] in the Last Year: Never true  Transportation Needs: No Transportation Needs   Lack of Transportation (Medical): No   Lack of Transportation (Non-Medical): No  Physical Activity: Inactive   Days of Exercise per Week: 0 days   Minutes of Exercise per Session: 0 min  Stress: No Stress Concern Present   Feeling of Stress : Not at all  Social Connections: Not on file    Tobacco Counseling Ready to quit: Yes Counseling given: Not Answered Tobacco comments: trying to cut back, 3/29 - only had one cigarette today   Clinical Intake:  Pre-visit preparation completed: Yes  Pain : No/denies pain     Nutritional Status: BMI > 30  Obese Nutritional Risks: None Diabetes: No  How often do you need to have someone help you when you read instructions, pamphlets, or other written materials from your doctor or pharmacy?: 1 - Never What is the last grade level you completed in school?: 69yrs college  Diabetic? no  Interpreter Needed?:  No  Information entered by :: NAllen LPN   Activities of Daily Living In your present state of health, do you have any difficulty performing the following activities: 09/08/2021 11/17/2020  Hearing? N N  Vision? Y Y  Comment vision a little blurry blurriness at times  Difficulty concentrating or making decisions? N N  Walking or climbing stairs? N Y  Dressing or bathing? N N  Doing errands, shopping? N N  Preparing Food and eating ? N N  Using the Toilet? N N  In the past six months, have you accidently leaked urine? N N  Do you have problems with loss of bowel control? N N  Managing your Medications?  N N  Managing your Finances? N N  Housekeeping or managing your Housekeeping? N -  Some recent data might be hidden    Patient Care Team: [name], FNP as PCP - General ([place]) Skeet Latch, MD as PCP - Cardiology (Cardiology) Christin Fudge, MD (Inactive) as Consulting Physician (Surgery) Rex Kras Claudette Stapler, RN as Donna Management Little, Claudette Stapler, RN as Case Manager  Indicate any recent Medical Services you may have received from other than [facility] providers in the past year (date may be approximate).     Assessment:   This is a routine wellness examination for [place].  Hearing/Vision screen No results found.  Dietary issues and exercise activities discussed: Current Exercise Habits: The patient has a physically strenuous job, but has no regular exercise apart from work.   Goals Addressed             This Visit's Progress    Patient Stated       09/08/2021, wants to get better       Depression Screen PHQ 2/9 Scores 09/08/2021 11/17/2020 10/11/2020 02/27/2019 10/17/2017 05/30/2017 03/12/2017  PHQ - 2 Score 0 1 0 0 0 0 0  Exception Documentation - - - - - - -    Fall Risk Fall Risk  09/08/2021 11/17/2020 02/27/2019 10/17/2017 05/30/2017  Falls in the past year? 0 0 0 No No  Number falls in past yr: - - - - -  Injury with Fall? - - -  - -  Risk for fall due to : Medication side effect Medication side effect - - -  Follow up Falls evaluation completed;Education provided;Falls prevention discussed Falls evaluation completed;Education provided;Falls prevention discussed - - -    FALL RISK PREVENTION PERTAINING TO THE HOME:  Any stairs in or around the home? No  If so, are there any without handrails?  N/a Home free of loose throw rugs in walkways, pet beds, electrical cords, etc? Yes  Adequate lighting in your home to reduce risk of falls? Yes   ASSISTIVE DEVICES UTILIZED TO PREVENT FALLS:  Life alert? No  Use of a cane, walker or w/c? No  Grab bars in the bathroom? Yes  Shower chair or bench in shower? No  Elevated toilet seat or a handicapped toilet? Yes   TIMED UP AND GO:  Was the test performed? No .      Cognitive Function:     6CIT Screen 09/08/2021 11/17/2020  What Year? 0 points 0 points  What month? 0 points 0 points  What time? 0 points 0 points  Count back from 20 0 points 0 points  Months in reverse 0 points 0 points  Repeat phrase 6 points 2 points  Total Score 6 2    Immunizations Immunization History  Administered Date(s) Administered   PFIZER(Purple Top)SARS-COV-2 Vaccination 05/24/2020, 08/02/2020    TDAP status: Due, Education has been provided regarding the importance of this vaccine. Advised may receive this vaccine at local pharmacy or Health Dept. Aware to provide a copy of the vaccination record if obtained from local pharmacy or Health Dept. Verbalized acceptance and understanding.  Flu Vaccine status: Up to date  Pneumococcal vaccine status: Up to date  Covid-19 vaccine status: Completed vaccines  Qualifies for Shingles Vaccine? Yes   Zostavax completed No   Shingrix Completed?: No.    Education has been provided regarding the importance of this vaccine. Patient has been advised to call insurance company to determine out of pocket expense  if they have not yet received  this vaccine. Advised may also receive vaccine at local pharmacy or Health Dept. Verbalized acceptance and understanding.  Screening Tests Health Maintenance  Topic Date Due   Hepatitis C Screening  Never done   TETANUS/TDAP  Never done   Zoster Vaccines- Shingrix (1 of 2) Never done   COLONOSCOPY (Pts 45-75yrs Insurance coverage will need to be confirmed)  Never done   MAMMOGRAM  Never done   COVID-19 Vaccine (3 - Booster for [employer] series) 09/27/2020   INFLUENZA VACCINE  Completed   HIV Screening  Completed   HPV VACCINES  Aged Out    Health Maintenance  Health Maintenance Due  Topic Date Due   Hepatitis C Screening  Never done   TETANUS/TDAP  Never done   Zoster Vaccines- Shingrix (1 of 2) Never done   COLONOSCOPY (Pts 45-81yrs Insurance coverage will need to be confirmed)  Never done   MAMMOGRAM  Never done   COVID-19 Vaccine (3 - Booster for [employer] series) 09/27/2020    Colorectal cancer screening: decline  Mammogram status: ordered today  Bone Density status: n/a  Lung Cancer Screening: (Low Dose CT Chest recommended if Age 61-80 years, 30 pack-year currently smoking OR have quit w/in 15years.) does qualify.   Lung Cancer Screening Referral: CT scan 09/16/2021  Additional Screening:  Hepatitis C Screening: does qualify;   Vision Screening: Recommended annual ophthalmology exams for early detection of glaucoma and other disorders of the eye. Is the patient up to date with their annual eye exam?  No  Who is the provider or what is the name of the office in which the patient attends annual eye exams? none If pt is not established with a provider, would they like to be referred to a provider to establish care? No .   Dental Screening: Recommended annual dental exams for proper oral hygiene  Community Resource Referral / Chronic Care Management: CRR required this visit?  No   CCM required this visit?  No      Plan:     I have personally reviewed and noted  the following in the patients chart:   Medical and social history Use of alcohol, tobacco or illicit drugs  Current medications and supplements including opioid prescriptions.  Functional ability and status Nutritional status Physical activity Advanced directives List of other physicians Hospitalizations, surgeries, and ER visits in previous 12 months Vitals Screenings to include cognitive, depression, and falls Referrals and appointments  In addition, I have reviewed and discussed with patient certain preventive protocols, quality metrics, and best practice recommendations. A written personalized care plan for preventive services as well as general preventive health recommendations were provided to patient.     Kellie Simmering, LPN   [id number]   Nurse Notes: none

## 2021-09-14 ENCOUNTER — Other Ambulatory Visit: Payer: Self-pay

## 2021-09-14 DIAGNOSIS — I83892 Varicose veins of left lower extremities with other complications: Secondary | ICD-10-CM

## 2021-09-16 ENCOUNTER — Ambulatory Visit
Admission: RE | Admit: 2021-09-16 | Discharge: 2021-09-16 | Disposition: A | Discharge status: Home / Self Care | Payer: Medicare Other | Source: Ambulatory Visit | Attending: Nurse Practitioner | Admitting: Nurse Practitioner

## 2021-09-16 ENCOUNTER — Encounter: Payer: Self-pay | Admitting: Radiology

## 2021-09-16 ENCOUNTER — Other Ambulatory Visit: Payer: Self-pay | Admitting: Nurse Practitioner

## 2021-09-16 DIAGNOSIS — Z720 Tobacco use: Secondary | ICD-10-CM

## 2021-09-16 DIAGNOSIS — Z87891 Personal history of nicotine dependence: Secondary | ICD-10-CM

## 2021-09-19 NOTE — Progress Notes (Signed)
[facility] [place]  ASSESSMENT / PLAN: [name] is a 61 y.o. female with bilateral lower extremity pain. No ABI evidence of significant peripheral arterial disease. Venous reflux study shows short segment saphenous vein reflux - not a candidate for endovenous ablation. Recommend compression and elevation of the lower extremities. Lower extremity pain may be caused by neuropathy or spinal stenosis. Recommend follow up with primary care.   CHIEF COMPLAINT: leg pain  HISTORY OF PRESENT ILLNESS: [name] is a 61 y.o. female who returns to clinic for evaluation of bilateral lower extremity pain.  The patient has seen Dr. Kellie Simmering in the past in 2017 for severe chronic venous insufficiency causing venous stasis ulceration.  At that time she had no segment of greater saphenous vein that was refluxing to degree that would mollify her for endovenous ablation.  The patient reports that she has been having burning discomfort in her legs both with ambulation and at rest.  She does report she is able to walk a fair distance.  She does not endorse classic claudication symptoms.  Past Medical History:  Diagnosis Date   Adrenal adenoma 01/11/2021   Anemia    CHF (congestive heart failure) (HCC)    Chronic bronchitis ([place])    "probably once/yr" (7/42/5956)   Diastolic heart failure    Dyslipidemia    pt denies this hx on 05/12/2013   Exertional shortness of breath    GERD (gastroesophageal reflux disease)    Hypertension    Hypokalemia    Left ventricular hypertrophy 01/11/2021   Venous stasis ulcers (HCC)     Past Surgical History:  Procedure Laterality Date   IR US GUIDE VASC ACCESS RIGHT  02/27/2017   IR VENOGRAM ADRENAL BI  02/27/2017   IR VENOGRAM HEPATIC WO HEMODYNAMIC EVALUATION  02/27/2017   IR VENOGRAM RENAL BI  02/27/2017   IR VENOUS SAMPLING  02/27/2017   IR VENOUS SAMPLING  02/27/2017   VAGINAL HYSTERECTOMY  47's    Family History  Problem Relation  Age of Onset   Hypertension Mother    Kidney failure Mother    Heart attack Father    Hypertension Sister    Diabetes Sister    Hypertension Brother    Diabetes Sister     Social History   Socioeconomic History   Marital status: Single    Spouse name: Not on file   Number of children: Not on file   Years of education: Not on file   Highest education level: Not on file  Occupational History   Occupation: disability  Tobacco Use   Smoking status: Every Day    Packs/day: 0.50    Years: 10.00    Pack years: 5.00    Types: Cigarettes   Smokeless tobacco: Never   Tobacco comments:    trying to cut back, 3/29 - only had one cigarette today  Vaping Use   Vaping Use: Never used  Substance and Sexual Activity   Alcohol use: Yes    Alcohol/week: 7.0 standard drinks    Types: 7 Cans of beer per week    Comment: Beer once a week.   Drug use: No    Types: Cocaine    Comment: 05/12/2013 "tried it a few days ago; didn't like it"   Sexual activity: Not Currently  Other Topics Concern   Not on file  Social History Narrative   Not on file   Social Determinants of Health   Financial Resource Strain: Low Risk  Difficulty of Paying Living Expenses: Not hard at all  Food Insecurity: No Food Insecurity   Worried About [place] in the Last Year: Never true   Ran Out of Food in the Last Year: Never true  Transportation Needs: No Transportation Needs   Lack of Transportation (Medical): No   Lack of Transportation (Non-Medical): No  Physical Activity: Inactive   Days of Exercise per Week: 0 days   Minutes of Exercise per Session: 0 min  Stress: No Stress Concern Present   Feeling of Stress : Not at all  Social Connections: Not on file  Intimate Partner Violence: Not on file    Allergies  Allergen Reactions   Lisinopril Cough    Current Outpatient Medications  Medication Sig Dispense Refill   acetaminophen (TYLENOL) 325 MG tablet Take 650 mg by mouth every 6  (six) hours as needed for mild pain or headache.     amLODipine (NORVASC) 10 MG tablet Take 1 tablet (10 mg total) by mouth daily. 90 tablet 3   aspirin 81 MG chewable tablet Chew 81 mg by mouth daily.     atorvastatin (LIPITOR) 40 MG tablet Take 1 tablet (40 mg total) by mouth daily. 90 tablet 1   FARXIGA 10 MG TABS tablet Take 10 mg by mouth daily.     spironolactone (ALDACTONE) 25 MG tablet Take 25 mg by mouth daily.     losartan (COZAAR) 50 MG tablet Take 1 tablet (50 mg total) by mouth daily. (Patient not taking: Reported on 08/23/2021) 90 tablet 1   olmesartan (BENICAR) 40 MG tablet Take 40 mg by mouth daily. (Patient not taking: Reported on 09/08/2021)     No current facility-administered medications for this visit.    REVIEW OF SYSTEMS:  [X]  denotes positive finding, [ ]  denotes negative finding Cardiac  Comments:  Chest pain or chest pressure:    Shortness of breath upon exertion:    Short of breath when lying flat:    Irregular heart rhythm:        Vascular    Pain in calf, thigh, or hip brought on by ambulation: x   Pain in feet at night that wakes you up from your sleep:  x   Blood clot in your veins:    Leg swelling:         Pulmonary    Oxygen at home:    Productive cough:     Wheezing:         Neurologic    Sudden weakness in arms or legs:     Sudden numbness in arms or legs:     Sudden onset of difficulty speaking or slurred speech:    Temporary loss of vision in one eye:     Problems with dizziness:         Gastrointestinal    Blood in stool:     Vomited blood:         Genitourinary    Burning when urinating:     Blood in urine:        Psychiatric    Major depression:         Hematologic    Bleeding problems:    Problems with blood clotting too easily:        Skin    Rashes or ulcers:        Constitutional    Fever or chills:      PHYSICAL EXAM Vitals:   09/20/21 0907  BP: (!) 171/86  Pulse: 75  Resp: 20  Temp: 98.3 F (36.8 C)  SpO2:  100%  Weight: 186 lb (84.4 kg)  Height: 5\' 8"  (1.727 m)    Constitutional: Chronically ill appearing. no distress. Appears well nourished.  Neurologic: CN intact. no focal findings. no sensory loss. Psychiatric:  Mood and affect symmetric and appropriate. Eyes:  No icterus. No conjunctival pallor. Ears, nose, throat:  mucous membranes moist. Midline trachea.  Cardiac: regular rate and rhythm.  Respiratory:  unlabored. Abdominal:  non-distended.  Peripheral vascular: no palpable pedal or popliteal pulses. Strong doppler flow in bilateral feet.  Extremity: Mild bilateral pretibial edema. No cyanosis. No pallor.  Skin: Prior venous ulcer scarring over medial malleoli bilaterally. No gangrene. No active ulceration.  Lymphatic: No Stemmer's sign. no palpable lymphadenopathy.  PERTINENT LABORATORY AND RADIOLOGIC DATA  Most recent CBC CBC Latest Ref Rng & Units 05/17/2021 12/04/2020 09/05/2018  WBC - 4.5 4.9 6.9  Hemoglobin 12.0 - 16.0 11.6(A) 12.0 11.4(L)  Hematocrit 36 - 46 35(A) 37.6 39.6  Platelets 150 - 399 318 261 264     Most recent CMP CMP Latest Ref Rng & Units 08/23/2021 05/17/2021 12/09/2020  Glucose 70 - 99 mg/dL 83 - 63(L)  BUN 8 - 27 mg/dL 36(H) 40(A) 39(H)  Creatinine 0.57 - 1.00 mg/dL 2.10(H) 1.7(A) 1.73(H)  Sodium 134 - 144 mmol/L 145(H) 138 143  Potassium 3.5 - 5.2 mmol/L 4.8 3.1(A) 3.4(L)  Chloride 96 - 106 mmol/L 106 95(A) 99  CO2 20 - 29 mmol/L 23 32(A) 25  Calcium 8.7 - 10.3 mg/dL 9.0 9.2 9.6  Total Protein 6.0 - 8.5 g/dL 7.4 - 7.4  Total Bilirubin 0.0 - 1.2 mg/dL 0.3 - 0.6  Alkaline Phos 44 - 121 IU/L 77 - 70  AST 0 - 40 IU/L 17 - 12  ALT 0 - 32 IU/L 12 - 14    Renal function CrCl cannot be calculated (Patient's most recent lab result is older than the maximum 21 days allowed.).  Hgb A1c MFr Bld (%)  Date Value  08/23/2021 4.9    LDL Chol Calc (NIH)  Date Value Ref Range Status  08/23/2021 101 (H) 0 - 99 mg/dL Final    Left lower extremity venous  reflux study:  - No evidence of deep vein thrombosis seen in the left lower extremity,  from the common femoral through the popliteal veins.  - No evidence of superficial venous thrombosis in the left lower  extremity.  - Deep vein reflux in the CFV.  - Superficial vein reflux in the SFJ, and GSV distal thigh and at the    +-------+-----------+-----------+------------+------------+   ABI/TBI Today's ABI Today's TBI Previous ABI Previous TBI   +-------+-----------+-----------+------------+------------+   Right   1.01        0.81                                    +-------+-----------+-----------+------------+------------+   Left    1.09        0.76                                     +-------+-----------+-----------+------------+------------+    Yevonne Aline. Stanford Breed, MD [facility] Phone Number: [phone] 09/20/2021 10:50 AM  Total time spent on preparing this encounter including chart review, data review, collecting  history, examining the patient, coordinating care for this new patient, 60 minutes.  Portions of this report may have been transcribed using voice recognition software.  Every effort has been made to ensure accuracy; however, inadvertent computerized transcription errors may still be present.

## 2021-09-20 ENCOUNTER — Ambulatory Visit (INDEPENDENT_AMBULATORY_CARE_PROVIDER_SITE_OTHER)
Admission: RE | Admit: 2021-09-20 | Discharge: 2021-09-20 | Disposition: A | Discharge status: Home / Self Care | Payer: Medicare Other | Source: Ambulatory Visit | Attending: Vascular Surgery | Admitting: Vascular Surgery

## 2021-09-20 ENCOUNTER — Ambulatory Visit (HOSPITAL_COMMUNITY)
Admission: RE | Admit: 2021-09-20 | Discharge: 2021-09-20 | Disposition: A | Discharge status: Home / Self Care | Payer: Medicare Other | Source: Ambulatory Visit | Attending: Vascular Surgery | Admitting: Vascular Surgery

## 2021-09-20 ENCOUNTER — Other Ambulatory Visit: Payer: Self-pay

## 2021-09-20 ENCOUNTER — Ambulatory Visit (INDEPENDENT_AMBULATORY_CARE_PROVIDER_SITE_OTHER): Payer: Medicare Other | Admitting: Vascular Surgery

## 2021-09-20 ENCOUNTER — Encounter: Payer: Self-pay | Admitting: Vascular Surgery

## 2021-09-20 VITALS — BP 171/86 | HR 75 | Temp 98.3°F | Resp 20 | Ht 68.0 in | Wt 186.0 lb

## 2021-09-20 DIAGNOSIS — I739 Peripheral vascular disease, unspecified: Secondary | ICD-10-CM

## 2021-09-20 DIAGNOSIS — I872 Venous insufficiency (chronic) (peripheral): Secondary | ICD-10-CM

## 2021-09-20 DIAGNOSIS — I83892 Varicose veins of left lower extremities with other complications: Secondary | ICD-10-CM | POA: Insufficient documentation

## 2021-09-27 ENCOUNTER — Telehealth: Payer: Commercial Managed Care - HMO

## 2021-09-27 ENCOUNTER — Ambulatory Visit (INDEPENDENT_AMBULATORY_CARE_PROVIDER_SITE_OTHER): Payer: Medicare Other

## 2021-09-27 DIAGNOSIS — E782 Mixed hyperlipidemia: Secondary | ICD-10-CM

## 2021-09-27 DIAGNOSIS — I152 Hypertension secondary to endocrine disorders: Secondary | ICD-10-CM

## 2021-09-27 DIAGNOSIS — E269 Hyperaldosteronism, unspecified: Secondary | ICD-10-CM

## 2021-09-27 DIAGNOSIS — N1831 Chronic kidney disease, stage 3a: Secondary | ICD-10-CM

## 2021-09-27 DIAGNOSIS — I872 Venous insufficiency (chronic) (peripheral): Secondary | ICD-10-CM

## 2021-09-27 DIAGNOSIS — I5189 Other ill-defined heart diseases: Secondary | ICD-10-CM

## 2021-09-28 NOTE — Chronic Care Management (AMB) (Signed)
Chronic Care Management   CCM RN Visit Note  09/27/2021 Name: [name] MRN: [id number] DOB: 1961/05/07  Subjective: [name] is a 61 y.o. year old female who is a primary care patient of [name], [place]. The care management team was consulted for assistance with disease management and care coordination needs.    Engaged with patient by telephone for follow up visit in response to provider referral for case management and/or care coordination services.   Consent to Services:  The patient was given information about Chronic Care Management services, agreed to services, and gave verbal consent prior to initiation of services.  Please see initial visit note for detailed documentation.   Patient agreed to services and verbal consent obtained.   Assessment: Review of patient past medical history, allergies, medications, health status, including review of consultants reports, laboratory and other test data, was performed as part of comprehensive evaluation and provision of chronic care management services.   SDOH (Social Determinants of Health) assessments and interventions performed:  Yes, no acute needs   CCM Care Plan  Allergies  Allergen Reactions   Lisinopril Cough    Outpatient Encounter Medications as of 09/27/2021  Medication Sig   acetaminophen (TYLENOL) 325 MG tablet Take 650 mg by mouth every 6 (six) hours as needed for mild pain or headache.   amLODipine (NORVASC) 10 MG tablet Take 1 tablet (10 mg total) by mouth daily.   aspirin 81 MG chewable tablet Chew 81 mg by mouth daily.   atorvastatin (LIPITOR) 40 MG tablet Take 1 tablet (40 mg total) by mouth daily.   FARXIGA 10 MG TABS tablet Take 10 mg by mouth daily.   olmesartan (BENICAR) 40 MG tablet Take 40 mg by mouth daily. (Patient not taking: Reported on 09/08/2021)   spironolactone (ALDACTONE) 25 MG tablet Take 25 mg by mouth daily.   No facility-administered encounter medications on file as of 09/27/2021.     Patient Active Problem List   Diagnosis Date Noted   Adrenal adenoma 01/11/2021   Left ventricular hypertrophy 01/11/2021   Substance-induced disorder ([place]) 12/05/2020   Cocaine use disorder ([place]) 12/05/2020   Atypical angina ([place]) 10/11/2020   Stress 02/27/2019   Hypoglycemia without diagnosis of diabetes mellitus 09/06/2018   CKD (chronic kidney disease) stage 3, GFR 30-59 ml/min (HCC) 09/06/2018   Abdominal pain 09/06/2018   Chest pain 09/05/2018   Hypertensive urgency 09/05/2018   Wrist joint effusion, right 05/30/2017   Hypertension due to endocrine disorder 01/17/2017   Hyperaldosteronism ([place]) 01/17/2017   AKI (acute kidney injury) ([place]) 10/19/2015   Varicose veins of left lower extremity with complications 94/17/4081   Anemia 12/25/2014   Encounter for preventive care 11/11/2014   Tobacco abuse 44/81/8563   Diastolic dysfunction without heart failure 06/11/2013   GERD (gastroesophageal reflux disease) 06/11/2013   Severe uncontrolled hypertension 05/12/2013   Hyperlipemia 05/12/2013    Conditions to be addressed/monitored: HTN, CKD, Venous Stasis Ulcer, HLD, Diastolic Dysfunction, Hyperaldosteronism   Care Plan : RN Care Manager Plan of Care  Updates made by Lynne Logan, RN since 09/27/2021 12:00 AM     Problem: No plan of care established for management of chronic disease states (HTN, CKD, Venous Stasis Ulcer, HLD, Diastolic Dysfunction)   Priority: High     Long-Range Goal: Development of plan of care for chronic disease states (HTN, CKD, Venous Stasis Ulcer, HLD, Diastolic Dysfunction)   Start Date: 07/29/2021  Expected End Date: 07/29/2022  Recent Progress: On track  Priority: High  Note:   Current Barriers:  Knowledge Deficits related to plan of care for management of HTN, CKD, Venous Stasis Ulcer, HLD, Diastolic Dysfunction, Hyperaldosteronism    Chronic Disease Management support and education needs related to HTN, CKD, Venous Stasis Ulcer, HLD,  Diastolic Dysfunction,Hyperaldosteronism     RNCM Clinical Goal(s):  Patient will verbalize basic understanding of  HTN, CKD, Venous Stasis Ulcer, HLD, Diastolic Dysfunction, Hyperaldosteronism   disease process and self health management plan as evidenced by patient will report having no disease exacerbations related to her chronic disease states listed above take all medications exactly as prescribed and will call provider for medication related questions as evidenced by patient will report missing no doses of her prescribed medications  demonstrate Improved health management independence as evidenced by patient will report 100% adherence to her prescribed treatment plan  continue to work with RN Care Manager to address care management and care coordination needs related to  HTN, CKD, Venous Stasis Ulcer, HLD, Diastolic Dysfunction, Hyperaldosteronism   as evidenced by adherence to CM Team Scheduled appointments demonstrate ongoing self health care management ability   as evidenced by    through collaboration with RN Care manager, provider, and care team.   Interventions: 1:1 collaboration with primary care provider regarding development and update of comprehensive plan of care as evidenced by provider attestation and co-signature Inter-disciplinary care team collaboration (see longitudinal plan of care) Evaluation of current treatment plan related to  self management and patient's adherence to plan as established by provider   Chronic Kidney Disease Interventions:  (Status:  Goal on track:  Yes.) Long Term Goal Provided education to patient about basic CKD disease process Review of patient status, including review of consultant's reports, relevant laboratory and other test results, and medications completed Educated patient on the importance to keep BP well controlled and increase water intake to 3-4 bottles per day (64 oz daily) Determined patient will undergo a Robotic XI Laparoscopy,  surgical, w/adrenalectomy partial/complete, or exploration adrenal gland w/wo biopsy Discussed patient has an upcoming follow up appointment with Dr. Candiss Norse with [facility] on 10/11/21 Mailed printed educational materials related to Eating Right with Chronic Kidney disease  Discussed plans with patient for ongoing care management follow up and provided patient with direct contact information for care management team Last practice recorded BP readings:  BP Readings from Last 3 Encounters:  09/20/21 (!) 171/86  08/23/21 (!) 150/80  01/11/21 (!) 172/90  Most recent eGFR/CrCl:  Lab Results  Component Value Date   EGFR 26 (L) 08/23/2021    No components found for: CRCL  Diabetes Interventions:  (Status:  Condition stable.  Not addressed this visit.) Long Term Goal Assessed patient's understanding of A1c goal:  <5.7 Provided education to patient about basic DM disease process Review of patient status, including review of consultant's reports, relevant laboratory and other test results, and medications completed. Reviewed medications with patient and discussed importance of medication adherence Educated on dietary and exercise recommendations for ongoing DM management  Lab Results  Component Value Date   HGBA1C 5.1 10/17/2017  Surgery: Adrenalectomy:  (Status: Goal on track:  Yes.) Short Term Goal Evaluation of current treatment plan related to Adrenalectomy robotic procedure scheduled for 10/03/21 assessed patient/caregiver understanding of surgical procedure   reviewed pre-operative instructions with patient/caregiver  reviewed post-operative instructions with patient/caregiver reviewed medications with patient and addressed questions Determined patient verbalizes understanding of her treatment plan, pre and post operatively  Discussed plans with patient for ongoing care management follow  up and provided patient with direct contact information for care management team  Hyperlipidemia  Interventions:  (Status:  New goal.) Long Term Goal Medication review performed; medication list updated in electronic medical record.  Provider established cholesterol goals reviewed Counseled on importance of regular laboratory monitoring as prescribed Provided HLD educational materials Reviewed importance of limiting foods high in cholesterol Reviewed exercise goals and target of 150 minutes per week Printed educational materials related to [employer]; Cooking to Lower Cholesterol  Discussed plans with patient for ongoing care management follow up and provided patient with direct contact information for care management team Lipid Panel     Component Value Date/Time   CHOL 189 08/23/2021 1444   TRIG 193 (H) 08/23/2021 1444   HDL 55 08/23/2021 1444   CHOLHDL 3.4 08/23/2021 1444   CHOLHDL 4.9 05/13/2013 0437   VLDL 20 05/13/2013 0437   LDLCALC 101 (H) 08/23/2021 1444   LABVLDL 33 08/23/2021 1444     COVID 19 infection: (Status:  Goal Met.)  Short Term Goal Evaluation of current treatment plan related to  COVID 19 infection , self-management and patient's adherence to plan as established by provider Determined patient has made a full recovery from the [place] 19 infection, she has returned to her part time job Discussed plans with patient for ongoing care management follow up and provided patient with direct contact information for care management team   Patient Goals/Self-Care Activities: Take all medications as prescribed Attend all scheduled provider appointments Call pharmacy for medication refills 3-7 days in advance of running out of medications Perform all self care activities independently  Perform IADL's (shopping, preparing meals, housekeeping, managing finances) independently call doctor for signs and symptoms of high blood pressure keep all doctor appointments take medications for blood pressure exactly as prescribed report new symptoms to your doctor  Follow Up  Plan:  Telephone follow up appointment with care management team member scheduled for:  10/11/21      Plan:Telephone follow up appointment with care management team member scheduled for:  10/11/21  Barb Merino, RN, BSN, CCM Care Management Coordinator [place] [facility]  Direct Phone: [phone]

## 2021-09-28 NOTE — Patient Instructions (Signed)
Visit Information  Thank you for taking time to visit with me today. Please don't hesitate to contact me if I can be of assistance to you before our next scheduled telephone appointment.  Following are the goals we discussed today:  (Copy and paste patient goals from clinical care plan here)  Our next appointment is by telephone on 10/11/21 at 1:05 PM  Please call the care guide team at [phone] if you need to cancel or reschedule your appointment.   If you are experiencing a Mental Health or [place] or need someone to talk to, please call 1-800-273-TALK (toll free, 24 hour hotline)   The patient verbalized understanding of instructions, educational materials, and care plan provided today and agreed to receive a mailed copy of patient instructions, educational materials, and care plan.   Barb Merino, RN, BSN, CCM Care Management Coordinator [place] [facility]  Direct Phone: [phone]

## 2021-10-10 ENCOUNTER — Telehealth: Payer: Self-pay

## 2021-10-10 NOTE — Telephone Encounter (Signed)
Rcvd a letter from [employer] about medication adherence for farxiga. Pt does not have DM. Pt currently taking farxiga for kidney protection and CHF.

## 2021-10-11 ENCOUNTER — Telehealth: Payer: Medicare Other

## 2021-10-11 DIAGNOSIS — E269 Hyperaldosteronism, unspecified: Secondary | ICD-10-CM | POA: Diagnosis not present

## 2021-10-11 DIAGNOSIS — I129 Hypertensive chronic kidney disease with stage 1 through stage 4 chronic kidney disease, or unspecified chronic kidney disease: Secondary | ICD-10-CM | POA: Diagnosis not present

## 2021-10-11 DIAGNOSIS — R809 Proteinuria, unspecified: Secondary | ICD-10-CM | POA: Diagnosis not present

## 2021-10-11 DIAGNOSIS — R609 Edema, unspecified: Secondary | ICD-10-CM | POA: Diagnosis not present

## 2021-10-11 DIAGNOSIS — N1832 Chronic kidney disease, stage 3b: Secondary | ICD-10-CM | POA: Diagnosis not present

## 2021-10-11 DIAGNOSIS — D631 Anemia in chronic kidney disease: Secondary | ICD-10-CM | POA: Diagnosis not present

## 2021-10-11 DIAGNOSIS — N2581 Secondary hyperparathyroidism of renal origin: Secondary | ICD-10-CM | POA: Diagnosis not present

## 2021-10-13 ENCOUNTER — Telehealth: Payer: Self-pay | Admitting: *Deleted

## 2021-10-13 NOTE — Chronic Care Management (AMB) (Signed)
°  Care Management   Note  10/13/2021 Name: [name] MRN: [id number] DOB: 06-17-1961  [name] is a 61 y.o. year old female who is a primary care patient of [name], [place] and is actively engaged with the care management team. I reached out to [facility] by phone today to assist with re-scheduling a follow up visit with the RN Case Manager  Follow up plan: Unsuccessful telephone outreach attempt made. A HIPAA compliant phone message was left for the patient providing contact information and requesting a return call.  The care management team will reach out to the patient again over the next 7 days.  If patient returns call to provider office, please advise to call [place] at [place] [facility]  Direct Dial: [phone]

## 2021-10-17 ENCOUNTER — Telehealth: Payer: Medicare Other

## 2021-10-18 DIAGNOSIS — E782 Mixed hyperlipidemia: Secondary | ICD-10-CM | POA: Diagnosis not present

## 2021-10-18 DIAGNOSIS — N1831 Chronic kidney disease, stage 3a: Secondary | ICD-10-CM

## 2021-10-18 DIAGNOSIS — I152 Hypertension secondary to endocrine disorders: Secondary | ICD-10-CM

## 2021-10-21 NOTE — Chronic Care Management (AMB) (Signed)
?  Care Management  ? ?Note ? ?10/21/2021 ?Name: [name] MRN: [id number] DOB: 1961-02-09 ? ?[name] is a 61 y.o. year old female who is a primary care patient of [name], [place] and is actively engaged with the care management team. I reached out to [facility] by phone today to assist with re-scheduling a follow up visit with the RN Case Manager ? ?Follow up plan: ?Telephone appointment with care management team member scheduled for:11/08/21 ? ?Laverda Sorenson  ?Care Guide, Embedded Care Coordination ?[place]  [facility]  ?Direct Dial: [phone] ? ?

## 2021-10-25 ENCOUNTER — Other Ambulatory Visit: Payer: Self-pay

## 2021-10-25 DIAGNOSIS — I10 Essential (primary) hypertension: Secondary | ICD-10-CM

## 2021-10-25 MED ORDER — FARXIGA 10 MG PO TABS: 10.0000 mg | ORAL_TABLET | Freq: Every day | ORAL | 0 refills | Status: DC

## 2021-10-25 MED ORDER — AMLODIPINE BESYLATE 10 MG PO TABS: 10.0000 mg | ORAL_TABLET | Freq: Every day | ORAL | 0 refills | Status: DC

## 2021-10-26 ENCOUNTER — Other Ambulatory Visit: Payer: Self-pay

## 2021-10-26 DIAGNOSIS — I10 Essential (primary) hypertension: Secondary | ICD-10-CM

## 2021-10-26 DIAGNOSIS — E782 Mixed hyperlipidemia: Secondary | ICD-10-CM

## 2021-10-26 MED ORDER — ATORVASTATIN CALCIUM 40 MG PO TABS: 40.0000 mg | ORAL_TABLET | Freq: Every day | ORAL | 1 refills | Status: DC

## 2021-10-26 MED ORDER — AMLODIPINE BESYLATE 10 MG PO TABS: 10.0000 mg | ORAL_TABLET | Freq: Every day | ORAL | 0 refills | Status: DC

## 2021-10-26 MED ORDER — FARXIGA 10 MG PO TABS: 10.0000 mg | ORAL_TABLET | Freq: Every day | ORAL | 0 refills | Status: DC

## 2021-11-08 ENCOUNTER — Telehealth: Payer: Medicare Other

## 2021-11-08 ENCOUNTER — Ambulatory Visit (INDEPENDENT_AMBULATORY_CARE_PROVIDER_SITE_OTHER): Payer: Medicare Other

## 2021-11-08 DIAGNOSIS — I5189 Other ill-defined heart diseases: Secondary | ICD-10-CM

## 2021-11-08 DIAGNOSIS — E269 Hyperaldosteronism, unspecified: Secondary | ICD-10-CM

## 2021-11-08 DIAGNOSIS — E782 Mixed hyperlipidemia: Secondary | ICD-10-CM

## 2021-11-08 DIAGNOSIS — N1831 Chronic kidney disease, stage 3a: Secondary | ICD-10-CM

## 2021-11-08 DIAGNOSIS — I152 Hypertension secondary to endocrine disorders: Secondary | ICD-10-CM

## 2021-11-08 DIAGNOSIS — I872 Venous insufficiency (chronic) (peripheral): Secondary | ICD-10-CM

## 2021-11-08 NOTE — Patient Instructions (Signed)
Visit Information ? ?Thank you for taking time to visit with me today. Please don't hesitate to contact me if I can be of assistance to you before our next scheduled telephone appointment. ? ?Following are the goals we discussed today:  ?(Copy and paste patient goals from clinical care plan here) ? ?Our next appointment is by telephone on 11/25/21 at 09:30 AM ? ?Please call the care guide team at [phone] if you need to cancel or reschedule your appointment.  ? ?If you are experiencing a Mental Health or [place] or need someone to talk to, please call 1-800-273-TALK (toll free, 24 hour hotline)  ? ?The patient verbalized understanding of instructions, educational materials, and care plan provided today and agreed to receive a mailed copy of patient instructions, educational materials, and care plan.  ? ?Barb Merino, RN, BSN, CCM ?Care Management Coordinator ?[facility] [facility]  ?Direct Phone: [phone] ? ? ?

## 2021-11-08 NOTE — Chronic Care Management (AMB) (Signed)
?Chronic Care Management  ? ?CCM RN Visit Note ? ?11/08/2021 ?Name: [name] MRN: [id number] DOB: 12/05/1960 ? ?Subjective: ?[name] is a 61 y.o. year old female who is a primary care patient of [name], [place]. The care management team was consulted for assistance with disease management and care coordination needs.   ? ?Engaged with patient by telephone for follow up visit in response to provider referral for case management and/or care coordination services.  ? ?Consent to Services:  ?The patient was given information about Chronic Care Management services, agreed to services, and gave verbal consent prior to initiation of services.  Please see initial visit note for detailed documentation.  ? ?Patient agreed to services and verbal consent obtained.  ? ?Assessment: Review of patient past medical history, allergies, medications, health status, including review of consultants reports, laboratory and other test data, was performed as part of comprehensive evaluation and provision of chronic care management services.  ? ?SDOH (Social Determinants of Health) assessments and interventions performed:  Yes, no acute needs ? ?CCM Care Plan ? ?Allergies  ?Allergen Reactions  ? Lisinopril Cough  ? ? ?Outpatient Encounter Medications as of 11/08/2021  ?Medication Sig  ? acetaminophen (TYLENOL) 325 MG tablet Take 650 mg by mouth every 6 (six) hours as needed for mild pain or headache.  ? amLODipine (NORVASC) 10 MG tablet Take 1 tablet (10 mg total) by mouth daily.  ? aspirin 81 MG chewable tablet Chew 81 mg by mouth daily.  ? atorvastatin (LIPITOR) 40 MG tablet Take 1 tablet (40 mg total) by mouth daily.  ? FARXIGA 10 MG TABS tablet Take 1 tablet (10 mg total) by mouth daily.  ? losartan (COZAAR) 50 MG tablet Take 1 tablet (50 mg total) by mouth daily. (Patient not taking: Reported on 08/23/2021)  ? olmesartan (BENICAR) 40 MG tablet Take 40 mg by mouth daily. (Patient not taking: Reported on 09/08/2021)  ?  spironolactone (ALDACTONE) 25 MG tablet Take 25 mg by mouth daily.  ? ?No facility-administered encounter medications on file as of 11/08/2021.  ? ? ?Patient Active Problem List  ? Diagnosis Date Noted  ? Adrenal adenoma 01/11/2021  ? Left ventricular hypertrophy 01/11/2021  ? Substance-induced disorder ([place]) 12/05/2020  ? Cocaine use disorder ([place]) 12/05/2020  ? Atypical angina ([place]) 10/11/2020  ? Stress 02/27/2019  ? Hypoglycemia without diagnosis of diabetes mellitus 09/06/2018  ? CKD (chronic kidney disease) stage 3, GFR 30-59 ml/min (HCC) 09/06/2018  ? Abdominal pain 09/06/2018  ? Chest pain 09/05/2018  ? Hypertensive urgency 09/05/2018  ? Wrist joint effusion, right 05/30/2017  ? Hypertension due to endocrine disorder 01/17/2017  ? Hyperaldosteronism ([place]) 01/17/2017  ? AKI (acute kidney injury) ([place]) 10/19/2015  ? Varicose veins of left lower extremity with complications 74/07/8785  ? Anemia 12/25/2014  ? Encounter for preventive care 11/11/2014  ? Tobacco abuse 11/17/2013  ? Diastolic dysfunction without heart failure 06/11/2013  ? GERD (gastroesophageal reflux disease) 06/11/2013  ? Severe uncontrolled hypertension 05/12/2013  ? Hyperlipemia 05/12/2013  ? ? ?Conditions to be addressed/monitored: HTN, CKD, Venous Stasis Ulcer, HLD, Diastolic Dysfunction, Hyperaldosteronism  ? ?Care Plan : RN Care Manager Plan of Care  ?Updates made by Lynne Logan, RN since 11/08/2021 12:00 AM  ?  ? ?Problem: No plan of care established for management of chronic disease states (HTN, CKD, Venous Stasis Ulcer, HLD, Diastolic Dysfunction)   ?Priority: High  ?  ? ?Long-Range Goal: Development of plan of care for chronic disease states (HTN, CKD,  Venous Stasis Ulcer, HLD, Diastolic Dysfunction)   ?Start Date: 07/29/2021  ?Expected End Date: 07/29/2022  ?Recent Progress: On track  ?Priority: High  ?Note:   ?Current Barriers:  ?Knowledge Deficits related to plan of care for management of HTN, CKD, Venous Stasis Ulcer, HLD, Diastolic  Dysfunction, Hyperaldosteronism    ?Chronic Disease Management support and education needs related to HTN, CKD, Venous Stasis Ulcer, HLD, Diastolic Dysfunction,Hyperaldosteronism    ? ?RNCM Clinical Goal(s):  ?Patient will verbalize basic understanding of  HTN, CKD, Venous Stasis Ulcer, HLD, Diastolic Dysfunction, Hyperaldosteronism   disease process and self health management plan as evidenced by patient will report having no disease exacerbations related to her chronic disease states listed above ?take all medications exactly as prescribed and will call provider for medication related questions as evidenced by patient will report missing no doses of her prescribed medications  ?demonstrate Improved health management independence as evidenced by patient will report 100% adherence to her prescribed treatment plan  ?continue to work with RN Care Manager to address care management and care coordination needs related to  HTN, CKD, Venous Stasis Ulcer, HLD, Diastolic Dysfunction, Hyperaldosteronism   as evidenced by adherence to CM Team Scheduled appointments ?demonstrate ongoing self health care management ability   as evidenced by    through collaboration with RN Care manager, provider, and care team.  ? ?Interventions: ?1:1 collaboration with primary care provider regarding development and update of comprehensive plan of care as evidenced by provider attestation and co-signature ?Inter-disciplinary care team collaboration (see longitudinal plan of care) ?Evaluation of current treatment plan related to  self management and patient's adherence to plan as established by provider ? ? Chronic Kidney Disease Interventions:  (Status:  Condition stable.  Not addressed this visit.) Long Term Goal ?Provided education to patient about basic CKD disease process ?Review of patient status, including review of consultant's reports, relevant laboratory and other test results, and medications completed ?Educated patient on the  importance to keep BP well controlled and increase water intake to 3-4 bottles per day (64 oz daily) ?Determined patient will undergo a Robotic XI Laparoscopy, surgical, w/adrenalectomy partial/complete, or exploration adrenal gland w/wo biopsy ?Discussed patient has an upcoming follow up appointment with Dr. Candiss Norse with [facility] on 10/11/21 ?Mailed printed educational materials related to Eating Right with Chronic Kidney disease  ?Discussed plans with patient for ongoing care management follow up and provided patient with direct contact information for care management team ?Last practice recorded BP readings:  ?BP Readings from Last 3 Encounters:  ?09/20/21 (!) 171/86  ?08/23/21 (!) 150/80  ?01/11/21 (!) 172/90  ?Most recent eGFR/CrCl:  ?Lab Results  ?Component Value Date  ? EGFR 26 (L) 08/23/2021  ?  No components found for: CRCL ? ?Diabetes Interventions:  (Status:  Condition stable.  Not addressed this visit.) Long Term Goal ?Assessed patient's understanding of A1c goal:  <5.7 ?Provided education to patient about basic DM disease process ?Review of patient status, including review of consultant's reports, relevant laboratory and other test results, and medications completed. ?Reviewed medications with patient and discussed importance of medication adherence ?Educated on dietary and exercise recommendations for ongoing DM management  ?Lab Results  ?Component Value Date  ? HGBA1C 5.1 10/17/2017  ?Surgery: Adrenalectomy:  (Status: Goal on track:  Yes.) Short Term Goal ?Evaluation of current treatment plan related to Adrenalectomy robotic procedure scheduled for 11/14/21 ?assessed patient/caregiver understanding of surgical procedure   ?reviewed pre-operative instructions with patient/caregiver  ?reviewed post-operative instructions with patient/caregiver ?reviewed medications  with patient and addressed questions ?Determined patient verbalizes understanding of her treatment plan, pre and post operatively   ?Discussed plans with patient for ongoing care management follow up and provided patient with direct contact information for care management team ? ?Hyperlipidemia Interventions:  (Status:  Condition stable.  Not add

## 2021-11-18 DIAGNOSIS — N1831 Chronic kidney disease, stage 3a: Secondary | ICD-10-CM

## 2021-11-18 DIAGNOSIS — E782 Mixed hyperlipidemia: Secondary | ICD-10-CM | POA: Diagnosis not present

## 2021-11-18 DIAGNOSIS — I152 Hypertension secondary to endocrine disorders: Secondary | ICD-10-CM | POA: Diagnosis not present

## 2021-11-25 ENCOUNTER — Telehealth: Payer: Medicare Other

## 2021-12-02 ENCOUNTER — Telehealth: Payer: Medicare Other

## 2021-12-22 ENCOUNTER — Telehealth: Payer: Medicare Other

## 2021-12-22 ENCOUNTER — Telehealth: Payer: Self-pay

## 2021-12-22 NOTE — Telephone Encounter (Signed)
?  Care Management  ? ?Follow Up Note ? ? ?12/22/2021 ?Name: [name] MRN: [id number] DOB: May 20, 1961 ? ? ?Referred by: [name], FNP ?Reason for referral : Chronic Care Management (RN CM Follow up call ) ? ? ?An unsuccessful telephone outreach was attempted today. The patient was referred to the case management team for assistance with care management and care coordination. Sent in basket message to embedded BSW Daneen Schick requesting a call to patient to offer transportation resources.  ? ?Follow Up Plan: A HIPPA compliant phone message was left for the patient providing contact information and requesting a return call.  ? ?Barb Merino, RN, BSN, CCM ?Care Management Coordinator ?[place] [facility]  ?Direct Phone: [phone] ? ? ?

## 2021-12-26 ENCOUNTER — Ambulatory Visit (INDEPENDENT_AMBULATORY_CARE_PROVIDER_SITE_OTHER): Payer: Medicare Other

## 2021-12-26 DIAGNOSIS — I10 Essential (primary) hypertension: Secondary | ICD-10-CM

## 2021-12-26 DIAGNOSIS — N1831 Chronic kidney disease, stage 3a: Secondary | ICD-10-CM

## 2021-12-26 NOTE — Patient Instructions (Signed)
Social Worker Visit Information ? ?Goals we discussed today:  ?Patient Goals/Self-Care Activities ?patient will:  ? - Review mailed resource information to learn about health plan transportation  ?-Contact SW as needed prior to next scheduled call ? ?Materials Provided: Yes: verbal and written education provided ? ?The patient verbalized understanding of instructions, educational materials, and care plan provided today and declined offer to receive copy of patient instructions, educational materials, and care plan.  ? ?Follow Up Plan: SW will follow up with patient by phone over the next month ? ? ?Daneen Schick, BSW, CDP ?Social Worker, Certified Dementia Practitioner ?[facility] / [facility] ?[phone] ? ?   ? ?

## 2021-12-26 NOTE — Chronic Care Management (AMB) (Signed)
?Chronic Care Management  ? ? Social Work Note ? ?12/26/2021 ?Name: [name] MRN: [id number] DOB: [birth year] ? ?[name] is a 61 y.o. year old female who is a primary care patient of [name], [place]. The CCM team was consulted to assist the patient with chronic disease management and/or care coordination needs related to:  HTN, CHF, CKD III .  ? ?Engaged with patient by telephone for follow up visit in response to provider referral for social work chronic care management and care coordination services.  ? ?Consent to Services:  ?The patient was given information about Chronic Care Management services, agreed to services, and gave verbal consent prior to initiation of services.  Please see initial visit note for detailed documentation.  ? ?Patient agreed to services and consent obtained.  ? ?Assessment: Review of patient past medical history, allergies, medications, and health status, including review of relevant consultants reports was performed today as part of a comprehensive evaluation and provision of chronic care management and care coordination services.    ? ?SDOH (Social Determinants of Health) assessments and interventions performed:  ?SDOH Interventions   ? ?Flowsheet Row Most Recent Value  ?SDOH Interventions   ?Transportation Interventions Payor Benefit  ? ?  ?  ? ?Advanced Directives Status: Not addressed in this encounter. ? ?CCM Care Plan ? ?Allergies  ?Allergen Reactions  ? Lisinopril Cough  ? ? ?Outpatient Encounter Medications as of 12/26/2021  ?Medication Sig  ? acetaminophen (TYLENOL) 325 MG tablet Take 650 mg by mouth every 6 (six) hours as needed for mild pain or headache.  ? amLODipine (NORVASC) 10 MG tablet Take 1 tablet (10 mg total) by mouth daily.  ? aspirin 81 MG chewable tablet Chew 81 mg by mouth daily.  ? atorvastatin (LIPITOR) 40 MG tablet Take 1 tablet (40 mg total) by mouth daily.  ? FARXIGA 10 MG TABS tablet Take 1 tablet (10 mg total) by mouth daily.  ? losartan  (COZAAR) 50 MG tablet Take 1 tablet (50 mg total) by mouth daily. (Patient not taking: Reported on 08/23/2021)  ? olmesartan (BENICAR) 40 MG tablet Take 40 mg by mouth daily. (Patient not taking: Reported on 09/08/2021)  ? spironolactone (ALDACTONE) 25 MG tablet Take 25 mg by mouth daily.  ? ?No facility-administered encounter medications on file as of 12/26/2021.  ? ? ?Patient Active Problem List  ? Diagnosis Date Noted  ? Adrenal adenoma 01/11/2021  ? Left ventricular hypertrophy 01/11/2021  ? Substance-induced disorder ([place]) 12/05/2020  ? Cocaine use disorder ([place]) 12/05/2020  ? Atypical angina ([place]) 10/11/2020  ? Stress 02/27/2019  ? Hypoglycemia without diagnosis of diabetes mellitus 09/06/2018  ? CKD (chronic kidney disease) stage 3, GFR 30-59 ml/min (HCC) 09/06/2018  ? Abdominal pain 09/06/2018  ? Chest pain 09/05/2018  ? Hypertensive urgency 09/05/2018  ? Wrist joint effusion, right 05/30/2017  ? Hypertension due to endocrine disorder 01/17/2017  ? Hyperaldosteronism ([place]) 01/17/2017  ? AKI (acute kidney injury) ([place]) 10/19/2015  ? Varicose veins of left lower extremity with complications 83/66/2947  ? Anemia 12/25/2014  ? Encounter for preventive care 11/11/2014  ? Tobacco abuse 11/17/2013  ? Diastolic dysfunction without heart failure 06/11/2013  ? GERD (gastroesophageal reflux disease) 06/11/2013  ? Severe uncontrolled hypertension 05/12/2013  ? Hyperlipemia 05/12/2013  ? ? ?Conditions to be addressed/monitored: CHF, HTN, and CKD Stage III ; Transportation ? ?Care Plan : Social Work Plan of Care  ?Updates made by Daneen Schick since 12/26/2021 12:00 AM  ?  ? ?Problem: Barriers  to Treatment   ?  ? ?Goal: Barriers to Treatment Identified and Managed   ?Start Date: 12/26/2021  ?Priority: High  ?Note:   ?Current Barriers:  ?Chronic disease management support and education needs related to CHF, HTN, and CKD Stage III   ?Transportation ? ?Social Worker Clinical Goal(s):  ?patient will work with SW to identify and  address any acute and/or chronic care coordination needs related to the self health management of CHF, HTN, and CKD Stage III   ?explore community resource options for unmet needs related to:  Transportation ?SW Interventions:  ?Inter-disciplinary care team collaboration (see longitudinal plan of care) ?Collaboration with Summer Brine, FNP regarding development and update of comprehensive plan of care as evidenced by provider attestation and co-signature ?Collaboration with RN Care Manager who requests SW assistance with transportation needs due to patient recently canceling an appointment due to lack of transportation ?Telephonic visit completed with the patient  ?Discussed the patient normally has her sister transport her to appointments or a friend if her sister in unavailable ?Determined the patient was unaware that her health plan had a transportation benefit ?Provided verbal and written education to the patient advising how to access benefit ?Scheduled follow up call over the next month to ensure patient received educational materials and has no other needs ? ?Patient Goals/Self-Care Activities ?patient will:  ? -  Review mailed resource information to learn about health plan transportation  ?-Contact SW as needed prior to next scheduled call ? ?Follow Up Plan: The care management team will reach out to the patient again over the next 30 days.  ? ?  ?  ? ?Follow Up Plan: SW will follow up with patient by phone over the next month ?     ?Daneen Schick, BSW, CDP ?Social Worker, Certified Dementia Practitioner ?[facility] ?[phone] ? ?   ? ? ? ? ?

## 2022-01-10 ENCOUNTER — Telehealth: Payer: Medicare Other

## 2022-01-10 ENCOUNTER — Telehealth: Payer: Self-pay

## 2022-01-10 NOTE — Telephone Encounter (Signed)
  Care Management   Follow Up Note   01/10/2022 Name: [name] MRN: [id number] DOB: 12/10/1960   Referred by: [name], FNP Reason for referral : Chronic Care Management (RN CM Follow up call )   A second unsuccessful telephone outreach was attempted today. The patient was referred to the case management team for assistance with care management and care coordination.   Follow Up Plan: A HIPPA compliant phone message was left for the patient providing contact information and requesting a return call.   Barb Merino, RN, BSN, CCM Care Management Coordinator [facility]  Direct Phone: [phone]

## 2022-01-17 ENCOUNTER — Ambulatory Visit: Payer: Medicare Other

## 2022-01-17 DIAGNOSIS — N1831 Chronic kidney disease, stage 3a: Secondary | ICD-10-CM

## 2022-01-17 DIAGNOSIS — I10 Essential (primary) hypertension: Secondary | ICD-10-CM

## 2022-01-17 DIAGNOSIS — I509 Heart failure, unspecified: Secondary | ICD-10-CM

## 2022-01-17 NOTE — Patient Instructions (Signed)
Social Worker Visit Information  Goals we discussed today:  Patient Goals/Self-Care Activities patient will:   - Utilize health plan transportation benefit as needed for medical appointments -Contact SW as needed   Materials Provided: Verbal education about health plan benefit provided by phone  The patient verbalized understanding of instructions, educational materials, and care plan provided today and DECLINED offer to receive copy of patient instructions, educational materials, and care plan.   Follow Up Plan:  No SW follow up planned at this time. Please contact me as needed.   Daneen Schick, BSW, CDP Social Worker, Certified Dementia Practitioner [facility] [phone]

## 2022-01-17 NOTE — Chronic Care Management (AMB) (Signed)
Chronic Care Management    Social Work Note  01/17/2022 Name: [name] MRN: [id number] DOB: 03-04-1961  [name] is a 61 y.o. year old female who is a primary care patient of [name], [place]. The CCM team was consulted to assist the patient with chronic disease management and/or care coordination needs related to:  HTN, CHF, CKD III .   Engaged with patient by telephone for follow up visit in response to provider referral for social work chronic care management and care coordination services.   Consent to Services:  The patient was given information about Chronic Care Management services, agreed to services, and gave verbal consent prior to initiation of services.  Please see initial visit note for detailed documentation.   Patient agreed to services and consent obtained.   Assessment: Review of patient past medical history, allergies, medications, and health status, including review of relevant consultants reports was performed today as part of a comprehensive evaluation and provision of chronic care management and care coordination services.     SDOH (Social Determinants of Health) assessments and interventions performed:    Advanced Directives Status: Not addressed in this encounter.  CCM Care Plan  Allergies  Allergen Reactions   Lisinopril Cough    Outpatient Encounter Medications as of 01/17/2022  Medication Sig   acetaminophen (TYLENOL) 325 MG tablet Take 650 mg by mouth every 6 (six) hours as needed for mild pain or headache.   amLODipine (NORVASC) 10 MG tablet Take 1 tablet (10 mg total) by mouth daily.   aspirin 81 MG chewable tablet Chew 81 mg by mouth daily.   atorvastatin (LIPITOR) 40 MG tablet Take 1 tablet (40 mg total) by mouth daily.   FARXIGA 10 MG TABS tablet Take 1 tablet (10 mg total) by mouth daily.   losartan (COZAAR) 50 MG tablet Take 1 tablet (50 mg total) by mouth daily. (Patient not taking: Reported on 08/23/2021)   olmesartan (BENICAR)  40 MG tablet Take 40 mg by mouth daily. (Patient not taking: Reported on 09/08/2021)   spironolactone (ALDACTONE) 25 MG tablet Take 25 mg by mouth daily.   No facility-administered encounter medications on file as of 01/17/2022.    Patient Active Problem List   Diagnosis Date Noted   Adrenal adenoma 01/11/2021   Left ventricular hypertrophy 01/11/2021   Substance-induced disorder ([place]) 12/05/2020   Cocaine use disorder ([place]) 12/05/2020   Atypical angina ([place]) 10/11/2020   Stress 02/27/2019   Hypoglycemia without diagnosis of diabetes mellitus 09/06/2018   CKD (chronic kidney disease) stage 3, GFR 30-59 ml/min (HCC) 09/06/2018   Abdominal pain 09/06/2018   Chest pain 09/05/2018   Hypertensive urgency 09/05/2018   Wrist joint effusion, right 05/30/2017   Hypertension due to endocrine disorder 01/17/2017   Hyperaldosteronism ([place]) 01/17/2017   AKI (acute kidney injury) ([place]) 10/19/2015   Varicose veins of left lower extremity with complications 18/98/4210   Anemia 12/25/2014   Encounter for preventive care 11/11/2014   Tobacco abuse 31/28/1188   Diastolic dysfunction without heart failure 06/11/2013   GERD (gastroesophageal reflux disease) 06/11/2013   Severe uncontrolled hypertension 05/12/2013   Hyperlipemia 05/12/2013    Conditions to be addressed/monitored: CHF, HTN, and CKD Stage III ; Transportation  Care Plan : Social Work Plan of Care  Updates made by Daneen Schick since 01/17/2022 12:00 AM  Completed 01/17/2022   Problem: Barriers to Treatment Resolved 01/17/2022     Goal: Barriers to Treatment Identified and Managed Completed 01/17/2022  Start Date: 12/26/2021  Priority:  High  Note:   Current Barriers:  Chronic disease management support and education needs related to CHF, HTN, and CKD Stage III   Transportation  Social Worker Clinical Goal(s):  patient will work with SW to identify and address any acute and/or chronic care coordination needs related to the self  health management of CHF, HTN, and CKD Stage III   explore community resource options for unmet needs related to:  Transportation SW Interventions:  Inter-disciplinary care team collaboration (see longitudinal plan of care) Collaboration with [name], FNP regarding development and update of comprehensive plan of care as evidenced by provider attestation and co-signature Collaboration with [place] who requests SW assistance with transportation needs due to patient recently canceling an appointment due to lack of transportation Telephonic visit completed with the patient to assess goal progression Confirmed receipt of mailed resource Reviewed with the patient how to arrange transportation via [place] benefit Assessed for alternative care coordination needs- none identified Goal Met  Patient Goals/Self-Care Activities patient will:   -  Utilize health plan transportation benefit as needed for medical appointments -Contact SW as needed         Follow Up Plan:  No SW follow up planned at this time. The patient will remain engaged with RN Care Manager to address care management needs. SW is available as needed to assist with care coordination needs.      Daneen Schick, BSW, CDP Social Worker, Certified Dementia Practitioner [facility] [phone]

## 2022-01-18 DIAGNOSIS — I130 Hypertensive heart and chronic kidney disease with heart failure and stage 1 through stage 4 chronic kidney disease, or unspecified chronic kidney disease: Secondary | ICD-10-CM

## 2022-01-18 DIAGNOSIS — N1831 Chronic kidney disease, stage 3a: Secondary | ICD-10-CM

## 2022-01-18 DIAGNOSIS — I509 Heart failure, unspecified: Secondary | ICD-10-CM

## 2022-01-26 ENCOUNTER — Ambulatory Visit: Payer: Self-pay

## 2022-01-26 ENCOUNTER — Telehealth: Payer: Medicare Other

## 2022-01-26 DIAGNOSIS — E782 Mixed hyperlipidemia: Secondary | ICD-10-CM

## 2022-01-26 DIAGNOSIS — E269 Hyperaldosteronism, unspecified: Secondary | ICD-10-CM

## 2022-01-26 DIAGNOSIS — I5189 Other ill-defined heart diseases: Secondary | ICD-10-CM

## 2022-01-26 DIAGNOSIS — I152 Hypertension secondary to endocrine disorders: Secondary | ICD-10-CM

## 2022-01-26 DIAGNOSIS — I872 Venous insufficiency (chronic) (peripheral): Secondary | ICD-10-CM

## 2022-01-26 DIAGNOSIS — N1831 Chronic kidney disease, stage 3a: Secondary | ICD-10-CM

## 2022-01-26 NOTE — Chronic Care Management (AMB) (Signed)
  Care Management   Follow Up Note   01/26/2022 Name: [name] MRN: [id number] DOB: 14-Jun-1961   Referred by: [name], FNP Reason for referral : Chronic Care Management (RN CM follow up call #3 )   Third unsuccessful telephone outreach was attempted today. The patient was referred to the case management team for assistance with care management and care coordination. The patient's primary care provider has been notified of our unsuccessful attempts to make or maintain contact with the patient. The care management team is pleased to engage with this patient at any time in the future should he/she be interested in assistance from the care management team.   Follow Up Plan: We have been unable to make contact with the patient for follow up. The care management team is available to follow up with the patient after provider conversation with the patient regarding recommendation for care management engagement and subsequent re-referral to the care management team.   Barb Merino, RN, BSN, CCM Care Management Coordinator [facility]  Direct Phone: [phone]

## 2022-02-03 DIAGNOSIS — I129 Hypertensive chronic kidney disease with stage 1 through stage 4 chronic kidney disease, or unspecified chronic kidney disease: Secondary | ICD-10-CM | POA: Diagnosis not present

## 2022-02-03 DIAGNOSIS — R809 Proteinuria, unspecified: Secondary | ICD-10-CM | POA: Diagnosis not present

## 2022-02-03 DIAGNOSIS — E269 Hyperaldosteronism, unspecified: Secondary | ICD-10-CM | POA: Diagnosis not present

## 2022-02-03 DIAGNOSIS — L0390 Cellulitis, unspecified: Secondary | ICD-10-CM | POA: Diagnosis not present

## 2022-02-03 DIAGNOSIS — N1832 Chronic kidney disease, stage 3b: Secondary | ICD-10-CM | POA: Diagnosis not present

## 2022-02-03 DIAGNOSIS — D631 Anemia in chronic kidney disease: Secondary | ICD-10-CM | POA: Diagnosis not present

## 2022-02-03 DIAGNOSIS — R609 Edema, unspecified: Secondary | ICD-10-CM | POA: Diagnosis not present

## 2022-02-03 DIAGNOSIS — N2581 Secondary hyperparathyroidism of renal origin: Secondary | ICD-10-CM | POA: Diagnosis not present

## 2022-04-06 NOTE — Progress Notes (Signed)
This encounter was created in error - please disregard.

## 2022-04-08 ENCOUNTER — Encounter (HOSPITAL_COMMUNITY): Payer: Self-pay | Admitting: Emergency Medicine

## 2022-04-08 ENCOUNTER — Other Ambulatory Visit: Payer: Self-pay

## 2022-04-08 ENCOUNTER — Emergency Department (HOSPITAL_COMMUNITY)
Admission: EM | Admit: 2022-04-08 | Discharge: 2022-04-08 | Disposition: A | Discharge status: Home / Self Care | Payer: Medicare Other | Attending: Emergency Medicine | Admitting: Emergency Medicine

## 2022-04-08 DIAGNOSIS — Z7982 Long term (current) use of aspirin: Secondary | ICD-10-CM | POA: Diagnosis not present

## 2022-04-08 DIAGNOSIS — R6889 Other general symptoms and signs: Secondary | ICD-10-CM | POA: Diagnosis not present

## 2022-04-08 DIAGNOSIS — I10 Essential (primary) hypertension: Secondary | ICD-10-CM

## 2022-04-08 DIAGNOSIS — N189 Chronic kidney disease, unspecified: Secondary | ICD-10-CM | POA: Insufficient documentation

## 2022-04-08 DIAGNOSIS — I129 Hypertensive chronic kidney disease with stage 1 through stage 4 chronic kidney disease, or unspecified chronic kidney disease: Secondary | ICD-10-CM | POA: Insufficient documentation

## 2022-04-08 DIAGNOSIS — Z79899 Other long term (current) drug therapy: Secondary | ICD-10-CM | POA: Diagnosis not present

## 2022-04-08 DIAGNOSIS — I4430 Unspecified atrioventricular block: Secondary | ICD-10-CM | POA: Diagnosis not present

## 2022-04-08 DIAGNOSIS — L97929 Non-pressure chronic ulcer of unspecified part of left lower leg with unspecified severity: Secondary | ICD-10-CM | POA: Insufficient documentation

## 2022-04-08 DIAGNOSIS — L97529 Non-pressure chronic ulcer of other part of left foot with unspecified severity: Secondary | ICD-10-CM | POA: Diagnosis not present

## 2022-04-08 DIAGNOSIS — L98499 Non-pressure chronic ulcer of skin of other sites with unspecified severity: Secondary | ICD-10-CM

## 2022-04-08 DIAGNOSIS — E11621 Type 2 diabetes mellitus with foot ulcer: Secondary | ICD-10-CM | POA: Diagnosis not present

## 2022-04-08 DIAGNOSIS — R000 Tachycardia, unspecified: Secondary | ICD-10-CM | POA: Diagnosis not present

## 2022-04-08 DIAGNOSIS — M79605 Pain in left leg: Secondary | ICD-10-CM | POA: Diagnosis present

## 2022-04-08 DIAGNOSIS — I499 Cardiac arrhythmia, unspecified: Secondary | ICD-10-CM | POA: Diagnosis not present

## 2022-04-08 LAB — COMPREHENSIVE METABOLIC PANEL
ALT: 12 U/L (ref 0–44)
AST: 16 U/L (ref 15–41)
Albumin: 3.4 g/dL — ABNORMAL LOW (ref 3.5–5.0)
Alkaline Phosphatase: 58 U/L (ref 38–126)
Anion gap: 9 (ref 5–15)
BUN: 34 mg/dL — ABNORMAL HIGH (ref 8–23)
CO2: 23 mmol/L (ref 22–32)
Calcium: 8.5 mg/dL — ABNORMAL LOW (ref 8.9–10.3)
Chloride: 104 mmol/L (ref 98–111)
Creatinine, Ser: 1.74 mg/dL — ABNORMAL HIGH (ref 0.44–1.00)
GFR, Estimated: 33 mL/min — ABNORMAL LOW (ref >60)
Glucose, Bld: 86 mg/dL (ref 70–99)
Potassium: 3.3 mmol/L — ABNORMAL LOW (ref 3.5–5.1)
Sodium: 136 mmol/L (ref 135–145)
Total Bilirubin: 0.6 mg/dL (ref 0.3–1.2)
Total Protein: 7.6 g/dL (ref 6.5–8.1)

## 2022-04-08 LAB — CBC WITH DIFFERENTIAL/PLATELET
Abs Immature Granulocytes: 0.01 10*3/uL (ref 0.00–0.07)
Basophils Absolute: 0 10*3/uL (ref 0.0–0.1)
Basophils Relative: 0 %
Eosinophils Absolute: 0 10*3/uL (ref 0.0–0.5)
Eosinophils Relative: 1 %
HCT: 32.2 % — ABNORMAL LOW (ref 36.0–46.0)
Hemoglobin: 10 g/dL — ABNORMAL LOW (ref 12.0–15.0)
Immature Granulocytes: 0 %
Lymphocytes Relative: 19 %
Lymphs Abs: 1.1 10*3/uL (ref 0.7–4.0)
MCH: 29 pg (ref 26.0–34.0)
MCHC: 31.1 g/dL (ref 30.0–36.0)
MCV: 93.3 fL (ref 80.0–100.0)
Monocytes Absolute: 0.6 10*3/uL (ref 0.1–1.0)
Monocytes Relative: 11 %
Neutro Abs: 3.7 10*3/uL (ref 1.7–7.7)
Neutrophils Relative %: 69 %
Platelets: 290 10*3/uL (ref 150–400)
RBC: 3.45 MIL/uL — ABNORMAL LOW (ref 3.87–5.11)
RDW: 13.2 % (ref 11.5–15.5)
WBC: 5.4 10*3/uL (ref 4.0–10.5)
nRBC: 0 % (ref 0.0–0.2)

## 2022-04-08 MED ORDER — CLINDAMYCIN HCL 150 MG PO CAPS: 150.0000 mg | ORAL_CAPSULE | Freq: Four times a day (QID) | ORAL | 0 refills | Status: DC

## 2022-04-08 NOTE — ED Triage Notes (Signed)
Patient from home with high blood pressure and leg wound on left lateral calf that she has been dressing herself at home.  She is on antibiotics for the wound.  220/100 per EMS.  Patient is on HTN meds, takes in the morning.  No chest pain, no shortness of breath but does have some dizziness.  No nausea or vomiting or HA.

## 2022-04-08 NOTE — ED Provider Notes (Signed)
[facility] EMERGENCY DEPARTMENT Provider Note   CSN: [id number] Arrival date & time: 04/08/22  0258     History  Chief Complaint  Patient presents with   Hypertension   Leg wound    [name] is a 61 y.o. female.  61 year old female with prior medical history as detailed below presents for evaluation.  Patient with history of chronic kidney disease, high blood pressure, and chronic wound to the left calf.  Patient reports that her left leg has been more painful over the last 1 to 2 weeks.  Patient reports that she is already planning on discussing her leg with wound management clinic.  She has an appointment this coming Tuesday with her nephrologist. She reports compliance with her blood pressure medications.  She reports that her blood pressure has been persistently into the 009F to 818E systolic over the last week. She reports mildly increased pain to the left leg.  She reports that she was on a course of antibiotics approximately a month and a half ago for treatment of possible infection in the leg.  She is requesting another course of antibiotics today.  She denies fever, chills, nausea, vomiting, chest pain, shortness of breath, abdominal pain, other complaint.  She denies headache or vision change.  She denies weakness.  She is ambulating without difficulty.    The history is provided by the patient and medical records.       Home Medications Prior to Admission medications   Medication Sig Start Date End Date Taking? Authorizing Provider  clindamycin (CLEOCIN) 150 MG capsule Take 1 capsule (150 mg total) by mouth every 6 (six) hours. 04/08/22  Yes Valarie Merino, MD  acetaminophen (TYLENOL) 325 MG tablet Take 650 mg by mouth every 6 (six) hours as needed for mild pain or headache.    [provider]  amLODipine (NORVASC) 10 MG tablet Take 1 tablet (10 mg total) by mouth daily. 10/26/21   [name], FNP  aspirin 81 MG chewable tablet  Chew 81 mg by mouth daily.    [provider]  atorvastatin (LIPITOR) 40 MG tablet Take 1 tablet (40 mg total) by mouth daily. 10/26/21   [name], FNP  FARXIGA 10 MG TABS tablet Take 1 tablet (10 mg total) by mouth daily. 10/26/21   [name], FNP  losartan (COZAAR) 50 MG tablet Take 1 tablet (50 mg total) by mouth daily. Patient not taking: Reported on 08/23/2021 10/11/20 08/23/21  [name], FNP  olmesartan (BENICAR) 40 MG tablet Take 40 mg by mouth daily. Patient not taking: Reported on 09/08/2021    [provider]  spironolactone (ALDACTONE) 25 MG tablet Take 25 mg by mouth daily.    [provider]      Allergies    Lisinopril    Review of Systems   Review of Systems  All other systems reviewed and are negative.   Physical Exam Updated Vital Signs BP (!) 216/96 (BP Location: Left Arm)   Pulse 65   Temp 98.6 F (37 C) (Oral)   Resp 19   SpO2 96%  Physical Exam Vitals and nursing note reviewed.  Constitutional:      General: She is not in acute distress.    Appearance: Normal appearance. She is well-developed.  HENT:     Head: Normocephalic and atraumatic.  Eyes:     Conjunctiva/sclera: Conjunctivae normal.     Pupils: Pupils are equal, round, and reactive to light.  Cardiovascular:  Rate and Rhythm: Normal rate and regular rhythm.     Heart sounds: Normal heart sounds.  Pulmonary:     Effort: Pulmonary effort is normal. No respiratory distress.     Breath sounds: Normal breath sounds.  Abdominal:     General: There is no distension.     Palpations: Abdomen is soft.     Tenderness: There is no abdominal tenderness.  Musculoskeletal:        General: No deformity. Normal range of motion.     Cervical back: Normal range of motion and neck supple.  Skin:    General: Skin is warm and dry.     Comments: Superficial skin ulceration to the left lateral calf is noted.  Minimal to no erythema.  No significant drainage noted.  Wound  appears to be chronic in nature.  Neurological:     General: No focal deficit present.     Mental Status: She is alert and oriented to person, place, and time.     ED Results / Procedures / Treatments   Labs (all labs ordered are listed, but only abnormal results are displayed) Labs Reviewed  CBC WITH DIFFERENTIAL/PLATELET - Abnormal; Notable for the following components:      Result Value   RBC 3.45 (*)    Hemoglobin 10.0 (*)    HCT 32.2 (*)    All other components within normal limits  COMPREHENSIVE METABOLIC PANEL - Abnormal; Notable for the following components:   Potassium 3.3 (*)    BUN 34 (*)    Creatinine, Ser 1.74 (*)    Calcium 8.5 (*)    Albumin 3.4 (*)    GFR, Estimated 33 (*)    All other components within normal limits    EKG None  Radiology No results found.  Procedures Procedures    Medications Ordered in ED Medications - No data to display  ED Course/ Medical Decision Making/ A&P                           Medical Decision Making Amount and/or Complexity of Data Reviewed Labs: ordered.  Risk Prescription drug management.    Medical Screen Complete  This patient presented to the ED with complaint of skin ulceration, hypertension.  This complaint involves an extensive number of treatment options. The initial differential diagnosis includes, but is not limited to, cellulitis, metabolic abnormality, evidence of endorgan damage from hypertension  This presentation is: Acute, Chronic, Self-Limited, Previously Undiagnosed, Uncertain Prognosis, Complicated, Systemic Symptoms, and Threat to Life/Bodily Function  Patient with longstanding history of hypertension, chronic kidney disease, leg ulceration presents with complaint of mildly increased left leg pain and elevated blood pressures.  Patient with chronic history of hypertension.  Exam and work-up are without evidence of significant endorgan damage from her hypertension.  She does have  established care in the outpatient setting with nephrology.  She has an appointment scheduled for 3 days from today with her primary nephrologist.  She plans on discussing her hypertension with her nephrologist at this visit.  Patient's left leg wound is chronic in nature.  Patient is requesting her another round of antibiotics to treat possible early infection.  Patient's exam is not suggestive of cellulitis.  White count is 5.4.  Patient without reported or documented fever.  We will provide the patient with a course of antibiotics per her request.  Importance of close follow-up is stressed.  Strict return precautions given and understood.   Additional  history obtained:  External records from outside sources obtained and reviewed including prior ED visits and prior Inpatient records.    Lab Tests:  I ordered and personally interpreted labs.  The pertinent results include: CBC, CMP  Problem List / ED Course:  Hypertension, chronic leg wound   Reevaluation:  After the interventions noted above, I reevaluated the patient and found that they have: improved   Disposition:  After consideration of the diagnostic results and the patients response to treatment, I feel that the patent would benefit from close outpatient follow up.          Final Clinical Impression(s) / ED Diagnoses Final diagnoses:  Hypertension, unspecified type  Skin ulcer, unspecified ulcer stage ([place])    Rx / DC Orders ED Discharge Orders          Ordered    clindamycin (CLEOCIN) 150 MG capsule  Every 6 hours        04/08/22 1153              Valarie Merino, MD 04/08/22 1159

## 2022-04-08 NOTE — Discharge Instructions (Addendum)
Return for any problem.  ?

## 2022-04-11 DIAGNOSIS — D631 Anemia in chronic kidney disease: Secondary | ICD-10-CM | POA: Diagnosis not present

## 2022-04-11 DIAGNOSIS — N2581 Secondary hyperparathyroidism of renal origin: Secondary | ICD-10-CM | POA: Diagnosis not present

## 2022-04-11 DIAGNOSIS — I129 Hypertensive chronic kidney disease with stage 1 through stage 4 chronic kidney disease, or unspecified chronic kidney disease: Secondary | ICD-10-CM | POA: Diagnosis not present

## 2022-04-11 DIAGNOSIS — N1832 Chronic kidney disease, stage 3b: Secondary | ICD-10-CM | POA: Diagnosis not present

## 2022-04-11 DIAGNOSIS — E269 Hyperaldosteronism, unspecified: Secondary | ICD-10-CM | POA: Diagnosis not present

## 2022-04-11 DIAGNOSIS — L0390 Cellulitis, unspecified: Secondary | ICD-10-CM | POA: Diagnosis not present

## 2022-04-11 DIAGNOSIS — R809 Proteinuria, unspecified: Secondary | ICD-10-CM | POA: Diagnosis not present

## 2022-04-13 ENCOUNTER — Telehealth: Payer: Self-pay

## 2022-04-13 NOTE — Telephone Encounter (Signed)
Transition Care Management Unsuccessful Follow-up Telephone Call  Date of discharge and from where:  04/08/2022 [facility]  Attempts:  1st Attempt  Reason for unsuccessful TCM follow-up call:  Left voice message

## 2022-04-20 ENCOUNTER — Telehealth: Payer: Self-pay

## 2022-04-20 NOTE — Telephone Encounter (Signed)
Transition Care Management Follow-up Telephone Call Date of discharge and from where: 04/10/2022 [place]  How have you been since you were released from the hospital? Pt states she is doing better, but has a wound on her leg that she is concerned about.  Any questions or concerns? No  Items Reviewed: Did the pt receive and understand the discharge instructions provided? No  Medications obtained and verified? Yes  Other? Yes  Any new allergies since your discharge? No  Dietary orders reviewed? Yes Do you have support at home? Yes   Home Care and Equipment/Supplies: Were home health services ordered? no If so, what is the name of the agency? N/a  Has the agency set up a time to come to the patient's home? no Were any new equipment or medical supplies ordered?  No What is the name of the medical supply agency? N/a Were you able to get the supplies/equipment? no Do you have any questions related to the use of the equipment or supplies? No  Functional Questionnaire: (I = Independent and D = Dependent) ADLs: i  Bathing/Dressing- i  Meal Prep- i  Eating- i  Maintaining continence- i  Transferring/Ambulation- i  Managing Meds- i  Follow up appointments reviewed:  PCP Hospital f/u appt confirmed? Yes  Scheduled to see [name] on September 12th @ [facility] f/u appt confirmed? No  Scheduled to see n/a on n/a @ n/a. Are transportation arrangements needed? No  If their condition worsens, is the pt aware to call PCP or go to the Emergency Dept.? Yes Was the patient provided with contact information for the PCP's office or ED? Yes Was to pt encouraged to call back with questions or concerns? Yes

## 2022-05-02 ENCOUNTER — Ambulatory Visit (INDEPENDENT_AMBULATORY_CARE_PROVIDER_SITE_OTHER): Payer: Medicare Other | Admitting: Nurse Practitioner

## 2022-05-02 ENCOUNTER — Encounter: Payer: Self-pay | Admitting: Nurse Practitioner

## 2022-05-02 VITALS — BP 168/98 | HR 80 | Temp 98.5°F | Ht 68.0 in | Wt 193.0 lb

## 2022-05-02 DIAGNOSIS — R200 Anesthesia of skin: Secondary | ICD-10-CM | POA: Diagnosis not present

## 2022-05-02 DIAGNOSIS — I119 Hypertensive heart disease without heart failure: Secondary | ICD-10-CM

## 2022-05-02 DIAGNOSIS — Z1231 Encounter for screening mammogram for malignant neoplasm of breast: Secondary | ICD-10-CM | POA: Diagnosis not present

## 2022-05-02 DIAGNOSIS — I152 Hypertension secondary to endocrine disorders: Secondary | ICD-10-CM | POA: Diagnosis not present

## 2022-05-02 DIAGNOSIS — Z1159 Encounter for screening for other viral diseases: Secondary | ICD-10-CM

## 2022-05-02 DIAGNOSIS — E782 Mixed hyperlipidemia: Secondary | ICD-10-CM

## 2022-05-02 DIAGNOSIS — R202 Paresthesia of skin: Secondary | ICD-10-CM

## 2022-05-02 DIAGNOSIS — I872 Venous insufficiency (chronic) (peripheral): Secondary | ICD-10-CM | POA: Diagnosis not present

## 2022-05-02 DIAGNOSIS — N1831 Chronic kidney disease, stage 3a: Secondary | ICD-10-CM | POA: Diagnosis not present

## 2022-05-02 DIAGNOSIS — Z23 Encounter for immunization: Secondary | ICD-10-CM

## 2022-05-02 MED ORDER — GABAPENTIN 100 MG PO CAPS: 100.0000 mg | ORAL_CAPSULE | Freq: Every day | ORAL | 1 refills | Status: DC

## 2022-05-02 MED ORDER — TETANUS-DIPHTH-ACELL PERTUSSIS 5-2.5-18.5 LF-MCG/0.5 IM SUSP: 0.5000 mL | Freq: Once | INTRAMUSCULAR | 0 refills | Status: AC

## 2022-05-02 NOTE — Patient Instructions (Signed)

## 2022-05-02 NOTE — Progress Notes (Signed)
I,[name],acting as a Education administrator for [employer], FNP.,have documented all relevant documentation on the behalf of Summer Brine, FNP,as directed by  Summer Brine, FNP while in the presence of [name], [place].  Subjective:     Patient ID: [name] , female    DOB: [birth year] , 61 y.o.   MRN: [id number]   Chief Complaint  Patient presents with   Follow-up    HPI  Patient presents today for ED follow up from last month. She reports having pain in her legs and continues to have a vascular wound to her left leg. Has seen vein and vascular and thought to have pain related to possible neuropathy or stenosis according to vein and vascular. She may need a EMG/NCV or lumbar spine xray/MRI. She does have a history of PAD.   Hypertension This is a chronic problem. The current episode started more than 1 year ago. The problem has been gradually worsening since onset. The problem is uncontrolled. Pertinent negatives include no anxiety, blurred vision, chest pain, headaches, palpitations or peripheral edema. Risk factors for coronary artery disease include obesity and sedentary lifestyle. Past treatments include angiotensin blockers, diuretics and calcium channel blockers. There are no compliance problems.  There is no history of angina or kidney disease. There is no history of chronic renal disease or sleep apnea.     Past Medical History:  Diagnosis Date   Adrenal adenoma 01/11/2021   Anemia    CHF (congestive heart failure) (HCC)    Chronic bronchitis ([place])    "probably once/yr" (5/46/5035)   Diastolic heart failure    Dyslipidemia    pt denies this hx on 05/12/2013   Exertional shortness of breath    GERD (gastroesophageal reflux disease)    Hypertension    Hypokalemia    Left ventricular hypertrophy 01/11/2021   Venous stasis ulcers ([place])      Family History  Problem Relation Age of Onset   Hypertension Mother    Kidney failure Mother    Heart attack Father    Hypertension  Sister    Diabetes Sister    Hypertension Brother    Diabetes Sister      Current Outpatient Medications:    acetaminophen (TYLENOL) 325 MG tablet, Take 650 mg by mouth every 6 (six) hours as needed for mild pain or headache., Disp: , Rfl:    aspirin 81 MG chewable tablet, Chew 81 mg by mouth daily., Disp: , Rfl:    atorvastatin (LIPITOR) 40 MG tablet, Take 1 tablet (40 mg total) by mouth daily., Disp: 90 tablet, Rfl: 1   clindamycin (CLEOCIN) 150 MG capsule, Take 1 capsule (150 mg total) by mouth every 6 (six) hours., Disp: 28 capsule, Rfl: 0   FARXIGA 10 MG TABS tablet, Take 1 tablet (10 mg total) by mouth daily., Disp: 90 tablet, Rfl: 0   gabapentin (NEURONTIN) 100 MG capsule, Take 1 capsule (100 mg total) by mouth at bedtime., Disp: 90 capsule, Rfl: 1   olmesartan (BENICAR) 40 MG tablet, Take 40 mg by mouth daily., Disp: , Rfl:    spironolactone (ALDACTONE) 25 MG tablet, Take 25 mg by mouth daily., Disp: , Rfl:    telmisartan (MICARDIS) 80 MG tablet, Take 80 mg by mouth daily., Disp: , Rfl:    amLODipine (NORVASC) 10 MG tablet, Take 1 tablet (10 mg total) by mouth daily., Disp: 90 tablet, Rfl: 0   Allergies  Allergen Reactions   Lisinopril Cough     Review of Systems  Constitutional: Negative.   Eyes:  Negative for blurred vision.  Respiratory: Negative.    Cardiovascular: Negative.  Negative for chest pain and palpitations.  Gastrointestinal: Negative.   Musculoskeletal: Negative.   Skin:  Positive for wound.  Neurological: Negative.  Negative for headaches.  Psychiatric/Behavioral: Negative.       Today's Vitals   05/02/22 1116  BP: (!) 168/98  Pulse: 80  Temp: 98.5 F (36.9 C)  TempSrc: Oral  Weight: 193 lb (87.5 kg)  Height: _0  (1.727 m)   Body mass index is 29.35 kg/m.  Wt Readings from Last 3 Encounters:  05/02/22 193 lb (87.5 kg)  09/20/21 186 lb (84.4 kg)  09/08/21 198 lb (89.8 kg)    Objective:  Physical Exam Vitals reviewed.  Constitutional:       Appearance: Normal appearance.  Cardiovascular:     Rate and Rhythm: Normal rate and regular rhythm.     Pulses: Normal pulses.     Heart sounds: Normal heart sounds. No murmur heard. Pulmonary:     Effort: Pulmonary effort is normal. No respiratory distress.     Breath sounds: Normal breath sounds. No stridor. No wheezing.  Musculoskeletal:        General: Swelling present. No tenderness.  Skin:    General: Skin is warm and dry.     Comments: Slightly open scaly wound.   Neurological:     General: No focal deficit present.     Mental Status: She is alert and oriented to person, place, and time.  Psychiatric:        Mood and Affect: Mood normal.        Behavior: Behavior normal.        Thought Content: Thought content normal.        Judgment: Judgment normal.         Assessment And Plan:     1. Hypertension due to endocrine disorder Comments: She is advised to take her blood pressure medications as directed.  - BMP8+EGFR  2. Stage 3a chronic kidney disease ([place])  3. Mixed hyperlipidemia Comments: Stable, continue statin - Lipid panel  4. Need for Tdap vaccination Will give tetanus vaccine today while in office. Refer to order management. TDAP will be administered to adults 23-29 years old every 10 years. - Tdap (BOOSTRIX) 5-2.5-18.5 LF-MCG/0.5 injection; Inject 0.5 mLs into the muscle once for 1 dose.  Dispense: 0.5 mL; Refill: 0  5. Encounter for hepatitis C screening test for low risk patient Will check Hepatitis C screening due to recent recommendations to screen all adults 18 years and older - Hepatitis C antibody  6. Numbness and tingling of both lower extremities Comments: Will treat with gabapentin, and consider doing an EMG/NCV or lower back MRI.  - Vitamin B12 - gabapentin (NEURONTIN) 100 MG capsule; Take 1 capsule (100 mg total) by mouth at bedtime.  Dispense: 90 capsule; Refill: 1  7. Venous stasis dermatitis of left lower extremity Comments: Will  send to [place] she may benefit from [place] due to the swelling to her lower extremities - Ambulatory referral to Wound Clinic  8. Encounter for screening mammogram for breast cancer - MM Digital Screening; Future  9. Need for influenza vaccination - Flu Vaccine QUAD 6+ mos PF IM (Fluarix Quad PF)  10. Severe uncontrolled hypertension - amLODipine (NORVASC) 10 MG tablet; Take 1 tablet (10 mg total) by mouth daily.  Dispense: 90 tablet; Refill: 0     Patient was given opportunity to  ask questions. Patient verbalized understanding of the plan and was able to repeat key elements of the plan. All questions were answered to their satisfaction.  Summer Brine, FNP   I, Summer Brine, FNP, have reviewed all documentation for this visit. The documentation on 05/02/22 for the exam, diagnosis, procedures, and orders are all accurate and complete.   IF YOU HAVE BEEN REFERRED TO A SPECIALIST, IT MAY TAKE 1-2 WEEKS TO SCHEDULE/PROCESS THE REFERRAL. IF YOU HAVE NOT HEARD FROM US/SPECIALIST IN TWO WEEKS, PLEASE GIVE [place] A CALL AT [phone].   THE PATIENT IS ENCOURAGED TO PRACTICE SOCIAL DISTANCING DUE TO THE COVID-19 PANDEMIC.

## 2022-05-03 LAB — LIPID PANEL
Chol/HDL Ratio: 2.3 ratio (ref 0.0–4.4)
Cholesterol, Total: 146 mg/dL (ref 100–199)
HDL: 63 mg/dL (ref >39)
LDL Chol Calc (NIH): 74 mg/dL (ref 0–99)
Triglycerides: 39 mg/dL (ref 0–149)
VLDL Cholesterol Cal: 9 mg/dL (ref 5–40)

## 2022-05-03 LAB — VITAMIN B12: Vitamin B-12: 516 pg/mL (ref 232–1245)

## 2022-05-03 LAB — BMP8+EGFR
BUN/Creatinine Ratio: 23 (ref 12–28)
BUN: 42 mg/dL — ABNORMAL HIGH (ref 8–27)
CO2: 20 mmol/L (ref 20–29)
Calcium: 9 mg/dL (ref 8.7–10.3)
Chloride: 103 mmol/L (ref 96–106)
Creatinine, Ser: 1.83 mg/dL — ABNORMAL HIGH (ref 0.57–1.00)
Glucose: 88 mg/dL (ref 70–99)
Potassium: 4.8 mmol/L (ref 3.5–5.2)
Sodium: 138 mmol/L (ref 134–144)
eGFR: 31 mL/min/{1.73_m2} — ABNORMAL LOW (ref >59)

## 2022-05-03 LAB — HEPATITIS C ANTIBODY: Hep C Virus Ab: NONREACTIVE

## 2022-05-07 MED ORDER — AMLODIPINE BESYLATE 10 MG PO TABS: 10.0000 mg | ORAL_TABLET | Freq: Every day | ORAL | 0 refills | Status: DC

## 2022-05-08 NOTE — Progress Notes (Signed)
She needs to schedule an appt with the Cardiologist

## 2022-05-09 DIAGNOSIS — N1832 Chronic kidney disease, stage 3b: Secondary | ICD-10-CM | POA: Diagnosis not present

## 2022-05-19 ENCOUNTER — Encounter (HOSPITAL_BASED_OUTPATIENT_CLINIC_OR_DEPARTMENT_OTHER): Payer: Medicare Other | Attending: General Surgery | Admitting: General Surgery

## 2022-05-19 DIAGNOSIS — I872 Venous insufficiency (chronic) (peripheral): Secondary | ICD-10-CM | POA: Diagnosis not present

## 2022-05-19 DIAGNOSIS — L97822 Non-pressure chronic ulcer of other part of left lower leg with fat layer exposed: Secondary | ICD-10-CM | POA: Insufficient documentation

## 2022-05-19 DIAGNOSIS — I5032 Chronic diastolic (congestive) heart failure: Secondary | ICD-10-CM | POA: Diagnosis not present

## 2022-05-19 DIAGNOSIS — E2609 Other primary hyperaldosteronism: Secondary | ICD-10-CM | POA: Insufficient documentation

## 2022-05-19 DIAGNOSIS — I83892 Varicose veins of left lower extremities with other complications: Secondary | ICD-10-CM | POA: Insufficient documentation

## 2022-05-19 DIAGNOSIS — E876 Hypokalemia: Secondary | ICD-10-CM | POA: Insufficient documentation

## 2022-05-19 DIAGNOSIS — F17210 Nicotine dependence, cigarettes, uncomplicated: Secondary | ICD-10-CM | POA: Insufficient documentation

## 2022-05-19 DIAGNOSIS — N183 Chronic kidney disease, stage 3 unspecified: Secondary | ICD-10-CM | POA: Insufficient documentation

## 2022-05-19 DIAGNOSIS — I130 Hypertensive heart and chronic kidney disease with heart failure and stage 1 through stage 4 chronic kidney disease, or unspecified chronic kidney disease: Secondary | ICD-10-CM | POA: Insufficient documentation

## 2022-05-19 NOTE — Progress Notes (Signed)
Summer, Chavez ([id number]) Visit Report for 05/19/2022 Abuse Risk Screen Details Patient Name: Date of Service: [name] RD, [place] RO Chavez 05/19/2022 9:00 A M Medical Record Number: [id number] Patient Account Number: [id number] Date of Birth/Sex: Treating RN: 1960-09-09 (61 y.o. [name], Summer Primary Care Hailey Miles: [name] Other Clinician: Referring Rajan Burgard: Treating Lafreda Casebeer/Extender: [name] in Treatment: 0 Abuse Risk Screen Items Answer ABUSE RISK SCREEN: Has anyone close to you tried to hurt or harm you recentlyo No Do you feel uncomfortable with anyone in your familyo No Has anyone forced you do things that you didnt want to doo No Electronic Signature(s) Signed: 05/19/2022 11:52:25 AM By: Rhae Hammock RN Entered By: Rhae Hammock on 05/19/2022 09:11:36 -------------------------------------------------------------------------------- Activities of Daily Living Details Patient Name: Date of Service: [name] 05/19/2022 9:00 A M Medical Record Number: [id number] Patient Account Number: [id number] Date of Birth/Sex: Treating RN: 1960-11-21 (61 y.o. [name], Summer Primary Care Teckla Christiansen: [name] Other Clinician: Referring Sandy Haye: Treating Rakel Junio/Extender: Burley Saver Weeks in Treatment: 0 Activities of Daily Living Items Answer Activities of Daily Living (Please select one for each item) Drive Automobile Not Able T Medications ake Completely Able Use T elephone Completely Able Care for Appearance Completely Able Use T oilet Completely Able Bath / Shower Completely Able Dress Self Completely Able Feed Self Completely Able Walk Completely Able Get In / Out Bed Completely Able Housework Completely Able Prepare Meals Completely Tat [place] for Self Completely Able Electronic Signature(s) Signed: 05/19/2022 11:52:25 AM By: Rhae Hammock RN Entered By:  Rhae Hammock on 05/19/2022 09:12:09 -------------------------------------------------------------------------------- Education Screening Details Patient Name: Date of Service: [name] 05/19/2022 9:00 A M Medical Record Number: [id number] Patient Account Number: [id number] Date of Birth/Sex: Treating RN: 06/13/1961 (61 y.o. [name], Summer Primary Care Shaquil Aldana: [name] Other Clinician: Referring Makynli Stills: Treating Willamina Grieshop/Extender: [name] in Treatment: 0 Primary Learner Assessed: Patient Learning Preferences/Education Level/Primary Language Learning Preference: Explanation, Demonstration, Communication Board, Printed Material Highest Education Level: College or Above Preferred Language: English Cognitive Barrier Language Barrier: No Translator Needed: No Memory Deficit: No Emotional Barrier: No Cultural/Religious Beliefs Affecting Medical Care: No Physical Barrier Impaired Vision: Yes Glasses Impaired Hearing: No Decreased Hand dexterity: No Knowledge/Comprehension Knowledge Level: High Comprehension Level: High Ability to understand written instructions: High Ability to understand verbal instructions: High Motivation Anxiety Level: Calm Cooperation: Cooperative Education Importance: Denies Need Interest in Health Problems: Asks Questions Perception: Coherent Willingness to Engage in Self-Management High Activities: Readiness to Engage in Self-Management High Activities: Electronic Signature(s) Signed: 05/19/2022 11:52:25 AM By: Rhae Hammock RN Entered By: Rhae Hammock on 05/19/2022 09:13:16 -------------------------------------------------------------------------------- Fall Risk Assessment Details Patient Name: Date of Service: [name] 05/19/2022 9:00 A M Medical Record Number: [id number] Patient Account Number: [id number] Date of Birth/Sex: Treating RN: 08-01-1961 (61 y.o. [name], Summer Primary Care Larnce Schnackenberg: [name] Other Clinician: Referring Katisha Shimizu: Treating Shaurya Rawdon/Extender: Burley Saver Weeks in Treatment: 0 Fall Risk Assessment Items Have you had 2 or more falls in the last 12 monthso 0 No Have you had any fall that resulted in injury in the last 12 monthso 0 No FALLS RISK SCREEN History of falling - immediate or within 3 months 0 No Secondary diagnosis (Do you have 2 or more medical diagnoseso) 0 No Ambulatory aid None/bed rest/wheelchair/nurse 0 No Crutches/cane/walker 0 No Furniture 0 No Intravenous therapy Access/Saline/Heparin Lock 0 No Gait/Transferring Normal/ bed  rest/ wheelchair 0 No Weak (short steps with or without shuffle, stooped but able to lift head while walking, may seek 0 No support from furniture) Impaired (short steps with shuffle, may have difficulty arising from chair, head down, impaired 0 No balance) Mental Status Oriented to own ability 0 No Electronic Signature(s) Signed: 05/19/2022 11:52:25 AM By: Rhae Hammock RN Entered By: Rhae Hammock on 05/19/2022 09:13:24 -------------------------------------------------------------------------------- Foot Assessment Details Patient Name: Date of Service: [name] 05/19/2022 9:00 A M Medical Record Number: [id number] Patient Account Number: [id number] Date of Birth/Sex: Treating RN: 1960-11-09 (61 y.o. [name], Summer Primary Care Glendel Jaggers: [name] Other Clinician: Referring Jalena Vanderlinden: Treating Soleil Mas/Extender: Burley Saver Weeks in Treatment: 0 Foot Assessment Items Site Locations + = Sensation present, - = Sensation absent, C = Callus, U = Ulcer R = Redness, W = Warmth, M = Maceration, PU = Pre-ulcerative lesion F = Fissure, S = Swelling, D = Dryness Assessment Right: Left: Other Deformity: No No Prior Foot Ulcer: No No Prior Amputation: No No Charcot Joint: No No Ambulatory  Status: Ambulatory Without Help Gait: Steady Electronic Signature(s) Signed: 05/19/2022 11:52:25 AM By: Rhae Hammock RN Entered By: Rhae Hammock on 05/19/2022 09:23:57 -------------------------------------------------------------------------------- Nutrition Risk Screening Details Patient Name: Date of Service: [name] 05/19/2022 9:00 A M Medical Record Number: [id number] Patient Account Number: [id number] Date of Birth/Sex: Treating RN: 1960-11-30 (61 y.o. [name], Summer Primary Care Cicilia Clinger: [name] Other Clinician: Referring Blong Busk: Treating Paislei Dorval/Extender: Burley Saver Weeks in Treatment: 0 Height (in): 68 Weight (lbs): 193 Body Mass Index (BMI): 29.3 Nutrition Risk Screening Items Score Screening NUTRITION RISK SCREEN: I have an illness or condition that made me change the kind and/or amount of food I eat 0 No I eat fewer than two meals per day 0 No I eat few fruits and vegetables, or milk products 0 No I have three or more drinks of beer, liquor or wine almost every day 0 No I have tooth or mouth problems that make it hard for me to eat 0 No I don't always have enough money to buy the food I need 0 No I eat alone most of the time 0 No I take three or more different prescribed or over-the-counter drugs a day 0 No Without wanting to, I have lost or gained 10 pounds in the last six months 0 No I am not always physically able to shop, cook and/or feed myself 0 No Nutrition Protocols Good Risk Protocol 0 No interventions needed Moderate Risk Protocol High Risk Proctocol Risk Level: Good Risk Score: 0 Electronic Signature(s) Signed: 05/19/2022 11:52:25 AM By: Rhae Hammock RN Entered By: Rhae Hammock on 05/19/2022 09:13:32

## 2022-05-19 NOTE — Progress Notes (Signed)
SHEBRA, MULDROW ([id number]) Visit Report for 05/19/2022 Allergy List Details Patient Name: Date of Service: [name] RD, [name] 05/19/2022 9:00 A M Medical Record Number: [id number] Patient Account Number: [id number] Date of Birth/Sex: Treating RN: February 13, 1961 (61 y.o. [name], [name] Primary Care Maryfer Tauzin: [name] Other Clinician: Referring Martine Bleecker: Treating Cristal Qadir/Extender: Burley Saver Weeks in Treatment: 0 Allergies Active Allergies No Known Allergies Allergy Notes Electronic Signature(s) Signed: 05/19/2022 11:52:25 AM By: Rhae Hammock RN Entered By: Rhae Hammock on 05/19/2022 09:10:07 -------------------------------------------------------------------------------- Arrival Information Details Patient Name: Date of Service: [name], [name] 05/19/2022 9:00 A M Medical Record Number: [id number] Patient Account Number: [id number] Date of Birth/Sex: Treating RN: 12/22/1960 (61 y.o. [name], [name] Primary Care Barett Whidbee: [name] Other Clinician: Referring Jago Carton: Treating Sir Mallis/Extender: [name] in Treatment: 0 Visit Information Patient Arrived: Ambulatory Arrival Time: 09:06 Accompanied By: self Transfer Assistance: None Patient Identification Verified: Yes Secondary Verification Process Completed: Yes Patient Requires Transmission-Based Precautions: No Patient Has Alerts: No History Since Last Visit Added or deleted any medications: No Any new allergies or adverse reactions: No Had a fall or experienced change in activities of daily living that may affect risk of falls: No Signs or symptoms of abuse/neglect since last visito No Hospitalized since last visit: No Implantable device outside of the clinic excluding cellular tissue based products placed in the center since last visit: No Electronic Signature(s) Signed: 05/19/2022 11:52:25 AM By: Rhae Hammock RN Entered By: Rhae Hammock on 05/19/2022 09:08:51 -------------------------------------------------------------------------------- Clinic Level of Care Assessment Details Patient Name: Date of Service: [name], [name] 05/19/2022 9:00 A M Medical Record Number: [id number] Patient Account Number: [id number] Date of Birth/Sex: Treating RN: [birth year] (61 y.o. [name] Primary Care Edrees Valent: [name] Other Clinician: Referring Terran Klinke: Treating Austin Pongratz/Extender: [name] in Treatment: 0 Clinic Level of Care Assessment Items TOOL 1 Quantity Score '[]'$  - 0 Use when EandM and Procedure is performed on INITIAL visit ASSESSMENTS - Nursing Assessment / Reassessment X- 1 20 General Physical Exam (combine w/ comprehensive assessment (listed just below) when performed on new pt. evals) X- 1 25 Comprehensive Assessment (HX, ROS, Risk Assessments, Wounds Hx, etc.) ASSESSMENTS - Wound and Skin Assessment / Reassessment '[]'$  - 0 Dermatologic / Skin Assessment (not related to wound area) ASSESSMENTS - Ostomy and/or Continence Assessment and Care '[]'$  - 0 Incontinence Assessment and Management '[]'$  - 0 Ostomy Care Assessment and Management (repouching, etc.) PROCESS - Coordination of Care X - Simple Patient / Family Education for ongoing care 1 15 '[]'$  - 0 Complex (extensive) Patient / Family Education for ongoing care X- 1 10 Staff obtains Programmer, systems, Records, T Results / Process Orders est '[]'$  - 0 Staff telephones HHA, Nursing Homes / Clarify orders / etc '[]'$  - 0 Routine Transfer to another Facility (non-emergent condition) '[]'$  - 0 Routine Hospital Admission (non-emergent condition) X- 1 15 New Admissions / Biomedical engineer / Ordering NPWT Apligraf, etc. , '[]'$  - 0 Emergency Hospital Admission (emergent condition) PROCESS - Special Needs '[]'$  - 0 Pediatric / Minor Patient Management '[]'$  - 0 Isolation Patient Management '[]'$  - 0 Hearing / Language / Visual special  needs '[]'$  - 0 Assessment of Community assistance (transportation, D/C planning, etc.) '[]'$  - 0 Additional assistance / Altered mentation '[]'$  - 0 Support Surface(s) Assessment (bed, cushion, seat, etc.) INTERVENTIONS - Miscellaneous '[]'$  - 0 External ear exam '[]'$  - 0 Patient Transfer (multiple staff / Civil Service fast streamer / Similar devices) '[]'$  -  0 Simple Staple / Suture removal (25 or less) '[]'$  - 0 Complex Staple / Suture removal (26 or more) '[]'$  - 0 Hypo/Hyperglycemic Management (do not check if billed separately) '[]'$  - 0 Ankle / Brachial Index (ABI) - do not check if billed separately Has the patient been seen at the hospital within the last three years: Yes Total Score: 85 Level Of Care: New/Established - Level 3 Electronic Signature(s) Signed: 05/19/2022 5:12:46 PM By: Baruch Gouty RN, BSN Entered By: Baruch Gouty on 05/19/2022 10:12:03 -------------------------------------------------------------------------------- Compression Therapy Details Patient Name: Date of Service: [name], [name] 05/19/2022 9:00 A M Medical Record Number: [id number] Patient Account Number: [id number] Date of Birth/Sex: Treating RN: 11-01-1960 (61 y.o. [name] Primary Care Raychelle Hudman: [name] Other Clinician: Referring Antawn Sison: Treating Thoren Hosang/Extender: Burley Saver Weeks in Treatment: 0 Compression Therapy Performed for Wound Assessment: Wound #21 Left,Lateral Lower Leg Performed By: Clinician Baruch Gouty, RN Compression Type: Four Layer Post Procedure Diagnosis Same as Pre-procedure Electronic Signature(s) Signed: 05/19/2022 5:12:46 PM By: Baruch Gouty RN, BSN Entered By: Baruch Gouty on 05/19/2022 09:43:28 -------------------------------------------------------------------------------- Encounter Discharge Information Details Patient Name: Date of Service: [name], [name] 05/19/2022 9:00 A M Medical Record Number: [id number] Patient Account  Number: [id number] Date of Birth/Sex: Treating RN: [birth year] (61 y.o. [name] Primary Care Damyen Knoll: [name] Other Clinician: Referring Narda Fundora: Treating Cammi Consalvo/Extender: [name] in Treatment: 0 Encounter Discharge Information Items Post Procedure Vitals Discharge Condition: Stable Temperature (F): 98.9 Ambulatory Status: Ambulatory Pulse (bpm): 74 Discharge Destination: Home Respiratory Rate (breaths/min): 18 Transportation: Private Auto Blood Pressure (mmHg): 185/101 Accompanied By: self Schedule Follow-up Appointment: Yes Clinical Summary of Care: Patient Declined Electronic Signature(s) Signed: 05/19/2022 5:12:46 PM By: Baruch Gouty RN, BSN Entered By: Baruch Gouty on 05/19/2022 10:14:36 -------------------------------------------------------------------------------- Lower Extremity Assessment Details Patient Name: Date of Service: [name], [name] 05/19/2022 9:00 A M Medical Record Number: [id number] Patient Account Number: [id number] Date of Birth/Sex: Treating RN: 08-Oct-1960 (61 y.o. [name], [name] Primary Care Rinda Rollyson: [name] Other Clinician: Referring Athanasia Stanwood: Treating Johnsie Moscoso/Extender: Burley Saver Weeks in Treatment: 0 Edema Assessment Assessed: [Left: Yes] [Right: No] Edema: [Left: Ye] [Right: s] Calf Left: Right: Point of Measurement: 40 cm From Medial Instep 45.5 cm Ankle Left: Right: Point of Measurement: 10 cm From Medial Instep 28 cm Knee To Floor Left: Right: From Medial Instep 48 cm Vascular Assessment Pulses: Dorsalis Pedis Palpable: [Left:Yes] Posterior Tibial Palpable: [Left:Yes] Electronic Signature(s) Signed: 05/19/2022 11:52:25 AM By: Rhae Hammock RN Entered By: Rhae Hammock on 05/19/2022 09:18:30 -------------------------------------------------------------------------------- Multi Wound Chart Details Patient Name: Date of  Service: [name], [name] 05/19/2022 9:00 A M Medical Record Number: [id number] Patient Account Number: [id number] Date of Birth/Sex: Treating RN: [birth year] (61 y.o. [name] Primary Care Raeqwon Lux: [name] Other Clinician: Referring Tex Conroy: Treating Achille Xiang/Extender: Burley Saver Weeks in Treatment: 0 Vital Signs Height(in): 70 Pulse(bpm): 51 Weight(lbs): 193 Blood Pressure(mmHg): 185/101 Body Mass Index(BMI): 29.3 Temperature(F): 98.9 Respiratory Rate(breaths/min): 17 Photos: [N/A:N/A] Left, Lateral Lower Leg Left, Distal, Lateral Lower Leg N/A Wound Location: Gradually Appeared Gradually Appeared N/A Wounding Event: Venous Leg Ulcer Venous Leg Ulcer N/A Primary Etiology: Angina, Congestive Heart Failure, Angina, Congestive Heart Failure, N/A Comorbid History: Hypertension, Peripheral Venous Hypertension, Peripheral Venous Disease Disease 04/28/2022 04/28/2022 N/A Date Acquired: 0 0 N/A Weeks of Treatment: Open Open N/A Wound Status: No No N/A Wound Recurrence: 8.5x3.5x0.1 1x2.5x0.1 N/A Measurements L x W x  D (cm) 23.366 1.963 N/A A (cm) : rea 2.337 0.196 N/A Volume (cm) : Full Thickness With Exposed Support Full Thickness With Exposed Support N/A Classification: Structures Structures Medium Medium N/A Exudate A mount: Serosanguineous Serosanguineous N/A Exudate Type: red, brown red, brown N/A Exudate Color: Distinct, outline attached Distinct, outline attached N/A Wound Margin: Medium (34-66%) Small (1-33%) N/A Granulation A mount: Red, Pink N/A N/A Granulation Quality: Medium (34-66%) Large (67-100%) N/A Necrotic A mount: Eschar, Adherent Slough Eschar, Adherent Slough N/A Necrotic Tissue: Fat Layer (Subcutaneous Tissue): Yes Fat Layer (Subcutaneous Tissue): Yes N/A Exposed Structures: Fascia: No Fascia: No Tendon: No Tendon: No Muscle: No Muscle: No Joint: No Joint: No Bone: No Bone: No None None  N/A Epithelialization: Debridement - Selective/Open Wound N/A N/A Debridement: Pre-procedure Verification/Time Out 09:35 N/A N/A Taken: Lidocaine 4% Topical Solution N/A N/A Pain Control: Necrotic/Eschar, Slough N/A N/A Tissue Debrided: Non-Viable Tissue N/A N/A Level: 24.5 N/A N/A Debridement A (sq cm): rea Curette N/A N/A Instrument: Minimum N/A N/A Bleeding: Pressure N/A N/A Hemostasis A chieved: 8 N/A N/A Procedural Pain: 5 N/A N/A Post Procedural Pain: Procedure was tolerated well N/A N/A Debridement Treatment Response: 8.5x3.5x0.1 N/A N/A Post Debridement Measurements L x W x D (cm) 2.337 N/A N/A Post Debridement Volume: (cm) Compression Therapy N/A N/A Procedures Performed: Debridement Treatment Notes Wound #21 (Lower Leg) Wound Laterality: Left, Lateral Cleanser Peri-Wound Care Sween Lotion (Moisturizing lotion) Discharge Instruction: Apply moisturizing lotion as directed Topical Gentamicin Discharge Instruction: As directed by physician Primary Dressing KerraCel Ag Gelling Fiber Dressing, 4x5 in (silver alginate) Discharge Instruction: Apply silver alginate to wound bed as instructed Santyl Ointment Discharge Instruction: Apply nickel thick amount to wound bed as instructed Secondary Dressing ABD Pad, 8x10 Discharge Instruction: Apply over primary dressing as directed. Woven Gauze Sponge, Non-Sterile 4x4 in Discharge Instruction: Apply over primary dressing as directed. Secured With Compression Wrap FourPress (4 layer compression wrap) Discharge Instruction: Apply four layer compression as directed. May also use Miliken CoFlex 2 layer compression system as alternative. Compression Stockings Add-Ons Wound #22 (Lower Leg) Wound Laterality: Left, Lateral, Distal Cleanser Peri-Wound Care Sween Lotion (Moisturizing lotion) Discharge Instruction: Apply moisturizing lotion as directed Topical Gentamicin Discharge Instruction: As directed by  physician Primary Dressing Santyl Ointment Discharge Instruction: Apply nickel thick amount to wound bed as instructed Secondary Dressing ABD Pad, 8x10 Discharge Instruction: Apply over primary dressing as directed. Woven Gauze Sponge, Non-Sterile 4x4 in Discharge Instruction: Apply over primary dressing as directed. Secured With Compression Wrap FourPress (4 layer compression wrap) Discharge Instruction: Apply four layer compression as directed. May also use Miliken CoFlex 2 layer compression system as alternative. Compression Stockings Add-Ons Electronic Signature(s) Signed: 05/19/2022 10:20:05 AM By: Fredirick Maudlin MD FACS Signed: 05/19/2022 5:12:46 PM By: Baruch Gouty RN, BSN Entered By: Fredirick Maudlin on 05/19/2022 10:20:05 -------------------------------------------------------------------------------- Multi-Disciplinary Care Plan Details Patient Name: Date of Service: [name], [name] 05/19/2022 9:00 A M Medical Record Number: [id number] Patient Account Number: [id number] Date of Birth/Sex: Treating RN: 05-28-1961 (61 y.o. [name] Primary Care Fritzi Scripter: [name] Other Clinician: Referring Caylor Tallarico: Treating Adrinne Sze/Extender: [name] in Treatment: 0 Multidisciplinary Care Plan reviewed with physician Active Inactive Soft Tissue Infection Nursing Diagnoses: Impaired tissue integrity Potential for infection: soft tissue Goals: Patient's soft tissue infection will resolve Date Initiated: 05/19/2022 Target Resolution Date: 06/16/2022 Goal Status: Active Signs and symptoms of infection will be recognized early to allow for prompt treatment Date Initiated: 05/19/2022 Target Resolution Date: 06/16/2022 Goal Status:  Active Interventions: Assess signs and symptoms of infection every visit Provide education on infection Treatment Activities: Culture and sensitivity : 05/19/2022 Systemic antibiotics : 05/19/2022 T  ordered outside of clinic : 05/19/2022 est Notes: Venous Leg Ulcer Nursing Diagnoses: Actual venous Insuffiency (use after diagnosis is confirmed) Knowledge deficit related to disease process and management Goals: Patient will maintain optimal edema control Date Initiated: 05/19/2022 Target Resolution Date: 06/16/2022 Goal Status: Active Patient/caregiver will verbalize understanding of disease process and disease management Date Initiated: 05/19/2022 Target Resolution Date: 06/16/2022 Goal Status: Active Interventions: Assess peripheral edema status every visit. Compression as ordered Provide education on venous insufficiency Treatment Activities: Therapeutic compression applied : 05/19/2022 Notes: Wound/Skin Impairment Nursing Diagnoses: Impaired tissue integrity Knowledge deficit related to ulceration/compromised skin integrity Goals: Patient/caregiver will verbalize understanding of skin care regimen Date Initiated: 05/19/2022 Target Resolution Date: 06/16/2022 Goal Status: Active Ulcer/skin breakdown will have a volume reduction of 30% by week 4 Date Initiated: 05/19/2022 Target Resolution Date: 05/19/2022 Goal Status: Active Interventions: Assess patient/caregiver ability to obtain necessary supplies Assess patient/caregiver ability to perform ulcer/skin care regimen upon admission and as needed Assess ulceration(s) every visit Provide education on ulcer and skin care Treatment Activities: Skin care regimen initiated : 05/19/2022 Topical wound management initiated : 05/19/2022 Notes: Electronic Signature(s) Signed: 05/19/2022 5:12:46 PM By: Baruch Gouty RN, BSN Entered By: Baruch Gouty on 05/19/2022 09:46:36 -------------------------------------------------------------------------------- Pain Assessment Details Patient Name: Date of Service: [name], [name] 05/19/2022 9:00 A M Medical Record Number: [id number] Patient Account Number: [id number] Date of  Birth/Sex: Treating RN: July 13, 1961 (61 y.o. [name], [name] Primary Care Solimar Maiden: [name] Other Clinician: Referring Patsie Mccardle: Treating Kesean Serviss/Extender: Burley Saver Weeks in Treatment: 0 Active Problems Location of Pain Severity and Description of Pain Patient Has Paino Yes Site Locations Pain Location: Generalized Pain, Pain in Ulcers With Dressing Change: Yes Duration of the Pain. Constant / Intermittento Constant Rate the pain. Current Pain Level: 10 Worst Pain Level: 10 Least Pain Level: 0 Tolerable Pain Level: 10 Character of Pain Describe the Pain: Aching Pain Management and Medication Current Pain Management: Medication: Yes Cold Application: No Rest: No Massage: No Activity: No T.E.N.S.: No Heat Application: No Leg drop or elevation: No Is the Current Pain Management Adequate: Adequate How does your wound impact your activities of daily livingo Sleep: No Bathing: No Appetite: No Relationship With Others: No Bladder Continence: No Emotions: No Bowel Continence: No Work: No Toileting: No Drive: No Dressing: No Hobbies: No Electronic Signature(s) Signed: 05/19/2022 11:52:25 AM By: Rhae Hammock RN Entered By: Rhae Hammock on 05/19/2022 09:16:20 -------------------------------------------------------------------------------- Patient/Caregiver Education Details Patient Name: Date of Service: [name], [name] 9/29/2023andnbsp9:00 A M Medical Record Number: [id number] Patient Account Number: [id number] Date of Birth/Gender: Treating RN: 01/14/1961 (61 y.o. [name] Primary Care Physician: [name] Other Clinician: Referring Physician: Treating Physician/Extender: [name] in Treatment: 0 Education Assessment Education Provided To: Patient Education Topics Provided Infection: Methods: Explain/Verbal Responses: Reinforcements needed, State content  correctly Venous: Methods: Explain/Verbal Responses: Reinforcements needed, State content correctly Wound/Skin Impairment: Methods: Explain/Verbal Responses: Reinforcements needed, State content correctly Electronic Signature(s) Signed: 05/19/2022 5:12:46 PM By: Baruch Gouty RN, BSN Entered By: Baruch Gouty on 05/19/2022 09:47:05 -------------------------------------------------------------------------------- Wound Assessment Details Patient Name: Date of Service: [name], [name] 05/19/2022 9:00 A M Medical Record Number: [id number] Patient Account Number: [id number] Date of Birth/Sex: Treating RN: [birth year] (61 y.o. [name], [name] Primary Care Aleisa Howk: [name] Other Clinician: Referring  Beverly Ferner: Treating Jatoria Kneeland/Extender: Burley Saver Weeks in Treatment: 0 Wound Status Wound Number: 21 Primary Venous Leg Ulcer Etiology: Wound Location: Left, Lateral Lower Leg Wound Status: Open Wounding Event: Gradually Appeared Comorbid Angina, Congestive Heart Failure, Hypertension, Peripheral Date Acquired: 04/28/2022 History: Venous Disease Weeks Of Treatment: 0 Clustered Wound: No Photos Wound Measurements Length: (cm) 8.5 Width: (cm) 3.5 Depth: (cm) 0.1 Area: (cm) 23.366 Volume: (cm) 2.337 % Reduction in Area: % Reduction in Volume: Epithelialization: None Tunneling: No Undermining: No Wound Description Classification: Full Thickness With Exposed Support Structures Wound Margin: Distinct, outline attached Exudate Amount: Medium Exudate Type: Serosanguineous Exudate Color: red, brown Foul Odor After Cleansing: No Slough/Fibrino Yes Wound Bed Granulation Amount: Medium (34-66%) Exposed Structure Granulation Quality: Red, Pink Fascia Exposed: No Necrotic Amount: Medium (34-66%) Fat Layer (Subcutaneous Tissue) Exposed: Yes Necrotic Quality: Eschar, Adherent Slough Tendon Exposed: No Muscle Exposed: No Joint Exposed: No Bone  Exposed: No Treatment Notes Wound #21 (Lower Leg) Wound Laterality: Left, Lateral Cleanser Peri-Wound Care Sween Lotion (Moisturizing lotion) Discharge Instruction: Apply moisturizing lotion as directed Topical Gentamicin Discharge Instruction: As directed by physician Primary Dressing KerraCel Ag Gelling Fiber Dressing, 4x5 in (silver alginate) Discharge Instruction: Apply silver alginate to wound bed as instructed Santyl Ointment Discharge Instruction: Apply nickel thick amount to wound bed as instructed Secondary Dressing ABD Pad, 8x10 Discharge Instruction: Apply over primary dressing as directed. Woven Gauze Sponge, Non-Sterile 4x4 in Discharge Instruction: Apply over primary dressing as directed. Secured With Compression Wrap FourPress (4 layer compression wrap) Discharge Instruction: Apply four layer compression as directed. May also use Miliken CoFlex 2 layer compression system as alternative. Compression Stockings Add-Ons Electronic Signature(s) Signed: 05/19/2022 11:52:25 AM By: Rhae Hammock RN Signed: 05/19/2022 5:12:46 PM By: Baruch Gouty RN, BSN Entered By: Baruch Gouty on 05/19/2022 09:24:09 -------------------------------------------------------------------------------- Wound Assessment Details Patient Name: Date of Service: [name], [name] 05/19/2022 9:00 A M Medical Record Number: [id number] Patient Account Number: [id number] Date of Birth/Sex: Treating RN: 11-Jun-1961 (61 y.o. [name], [name] Primary Care Maurya Nethery: [name] Other Clinician: Referring Jester Klingberg: Treating Debria Broecker/Extender: Burley Saver Weeks in Treatment: 0 Wound Status Wound Number: 22 Primary Venous Leg Ulcer Etiology: Wound Location: Left, Distal, Lateral Lower Leg Wound Status: Open Wounding Event: Gradually Appeared Comorbid Angina, Congestive Heart Failure, Hypertension, Peripheral Date Acquired: 04/28/2022 History: Venous Disease Weeks Of  Treatment: 0 Clustered Wound: No Photos Wound Measurements Length: (cm) 1 Width: (cm) 2.5 Depth: (cm) 0.1 Area: (cm) 1.963 Volume: (cm) 0.196 % Reduction in Area: % Reduction in Volume: Epithelialization: None Tunneling: No Undermining: No Wound Description Classification: Full Thickness With Exposed Support Structures Wound Margin: Distinct, outline attached Exudate Amount: Medium Exudate Type: Serosanguineous Exudate Color: red, brown Foul Odor After Cleansing: No Slough/Fibrino Yes Wound Bed Granulation Amount: Small (1-33%) Exposed Structure Necrotic Amount: Large (67-100%) Fascia Exposed: No Necrotic Quality: Eschar, Adherent Slough Fat Layer (Subcutaneous Tissue) Exposed: Yes Tendon Exposed: No Muscle Exposed: No Joint Exposed: No Bone Exposed: No Treatment Notes Wound #22 (Lower Leg) Wound Laterality: Left, Lateral, Distal Cleanser Peri-Wound Care Sween Lotion (Moisturizing lotion) Discharge Instruction: Apply moisturizing lotion as directed Topical Gentamicin Discharge Instruction: As directed by physician Primary Dressing Santyl Ointment Discharge Instruction: Apply nickel thick amount to wound bed as instructed Secondary Dressing ABD Pad, 8x10 Discharge Instruction: Apply over primary dressing as directed. Woven Gauze Sponge, Non-Sterile 4x4 in Discharge Instruction: Apply over primary dressing as directed. Secured With Compression Wrap FourPress (4 layer compression wrap) Discharge Instruction: Apply four  layer compression as directed. May also use Miliken CoFlex 2 layer compression system as alternative. Compression Stockings Add-Ons Electronic Signature(s) Signed: 05/19/2022 11:52:25 AM By: Rhae Hammock RN Signed: 05/19/2022 5:12:46 PM By: Baruch Gouty RN, BSN Entered By: Baruch Gouty on 05/19/2022 09:24:38 -------------------------------------------------------------------------------- Vitals Details Patient Name: Date of  Service: [name], [name] 05/19/2022 9:00 A M Medical Record Number: [id number] Patient Account Number: [id number] Date of Birth/Sex: Treating RN: August 22, 1960 (61 y.o. [name], [name] Primary Care Tyshae Stair: [name] Other Clinician: Referring Cashus Halterman: Treating Mala Gibbard/Extender: Burley Saver Weeks in Treatment: 0 Vital Signs Time Taken: 09:08 Temperature (F): 98.9 Height (in): 68 Pulse (bpm): 74 Source: Stated Respiratory Rate (breaths/min): 17 Weight (lbs): 193 Blood Pressure (mmHg): 185/101 Source: Stated Reference Range: 80 - 120 mg / dl Body Mass Index (BMI): 29.3 Electronic Signature(s) Signed: 05/19/2022 5:12:46 PM By: Baruch Gouty RN, BSN Entered By: Baruch Gouty on 05/19/2022 10:14:53

## 2022-05-19 NOTE — Progress Notes (Signed)
[name], [name] ([id number]) Visit Report for 05/19/2022 Chief Complaint Document Details Patient Name: Date of Service: [name] Chavez, [place] RO LYN 05/19/2022 9:00 A M Medical Record Number: [id number] Patient Account Number: [id number] Date of Birth/Sex: Treating RN: 1961-01-18 (61 y.o. [name] Primary Care Provider: Minette Chavez Other Clinician: Referring Provider: Treating Provider/Extender: [name] in Treatment: 0 Information Obtained from: Patient Chief Complaint Patient presents for treatment of an open ulcer due to venous insufficiency. she has had a recurrent ulceration of the left lower extremity for about 3 weeks 05/19/2022: The patient returns after a 5-year absence with a recurrent ulceration on the left lower extremity at the same site as previous wounds Electronic Signature(s) Signed: 05/19/2022 10:21:15 AM By: Summer Maudlin MD FACS Entered By: [name] on 05/19/2022 10:21:14 -------------------------------------------------------------------------------- Debridement Details Patient Name: Date of Service: [name], [name] 05/19/2022 9:00 A M Medical Record Number: [id number] Patient Account Number: [id number] Date of Birth/Sex: Treating RN: 1961-05-17 (61 y.o. [name], [name] Primary Care Provider: Minette Chavez Other Clinician: Referring Provider: Treating Provider/Extender: [name] Weeks in Treatment: 0 Debridement Performed for Assessment: Wound #21 Left,Lateral Lower Leg Performed By: Physician Summer Maudlin, MD Debridement Type: Debridement Severity of Tissue Pre Debridement: Fat layer exposed Level of Consciousness (Pre-procedure): Awake and Alert Pre-procedure Verification/Time Out Yes - 09:35 Taken: Start Time: 09:36 Pain Control: Lidocaine 4% T opical Solution T Area Debrided (L x W): otal 7 (cm) x 3.5 (cm) = 24.5 (cm) Tissue and other material debrided: Non-Viable, Eschar,  Slough, Slough Level: Non-Viable Tissue Debridement Description: Selective/Open Wound Instrument: Curette Bleeding: Minimum Hemostasis Achieved: Pressure Procedural Pain: 8 Post Procedural Pain: 5 Response to Treatment: Procedure was tolerated well Level of Consciousness (Post- Awake and Alert procedure): Post Debridement Measurements of Total Wound Length: (cm) 8.5 Width: (cm) 3.5 Depth: (cm) 0.1 Volume: (cm) 2.337 Character of Wound/Ulcer Post Debridement: Requires Further Debridement Severity of Tissue Post Debridement: Fat layer exposed Post Procedure Diagnosis Same as Pre-procedure Notes scribed by Summer Gouty, RN for Dr. Celine Chavez Electronic Signature(s) Signed: 05/19/2022 12:16:21 PM By: Summer Maudlin MD FACS Signed: 05/19/2022 5:12:46 PM By: Summer Gouty RN, BSN Entered By: [name] on 05/19/2022 11:45:27 -------------------------------------------------------------------------------- HPI Details Patient Name: Date of Service: [name], [name] 05/19/2022 9:00 A M Medical Record Number: [id number] Patient Account Number: [id number] Date of Birth/Sex: Treating RN: 11-Mar-1961 (61 y.o. [name] Primary Care Provider: Minette Chavez Other Clinician: Referring Provider: Treating Provider/Extender: [name] Weeks in Treatment: 0 History of Present Illness Location: Ulcer on the left lower extremity Quality: Patient reports experiencing a sharp pain to affected area(s). Severity: Presents with a pain of ____8_on a pain scale of 1-10. Duration: Patient has had the wound for < 2 weeks prior to presenting for treatment Timing: Pain in wound is constant (hurts all the time) Context: The wound would happen gradually Modifying Factors: Patient wound(s)/ulcer(s) are improving due PN:[id number] her compression stockings regularly ssociated Signs and Symptoms: Patient reports presence of swelling A HPI Description: 61 year old patient  who is known to have significant hypertension, venous stasis ulcer, diastolic heart failure, hypokalemia, CHF, chronic bronchitis and nicotine addiction has been seen in the wound center previously and now comes back with a left lower extremity ulceration. She says she's been wearing her stockings regularly but I believe she's been noncompliant from the description of when she wears it and how often she wears it. She also sees her PCP on a regular  basis but I believe her blood pressure has been running very high and her diastolic has been as high as 130. Her systolic normally runs in the low 200s. She has had her last venous study done in 2014 and this was a DVT study. She has not had any endovenous ablation. 06/09/2015 -- her arterial studies revealed that her right ABI was 1.14 and the left ABI was 1.18. Her venous duplex study revealed there was no evidence of DVT or SVT There was deep vein reflux in the left common femoral vein. There was also reflux . noted in the left saphenofemoral junction and proximal to the mid great saphenous vein. In view of this I am going to recommend a vascular surgery opinion to see what procedure would best serve her. 06/16/2015 -- her vascular opinion is on November 8 until then we will continue with local care. 06/23/2015 -- she continues to smoke about 4 cigarettes a day and other than that has been very compliant with her management. 06/30/2015 -- she saw Dr. Kellie Chavez recently who recommended: [name] is a 61 y.o. female who presents with: bilateral lower extremity chronic venous insufficiency (C6). The patient has a history of bilateral lower extremity venous stasis ulceration. Her current left leg wound is healing with unna boot application. All of her previous venous stasis ulcers have healed within 4 weeks. Her great saphenous veins bilaterally on sonosite exam were not markedly dilated. Since her current left leg wound is healing, will have her  follow up in 3 months for further evaluation. She has adequate arterial circulation. She will continue to go to the wound center for unna boots. Recommended use of compression stocks for swelling management once her left leg wound hass healed. Advised her to also wear compression on her right leg. 07/14/2015 -- she does not have her stockings with her today but she is going right from here to the store to buy this compression stockings. He still continues to smoke couple of cigarettes a day 10/07/15; the patient returns today with a recurrent inflamed ulceration on the lateral left leg. We know her from previous recurrent ulcerations on her legs largely related to venous insufficiency. Previous arterial evaluation in October 2016 showed ABI of 1.14 on the right 1.18 on the left with triphasic waves in both the posterior tibial and dorsalis pedis. She does not have a significant arterial issue. She has been followed by [facility] She has deep vein reflux noted in the left common femoral vein and reflux noted in the left saphenofemoral junction and proximal to mid great saphenous vein although his last note seems to suggest that no interventions at this time. In the meantime she is back here with a significant inflamed macerated area on her left lateral leg which is open already but certainly is a threatening larger ulceration. 10/14/15; the area looks a lot better than last week. I was concerned about a large impending ulceration or multiple small ulcerations however with edema control, and perhaps the doxycycline things actually look a lot better. Probably mostly edema control. We have been using silver alginate and Profore 10/21/15; only one dime-sized open area remains. This required surgical debridement. There is no evidence rounding infection she is completing her doxycycline. Edema control appears adequate. Still severe surrounding venous stasis and inflammation 10/28/15; wound  is continuing to contractor. No debridement was done. There is no evidence of surrounding infection. She has chronic venous insufficiency with inflammation however her edema is  well controlled no other issues were seen 11/05/15 wound continues to contractor. Surgical debridement done of nonviable's edges surface slough. This continues to look as though it is contracting 11/11/15 wound continues to contract and epithelialize. No debridement was necessary. This looks as though it is progressing towards closure next week 11/18/15 wound is healed. READMISSION 06/08/16; patient we know previously who has chronic venous insufficiency with inflammation and recurrent lower extremity ulceration. She has been in this clinic at least 2 prior occasions most recently earlier this year with a wound on her lateral left leg. We discharge during graded pressure stockings. She is not a diabetic. She has not have known PAD. She states is slightly over a month ago she developed increasing swelling in the right leg and an open wound on the lateral aspect of her right calf which is been getting worse over the last 3 weeks she's trying to Dr. this herself with topical antibiotics this is not work she is back in our clinic for review of that 06/15/16; still a nonviable surface that requires debridement still using Santyl 06/22/16; the patient has a new wound on her right lateral foot of uncertain etiology there is some tenderness around this area 06/29/16; the area on the right lateral leg appears to be better. His area on the right lateral foot is no better 07/06/16; she has a healthy wound on the right lateral leg however this is not appear to be smaller than last week 3.1 x 2.7 x 0.2. Her area on the right lateral foot really looks about the same 07/20/16; some improvement are noted in both wounds on the right lateral calf also on the right lateral foot. Improvement in dimensions. 08/03/16; left her wrap on for 2 weeks.  However both wound areas look improved the right lateral calf in terms of dimensions and also the right lateral foot. Still debridement in the right lateral Calf 08/17/16; the area on the right lateral foot has healed. The area on the right lateral calf improved again she leaves her wrap on for 2 weeks am not sure why this is. 08/31/16; the area on the right lateral foot remains healed. The area on the right lateral calf again is a very tiny wound area that should be healed next week 0.6 x 0.5 x 0.1 09/21/16; patient returns after a three-week hiatus I think with the same dressing on. As predicted 3 weeks ago the wound is actually healed. She has 15 mm TED hose. READMISSION 05/19/2022 This is a now 61 year old woman with a past medical history of venous insufficiency, CKD stage III, hypertension secondary to hyperaldosteronism, and previous venous ulcers on her legs. 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 The wound is on her left lateral lower leg. There is a lot of crusted eschar and slough present. She says the wound is very tender. Electronic Signature(s) Signed: 05/19/2022 10:23:47 AM By: Summer Maudlin MD FACS Entered By: [name] on 05/19/2022 10:23:46 -------------------------------------------------------------------------------- Physical Exam Details Patient Name: Date of Service: [name], [name] 05/19/2022 9:00 A M Medical Record Number: [id number] Patient Account Number: [id number] Date of Birth/Sex: Treating RN: 1961/03/14 (61 y.o. [name] Primary Care Provider: Minette Chavez Other Clinician: Referring Provider: Treating Provider/Extender: [name] Weeks in Treatment: 0 Constitutional Hypertensive, asymptomatic. . . . No acute distress. Respiratory Normal work of breathing on room air.. Cardiovascular 2+ nonpitting edema to the bilateral  lower  extremities.. Notes 05/19/2022: The wound is on her left lateral lower leg. There is a lot of crusted eschar and slough present. She says the wound is very tender. Electronic Signature(s) Signed: 05/19/2022 10:24:29 AM By: Summer Maudlin MD FACS Entered By: [name] on 05/19/2022 10:24:29 -------------------------------------------------------------------------------- Physician Orders Details Patient Name: Date of Service: [name], [name] 05/19/2022 9:00 A M Medical Record Number: [id number] Patient Account Number: [id number] Date of Birth/Sex: Treating RN: [birth year] (61 y.o. [name], [name] Primary Care Provider: Other Clinician: Minette Chavez Referring Provider: Treating Provider/Extender: [name] in Treatment: 0 Verbal / Phone Orders: No Diagnosis Coding ICD-10 Coding Code Description [phone] Varicose veins of left lower extremity with other complications A21.30 Chronic kidney disease, stage 3 unspecified L97.822 Non-pressure chronic ulcer of other part of left lower leg with fat layer exposed Follow-up Appointments ppointment in 1 week. - Dr. Celine Chavez RM 1 Return A Anesthetic (In clinic) Topical Lidocaine 4% applied to wound bed Bathing/ Shower/ Hygiene May shower with protection but do not get wound dressing(s) wet. Edema Control - Lymphedema / SCD / Other Elevate legs to the level of the heart or above for 30 minutes daily and/or when sitting, a frequency of: Avoid standing for long periods of time. Patient to wear own compression stockings every day. - right leg daily Exercise regularly Wound Treatment Wound #21 - Lower Leg Wound Laterality: Left, Lateral Peri-Wound Care: Sween Lotion (Moisturizing lotion) 1 x Per Week/30 Days Discharge Instructions: Apply moisturizing lotion as directed Topical: Gentamicin 1 x Per Week/30 Days Discharge Instructions: As directed by physician Prim Dressing: KerraCel Ag Gelling Fiber  Dressing, 4x5 in (silver alginate) 1 x Per Week/30 Days ary Discharge Instructions: Apply silver alginate to wound bed as instructed Prim Dressing: Santyl Ointment 1 x Per Week/30 Days ary Discharge Instructions: Apply nickel thick amount to wound bed as instructed Secondary Dressing: ABD Pad, 8x10 1 x Per Week/30 Days Discharge Instructions: Apply over primary dressing as directed. Secondary Dressing: Woven Gauze Sponge, Non-Sterile 4x4 in 1 x Per Week/30 Days Discharge Instructions: Apply over primary dressing as directed. Compression Wrap: FourPress (4 layer compression wrap) 1 x Per Week/30 Days Discharge Instructions: Apply four layer compression as directed. May also use Miliken CoFlex 2 layer compression system as alternative. Wound #22 - Lower Leg Wound Laterality: Left, Lateral, Distal Peri-Wound Care: Sween Lotion (Moisturizing lotion) 1 x Per Week/30 Days Discharge Instructions: Apply moisturizing lotion as directed Topical: Gentamicin 1 x Per Week/30 Days Discharge Instructions: As directed by physician Prim Dressing: Santyl Ointment 1 x Per Week/30 Days ary Discharge Instructions: Apply nickel thick amount to wound bed as instructed Secondary Dressing: ABD Pad, 8x10 1 x Per Week/30 Days Discharge Instructions: Apply over primary dressing as directed. Secondary Dressing: Woven Gauze Sponge, Non-Sterile 4x4 in 1 x Per Week/30 Days Discharge Instructions: Apply over primary dressing as directed. Compression Wrap: FourPress (4 layer compression wrap) 1 x Per Week/30 Days Discharge Instructions: Apply four layer compression as directed. May also use Miliken CoFlex 2 layer compression system as alternative. Laboratory naerobe culture (MICRO) Bacteria identified in Unspecified specimen by A LOINC Code: 865-7 Convenience Name: Anaerobic culture Patient Medications llergies: No Known Allergies A Notifications Medication Indication Start End prior to debridement  05/19/2022 lidocaine DOSE topical 4 % cream - cream topical 05/19/2022 amoxicillin-pot clavulanate DOSE oral 875 mg-125 mg tablet - 1 tab p.o. twice daily x10 days Electronic Signature(s) Signed: 05/19/2022 10:27:01 AM By: Summer Maudlin MD FACS Entered  By: [name] on 05/19/2022 10:27:00 -------------------------------------------------------------------------------- Problem List Details Patient Name: Date of Service: [name], [name] 05/19/2022 9:00 A M Medical Record Number: [id number] Patient Account Number: [id number] Date of Birth/Sex: Treating RN: 12-15-1960 (61 y.o. [name] Primary Care Provider: Minette Chavez Other Clinician: Referring Provider: Treating Provider/Extender: [name] Weeks in Treatment: 0 Active Problems ICD-10 Encounter Code Description Active Date MDM Diagnosis I83.892 Varicose veins of left lower extremity with other complications 0/25/8527 No Yes N18.30 Chronic kidney disease, stage 3 unspecified 05/19/2022 No Yes L97.822 Non-pressure chronic ulcer of other part of left lower leg with fat layer exposed9/29/2023 No Yes Inactive Problems Resolved Problems Electronic Signature(s) Signed: 05/19/2022 10:19:54 AM By: Summer Maudlin MD FACS Entered By: [name] on 05/19/2022 10:19:54 -------------------------------------------------------------------------------- Progress Note Details Patient Name: Date of Service: [name], [name] 05/19/2022 9:00 A M Medical Record Number: [id number] Patient Account Number: [id number] Date of Birth/Sex: Treating RN: Mar 28, 1961 (61 y.o. [name] Primary Care Provider: Minette Chavez Other Clinician: Referring Provider: Treating Provider/Extender: [name] in Treatment: 0 Subjective Chief Complaint Information obtained from Patient Patient presents for treatment of an open ulcer due to venous insufficiency. she has had a  recurrent ulceration of the left lower extremity for about 3 weeks 05/19/2022: The patient returns after a 5-year absence with a recurrent ulceration on the left lower extremity at the same site as previous wounds History of Present Illness (HPI) The following HPI elements were documented for the patient's wound: Location: Ulcer on the left lower extremity Quality: Patient reports experiencing a sharp pain to affected area(s). Severity: Presents with a pain of ____8_on a pain scale of 1-10. Duration: Patient has had the wound for < 2 weeks prior to presenting for treatment Timing: Pain in wound is constant (hurts all the time) Context: The wound would happen gradually Modifying Factors: Patient wound(s)/ulcer(s) are improving due [id number] her compression stockings regularly Associated Signs and Symptoms: Patient reports presence of swelling 61 year old patient who is known to have significant hypertension, venous stasis ulcer, diastolic heart failure, hypokalemia, CHF, chronic bronchitis and nicotine addiction has been seen in the wound center previously and now comes back with a left lower extremity ulceration. She says she's been wearing her stockings regularly but I believe she's been noncompliant from the description of when she wears it and how often she wears it. She also sees her PCP on a regular basis but I believe her blood pressure has been running very high and her diastolic has been as high as 130. Her systolic normally runs in the low 200s. She has had her last venous study done in 2014 and this was a DVT study. She has not had any endovenous ablation. 06/09/2015 -- her arterial studies revealed that her right ABI was 1.14 and the left ABI was 1.18. Her venous duplex study revealed there was no evidence of DVT or SVT There was deep vein reflux in the left common femoral vein. There was also reflux . noted in the left saphenofemoral junction and proximal to the mid great saphenous  vein. In view of this I am going to recommend a vascular surgery opinion to see what procedure would best serve her. 06/16/2015 -- her vascular opinion is on November 8 until then we will continue with local care. 06/23/2015 -- she continues to smoke about 4 cigarettes a day and other than that has been very compliant with her management. 06/30/2015 -- she saw Dr.  Kellie Chavez recently who recommended: Sabra Sessler is a 61 y.o. female who presents with: bilateral lower extremity chronic venous insufficiency (C6).  The patient has a history of bilateral lower extremity venous stasis ulceration. Her current left leg wound is healing with unna boot application. All of her previous venous stasis ulcers have healed within 4 weeks.  Her great saphenous veins bilaterally on sonosite exam were not markedly dilated. Since her current left leg wound is healing, will have her follow up in 3 months for further evaluation.  She has adequate arterial circulation.  She will continue to go to the wound center for unna boots.  Recommended use of compression stocks for swelling management once her left leg wound hass healed. Advised her to also wear compression on her right leg. 07/14/2015 -- she does not have her stockings with her today but she is going right from here to the store to buy this compression stockings. He still continues to smoke couple of cigarettes a day 10/07/15; the patient returns today with a recurrent inflamed ulceration on the lateral left leg. We know her from previous recurrent ulcerations on her legs largely related to venous insufficiency. Previous arterial evaluation in October 2016 showed ABI of 1.14 on the right 1.18 on the left with triphasic waves in both the posterior tibial and dorsalis pedis. She does not have a significant arterial issue. She has been followed by [facility]. She has deep vein reflux noted in the left common femoral vein and reflux  noted in the left saphenofemoral junction and proximal to mid great saphenous vein although his last note seems to suggest that no interventions at this time. In the meantime she is back here with a significant inflamed macerated area on her left lateral leg which is open already but certainly is a threatening larger ulceration. 10/14/15; the area looks a lot better than last week. I was concerned about a large impending ulceration or multiple small ulcerations however with edema control, and perhaps the doxycycline things actually look a lot better. Probably mostly edema control. We have been using silver alginate and Profore 10/21/15; only one dime-sized open area remains. This required surgical debridement. There is no evidence rounding infection she is completing her doxycycline. Edema control appears adequate. Still severe surrounding venous stasis and inflammation 10/28/15; wound is continuing to contractor. No debridement was done. There is no evidence of surrounding infection. She has chronic venous insufficiency with inflammation however her edema is well controlled no other issues were seen 11/05/15 wound continues to contractor. Surgical debridement done of nonviable's edges surface slough. This continues to look as though it is contracting 11/11/15 wound continues to contract and epithelialize. No debridement was necessary. This looks as though it is progressing towards closure next week 11/18/15 wound is healed. READMISSION 06/08/16; patient we know previously who has chronic venous insufficiency with inflammation and recurrent lower extremity ulceration. She has been in this clinic at least 2 prior occasions most recently earlier this year with a wound on her lateral left leg. We discharge during graded pressure stockings. She is not a diabetic. She has not have known PAD. She states is slightly over a month ago she developed increasing swelling in the right leg and an open wound on the lateral  aspect of her right calf which is been getting worse over the last 3 weeks she's trying to Dr. this herself with topical antibiotics this is not work she is back in our clinic for review  of that 06/15/16; still a nonviable surface that requires debridement still using Santyl 06/22/16; the patient has a new wound on her right lateral foot of uncertain etiology there is some tenderness around this area 06/29/16; the area on the right lateral leg appears to be better. His area on the right lateral foot is no better 07/06/16; she has a healthy wound on the right lateral leg however this is not appear to be smaller than last week 3.1 x 2.7 x 0.2. Her area on the right lateral foot really looks about the same 07/20/16; some improvement are noted in both wounds on the right lateral calf also on the right lateral foot. Improvement in dimensions. 08/03/16; left her wrap on for 2 weeks. However both wound areas look improved the right lateral calf in terms of dimensions and also the right lateral foot. Still debridement in the right lateral Calf 08/17/16; the area on the right lateral foot has healed. The area on the right lateral calf improved again she leaves her wrap on for 2 weeks am not sure why this is. 08/31/16; the area on the right lateral foot remains healed. The area on the right lateral calf again is a very tiny wound area that should be healed next week 0.6 x 0.5 x 0.1 09/21/16; patient returns after a three-week hiatus I think with the same dressing on. As predicted 3 weeks ago the wound is actually healed. She has 15 mm TED hose. READMISSION 05/19/2022 This is a now 61 year old woman with a past medical history of venous insufficiency, CKD stage III, hypertension secondary to hyperaldosteronism, and previous venous ulcers on her legs. 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 The wound is on her left lateral lower leg. There is a  lot of crusted eschar and slough present. She says the wound is very tender. Patient History Information obtained from Patient. Allergies No Known Allergies Family History Cancer - Mother, Diabetes - Mother,Siblings, Heart Disease - Siblings, Hypertension - Mother,Father,Siblings, Kidney Disease - Siblings, No family history of Hereditary Spherocytosis, Lung Disease, Seizures, Stroke, Thyroid Problems, Tuberculosis. Social History Current every day smoker - 2 cigarettes daily, Marital Status - Single, Alcohol Use - Rarely - Beer, Drug Use - No History, Caffeine Use - Rarely - Tea. Medical History Eyes Denies history of Cataracts, Glaucoma, Optic Neuritis Ear/Nose/Mouth/Throat Denies history of Chronic sinus problems/congestion, Middle ear problems Hematologic/Lymphatic Denies history of Anemia, Hemophilia, Human Immunodeficiency Virus, Lymphedema, Sickle Cell Disease Respiratory Denies history of Aspiration, Asthma, Chronic Obstructive Pulmonary Disease (COPD), Pneumothorax, Sleep Apnea Cardiovascular Patient has history of Angina, Congestive Heart Failure, Hypertension, Peripheral Venous Disease Denies history of Arrhythmia, Coronary Artery Disease, Deep Vein Thrombosis, Hypotension, Myocardial Infarction, Peripheral Arterial Disease, Phlebitis, Vasculitis Gastrointestinal Denies history of Cirrhosis , Colitis, Crohnoos, Hepatitis A, Hepatitis B, Hepatitis C Endocrine Denies history of Type I Diabetes, Type II Diabetes Genitourinary Denies history of End Stage Renal Disease Immunological Denies history of Lupus Erythematosus, Raynaudoos, Scleroderma Integumentary (Skin) Denies history of History of Burn Musculoskeletal Denies history of Gout, Rheumatoid Arthritis, Osteoarthritis, Osteomyelitis Neurologic Denies history of Dementia, Neuropathy, Quadriplegia, Paraplegia, Seizure Disorder Oncologic Denies history of Received Chemotherapy, Received Radiation Psychiatric Denies  history of Anorexia/bulimia, Confinement Anxiety Medical A Surgical History Notes nd Genitourinary Kidney disease-stable; sees nephrologist Review of Systems (ROS) Integumentary (Skin) Complains or has symptoms of Wounds. Objective Constitutional Hypertensive, asymptomatic. No acute distress. Vitals Time Taken: 9:08 AM, Height: 68  in, Source: Stated, Weight: 193 lbs, Source: Stated, BMI: 29.3, Temperature: 98.9 F, Pulse: 74 bpm, Respiratory Rate: 17 breaths/min, Blood Pressure: 185/101 mmHg. Respiratory Normal work of breathing on room air.. Cardiovascular 2+ nonpitting edema to the bilateral lower extremities.. General Notes: 05/19/2022: The wound is on her left lateral lower leg. There is a lot of crusted eschar and slough present. She says the wound is very tender. Integumentary (Hair, Skin) Wound #21 status is Open. Original cause of wound was Gradually Appeared. The date acquired was: 04/28/2022. The wound is located on the Left,Lateral Lower Leg. The wound measures 8.5cm length x 3.5cm width x 0.1cm depth; 23.366cm^2 area and 2.337cm^3 volume. There is Fat Layer (Subcutaneous Tissue) exposed. There is no tunneling or undermining noted. There is a medium amount of serosanguineous drainage noted. The wound margin is distinct with the outline attached to the wound base. There is medium (34-66%) red, pink granulation within the wound bed. There is a medium (34-66%) amount of necrotic tissue within the wound bed including Eschar and Adherent Slough. Wound #22 status is Open. Original cause of wound was Gradually Appeared. The date acquired was: 04/28/2022. The wound is located on the Left,Distal,Lateral Lower Leg. The wound measures 1cm length x 2.5cm width x 0.1cm depth; 1.963cm^2 area and 0.196cm^3 volume. There is Fat Layer (Subcutaneous Tissue) exposed. There is no tunneling or undermining noted. There is a medium amount of serosanguineous drainage noted. The wound margin is distinct with  the outline attached to the wound base. There is small (1-33%) granulation within the wound bed. There is a large (67-100%) amount of necrotic tissue within the wound bed including Eschar and Adherent Slough. 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 There was a Selective/Open Wound Non-Viable Tissue Debridement with a total area of 24.5 sq cm performed by Summer Maudlin, MD. With the following instrument(s): Curette to remove Non-Viable tissue/material. Material removed includes Eschar and Slough and after achieving pain control using Lidocaine 4% Topical Solution. No specimens were taken. A time out was conducted at 09:35, prior to the start of the procedure. A Minimum amount of bleeding was controlled with Pressure. The procedure was tolerated well with a pain level of 8 throughout and a pain level of 5 following the procedure. Post Debridement Measurements: 8.5cm length x 3.5cm width x 0.1cm depth; 2.337cm^3 volume. Character of Wound/Ulcer Post Debridement requires further debridement. Severity of Tissue Post Debridement is: Fat layer exposed. Post procedure Diagnosis Wound #21: Same as Pre-Procedure General Notes: scribed by Summer Gouty, RN for Dr. Celine Chavez. Pre-procedure diagnosis of Wound #21 is a Venous Leg Ulcer located on the Left,Lateral Lower Leg . There was a Four Layer Compression Therapy Procedure by Summer Gouty, RN. Post procedure Diagnosis Wound #21: Same as Pre-Procedure Plan Follow-up Appointments: Return Appointment in 1 week. - Dr. Celine Chavez RM 1 Anesthetic: (In clinic) Topical Lidocaine 4% applied to wound  bed Bathing/ Shower/ Hygiene: May shower with protection but do not get wound dressing(s) wet. Edema Control - Lymphedema / SCD / Other: Elevate legs to the level of the heart or above for 30 minutes daily and/or when sitting, a frequency of: Avoid standing for long periods of time. Patient  to wear own compression stockings every day. - right leg daily Exercise regularly Laboratory ordered were: Anaerobic culture The following medication(s) was prescribed: lidocaine topical 4 % cream cream topical for prior to debridement was prescribed at facility amoxicillin-pot clavulanate oral 875 mg-125 mg tablet 1 tab p.o. twice daily x10 days starting 05/19/2022 WOUND #21: - Lower Leg Wound Laterality: Left, Lateral Peri-Wound Care: Sween Lotion (Moisturizing lotion) 1 x Per Week/30 Days Discharge Instructions: Apply moisturizing lotion as directed Topical: Gentamicin 1 x Per Week/30 Days Discharge Instructions: As directed by physician Prim Dressing: KerraCel Ag Gelling Fiber Dressing, 4x5 in (silver alginate) 1 x Per Week/30 Days ary Discharge Instructions: Apply silver alginate to wound bed as instructed Prim Dressing: Santyl Ointment 1 x Per Week/30 Days ary Discharge Instructions: Apply nickel thick amount to wound bed as instructed Secondary Dressing: ABD Pad, 8x10 1 x Per Week/30 Days Discharge Instructions: Apply over primary dressing as directed. Secondary Dressing: Woven Gauze Sponge, Non-Sterile 4x4 in 1 x Per Week/30 Days Discharge Instructions: Apply over primary dressing as directed. Com pression Wrap: FourPress (4 layer compression wrap) 1 x Per Week/30 Days Discharge Instructions: Apply four layer compression as directed. May also use Miliken CoFlex 2 layer compression system as alternative. WOUND #22: - Lower Leg Wound Laterality: Left, Lateral, Distal Peri-Wound Care: Sween Lotion (Moisturizing lotion) 1 x Per Week/30 Days Discharge Instructions: Apply moisturizing lotion as  directed Topical: Gentamicin 1 x Per Week/30 Days Discharge Instructions: As directed by physician Prim Dressing: Santyl Ointment 1 x Per Week/30 Days ary Discharge Instructions: Apply nickel thick amount to wound bed as instructed Secondary Dressing: ABD Pad, 8x10 1 x Per Week/30 Days Discharge Instructions: Apply over primary dressing as directed. Secondary Dressing: Woven Gauze Sponge, Non-Sterile 4x4 in 1 x Per Week/30 Days Discharge Instructions: Apply over primary dressing as directed. Compression Wrap: FourPress (4 layer compression wrap) 1 x Per Week/30 Days Discharge Instructions: Apply four layer compression as directed. May also use Miliken CoFlex 2 layer compression system as alternative. 05/19/2022: This is a 62 year old woman who has previously been seen in our clinic, last in 2017, who has a recurrent venous leg ulcer. The wound is on her left lateral lower leg. There is a lot of crusted eschar and slough present. She says the wound is very tender. I used a curette to debride slough and eschar within the limits of patient tolerance. There is still a lot of nonviable debris present. I am concerned that the significant amount of pain she is experiencing may be secondary to infection so I did take a culture. I have empirically prescribed Augmentin and we will use a mixture of gentamicin and Santyl to her wounds under the 4-layer compression. Once her culture data return, I will make appropriate adjustments in her therapy. Follow-up in 1 week. Electronic Signature(s) Signed: 05/19/2022 12:16:21 PM By: Summer Maudlin MD FACS Signed: 05/19/2022 5:12:46 PM By: Summer Gouty RN, BSN Previous Signature: 05/19/2022 10:28:49 AM Version By: Summer Maudlin MD FACS Entered By: [name] on 05/19/2022 11:45:46 -------------------------------------------------------------------------------- HxROS Details Patient Name: Date of Service: [name], [name] 05/19/2022 9:00 A M Medical  Record Number: [id number] Patient Account Number: [id number] Date of Birth/Sex: Treating RN: 02/05/1961 (61 y.o. [name], [name] Primary Care Provider: Minette Chavez Other Clinician: Referring Provider: Treating Provider/Extender: [name] in Treatment: 0 Information Obtained From Patient Integumentary (Skin) Complaints and Symptoms: Positive for: Wounds Medical History: Negative for: History of Burn Eyes Medical History: Negative for:  Cataracts; Glaucoma; Optic Neuritis Ear/Nose/Mouth/Throat Medical History: Negative for: Chronic sinus problems/congestion; Middle ear problems Hematologic/Lymphatic Medical History: Negative for: Anemia; Hemophilia; Human Immunodeficiency Virus; Lymphedema; Sickle Cell Disease Respiratory Medical History: Negative for: Aspiration; Asthma; Chronic Obstructive Pulmonary Disease (COPD); Pneumothorax; Sleep Apnea Cardiovascular Medical History: Positive for: Angina; Congestive Heart Failure; Hypertension; Peripheral Venous Disease Negative for: Arrhythmia; Coronary Artery Disease; Deep Vein Thrombosis; Hypotension; Myocardial Infarction; Peripheral Arterial Disease; Phlebitis; Vasculitis Gastrointestinal Medical History: Negative for: Cirrhosis ; Colitis; Crohns; Hepatitis A; Hepatitis B; Hepatitis C Endocrine Medical History: Negative for: Type I Diabetes; Type II Diabetes Genitourinary Medical History: Negative for: End Stage Renal Disease Past Medical History Notes: Kidney disease-stable; sees nephrologist Immunological Medical History: Negative for: Lupus Erythematosus; Raynauds; Scleroderma Musculoskeletal Medical History: Negative for: Gout; Rheumatoid Arthritis; Osteoarthritis; Osteomyelitis Neurologic Medical History: Negative for: Dementia; Neuropathy; Quadriplegia; Paraplegia; Seizure Disorder Oncologic Medical History: Negative for: Received Chemotherapy; Received  Radiation Psychiatric Medical History: Negative for: Anorexia/bulimia; Confinement Anxiety Immunizations Implantable Devices No devices added Family and Social History Cancer: Yes - Mother; Diabetes: Yes - Mother,Siblings; Heart Disease: Yes - Siblings; Hereditary Spherocytosis: No; Hypertension: Yes - Mother,Father,Siblings; Kidney Disease: Yes - Siblings; Lung Disease: No; Seizures: No; Stroke: No; Thyroid Problems: No; Tuberculosis: No; Current every day smoker - 2 cigarettes daily; Marital Status - Single; Alcohol Use: Rarely - Beer; Drug Use: No History; Caffeine Use: Rarely - T Financial Concerns: ea; Yes; Food, Clothing or Shelter Needs: No; Support System Lacking: No; Transportation Concerns: No Electronic Signature(s) Signed: 05/19/2022 11:52:25 AM By: Rhae Hammock RN Signed: 05/19/2022 12:16:21 PM By: Summer Maudlin MD FACS Entered By: Rhae Hammock on 05/19/2022 09:11:24 -------------------------------------------------------------------------------- SuperBill Details Patient Name: Date of Service: [name], [name] 05/19/2022 Medical Record Number: [id number] Patient Account Number: [id number] Date of Birth/Sex: Treating RN: [birth year] (61 y.o. 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 G18.29 Chronic kidney disease, stage 3 unspecified L97.822 Non-pressure chronic ulcer of other part of left lower leg with fat layer exposed Facility Procedures CPT4 Code: 93716967 Description: [facility] VISIT-LEV 3 EST PT Modifier: 25 Quantity: 1 CPT4 Code: 89381017 Description: 51025 - DEBRIDE WOUND 1ST 20 SQ CM OR < ICD-10 Diagnosis Description L97.822 Non-pressure chronic ulcer of other part of left lower leg  with fat layer exposed Modifier: Quantity: 1 CPT4 Code: [id number] Description: 23536 - DEBRIDE WOUND EA ADDL 20 SQ CM ICD-10 Diagnosis Description L97.822 Non-pressure chronic ulcer of other part of left lower leg with fat layer exposed Modifier: Quantity: 1 Physician Procedures : CPT4 Code Description Modifier [id number] - WC PHYS LEVEL 4 - NEW PT 25 ICD-10 Diagnosis Description L97.822 Non-pressure chronic ulcer of other part of left lower leg with fat layer exposed I83.892 Varicose veins of left lower extremity with other  complications Y19.50 Chronic kidney disease, stage 3 unspecified Quantity: 1 : [id number] 45809 - WC PHYS DEBR WO ANESTH 20 SQ CM ICD-10 Diagnosis Description L97.822 Non-pressure chronic ulcer of other part of left lower leg with fat layer exposed Quantity: 1 : [id number] 05397 - WC PHYS DEBR WO ANESTH EA ADD 20 CM ICD-10 Diagnosis Description L97.822 Non-pressure chronic ulcer of other part of left lower leg with fat layer exposed Quantity: 1 Electronic Signature(s) Signed: 05/19/2022 12:16:21 PM By: Summer Maudlin MD FACS Signed: 05/19/2022 5:12:46 PM By: Summer Gouty RN, BSN  Previous Signature: 05/19/2022 10:29:56 AM Version By: Summer Maudlin MD FACS Entered By: [name] on 05/19/2022 11:44:44

## 2022-05-29 ENCOUNTER — Encounter (HOSPITAL_BASED_OUTPATIENT_CLINIC_OR_DEPARTMENT_OTHER): Payer: Medicare Other | Attending: General Surgery | Admitting: General Surgery

## 2022-05-29 DIAGNOSIS — Z09 Encounter for follow-up examination after completed treatment for conditions other than malignant neoplasm: Secondary | ICD-10-CM | POA: Diagnosis not present

## 2022-05-29 DIAGNOSIS — I872 Venous insufficiency (chronic) (peripheral): Secondary | ICD-10-CM | POA: Diagnosis not present

## 2022-05-29 DIAGNOSIS — I83028 Varicose veins of left lower extremity with ulcer other part of lower leg: Secondary | ICD-10-CM | POA: Insufficient documentation

## 2022-05-29 DIAGNOSIS — L97822 Non-pressure chronic ulcer of other part of left lower leg with fat layer exposed: Secondary | ICD-10-CM | POA: Diagnosis not present

## 2022-05-29 NOTE — Progress Notes (Signed)
ANICE, WILSHIRE ([id number]) [id number] Page 1 of 9 Visit Report for 05/29/2022 Arrival Information Details Patient Name: Date of Service: [name] RD, [place] RO LYN 05/29/2022 3:30 PM Medical Record Number: [id number] Patient Account Number: [id number] Date of Birth/Sex: Treating RN: June 01, 1961 (61 y.o. [name], [name] Primary Care [place]: [name] Other Clinician: Referring Diesha Rostad: Treating Elexia Friedt/Extender: [name] in Treatment: 1 Visit Information History Since Last Visit Added or deleted any medications: No Patient Arrived: Ambulatory Any new allergies or adverse reactions: No Arrival Time: 15:37 Had a fall or experienced change in No Accompanied By: self activities of daily living that may affect Transfer Assistance: None risk of falls: Patient Identification Verified: Yes Signs or symptoms of abuse/neglect since last visito No Secondary Verification Process Completed: Yes Hospitalized since last visit: No Patient Requires Transmission-Based Precautions: No Implantable device outside of the clinic excluding No Patient Has Alerts: No cellular tissue based products placed in the center since last visit: Has Dressing in Place as Prescribed: Yes Has Compression in Place as Prescribed: Yes Pain Present Now: Yes Electronic Signature(s) Signed: 05/29/2022 4:29:03 PM By: Baruch Gouty RN, BSN Entered By: Baruch Gouty on 05/29/2022 15:47:39 -------------------------------------------------------------------------------- Compression Therapy Details Patient Name: Date of Service: [name], [name] 05/29/2022 3:30 PM Medical Record Number: [id number] Patient Account Number: [id number] Date of Birth/Sex: Treating RN: 13-Mar-1961 (61 y.o. [name] Primary Care Neela Zecca: [name] Other Clinician: Referring Orly Quimby: Treating Nehan Flaum/Extender: Burley Saver Weeks in Treatment:  1 Compression Therapy Performed for Wound Assessment: Wound #21 Left,Lateral Lower Leg Performed By: Clinician Baruch Gouty, RN Compression Type: Three Layer Post Procedure Diagnosis Same as Pre-procedure Electronic Signature(s) Signed: 05/29/2022 4:29:03 PM By: Baruch Gouty RN, BSN Entered By: Baruch Gouty on 05/29/2022 [place], [place] ([id number]) [id number] Page 2 of 9 -------------------------------------------------------------------------------- Encounter Discharge Information Details Patient Name: Date of Service: [name] RD, [place] RO LYN 05/29/2022 3:30 PM Medical Record Number: [id number] Patient Account Number: [id number] Date of Birth/Sex: Treating RN: 1960/12/16 (61 y.o. [name] Primary Care Yeison Sippel: [name] Other Clinician: Referring Shakela Donati: Treating Ayaan Shutes/Extender: [name] in Treatment: 1 Encounter Discharge Information Items Post Procedure Vitals Discharge Condition: Stable Temperature (F): 98.7 Ambulatory Status: Ambulatory Pulse (bpm): 79 Discharge Destination: Home Respiratory Rate (breaths/min): 18 Transportation: Private Auto Blood Pressure (mmHg): 176/83 Accompanied By: self Schedule Follow-up Appointment: Yes Clinical Summary of Care: Patient Declined Electronic Signature(s) Signed: 05/29/2022 4:29:03 PM By: Baruch Gouty RN, BSN Entered By: Baruch Gouty on 05/29/2022 16:23:52 -------------------------------------------------------------------------------- Lower Extremity Assessment Details Patient Name: Date of Service: [name], [name] 05/29/2022 3:30 PM Medical Record Number: [id number] Patient Account Number: [id number] Date of Birth/Sex: Treating RN: Aug 01, 1961 (61 y.o. [name] Primary Care Valor Quaintance: [name] Other Clinician: Referring Pasqualino Witherspoon: Treating Wadell Craddock/Extender: Burley Saver Weeks in Treatment: 1 Edema  Assessment Assessed: [Left: No] [Right: No] Edema: [Left: Ye] [Right: s] Calf Left: Right: Point of Measurement: 40 cm From Medial Instep 39 cm Ankle Left: Right: Point of Measurement: 10 cm From Medial Instep 26 cm Vascular Assessment Pulses: Dorsalis Pedis Palpable: [Left:Yes] Electronic Signature(s) Signed: 05/29/2022 4:29:03 PM By: Baruch Gouty RN, BSN Entered By: Baruch Gouty on 05/29/2022 15:52:38 Multi Wound Chart Details -------------------------------------------------------------------------------- [name] ([id number]) [id number] Page 3 of 9 Patient Name: Date of Service: [name] RD, [place] RO LYN 05/29/2022 3:30 PM Medical Record Number: [id number] Patient Account Number: [id number] Date of Birth/Sex: Treating RN: October 25, 1960 (62 y.o. [name] Primary Care [name]: Laurance Flatten,  Doreene Burke Other Clinician: Referring Lether Tesch: Treating Lavine Hargrove/Extender: [name] in Treatment: 1 Vital Signs Height(in): 68 Pulse(bpm): 69 Weight(lbs): 193 Blood Pressure(mmHg): 176/83 Body Mass Index(BMI): 29.3 Temperature(F): 98.7 Respiratory Rate(breaths/min): 18 Wound Assessments Wound Number: 21 22 N/A Photos: N/A Left, Lateral Lower Leg Left, Distal, Lateral Lower Leg N/A Wound Location: Gradually Appeared Gradually Appeared N/A Wounding Event: Venous Leg Ulcer Venous Leg Ulcer N/A Primary Etiology: Angina, Congestive Heart Failure, Angina, Congestive Heart Failure, N/A Comorbid History: Hypertension, Peripheral Venous Hypertension, Peripheral Venous Disease Disease 04/28/2022 04/28/2022 N/A Date Acquired: 1 1 N/A Weeks of Treatment: Open Healed - Epithelialized N/A Wound Status: No No N/A Wound Recurrence: 3.5x2.5x0.1 0x0x0 N/A Measurements L x W x D (cm) 6.872 0 N/A A (cm) : rea 0.687 0 N/A Volume (cm) : 70.60% 100.00% N/A % Reduction in A rea: 70.60% 100.00% N/A % Reduction in Volume: Full  Thickness With Exposed Support Full Thickness With Exposed Support N/A Classification: Structures Structures Medium None Present N/A Exudate A mount: Serosanguineous N/A N/A Exudate Type: red, brown N/A N/A Exudate Color: Distinct, outline attached N/A N/A Wound Margin: Large (67-100%) None Present (0%) N/A Granulation A mount: Red, Pink N/A N/A Granulation Quality: Small (1-33%) None Present (0%) N/A Necrotic A mount: Fat Layer (Subcutaneous Tissue): Yes Fascia: No N/A Exposed Structures: Fascia: No Fat Layer (Subcutaneous Tissue): No Tendon: No Tendon: No Muscle: No Muscle: No Joint: No Joint: No Bone: No Bone: No Medium (34-66%) Large (67-100%) N/A Epithelialization: Debridement - Selective/Open Wound N/A N/A Debridement: Pre-procedure Verification/Time Out 16:05 N/A N/A Taken: Lidocaine 5% topical ointment N/A N/A Pain Control: Slough N/A N/A Tissue Debrided: Non-Viable Tissue N/A N/A Level: 1 N/A N/A Debridement A (sq cm): rea Curette N/A N/A Instrument: Minimum N/A N/A Bleeding: Pressure N/A N/A Hemostasis A chieved: 0 N/A N/A Procedural Pain: 0 N/A N/A Post Procedural Pain: Procedure was tolerated well N/A N/A Debridement Treatment Response: 3.5x2.5x0.1 N/A N/A Post Debridement Measurements L x W x D (cm) 0.687 N/A N/A Post Debridement Volume: (cm) Scarring: Yes No Abnormalities Noted N/A Periwound Skin Texture: Dry/Scaly: Yes Dry/Scaly: Yes N/A Periwound Skin Moisture: Hemosiderin Staining: Yes Hemosiderin Staining: Yes N/A Periwound Skin Color: No Abnormality No Abnormality N/A Temperature: Yes N/A N/A Tenderness on Palpation: Compression Therapy N/A N/A Procedures Performed: Debridement Crom, Massie ([id number]) [id number] Page 4 of 9 Treatment Notes Electronic Signature(s) Signed: 05/29/2022 4:11:45 PM By: Fredirick Maudlin MD FACS Signed: 05/29/2022 4:29:03 PM By: Baruch Gouty RN, BSN Entered  By: Fredirick Maudlin on 05/29/2022 16:11:45 -------------------------------------------------------------------------------- Multi-Disciplinary Care Plan Details Patient Name: Date of Service: [name], [name] 05/29/2022 3:30 PM Medical Record Number: [id number] Patient Account Number: [id number] Date of Birth/Sex: Treating RN: 18-Nov-1960 (61 y.o. [name] Primary Care Darryon Bastin: [name] Other Clinician: Referring Tarique Loveall: Treating Luetta Piazza/Extender: [name] in Treatment: 1 Multidisciplinary Care Plan reviewed with physician Active Inactive Venous Leg Ulcer Nursing Diagnoses: Actual venous Insuffiency (use after diagnosis is confirmed) Knowledge deficit related to disease process and management Goals: Patient will maintain optimal edema control Date Initiated: 05/19/2022 Target Resolution Date: 06/16/2022 Goal Status: Active Patient/caregiver will verbalize understanding of disease process and disease management Date Initiated: 05/19/2022 Target Resolution Date: 06/16/2022 Goal Status: Active Interventions: Assess peripheral edema status every visit. Compression as ordered Provide education on venous insufficiency Treatment Activities: Therapeutic compression applied : 05/19/2022 Notes: Wound/Skin Impairment Nursing Diagnoses: Impaired tissue integrity Knowledge deficit related to ulceration/compromised skin integrity Goals: Patient/caregiver will verbalize understanding of skin care regimen  Date Initiated: 05/19/2022 Target Resolution Date: 06/16/2022 Goal Status: Active Ulcer/skin breakdown will have a volume reduction of 30% by week 4 Date Initiated: 05/19/2022 Target Resolution Date: 05/19/2022 Goal Status: Active Interventions: Assess patient/caregiver ability to obtain necessary supplies Assess patient/caregiver ability to perform ulcer/skin care regimen upon admission and as needed Assess ulceration(s) every  visit Provide education on ulcer and skin care Treatment Activities: Skin care regimen initiated : 05/19/2022 ADAMARIE, IZZO ([id number]) [id number] Page 5 of 9 Topical wound management initiated : 05/19/2022 Notes: Electronic Signature(s) Signed: 05/29/2022 4:29:03 PM By: Baruch Gouty RN, BSN Entered By: Baruch Gouty on 05/29/2022 16:03:22 -------------------------------------------------------------------------------- Pain Assessment Details Patient Name: Date of Service: [name], [name] 05/29/2022 3:30 PM Medical Record Number: [id number] Patient Account Number: [id number] Date of Birth/Sex: Treating RN: 09-05-1960 (61 y.o. [name] Primary Care Ryenn Howeth: [name] Other Clinician: Referring Fredda Clarida: Treating Damyn Weitzel/Extender: Burley Saver Weeks in Treatment: 1 Active Problems Location of Pain Severity and Description of Pain Patient Has Paino Yes Site Locations Pain Location: Pain in Ulcers With Dressing Change: Yes Duration of the Pain. Constant / Intermittento Intermittent Rate the pain. Current Pain Level: 7 Least Pain Level: 2 Character of Pain Describe the Pain: Burning, Other: itches Pain Management and Medication Current Pain Management: Medication: Yes Is the Current Pain Management Adequate: Adequate Rest: Yes How does your wound impact your activities of daily livingo Sleep: No Bathing: No Appetite: No Relationship With Others: No Bladder Continence: No Emotions: No Bowel Continence: No Work: No Toileting: No Drive: No Dressing: No Hobbies: No Electronic Signature(s) Signed: 05/29/2022 4:29:03 PM By: Baruch Gouty RN, BSN Entered By: Baruch Gouty on 05/29/2022 15:48:49 [name] ([id number]) [id number] Page 6 of 9 -------------------------------------------------------------------------------- Patient/Caregiver Education Details Patient  Name: Date of Service: Renato Gails, [place] RO LYN 10/9/2023andnbsp3:30 PM Medical Record Number: [id number] Patient Account Number: [id number] Date of Birth/Gender: Treating RN: 1960-08-30 (61 y.o. [name] Primary Care Physician: [name] Other Clinician: Referring Physician: Treating Physician/Extender: [name] in Treatment: 1 Education Assessment Education Provided To: Patient Education Topics Provided Venous: Methods: Explain/Verbal Responses: Reinforcements needed, State content correctly Wound/Skin Impairment: Methods: Explain/Verbal Responses: Reinforcements needed, State content correctly Electronic Signature(s) Signed: 05/29/2022 4:29:03 PM By: Baruch Gouty RN, BSN Entered By: Baruch Gouty on 05/29/2022 16:03:40 -------------------------------------------------------------------------------- Wound Assessment Details Patient Name: Date of Service: [name], [name] 05/29/2022 3:30 PM Medical Record Number: [id number] Patient Account Number: [id number] Date of Birth/Sex: Treating RN: 1960/08/29 (61 y.o. [name] Primary Care Dimple Bastyr: [name] Other Clinician: Referring Giancarlo Askren: Treating Lahela Woodin/Extender: Burley Saver Weeks in Treatment: 1 Wound Status Wound Number: 21 Primary Venous Leg Ulcer Etiology: Wound Location: Left, Lateral Lower Leg Wound Status: Open Wounding Event: Gradually Appeared Comorbid Angina, Congestive Heart Failure, Hypertension, Peripheral Date Acquired: 04/28/2022 History: Venous Disease Weeks Of Treatment: 1 Clustered Wound: No Photos Fuhrman, Millissa ([id number]) [id number] Page 7 of 9 Wound Measurements Length: (cm) 3.5 Width: (cm) 2.5 Depth: (cm) 0.1 Area: (cm) 6.872 Volume: (cm) 0.687 % Reduction in Area: 70.6% % Reduction in Volume: 70.6% Epithelialization: Medium (34-66%) Tunneling: No Undermining: No Wound  Description Classification: Full Thickness With Exposed Suppo Wound Margin: Distinct, outline attached Exudate Amount: Medium Exudate Type: Serosanguineous Exudate Color: red, brown rt Structures Foul Odor After Cleansing: No Slough/Fibrino Yes Wound Bed Granulation Amount: Large (67-100%) Exposed Structure Granulation Quality: Red, Pink Fascia Exposed: No Necrotic Amount: Small (1-33%) Fat Layer (Subcutaneous Tissue) Exposed: Yes Necrotic Quality: Adherent Slough  Tendon Exposed: No Muscle Exposed: No Joint Exposed: No Bone Exposed: No Periwound Skin Texture Texture Color No Abnormalities Noted: No No Abnormalities Noted: No Scarring: Yes Hemosiderin Staining: Yes Moisture Temperature / Pain No Abnormalities Noted: No Temperature: No Abnormality Dry / Scaly: Yes Tenderness on Palpation: Yes Treatment Notes Wound #21 (Lower Leg) Wound Laterality: Left, Lateral Cleanser Peri-Wound Care Sween Lotion (Moisturizing lotion) Discharge Instruction: Apply moisturizing lotion as directed Topical Primary Dressing KerraCel Ag Gelling Fiber Dressing, 4x5 in (silver alginate) Discharge Instruction: Apply silver alginate to wound bed as instructed Secondary Dressing Woven Gauze Sponge, Non-Sterile 4x4 in Discharge Instruction: Apply over primary dressing as directed. Secured With Compression Wrap FourPress (4 layer compression wrap) Discharge Instruction: Apply four layer compression as directed. May also use Miliken CoFlex 2 layer compression system as alternative. Compression Stockings Add-Ons Electronic Signature(s) Signed: 05/29/2022 4:29:03 PM By: Baruch Gouty RN, BSN Entered By: Baruch Gouty on 05/29/2022 16:00:30 Wound Assessment Details -------------------------------------------------------------------------------- [name] ([id number]) [id number] Page 8 of 9 Patient Name: Date of Service: [name] RD, [place] RO LYN 05/29/2022 3:30  PM Medical Record Number: [id number] Patient Account Number: [id number] Date of Birth/Sex: Treating RN: 11-Aug-1961 (61 y.o. [name] Primary Care Carmela Piechowski: [name] Other Clinician: Referring Casy Brunetto: Treating Aveon Colquhoun/Extender: Burley Saver Weeks in Treatment: 1 Wound Status Wound Number: 22 Primary Venous Leg Ulcer Etiology: Wound Location: Left, Distal, Lateral Lower Leg Wound Status: Healed - Epithelialized Wounding Event: Gradually Appeared Comorbid Angina, Congestive Heart Failure, Hypertension, Peripheral Date Acquired: 04/28/2022 History: Venous Disease Weeks Of Treatment: 1 Clustered Wound: No Photos Wound Measurements Length: (cm) 0 % Reduction in Area: 100% Width: (cm) 0 % Reduction in Volume: 100% Depth: (cm) 0 Epithelialization: Large (67-100%) Area: (cm) 0 Tunneling: No Volume: (cm) 0 Undermining: No Wound Description Classification: Full Thickness With Exposed Support Structures Foul Odor After Cleansing: No Exudate Amount: None Present Slough/Fibrino No Wound Bed Granulation Amount: None Present (0%) Exposed Structure Necrotic Amount: None Present (0%) Fascia Exposed: No Fat Layer (Subcutaneous Tissue) Exposed: No Tendon Exposed: No Muscle Exposed: No Joint Exposed: No Bone Exposed: No Periwound Skin Texture Texture Color No Abnormalities Noted: Yes No Abnormalities Noted: No Hemosiderin Staining: Yes Moisture No Abnormalities Noted: No Temperature / Pain Dry / Scaly: Yes Temperature: No Abnormality Electronic Signature(s) Signed: 05/29/2022 4:29:03 PM By: Baruch Gouty RN, BSN Entered By: Baruch Gouty on 05/29/2022 16:01:02 -------------------------------------------------------------------------------- [place] Details Patient Name: Date of Service: [name], [name] 05/29/2022 3:30 PM Medical Record Number: [id number] Patient Account Number: [id number] [name] ([id number])  [id number] Page 9 of 9 Date of Birth/Sex: Treating RN: 03/24/1961 (61 y.o. [name] Primary Care Tabari Volkert: Other Clinician: [name] Referring Nuvia Hileman: Treating Zyanna Leisinger/Extender: [name] in Treatment: 1 Vital Signs Time Taken: 13:47 Temperature (F): 98.7 Height (in): 68 Pulse (bpm): 79 Weight (lbs): 193 Respiratory Rate (breaths/min): 18 Body Mass Index (BMI): 29.3 Blood Pressure (mmHg): 176/83 Reference Range: 80 - 120 mg / dl Electronic Signature(s) Signed: 05/29/2022 4:29:03 PM By: Baruch Gouty RN, BSN Entered By: Baruch Gouty on 05/29/2022 15:48:03

## 2022-05-29 NOTE — Progress Notes (Signed)
NAVREET, BOLDA ([id number]) [id number] Page 1 of 10 Visit Report for 05/29/2022 Chief Complaint Document Details Patient Name: Date of Service: [name], [name] 05/29/2022 3:30 PM Medical Record Number: [id number] Patient Account Number: [id number] Date of Birth/Sex: Treating RN: [birth year] (61 y.o. [name] Primary Care Provider: [name] Other Clinician: Referring Provider: Treating Provider/Extender: [name] in Treatment: 1 Information Obtained from: Patient Chief Complaint Patient presents for treatment of an open ulcer due to venous insufficiency. she has had a recurrent ulceration of the left lower extremity for about 3 weeks 05/19/2022: The patient returns after a 5-year absence with a recurrent ulceration on the left lower extremity at the same site as previous wounds Electronic Signature(s) Signed: 05/29/2022 4:11:53 PM By: Fredirick Maudlin MD FACS Entered By: Fredirick Maudlin on 05/29/2022 16:11:53 -------------------------------------------------------------------------------- Debridement Details Patient Name: Date of Service: HILLIA Chavez, CA RO Chavez 05/29/2022 3:30 PM Medical Record Number: [id number] Patient Account Number: [id number] Date of Birth/Sex: Treating RN: [birth year] (61 y.o. [name] Primary Care Provider: [name] Other Clinician: Referring Provider: Treating Provider/Extender: Burley Saver Weeks in Treatment: 1 Debridement Performed for Assessment: Wound #21 Left,Lateral Lower Leg Performed By: Physician Fredirick Maudlin, MD Debridement Type: Debridement Severity of Tissue Pre Debridement: Fat layer exposed Level of Consciousness (Pre-procedure): Awake and Alert Pre-procedure Verification/Time Out Yes - 16:05 Taken: Pain Control: Lidocaine 5% topical ointment T Area Debrided (L x W): otal 1 (cm) x 1 (cm) = 1 (cm) Tissue and other material debrided:  Non-Viable, Slough, Slough Level: Non-Viable Tissue Debridement Description: Selective/Open Wound Instrument: Curette Bleeding: Minimum Hemostasis Achieved: Pressure Procedural Pain: 0 Post Procedural Pain: 0 Response to Treatment: Procedure was tolerated well Level of Consciousness (Post- Awake and Alert procedure): Post Debridement Measurements of Total Wound Length: (cm) 3.5 Width: (cm) 2.5 Depth: (cm) 0.1 Volume: (cm) 0.687 Character of Wound/Ulcer Post Debridement: Improved Severity of Tissue Post Debridement: Fat layer exposed Chavez, Summer ([id number]) [id number] Page 2 of 10 Post Procedure Diagnosis Same as Pre-procedure Notes scribed by Baruch Gouty, RN for [facility] Electronic Signature(s) Signed: 05/29/2022 4:27:52 PM By: Fredirick Maudlin MD FACS Signed: 05/29/2022 4:29:03 PM By: Baruch Gouty RN, BSN Previous Signature: 05/29/2022 4:19:05 PM Version By: Fredirick Maudlin MD FACS Entered By: Baruch Gouty on 05/29/2022 16:24:29 -------------------------------------------------------------------------------- HPI Details Patient Name: Date of Service: HILLIA Chavez, CA RO Chavez 05/29/2022 3:30 PM Medical Record Number: [id number] Patient Account Number: [id number] Date of Birth/Sex: Treating RN: 1961/02/27 (61 y.o. [name] Primary Care Provider: [name] Other Clinician: Referring Provider: Treating Provider/Extender: Burley Saver Weeks in Treatment: 1 History of Present Illness Location: Ulcer on the left lower extremity Quality: Patient reports experiencing a sharp pain to affected area(s). Severity: Presents with a pain of ____8_on a pain scale of 1-10. Duration: Patient has had the wound for < 2 weeks prior to presenting for treatment Timing: Pain in wound is constant (hurts all the time) Context: The wound would happen gradually Modifying Factors: Patient wound(s)/ulcer(s) are improving due  YP:[id number] her compression stockings regularly ssociated Signs and Symptoms: Patient reports presence of swelling A HPI Description: 61 year old patient who is known to have significant hypertension, venous stasis ulcer, diastolic heart failure, hypokalemia, CHF, chronic bronchitis and nicotine addiction has been seen in the wound center previously and now comes back with a left lower extremity ulceration. She says she's been wearing her stockings regularly but I believe she's been noncompliant from the description of when she wears it and  how often she wears it. She also sees her PCP on a regular basis but I believe her blood pressure has been running very high and her diastolic has been as high as 130. Her systolic normally runs in the low 200s. She has had her last venous study done in 2014 and this was a DVT study. She has not had any endovenous ablation. 06/09/2015 -- her arterial studies revealed that her right ABI was 1.14 and the left ABI was 1.18. Her venous duplex study revealed there was no evidence of DVT or SVT There was deep vein reflux in the left common femoral vein. There was also reflux . noted in the left saphenofemoral junction and proximal to the mid great saphenous vein. In view of this I am going to recommend a vascular surgery opinion to see what procedure would best serve her. 06/16/2015 -- her vascular opinion is on November 8 until then we will continue with local care. 06/23/2015 -- she continues to smoke about 4 cigarettes a day and other than that has been very compliant with her management. 06/30/2015 -- she saw Dr. Kellie Simmering recently who recommended: [name] is a 61 y.o. female who presents with: bilateral lower extremity chronic venous insufficiency (C6). The patient has a history of bilateral lower extremity venous stasis ulceration. Her current left leg wound is healing with unna boot application. All of her previous venous stasis ulcers have healed within  4 weeks. Her great saphenous veins bilaterally on sonosite exam were not markedly dilated. Since her current left leg wound is healing, will have her follow up in 3 months for further evaluation. She has adequate arterial circulation. She will continue to go to the wound center for unna boots. Recommended use of compression stocks for swelling management once her left leg wound hass healed. Advised her to also wear compression on her right leg. 07/14/2015 -- she does not have her stockings with her today but she is going right from here to the store to buy this compression stockings. He still continues to smoke couple of cigarettes a day 10/07/15; the patient returns today with a recurrent inflamed ulceration on the lateral left leg. We know her from previous recurrent ulcerations on her legs largely related to venous insufficiency. Previous arterial evaluation in October 2016 showed ABI of 1.14 on the right 1.18 on the left with triphasic waves in both the posterior tibial and dorsalis pedis. She does not have a significant arterial issue. She has been followed by Dr. [facility] She has deep vein reflux noted in the left common femoral vein and reflux noted in the left saphenofemoral junction and proximal to mid great saphenous vein although his last note seems to suggest that no interventions at this time. In the meantime she is back here with a significant inflamed macerated area on her left lateral leg which is open already but certainly is a threatening larger ulceration. 10/14/15; the area looks a lot better than last week. I was concerned about a large impending ulceration or multiple small ulcerations however with edema control, and perhaps the doxycycline things actually look a lot better. Probably mostly edema control. We have been using silver alginate and Profore 10/21/15; only one dime-sized open area remains. This required surgical debridement. There is no evidence  rounding infection she is completing her doxycycline. Edema control appears adequate. Still severe surrounding venous stasis and inflammation 10/28/15; wound is continuing to contractor. No debridement was done. There is no evidence of  surrounding infection. She has chronic venous insufficiency with inflammation however her edema is well controlled no other issues were seen 11/05/15 wound continues to contractor. Surgical debridement done of nonviable's edges surface slough. This continues to look as though it is contracting 11/11/15 wound continues to contract and epithelialize. No debridement was necessary. This looks as though it is progressing towards closure next week Kienast, Alysiana ([id number]) [id number] Page 3 of 10 11/18/15 wound is healed. READMISSION 06/08/16; patient we know previously who has chronic venous insufficiency with inflammation and recurrent lower extremity ulceration. She has been in this clinic at least 2 prior occasions most recently earlier this year with a wound on her lateral left leg. We discharge during graded pressure stockings. She is not a diabetic. She has not have known PAD. She states is slightly over a month ago she developed increasing swelling in the right leg and an open wound on the lateral aspect of her right calf which is been getting worse over the last 3 weeks she's trying to Dr. this herself with topical antibiotics this is not work she is back in our clinic for review of that 06/15/16; still a nonviable surface that requires debridement still using Santyl 06/22/16; the patient has a new wound on her right lateral foot of uncertain etiology there is some tenderness around this area 06/29/16; the area on the right lateral leg appears to be better. His area on the right lateral foot is no better 07/06/16; she has a healthy wound on the right lateral leg however this is not appear to be smaller than last week 3.1 x 2.7 x 0.2. Her  area on the right lateral foot really looks about the same 07/20/16; some improvement are noted in both wounds on the right lateral calf also on the right lateral foot. Improvement in dimensions. 08/03/16; left her wrap on for 2 weeks. However both wound areas look improved the right lateral calf in terms of dimensions and also the right lateral foot. Still debridement in the right lateral Calf 08/17/16; the area on the right lateral foot has healed. The area on the right lateral calf improved again she leaves her wrap on for 2 weeks am not sure why this is. 08/31/16; the area on the right lateral foot remains healed. The area on the right lateral calf again is a very tiny wound area that should be healed next week 0.6 x 0.5 x 0.1 09/21/16; patient returns after a three-week hiatus I think with the same dressing on. As predicted 3 weeks ago the wound is actually healed. She has 15 mm TED hose. READMISSION 05/19/2022 This is a now 61 year old woman with a past medical history of venous insufficiency, CKD stage III, hypertension secondary to hyperaldosteronism, and previous venous ulcers on her legs. 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 The wound is on her left lateral lower leg. There is a lot of crusted eschar and slough present. She says the wound is very tender. 05/29/2022: The wound is markedly improved this week. All of the crusting and eschar is gone and there has been substantial epithelialization. There is just a little bit of slough present with good granulation tissue. Electronic Signature(s) Signed: 05/29/2022 4:12:33 PM By: Fredirick Maudlin MD FACS Entered By: Fredirick Maudlin on 05/29/2022 16:12:33 -------------------------------------------------------------------------------- Physical Exam Details Patient Name: Date of Service: HILLIA Chavez, CA RO Chavez 05/29/2022 3:30 PM Medical Record Number: [id number] Patient  Account  Number: [id number] Date of Birth/Sex: Treating RN: [birth year] (61 y.o. [name] Primary Care Provider: [name] Other Clinician: Referring Provider: Treating Provider/Extender: Burley Saver Weeks in Treatment: 1 Constitutional Hypertensive, asymptomatic. . . . No acute distress.Marland Kitchen Respiratory Normal work of breathing on room air.. Notes 05/29/2022: The wound is markedly improved this week. All of the crusting and eschar is gone and there has been substantial epithelialization. There is just a little bit of slough present with good granulation tissue. Electronic Signature(s) Signed: 05/29/2022 4:13:20 PM By: Fredirick Maudlin MD FACS Entered By: Fredirick Maudlin on 05/29/2022 16:13:20 -------------------------------------------------------------------------------- Physician Orders Details Patient Name: Date of Service: HILLIA Chavez, CA RO Chavez 05/29/2022 3:30 PM Medical Record Number: [id number] Patient Account Number: [id number] RYELYNN, GUEDEA ([id number]) [id number] Page 4 of 10 Date of Birth/Sex: Treating RN: 1960-12-18 (61 y.o. [name], Vaughan Basta Primary Care Provider: Other Clinician: [name] Referring Provider: Treating Provider/Extender: [name] in Treatment: 1 Verbal / Phone Orders: No Diagnosis Coding ICD-10 Coding Code Description [phone] Varicose veins of left lower extremity with other complications G01.74 Chronic kidney disease, stage 3 unspecified L97.822 Non-pressure chronic ulcer of other part of left lower leg with fat layer exposed Follow-up Appointments ppointment in 1 week. - Dr. Celine Ahr RM 1 Return A Anesthetic Wound #21 Left,Lateral Lower Leg (In clinic) Topical Lidocaine 5% applied to wound bed Bathing/ Shower/ Hygiene May shower with protection but do not get wound dressing(s) wet. Edema Control - Lymphedema / SCD / Other Elevate legs to the level of the  heart or above for 30 minutes daily and/or when sitting, a frequency of: Avoid standing for long periods of time. Patient to wear own compression stockings every day. - right leg daily Exercise regularly Wound Treatment Wound #21 - Lower Leg Wound Laterality: Left, Lateral Peri-Wound Care: Sween Lotion (Moisturizing lotion) 1 x Per Week/30 Days Discharge Instructions: Apply moisturizing lotion as directed Prim Dressing: KerraCel Ag Gelling Fiber Dressing, 4x5 in (silver alginate) 1 x Per Week/30 Days ary Discharge Instructions: Apply silver alginate to wound bed as instructed Secondary Dressing: Woven Gauze Sponge, Non-Sterile 4x4 in 1 x Per Week/30 Days Discharge Instructions: Apply over primary dressing as directed. Compression Wrap: FourPress (4 layer compression wrap) 1 x Per Week/30 Days Discharge Instructions: Apply four layer compression as directed. May also use Miliken CoFlex 2 layer compression system as alternative. Patient Medications llergies: No Known Allergies A Notifications Medication Indication Start End prior to debridement 05/29/2022 lidocaine DOSE topical 5 % ointment - ointment topical Electronic Signature(s) Signed: 05/29/2022 4:19:05 PM By: Fredirick Maudlin MD FACS Entered By: Fredirick Maudlin on 05/29/2022 16:14:20 -------------------------------------------------------------------------------- Problem List Details Patient Name: Date of Service: HILLIA Chavez, CA RO Chavez 05/29/2022 3:30 PM Medical Record Number: [id number] Patient Account Number: [id number] Date of Birth/Sex: Treating RN: 1961-04-13 (61 y.o. [name] Primary Care Provider: [name] Other Clinician: Referring Provider: Treating Provider/Extender: Burley Saver [place], Hoyle Sauer ([id number]) [id number] Page 5 of 10 Weeks in Treatment: 1 Active Problems ICD-10 Encounter Code Description Active Date MDM Diagnosis I83.892 Varicose veins  of left lower extremity with other complications 3/57/0177 No Yes N18.30 Chronic kidney disease, stage 3 unspecified 05/19/2022 No Yes L97.822 Non-pressure chronic ulcer of other part of left lower leg with fat layer exposed9/29/2023 No Yes Inactive Problems Resolved Problems Electronic Signature(s) Signed: 05/29/2022 4:11:39 PM By: Fredirick Maudlin MD FACS Entered By: Fredirick Maudlin on 05/29/2022 16:11:39 -------------------------------------------------------------------------------- Progress Note Details Patient Name: Date of Service: HILLIA Chavez, CA  RO Chavez 05/29/2022 3:30 PM Medical Record Number: [id number] Patient Account Number: [id number] Date of Birth/Sex: Treating RN: 1961-06-10 (61 y.o. [name] Primary Care Provider: [name] Other Clinician: Referring Provider: Treating Provider/Extender: [name] in Treatment: 1 Subjective Chief Complaint Information obtained from Patient Patient presents for treatment of an open ulcer due to venous insufficiency. she has had a recurrent ulceration of the left lower extremity for about 3 weeks 05/19/2022: The patient returns after a 5-year absence with a recurrent ulceration on the left lower extremity at the same site as previous wounds History of Present Illness (HPI) The following HPI elements were documented for the patient's wound: Location: Ulcer on the left lower extremity Quality: Patient reports experiencing a sharp pain to affected area(s). Severity: Presents with a pain of ____8_on a pain scale of 1-10. Duration: Patient has had the wound for < 2 weeks prior to presenting for treatment Timing: Pain in wound is constant (hurts all the time) Context: The wound would happen gradually Modifying Factors: Patient wound(s)/ulcer(s) are improving due [id number] her compression stockings regularly Associated Signs and Symptoms: Patient reports presence of swelling 61 year old patient who is known  to have significant hypertension, venous stasis ulcer, diastolic heart failure, hypokalemia, CHF, chronic bronchitis and nicotine addiction has been seen in the wound center previously and now comes back with a left lower extremity ulceration. She says she's been wearing her stockings regularly but I believe she's been noncompliant from the description of when she wears it and how often she wears it. She also sees her PCP on a regular basis but I believe her blood pressure has been running very high and her diastolic has been as high as 130. Her systolic normally runs in the low 200s. She has had her last venous study done in 2014 and this was a DVT study. She has not had any endovenous ablation. 06/09/2015 -- her arterial studies revealed that her right ABI was 1.14 and the left ABI was 1.18. Her venous duplex study revealed there was no evidence of DVT or SVT There was deep vein reflux in the left common femoral vein. There was also reflux . noted in the left saphenofemoral junction and proximal to the mid great saphenous vein. In view of this I am going to recommend a vascular surgery opinion to see what procedure would best serve her. 06/16/2015 -- her vascular opinion is on November 8 until then we will continue with local care. 06/23/2015 -- she continues to smoke about 4 cigarettes a day and other than that has been very compliant with her management. 06/30/2015 -- she saw Dr. Kellie Simmering recently who recommended: Summer, Chavez ([id number]) [id number] Page 6 of 10 Laretta Pyatt is a 61 y.o. female who presents with: bilateral lower extremity chronic venous insufficiency (C6).  The patient has a history of bilateral lower extremity venous stasis ulceration. Her current left leg wound is healing with unna boot application. All of her previous venous stasis ulcers have healed within 4 weeks.  Her great saphenous veins bilaterally on sonosite exam were not  markedly dilated. Since her current left leg wound is healing, will have her follow up in 3 months for further evaluation.  She has adequate arterial circulation.  She will continue to go to the wound center for unna boots.  Recommended use of compression stocks for swelling management once her left leg wound hass healed. Advised her to also wear compression on her right leg. 07/14/2015 -- she does not  have her stockings with her today but she is going right from here to the store to buy this compression stockings. He still continues to smoke couple of cigarettes a day 10/07/15; the patient returns today with a recurrent inflamed ulceration on the lateral left leg. We know her from previous recurrent ulcerations on her legs largely related to venous insufficiency. Previous arterial evaluation in October 2016 showed ABI of 1.14 on the right 1.18 on the left with triphasic waves in both the posterior tibial and dorsalis pedis. She does not have a significant arterial issue. She has been followed by Dr. Kell[facility]. She has deep vein reflux noted in the left common femoral vein and reflux noted in the left saphenofemoral junction and proximal to mid great saphenous vein although his last note seems to suggest that no interventions at this time. In the meantime she is back here with a significant inflamed macerated area on her left lateral leg which is open already but certainly is a threatening larger ulceration. 10/14/15; the area looks a lot better than last week. I was concerned about a large impending ulceration or multiple small ulcerations however with edema control, and perhaps the doxycycline things actually look a lot better. Probably mostly edema control. We have been using silver alginate and Profore 10/21/15; only one dime-sized open area remains. This required surgical debridement. There is no evidence rounding infection she is completing her doxycycline. Edema control  appears adequate. Still severe surrounding venous stasis and inflammation 10/28/15; wound is continuing to contractor. No debridement was done. There is no evidence of surrounding infection. She has chronic venous insufficiency with inflammation however her edema is well controlled no other issues were seen 11/05/15 wound continues to contractor. Surgical debridement done of nonviable's edges surface slough. This continues to look as though it is contracting 11/11/15 wound continues to contract and epithelialize. No debridement was necessary. This looks as though it is progressing towards closure next week 11/18/15 wound is healed. READMISSION 06/08/16; patient we know previously who has chronic venous insufficiency with inflammation and recurrent lower extremity ulceration. She has been in this clinic at least 2 prior occasions most recently earlier this year with a wound on her lateral left leg. We discharge during graded pressure stockings. She is not a diabetic. She has not have known PAD. She states is slightly over a month ago she developed increasing swelling in the right leg and an open wound on the lateral aspect of her right calf which is been getting worse over the last 3 weeks she's trying to Dr. this herself with topical antibiotics this is not work she is back in our clinic for review of that 06/15/16; still a nonviable surface that requires debridement still using Santyl 06/22/16; the patient has a new wound on her right lateral foot of uncertain etiology there is some tenderness around this area 06/29/16; the area on the right lateral leg appears to be better. His area on the right lateral foot is no better 07/06/16; she has a healthy wound on the right lateral leg however this is not appear to be smaller than last week 3.1 x 2.7 x 0.2. Her area on the right lateral foot really looks about the same 07/20/16; some improvement are noted in both wounds on the right lateral calf also on the  right lateral foot. Improvement in dimensions. 08/03/16; left her wrap on for 2 weeks. However both wound areas look improved the right lateral calf in terms of  dimensions and also the right lateral foot. Still debridement in the right lateral Calf 08/17/16; the area on the right lateral foot has healed. The area on the right lateral calf improved again she leaves her wrap on for 2 weeks am not sure why this is. 08/31/16; the area on the right lateral foot remains healed. The area on the right lateral calf again is a very tiny wound area that should be healed next week 0.6 x 0.5 x 0.1 09/21/16; patient returns after a three-week hiatus I think with the same dressing on. As predicted 3 weeks ago the wound is actually healed. She has 15 mm TED hose. READMISSION 05/19/2022 This is a now 61 year old woman with a past medical history of venous insufficiency, CKD stage III, hypertension secondary to hyperaldosteronism, and previous venous ulcers on her legs. 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 The wound is on her left lateral lower leg. There is a lot of crusted eschar and slough present. She says the wound is very tender. 05/29/2022: The wound is markedly improved this week. All of the crusting and eschar is gone and there has been substantial epithelialization. There is just a little bit of slough present with good granulation tissue. Patient History Information obtained from Patient. Family History Cancer - Mother, Diabetes - Mother,Siblings, Heart Disease - Siblings, Hypertension - Mother,Father,Siblings, Kidney Disease - Siblings, No family history of Hereditary Spherocytosis, Lung Disease, Seizures, Stroke, Thyroid Problems, Tuberculosis. Social History Current every day smoker - 2 cigarettes daily, Marital Status - Single, Alcohol Use - Rarely - Beer, Drug Use - No History, Caffeine Use - Rarely - Tea. Medical  History Eyes Denies history of Cataracts, Glaucoma, Optic Neuritis Ear/Nose/Mouth/Throat Denies history of Chronic sinus problems/congestion, Middle ear problems Hematologic/Lymphatic Denies history of Anemia, Hemophilia, Human Immunodeficiency Virus, Lymphedema, Sickle Cell Disease Respiratory Denies history of Aspiration, Asthma, Chronic Obstructive Pulmonary Disease (COPD), Pneumothorax, Sleep Apnea Cardiovascular Patient has history of Angina, Congestive Heart Failure, Hypertension, Peripheral Venous Disease Denies history of Arrhythmia, Coronary Artery Disease, Deep Vein Thrombosis, Hypotension, Myocardial Infarction, Peripheral Arterial Disease, Phlebitis, Vasculitis Gastrointestinal Denies history of Cirrhosis , Colitis, Crohnoos, Hepatitis A, Hepatitis B, Hepatitis C Endocrine Denies history of Type I Diabetes, Type II Diabetes Genitourinary Denies history of End Stage Renal Disease Immunological Denies history of Lupus Erythematosus, Raynaudoos, Scleroderma Integumentary (Skin) Beavers, Aria ([id number]) [id number] Page 7 of 10 Denies history of History of Burn Musculoskeletal Denies history of Gout, Rheumatoid Arthritis, Osteoarthritis, Osteomyelitis Neurologic Denies history of Dementia, Neuropathy, Quadriplegia, Paraplegia, Seizure Disorder Oncologic Denies history of Received Chemotherapy, Received Radiation Psychiatric Denies history of Anorexia/bulimia, Confinement Anxiety Medical A Surgical History Notes nd Genitourinary Kidney disease-stable; sees nephrologist Objective Constitutional Hypertensive, asymptomatic. No acute distress.. Vitals Time Taken: 1:47 PM, Height: 68 in, Weight: 193 lbs, BMI: 29.3, Temperature: 98.7 F, Pulse: 79 bpm, Respiratory Rate: 18 breaths/min, Blood Pressure: 176/83 mmHg. Respiratory Normal work of breathing on room air.. General Notes: 05/29/2022: The wound is markedly improved this week. All of  the crusting and eschar is gone and there has been substantial epithelialization. There is just a little bit of slough present with good granulation tissue. Integumentary (Hair, Skin) Wound #21 status is Open. Original cause of wound was Gradually Appeared. The date acquired was: 04/28/2022. The wound has been in treatment 1 weeks. The wound is located on the Left,Lateral Lower Leg. The wound measures 3.5cm length x 2.5cm width x 0.1cm  depth; 6.872cm^2 area and 0.687cm^3 volume. There is Fat Layer (Subcutaneous Tissue) exposed. There is no tunneling or undermining noted. There is a medium amount of serosanguineous drainage noted. The wound margin is distinct with the outline attached to the wound base. There is large (67-100%) red, pink granulation within the wound bed. There is a small (1- 33%) amount of necrotic tissue within the wound bed including Adherent Slough. The periwound skin appearance exhibited: Scarring, Dry/Scaly, Hemosiderin Staining. Periwound temperature was noted as No Abnormality. The periwound has tenderness on palpation. Wound #22 status is Healed - Epithelialized. Original cause of wound was Gradually Appeared. The date acquired was: 04/28/2022. The wound has been in treatment 1 weeks. The wound is located on the Left,Distal,Lateral Lower Leg. The wound measures 0cm length x 0cm width x 0cm depth; 0cm^2 area and 0cm^3 volume. There is no tunneling or undermining noted. There is a none present amount of drainage noted. There is no granulation within the wound bed. There is no necrotic tissue within the wound bed. The periwound skin appearance had no abnormalities noted for texture. The periwound skin appearance exhibited: Dry/Scaly, Hemosiderin Staining. Periwound temperature was noted as No Abnormality. 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 There was a Selective/Open Wound Non-Viable Tissue Debridement with a total area of 1 sq cm performed by Fredirick Maudlin, MD. With the following instrument(s): Curette to remove Non-Viable tissue/material. Material removed includes [facility] after achieving pain control using Lidocaine 5% topical ointment. No specimens were taken. A time out was conducted at 16:05, prior to the start of the procedure. A Minimum amount of bleeding was controlled with Pressure. The procedure was tolerated well with a pain level of 0 throughout and a pain level of 0 following the procedure. Post Debridement Measurements: 3.5cm length x 2.5cm width x 0.1cm depth; 0.687cm^3 volume. Character of Wound/Ulcer Post Debridement is improved. Severity of Tissue Post Debridement is: Fat layer exposed. Post procedure Diagnosis Wound #21: Same as Pre-Procedure General Notes: scribed by Baruch Gouty, RN for [place]. Pre-procedure diagnosis of Wound #21 is a Venous Leg Ulcer located on the Left,Lateral Lower Leg . There was a Three Layer Compression Therapy Procedure by Baruch Gouty, RN. Post procedure Diagnosis Wound #21: Same as Pre-Procedure Chavez, Summer ([id number]) [id number] Page 8 of 10 Plan Follow-up Appointments: Return Appointment in 1 week. - Dr. Celine Ahr RM 1 Anesthetic: Wound #21 Left,Lateral Lower Leg: (In clinic) Topical Lidocaine 5% applied to wound bed Bathing/ Shower/ Hygiene: May shower with protection but do not get wound dressing(s) wet. Edema Control - Lymphedema / SCD / Other: Elevate legs to the level of the heart or above for 30 minutes daily and/or when sitting, a frequency of: Avoid standing for long periods of time. Patient to wear own  compression stockings every day. - right leg daily Exercise regularly The following medication(s) was prescribed: lidocaine topical 5 % ointment ointment topical for prior to debridement was prescribed at facility WOUND #21: - Lower Leg Wound Laterality: Left, Lateral Peri-Wound Care: Sween Lotion (Moisturizing lotion) 1 x Per Week/30 Days Discharge Instructions: Apply moisturizing lotion as directed Prim Dressing: KerraCel Ag Gelling Fiber Dressing, 4x5 in (silver alginate) 1 x Per Week/30  Days ary Discharge Instructions: Apply silver alginate to wound bed as instructed Secondary Dressing: Woven Gauze Sponge, Non-Sterile 4x4 in 1 x Per Week/30 Days Discharge Instructions: Apply over primary dressing as directed. Com pression Wrap: FourPress (4 layer compression wrap) 1 x Per Week/30 Days Discharge Instructions: Apply four layer compression as directed. May also use [place]iken CoFlex 2 layer compression system as alternative. 05/29/2022: The wound is markedly improved this week. All of the crusting and eschar is gone and there has been substantial epithelialization. There is just a little bit of slough present with good granulation tissue. I used a curette to debride slough and a little bit of eschar from the wounds. I do not think she requires Santyl or gentamicin. She will complete her current course of oral antibiotics (Augmentin). We will use silver alginate and 4-layer compression. She may return to work without restrictions at this time. Follow-up in 1 week. Electronic Signature(s) Signed: 05/29/2022 4:27:52 PM By: Fredirick Maudlin MD FACS Signed: 05/29/2022 4:29:03 PM By: Baruch Gouty RN, BSN Previous Signature: 05/29/2022 4:15:13 PM Version By: Fredirick Maudlin MD FACS Entered By: Baruch Gouty on 05/29/2022 16:24:51 -------------------------------------------------------------------------------- HxROS Details Patient Name: Date of Service: [place] Chavez, [place] RO Chavez 05/29/2022 3:30  PM Medical Record Number: [id number] Patient Account Number: [id number] Date of Birth/Sex: Treating RN: 20-Oct-1960 (61 y.o. [name] Primary Care Provider: [name] Other Clinician: Referring Provider: Treating Provider/Extender: [name] in Treatment: 1 Information Obtained From Patient Eyes Medical History: Negative for: Cataracts; Glaucoma; Optic Neuritis Ear/Nose/Mouth/Throat Medical History: Negative for: Chronic sinus problems/congestion; Middle ear problems Hematologic/Lymphatic Medical History: Negative for: Anemia; Hemophilia; Human Immunodeficiency Virus; Lymphedema; Sickle Cell Disease Chavez, Summer ([id number]) [id number] Page 9 of 10 Respiratory Medical History: Negative for: Aspiration; Asthma; Chronic Obstructive Pulmonary Disease (COPD); Pneumothorax; Sleep Apnea Cardiovascular Medical History: Positive for: Angina; Congestive Heart Failure; Hypertension; Peripheral Venous Disease Negative for: Arrhythmia; Coronary Artery Disease; Deep Vein Thrombosis; Hypotension; Myocardial Infarction; Peripheral Arterial Disease; Phlebitis; Vasculitis Gastrointestinal Medical History: Negative for: Cirrhosis ; Colitis; Crohns; Hepatitis A; Hepatitis B; Hepatitis C Endocrine Medical History: Negative for: Type I Diabetes; Type II Diabetes Genitourinary Medical History: Negative for: End Stage Renal Disease Past Medical History Notes: Kidney disease-stable; sees nephrologist Immunological Medical History: Negative for: Lupus Erythematosus; Raynauds; Scleroderma Integumentary (Skin) Medical History: Negative for: History of Burn Musculoskeletal Medical History: Negative for: Gout; Rheumatoid Arthritis; Osteoarthritis; Osteomyelitis Neurologic Medical History: Negative for: Dementia; Neuropathy; Quadriplegia; Paraplegia; Seizure Disorder Oncologic Medical History: Negative for: Received  Chemotherapy; Received Radiation Psychiatric Medical History: Negative for: Anorexia/bulimia; Confinement Anxiety Immunizations Implantable Devices No devices added Family and Social History Cancer: Yes - Mother; Diabetes: Yes - Mother,Siblings; Heart Disease: Yes - Siblings; Hereditary Spherocytosis: No; Hypertension: Yes - Mother,Father,Siblings; Kidney Disease: Yes - Siblings; Lung Disease: No; Seizures: No; Stroke: No; Thyroid Problems: No; Tuberculosis: No; Current every day smoker - 2 cigarettes daily; Marital Status - Single; Alcohol Use: Rarely - Beer; Drug Use: No History; Caffeine Use: Rarely - T Financial Concerns: ea; Yes; Food, Clothing or Shelter Needs: No; Support System Lacking: No; Transportation Concerns: No Electronic Signature(s) Signed: 05/29/2022 4:19:05 PM By: Fredirick Maudlin MD FACS Signed: 05/29/2022 4:29:03 PM By: Baruch Gouty RN, BSN Entered By: Fredirick Maudlin on 05/29/2022 16:12:55 Melford Aase ([id number]) [id number] Page 10 of 10 -------------------------------------------------------------------------------- SuperBill Details Patient Name: Date of Service: [name], [place] RO Chavez 05/29/2022 Medical Record Number: [id number] Patient Account Number: [id number] Date of Birth/Sex: Treating RN: 1961-04-23 (61 y.o. 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 P94.70 Chronic kidney disease, stage 3 unspecified L97.822 Non-pressure chronic ulcer of other part of left lower leg with fat layer exposed Facility Procedures : CPT4 Code: [id number] Description: [id number] - DEBRIDE WOUND 1ST 20 SQ CM OR < ICD-10 Diagnosis Description L97.822 Non-pressure chronic ulcer of other part of left  lower leg with fat layer expose Modifier: d Quantity: 1 Physician Procedures : CPT4 Code Description Modifier [id number] - WC PHYS LEVEL 4 - EST PT 25 ICD-10 Diagnosis Description L97.822 Non-pressure chronic ulcer of other part of left lower leg with fat layer exposed I83.892 Varicose veins of left lower extremity with other  complications K81.38 Chronic kidney disease, stage 3 unspecified Quantity: 1 : [id number] - WC PHYS DEBR WO ANESTH 20 SQ CM ICD-10 Diagnosis Description L97.822 Non-pressure chronic ulcer of other part of left lower leg with fat layer exposed Quantity: 1 Electronic Signature(s) Signed: 05/29/2022 4:18:26 PM By: Fredirick Maudlin MD FACS Entered By: Fredirick Maudlin on 05/29/2022 16:18:26

## 2022-06-06 ENCOUNTER — Encounter (HOSPITAL_BASED_OUTPATIENT_CLINIC_OR_DEPARTMENT_OTHER): Payer: Medicare Other | Admitting: General Surgery

## 2022-06-06 DIAGNOSIS — Z09 Encounter for follow-up examination after completed treatment for conditions other than malignant neoplasm: Secondary | ICD-10-CM | POA: Diagnosis not present

## 2022-06-06 DIAGNOSIS — I83028 Varicose veins of left lower extremity with ulcer other part of lower leg: Secondary | ICD-10-CM | POA: Diagnosis not present

## 2022-06-06 DIAGNOSIS — I872 Venous insufficiency (chronic) (peripheral): Secondary | ICD-10-CM | POA: Diagnosis not present

## 2022-06-06 DIAGNOSIS — L97822 Non-pressure chronic ulcer of other part of left lower leg with fat layer exposed: Secondary | ICD-10-CM | POA: Diagnosis not present

## 2022-06-07 NOTE — Progress Notes (Signed)
EMNET, MONK ([id number]) [id number] Page 1 of 8 Visit Report for 06/06/2022 Arrival Information Details Patient Name: Date of Service: [name] Chavez, [place] RO Chavez 06/06/2022 2:45 PM Medical Record Number: [id number] Patient Account Number: [id number] Date of Birth/Sex: Treating RN: 03-11-1961 (61 y.o. [name] Primary Care Hadyn Azer: [name] Other Clinician: Referring Barbette Mcglaun: Treating Izan Miron/Extender: [name] in Treatment: 2 Visit Information History Since Last Visit Added or deleted any medications: No Patient Arrived: Ambulatory Any new allergies or adverse reactions: No Arrival Time: 14:57 Had a fall or experienced change in No Accompanied By: neighbor activities of daily living that may affect Transfer Assistance: None risk of falls: Patient Identification Verified: Yes Signs or symptoms of abuse/neglect since last visito No Patient Requires Transmission-Based Precautions: No Hospitalized since last visit: No Patient Has Alerts: No Implantable device outside of the clinic excluding No cellular tissue based products placed in the center since last visit: Has Dressing in Place as Prescribed: Yes Has Compression in Place as Prescribed: Yes Pain Present Now: Yes Electronic Signature(s) Signed: 06/06/2022 4:13:14 PM By: Dellie Catholic RN Entered By: Dellie Catholic on 06/06/2022 15:11:13 -------------------------------------------------------------------------------- Compression Therapy Details Patient Name: Date of Service: [name], [name] 06/06/2022 2:45 PM Medical Record Number: [id number] Patient Account Number: [id number] Date of Birth/Sex: Treating RN: June 27, 1961 (61 y.o. [name] Primary Care Erin Obando: [name] Other Clinician: Referring Ahyan Kreeger: Treating Wilber Fini/Extender: Burley Saver Weeks in Treatment: 2 Compression Therapy Performed for Wound  Assessment: Wound #21 Left,Lateral Lower Leg Performed By: Clinician Baruch Gouty, RN Compression Type: Four Layer Post Procedure Diagnosis Same as Pre-procedure Electronic Signature(s) Signed: 06/06/2022 5:34:41 PM By: Baruch Gouty RN, BSN Entered By: Baruch Gouty on 06/06/2022 15:26:33 [name] ([id number]) [id number] Page 2 of 8 -------------------------------------------------------------------------------- Encounter Discharge Information Details Patient Name: Date of Service: [name] Chavez, [place] RO Chavez 06/06/2022 2:45 PM Medical Record Number: [id number] Patient Account Number: [id number] Date of Birth/Sex: Treating RN: 08-17-1961 (61 y.o. [name] Primary Care Azell Bill: [name] Other Clinician: Referring Kevia Zaucha: Treating Brennen Camper/Extender: [name] in Treatment: 2 Encounter Discharge Information Items Post Procedure Vitals Discharge Condition: Stable Temperature (F): 99.2 Ambulatory Status: Ambulatory Pulse (bpm): 78 Discharge Destination: Home Respiratory Rate (breaths/min): 18 Transportation: Private Auto Blood Pressure (mmHg): 188/76 Accompanied By: self Schedule Follow-up Appointment: Yes Clinical Summary of Care: Patient Declined Electronic Signature(s) Signed: 06/06/2022 5:34:41 PM By: Baruch Gouty RN, BSN Entered By: Baruch Gouty on 06/06/2022 15:42:41 -------------------------------------------------------------------------------- Lower Extremity Assessment Details Patient Name: Date of Service: [name], [name] 06/06/2022 2:45 PM Medical Record Number: [id number] Patient Account Number: [id number] Date of Birth/Sex: Treating RN: 02/19/1961 (61 y.o. [name] Primary Care Tangie Stay: [name] Other Clinician: Referring Ismaeel Arvelo: Treating Saryiah Bencosme/Extender: Burley Saver Weeks in Treatment: 2 Edema Assessment Assessed: [Left: No] [Right:  No] Edema: [Left: Ye] [Right: s] Calf Left: Right: Point of Measurement: 40 cm From Medial Instep 38.8 cm Ankle Left: Right: Point of Measurement: 10 cm From Medial Instep 25 cm Vascular Assessment Pulses: Dorsalis Pedis Palpable: [Left:Yes] Electronic Signature(s) Signed: 06/06/2022 4:13:14 PM By: Dellie Catholic RN Entered By: Dellie Catholic on 06/06/2022 15:15:25 Multi Wound Chart Details -------------------------------------------------------------------------------- [name] ([id number]) [id number] Page 3 of 8 Patient Name: Date of Service: [name] Chavez, [place] RO Chavez 06/06/2022 2:45 PM Medical Record Number: [id number] Patient Account Number: [id number] Date of Birth/Sex: Treating RN: [birth year] (61 y.o. F) Primary Care Girl Schissler: [name] Other Clinician: Referring Johnni Wunschel: Treating Kamoni Gentles/Extender: Fredirick Maudlin  [name] Weeks in Treatment: 2 Vital Signs Height(in): 68 Pulse(bpm): 78 Weight(lbs): 193 Blood Pressure(mmHg): 188/86 Body Mass Index(BMI): 29.3 Temperature(F): 99.2 Respiratory Rate(breaths/min): 18 Wound Assessments Wound Number: 21 N/A N/A Photos: N/A N/A Left, Lateral Lower Leg N/A N/A Wound Location: Gradually Appeared N/A N/A Wounding Event: Venous Leg Ulcer N/A N/A Primary Etiology: Angina, Congestive Heart Failure, N/A N/A Comorbid History: Hypertension, Peripheral Venous Disease 04/28/2022 N/A N/A Date Acquired: 2 N/A N/A Weeks of Treatment: Open N/A N/A Wound Status: No N/A N/A Wound Recurrence: 0.3x0.2x0.1 N/A N/A Measurements L x W x D (cm) 0.047 N/A N/A A (cm) : rea 0.005 N/A N/A Volume (cm) : 99.80% N/A N/A % Reduction in A rea: 99.80% N/A N/A % Reduction in Volume: Full Thickness With Exposed Support N/A N/A Classification: Structures Medium N/A N/A Exudate A mount: Serosanguineous N/A N/A Exudate Type: red, Chavez N/A N/A Exudate Color: Distinct, outline attached  N/A N/A Wound Margin: Small (1-33%) N/A N/A Granulation A mount: Pink N/A N/A Granulation Quality: Large (67-100%) N/A N/A Necrotic A mount: Eschar N/A N/A Necrotic Tissue: Fascia: No N/A N/A Exposed Structures: Fat Layer (Subcutaneous Tissue): No Tendon: No Muscle: No Joint: No Bone: No Medium (34-66%) N/A N/A Epithelialization: Debridement - Selective/Open Wound N/A N/A Debridement: Pre-procedure Verification/Time Out 15:20 N/A N/A Taken: Lidocaine 4% Topical Solution N/A N/A Pain Control: Necrotic/Eschar N/A N/A Tissue Debrided: Non-Viable Tissue N/A N/A Level: 2 N/A N/A Debridement A (sq cm): rea Curette N/A N/A Instrument: Minimum N/A N/A Bleeding: Pressure N/A N/A Hemostasis A chieved: 3 N/A N/A Procedural Pain: 1 N/A N/A Post Procedural Pain: Procedure was tolerated well N/A N/A Debridement Treatment Response: 0.2x0.2x0.1 N/A N/A Post Debridement Measurements L x W x D (cm) 0.003 N/A N/A Post Debridement Volume: (cm) Scarring: Yes N/A N/A Periwound Skin Texture: Dry/Scaly: Yes N/A N/A Periwound Skin Moisture: Hemosiderin Staining: Yes N/A N/A Periwound Skin Color: No Abnormality N/A N/A Temperature: Yes N/A N/A Tenderness on Palpation: Debridement N/A N/A Procedures Performed: LASHANNON, BRESNAN ([id number]) [id number] Page 4 of 8 Treatment Notes Electronic Signature(s) Signed: 06/06/2022 3:26:02 PM By: Fredirick Maudlin MD FACS Entered By: Fredirick Maudlin on 06/06/2022 15:26:02 -------------------------------------------------------------------------------- Multi-Disciplinary Care Plan Details Patient Name: Date of Service: [name], [name] 06/06/2022 2:45 PM Medical Record Number: [id number] Patient Account Number: [id number] Date of Birth/Sex: Treating RN: 1961-02-03 (61 y.o. [name] Primary Care Haruo Stepanek: [name] Other Clinician: Referring Harlow Carrizales: Treating Jamara Vary/Extender: [name] in Treatment: 2 Multidisciplinary Care Plan reviewed with physician Active Inactive Venous Leg Ulcer Nursing Diagnoses: Actual venous Insuffiency (use after diagnosis is confirmed) Knowledge deficit related to disease process and management Goals: Patient will maintain optimal edema control Date Initiated: 05/19/2022 Target Resolution Date: 06/16/2022 Goal Status: Active Patient/caregiver will verbalize understanding of disease process and disease management Date Initiated: 05/19/2022 Target Resolution Date: 06/16/2022 Goal Status: Active Interventions: Assess peripheral edema status every visit. Compression as ordered Provide education on venous insufficiency Treatment Activities: Therapeutic compression applied : 05/19/2022 Notes: Wound/Skin Impairment Nursing Diagnoses: Impaired tissue integrity Knowledge deficit related to ulceration/compromised skin integrity Goals: Patient/caregiver will verbalize understanding of skin care regimen Date Initiated: 05/19/2022 Target Resolution Date: 06/16/2022 Goal Status: Active Ulcer/skin breakdown will have a volume reduction of 30% by week 4 Date Initiated: 05/19/2022 Target Resolution Date: 05/19/2022 Goal Status: Active Interventions: Assess patient/caregiver ability to obtain necessary supplies Assess patient/caregiver ability to perform ulcer/skin care regimen upon admission and as needed Assess ulceration(s) every visit Provide education on ulcer and  skin care Treatment Activities: Skin care regimen initiated : 05/19/2022 Topical wound management initiated : 05/19/2022 [name], [name] ([id number]) [id number] Page 5 of 8 Notes: Electronic Signature(s) Signed: 06/06/2022 5:34:41 PM By: Baruch Gouty RN, BSN Entered By: Baruch Gouty on 06/06/2022 15:41:10 -------------------------------------------------------------------------------- Pain Assessment Details Patient  Name: Date of Service: [name], [name] 06/06/2022 2:45 PM Medical Record Number: [id number] Patient Account Number: [id number] Date of Birth/Sex: Treating RN: Apr 06, 1961 (61 y.o. [name] Primary Care Jaythan Hinely: [name] Other Clinician: Referring Tilia Faso: Treating Thersea Manfredonia/Extender: Burley Saver Weeks in Treatment: 2 Active Problems Location of Pain Severity and Description of Pain Patient Has Paino Yes Site Locations Pain Location: Generalized Pain With Dressing Change: Yes Duration of the Pain. Constant / Intermittento Constant Rate the pain. Current Pain Level: 9 Worst Pain Level: 10 Least Pain Level: 3 Tolerable Pain Level: 3 Character of Pain Describe the Pain: Difficult to Pinpoint Pain Management and Medication Current Pain Management: Medication: Yes Cold Application: No Rest: Yes Massage: No Activity: No T.E.N.S.: No Heat Application: No Leg drop or elevation: No Is the Current Pain Management Adequate: Adequate How does your wound impact your activities of daily livingo Sleep: No Bathing: No Appetite: No Relationship With Others: No Bladder Continence: No Emotions: No Bowel Continence: No Work: No Toileting: No Drive: No Dressing: No Hobbies: No Electronic Signature(s) Signed: 06/06/2022 4:13:14 PM By: Dellie Catholic RN Entered By: Dellie Catholic on 06/06/2022 15:13:52 [name] ([id number]) [id number] Page 6 of 8 -------------------------------------------------------------------------------- Patient/Caregiver Education Details Patient Name: Date of Service: [name], [place] RO Chavez 10/17/2023andnbsp2:45 PM Medical Record Number: [id number] Patient Account Number: [id number] Date of Birth/Gender: Treating RN: March 05, 1961 (61 y.o. [name] Primary Care Physician: [name] Other Clinician: Referring Physician: Treating Physician/Extender: [name] in Treatment: 2 Education Assessment Education Provided To: Patient Education Topics Provided Venous: Methods: Explain/Verbal Responses: Reinforcements needed, State content correctly Electronic Signature(s) Signed: 06/06/2022 5:34:41 PM By: Baruch Gouty RN, BSN Entered By: Baruch Gouty on 06/06/2022 15:41:29 -------------------------------------------------------------------------------- Wound Assessment Details Patient Name: Date of Service: [name], [name] 06/06/2022 2:45 PM Medical Record Number: [id number] Patient Account Number: [id number] Date of Birth/Sex: Treating RN: 20-Sep-1960 (61 y.o. [name] Primary Care Lyah Millirons: [name] Other Clinician: Referring Sedrick Tober: Treating Cranford Blessinger/Extender: Burley Saver Weeks in Treatment: 2 Wound Status Wound Number: 21 Primary Venous Leg Ulcer Etiology: Wound Location: Left, Lateral Lower Leg Wound Status: Open Wounding Event: Gradually Appeared Comorbid Angina, Congestive Heart Failure, Hypertension, Peripheral Date Acquired: 04/28/2022 History: Venous Disease Weeks Of Treatment: 2 Clustered Wound: No Photos Wound Measurements Length: (cm) 0.3 Width: (cm) 0.2 [name], [name] ([id number]) Depth: (cm) Area: (cm) Volume: (cm) % Reduction in Area: 99.8% % Reduction in Volume: 99.8% [id number] Page 7 of 8 0.1 Epithelialization: Medium (34-66%) 0.047 Tunneling: No 0.005 Undermining: No Wound Description Classification: Full Thickness With Exposed Suppo Wound Margin: Distinct, outline attached Exudate Amount: Medium Exudate Type: Serosanguineous Exudate Color: red, Chavez rt Structures Foul Odor After Cleansing: No Slough/Fibrino Yes Wound Bed Granulation Amount: Small (1-33%) Exposed Structure Granulation Quality: Pink Fascia Exposed: No Necrotic Amount: Large (67-100%) Fat Layer (Subcutaneous Tissue) Exposed: No Necrotic Quality:  Eschar Tendon Exposed: No Muscle Exposed: No Joint Exposed: No Bone Exposed: No Periwound Skin Texture Texture Color No Abnormalities Noted: No No Abnormalities Noted: No Scarring: Yes Hemosiderin Staining: Yes Moisture Temperature / Pain No Abnormalities Noted: No Temperature: No Abnormality Dry / Scaly: Yes Tenderness on Palpation: Yes Treatment Notes  Wound #21 (Lower Leg) Wound Laterality: Left, Lateral Cleanser Peri-Wound Care Sween Lotion (Moisturizing lotion) Discharge Instruction: Apply moisturizing lotion as directed Topical Primary Dressing KerraCel Ag Gelling Fiber Dressing, 4x5 in (silver alginate) Discharge Instruction: Apply silver alginate to wound bed as instructed Secondary Dressing Woven Gauze Sponge, Non-Sterile 4x4 in Discharge Instruction: Apply over primary dressing as directed. Secured With Compression Wrap FourPress (4 layer compression wrap) Discharge Instruction: Apply four layer compression as directed. May also use Miliken CoFlex 2 layer compression system as alternative. Compression Stockings Add-Ons Electronic Signature(s) Signed: 06/06/2022 4:13:14 PM By: Dellie Catholic RN Entered By: Dellie Catholic on 06/06/2022 15:17:42 -------------------------------------------------------------------------------- Vitals Details Patient Name: Date of Service: [name], [name] 06/06/2022 2:45 PM Medical Record Number: [id number] Patient Account Number: [id number] Date of Birth/Sex: Treating RN: 02-28-1961 (61 y.o. [address], [place], [place] ([id number]) [id number] Page 8 of 8 Primary Care Savonna Birchmeier: [name] Other Clinician: Referring Alonie Gazzola: Treating Anyssa Sharpless/Extender: [name] in Treatment: 2 Vital Signs Time Taken: 14:58 Temperature (F): 99.2 Height (in): 68 Pulse (bpm): 78 Weight (lbs): 193 Respiratory Rate (breaths/min): 18 Body Mass Index (BMI): 29.3 Blood  Pressure (mmHg): 188/86 Reference Range: 80 - 120 mg / dl Electronic Signature(s) Signed: 06/06/2022 4:13:14 PM By: Dellie Catholic RN Entered By: Dellie Catholic on 06/06/2022 15:15:53

## 2022-06-07 NOTE — Progress Notes (Signed)
[name], [name] ([id number]) [id number] Page 1 of 10 Visit Report for 06/06/2022 Chief Complaint Document Details Patient Name: Date of Service: [name] Chavez, [place] [name] 06/06/2022 2:45 PM Medical Record Number: [id number] Patient Account Number: [id number] Date of Birth/Sex: Treating RN: 27-May-1961 (61 y.o. F) Primary Care Provider: Minette Chavez Other Clinician: Referring Provider: Treating Provider/Extender: [name] in Treatment: 2 Information Obtained from: Patient Chief Complaint Patient presents for treatment of an open ulcer due to venous insufficiency. she has had a recurrent ulceration of the left lower extremity for about 3 weeks 05/19/2022: The patient returns after a 5-year absence with a recurrent ulceration on the left lower extremity at the same site as previous wounds Electronic Signature(s) Signed: 06/06/2022 3:26:41 PM By: Summer Maudlin MD FACS Entered By: [name] on 06/06/2022 15:26:41 -------------------------------------------------------------------------------- Debridement Details Patient Name: Date of Service: [name], [place] [name] 06/06/2022 2:45 PM Medical Record Number: [id number] Patient Account Number: [id number] Date of Birth/Sex: Treating RN: Nov 18, 1960 (61 y.o. [name] Primary Care Provider: Minette Chavez Other Clinician: Referring Provider: Treating Provider/Extender: Burley Saver Weeks in Treatment: 2 Debridement Performed for Assessment: Wound #21 Left,Lateral Lower Leg Performed By: Physician Summer Maudlin, MD Debridement Type: Debridement Severity of Tissue Pre Debridement: Fat layer exposed Level of Consciousness (Pre-procedure): Awake and Alert Pre-procedure Verification/Time Out Yes - 15:20 Taken: Start Time: 15:20 Pain Control: Lidocaine 4% Topical Solution T Area Debrided (L x W): otal 2 (cm) x 1 (cm) = 2 (cm) Tissue and other material  debrided: Non-Viable, Eschar Level: Non-Viable Tissue Debridement Description: Selective/Open Wound Instrument: Curette Bleeding: Minimum Hemostasis Achieved: Pressure Procedural Pain: 3 Post Procedural Pain: 1 Response to Treatment: Procedure was tolerated well Level of Consciousness (Post- Awake and Alert procedure): Post Debridement Measurements of Total Wound Length: (cm) 0.2 Width: (cm) 0.2 Depth: (cm) 0.1 Volume: (cm) 0.003 Character of Wound/Ulcer Post Debridement: Improved [name], [name] ([id number]) [id number] Page 2 of 10 Severity of Tissue Post Debridement: Fat layer exposed Post Procedure Diagnosis Same as Pre-procedure Notes scribed by Summer Gouty, RN for Dr. Celine Chavez Electronic Signature(s) Signed: 06/06/2022 5:34:41 PM By: Summer Gouty RN, BSN Signed: 06/07/2022 7:41:14 AM By: Summer Maudlin MD FACS Previous Signature: 06/06/2022 3:51:29 PM Version By: Summer Maudlin MD FACS Entered By: [name] on 06/06/2022 16:17:38 -------------------------------------------------------------------------------- HPI Details Patient Name: Date of Service: [name], [place] [name] 06/06/2022 2:45 PM Medical Record Number: [id number] Patient Account Number: [id number] Date of Birth/Sex: Treating RN: 1961-01-24 (61 y.o. F) Primary Care Provider: Minette Chavez Other Clinician: Referring Provider: Treating Provider/Extender: [name] in Treatment: 2 History of Present Illness Location: Ulcer on the left lower extremity Quality: Patient reports experiencing a sharp pain to affected area(s). Severity: Presents with a pain of ____8_on a pain scale of 1-10. Duration: Patient has had the wound for < 2 weeks prior to presenting for treatment Timing: Pain in wound is constant (hurts all the time) Context: The wound would happen gradually Modifying Factors: Patient wound(s)/ulcer(s) are improving due [id number] her  compression stockings regularly ssociated Signs and Symptoms: Patient reports presence of swelling A HPI Description: 61 year old patient who is known to have significant hypertension, venous stasis ulcer, diastolic heart failure, hypokalemia, CHF, chronic bronchitis and nicotine addiction has been seen in the wound center previously and now comes back with a left lower extremity ulceration. She says she's been wearing her stockings regularly but I believe she's been noncompliant from the description of when she wears it and how  often she wears it. She also sees her PCP on a regular basis but I believe her blood pressure has been running very high and her diastolic has been as high as 130. Her systolic normally runs in the low 200s. She has had her last venous study done in 2014 and this was a DVT study. She has not had any endovenous ablation. 06/09/2015 -- her arterial studies revealed that her right ABI was 1.14 and the left ABI was 1.18. Her venous duplex study revealed there was no evidence of DVT or SVT There was deep vein reflux in the left common femoral vein. There was also reflux . noted in the left saphenofemoral junction and proximal to the mid great saphenous vein. In view of this I am going to recommend a vascular surgery opinion to see what procedure would best serve her. 06/16/2015 -- her vascular opinion is on November 8 until then we will continue with local care. 06/23/2015 -- she continues to smoke about 4 cigarettes a day and other than that has been very compliant with her management. 06/30/2015 -- she saw Dr. Kellie Chavez recently who recommended: [name] is a 61 y.o. female who presents with: bilateral lower extremity chronic venous insufficiency (C6). The patient has a history of bilateral lower extremity venous stasis ulceration. Her current left leg wound is healing with unna boot application. All of her previous venous stasis ulcers have healed within 4 weeks. Her  great saphenous veins bilaterally on sonosite exam were not markedly dilated. Since her current left leg wound is healing, will have her follow up in 3 months for further evaluation. She has adequate arterial circulation. She will continue to go to the wound center for unna boots. Recommended use of compression stocks for swelling management once her left leg wound hass healed. Advised her to also wear compression on her right leg. 07/14/2015 -- she does not have her stockings with her today but she is going right from here to the store to buy this compression stockings. He still continues to smoke couple of cigarettes a day 10/07/15; the patient returns today with a recurrent inflamed ulceration on the lateral left leg. We know her from previous recurrent ulcerations on her legs largely related to venous insufficiency. Previous arterial evaluation in October 2016 showed ABI of 1.14 on the right 1.18 on the left with triphasic waves in both the posterior tibial and dorsalis pedis. She does not have a significant arterial issue. She has been followed by [facility] She has deep vein reflux noted in the left common femoral vein and reflux noted in the left saphenofemoral junction and proximal to mid great saphenous vein although his last note seems to suggest that no interventions at this time. In the meantime she is back here with a significant inflamed macerated area on her left lateral leg which is open already but certainly is a threatening larger ulceration. 10/14/15; the area looks a lot better than last week. I was concerned about a large impending ulceration or multiple small ulcerations however with edema control, and perhaps the doxycycline things actually look a lot better. Probably mostly edema control. We have been using silver alginate and Profore 10/21/15; only one dime-sized open area remains. This required surgical debridement. There is no evidence rounding infection  she is completing her doxycycline. Edema control appears adequate. Still severe surrounding venous stasis and inflammation 10/28/15; wound is continuing to contractor. No debridement was done. There is no evidence of surrounding  infection. She has chronic venous insufficiency with inflammation however her edema is well controlled no other issues were seen 11/05/15 wound continues to contractor. Surgical debridement done of nonviable's edges surface slough. This continues to look as though it is contracting Round, Zarra ([id number]) [id number] Page 3 of 10 11/11/15 wound continues to contract and epithelialize. No debridement was necessary. This looks as though it is progressing towards closure next week 11/18/15 wound is healed. READMISSION 06/08/16; patient we know previously who has chronic venous insufficiency with inflammation and recurrent lower extremity ulceration. She has been in this clinic at least 2 prior occasions most recently earlier this year with a wound on her lateral left leg. We discharge during graded pressure stockings. She is not a diabetic. She has not have known PAD. She states is slightly over a month ago she developed increasing swelling in the right leg and an open wound on the lateral aspect of her right calf which is been getting worse over the last 3 weeks she's trying to Dr. this herself with topical antibiotics this is not work she is back in our clinic for review of that 06/15/16; still a nonviable surface that requires debridement still using Santyl 06/22/16; the patient has a new wound on her right lateral foot of uncertain etiology there is some tenderness around this area 06/29/16; the area on the right lateral leg appears to be better. His area on the right lateral foot is no better 07/06/16; she has a healthy wound on the right lateral leg however this is not appear to be smaller than last week 3.1 x 2.7 x 0.2. Her area on the right  lateral foot really looks about the same 07/20/16; some improvement are noted in both wounds on the right lateral calf also on the right lateral foot. Improvement in dimensions. 08/03/16; left her wrap on for 2 weeks. However both wound areas look improved the right lateral calf in terms of dimensions and also the right lateral foot. Still debridement in the right lateral Calf 08/17/16; the area on the right lateral foot has healed. The area on the right lateral calf improved again she leaves her wrap on for 2 weeks am not sure why this is. 08/31/16; the area on the right lateral foot remains healed. The area on the right lateral calf again is a very tiny wound area that should be healed next week 0.6 x 0.5 x 0.1 09/21/16; patient returns after a three-week hiatus I think with the same dressing on. As predicted 3 weeks ago the wound is actually healed. She has 15 mm TED hose. READMISSION 05/19/2022 This is a now 61 year old woman with a past medical history of venous insufficiency, CKD stage III, hypertension secondary to hyperaldosteronism, and previous venous ulcers on her legs. 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 The wound is on her left lateral lower leg. There is a lot of crusted eschar and slough present. She says the wound is very tender. 05/29/2022: The wound is markedly improved this week. All of the crusting and eschar is gone and there has been substantial epithelialization. There is just a little bit of slough present with good granulation tissue. 06/06/2022: The wound has nearly completely epithelialized. There is some eschar on the surface, underneath which there are 2 small open areas. No concern for infection and her pain has improved significantly. Electronic Signature(s) Signed: 06/06/2022 3:27:15 PM By: Summer Maudlin MD FACS  Entered By: [name] on 06/06/2022  15:27:14 -------------------------------------------------------------------------------- Physical Exam Details Patient Name: Date of Service: [name] [place]avez, [place] [name] 06/06/2022 2:45 PM Medical Record Number: [id number] Patient Account Number: [id number] Date of Birth/Sex: Treating RN: 1960-12-02 (61 y.o. F) Primary Care Provider: Minette Chavez Other Clinician: Referring Provider: Treating Provider/Extender: Burley Saver Weeks in Treatment: 2 Constitutional Hypertensive, asymptomatic. . . . No acute distress.Marland Kitchen Respiratory Normal work of breathing on room air.. Notes 06/06/2022: The wound has nearly completely epithelialized. There is some eschar on the surface, underneath which there are 2 small open areas. No concern for infection and her pain has improved significantly. Electronic Signature(s) Signed: 06/06/2022 3:37:50 PM By: Summer Maudlin MD FACS Entered By: [name] on 06/06/2022 15:37:50 [name] ([id number]) [id number] Page 4 of 10 -------------------------------------------------------------------------------- Physician Orders Details Patient Name: Date of Service: [name] [place]avez, [place] [name] 06/06/2022 2:45 PM Medical Record Number: [id number] Patient Account Number: [id number] Date of Birth/Sex: Treating RN: [birth year] (61 y.o. [name], [name] Primary Care Provider: Minette Chavez Other Clinician: Referring Provider: Treating Provider/Extender: [name] in Treatment: 2 Verbal / Phone Orders: No Diagnosis Coding ICD-10 Coding Code Description [phone] Varicose veins of left lower extremity with other complications J09.32 Chronic kidney disease, stage 3 unspecified L97.822 Non-pressure chronic ulcer of other part of left lower leg with fat layer exposed Follow-up Appointments ppointment in 1 week. - Dr. Celine Chavez RM 1 Return A Anesthetic Wound #21 Left,Lateral Lower Leg (In clinic)  Topical Lidocaine 4% applied to wound bed Bathing/ Shower/ Hygiene May shower with protection but do not get wound dressing(s) wet. Edema Control - Lymphedema / SCD / Other Elevate legs to the level of the heart or above for 30 minutes daily and/or when sitting, a frequency of: Avoid standing for long periods of time. Patient to wear own compression stockings every day. - right leg daily Exercise regularly Wound Treatment Wound #21 - Lower Leg Wound Laterality: Left, Lateral Peri-Wound Care: Sween Lotion (Moisturizing lotion) 1 x Per Week/30 Days Discharge Instructions: Apply moisturizing lotion as directed Prim Dressing: KerraCel Ag Gelling Fiber Dressing, 4x5 in (silver alginate) 1 x Per Week/30 Days ary Discharge Instructions: Apply silver alginate to wound bed as instructed Secondary Dressing: Woven Gauze Sponge, Non-Sterile 4x4 in 1 x Per Week/30 Days Discharge Instructions: Apply over primary dressing as directed. Compression Wrap: FourPress (4 layer compression wrap) 1 x Per Week/30 Days Discharge Instructions: Apply four layer compression as directed. May also use Miliken CoFlex 2 layer compression system as alternative. Electronic Signature(s) Signed: 06/06/2022 3:51:29 PM By: Summer Maudlin MD FACS Entered By: [name] on 06/06/2022 15:38:17 -------------------------------------------------------------------------------- Problem List Details Patient Name: Date of Service: [name], [place] [name] 06/06/2022 2:45 PM Medical Record Number: [id number] Patient Account Number: [id number] Date of Birth/Sex: Treating RN: 01-22-1961 (61 y.o. F) Primary Care Provider: Minette Chavez Other Clinician: Referring Provider: Treating Provider/Extender: [name] in Treatment: 2 Active Problems Clayton, Hoyle Sauer ([id number]) [id number] Page 5 of 10 ICD-10 Encounter Code Description Active Date MDM Diagnosis I83.892 Varicose  veins of left lower extremity with other complications 3/97/6734 No Yes N18.30 Chronic kidney disease, stage 3 unspecified 05/19/2022 No Yes L97.822 Non-pressure chronic ulcer of other part of left lower leg with fat layer exposed9/29/2023 No Yes Inactive Problems Resolved Problems Electronic Signature(s) Signed: 06/06/2022 3:25:58 PM By: Summer Maudlin MD FACS Entered By: [name] on 06/06/2022 15:25:58 -------------------------------------------------------------------------------- Progress Note Details Patient Name: Date of Service: [name], [place] Summer  Chavez 06/06/2022 2:45 PM Medical Record Number: [id number] Patient Account Number: [id number] Date of Birth/Sex: Treating RN: 12/07/1960 (61 y.o. F) Primary Care Provider: Minette Chavez Other Clinician: Referring Provider: Treating Provider/Extender: [name] in Treatment: 2 Subjective Chief Complaint Information obtained from Patient Patient presents for treatment of an open ulcer due to venous insufficiency. she has had a recurrent ulceration of the left lower extremity for about 3 weeks 05/19/2022: The patient returns after a 5-year absence with a recurrent ulceration on the left lower extremity at the same site as previous wounds History of Present Illness (HPI) The following HPI elements were documented for the patient's wound: Location: Ulcer on the left lower extremity Quality: Patient reports experiencing a sharp pain to affected area(s). Severity: Presents with a pain of ____8_on a pain scale of 1-10. Duration: Patient has had the wound for < 2 weeks prior to presenting for treatment Timing: Pain in wound is constant (hurts all the time) Context: The wound would happen gradually Modifying Factors: Patient wound(s)/ulcer(s) are improving due [id number] her compression stockings regularly Associated Signs and Symptoms: Patient reports presence of swelling 61 year old patient who is known to have  significant hypertension, venous stasis ulcer, diastolic heart failure, hypokalemia, CHF, chronic bronchitis and nicotine addiction has been seen in the wound center previously and now comes back with a left lower extremity ulceration. She says she's been wearing her stockings regularly but I believe she's been noncompliant from the description of when she wears it and how often she wears it. She also sees her PCP on a regular basis but I believe her blood pressure has been running very high and her diastolic has been as high as 130. Her systolic normally runs in the low 200s. She has had her last venous study done in 2014 and this was a DVT study. She has not had any endovenous ablation. 06/09/2015 -- her arterial studies revealed that her right ABI was 1.14 and the left ABI was 1.18. Her venous duplex study revealed there was no evidence of DVT or SVT There was deep vein reflux in the left common femoral vein. There was also reflux . noted in the left saphenofemoral junction and proximal to the mid great saphenous vein. In view of this I am going to recommend a vascular surgery opinion to see what procedure would best serve her. 06/16/2015 -- her vascular opinion is on November 8 until then we will continue with local care. 06/23/2015 -- she continues to smoke about 4 cigarettes a day and other than that has been very compliant with her management. 06/30/2015 -- she saw Dr. Kellie Chavez recently who recommended: [name] is a 61 y.o. female who presents with: bilateral lower extremity chronic venous insufficiency (C6).  The patient has a history of bilateral lower extremity venous stasis ulceration. Her current left leg wound is healing with unna boot application. All of her previous venous stasis ulcers have healed within 4 weeks.  Her great saphenous veins bilaterally on sonosite exam were not markedly dilated. Since her current left leg wound is healing, will have her follow up in  3 months for further evaluation.  She has adequate arterial circulation. [name], [name] ([id number]) [id number] Page 6 of 10  She will continue to go to the wound center for unna boots.  Recommended use of compression stocks for swelling management once her left leg wound hass healed. Advised her to also wear compression on her right leg. 07/14/2015 -- she does not have her stockings  with her today but she is going right from here to the store to buy this compression stockings. He still continues to smoke couple of cigarettes a day 10/07/15; the patient returns today with a recurrent inflamed ulceration on the lateral left leg. We know her from previous recurrent ulcerations on her legs largely related to venous insufficiency. Previous arterial evaluation in October 2016 showed ABI of 1.14 on the right 1.18 on the left with triphasic waves in both the posterior tibial and dorsalis pedis. She does not have a significant arterial issue. She has been followed by Dr. [facility]. She has deep vein reflux noted in the left common femoral vein and reflux noted in the left saphenofemoral junction and proximal to mid great saphenous vein although his last note seems to suggest that no interventions at this time. In the meantime she is back here with a significant inflamed macerated area on her left lateral leg which is open already but certainly is a threatening larger ulceration. 10/14/15; the area looks a lot better than last week. I was concerned about a large impending ulceration or multiple small ulcerations however with edema control, and perhaps the doxycycline things actually look a lot better. Probably mostly edema control. We have been using silver alginate and Profore 10/21/15; only one dime-sized open area remains. This required surgical debridement. There is no evidence rounding infection she is completing her doxycycline. Edema control appears  adequate. Still severe surrounding venous stasis and inflammation 10/28/15; wound is continuing to contractor. No debridement was done. There is no evidence of surrounding infection. She has chronic venous insufficiency with inflammation however her edema is well controlled no other issues were seen 11/05/15 wound continues to contractor. Surgical debridement done of nonviable's edges surface slough. This continues to look as though it is contracting 11/11/15 wound continues to contract and epithelialize. No debridement was necessary. This looks as though it is progressing towards closure next week 11/18/15 wound is healed. READMISSION 06/08/16; patient we know previously who has chronic venous insufficiency with inflammation and recurrent lower extremity ulceration. She has been in this clinic at least 2 prior occasions most recently earlier this year with a wound on her lateral left leg. We discharge during graded pressure stockings. She is not a diabetic. She has not have known PAD. She states is slightly over a month ago she developed increasing swelling in the right leg and an open wound on the lateral aspect of her right calf which is been getting worse over the last 3 weeks she's trying to Dr. this herself with topical antibiotics this is not work she is back in our clinic for review of that 06/15/16; still a nonviable surface that requires debridement still using Santyl 06/22/16; the patient has a new wound on her right lateral foot of uncertain etiology there is some tenderness around this area 06/29/16; the area on the right lateral leg appears to be better. His area on the right lateral foot is no better 07/06/16; she has a healthy wound on the right lateral leg however this is not appear to be smaller than last week 3.1 x 2.7 x 0.2. Her area on the right lateral foot really looks about the same 07/20/16; some improvement are noted in both wounds on the right lateral calf also on the right  lateral foot. Improvement in dimensions. 08/03/16; left her wrap on for 2 weeks. However both wound areas look improved the right lateral calf in terms of dimensions and also  the right lateral foot. Still debridement in the right lateral Calf 08/17/16; the area on the right lateral foot has healed. The area on the right lateral calf improved again she leaves her wrap on for 2 weeks am not sure why this is. 08/31/16; the area on the right lateral foot remains healed. The area on the right lateral calf again is a very tiny wound area that should be healed next week 0.6 x 0.5 x 0.1 09/21/16; patient returns after a three-week hiatus I think with the same dressing on. As predicted 3 weeks ago the wound is actually healed. She has 15 mm TED hose. READMISSION 05/19/2022 This is a now 61 year old woman with a past medical history of venous insufficiency, CKD stage III, hypertension secondary to hyperaldosteronism, and previous venous ulcers on her legs. 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 The wound is on her left lateral lower leg. There is a lot of crusted eschar and slough present. She says the wound is very tender. 05/29/2022: The wound is markedly improved this week. All of the crusting and eschar is gone and there has been substantial epithelialization. There is just a little bit of slough present with good granulation tissue. 06/06/2022: The wound has nearly completely epithelialized. There is some eschar on the surface, underneath which there are 2 small open areas. No concern for infection and her pain has improved significantly. Patient History Information obtained from Patient. Family History Cancer - Mother, Diabetes - Mother,Siblings, Heart Disease - Siblings, Hypertension - Mother,Father,Siblings, Kidney Disease - Siblings, No family history of Hereditary Spherocytosis, Lung Disease, Seizures, Stroke, Thyroid Problems,  Tuberculosis. Social History Current every day smoker - 2 cigarettes daily, Marital Status - Single, Alcohol Use - Rarely - Beer, Drug Use - No History, Caffeine Use - Rarely - Tea. Medical History Eyes Denies history of Cataracts, Glaucoma, Optic Neuritis Ear/Nose/Mouth/Throat Denies history of Chronic sinus problems/congestion, Middle ear problems Hematologic/Lymphatic Denies history of Anemia, Hemophilia, Human Immunodeficiency Virus, Lymphedema, Sickle Cell Disease Respiratory Denies history of Aspiration, Asthma, Chronic Obstructive Pulmonary Disease (COPD), Pneumothorax, Sleep Apnea Cardiovascular Patient has history of Angina, Congestive Heart Failure, Hypertension, Peripheral Venous Disease Denies history of Arrhythmia, Coronary Artery Disease, Deep Vein Thrombosis, Hypotension, Myocardial Infarction, Peripheral Arterial Disease, Phlebitis, Vasculitis Gastrointestinal Denies history of Cirrhosis , Colitis, Crohnoos, Hepatitis A, Hepatitis B, Hepatitis C Endocrine Denies history of Type I Diabetes, Type II Diabetes Genitourinary Denies history of End Stage Renal Disease Immunological Denies history of Lupus Erythematosus, Raynaudoos, Scleroderma Integumentary (Skin) Denies history of History of Burn Musculoskeletal Denies history of Gout, Rheumatoid Arthritis, Osteoarthritis, Osteomyelitis [name], [name] ([id number]) [id number] Page 7 of 10 Neurologic Denies history of Dementia, Neuropathy, Quadriplegia, Paraplegia, Seizure Disorder Oncologic Denies history of Received Chemotherapy, Received Radiation Psychiatric Denies history of Anorexia/bulimia, Confinement Anxiety Medical A Surgical History Notes nd Genitourinary Kidney disease-stable; sees nephrologist Objective Constitutional Hypertensive, asymptomatic. No acute distress.. Vitals Time Taken: 2:58 PM, Height: 68 in, Weight: 193 lbs, BMI: 29.3, Temperature: 99.2 F, Pulse: 78 bpm,  Respiratory Rate: 18 breaths/min, Blood Pressure: 188/86 mmHg. Respiratory Normal work of breathing on room air.. General Notes: 06/06/2022: The wound has nearly completely epithelialized. There is some eschar on the surface, underneath which there are 2 small open areas. No concern for infection and her pain has improved significantly. Integumentary (Hair, Skin) Wound #21 status is Open. Original cause of wound was Gradually Appeared. The date acquired was: 04/28/2022. The  wound has been in treatment 2 weeks. The wound is located on the Left,Lateral Lower Leg. The wound measures 0.3cm length x 0.2cm width x 0.1cm depth; 0.047cm^2 area and 0.005cm^3 volume. There is no tunneling or undermining noted. There is a medium amount of serosanguineous drainage noted. The wound margin is distinct with the outline attached to the wound base. There is small (1-33%) pink granulation within the wound bed. There is a large (67-100%) amount of necrotic tissue within the wound bed including Eschar. The periwound skin appearance exhibited: Scarring, Dry/Scaly, Hemosiderin Staining. Periwound temperature was noted as No Abnormality. The periwound has tenderness on palpation. 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 There was a Selective/Open Wound Non-Viable Tissue Debridement with a total area of 2 sq cm performed by Summer Maudlin, MD. With the following instrument(s): Curette to remove Non-Viable tissue/material. Material removed includes Eschar after achieving pain control using Lidocaine 4% Topical Solution. No specimens were taken. A time out was  conducted at 15:20, prior to the start of the procedure. A Minimum amount of bleeding was controlled with Pressure. The procedure was tolerated well with a pain level of 3 throughout and a pain level of 1 following the procedure. Post Debridement Measurements: 0.2cm length x 0.2cm width x 0.1cm depth; 0.003cm^3 volume. Character of Wound/Ulcer Post Debridement is improved. Severity of Tissue Post Debridement is: Fat layer exposed. Post procedure Diagnosis Wound #21: Same as Pre-Procedure General Notes: scribed by Summer Gouty, RN for Dr. Celine Chavez. Pre-procedure diagnosis of Wound #21 is a Venous Leg Ulcer located on the Left,Lateral Lower Leg . There was a Four Layer Compression Therapy Procedure by Summer Gouty, RN. Post procedure Diagnosis Wound #21: Same as Pre-Procedure Plan Follow-up Appointments: Return Appointment in 1 week. - Dr. Celine Chavez [facility] [address] [place], [place] ([id number]) [id number] Page 8 of 10 A nesthetic: Wound #21 Left,Lateral Lower Leg: (In clinic) Topical Lidocaine 4% applied to wound bed Bathing/ Shower/ Hygiene: May shower with protection but do not get wound dressing(s) wet. Edema Control - Lymphedema / SCD / Other: Elevate legs to the level of the heart or above for 30 minutes daily and/or when sitting, a frequency of: Avoid standing for long periods of time. Patient to wear own compression stockings every day. - right leg daily Exercise regularly WOUND #21: - Lower Leg Wound Laterality: Left, Lateral Peri-Wound Care: Sween Lotion (Moisturizing lotion) 1 x Per Week/30 Days Discharge Instructions: Apply moisturizing lotion as directed Prim Dressing: KerraCel Ag Gelling Fiber Dressing, 4x5 in (silver alginate) 1 x Per Week/30 Days ary Discharge Instructions: Apply silver alginate to wound bed as instructed Secondary Dressing: Woven Gauze Sponge, Non-Sterile 4x4 in 1 x Per Week/30 Days Discharge Instructions: Apply over primary dressing as  directed. Com pression Wrap: FourPress (4 layer compression wrap) 1 x Per Week/30 Days Discharge Instructions: Apply four layer compression as directed. May also use Miliken CoFlex 2 layer compression system as alternative. 06/06/2022: The wound has nearly completely epithelialized. There is some eschar on the surface, underneath which there are 2 small open areas. No concern for infection and her pain has improved significantly. I used a curette to debride the eschar from the wound. I think we can  discontinue both the Santyl and the gentamicin. We will continue silver alginate and 4- layer compression. I anticipate that she will likely be completely healed at her visit next week. Electronic Signature(s) Signed: 06/06/2022 5:34:41 PM By: Summer Gouty RN, BSN Signed: 06/07/2022 7:41:14 AM By: Summer Maudlin MD FACS Previous Signature: 06/06/2022 3:38:43 PM Version By: Summer Maudlin MD FACS Entered By: [name] on 06/06/2022 16:18:39 -------------------------------------------------------------------------------- HxROS Details Patient Name: Date of Service: [name], [place] [name] 06/06/2022 2:45 PM Medical Record Number: [id number] Patient Account Number: [id number] Date of Birth/Sex: Treating RN: Mar 13, 1961 (61 y.o. F) Primary Care Provider: Minette Chavez Other Clinician: Referring Provider: Treating Provider/Extender: [name] in Treatment: 2 Information Obtained From Patient Eyes Medical History: Negative for: Cataracts; Glaucoma; Optic Neuritis Ear/Nose/Mouth/Throat Medical History: Negative for: Chronic sinus problems/congestion; Middle ear problems Hematologic/Lymphatic Medical History: Negative for: Anemia; Hemophilia; Human Immunodeficiency Virus; Lymphedema; Sickle Cell Disease Respiratory Medical History: Negative for: Aspiration; Asthma; Chronic Obstructive Pulmonary Disease (COPD); Pneumothorax; Sleep Apnea Cardiovascular Medical  History: Positive for: Angina; Congestive Heart Failure; Hypertension; Peripheral Venous Disease [name], [name] ([id number]) [id number] Page 9 of 10 Negative for: Arrhythmia; Coronary Artery Disease; Deep Vein Thrombosis; Hypotension; Myocardial Infarction; Peripheral Arterial Disease; Phlebitis; Vasculitis Gastrointestinal Medical History: Negative for: Cirrhosis ; Colitis; Crohns; Hepatitis A; Hepatitis B; Hepatitis C Endocrine Medical History: Negative for: Type I Diabetes; Type II Diabetes Genitourinary Medical History: Negative for: End Stage Renal Disease Past Medical History Notes: Kidney disease-stable; sees nephrologist Immunological Medical History: Negative for: Lupus Erythematosus; Raynauds; Scleroderma Integumentary (Skin) Medical History: Negative for: History of Burn Musculoskeletal Medical History: Negative for: Gout; Rheumatoid Arthritis; Osteoarthritis; Osteomyelitis Neurologic Medical History: Negative for: Dementia; Neuropathy; Quadriplegia; Paraplegia; Seizure Disorder Oncologic Medical History: Negative for: Received Chemotherapy; Received Radiation Psychiatric Medical History: Negative for: Anorexia/bulimia; Confinement Anxiety Immunizations Implantable Devices No devices added Family and Social History Cancer: Yes - Mother; Diabetes: Yes - Mother,Siblings; Heart Disease: Yes - Siblings; Hereditary Spherocytosis: No; Hypertension: Yes - Mother,Father,Siblings; Kidney Disease: Yes - Siblings; Lung Disease: No; Seizures: No; Stroke: No; Thyroid Problems: No; Tuberculosis: No; Current every day smoker - 2 cigarettes daily; Marital Status - Single; Alcohol Use: Rarely - Beer; Drug Use: No History; Caffeine Use: Rarely - T Financial Concerns: ea; Yes; Food, Clothing or Shelter Needs: No; Support System Lacking: No; Transportation Concerns: No Electronic Signature(s) Signed: 06/06/2022 3:51:29 PM By: Summer Maudlin MD  FACS Entered By: [name] on 06/06/2022 15:35:36 SuperBill Details -------------------------------------------------------------------------------- [name] ([id number]) [id number] Page 10 of 10 Patient Name: Date of Service: [name] Chavez, [place] [name] 06/06/2022 Medical Record Number: [id number] Patient Account Number: [id number] Date of Birth/Sex: Treating RN: 10-21-1960 (61 y.o. F) Primary Care Provider: Minette Chavez Other Clinician: Referring Provider: Treating Provider/Extender: Burley Saver Weeks in Treatment: 2 Diagnosis Coding ICD-10 Codes Code Description [phone] Varicose veins of left lower extremity with other complications U44.03 Chronic kidney disease, stage 3 unspecified L97.822 Non-pressure chronic ulcer of other part of left lower leg with fat layer exposed Facility Procedures CPT4 Code Description Modifier Quantity [id number] - DEBRIDE WOUND 1ST 20 SQ CM OR < 1 ICD-10 Diagnosis Description L97.822 Non-pressure chronic ulcer of other part of left lower leg with fat layer exposed Physician Procedures Quantity CPT4 Code Description Modifier [id number] - WC PHYS LEVEL 3 - EST PT 25 1 ICD-10 Diagnosis Description L97.822 Non-pressure chronic ulcer of other part of left lower leg with fat layer exposed N18.30 Chronic kidney disease, stage 3 unspecified I83.892 Varicose veins  of left lower extremity with other complications [id number] - WC PHYS DEBR WO ANESTH 20 SQ CM 1 ICD-10 Diagnosis Description L97.822 Non-pressure chronic ulcer of other part of left lower leg with fat layer exposed Electronic Signature(s) Signed: 06/06/2022 3:39:02 PM By: Summer Maudlin MD FACS Entered By: [name] on 06/06/2022 15:39:02

## 2022-06-13 ENCOUNTER — Encounter (HOSPITAL_BASED_OUTPATIENT_CLINIC_OR_DEPARTMENT_OTHER): Payer: Medicare Other | Admitting: General Surgery

## 2022-06-13 DIAGNOSIS — I83028 Varicose veins of left lower extremity with ulcer other part of lower leg: Secondary | ICD-10-CM | POA: Diagnosis not present

## 2022-06-13 DIAGNOSIS — I872 Venous insufficiency (chronic) (peripheral): Secondary | ICD-10-CM | POA: Diagnosis not present

## 2022-06-13 DIAGNOSIS — Z09 Encounter for follow-up examination after completed treatment for conditions other than malignant neoplasm: Secondary | ICD-10-CM | POA: Diagnosis not present

## 2022-06-13 DIAGNOSIS — L97822 Non-pressure chronic ulcer of other part of left lower leg with fat layer exposed: Secondary | ICD-10-CM | POA: Diagnosis not present

## 2022-06-13 NOTE — Progress Notes (Addendum)
GENTRY, SEEBER ([id number]) [id number] Page 1 of 9 Visit Report for 06/13/2022 Chief Complaint Document Details Patient Name: Date of Service: [name] Chavez, [place] RO LYN 06/13/2022 8:15 A M Medical Record Number: [id number] Patient Account Number: [id number] Date of Birth/Sex: Treating RN: 03-22-1961 (61 y.o. F) Primary Care Provider: [name] Other Clinician: Referring Provider: Treating Provider/Extender: [name] in Treatment: 3 Information Obtained from: Patient Chief Complaint Patient presents for treatment of an open ulcer due to venous insufficiency. she has had a recurrent ulceration of the left lower extremity for about 3 weeks 05/19/2022: The patient returns after a 5-year absence with a recurrent ulceration on the left lower extremity at the same site as previous wounds Electronic Signature(s) Signed: 06/13/2022 8:33:39 AM By: Fredirick Maudlin MD FACS Entered By: Fredirick Maudlin on 06/13/2022 08:33:38 -------------------------------------------------------------------------------- HPI Details Patient Name: Date of Service: [name], [name] 06/13/2022 8:15 A M Medical Record Number: [id number] Patient Account Number: [id number] Date of Birth/Sex: Treating RN: [birth year] (61 y.o. F) Primary Care Provider: [name] Other Clinician: Referring Provider: Treating Provider/Extender: [name] in Treatment: 3 History of Present Illness Location: Ulcer on the left lower extremity Quality: Patient reports experiencing a sharp pain to affected area(s). Severity: Presents with a pain of ____8_on a pain scale of 1-10. Duration: Patient has had the wound for < 2 weeks prior to presenting for treatment Timing: Pain in wound is constant (hurts all the time) Context: The wound would happen gradually Modifying Factors: Patient wound(s)/ulcer(s) are improving due LO:VFIEPPI her compression  stockings regularly ssociated Signs and Symptoms: Patient reports presence of swelling A HPI Description: 61 year old patient who is known to have significant hypertension, venous stasis ulcer, diastolic heart failure, hypokalemia, CHF, chronic bronchitis and nicotine addiction has been seen in the wound center previously and now comes back with a left lower extremity ulceration. She says she's been wearing her stockings regularly but I believe she's been noncompliant from the description of when she wears it and how often she wears it. She also sees her PCP on a regular basis but I believe her blood pressure has been running very high and her diastolic has been as high as 130. Her systolic normally runs in the low 200s. She has had her last venous study done in 2014 and this was a DVT study. She has not had any endovenous ablation. 06/09/2015 -- her arterial studies revealed that her right ABI was 1.14 and the left ABI was 1.18. Her venous duplex study revealed there was no evidence of DVT or SVT There was deep vein reflux in the left common femoral vein. There was also reflux . noted in the left saphenofemoral junction and proximal to the mid great saphenous vein. In view of this I am going to recommend a vascular surgery opinion to see what procedure would best serve her. 06/16/2015 -- her vascular opinion is on November 8 until then we will continue with local care. 06/23/2015 -- she continues to smoke about 4 cigarettes a day and other than that has been very compliant with her management. 06/30/2015 -- she saw Dr. Kellie Simmering recently who recommended: Temitope Flammer is a 61 y.o. female who presents with: bilateral lower extremity chronic venous insufficiency (C6). The patient has a history of bilateral lower extremity venous stasis ulceration. Her current left leg wound is healing with unna boot application. All of her previous venous stasis ulcers have healed within 4 weeks. Her great  saphenous veins bilaterally on  sonosite exam were not markedly dilated. Since her current left leg wound is healing, will have her follow up in 3 months for further evaluation. [name] ([id number]) [id number] Page 2 of 9 She has adequate arterial circulation. She will continue to go to the wound center for unna boots. Recommended use of compression stocks for swelling management once her left leg wound hass healed. Advised her to also wear compression on her right leg. 07/14/2015 -- she does not have her stockings with her today but she is going right from here to the store to buy this compression stockings. He still continues to smoke couple of cigarettes a day 10/07/15; the patient returns today with a recurrent inflamed ulceration on the lateral left leg. We know her from previous recurrent ulcerations on her legs largely related to venous insufficiency. Previous arterial evaluation in October 2016 showed ABI of 1.14 on the right 1.18 on the left with triphasic waves in both the posterior tibial and dorsalis pedis. She does not have a significant arterial issue. She has been followed by Dr. Kellie [facility] She has deep vein reflux noted in the left common femoral vein and reflux noted in the left saphenofemoral junction and proximal to mid great saphenous vein although his last note seems to suggest that no interventions at this time. In the meantime she is back here with a significant inflamed macerated area on her left lateral leg which is open already but certainly is a threatening larger ulceration. 10/14/15; the area looks a lot better than last week. I was concerned about a large impending ulceration or multiple small ulcerations however with edema control, and perhaps the doxycycline things actually look a lot better. Probably mostly edema control. We have been using silver alginate and Profore 10/21/15; only one dime-sized open area remains.  This required surgical debridement. There is no evidence rounding infection she is completing her doxycycline. Edema control appears adequate. Still severe surrounding venous stasis and inflammation 10/28/15; wound is continuing to contractor. No debridement was done. There is no evidence of surrounding infection. She has chronic venous insufficiency with inflammation however her edema is well controlled no other issues were seen 11/05/15 wound continues to contractor. Surgical debridement done of nonviable's edges surface slough. This continues to look as though it is contracting 11/11/15 wound continues to contract and epithelialize. No debridement was necessary. This looks as though it is progressing towards closure next week 11/18/15 wound is healed. READMISSION 06/08/16; patient we know previously who has chronic venous insufficiency with inflammation and recurrent lower extremity ulceration. She has been in this clinic at least 2 prior occasions most recently earlier this year with a wound on her lateral left leg. We discharge during graded pressure stockings. She is not a diabetic. She has not have known PAD. She states is slightly over a month ago she developed increasing swelling in the right leg and an open wound on the lateral aspect of her right calf which is been getting worse over the last 3 weeks she's trying to Dr. this herself with topical antibiotics this is not work she is back in our clinic for review of that 06/15/16; still a nonviable surface that requires debridement still using Santyl 06/22/16; the patient has a new wound on her right lateral foot of uncertain etiology there is some tenderness around this area 06/29/16; the area on the right lateral leg appears to be better. His area on the right lateral foot is no better 07/06/16; she has  a healthy wound on the right lateral leg however this is not appear to be smaller than last week 3.1 x 2.7 x 0.2. Her area on the right  lateral foot really looks about the same 07/20/16; some improvement are noted in both wounds on the right lateral calf also on the right lateral foot. Improvement in dimensions. 08/03/16; left her wrap on for 2 weeks. However both wound areas look improved the right lateral calf in terms of dimensions and also the right lateral foot. Still debridement in the right lateral Calf 08/17/16; the area on the right lateral foot has healed. The area on the right lateral calf improved again she leaves her wrap on for 2 weeks am not sure why this is. 08/31/16; the area on the right lateral foot remains healed. The area on the right lateral calf again is a very tiny wound area that should be healed next week 0.6 x 0.5 x 0.1 09/21/16; patient returns after a three-week hiatus I think with the same dressing on. As predicted 3 weeks ago the wound is actually healed. She has 15 mm TED hose. READMISSION 05/19/2022 This is a now 61 year old woman with a past medical history of venous insufficiency, CKD stage III, hypertension secondary to hyperaldosteronism, and previous venous ulcers on her legs. 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 The wound is on her left lateral lower leg. There is a lot of crusted eschar and slough present. She says the wound is very tender. 05/29/2022: The wound is markedly improved this week. All of the crusting and eschar is gone and there has been substantial epithelialization. There is just a little bit of slough present with good granulation tissue. 06/06/2022: The wound has nearly completely epithelialized. There is some eschar on the surface, underneath which there are 2 small open areas. No concern for infection and her pain has improved significantly. 06/13/2022: Her wound has healed. Electronic Signature(s) Signed: 06/13/2022 8:35:06 AM By: Fredirick Maudlin MD FACS Entered By: Fredirick Maudlin on 06/13/2022  08:35:06 -------------------------------------------------------------------------------- Physical Exam Details Patient Name: Date of Service: [name], [name] 06/13/2022 8:15 A M Medical Record Number: [id number] Patient Account Number: [id number] Date of Birth/Sex: Treating RN: [birth year] (61 y.o. F) Primary Care Provider: [name] Other Clinician: Referring Provider: Treating Provider/Extender: Burley Saver Weeks in Treatment: 3 Constitutional Hypertensive, asymptomatic. . . . No acute distress.Marland Kitchen Respiratory Normal work of breathing on room air.Marland Kitchen JOLEAN, MADARIAGA ([id number]) [id number] Page 3 of 9 Notes 06/13/2022: Her wound is healed. Electronic Signature(s) Signed: 06/13/2022 8:35:41 AM By: Fredirick Maudlin MD FACS Entered By: Fredirick Maudlin on 06/13/2022 08:35:41 -------------------------------------------------------------------------------- Physician Orders Details Patient Name: Date of Service: [name] 06/13/2022 8:15 A M Medical Record Number: [id number] Patient Account Number: [id number] Date of Birth/Sex: Treating RN: 1961-04-27 (61 y.o. Harlow Ohms Primary Care Provider: [name] Other Clinician: Referring Provider: Treating Provider/Extender: [name] in Treatment: 3 Verbal / Phone Orders: No Diagnosis Coding ICD-10 Coding Code Description [phone] Varicose veins of left lower extremity with other complications Y65.99 Chronic kidney disease, stage 3 unspecified L97.822 Non-pressure chronic ulcer of other part of left lower leg with fat layer exposed Discharge From [facility] Discharge from [place]!!!! Edema Control - Lymphedema / SCD / Other Elevate legs to the level of the heart or above for 30 minutes daily and/or when sitting, a frequency  of: Avoid standing for long periods of time. Patient to wear own compression  stockings every day. - right leg daily Exercise regularly Moisturize legs daily. Electronic Signature(s) Signed: 06/13/2022 8:35:49 AM By: Fredirick Maudlin MD FACS Entered By: Fredirick Maudlin on 06/13/2022 08:35:48 -------------------------------------------------------------------------------- Problem List Details Patient Name: Date of Service: [name] 06/13/2022 8:15 A M Medical Record Number: [id number] Patient Account Number: [id number] Date of Birth/Sex: Treating RN: 10/07/1960 (61 y.o. F) Primary Care Provider: [name] Other Clinician: Referring Provider: Treating Provider/Extender: [name] in Treatment: 3 Active Problems ICD-10 Encounter Code Description Active Date MDM Diagnosis I83.892 Varicose veins of left lower extremity with other complications 04/15/4157 No Yes Inge, Taylormarie ([id number]) [id number] Page 4 of 9 N18.30 Chronic kidney disease, stage 3 unspecified 05/19/2022 No Yes L97.822 Non-pressure chronic ulcer of other part of left lower leg with fat layer exposed9/29/2023 No Yes Inactive Problems Resolved Problems Electronic Signature(s) Signed: 06/13/2022 8:33:18 AM By: Fredirick Maudlin MD FACS Entered By: Fredirick Maudlin on 06/13/2022 08:33:18 -------------------------------------------------------------------------------- Progress Note Details Patient Name: Date of Service: [name] 06/13/2022 8:15 A M Medical Record Number: [id number] Patient Account Number: [id number] Date of Birth/Sex: Treating RN: [birth year] (61 y.o. F) Primary Care Provider: [name] Other Clinician: Referring Provider: Treating Provider/Extender: [name] in Treatment: 3 Subjective Chief Complaint Information obtained from Patient Patient presents for treatment of an open ulcer due to venous insufficiency. she has had a recurrent ulceration of the left lower  extremity for about 3 weeks 05/19/2022: The patient returns after a 5-year absence with a recurrent ulceration on the left lower extremity at the same site as previous wounds History of Present Illness (HPI) The following HPI elements were documented for the patient's wound: Location: Ulcer on the left lower extremity Quality: Patient reports experiencing a sharp pain to affected area(s). Severity: Presents with a pain of ____8_on a pain scale of 1-10. Duration: Patient has had the wound for < 2 weeks prior to presenting for treatment Timing: Pain in wound is constant (hurts all the time) Context: The wound would happen gradually Modifying Factors: Patient wound(s)/ulcer(s) are improving due [id number] her compression stockings regularly Associated Signs and Symptoms: Patient reports presence of swelling 61 year old patient who is known to have significant hypertension, venous stasis ulcer, diastolic heart failure, hypokalemia, CHF, chronic bronchitis and nicotine addiction has been seen in the wound center previously and now comes back with a left lower extremity ulceration. She says she's been wearing her stockings regularly but I believe she's been noncompliant from the description of when she wears it and how often she wears it. She also sees her PCP on a regular basis but I believe her blood pressure has been running very high and her diastolic has been as high as 130. Her systolic normally runs in the low 200s. She has had her last venous study done in 2014 and this was a DVT study. She has not had any endovenous ablation. 06/09/2015 -- her arterial studies revealed that her right ABI was 1.14 and the left ABI was 1.18. Her venous duplex study revealed there was no evidence of DVT or SVT There was deep vein reflux in the left common femoral vein. There was also reflux . noted in the left saphenofemoral junction and proximal to the mid great saphenous vein. In view of this I am going to  recommend a vascular surgery opinion to see what procedure would best serve her. 06/16/2015 --  her vascular opinion is on November 8 until then we will continue with local care. 06/23/2015 -- she continues to smoke about 4 cigarettes a day and other than that has been very compliant with her management. 06/30/2015 -- she saw Dr. Kellie Simmering recently who recommended: Sanya Kobrin is a 61 y.o. female who presents with: bilateral lower extremity chronic venous insufficiency (C6).  The patient has a history of bilateral lower extremity venous stasis ulceration. Her current left leg wound is healing with unna boot application. All of her previous venous stasis ulcers have healed within 4 weeks.  Her great saphenous veins bilaterally on sonosite exam were not markedly dilated. Since her current left leg wound is healing, will have her follow up in 3 months for further evaluation.  She has adequate arterial circulation.  She will continue to go to the wound center for unna boots.  Recommended use of compression stocks for swelling management once her left leg wound hass healed. Advised her to also wear compression on her right leg. 07/14/2015 -- she does not have her stockings with her today but she is going right from here to the store to buy this compression stockings. He still continues to smoke couple of cigarettes a day Mcnutt, Alynn ([id number]) [id number] Page 5 of 9 10/07/15; the patient returns today with a recurrent inflamed ulceration on the lateral left leg. We know her from previous recurrent ulcerations on her legs largely related to venous insufficiency. Previous arterial evaluation in October 2016 showed ABI of 1.14 on the right 1.18 on the left with triphasic waves in both the posterior tibial and dorsalis pedis. She does not have a significant arterial issue. She has been followed by [facility] She has deep vein reflux noted  in the left common femoral vein and reflux noted in the left saphenofemoral junction and proximal to mid great saphenous vein although his last note seems to suggest that no interventions at this time. In the meantime she is back here with a significant inflamed macerated area on her left lateral leg which is open already but certainly is a threatening larger ulceration. 10/14/15; the area looks a lot better than last week. I was concerned about a large impending ulceration or multiple small ulcerations however with edema control, and perhaps the doxycycline things actually look a lot better. Probably mostly edema control. We have been using silver alginate and Profore 10/21/15; only one dime-sized open area remains. This required surgical debridement. There is no evidence rounding infection she is completing her doxycycline. Edema control appears adequate. Still severe surrounding venous stasis and inflammation 10/28/15; wound is continuing to contractor. No debridement was done. There is no evidence of surrounding infection. She has chronic venous insufficiency with inflammation however her edema is well controlled no other issues were seen 11/05/15 wound continues to contractor. Surgical debridement done of nonviable's edges surface slough. This continues to look as though it is contracting 11/11/15 wound continues to contract and epithelialize. No debridement was necessary. This looks as though it is progressing towards closure next week 11/18/15 wound is healed. READMISSION 06/08/16; patient we know previously who has chronic venous insufficiency with inflammation and recurrent lower extremity ulceration. She has been in this clinic at least 2 prior occasions most recently earlier this year with a wound on her lateral left leg. We discharge during graded pressure stockings. She is not a diabetic. She has not have known PAD. She states is slightly over a month ago she developed  increasing swelling in the  right leg and an open wound on the lateral aspect of her right calf which is been getting worse over the last 3 weeks she's trying to Dr. this herself with topical antibiotics this is not work she is back in our clinic for review of that 06/15/16; still a nonviable surface that requires debridement still using Santyl 06/22/16; the patient has a new wound on her right lateral foot of uncertain etiology there is some tenderness around this area 06/29/16; the area on the right lateral leg appears to be better. His area on the right lateral foot is no better 07/06/16; she has a healthy wound on the right lateral leg however this is not appear to be smaller than last week 3.1 x 2.7 x 0.2. Her area on the right lateral foot really looks about the same 07/20/16; some improvement are noted in both wounds on the right lateral calf also on the right lateral foot. Improvement in dimensions. 08/03/16; left her wrap on for 2 weeks. However both wound areas look improved the right lateral calf in terms of dimensions and also the right lateral foot. Still debridement in the right lateral Calf 08/17/16; the area on the right lateral foot has healed. The area on the right lateral calf improved again she leaves her wrap on for 2 weeks am not sure why this is. 08/31/16; the area on the right lateral foot remains healed. The area on the right lateral calf again is a very tiny wound area that should be healed next week 0.6 x 0.5 x 0.1 09/21/16; patient returns after a three-week hiatus I think with the same dressing on. As predicted 3 weeks ago the wound is actually healed. She has 15 mm TED hose. READMISSION 05/19/2022 This is a now 61 year old woman with a past medical history of venous insufficiency, CKD stage III, hypertension secondary to hyperaldosteronism, and previous venous ulcers on her legs. 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 The wound  is on her left lateral lower leg. There is a lot of crusted eschar and slough present. She says the wound is very tender. 05/29/2022: The wound is markedly improved this week. All of the crusting and eschar is gone and there has been substantial epithelialization. There is just a little bit of slough present with good granulation tissue. 06/06/2022: The wound has nearly completely epithelialized. There is some eschar on the surface, underneath which there are 2 small open areas. No concern for infection and her pain has improved significantly. 06/13/2022: Her wound has healed. Patient History Information obtained from Patient. Family History Cancer - Mother, Diabetes - Mother,Siblings, Heart Disease - Siblings, Hypertension - Mother,Father,Siblings, Kidney Disease - Siblings, No family history of Hereditary Spherocytosis, Lung Disease, Seizures, Stroke, Thyroid Problems, Tuberculosis. Social History Current every day smoker - 2 cigarettes daily, Marital Status - Single, Alcohol Use - Rarely - Beer, Drug Use - No History, Caffeine Use - Rarely - Tea. Medical History Eyes Denies history of Cataracts, Glaucoma, Optic Neuritis Ear/Nose/Mouth/Throat Denies history of Chronic sinus problems/congestion, Middle ear problems Hematologic/Lymphatic Denies history of Anemia, Hemophilia, Human Immunodeficiency Virus, Lymphedema, Sickle Cell Disease Respiratory Denies history of Aspiration, Asthma, Chronic Obstructive Pulmonary Disease (COPD), Pneumothorax, Sleep Apnea Cardiovascular Patient has history of Angina, Congestive Heart Failure, Hypertension, Peripheral Venous Disease Denies history of Arrhythmia, Coronary Artery Disease, Deep Vein Thrombosis, Hypotension, Myocardial Infarction, Peripheral Arterial Disease, Phlebitis, Vasculitis Gastrointestinal Denies  history of Cirrhosis , Colitis, Crohnoos, Hepatitis A, Hepatitis B, Hepatitis C Endocrine Denies history of Type I Diabetes, Type II  Diabetes Genitourinary Denies history of End Stage Renal Disease Immunological Denies history of Lupus Erythematosus, Raynaudoos, Scleroderma Integumentary (Skin) Denies history of History of Burn Musculoskeletal Denies history of Gout, Rheumatoid Arthritis, Osteoarthritis, Osteomyelitis Neurologic Denies history of Dementia, Neuropathy, Quadriplegia, Paraplegia, Seizure Disorder Oncologic Denies history of Received Chemotherapy, Received Radiation Psychiatric Seguin, Hoyle Sauer ([id number]) [id number] Page 6 of 9 Denies history of Anorexia/bulimia, Confinement Anxiety Medical A Surgical History Notes nd Genitourinary Kidney disease-stable; sees nephrologist Objective Constitutional Hypertensive, asymptomatic. No acute distress.. Vitals Time Taken: 8:25 AM, Height: 68 in, Weight: 193 lbs, BMI: 29.3, Temperature: 99.2 F, Pulse: 76 bpm, Respiratory Rate: 18 breaths/min, Blood Pressure: 188/91 mmHg. Respiratory Normal work of breathing on room air.. General Notes: 06/13/2022: Her wound is healed. Integumentary (Hair, Skin) Wound #21 status is Open. Original cause of wound was Gradually Appeared. The date acquired was: 04/28/2022. The wound has been in treatment 3 weeks. The wound is located on the Left,Lateral Lower Leg. The wound measures 0cm length x 0cm width x 0cm depth; 0cm^2 area and 0cm^3 volume. There is no tunneling or undermining noted. There is a none present amount of drainage noted. The wound margin is flat and intact. There is no granulation within the wound bed. There is no necrotic tissue within the wound bed. The periwound skin appearance had no abnormalities noted for color. The periwound skin appearance exhibited: Scarring, Dry/Scaly. Periwound temperature was noted as No Abnormality. The periwound has tenderness on palpation. 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 Edema Control - Lymphedema / SCD / Other: Elevate legs to the level of the heart or above for 30 minutes daily and/or when sitting, a frequency of: Avoid standing for long periods of time. Patient to wear own compression stockings every day. - right leg daily Exercise regularly Moisturize legs daily. 06/13/2022: Her wound has healed. She was reminded to wear her compression stockings religiously and elevate her legs whenever possible throughout the day and at night while she sleeps. She was also advised to moisturize her legs liberally to prevent cracking and skin breakdown from dryness. We will discharge her from the wound care center. Follow-up as needed. Electronic Signature(s) Signed: 06/13/2022 8:36:25 AM By: Fredirick Maudlin MD FACS Entered By: Fredirick Maudlin on 06/13/2022 08:36:25 Melford Aase ([id number]) [id number] Page 7 of 9 -------------------------------------------------------------------------------- HxROS Details Patient Name: Date of Service: [name] [place]avez, [name] 06/13/2022 8:15 A M Medical Record Number: [id number] Patient Account Number: [id number] Date of Birth/Sex: Treating RN: [birth year] (61 y.o. F) Primary Care Provider: [name] Other Clinician: Referring Provider: Treating Provider/Extender: [name] in Treatment: 3 Information Obtained From Patient Eyes Medical History: Negative for: Cataracts; Glaucoma; Optic Neuritis Ear/Nose/Mouth/Throat Medical History: Negative for: Chronic sinus problems/congestion; Middle ear problems Hematologic/Lymphatic Medical History: Negative for: Anemia; Hemophilia; Human Immunodeficiency Virus; Lymphedema; Sickle Cell Disease Respiratory Medical  History: Negative for: Aspiration; Asthma; Chronic Obstructive Pulmonary Disease (COPD); Pneumothorax; Sleep Apnea Cardiovascular Medical History: Positive for: Angina; Congestive Heart Failure; Hypertension; Peripheral Venous Disease Negative for: Arrhythmia; Coronary Artery Disease; Deep Vein Thrombosis; Hypotension; Myocardial Infarction; Peripheral Arterial Disease; Phlebitis; Vasculitis Gastrointestinal Medical History: Negative for: Cirrhosis ; Colitis; Crohns;  Hepatitis A; Hepatitis B; Hepatitis C Endocrine Medical History: Negative for: Type I Diabetes; Type II Diabetes Genitourinary Medical History: Negative for: End Stage Renal Disease Past Medical History Notes: Kidney disease-stable; sees nephrologist Immunological Medical History: Negative for: Lupus Erythematosus; Raynauds; Scleroderma Integumentary (Skin) Medical History: Negative for: History of Burn Musculoskeletal Medical History: Negative for: Gout; Rheumatoid Arthritis; Osteoarthritis; Osteomyelitis Damico, Keesha ([id number]) [id number] Page 8 of 9 Neurologic Medical History: Negative for: Dementia; Neuropathy; Quadriplegia; Paraplegia; Seizure Disorder Oncologic Medical History: Negative for: Received Chemotherapy; Received Radiation Psychiatric Medical History: Negative for: Anorexia/bulimia; Confinement Anxiety Immunizations Implantable Devices No devices added Family and Social History Cancer: Yes - Mother; Diabetes: Yes - Mother,Siblings; Heart Disease: Yes - Siblings; Hereditary Spherocytosis: No; Hypertension: Yes - Mother,Father,Siblings; Kidney Disease: Yes - Siblings; Lung Disease: No; Seizures: No; Stroke: No; Thyroid Problems: No; Tuberculosis: No; Current every day smoker - 2 cigarettes daily; Marital Status - Single; Alcohol Use: Rarely - Beer; Drug Use: No History; Caffeine Use: Rarely - T Financial Concerns: ea; Yes; Food, Clothing or Shelter Needs: No;  Support System Lacking: No; Transportation Concerns: No Electronic Signature(s) Signed: 06/13/2022 11:09:58 AM By: Fredirick Maudlin MD FACS Entered By: Fredirick Maudlin on 06/13/2022 08:35:14 -------------------------------------------------------------------------------- SuperBill Details Patient Name: Date of Service: [name], [name] 06/13/2022 Medical Record Number: [id number] Patient Account Number: [id number] Date of Birth/Sex: Treating RN: [birth year] (61 y.o. 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 D62.22 Chronic kidney disease, stage 3 unspecified L97.822 Non-pressure chronic ulcer of other part of left lower leg with fat layer exposed Facility Procedures : CPT4 Code: [id number] Description: Long VISIT-LEV 3 EST PT Modifier: Quantity: 1 Physician Procedures : CPT4 Code Description Modifier [id number] - WC PHYS LEVEL 3 - EST PT ICD-10 Diagnosis Description I83.892 Varicose 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

## 2022-06-15 NOTE — Progress Notes (Signed)
[name] ([id number]) [id number] Page 1 of 8 Visit Report for 06/13/2022 Arrival Information Details Patient Name: Date of Service: [name] RD, [place] RO Chavez 06/13/2022 8:15 A M Medical Record Number: [id number] Patient Account Number: [id number] Date of Birth/Sex: Treating RN: [birth year] (61 y.o. [name] Primary Care Tami Barren: [name] Other Clinician: Referring Kripa Foskey: Treating Americus Scheurich/Extender: [name] in Treatment: 3 Visit Information History Since Last Visit Added or deleted any medications: No Patient Arrived: Ambulatory Any new allergies or adverse reactions: No Arrival Time: 08:25 Had a fall or experienced change in No Accompanied By: self activities of daily living that may affect Transfer Assistance: None risk of falls: Patient Identification Verified: Yes Signs or symptoms of abuse/neglect since last visito No Secondary Verification Process Completed: Yes Hospitalized since last visit: No Patient Requires Transmission-Based Precautions: No Implantable device outside of the clinic excluding No Patient Has Alerts: No cellular tissue based products placed in the center since last visit: Has Dressing in Place as Prescribed: Yes Has Compression in Place as Prescribed: Yes Pain Present Now: No Electronic Signature(s) Signed: 06/13/2022 4:28:24 PM By: Adline Peals Entered By: Adline Peals on 06/13/2022 08:25:16 -------------------------------------------------------------------------------- Clinic Level of Care Assessment Details Patient Name: Date of Service: [name] 06/13/2022 8:15 A M Medical Record Number: [id number] Patient Account Number: [id number] Date of Birth/Sex: Treating RN: 1961-07-03 (61 y.o. [name] Primary Care Dyllon Henken: [name] Other Clinician: Referring Perris Tripathi: Treating Darrly Loberg/Extender: [name]  in Treatment: 3 Clinic Level of Care Assessment Items TOOL 4 Quantity Score X- 1 0 Use when only an EandM is performed on FOLLOW-UP visit ASSESSMENTS - Nursing Assessment / Reassessment X- 1 10 Reassessment of Co-morbidities (includes updates in patient status) X- 1 5 Reassessment of Adherence to Treatment Plan ASSESSMENTS - Wound and Skin A ssessment / Reassessment X - Simple Wound Assessment / Reassessment - one wound 1 5 '[]'$  - 0 Complex Wound Assessment / Reassessment - multiple wounds '[]'$  - 0 Dermatologic / Skin Assessment (not related to wound area) ASSESSMENTS - Focused Assessment X- 1 5 Circumferential Edema Measurements - multi extremities '[]'$  - 0 Nutritional Assessment / Counseling / Intervention [name], [name] ([id number]) [id number] Page 2 of 8 X- 1 5 Lower Extremity Assessment (monofilament, tuning fork, pulses) '[]'$  - 0 Peripheral Arterial Disease Assessment (using hand held doppler) ASSESSMENTS - Ostomy and/or Continence Assessment and Care '[]'$  - 0 Incontinence Assessment and Management '[]'$  - 0 Ostomy Care Assessment and Management (repouching, etc.) PROCESS - Coordination of Care X - Simple Patient / Family Education for ongoing care 1 15 '[]'$  - 0 Complex (extensive) Patient / Family Education for ongoing care X- 1 10 Staff obtains Programmer, systems, Records, T Results / Process Orders est '[]'$  - 0 Staff telephones HHA, Nursing Homes / Clarify orders / etc '[]'$  - 0 Routine Transfer to another Facility (non-emergent condition) '[]'$  - 0 Routine Hospital Admission (non-emergent condition) '[]'$  - 0 New Admissions / Biomedical engineer / Ordering NPWT Apligraf, etc. , '[]'$  - 0 Emergency Hospital Admission (emergent condition) X- 1 10 Simple Discharge Coordination '[]'$  - 0 Complex (extensive) Discharge Coordination PROCESS - Special Needs '[]'$  - 0 Pediatric / Minor Patient Management '[]'$  - 0 Isolation Patient Management '[]'$  - 0 Hearing / Language /  Visual special needs '[]'$  - 0 Assessment of Community assistance (transportation, D/C planning, etc.) '[]'$  - 0 Additional assistance / Altered mentation '[]'$  - 0 Support Surface(s) Assessment (bed, cushion, seat, etc.) INTERVENTIONS - Wound Cleansing /  Measurement X - Simple Wound Cleansing - one wound 1 5 '[]'$  - 0 Complex Wound Cleansing - multiple wounds X- 1 5 Wound Imaging (photographs - any number of wounds) '[]'$  - 0 Wound Tracing (instead of photographs) X- 1 5 Simple Wound Measurement - one wound '[]'$  - 0 Complex Wound Measurement - multiple wounds INTERVENTIONS - Wound Dressings '[]'$  - 0 Small Wound Dressing one or multiple wounds '[]'$  - 0 Medium Wound Dressing one or multiple wounds '[]'$  - 0 Large Wound Dressing one or multiple wounds '[]'$  - 0 Application of Medications - topical '[]'$  - 0 Application of Medications - injection INTERVENTIONS - Miscellaneous '[]'$  - 0 External ear exam '[]'$  - 0 Specimen Collection (cultures, biopsies, blood, body fluids, etc.) '[]'$  - 0 Specimen(s) / Culture(s) sent or taken to Lab for analysis '[]'$  - 0 Patient Transfer (multiple staff / Civil Service fast streamer / Similar devices) '[]'$  - 0 Simple Staple / Suture removal (25 or less) '[]'$  - 0 Complex Staple / Suture removal (26 or more) '[]'$  - 0 Hypo / Hyperglycemic Management (close monitor of Blood Glucose) [name], [name] ([id number]) [id number] Page 3 of 8 '[]'$  - 0 Ankle / Brachial Index (ABI) - do not check if billed separately X- 1 5 Vital Signs Has the patient been seen at the hospital within the last three years: Yes Total Score: 85 Level Of Care: New/Established - Level 3 Electronic Signature(s) Signed: 06/13/2022 4:28:24 PM By: Adline Peals Entered By: Adline Peals on 06/13/2022 08:44:37 -------------------------------------------------------------------------------- Encounter Discharge Information Details Patient Name: Date of Service: [name] 06/13/2022 8:15  A M Medical Record Number: [id number] Patient Account Number: [id number] Date of Birth/Sex: Treating RN: February 12, 1961 (61 y.o. [name] Primary Care Dymphna Wadley: [name] Other Clinician: Referring Baltasar Twilley: Treating Lenis Nettleton/Extender: [name] in Treatment: 3 Encounter Discharge Information Items Discharge Condition: Stable Ambulatory Status: Ambulatory Discharge Destination: Home Transportation: Private Auto Accompanied By: self Schedule Follow-up Appointment: Yes Clinical Summary of Care: Patient Declined Electronic Signature(s) Signed: 06/13/2022 4:28:24 PM By: Sabas Sous By: Adline Peals on 06/13/2022 08:45:05 -------------------------------------------------------------------------------- Lower Extremity Assessment Details Patient Name: Date of Service: [name] 06/13/2022 8:15 A M Medical Record Number: [id number] Patient Account Number: [id number] Date of Birth/Sex: Treating RN: 1960-10-07 (61 y.o. [name] Primary Care Armanie Ullmer: [name] Other Clinician: Referring Laurrie Toppin: Treating Aj Crunkleton/Extender: Burley Saver Weeks in Treatment: 3 Edema Assessment Assessed: [Left: No] [Right: No] Edema: [Left: Ye] [Right: s] Calf Left: Right: Point of Measurement: 40 cm From Medial Instep 36.8 cm Ankle Left: Right: Point of Measurement: 10 cm From Medial Instep 24.2 cm Vascular Assessment Left: [[id number] Page 4 of 8Right:] Pulses: Dorsalis Pedis Palpable: [[id number] Page 4 of 8Yes] Electronic Signature(s) Signed: 06/13/2022 4:28:24 PM By: Adline Peals Entered By: Adline Peals on 06/13/2022 08:30:50 -------------------------------------------------------------------------------- Multi Wound Chart Details Patient Name: Date of Service: [name] 06/13/2022 8:15 A M Medical Record  Number: [id number] Patient Account Number: [id number] Date of Birth/Sex: Treating RN: Mar 29, 1961 (61 y.o. F) Primary Care Dalaysia Harms: [name] Other Clinician: Referring Elvy Mclarty: Treating Nilda Keathley/Extender: [name] in Treatment: 3 Vital Signs Height(in): 68 Pulse(bpm): 64 Weight(lbs): 193 Blood Pressure(mmHg): 188/91 Body Mass Index(BMI): 29.3 Temperature(F): 99.2 Respiratory Rate(breaths/min): 18 [21:Photos:] [N/A:N/A] Left, Lateral Lower Leg N/A N/A Wound Location: Gradually Appeared N/A N/A Wounding Event: Venous Leg Ulcer N/A N/A Primary Etiology: Angina, Congestive Heart Failure, N/A N/A Comorbid History: Hypertension, Peripheral Venous Disease 04/28/2022 N/A N/A  Date Acquired: 3 N/A N/A Weeks of Treatment: Open N/A N/A Wound Status: No N/A N/A Wound Recurrence: 0x0x0 N/A N/A Measurements L x W x D (cm) 0 N/A N/A A (cm) : rea 0 N/A N/A Volume (cm) : 100.00% N/A N/A % Reduction in Area: 100.00% N/A N/A % Reduction in Volume: Full Thickness With Exposed Support N/A N/A Classification: Structures None Present N/A N/A Exudate Amount: Flat and Intact N/A N/A Wound Margin: None Present (0%) N/A N/A Granulation Amount: None Present (0%) N/A N/A Necrotic Amount: Fascia: No N/A N/A Exposed Structures: Fat Layer (Subcutaneous Tissue): No Tendon: No Muscle: No Joint: No Bone: No Large (67-100%) N/A N/A Epithelialization: Scarring: Yes N/A N/A Periwound Skin Texture: Dry/Scaly: Yes N/A N/A Periwound Skin Moisture: Hemosiderin Staining: Yes N/A N/A Periwound Skin Color: No Abnormality N/A N/A Temperature: Yes N/A N/A Tenderness on Palpation: [name], [name] ([id number]) [id number] Page 5 of 8 Treatment Notes Electronic Signature(s) Signed: 06/13/2022 8:33:29 AM By: Fredirick Maudlin MD FACS Entered By: Fredirick Maudlin on 06/13/2022  08:33:28 -------------------------------------------------------------------------------- Multi-Disciplinary Care Plan Details Patient Name: Date of Service: [name] 06/13/2022 8:15 A M Medical Record Number: [id number] Patient Account Number: [id number] Date of Birth/Sex: Treating RN: [birth year] (61 y.o. [name] Primary Care Tejay Hubert: [name] Other Clinician: Referring Elo Marmolejos: Treating Tramayne Sebesta/Extender: [name] in Treatment: 3 Multidisciplinary Care Plan reviewed with physician Active Inactive Electronic Signature(s) Signed: 06/13/2022 4:28:24 PM By: Sabas Sous By: Adline Peals on 06/13/2022 08:44:00 -------------------------------------------------------------------------------- Pain Assessment Details Patient Name: Date of Service: [name] 06/13/2022 8:15 A M Medical Record Number: [id number] Patient Account Number: [id number] Date of Birth/Sex: Treating RN: [birth year] (61 y.o. [name] Primary Care Ashden Sonnenberg: [name] Other Clinician: Referring Hildred Pharo: Treating Karis Rilling/Extender: Burley Saver Weeks in Treatment: 3 Active Problems Location of Pain Severity and Description of Pain Patient Has Paino No Site Locations Rate the pain. Current Pain Level: 0 Pain Management and Medication Beauchamp, Lateasha ([id number]) [id number] Page 6 of 8 Current Pain Management: Electronic Signature(s) Signed: 06/13/2022 4:28:24 PM By: Sabas Sous By: Adline Peals on 06/13/2022 08:25:48 -------------------------------------------------------------------------------- Patient/Caregiver Education Details Patient Name: Date of Service: [name] 10/24/2023andnbsp8:15 A M Medical Record Number: [id number] Patient Account Number: [id number] Date of Birth/Gender: Treating RN: 15-Mar-1961 (61 y.o. [name] Primary Care Physician: [name] Other Clinician: Referring Physician: Treating Physician/Extender: [name] in Treatment: 3 Education Assessment Education Provided To: Patient Education Topics Provided Wound/Skin Impairment: Methods: Explain/Verbal Responses: Reinforcements needed, State content correctly Electronic Signature(s) Signed: 06/13/2022 4:28:24 PM By: Adline Peals Entered By: Adline Peals on 06/13/2022 08:44:09 -------------------------------------------------------------------------------- Wound Assessment Details Patient Name: Date of Service: [name] 06/13/2022 8:15 A M Medical Record Number: [id number] Patient Account Number: [id number] Date of Birth/Sex: Treating RN: Jun 30, 1961 (61 y.o. [name] Primary Care Taylee Gunnells: [name] Other Clinician: Referring Vaeda Westall: Treating Layan Zalenski/Extender: Burley Saver Weeks in Treatment: 3 Wound Status Wound Number: [id number] Primary Venous Leg Ulcer Etiology: Wound Location: Left, Lateral Lower Leg Wound Status: Open Wounding Event: Gradually Appeared Comorbid Angina, Congestive Heart Failure, Hypertension, Peripheral Date Acquired: 04/28/2022 History: Venous Disease Weeks Of Treatment: 3 Clustered Wound: No Photos [name], [name] ([id number]) [id number] Page 7 of 8 Wound Measurements Length: (cm) Width: (cm) Depth: (cm) Area: (cm) Volume: (cm) 0 % Reduction in Area: 100% 0 % Reduction in Volume: 100% 0 Epithelialization: Large (67-100%) 0 Tunneling: No 0 Undermining:  No Wound Description Classification: Full Thickness With Exposed Support Structures Wound Margin: Flat and Intact Exudate Amount: None Present Foul Odor After Cleansing: No Slough/Fibrino No Wound Bed Granulation Amount: None Present (0%) Exposed Structure Necrotic Amount: None Present (0%) Fascia Exposed:  No Fat Layer (Subcutaneous Tissue) Exposed: No Tendon Exposed: No Muscle Exposed: No Joint Exposed: No Bone Exposed: No Periwound Skin Texture Texture Color No Abnormalities Noted: No No Abnormalities Noted: Yes Scarring: Yes Temperature / Pain Temperature: No Abnormality Moisture No Abnormalities Noted: No Tenderness on Palpation: Yes Dry / Scaly: Yes Electronic Signature(s) Signed: 06/13/2022 4:28:24 PM By: Adline Peals Signed: 06/15/2022 4:44:37 PM By: Rhae Hammock RN Entered By: Rhae Hammock on 06/13/2022 08:32:12 -------------------------------------------------------------------------------- Vitals Details Patient Name: Date of Service: [name] 06/13/2022 8:15 A M Medical Record Number: [id number] Patient Account Number: [id number] Date of Birth/Sex: Treating RN: March 01, 1961 (61 y.o. [name] Primary Care Tobey Lippard: [name] Other Clinician: Referring Ly Wass: Treating Clairissa Valvano/Extender: [name] in Treatment: 3 Vital Signs Time Taken: 08:25 Temperature (F): 99.2 Height (in): 68 Pulse (bpm): 76 Weight (lbs): 193 Respiratory Rate (breaths/min): 18 Body Mass Index (BMI): 29.3 Blood Pressure (mmHg): 188/91 Reference Range: 80 - 120 mg / dl [name], [name] ([id number]) [id number] Page 8 of 8 Electronic Signature(s) Signed: 06/13/2022 4:28:24 PM By: Adline Peals Entered By: Adline Peals on 06/13/2022 08:25:42

## 2022-08-02 ENCOUNTER — Ambulatory Visit: Payer: Medicare Other | Admitting: Nurse Practitioner

## 2022-08-23 ENCOUNTER — Other Ambulatory Visit: Payer: Self-pay | Admitting: Nurse Practitioner

## 2022-08-23 DIAGNOSIS — I119 Hypertensive heart disease without heart failure: Secondary | ICD-10-CM

## 2022-09-12 ENCOUNTER — Other Ambulatory Visit: Payer: Self-pay

## 2022-09-12 DIAGNOSIS — E782 Mixed hyperlipidemia: Secondary | ICD-10-CM

## 2022-09-12 MED ORDER — ATORVASTATIN CALCIUM 40 MG PO TABS: 40.0000 mg | ORAL_TABLET | Freq: Every day | ORAL | 1 refills | Status: DC

## 2022-09-14 ENCOUNTER — Telehealth: Payer: Self-pay

## 2022-09-14 ENCOUNTER — Ambulatory Visit: Payer: 59

## 2022-09-14 ENCOUNTER — Ambulatory Visit: Payer: Medicare Other | Admitting: Nurse Practitioner

## 2022-09-14 NOTE — Telephone Encounter (Signed)
This nurse attempted to call patient for [facility]. Electronic message stating that person being called can not accept calls at this time.

## 2022-09-21 ENCOUNTER — Encounter: Payer: Self-pay | Admitting: Nurse Practitioner

## 2022-09-21 ENCOUNTER — Ambulatory Visit (INDEPENDENT_AMBULATORY_CARE_PROVIDER_SITE_OTHER): Payer: 59 | Admitting: Nurse Practitioner

## 2022-09-21 VITALS — BP 160/100 | HR 78 | Temp 98.7°F | Ht 68.0 in | Wt 199.0 lb

## 2022-09-21 DIAGNOSIS — E782 Mixed hyperlipidemia: Secondary | ICD-10-CM | POA: Diagnosis not present

## 2022-09-21 DIAGNOSIS — I129 Hypertensive chronic kidney disease with stage 1 through stage 4 chronic kidney disease, or unspecified chronic kidney disease: Secondary | ICD-10-CM

## 2022-09-21 DIAGNOSIS — N1832 Chronic kidney disease, stage 3b: Secondary | ICD-10-CM | POA: Insufficient documentation

## 2022-09-21 DIAGNOSIS — R202 Paresthesia of skin: Secondary | ICD-10-CM | POA: Diagnosis not present

## 2022-09-21 DIAGNOSIS — I739 Peripheral vascular disease, unspecified: Secondary | ICD-10-CM | POA: Diagnosis not present

## 2022-09-21 DIAGNOSIS — N1831 Chronic kidney disease, stage 3a: Secondary | ICD-10-CM

## 2022-09-21 DIAGNOSIS — Z1211 Encounter for screening for malignant neoplasm of colon: Secondary | ICD-10-CM

## 2022-09-21 DIAGNOSIS — I152 Hypertension secondary to endocrine disorders: Secondary | ICD-10-CM

## 2022-09-21 DIAGNOSIS — H538 Other visual disturbances: Secondary | ICD-10-CM

## 2022-09-21 DIAGNOSIS — F1999 Other psychoactive substance use, unspecified with unspecified psychoactive substance-induced disorder: Secondary | ICD-10-CM

## 2022-09-21 DIAGNOSIS — R200 Anesthesia of skin: Secondary | ICD-10-CM

## 2022-09-21 DIAGNOSIS — Z23 Encounter for immunization: Secondary | ICD-10-CM | POA: Diagnosis not present

## 2022-09-21 MED ORDER — GABAPENTIN 100 MG PO CAPS: 200.0000 mg | ORAL_CAPSULE | Freq: Two times a day (BID) | ORAL | 5 refills | Status: DC

## 2022-09-21 MED ORDER — FARXIGA 10 MG PO TABS: 10.0000 mg | ORAL_TABLET | Freq: Every day | ORAL | 0 refills | Status: DC

## 2022-09-21 NOTE — Progress Notes (Addendum)
I,[name],acting as a Education administrator for [employer], FNP.,have documented all relevant documentation on the behalf of [name], FNP,as directed by  [name], FNP while in the presence of [name], [place].  Subjective:     Patient ID: [name] , female    DOB: 13-Feb-1961 , 62 y.o.   MRN: [id number]   Chief Complaint  Patient presents with   Hypertension    HPI  Patient presents today for HTN follow up.   BP Readings from Last 3 Encounters: 09/21/22 : (!) 160/100 05/02/22 : (!) 168/98 04/08/22 : (!) 215/92    Hypertension This is a chronic problem. The current episode started more than 1 year ago. The problem has been gradually worsening since onset. The problem is uncontrolled. Pertinent negatives include no anxiety, blurred vision, chest pain, headaches, palpitations or peripheral edema. Risk factors for coronary artery disease include obesity and sedentary lifestyle. Past treatments include angiotensin blockers, diuretics and calcium channel blockers. There are no compliance problems.  Hypertensive end-organ damage includes kidney disease and PVD. There is no history of angina. There is no history of chronic renal disease or sleep apnea.     Past Medical History:  Diagnosis Date   Adrenal adenoma 01/11/2021   Anemia    CHF (congestive heart failure) (HCC)    Chronic bronchitis ([place])    "probably once/yr" ([id number])   Diastolic heart failure    Dyslipidemia    pt denies this hx on 05/12/2013   Exertional shortness of breath    GERD (gastroesophageal reflux disease)    Hypertension    Hypokalemia    Left ventricular hypertrophy 01/11/2021   Venous stasis ulcers ([place])      Family History  Problem Relation Age of Onset   Hypertension Mother    Kidney failure Mother    Heart attack Father    Hypertension Sister    Diabetes Sister    Hypertension Brother    Diabetes Sister      Current Outpatient Medications:    acetaminophen (TYLENOL) 325 MG  tablet, Take 650 mg by mouth every 6 (six) hours as needed for mild pain or headache., Disp: , Rfl:    amLODipine (NORVASC) 10 MG tablet, TAKE 1 TABLET(10 MG) BY MOUTH DAILY, Disp: 90 tablet, Rfl: 0   aspirin 81 MG chewable tablet, Chew 81 mg by mouth daily., Disp: , Rfl:    atorvastatin (LIPITOR) 40 MG tablet, Take 1 tablet (40 mg total) by mouth daily., Disp: 90 tablet, Rfl: 1   spironolactone (ALDACTONE) 25 MG tablet, Take 25 mg by mouth daily., Disp: , Rfl:    telmisartan (MICARDIS) 80 MG tablet, Take 80 mg by mouth daily., Disp: , Rfl:    FARXIGA 10 MG TABS tablet, Take 1 tablet (10 mg total) by mouth daily., Disp: 90 tablet, Rfl: 0   gabapentin (NEURONTIN) 100 MG capsule, Take 2 capsules (200 mg total) by mouth 2 (two) times daily., Disp: 120 capsule, Rfl: 5   Allergies  Allergen Reactions   Lisinopril Cough     Review of Systems  Constitutional: Negative.   Eyes:  Negative for blurred vision.  Respiratory: Negative.    Cardiovascular: Negative.  Negative for chest pain, palpitations and leg swelling.  Gastrointestinal: Negative.   Neurological: Negative.  Negative for headaches.  Psychiatric/Behavioral: Negative.       Today's Vitals   09/21/22 1217  BP: (!) 160/100  Pulse: 78  Temp: 98.7 F (37.1 C)  TempSrc: Oral  Weight: 199 lb (  90.3 kg)  Height: '5\' 8"'$  (1.727 m)   Body mass index is 30.26 kg/m.   Objective:  Physical Exam Vitals reviewed.  Constitutional:      Appearance: Normal appearance.  Cardiovascular:     Rate and Rhythm: Normal rate and regular rhythm.     Pulses: Normal pulses.     Heart sounds: Normal heart sounds. No murmur heard. Pulmonary:     Effort: Pulmonary effort is normal. No respiratory distress.     Breath sounds: Normal breath sounds. No stridor. No wheezing.  Musculoskeletal:        General: Swelling present. No tenderness.  Skin:    General: Skin is warm and dry.     Comments: Slightly open scaly wound.   Neurological:      General: No focal deficit present.     Mental Status: She is alert and oriented to person, place, and time.  Psychiatric:        Mood and Affect: Mood normal.        Behavior: Behavior normal.        Thought Content: Thought content normal.        Judgment: Judgment normal.         Assessment And Plan:     1. Hypertension due to endocrine disorder Comments: Blood pressure is elevated, she is not taking her medications. Discussed importance of taking her medications as directed. - FARXIGA 10 MG TABS tablet; Take 1 tablet (10 mg total) by mouth daily.  Dispense: 90 tablet; Refill: 0 - AMB Referral to [place] (ACO Patients) - AMB Referral to Chronic Care Management Services - BMP8+eGFR  2. Stage 3a chronic kidney disease ([place]) Being followed by [facility], thought to be related to HTN with possible chronic NSAID/cocaine induced injury. Was on Farxiga for kidney protection  3. Mixed hyperlipidemia - AMB Referral to [place] (ACO Patients) - AMB Referral to Chronic Care Management Services - Lipid panel  4. Claudication, intermittent ([place]) Seen by Vascular and ABI did not show any significant peripheral arterial disease. Venous reflux study shows short segment saphenous vein reflux, advised to wear compression and elevate lower extremities. Thought to be related to neuropathy or spinal stenosis.   5. Colon cancer screening According to USPTF Colorectal cancer Screening guidelines. Colonoscopy is recommended every 3 years, starting at age 61 years. Will send order to [facility]  6. Numbness and tingling of both lower extremities Comments: Will treat with gabapentin, and consider doing an EMG/NCV or lower back MRI.  - gabapentin (NEURONTIN) 100 MG capsule; Take 2 capsules (200 mg total) by mouth 2 (two) times daily.  Dispense: 120 capsule; Refill: 5  7. Blurred vision, bilateral - Ambulatory referral to Ophthalmology  8.  Need for Tdap vaccination Will give tetanus vaccine today while in office. Refer to order management. TDAP will be administered to adults 26-42 years old every 10 years. - Tdap vaccine greater than or equal to 7yo IM     Patient was given opportunity to ask questions. Patient verbalized understanding of the plan and was able to repeat key elements of the plan. All questions were answered to their satisfaction.  [name], FNP    I, [name], FNP, have reviewed all documentation for this visit. The documentation on 09/21/22 for the exam, diagnosis, procedures, and orders are all accurate and complete.  IF YOU HAVE BEEN REFERRED TO A SPECIALIST, IT MAY TAKE 1-2 WEEKS TO SCHEDULE/PROCESS THE REFERRAL. IF YOU  HAVE NOT HEARD FROM US/SPECIALIST IN TWO WEEKS, PLEASE GIVE [place] A CALL AT [phone].   THE PATIENT IS ENCOURAGED TO PRACTICE SOCIAL DISTANCING DUE TO THE COVID-19 PANDEMIC.

## 2022-09-21 NOTE — Patient Instructions (Signed)
Hypertension, Adult High blood pressure (hypertension) is when the force of blood pumping through the arteries is too strong. The arteries are the blood vessels that carry blood from the heart throughout the body. Hypertension forces the heart to work harder to pump blood and may cause arteries to become narrow or stiff. Untreated or uncontrolled hypertension can lead to a heart attack, heart failure, a stroke, kidney disease, and other problems. A blood pressure reading consists of a higher number over a lower number. Ideally, your blood pressure should be below 120/80. The first ("top") number is called the systolic pressure. It is a measure of the pressure in your arteries as your heart beats. The second ("bottom") number is called the diastolic pressure. It is a measure of the pressure in your arteries as the heart relaxes. What are the causes? The exact cause of this condition is not known. There are some conditions that result in high blood pressure. What increases the risk? Certain factors may make you more likely to develop high blood pressure. Some of these risk factors are under your control, including: Smoking. Not getting enough exercise or physical activity. Being overweight. Having too much fat, sugar, calories, or salt (sodium) in your diet. Drinking too much alcohol. Other risk factors include: Having a personal history of heart disease, diabetes, high cholesterol, or kidney disease. Stress. Having a family history of high blood pressure and high cholesterol. Having obstructive sleep apnea. Age. The risk increases with age. What are the signs or symptoms? High blood pressure may not cause symptoms. Very high blood pressure (hypertensive crisis) may cause: Headache. Fast or irregular heartbeats (palpitations). Shortness of breath. Nosebleed. Nausea and vomiting. Vision changes. Severe chest pain, dizziness, and seizures. How is this diagnosed? This condition is diagnosed by  measuring your blood pressure while you are seated, with your arm resting on a flat surface, your legs uncrossed, and your feet flat on the floor. The cuff of the blood pressure monitor will be placed directly against the skin of your upper arm at the level of your heart. Blood pressure should be measured at least twice using the same arm. Certain conditions can cause a difference in blood pressure between your right and left arms. If you have a high blood pressure reading during one visit or you have normal blood pressure with other risk factors, you may be asked to: Return on a different day to have your blood pressure checked again. Monitor your blood pressure at home for 1 week or longer. If you are diagnosed with hypertension, you may have other blood or imaging tests to help your health care provider understand your overall risk for other conditions. How is this treated? This condition is treated by making healthy lifestyle changes, such as eating healthy foods, exercising more, and reducing your alcohol intake. You may be referred for counseling on a healthy diet and physical activity. Your health care provider may prescribe medicine if lifestyle changes are not enough to get your blood pressure under control and if: Your systolic blood pressure is above 130. Your diastolic blood pressure is above 80. Your personal target blood pressure may vary depending on your medical conditions, your age, and other factors. Follow these instructions at home: Eating and drinking  Eat a diet that is high in fiber and potassium, and low in sodium, added sugar, and fat. An example of this eating plan is called the DASH diet. DASH stands for Dietary Approaches to Stop Hypertension. To eat this way: Eat   plenty of fresh fruits and vegetables. Try to fill one half of your plate at each meal with fruits and vegetables. Eat whole grains, such as whole-wheat pasta, brown rice, or whole-grain bread. Fill about one  fourth of your plate with whole grains. Eat or drink low-fat dairy products, such as skim milk or low-fat yogurt. Avoid fatty cuts of meat, processed or cured meats, and poultry with skin. Fill about one fourth of your plate with lean proteins, such as fish, chicken without skin, beans, eggs, or tofu. Avoid pre-made and processed foods. These tend to be higher in sodium, added sugar, and fat. Reduce your daily sodium intake. Many people with hypertension should eat less than 1,500 mg of sodium a day. Do not drink alcohol if: Your health care provider tells you not to drink. You are pregnant, may be pregnant, or are planning to become pregnant. If you drink alcohol: Limit how much you have to: 0-1 drink a day for women. 0-2 drinks a day for men. Know how much alcohol is in your drink. In the [place] one drink equals one 12 oz bottle of beer (355 mL), one 5 oz glass of wine (148 mL), or one 1 oz glass of hard liquor (44 mL). Lifestyle  Work with your health care provider to maintain a healthy body weight or to lose weight. Ask what an ideal weight is for you. Get at least 30 minutes of exercise that causes your heart to beat faster (aerobic exercise) most days of the week. Activities may include walking, swimming, or biking. Include exercise to strengthen your muscles (resistance exercise), such as Pilates or lifting weights, as part of your weekly exercise routine. Try to do these types of exercises for 30 minutes at least 3 days a week. Do not use any products that contain nicotine or tobacco. These products include cigarettes, chewing tobacco, and vaping devices, such as e-cigarettes. If you need help quitting, ask your health care provider. Monitor your blood pressure at home as told by your health care provider. Keep all follow-up visits. This is important. Medicines Take over-the-counter and prescription medicines only as told by your health care provider. Follow directions carefully. Blood  pressure medicines must be taken as prescribed. Do not skip doses of blood pressure medicine. Doing this puts you at risk for problems and can make the medicine less effective. Ask your health care provider about side effects or reactions to medicines that you should watch for. Contact a health care provider if you: Think you are having a reaction to a medicine you are taking. Have headaches that keep coming back (recurring). Feel dizzy. Have swelling in your ankles. Have trouble with your vision. Get help right away if you: Develop a severe headache or confusion. Have unusual weakness or numbness. Feel faint. Have severe pain in your chest or abdomen. Vomit repeatedly. Have trouble breathing. These symptoms may be an emergency. Get help right away. Call 911. Do not wait to see if the symptoms will go away. Do not drive yourself to the hospital. Summary Hypertension is when the force of blood pumping through your arteries is too strong. If this condition is not controlled, it may put you at risk for serious complications. Your personal target blood pressure may vary depending on your medical conditions, your age, and other factors. For most people, a normal blood pressure is less than 120/80. Hypertension is treated with lifestyle changes, medicines, or a combination of both. Lifestyle changes include losing weight, eating a healthy,   low-sodium diet, exercising more, and limiting alcohol. This information is not intended to replace advice given to you by your health care provider. Make sure you discuss any questions you have with your health care provider. Document Revised: 06/14/2021 Document Reviewed: 06/14/2021 Elsevier Patient Education  2023 Elsevier Inc.  

## 2022-09-22 LAB — BMP8+EGFR
BUN/Creatinine Ratio: 20 (ref 12–28)
BUN: 39 mg/dL — ABNORMAL HIGH (ref 8–27)
CO2: 21 mmol/L (ref 20–29)
Calcium: 8.9 mg/dL (ref 8.7–10.3)
Chloride: 105 mmol/L (ref 96–106)
Creatinine, Ser: 1.93 mg/dL — ABNORMAL HIGH (ref 0.57–1.00)
Glucose: 106 mg/dL — ABNORMAL HIGH (ref 70–99)
Potassium: 4.7 mmol/L (ref 3.5–5.2)
Sodium: 139 mmol/L (ref 134–144)
eGFR: 29 mL/min/{1.73_m2} — ABNORMAL LOW (ref >59)

## 2022-09-22 LAB — LIPID PANEL
Chol/HDL Ratio: 2.8 ratio (ref 0.0–4.4)
Cholesterol, Total: 202 mg/dL — ABNORMAL HIGH (ref 100–199)
HDL: 73 mg/dL (ref >39)
LDL Chol Calc (NIH): 110 mg/dL — ABNORMAL HIGH (ref 0–99)
Triglycerides: 109 mg/dL (ref 0–149)
VLDL Cholesterol Cal: 19 mg/dL (ref 5–40)

## 2022-09-26 ENCOUNTER — Telehealth: Payer: Self-pay | Admitting: *Deleted

## 2022-09-26 NOTE — Progress Notes (Signed)
  Care Coordination   Note   09/26/2022 Name: [name] MRN: [id number] DOB: 1961/05/25  [name] is a 62 y.o. year old female who sees [name], [place] for primary care. I reached out to [facility]: [facility] Rhonda W by phone today to offer care coordination services.  [name] was given information about Care Coordination services today including:   The Care Coordination services include support from the care team which includes your Nurse Coordinator, Clinical Social Worker, or Pharmacist.  The Care Coordination team is here to help remove barriers to the health concerns and goals most important to you. Care Coordination services are voluntary, and the patient may decline or stop services at any time by request to their care team member.   Care Coordination Consent Status: Patient agreed to services and verbal consent obtained.   Follow up plan:  Telephone appointment with care coordination team member scheduled for:  SW 09/27/22 and [facility] 10/06/22  Encounter Outcome:  Pt. Scheduled   [place]  Direct Dial: [phone]

## 2022-09-27 ENCOUNTER — Telehealth: Payer: Self-pay

## 2022-09-27 ENCOUNTER — Ambulatory Visit: Payer: Self-pay

## 2022-09-27 NOTE — Progress Notes (Signed)
  Chronic Care Management   Note  09/27/2022 Name: [name] MRN: [id number] DOB: 31-Aug-1960  [name] is a 62 y.o. year old female who is a primary care patient of [name], [place]. I reached out to [facility] by phone today in response to a referral sent by [name]'s PCP.  [name] was given information about Chronic Care Management services today including:  CCM service includes personalized support from designated clinical staff supervised by the physician, including individualized plan of care and coordination with other care providers 24/7 contact phone numbers for assistance for urgent and routine care needs. Service will only be billed when office clinical staff spend 20 minutes or more in a month to coordinate care. Only one practitioner may furnish and bill the service in a calendar month. The patient may stop CCM services at amy time (effective at the end of the month) by phone call to the office staff. The patient will be responsible for cost sharing (co-pay) or up to 20% of the service fee (after annual deductible is met)  [name]  agreedto scheduling an appointment with the CCM RN Case Manager   Follow up plan: Patient agreed to scheduled appointment with RN Case Manager on 10/06/2022(date/time).   Noreene Larsson, [place], [place] [address] Direct Dial: [phone] [email or web address]

## 2022-09-27 NOTE — Patient Outreach (Signed)
  Care Coordination   Initial Visit Note   09/27/2022 Name: [name] MRN: [id number] DOB: [birth year]  [name] is a 62 y.o. year old female who sees [name], [place] for primary care. I spoke with  [name] by phone today.  What matters to the patients health and wellness today?  I need resources to pay my hotel bill    Goals Addressed             This Visit's Progress    Care Coordination Activities       Care Coordination Interventions: Determined the patient has been living in a hotel room for the past 3 years and has been told she owes $1400 and must pay by Friday 2/9 or she will have to move out Patient indicates she does not owe money and feels they have her mixed up with someone else Discussed patient works part time 3 days a week and will begin receiving social security next month when she turns 60; she has already been in contact with social security Determined the patient plans to visit [name] tomorrow morning to determine if funds are available to assist with payment due Provided patient with contact number to [name] program [phone]) advising the patient to contact the program for assistance with repaid re-housing assistance         SDOH assessments and interventions completed:  Yes  SDOH Interventions Today    Flowsheet Row Most Recent Value  SDOH Interventions   Food Insecurity Interventions Intervention Not Indicated  Housing Interventions Other (Comment)  [name], [name]]  Utilities Interventions Intervention Not Indicated        Care Coordination Interventions:  Yes, provided   Interventions Today    Flowsheet Row Most Recent Value  Chronic Disease Discussed/Reviewed   Chronic disease discussed/reviewed during today's visit Congestive Heart Failure (CHF), Hypertension (HTN), Chronic Kidney Disease/End Stage Renal Disease (ESRD)  General  Interventions   General Interventions Discussed/Reviewed General Interventions Discussed, Community Resources        Follow up plan: Follow up call scheduled for 09/28/22    Encounter Outcome:  Pt. Visit Completed   [name], Arita Miss, CDP Social Worker, Certified Dementia Practitioner [facility] [place] [facility]  Care Coordination [phone]

## 2022-09-27 NOTE — Progress Notes (Signed)
   Care Guide Note  09/27/2022 Name: [name] MRN: [id number] DOB: [birth year]  Referred by: [name], FNP Reason for referral : Chronic Care Management (Outreach to schedule referral )   [name] is a 62 y.o. year old female who is a primary care patient of [name], [place]. [name] was referred to the pharmacist for assistance related to DM.    Successful contact was made with the patient to discuss pharmacy services including being ready for the pharmacist to call at least 5 minutes before the scheduled appointment time, to have medication bottles and any blood sugar or blood pressure readings ready for review. The patient agreed to meet with the pharmacist via with the pharmacist via telephone visit on (date/time).  10/19/2022   Noreene Larsson, [place],  [address] Direct Dial: [phone] [email or web address]

## 2022-09-27 NOTE — Patient Instructions (Signed)
Visit Information  Thank you for taking time to visit with me today. Please don't hesitate to contact me if I can be of assistance to you.   Following are the goals we discussed today:   Goals Addressed             This Visit's Progress    Care Coordination Activities       Care Coordination Interventions: Determined the patient has been living in a hotel room for the past 3 years and has been told she owes $1400 and must pay by Friday 2/9 or she will have to move out Patient indicates she does not owe money and feels they have her mixed up with someone else Discussed patient works part time 3 days a week and will begin receiving social security next month when she turns 75; she has already been in contact with social security Determined the patient plans to visit [employer] tomorrow morning to determine if funds are available to assist with payment due Provided patient with contact number to [employer] program [phone]) advising the patient to contact the program for assistance with repaid re-housing assistance         Our next appointment is by telephone on 2/8  Please call the care guide team at [phone] if you need to cancel or reschedule your appointment.   If you are experiencing a Mental Health or [place] or need someone to talk to, please call 911  The patient verbalized understanding of instructions, educational materials, and care plan provided today and DECLINED offer to receive copy of patient instructions, educational materials, and care plan.   Telephone follow up appointment with care management team member scheduled for:2/8  Daneen Schick, Arita Miss, CDP Social Worker, Certified Dementia Practitioner [place] [facility]  Care Coordination [phone]

## 2022-09-28 ENCOUNTER — Telehealth: Payer: Self-pay

## 2022-09-28 NOTE — Patient Outreach (Signed)
  Care Coordination   09/28/2022 Name: [name] MRN: [id number] DOB: 01/29/1961   Care Coordination Outreach Attempts:  An unsuccessful telephone outreach was attempted for a scheduled appointment today.  Follow Up Plan:  Additional outreach attempts will be made to offer the patient care coordination information and services.   Encounter Outcome:  No Answer   Care Coordination Interventions:  No, not indicated    Daneen Schick, BSW, CDP Social Worker, Certified Dementia Practitioner [facility] [facility]  Care Coordination [phone]

## 2022-10-02 ENCOUNTER — Ambulatory Visit: Payer: Self-pay

## 2022-10-02 NOTE — Patient Outreach (Signed)
  Care Coordination   Follow Up Visit Note   10/02/2022 Name: [name] MRN: [id number] DOB: 1960/08/23  [name] is a 62 y.o. year old female who sees [name], [place] for primary care. I spoke with  [name] by phone today.  What matters to the patients health and wellness today?  Find an apartment    Goals Addressed             This Visit's Progress    Care Coordination Activities       Care Coordination Interventions: Discussed patient has not yet moved out of hotel; plans to go back to [name] patient to also visit [name] for assistance from homelessness prevention case management team Encouraged patient to contact SW if needed prior to next outreach         SDOH assessments and interventions completed:  No     Care Coordination Interventions:  Yes, provided   Interventions Today    Flowsheet Row Most Recent Value  Chronic Disease Discussed/Reviewed   Chronic disease discussed/reviewed during today's visit Hypertension (HTN)  General Interventions   General Interventions Discussed/Reviewed General Interventions Reviewed, Community Resources       Follow up plan: Follow up call scheduled for 2/22    Encounter Outcome:  Pt. Visit Completed   Daneen Schick, Arita Miss, CDP Social Worker, Certified Dementia Practitioner [place] [facility]  Care Coordination [phone]

## 2022-10-02 NOTE — Patient Instructions (Signed)
Visit Information  Thank you for taking time to visit with me today. Please don't hesitate to contact me if I can be of assistance to you.   Following are the goals we discussed today:   Goals Addressed             This Visit's Progress    Care Coordination Activities       Care Coordination Interventions: Discussed patient has not yet moved out of hotel; plans to go back to [place] patient to also visit [employer] for assistance from homelessness prevention case management team Encouraged patient to contact SW if needed prior to next outreach         Our next appointment is by telephone on 2/22 at 11:00  Please call the care guide team at [phone] if you need to cancel or reschedule your appointment.   If you are experiencing a Mental Health or [place] or need someone to talk to, please call 911  The patient verbalized understanding of instructions, educational materials, and care plan provided today and DECLINED offer to receive copy of patient instructions, educational materials, and care plan.   Telephone follow up appointment with care management team member scheduled for:2/22  Daneen Schick, Arita Miss, CDP Social Worker, Certified Dementia Practitioner [facility] Care Management  Care Coordination [phone]

## 2022-10-03 DIAGNOSIS — I129 Hypertensive chronic kidney disease with stage 1 through stage 4 chronic kidney disease, or unspecified chronic kidney disease: Secondary | ICD-10-CM | POA: Diagnosis not present

## 2022-10-03 DIAGNOSIS — N1832 Chronic kidney disease, stage 3b: Secondary | ICD-10-CM | POA: Diagnosis not present

## 2022-10-03 DIAGNOSIS — D631 Anemia in chronic kidney disease: Secondary | ICD-10-CM | POA: Diagnosis not present

## 2022-10-03 DIAGNOSIS — R809 Proteinuria, unspecified: Secondary | ICD-10-CM | POA: Diagnosis not present

## 2022-10-03 DIAGNOSIS — N2581 Secondary hyperparathyroidism of renal origin: Secondary | ICD-10-CM | POA: Diagnosis not present

## 2022-10-03 DIAGNOSIS — E785 Hyperlipidemia, unspecified: Secondary | ICD-10-CM | POA: Diagnosis not present

## 2022-10-03 DIAGNOSIS — N184 Chronic kidney disease, stage 4 (severe): Secondary | ICD-10-CM | POA: Diagnosis not present

## 2022-10-03 DIAGNOSIS — E269 Hyperaldosteronism, unspecified: Secondary | ICD-10-CM | POA: Diagnosis not present

## 2022-10-04 ENCOUNTER — Ambulatory Visit (INDEPENDENT_AMBULATORY_CARE_PROVIDER_SITE_OTHER): Payer: 59

## 2022-10-04 VITALS — Ht 68.0 in | Wt 210.0 lb

## 2022-10-04 DIAGNOSIS — Z0000 Encounter for general adult medical examination without abnormal findings: Secondary | ICD-10-CM

## 2022-10-04 NOTE — Progress Notes (Signed)
I connected with  Melford Aase on 10/04/22 by a audio enabled telemedicine application and verified that I am speaking with the correct person using two identifiers.  Patient Location: Home  Provider Location: Office/Clinic  I discussed the limitations of evaluation and management by telemedicine. The patient expressed understanding and agreed to proceed.  Subjective:   [name] is a 62 y.o. female who presents for Medicare Annual (Subsequent) preventive examination.  Review of Systems     Cardiac Risk Factors include: advanced age (>72mn, >>64women);dyslipidemia;hypertension;obesity (BMI >30kg/m2);smoking/ tobacco exposure     Objective:    Today's Vitals   10/04/22 1510 10/04/22 1511  Weight: 210 lb (95.3 kg)   Height: 5' 8"$  (1.727 m)   PainSc:  8    Body mass index is 31.93 kg/m.     10/04/2022    3:16 PM 09/08/2021    3:27 PM 12/04/2020    7:11 AM 11/17/2020   12:05 PM 09/05/2018   11:00 PM 09/05/2018    1:09 PM 10/17/2017    1:51 PM  Advanced Directives  Does Patient Have a Medical Advance Directive? No No No No No No No  Would patient like information on creating a medical advance directive?     No - Patient declined No - Patient declined Yes (MAU/Ambulatory/Procedural Areas - Information given)    Current Medications (verified) Outpatient Encounter Medications as of 10/04/2022  Medication Sig   acetaminophen (TYLENOL) 325 MG tablet Take 650 mg by mouth every 6 (six) hours as needed for mild pain or headache.   amLODipine (NORVASC) 10 MG tablet TAKE 1 TABLET(10 MG) BY MOUTH DAILY   aspirin 81 MG chewable tablet Chew 81 mg by mouth daily.   atorvastatin (LIPITOR) 40 MG tablet Take 1 tablet (40 mg total) by mouth daily.   FARXIGA 10 MG TABS tablet Take 1 tablet (10 mg total) by mouth daily.   spironolactone (ALDACTONE) 25 MG tablet Take 25 mg by mouth daily.   telmisartan (MICARDIS) 80 MG tablet Take 80 mg by mouth daily.   gabapentin (NEURONTIN) 100 MG  capsule Take 2 capsules (200 mg total) by mouth 2 (two) times daily. (Patient not taking: Reported on 10/04/2022)   No facility-administered encounter medications on file as of 10/04/2022.    Allergies (verified) Lisinopril   History: Past Medical History:  Diagnosis Date   Adrenal adenoma 01/11/2021   Anemia    CHF (congestive heart failure) (HCC)    Chronic bronchitis ([place]    "probably once/yr" ([id number]   Diastolic heart failure    Dyslipidemia    pt denies this hx on 05/12/2013   Exertional shortness of breath    GERD (gastroesophageal reflux disease)    Hypertension    Hypokalemia    Left ventricular hypertrophy 01/11/2021   Venous stasis ulcers (HCC)    Past Surgical History:  Procedure Laterality Date   IR [place] VASC ACCESS RIGHT  02/27/2017   IR VENOGRAM ADRENAL BI  02/27/2017   IR VENOGRAM HEPATIC WO HEMODYNAMIC EVALUATION  02/27/2017   IR VENOGRAM RENAL BI  02/27/2017   IR VENOUS SAMPLING  02/27/2017   IR VENOUS SAMPLING  02/27/2017   VAGINAL HYSTERECTOMY  1990's   Family History  Problem Relation Age of Onset   Hypertension Mother    Kidney failure Mother    Heart attack Father    Hypertension Sister    Diabetes Sister    Hypertension Brother    Diabetes Sister    Social History  Socioeconomic History   Marital status: Single    Spouse name: Not on file   Number of children: Not on file   Years of education: Not on file   Highest education level: Not on file  Occupational History   Occupation: disability  Tobacco Use   Smoking status: Every Day    Packs/day: 0.50    Years: 10.00    Total pack years: 5.00    Types: Cigarettes   Smokeless tobacco: Never   Tobacco comments:    trying to cut back, 3/29 - only had one cigarette today, average 4 a day  Vaping Use   Vaping Use: Never used  Substance and Sexual Activity   Alcohol use: Not Currently    Alcohol/week: 7.0 standard drinks of alcohol    Types: 7 Cans of beer per week    Comment: Beer  once a week.   Drug use: No    Types: Cocaine    Comment: 05/12/2013 "tried it a few days ago; didn't like it"   Sexual activity: Not Currently  Other Topics Concern   Not on file  Social History Narrative   Not on file   Social Determinants of Health   Financial Resource Strain: Low Risk  (10/04/2022)   Overall Financial Resource Strain (CARDIA)    Difficulty of Paying Living Expenses: Not hard at all  Food Insecurity: No Food Insecurity (10/04/2022)   Hunger Vital Sign    Worried About Running Out of Food in the Last Year: Never true    [place] in the Last Year: Never true  Transportation Needs: No Transportation Needs (10/04/2022)   PRAPARE - Hydrologist (Medical): No    Lack of Transportation (Non-Medical): No  Physical Activity: Inactive (10/04/2022)   Exercise Vital Sign    Days of Exercise per Week: 0 days    Minutes of Exercise per Session: 0 min  Stress: No Stress Concern Present (10/04/2022)   [place]    Feeling of Stress : Not at all  Social Connections: Not on file    Tobacco Counseling Ready to quit: Yes Counseling given: Not Answered Tobacco comments: trying to cut back, 3/29 - only had one cigarette today, average 4 a day   Clinical Intake:  Pre-visit preparation completed: Yes  Pain : 0-10 Pain Score: 8  Pain Type: Chronic pain Pain Location: Leg Pain Orientation: Left, Right Pain Descriptors / Indicators: Throbbing Pain Onset: More than a month ago Pain Frequency: Constant     Nutritional Status: BMI > 30  Obese Nutritional Risks: None Diabetes: No  How often do you need to have someone help you when you read instructions, pamphlets, or other written materials from your doctor or pharmacy?: 1 - Never  Diabetic? no  Interpreter Needed?: No  Information entered by :: NAllen LPN   Activities of Daily Living    10/04/2022    3:17 PM  In  your present state of health, do you have any difficulty performing the following activities:  Hearing? 0  Vision? 1  Comment hard seeing close up  Difficulty concentrating or making decisions? 0  Walking or climbing stairs? 0  Dressing or bathing? 0  Doing errands, shopping? 0  Preparing Food and eating ? N  Using the Toilet? N  In the past six months, have you accidently leaked urine? N  Do you have problems with loss of bowel control? N  Managing your Medications? N  Managing your Finances? N  Housekeeping or managing your Housekeeping? N    Patient Care Team: [name], FNP as PCP - General ([place]) Skeet Latch, MD as PCP - Cardiology (Cardiology) Christin Fudge, MD (Inactive) as Consulting Physician (Surgery) Rex Kras Claudette Stapler, RN as [place] as [place] any recent [place] you may have received from other than [facility] providers in the past year (date may be approximate).     Assessment:   This is a routine wellness examination for [place].  Hearing/Vision screen Vision Screening - Comments:: No regular eye exams,  Dietary issues and exercise activities discussed: Current Exercise Habits: The patient does not participate in regular exercise at present   Goals Addressed             This Visit's Progress    Patient Stated       10/04/2022, wants to get kidneys under control       Depression Screen    10/04/2022    3:17 PM 09/21/2022   12:15 PM 05/02/2022   11:06 AM 09/08/2021    3:28 PM 11/17/2020   12:07 PM 10/11/2020    2:16 PM 02/27/2019   10:28 AM  PHQ 2/9 Scores  PHQ - 2 Score 0 0 0 0 1 0 0    Fall Risk    10/04/2022    3:16 PM 09/21/2022   12:15 PM 05/02/2022   11:06 AM 09/08/2021    3:28 PM 11/17/2020   12:06 PM  [place] in the past year? 1 0 0 0 0  Comment legs gave out      Number falls in past yr: 0 0 0    Injury with Fall? 0 0  0    Risk for fall due to : Medication side effect No Fall Risks No Fall Risks Medication side effect Medication side effect  Follow up Falls prevention discussed;Education provided;Falls evaluation completed Falls evaluation completed Falls evaluation completed Falls evaluation completed;Education provided;Falls prevention discussed Falls evaluation completed;Education provided;Falls prevention discussed    FALL RISK PREVENTION PERTAINING TO THE HOME:  Any stairs in or around the home? No  If so, are there any without handrails? N/a Home free of loose throw rugs in walkways, pet beds, electrical cords, etc? Yes  Adequate lighting in your home to reduce risk of falls? Yes   ASSISTIVE DEVICES UTILIZED TO PREVENT FALLS:  Life alert? No  Use of a cane, walker or w/c? No  Grab bars in the bathroom? Yes  Shower chair or bench in shower? No  Elevated toilet seat or a handicapped toilet? Yes   TIMED UP AND GO:  Was the test performed? No .      Cognitive Function:        10/04/2022    3:18 PM 09/08/2021    3:29 PM 11/17/2020   12:09 PM  6CIT Screen  What Year? 0 points 0 points 0 points  What month? 0 points 0 points 0 points  What time? 0 points 0 points 0 points  Count back from 20 0 points 0 points 0 points  Months in reverse 2 points 0 points 0 points  Repeat phrase 4 points 6 points 2 points  Total Score 6 points 6 points 2 points    Immunizations Immunization History  Administered Date(s) Administered   Influenza,inj,Quad PF,6+ Mos 05/02/2022   PFIZER(Purple Top)SARS-COV-2 Vaccination 05/24/2020, 08/02/2020  Tdap 09/21/2022    TDAP status: Up to date  Flu Vaccine status: Up to date  Pneumococcal vaccine status: Up to date  Covid-19 vaccine status: Completed vaccines  Qualifies for Shingles Vaccine? Yes   Zostavax completed No   Shingrix Completed?: No.    Education has been provided regarding the importance of this vaccine. Patient has been advised to call  insurance company to determine out of pocket expense if they have not yet received this vaccine. Advised may also receive vaccine at local pharmacy or Health Dept. Verbalized acceptance and understanding.  Screening Tests Health Maintenance  Topic Date Due   COLONOSCOPY (Pts 45-35yr Insurance coverage will need to be confirmed)  Never done   MAMMOGRAM  Never done   Zoster Vaccines- Shingrix (1 of 2) Never done   COVID-19 Vaccine (3 - 2023-24 season) 04/21/2022   Medicare Annual Wellness (AWV)  10/05/2023   DTaP/Tdap/Td (2 - Td or Tdap) 09/21/2032   INFLUENZA VACCINE  Completed   Hepatitis C Screening  Completed   HIV Screening  Completed   HPV VACCINES  Aged Out    Health Maintenance  Health Maintenance Due  Topic Date Due   COLONOSCOPY (Pts 45-451yrInsurance coverage will need to be confirmed)  Never done   MAMMOGRAM  Never done   Zoster Vaccines- Shingrix (1 of 2) Never done   COVID-19 Vaccine (3 - 2023-24 season) 04/21/2022    Colorectal cancer screening: Referral to GI placed 09/21/2022. Pt aware the office will call re: appt.  Mammogram status: patient to schedule  Bone Density status: n/a  Lung Cancer Screening: (Low Dose CT Chest recommended if Age 62-80ears, 30 pack-year currently smoking OR have quit w/in 15years.) does not qualify.   Lung Cancer Screening Referral: no  Additional Screening:  Hepatitis C Screening: does qualify; Completed 05/02/2022  Vision Screening: Recommended annual ophthalmology exams for early detection of glaucoma and other disorders of the eye. Is the patient up to date with their annual eye exam?  No  Who is the provider or what is the name of the office in which the patient attends annual eye exams? none If pt is not established with a provider, would they like to be referred to a provider to establish care? No .   Dental Screening: Recommended annual dental exams for proper oral hygiene  Community Resource Referral / Chronic Care  Management: CRR required this visit?  No   CCM required this visit?  No      Plan:     I have personally reviewed and noted the following in the patient's chart:   Medical and social history Use of alcohol, tobacco or illicit drugs  Current medications and supplements including opioid prescriptions. Patient is not currently taking opioid prescriptions. Functional ability and status Nutritional status Physical activity Advanced directives List of other physicians Hospitalizations, surgeries, and ER visits in previous 12 months Vitals Screenings to include cognitive, depression, and falls Referrals and appointments  In addition, I have reviewed and discussed with patient certain preventive protocols, quality metrics, and best practice recommendations. A written personalized care plan for preventive services as well as general preventive health recommendations were provided to patient.     NiKellie SimmeringLPN   [id number] Nurse Notes: none  Due to this being a virtual visit, the after visit summary with patients personalized plan was offered to patient via mail or my-chart.  to pick up at office at next visit

## 2022-10-04 NOTE — Patient Instructions (Signed)
[name] , Thank you for taking time to come for your Medicare Wellness Visit. I appreciate your ongoing commitment to your health goals. Please review the following plan we discussed and let me know if I can assist you in the future.   These are the goals we discussed:  Goals      Care Coordination Activities     Care Coordination Interventions: Discussed patient has not yet moved out of hotel; plans to go back to [place] patient to also visit [employer] for assistance from homelessness prevention case management team Encouraged patient to contact SW if needed prior to next outreach      Patient Stated     11/17/2020, wants kidneys and BP to stay under control     Patient Stated     09/08/2021, wants to get better     Patient Stated     10/04/2022, wants to get kidneys under control        This is a list of the screening recommended for you and due dates:  Health Maintenance  Topic Date Due   Colon Cancer Screening  Never done   Mammogram  Never done   Zoster (Shingles) Vaccine (1 of 2) Never done   COVID-19 Vaccine (3 - 2023-24 season) 04/21/2022   Medicare Annual Wellness Visit  10/05/2023   DTaP/Tdap/Td vaccine (2 - Td or Tdap) 09/21/2032   Flu Shot  Completed   Hepatitis C Screening: USPSTF Recommendation to screen - Ages 18-79 yo.  Completed   HIV Screening  Completed   HPV Vaccine  Aged Out    Advanced directives: Advance directive discussed with you today.   Conditions/risks identified: smoking  Next appointment: Follow up in one year for your annual wellness visit.   Preventive Care 40-64 Years, Female Preventive care refers to lifestyle choices and visits with your health care provider that can promote health and wellness. What does preventive care include? A yearly physical exam. This is also called an annual well check. Dental exams once or twice a year. Routine eye exams. Ask your health care provider how often  you should have your eyes checked. Personal lifestyle choices, including: Daily care of your teeth and gums. Regular physical activity. Eating a healthy diet. Avoiding tobacco and drug use. Limiting alcohol use. Practicing safe sex. Taking low-dose aspirin daily starting at age 56. Taking vitamin and mineral supplements as recommended by your health care provider. What happens during an annual well check? The services and screenings done by your health care provider during your annual well check will depend on your age, overall health, lifestyle risk factors, and family history of disease. Counseling  Your health care provider may ask you questions about your: Alcohol use. Tobacco use. Drug use. Emotional well-being. Home and relationship well-being. Sexual activity. Eating habits. Work and work Statistician. Method of birth control. Menstrual cycle. Pregnancy history. Screening  You may have the following tests or measurements: Height, weight, and BMI. Blood pressure. Lipid and cholesterol levels. These may be checked every 5 years, or more frequently if you are over 35 years old. Skin check. Lung cancer screening. You may have this screening every year starting at age 38 if you have a 30-pack-year history of smoking and currently smoke or have quit within the past 15 years. Fecal occult blood test (FOBT) of the stool. You may have this test every year starting at age 17. Flexible sigmoidoscopy or colonoscopy. You may have a sigmoidoscopy every 5  years or a colonoscopy every 10 years starting at age 72. Hepatitis C blood test. Hepatitis B blood test. Sexually transmitted disease (STD) testing. Diabetes screening. This is done by checking your blood sugar (glucose) after you have not eaten for a while (fasting). You may have this done every 1-3 years. Mammogram. This may be done every 1-2 years. Talk to your health care provider about when you should start having regular  mammograms. This may depend on whether you have a family history of breast cancer. BRCA-related cancer screening. This may be done if you have a family history of breast, ovarian, tubal, or peritoneal cancers. Pelvic exam and Pap test. This may be done every 3 years starting at age 55. Starting at age 9, this may be done every 5 years if you have a Pap test in combination with an HPV test. Bone density scan. This is done to screen for osteoporosis. You may have this scan if you are at high risk for osteoporosis. Discuss your test results, treatment options, and if necessary, the need for more tests with your health care provider. Vaccines  Your health care provider may recommend certain vaccines, such as: Influenza vaccine. This is recommended every year. Tetanus, diphtheria, and acellular pertussis (Tdap, Td) vaccine. You may need a Td booster every 10 years. Zoster vaccine. You may need this after age 56. Pneumococcal 13-valent conjugate (PCV13) vaccine. You may need this if you have certain conditions and were not previously vaccinated. Pneumococcal polysaccharide (PPSV23) vaccine. You may need one or two doses if you smoke cigarettes or if you have certain conditions. Talk to your health care provider about which screenings and vaccines you need and how often you need them. This information is not intended to replace advice given to you by your health care provider. Make sure you discuss any questions you have with your health care provider. Document Released: 09/03/2015 Document Revised: 04/26/2016 Document Reviewed: 06/08/2015 Elsevier Interactive Patient Education  2017 [place] Prevention in the Home Falls can cause injuries. They can happen to people of all ages. There are many things you can do to make your home safe and to help prevent falls. What can I do on the outside of my home? Regularly fix the edges of walkways and driveways and fix any cracks. Remove anything  that might make you trip as you walk through a door, such as a raised step or threshold. Trim any bushes or trees on the path to your home. Use bright outdoor lighting. Clear any walking paths of anything that might make someone trip, such as rocks or tools. Regularly check to see if handrails are loose or broken. Make sure that both sides of any steps have handrails. Any raised decks and porches should have guardrails on the edges. Have any leaves, snow, or ice cleared regularly. Use sand or salt on walking paths during winter. Clean up any spills in your garage right away. This includes oil or grease spills. What can I do in the bathroom? Use night lights. Install grab bars by the toilet and in the tub and shower. Do not use towel bars as grab bars. Use non-skid mats or decals in the tub or shower. If you need to sit down in the shower, use a plastic, non-slip stool. Keep the floor dry. Clean up any water that spills on the floor as soon as it happens. Remove soap buildup in the tub or shower regularly. Attach bath mats securely with double-sided  non-slip rug tape. Do not have throw rugs and other things on the floor that can make you trip. What can I do in the bedroom? Use night lights. Make sure that you have a light by your bed that is easy to reach. Do not use any sheets or blankets that are too big for your bed. They should not hang down onto the floor. Have a firm chair that has side arms. You can use this for support while you get dressed. Do not have throw rugs and other things on the floor that can make you trip. What can I do in the kitchen? Clean up any spills right away. Avoid walking on wet floors. Keep items that you use a lot in easy-to-reach places. If you need to reach something above you, use a strong step stool that has a grab bar. Keep electrical cords out of the way. Do not use floor polish or wax that makes floors slippery. If you must use wax, use non-skid floor  wax. Do not have throw rugs and other things on the floor that can make you trip. What can I do with my stairs? Do not leave any items on the stairs. Make sure that there are handrails on both sides of the stairs and use them. Fix handrails that are broken or loose. Make sure that handrails are as long as the stairways. Check any carpeting to make sure that it is firmly attached to the stairs. Fix any carpet that is loose or worn. Avoid having throw rugs at the top or bottom of the stairs. If you do have throw rugs, attach them to the floor with carpet tape. Make sure that you have a light switch at the top of the stairs and the bottom of the stairs. If you do not have them, ask someone to add them for you. What else can I do to help prevent falls? Wear shoes that: Do not have high heels. Have rubber bottoms. Are comfortable and fit you well. Are closed at the toe. Do not wear sandals. If you use a stepladder: Make sure that it is fully opened. Do not climb a closed stepladder. Make sure that both sides of the stepladder are locked into place. Ask someone to hold it for you, if possible. Clearly mark and make sure that you can see: Any grab bars or handrails. First and last steps. Where the edge of each step is. Use tools that help you move around (mobility aids) if they are needed. These include: Canes. Walkers. Scooters. Crutches. Turn on the lights when you go into a dark area. Replace any light bulbs as soon as they burn out. Set up your furniture so you have a clear path. Avoid moving your furniture around. If any of your floors are uneven, fix them. If there are any pets around you, be aware of where they are. Review your medicines with your doctor. Some medicines can make you feel dizzy. This can increase your chance of falling. Ask your doctor what other things that you can do to help prevent falls. This information is not intended to replace advice given to you by your  health care provider. Make sure you discuss any questions you have with your health care provider. Document Released: 06/03/2009 Document Revised: 01/13/2016 Document Reviewed: 09/11/2014 Elsevier Interactive Patient Education  2017 [employer].

## 2022-10-06 ENCOUNTER — Telehealth: Payer: Self-pay | Admitting: *Deleted

## 2022-10-06 ENCOUNTER — Telehealth: Payer: 59

## 2022-10-06 NOTE — Telephone Encounter (Signed)
   CCM RN Visit Note   10/06/22 Name: Shirline Coelho MRN: [id number]      DOB: [birth year]  Subjective: Donald Curtiss is a 62 y.o. year old female who is a primary care patient of [name] FNP The patient was referred to the Chronic Care Management team for assistance with care management needs subsequent to provider initiation of CCM services and plan of care.      An unsuccessful telephone outreach was attempted today to contact the patient about Chronic Care Management needs.    Plan:Telephone follow up appointment with care management team member scheduled for:  upon care guide rescheduling.  Jacqlyn Larsen [facility], BSN RN Case Manager [facility] [phone]

## 2022-10-10 ENCOUNTER — Telehealth: Payer: Self-pay

## 2022-10-10 NOTE — Progress Notes (Signed)
  Chronic Care Management Note  10/10/2022 Name: [name] MRN: [id number] DOB: 1961-03-22  [name] is a 62 y.o. year old female who is a primary care patient of [name], [place] and is actively engaged with the Chronic Care Management team. I reached out to [facility] by phone today to assist with re-scheduling an initial visit with the RN Case Manager  Follow up plan: Unsuccessful telephone outreach attempt made. A HIPAA compliant phone message was left for the patient providing contact information and requesting a return call.  The care management team will reach out to the patient again over the next 7 days.  If patient returns call to provider office, please advise to call [place]  at [place], [place], [place] [address] Direct Dial: [phone] [email or web address]

## 2022-10-12 ENCOUNTER — Ambulatory Visit: Payer: Self-pay

## 2022-10-12 NOTE — Patient Instructions (Signed)
Visit Information  Thank you for taking time to visit with me today. Please don't hesitate to contact me if I can be of assistance to you.   Following are the goals we discussed today:   Goals Addressed             This Visit's Progress    COMPLETED: Care Coordination Activities       Care Coordination Interventions: Discussed patient has not yet moved out of hotel; plans to look for new housing this Friday Reviewed option for patient to contact SW as needed, no other needs identified at this time        If you are experiencing a Mental Health or [place] or need someone to talk to, please call 911  Patient verbalizes understanding of instructions and care plan provided today and agrees to view in [place]. Active MyChart status and patient understanding of how to access instructions and care plan via MyChart confirmed with patient.     No further follow up required: Please contact me as needed.  Daneen Schick, BSW, CDP Social Worker, Certified Dementia Practitioner [place] Management  Care Coordination [phone]

## 2022-10-12 NOTE — Patient Outreach (Signed)
  Care Coordination   Follow Up Visit Note   10/12/2022 Name: Kailye Arebalo MRN: [id number] DOB: 1960-10-09  Shuana Bogucki is a 62 y.o. year old female who sees [name], [place] for primary care. I spoke with  Melford Aase by phone today.  What matters to the patients health and wellness today?  I want to move    Goals Addressed             This Visit's Progress    COMPLETED: Care Coordination Activities       Care Coordination Interventions: Discussed patient has not yet moved out of hotel; plans to look for new housing this Friday Reviewed option for patient to contact SW as needed, no other needs identified at this time        SDOH assessments and interventions completed:  No     Care Coordination Interventions:  Yes, provided   Interventions Today    Flowsheet Row Most Recent Value  Chronic Disease   Chronic disease during today's visit Hypertension (HTN)  General Interventions   General Interventions Discussed/Reviewed General Interventions Reviewed        Follow up plan: No further intervention required.   Encounter Outcome:  Pt. Visit Completed   Daneen Schick, BSW, CDP Social Worker, Certified Dementia Practitioner [place] Management  Care Coordination [phone]

## 2022-10-19 ENCOUNTER — Other Ambulatory Visit: Payer: 59 | Admitting: Pharmacist

## 2022-10-20 ENCOUNTER — Other Ambulatory Visit: Payer: 59 | Admitting: Pharmacist

## 2022-10-20 DIAGNOSIS — E782 Mixed hyperlipidemia: Secondary | ICD-10-CM

## 2022-10-20 DIAGNOSIS — I119 Hypertensive heart disease without heart failure: Secondary | ICD-10-CM

## 2022-10-20 DIAGNOSIS — I152 Hypertension secondary to endocrine disorders: Secondary | ICD-10-CM

## 2022-10-20 NOTE — Progress Notes (Unsigned)
10/20/2022 Name: [name] MRN: [id number] DOB: 05/23/1961  Chief Complaint  Patient presents with   Medication Management   Hypertension    [name] is a 62 y.o. year old female who presented for a telephone visit.   They were referred to the pharmacist by their PCP for assistance in managing hypertension and hyperlipidemia.   Subjective:  Care Team: Primary Care Provider: [name], FNP ; Next Scheduled Visit:   Medication Access/Adherence  Current Pharmacy:  [facility], [address] AT [place] [address] Phone: [phone] Fax: [facility] [place], [place] [address] AT [facility] [address] [place] [address] Phone: [phone] Fax: [phone]   Patient reports affordability concerns with their medications: No  Patient reports access/transportation concerns to their pharmacy: Yes  Patient reports adherence concerns with their medications:  Yes    Reports she thinks she is taking her medications regularly, but upon review she has not filled the telmisartan in several months.    Hypertension:  Current medications: amlodipine 10 mg daily, spironolactone 25 mg daily, telmisartan 80 mg - she has not filled telmisartan since June  Patient does not have a validated, automated, upper arm home BP cuff  Current meal patterns: reports she does not add salt when eating food, but sometimes cooks with salt. 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   CKD: Current medications: Farxiga 10 mg daily  Hyperlipidemia/ASCVD Risk Reduction  Current lipid lowering medications: atorvastatin 40 mg daily  Antiplatelet regimen: aspirin 81 mg daily  Tobacco Abuse:  Tobacco Use History: Age when started using tobacco on a daily basis: age 62;   Number of cigarettes per day 1/3 ppd  Smokes first cigarette 1 hour after waking Sometimes  wake at night to smoke, but not every night Triggers include stress  Quit Attempt History: Rates IMPORTANCE of quitting tobacco on 1-10 scale of 10.  Rates CONFIDENCE of quitting tobacco on 1-10 scale of 7. Motivators to quitting include health;     Objective:  Lab Results  Component Value Date   HGBA1C 4.9 08/23/2021    Lab Results  Component Value Date   CREATININE 1.93 (H) 09/21/2022   BUN 39 (H) 09/21/2022   NA 139 09/21/2022   K 4.7 09/21/2022   CL 105 09/21/2022   CO2 21 09/21/2022    Lab Results  Component Value Date   CHOL 202 (H) 09/21/2022   HDL 73 09/21/2022   LDLCALC 110 (H) 09/21/2022   TRIG 109 09/21/2022   CHOLHDL 2.8 09/21/2022    Medications Reviewed Today     Reviewed by Osker Mason, RPH-CPP (Pharmacist) on 10/20/22 at 1014  Med List Status: <None>   Medication Order Taking? Sig Documenting Provider Last Dose Status Informant  acetaminophen (TYLENOL) 325 MG tablet [id number]  Take 650 mg by mouth every 6 (six) hours as needed for mild pain or headache. [provider]  Active Self  amLODipine (NORVASC) 10 MG tablet [id number] Yes TAKE 1 TABLET(10 MG) BY MOUTH DAILY [name], FNP Taking Active   aspirin 81 MG chewable tablet [id number] Yes Chew 81 mg by mouth daily. [provider] Taking Active   atorvastatin (LIPITOR) 40 MG tablet [id number] Yes Take 1 tablet (40 mg total) by mouth daily. [name],  FNP Taking Active   FARXIGA 10 MG TABS tablet [id number] Yes Take 1 tablet (10 mg total) by mouth daily. [name], FNP Taking Active   gabapentin (NEURONTIN) 100 MG capsule [id number] Yes Take 2 capsules (200 mg total) by mouth 2 (two) times daily. [name], FNP Taking Active            Med Note Jodi Mourning, Abdirahman Chittum T   Fri Oct 20, 2022 10:14 AM) Taking 100 mg twice daily  spironolactone (ALDACTONE) 25 MG tablet [id number] Yes  Take 25 mg by mouth daily. [provider] Taking Active   telmisartan (MICARDIS) 80 MG tablet [id number] Yes Take 80 mg by mouth daily. [provider] Taking Active               Assessment/Plan:   Hypertension: - Currently uncontrolled - Reviewed long term cardiovascular and renal outcomes of uncontrolled blood pressure - Reviewed appropriate blood pressure monitoring technique and reviewed goal blood pressure. Recommended to check home blood pressure and heart rate daily - Recommend to purchase an [employer] brand upper arm cuff with OTC benefits. Patient notes she will do this today.  - Counseled on proper technique of home BP monitoring.  - Will collaborate with PCP and Pharmacy team on adherence packaging.   CKD - Discussed importance of adherence to ARB and Farxiga, as well as importance of obtaining control of HTN  Hyperlipidemia/ASCVD Risk Reduction: - Currently uncontrolled, though adherence unclear  Tobacco Abuse - Currently uncontrolled - Provided motivational interviewing to assess tobacco use and strategies for reduction. Patient is extremely motivated to quit smoking this month. Discussed triggers including stress. Patient notes she will keep gum on her to chew when she gets cravings.  - Declines pharmacotherapy.     Follow Up Plan: phone call in 5 weeks  Catie TJodi Mourning, PharmD, [place], [place] [facility] [phone]

## 2022-10-23 MED ORDER — TELMISARTAN 80 MG PO TABS
80.0000 mg | ORAL_TABLET | Freq: Every day | ORAL | 1 refills | Status: DC
  Filled 2022-10-23: qty 90, 90d supply, fill #0
  Filled 2022-10-26: qty 30, 30d supply, fill #0
  Filled 2022-12-26 – 2022-12-27 (×2): qty 30, 30d supply, fill #1
  Filled 2023-01-26 – 2023-02-06 (×2): qty 30, 30d supply, fill #2

## 2022-10-23 MED ORDER — SPIRONOLACTONE 25 MG PO TABS
25.0000 mg | ORAL_TABLET | Freq: Every day | ORAL | 1 refills | Status: DC
  Filled 2022-10-23: qty 90, 90d supply, fill #0
  Filled 2022-10-26: qty 30, 30d supply, fill #0
  Filled 2022-12-26: qty 30, 30d supply, fill #1
  Filled 2023-01-26: qty 30, 30d supply, fill #2

## 2022-10-23 MED ORDER — AMLODIPINE BESYLATE 10 MG PO TABS
10.0000 mg | ORAL_TABLET | Freq: Every day | ORAL | 1 refills | Status: DC
  Filled 2022-10-23 – 2022-10-26 (×2): qty 90, 90d supply, fill #0
  Filled 2022-12-26 – 2022-12-27 (×2): qty 30, 30d supply, fill #0
  Filled 2023-01-26 – 2023-02-06 (×2): qty 30, 30d supply, fill #1

## 2022-10-23 MED ORDER — ATORVASTATIN CALCIUM 40 MG PO TABS
40.0000 mg | ORAL_TABLET | Freq: Every day | ORAL | 1 refills | Status: DC
  Filled 2022-10-23 – 2022-10-26 (×2): qty 90, 90d supply, fill #0
  Filled 2022-12-26 – 2023-01-26 (×2): qty 30, 30d supply, fill #0

## 2022-10-23 MED ORDER — DAPAGLIFLOZIN PROPANEDIOL 10 MG PO TABS
10.0000 mg | ORAL_TABLET | Freq: Every day | ORAL | 1 refills | Status: DC
  Filled 2022-10-23 – 2022-10-26 (×2): qty 90, 90d supply, fill #0

## 2022-10-23 MED ORDER — ASPIRIN 81 MG PO CHEW
81.0000 mg | CHEWABLE_TABLET | Freq: Every day | ORAL | 1 refills | Status: DC
  Filled 2022-10-23 – 2022-10-26 (×2): qty 90, 90d supply, fill #0
  Filled 2022-12-26 – 2022-12-27 (×2): qty 30, 30d supply, fill #0
  Filled 2023-01-26 – 2023-02-06 (×2): qty 30, 30d supply, fill #1

## 2022-10-23 NOTE — Progress Notes (Signed)
  Chronic Care Management Note  10/23/2022 Name: Aisley Stanphill MRN: [id number] DOB: 1960-11-18  Omara Gilhooley is a 62 y.o. year old female who is a primary care patient of [name], [place] and is actively engaged with the Chronic Care Management team. I reached out to [facility] by phone today to assist with re-scheduling an initial visit with the RN Case Manager  Follow up plan: Unsuccessful telephone outreach attempt made. A HIPAA compliant phone message was left for the patient providing contact information and requesting a return call.  The care management team will reach out to the patient again over the next 7 days.  If patient returns call to provider office, please advise to call [place]  at [place], [place], [place] [address] Direct Dial: [phone] [email or web address]

## 2022-10-24 ENCOUNTER — Other Ambulatory Visit (HOSPITAL_COMMUNITY): Payer: Self-pay

## 2022-10-25 ENCOUNTER — Other Ambulatory Visit: Payer: Self-pay

## 2022-10-26 ENCOUNTER — Other Ambulatory Visit (HOSPITAL_COMMUNITY): Payer: Self-pay

## 2022-10-26 ENCOUNTER — Other Ambulatory Visit: Payer: Self-pay

## 2022-10-30 ENCOUNTER — Other Ambulatory Visit (HOSPITAL_COMMUNITY): Payer: Self-pay

## 2022-11-01 NOTE — Progress Notes (Signed)
  Chronic Care Management Note  11/01/2022 Name: [name] MRN: [id number] DOB: 1961-08-05  [name] is a 62 y.o. year old female who is a primary care patient of [name], [place] and is actively engaged with the Chronic Care Management team. I reached out to [facility] by phone today to assist with re-scheduling an initial visit with the RN Case Manager  Follow up plan: Telephone appointment with care management team member scheduled for:11/06/2022  Noreene Larsson, [place], [place] [address] Direct Dial: [phone] [email or web address]

## 2022-11-06 ENCOUNTER — Ambulatory Visit (INDEPENDENT_AMBULATORY_CARE_PROVIDER_SITE_OTHER): Payer: 59 | Admitting: *Deleted

## 2022-11-06 DIAGNOSIS — I152 Hypertension secondary to endocrine disorders: Secondary | ICD-10-CM

## 2022-11-06 DIAGNOSIS — E782 Mixed hyperlipidemia: Secondary | ICD-10-CM

## 2022-11-06 NOTE — Chronic Care Management (AMB) (Signed)
Chronic Care Management   CCM RN Visit Note  11/06/2022 Name: [name] MRN: [id number] DOB: [birth year]  Subjective: [name] is a 62 y.o. year old female who is a primary care patient of [name], [place]. The patient was referred to the Chronic Care Management team for assistance with care management needs subsequent to provider initiation of CCM services and plan of care.    Today's Visit:  Engaged with patient by telephone for initial visit.     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  No Advanced Directives in place- pt declines Patient reports she has "cut back on smoking, down to 1-2 cigarettes per day"  and feels she is making good progress towards completely quitting. Patient reports she does not follow a special diet, walks in the evening after work.  Planned Interventions: Provider established cholesterol goals reviewed; Counseled on importance of regular laboratory monitoring as  prescribed; Provided HLD educational materials; Reviewed role and benefits of statin for ASCVD risk reduction; Discussed strategies to manage statin-induced myalgias; Reviewed importance of limiting foods high in cholesterol; Reviewed exercise goals and target of 150 minutes per week; Screening for signs and symptoms of depression related to chronic disease state;  Assessed social determinant of health barriers;   Symptom Management: Take medications as prescribed   Attend all scheduled provider appointments Call pharmacy for medication refills 3-7 days in advance of running out of medications Attend church or other social activities Perform all self care activities independently  Perform IADL's (shopping, preparing meals, housekeeping, managing finances) independently Call provider office for new concerns or questions  - call doctor with any symptoms you believe are related to your medicine - call doctor when you experience any new symptoms - go to all doctor appointments as scheduled - adhere to prescribed diet: heart healthy Look over education mailed- heart healthy diet  Follow Up Plan: Telephone follow up appointment with care management team member scheduled for: 12/29/22 at 215 pm       CCM (HYPERTENSION) EXPECTED OUTCOME: MONITOR, SELF-MANAGE AND REDUCE SYMPTOMS OF HYPERTENSION       Current Barriers:  Knowledge Deficits related to Hypertension management Care Coordination needs related to medication management in a patient with Hypertension Chronic Disease Management support and education needs related to Hypertension, diet, achieving a healthy lifestyle Financial Constraints.  No Advanced Directives in place- pt declines Patient reports she lives alone in hotel, has a sister that assists with transportation and is always available to help if needed.  Patient reports she is looking for housing as she had to go to court about her bill  at the hotel and this is still in  progress, social worker was recently working with pt and provided necessary resources to find housing, pt verbalizes understanding of resources and is able to call and check on these resources herself.  Patient reports she works 3 days per week, gets 362$ from [employer]) Patient reports her blood pressure is checked at work, pt reports she can get a blood pressure cuff from [employer] benefit and plans to do so.  Planned Interventions: Evaluation of current treatment plan related to hypertension self management and patient's adherence to plan as established by provider;   Reviewed prescribed diet low sodium Reviewed medications with patient and discussed importance of compliance;  Counseled on adverse effects of illicit drug and excessive alcohol use in patients with high blood pressure;  Counseled on the importance of exercise goals with target of 150 minutes per week Discussed plans with patient for ongoing care management follow up and provided patient with direct contact information for care management team; Advised patient, providing education and rationale, to monitor blood pressure daily and record, calling PCP for findings outside established parameters;  Provided education on prescribed diet low sodium;  Discussed complications of poorly controlled blood pressure such as heart disease, stroke, circulatory complications, vision complications, kidney impairment, sexual dysfunction;  Screening for signs and symptoms of depression related to chronic disease state;  Assessed social determinant of health barriers;  Reviewed importance of to continue checking into provided resources for housing  Symptom Management: Take medications as prescribed   Attend all scheduled provider appointments Call pharmacy for medication refills 3-7 days in advance of running out of medications Attend church or other social activities Perform all self care activities independently  Perform IADL's  (shopping, preparing meals, housekeeping, managing finances) independently Call provider office for new concerns or questions  check blood pressure 3 times per week choose a place to take my blood pressure (home, clinic or office, retail store) write blood pressure results in a log or diary learn about high blood pressure keep a blood pressure log take blood pressure log to all doctor appointments call doctor for signs and symptoms of high blood pressure keep all doctor appointments take medications for blood pressure exactly as prescribed report new symptoms to your doctor eat more whole grains, fruits and vegetables, lean meats and healthy fats Look over education mailed- low sodium diet Obtain blood pressure cuff from [employer] benefit Continue to contact and follow up with agencies/ resources provided by Education officer, museum to find housing  Follow Up Plan: Telephone follow up appointment with care management team member scheduled for:   12/29/22 at 215 pm          Plan:Telephone follow up appointment with care management team member scheduled for:  12/29/22 at 215 pm  Jacqlyn Larsen [facility], BSN RN Case Manager [facility] [phone]

## 2022-11-06 NOTE — Plan of Care (Signed)
Chronic Care Management Provider Comprehensive Care Plan    11/06/2022 Name: [name] MRN: [id number] DOB: 04-24-1961  Referral to Chronic Care Management (CCM) services was placed by Provider:  [name] FNP on Date: 09/21/22.  Chronic Condition 1: HYPERTENSION Provider Assessment and Plan Hypertension due to endocrine disorder Comments: Blood pressure is elevated, she is not taking her medications. Discussed importance of taking her medications as directed. - FARXIGA 10 MG TABS tablet; Take 1 tablet (10 mg total) by mouth daily.  Dispense: 90 tablet; Refill: 0 - AMB Referral to [place] ([facility] Patients) - AMB Referral to Chronic Care Management Services - BMP8+eGFR   Expected Outcome/Goals Addressed This Visit (Provider CCM goals/Provider Assessment and plan  CCM (HYPERTENSION) EXPECTED OUTCOME: MONITOR, SELF-MANAGE AND REDUCE SYMPTOMS OF HYPERTENSION  Symptom Management Condition 1: Take medications as prescribed   Attend all scheduled provider appointments Call pharmacy for medication refills 3-7 days in advance of running out of medications Attend church or other social activities Perform all self care activities independently  Perform IADL's (shopping, preparing meals, housekeeping, managing finances) independently Call provider office for new concerns or questions  check blood pressure 3 times per week choose a place to take my blood pressure (home, clinic or office, retail store) write blood pressure results in a log or diary learn about high blood pressure keep a blood pressure log take blood pressure log to all doctor appointments call doctor for signs and symptoms of high blood pressure keep all doctor appointments take medications for blood pressure exactly as prescribed report new symptoms to your doctor eat more whole grains, fruits and vegetables, lean meats and healthy fats Look over education mailed- low sodium diet Obtain blood  pressure cuff from [employer] benefit Continue to contact and follow up with agencies/ resources provided by Education officer, museum to find housing  Chronic Condition 2: HYPERLIPIDEMIA Provider Assessment and Plan   Mixed hyperlipidemia - AMB Referral to [place] ([facility] Patients) - AMB Referral to Chronic Care Management Services - Lipid panel  Expected Outcome/Goals Addressed This Visit (Provider CCM goals/Provider Assessment and plan  CCM (HYPERLIPIDEMIA) EXPECTED OUTCOME: MONITOR, SELF-MANAGE AND REDUCE SYMPTOMS OF HYPERLIPIDEMIA  Symptom Management Condition 2: Take medications as prescribed   Attend all scheduled provider appointments Call pharmacy for medication refills 3-7 days in advance of running out of medications Attend church or other social activities Perform all self care activities independently  Perform IADL's (shopping, preparing meals, housekeeping, managing finances) independently Call provider office for new concerns or questions  - call doctor with any symptoms you believe are related to your medicine - call doctor when you experience any new symptoms - go to all doctor appointments as scheduled - adhere to prescribed diet: heart healthy Look over education mailed- heart healthy diet  Problem List Patient Active Problem List   Diagnosis Date Noted   Claudication, intermittent ([place]) 09/21/2022   Stage 3b chronic kidney disease ([place]) 09/21/2022   Adrenal adenoma 01/11/2021   Left ventricular hypertrophy 01/11/2021   Substance-induced disorder ([place]) 12/05/2020   Cocaine use disorder ([place]) 12/05/2020   Atypical angina 10/11/2020   Stress 02/27/2019   Hypoglycemia without diagnosis of diabetes mellitus 09/06/2018   CKD (chronic kidney disease) stage 3, GFR 30-59 ml/min ([place]) 09/06/2018   Abdominal pain 09/06/2018   Chest pain 09/05/2018   Hypertensive urgency 09/05/2018   Wrist joint effusion, right 05/30/2017   Hypertension due to endocrine  disorder 01/17/2017   Hyperaldosteronism ([place]) 01/17/2017   AKI (  acute kidney injury) ([place]) 10/19/2015   Varicose veins of left lower extremity with complications [id number]   Anemia 12/25/2014   Encounter for preventive care 11/11/2014   Tobacco abuse [id number]   Diastolic dysfunction without heart failure 06/11/2013   GERD (gastroesophageal reflux disease) 06/11/2013   Severe uncontrolled hypertension 05/12/2013   Hyperlipemia 05/12/2013    Medication Management  Current Outpatient Medications:    acetaminophen (TYLENOL) 325 MG tablet, Take 650 mg by mouth every 6 (six) hours as needed for mild pain or headache., Disp: , Rfl:    amLODipine (NORVASC) 10 MG tablet, Take 1 tablet (10 mg total) by mouth daily., Disp: 90 tablet, Rfl: 1   aspirin 81 MG chewable tablet, Chew 1 tablet (81 mg total) by mouth daily., Disp: 90 tablet, Rfl: 1   atorvastatin (LIPITOR) 40 MG tablet, Take 1 tablet (40 mg total) by mouth daily., Disp: 90 tablet, Rfl: 1   dapagliflozin propanediol (FARXIGA) 10 MG TABS tablet, Take 1 tablet (10 mg total) by mouth daily., Disp: 90 tablet, Rfl: 1   gabapentin (NEURONTIN) 100 MG capsule, Take 2 capsules (200 mg total) by mouth 2 (two) times daily., Disp: 120 capsule, Rfl: 5   spironolactone (ALDACTONE) 25 MG tablet, Take 1 tablet (25 mg total) by mouth daily., Disp: 90 tablet, Rfl: 1   telmisartan (MICARDIS) 80 MG tablet, Take 1 tablet (80 mg total) by mouth daily., Disp: 90 tablet, Rfl: 1  Cognitive Assessment Identity Confirmed: : Name; DOB Cognitive Status: Normal Other:  : spoke with patient   Functional Assessment Hearing Difficulty or Deaf: no Wear Glasses or Blind: no (12/09/22 to see Dr. Katy Fitch) Concentrating, Remembering or Making Decisions Difficulty (CP): no Difficulty Communicating: no Difficulty Eating/Swallowing: no Walking or Climbing Stairs Difficulty: no Dressing/Bathing Difficulty: no Doing Errands Independently Difficulty (such as shopping)  (CP): no   Caregiver Assessment  Primary Source of Support/Comfort: sibling(s) Name of Support/Comfort Primary Source: [name] People in Home: alone   Planned Interventions  Provider established cholesterol goals reviewed; Counseled on importance of regular laboratory monitoring as prescribed; Provided HLD educational materials; Reviewed role and benefits of statin for ASCVD risk reduction; Discussed strategies to manage statin-induced myalgias; Reviewed importance of limiting foods high in cholesterol; Reviewed exercise goals and target of 150 minutes per week; Screening for signs and symptoms of depression related to chronic disease state;  Assessed social determinant of health barriers;  Evaluation of current treatment plan related to hypertension self management and patient's adherence to plan as established by provider;   Reviewed prescribed diet low sodium Reviewed medications with patient and discussed importance of compliance;  Counseled on adverse effects of illicit drug and excessive alcohol use in patients with high blood pressure;  Counseled on the importance of exercise goals with target of 150 minutes per week Discussed plans with patient for ongoing care management follow up and provided patient with direct contact information for care management team; Advised patient, providing education and rationale, to monitor blood pressure daily and record, calling PCP for findings outside established parameters;  Provided education on prescribed diet low sodium;  Discussed complications of poorly controlled blood pressure such as heart disease, stroke, circulatory complications, vision complications, kidney impairment, sexual dysfunction;  Screening for signs and symptoms of depression related to chronic disease state;  Assessed social determinant of health barriers;  Reviewed importance of to continue checking into provided resources for housing Interaction and coordination  with outside resources, practitioners, and providers See CCM Referral  Care Plan: Printed  and mailed to patient

## 2022-11-06 NOTE — Patient Instructions (Signed)
Please call the care guide team at [phone] if you need to cancel or reschedule your appointment.   If you are experiencing a Mental Health or [place] or need someone to talk to, please call the Suicide and Crisis Lifeline: 988 call the [place] National Suicide Prevention Lifeline: 289 505 1775 or TTY: 878-391-5746 TTY (716)617-9433) to talk to a trained counselor call 1-800-273-TALK (toll free, 24 hour hotline) go to [facility] [address], [place] [phone]) call the [facility]: [phone] call 911   Following is a copy of the CCM Program Consent:  CCM service includes personalized support from designated clinical staff supervised by the physician, including individualized plan of care and coordination with other care providers 24/7 contact phone numbers for assistance for urgent and routine care needs. Service will only be billed when office clinical staff spend 20 minutes or more in a month to coordinate care. Only one practitioner may furnish and bill the service in a calendar month. The patient may stop CCM services at amy time (effective at the end of the month) by phone call to the office staff. The patient will be responsible for cost sharing (co-pay) or up to 20% of the service fee (after annual deductible is met)  Following is a copy of your full provider care plan:   Goals Addressed             This Visit's Progress    CCM (HYPERLIPIDEMIA) EXPECTED OUTCOME: MONITOR, SELF-MANAGE AND REDUCE SYMPTOMS OF HYPERLIPIDEMIA       Current Barriers:  Knowledge Deficits related to Hyperlipidemia management Chronic Disease Management support and education needs related to Hyperlipidemia, diet, smoking cessation Financial Constraints.  No Advanced Directives in place- pt declines Patient reports she has "cut back on smoking, down to 1-2 cigarettes per day"  and feels she is making good progress  towards completely quitting. Patient reports she does not follow a special diet, walks in the evening after work.  Planned Interventions: Provider established cholesterol goals reviewed; Counseled on importance of regular laboratory monitoring as prescribed; Provided HLD educational materials; Reviewed role and benefits of statin for ASCVD risk reduction; Discussed strategies to manage statin-induced myalgias; Reviewed importance of limiting foods high in cholesterol; Reviewed exercise goals and target of 150 minutes per week; Screening for signs and symptoms of depression related to chronic disease state;  Assessed social determinant of health barriers;   Symptom Management: Take medications as prescribed   Attend all scheduled provider appointments Call pharmacy for medication refills 3-7 days in advance of running out of medications Attend church or other social activities Perform all self care activities independently  Perform IADL's (shopping, preparing meals, housekeeping, managing finances) independently Call provider office for new concerns or questions  - call doctor with any symptoms you believe are related to your medicine - call doctor when you experience any new symptoms - go to all doctor appointments as scheduled - adhere to prescribed diet: heart healthy Look over education mailed- heart healthy diet  Follow Up Plan: Telephone follow up appointment with care management team member scheduled for: 12/29/22 at 215 pm       CCM (HYPERTENSION) EXPECTED OUTCOME: MONITOR, SELF-MANAGE AND REDUCE SYMPTOMS OF HYPERTENSION       Current Barriers:  Knowledge Deficits related to Hypertension management Care Coordination needs related to medication management in a patient with Hypertension Chronic Disease Management support and education needs related to Hypertension, diet, achieving a healthy lifestyle Financial Constraints.  No Advanced Directives in place- pt  declines Patient reports she lives alone in hotel, has a sister that assists with transportation and is always available to help if needed.  Patient reports she is looking for housing as she had to go to court about her bill at the hotel and this is still in progress, social worker was recently working with pt and provided necessary resources to find housing, pt verbalizes understanding of resources and is able to call and check on these resources herself.  Patient reports she works 3 days per week, gets 362$ from [employer]) Patient reports her blood pressure is checked at work, pt reports she can get a blood pressure cuff from [employer] benefit and plans to do so.  Planned Interventions: Evaluation of current treatment plan related to hypertension self management and patient's adherence to plan as established by provider;   Reviewed prescribed diet low sodium Reviewed medications with patient and discussed importance of compliance;  Counseled on adverse effects of illicit drug and excessive alcohol use in patients with high blood pressure;  Counseled on the importance of exercise goals with target of 150 minutes per week Discussed plans with patient for ongoing care management follow up and provided patient with direct contact information for care management team; Advised patient, providing education and rationale, to monitor blood pressure daily and record, calling PCP for findings outside established parameters;  Provided education on prescribed diet low sodium;  Discussed complications of poorly controlled blood pressure such as heart disease, stroke, circulatory complications, vision complications, kidney impairment, sexual dysfunction;  Screening for signs and symptoms of depression related to chronic disease state;  Assessed social determinant of health barriers;  Reviewed importance of to continue checking into provided resources for housing  Symptom Management: Take  medications as prescribed   Attend all scheduled provider appointments Call pharmacy for medication refills 3-7 days in advance of running out of medications Attend church or other social activities Perform all self care activities independently  Perform IADL's (shopping, preparing meals, housekeeping, managing finances) independently Call provider office for new concerns or questions  check blood pressure 3 times per week choose a place to take my blood pressure (home, clinic or office, retail store) write blood pressure results in a log or diary learn about high blood pressure keep a blood pressure log take blood pressure log to all doctor appointments call doctor for signs and symptoms of high blood pressure keep all doctor appointments take medications for blood pressure exactly as prescribed report new symptoms to your doctor eat more whole grains, fruits and vegetables, lean meats and healthy fats Look over education mailed- low sodium diet Obtain blood pressure cuff from [employer] benefit Continue to contact and follow up with agencies/ resources provided by Education officer, museum to find housing  Follow Up Plan: Telephone follow up appointment with care management team member scheduled for:   12/29/22 at 215 pm          The patient verbalized understanding of instructions, educational materials, and care plan provided today and agreed to receive a mailed copy of patient instructions, educational materials, and care plan.   Telephone follow up appointment with care management team member scheduled for:   12/29/22 at 215 pm  Heart-Healthy Eating Plan Many factors influence your heart health, including eating and exercise habits. Heart health is also called coronary health. Coronary risk increases with abnormal blood fat (lipid) levels. A heart-healthy eating plan includes limiting unhealthy fats, increasing healthy  fats, limiting salt (sodium) intake, and making other diet and  lifestyle changes. What is my plan? Your health care provider may recommend that: You limit your fat intake to _________% or less of your total calories each day. You limit your saturated fat intake to _________% or less of your total calories each day. You limit the amount of cholesterol in your diet to less than _________ mg per day. You limit the amount of sodium in your diet to less than _________ mg per day. What are tips for following this plan? Cooking Cook foods using methods other than frying. Baking, boiling, grilling, and broiling are all good options. Other ways to reduce fat include: Removing the skin from poultry. Removing all visible fats from meats. Steaming vegetables in water or broth. Meal planning  At meals, imagine dividing your plate into fourths: Fill one-half of your plate with vegetables and green salads. Fill one-fourth of your plate with whole grains. Fill one-fourth of your plate with lean protein foods. Eat 2-4 cups of vegetables per day. One cup of vegetables equals 1 cup (91 g) broccoli or cauliflower florets, 2 medium carrots, 1 large bell pepper, 1 large sweet potato, 1 large tomato, 1 medium white potato, 2 cups (150 g) raw leafy greens. Eat 1-2 cups of fruit per day. One cup of fruit equals 1 small apple, 1 large banana, 1 cup (237 g) mixed fruit, 1 large orange,  cup (82 g) dried fruit, 1 cup (240 mL) 100% fruit juice. Eat more foods that contain soluble fiber. Examples include apples, broccoli, carrots, beans, peas, and barley. Aim to get 25-30 g of fiber per day. Increase your consumption of legumes, nuts, and seeds to 4-5 servings per week. One serving of dried beans or legumes equals  cup (90 g) cooked, 1 serving of nuts is  oz (12 almonds, 24 pistachios, or 7 walnut halves), and 1 serving of seeds equals  oz (8 g). Fats Choose healthy fats more often. Choose monounsaturated and polyunsaturated fats, such as olive and canola oils, avocado oil,  flaxseeds, walnuts, almonds, and seeds. Eat more omega-3 fats. Choose salmon, mackerel, sardines, tuna, flaxseed oil, and ground flaxseeds. Aim to eat fish at least 2 times each week. Check food labels carefully to identify foods with trans fats or high amounts of saturated fat. Limit saturated fats. These are found in animal products, such as meats, butter, and cream. Plant sources of saturated fats include palm oil, palm kernel oil, and coconut oil. Avoid foods with partially hydrogenated oils in them. These contain trans fats. Examples are stick margarine, some tub margarines, cookies, crackers, and other baked goods. Avoid fried foods. General information Eat more home-cooked food and less restaurant, buffet, and fast food. Limit or avoid alcohol. Limit foods that are high in added sugar and simple starches such as foods made using white refined flour (white breads, pastries, sweets). Lose weight if you are overweight. Losing just 5-10% of your body weight can help your overall health and prevent diseases such as diabetes and heart disease. Monitor your sodium intake, especially if you have high blood pressure. Talk with your health care provider about your sodium intake. Try to incorporate more vegetarian meals weekly. What foods should I eat? Fruits All fresh, canned (in natural juice), or frozen fruits. Vegetables Fresh or frozen vegetables (raw, steamed, roasted, or grilled). Green salads. Grains Most grains. Choose whole wheat and whole grains most of the time. Rice and pasta, including brown rice and pastas made with  whole wheat. Meats and other proteins Lean, well-trimmed beef, veal, pork, and lamb. Chicken and [place] without skin. All fish and shellfish. Wild duck, rabbit, pheasant, and venison. Egg whites or low-cholesterol egg substitutes. Dried beans, peas, lentils, and tofu. Seeds and most nuts. Dairy Low-fat or nonfat cheeses, including ricotta and mozzarella. Skim or 1% milk  (liquid, powdered, or evaporated). Buttermilk made with low-fat milk. Nonfat or low-fat yogurt. Fats and oils Non-hydrogenated (trans-free) margarines. Vegetable oils, including soybean, sesame, sunflower, olive, avocado, peanut, safflower, corn, canola, and cottonseed. Salad dressings or mayonnaise made with a vegetable oil. Beverages Water (mineral or sparkling). Coffee and tea. Unsweetened ice tea. Diet beverages. Sweets and desserts Sherbet, gelatin, and fruit ice. Small amounts of dark chocolate. Limit all sweets and desserts. Seasonings and condiments All seasonings and condiments. The items listed above may not be a complete list of foods and beverages you can eat. Contact a dietitian for more options. What foods should I avoid? Fruits Canned fruit in heavy syrup. Fruit in cream or butter sauce. Fried fruit. Limit coconut. Vegetables Vegetables cooked in cheese, cream, or butter sauce. Fried vegetables. Grains Breads made with saturated or trans fats, oils, or whole milk. Croissants. Sweet rolls. Donuts. High-fat crackers, such as cheese crackers and chips. Meats and other proteins Fatty meats, such as hot dogs, ribs, sausage, bacon, rib-eye roast or steak. High-fat deli meats, such as salami and bologna. Caviar. Domestic duck and goose. Organ meats, such as liver. Dairy Cream, sour cream, cream cheese, and creamed cottage cheese. Whole-milk cheeses. Whole or 2% milk (liquid, evaporated, or condensed). Whole buttermilk. Cream sauce or high-fat cheese sauce. Whole-milk yogurt. Fats and oils Meat fat, or shortening. Cocoa butter, hydrogenated oils, palm oil, coconut oil, palm kernel oil. Solid fats and shortenings, including bacon fat, salt pork, lard, and butter. Nondairy cream substitutes. Salad dressings with cheese or sour cream. Beverages Regular sodas and any drinks with added sugar. Sweets and desserts Frosting. Pudding. Cookies. Cakes. Pies. Milk chocolate or white chocolate.  Buttered syrups. Full-fat ice cream or ice cream drinks. The items listed above may not be a complete list of foods and beverages to avoid. Contact a dietitian for more information. Summary Heart-healthy meal planning includes limiting unhealthy fats, increasing healthy fats, limiting salt (sodium) intake and making other diet and lifestyle changes. Lose weight if you are overweight. Losing just 5-10% of your body weight can help your overall health and prevent diseases such as diabetes and heart disease. Focus on eating a balance of foods, including fruits and vegetables, low-fat or nonfat dairy, lean protein, nuts and legumes, whole grains, and heart-healthy oils and fats. This information is not intended to replace advice given to you by your health care provider. Make sure you discuss any questions you have with your health care provider. Document Revised: 09/12/2021 Document Reviewed: 09/12/2021 Elsevier Patient Education  [place]. Low-Sodium Eating Plan Sodium, which is an element that makes up salt, helps you maintain a healthy balance of fluids in your body. Too much sodium can increase your blood pressure and cause fluid and waste to be held in your body. Your health care provider or dietitian may recommend following this plan if you have high blood pressure (hypertension), kidney disease, liver disease, or heart failure. Eating less sodium can help lower your blood pressure, reduce swelling, and protect your heart, liver, and kidneys. What are tips for following this plan? Reading food labels The Nutrition Facts label lists the amount of sodium in one  serving of the food. If you eat more than one serving, you must multiply the listed amount of sodium by the number of servings. Choose foods with less than 140 mg of sodium per serving. Avoid foods with 300 mg of sodium or more per serving. Shopping  Look for lower-sodium products, often labeled as "low-sodium" or "no salt  added." Always check the sodium content, even if foods are labeled as "unsalted" or "no salt added." Buy fresh foods. Avoid canned foods and pre-made or frozen meals. Avoid canned, cured, or processed meats. Buy breads that have less than 80 mg of sodium per slice. Cooking  Eat more home-cooked food and less restaurant, buffet, and fast food. Avoid adding salt when cooking. Use salt-free seasonings or herbs instead of table salt or sea salt. Check with your health care provider or pharmacist before using salt substitutes. Cook with plant-based oils, such as canola, sunflower, or olive oil. Meal planning When eating at a restaurant, ask that your food be prepared with less salt or no salt, if possible. Avoid dishes labeled as brined, pickled, cured, smoked, or made with soy sauce, miso, or teriyaki sauce. Avoid foods that contain MSG (monosodium glutamate). MSG is sometimes added to [place] food, bouillon, and some canned foods. Make meals that can be grilled, baked, poached, roasted, or steamed. These are generally made with less sodium. General information Most people on this plan should limit their sodium intake to 1,500-2,000 mg (milligrams) of sodium each day. What foods should I eat? Fruits Fresh, frozen, or canned fruit. Fruit juice. Vegetables Fresh or frozen vegetables. "No salt added" canned vegetables. "No salt added" tomato sauce and paste. Low-sodium or reduced-sodium tomato and vegetable juice. Grains Low-sodium cereals, including oats, puffed wheat and rice, and shredded wheat. Low-sodium crackers. Unsalted rice. Unsalted pasta. Low-sodium bread. Whole-grain breads and whole-grain pasta. Meats and other proteins Fresh or frozen (no salt added) meat, poultry, seafood, and fish. Low-sodium canned tuna and salmon. Unsalted nuts. Dried peas, beans, and lentils without added salt. Unsalted canned beans. Eggs. Unsalted nut butters. Dairy Milk. Soy milk. Cheese that is naturally low  in sodium, such as ricotta cheese, fresh mozzarella, or Swiss cheese. Low-sodium or reduced-sodium cheese. Cream cheese. Yogurt. Seasonings and condiments Fresh and dried herbs and spices. Salt-free seasonings. Low-sodium mustard and ketchup. Sodium-free salad dressing. Sodium-free light mayonnaise. Fresh or refrigerated horseradish. Lemon juice. Vinegar. Other foods Homemade, reduced-sodium, or low-sodium soups. Unsalted popcorn and pretzels. Low-salt or salt-free chips. The items listed above may not be a complete list of foods and beverages you can eat. Contact a dietitian for more information. What foods should I avoid? Vegetables Sauerkraut, pickled vegetables, and relishes. Olives. [place] fries. Onion rings. Regular canned vegetables (not low-sodium or reduced-sodium). Regular canned tomato sauce and paste (not low-sodium or reduced-sodium). Regular tomato and vegetable juice (not low-sodium or reduced-sodium). Frozen vegetables in sauces. Grains Instant hot cereals. Bread stuffing, pancake, and biscuit mixes. Croutons. Seasoned rice or pasta mixes. Noodle soup cups. Boxed or frozen macaroni and cheese. Regular salted crackers. Self-rising flour. Meats and other proteins Meat or fish that is salted, canned, smoked, spiced, or pickled. Precooked or cured meat, such as sausages or meat loaves. Berniece Salines. Ham. Pepperoni. Hot dogs. Corned beef. Chipped beef. Salt pork. Jerky. Pickled herring. Anchovies and sardines. Regular canned tuna. Salted nuts. Dairy Processed cheese and cheese spreads. Hard cheeses. Cheese curds. Blue cheese. Feta cheese. String cheese. Regular cottage cheese. Buttermilk. Canned milk. Fats and oils Salted butter. Regular margarine. Ghee.  Bacon fat. Seasonings and condiments Onion salt, garlic salt, seasoned salt, table salt, and sea salt. Canned and packaged gravies. Worcestershire sauce. Tartar sauce. Barbecue sauce. Teriyaki sauce. Soy sauce, including reduced-sodium. Steak  sauce. Fish sauce. Oyster sauce. Cocktail sauce. Horseradish that you find on the shelf. Regular ketchup and mustard. Meat flavorings and tenderizers. Bouillon cubes. Hot sauce. Pre-made or packaged marinades. Pre-made or packaged taco seasonings. Relishes. Regular salad dressings. Salsa. Other foods Salted popcorn and pretzels. Corn chips and puffs. Potato and tortilla chips. Canned or dried soups. Pizza. Frozen entrees and pot pies. The items listed above may not be a complete list of foods and beverages you should avoid. Contact a dietitian for more information. Summary Eating less sodium can help lower your blood pressure, reduce swelling, and protect your heart, liver, and kidneys. Most people on this plan should limit their sodium intake to 1,500-2,000 mg (milligrams) of sodium each day. Canned, boxed, and frozen foods are high in sodium. Restaurant foods, fast foods, and pizza are also very high in sodium. You also get sodium by adding salt to food. Try to cook at home, eat more fresh fruits and vegetables, and eat less fast food and canned, processed, or prepared foods. This information is not intended to replace advice given to you by your health care provider. Make sure you discuss any questions you have with your health care provider. Document Revised: 07/14/2019 Document Reviewed: 07/09/2019 Elsevier Patient Education  [place].

## 2022-11-19 DIAGNOSIS — E785 Hyperlipidemia, unspecified: Secondary | ICD-10-CM

## 2022-11-19 DIAGNOSIS — F17210 Nicotine dependence, cigarettes, uncomplicated: Secondary | ICD-10-CM | POA: Diagnosis not present

## 2022-11-19 DIAGNOSIS — I10 Essential (primary) hypertension: Secondary | ICD-10-CM | POA: Diagnosis not present

## 2022-11-21 ENCOUNTER — Other Ambulatory Visit: Payer: 59 | Admitting: Pharmacist

## 2022-11-21 NOTE — Progress Notes (Signed)
11/21/2022 Name: [name] MRN: [id number] DOB: Jan 15, 1961  Chief Complaint  Patient presents with   Medication Management   Hypertension   Hyperlipidemia    [name] is a 62 y.o. year old female who presented for a telephone visit.   They were referred to the pharmacist by their PCP for assistance in managing diabetes, hypertension, and hyperlipidemia.    Subjective:  Care Team: Primary Care Provider: [name], FNP ; Next Scheduled Visit: 01/2023  Medication Access/Adherence  Current Pharmacy:  [facility] [place] [address] Phone: [phone] Fax: [phone]   Patient reports affordability concerns with their medications: No  Patient reports access/transportation concerns to their pharmacy: No  Patient reports adherence concerns with their medications:  No    Notes that when she is due for refills, she will receive medications mailed to her in adherence packaging  Hypertension and CKD:   Current medications: telmisartan 80 mg, amlodipine 10 mg daily, spironolactone 25 mg daily  Patient does not have a validated, automated, upper arm home BP cuff. Plans to go to [place] today to use her OTC benefits to purchase one.   Patient denies hypotensive s/sx including dizziness, lightheadedness.  Patient denies hypertensive symptoms including headache, chest pain, shortness of breath. Does report her SBP was in 180s at work yesterday, though she forgot to take her medications before going in. She's feeling better today.   Hyperlipidemia/ASCVD Risk Reduction  Current lipid lowering medications: atorvastatin 40 mg daily  Antiplatelet regimen: aspirin 81 mg daily   Objective:  Lab Results  Component Value Date   HGBA1C 4.9 08/23/2021    Lab Results  Component Value Date   CREATININE 1.93 (H) 09/21/2022   BUN 39 (H) 09/21/2022   NA 139 09/21/2022   K 4.7 09/21/2022   CL 105 09/21/2022    CO2 21 09/21/2022    Lab Results  Component Value Date   CHOL 202 (H) 09/21/2022   HDL 73 09/21/2022   LDLCALC 110 (H) 09/21/2022   TRIG 109 09/21/2022   CHOLHDL 2.8 09/21/2022    Medications Reviewed Today     Reviewed by Osker Mason, RPH-CPP (Pharmacist) on 11/21/22 at 1134  Med List Status: <None>   Medication Order Taking? Sig Documenting Provider Last Dose Status Informant  acetaminophen (TYLENOL) 325 MG tablet [id number]  Take 650 mg by mouth every 6 (six) hours as needed for mild pain or headache. [provider]  Active Self  amLODipine (NORVASC) 10 MG tablet [id number] Yes Take 1 tablet (10 mg total) by mouth daily. [name], FNP Taking Active   aspirin 81 MG chewable tablet [id number] Yes Chew 1 tablet (81 mg total) by mouth daily. [name], FNP Taking Active   atorvastatin (LIPITOR) 40 MG tablet [id number] Yes Take 1 tablet (40 mg total) by mouth daily. [name], FNP Taking Active   dapagliflozin propanediol (FARXIGA) 10 MG TABS tablet [id number] Yes Take 1 tablet (10 mg total) by mouth daily. [name], FNP Taking Active   gabapentin (NEURONTIN) 100 MG capsule [id number] Yes Take 2 capsules (200 mg total) by mouth 2 (two) times daily. [name], FNP Taking Active            Med Note Jodi Mourning, Tymara Saur T   Tue Nov 21, 2022 11:33 AM)    spironolactone (ALDACTONE) 25 MG tablet [id number] Yes Take 1 tablet (25 mg total) by mouth daily. [name], FNP Taking Active   telmisartan ([place])  80 MG tablet [id number] Yes Take 1 tablet (80 mg total) by mouth daily. [name], FNP Taking Active               Assessment/Plan:   Hypertension: - Currently uncontrolled - Reviewed long term cardiovascular and renal outcomes of uncontrolled blood pressure - Reviewed appropriate blood pressure monitoring technique and reviewed goal blood pressure. Recommended to check home blood pressure and heart rate daily. She is going to purchase a cuff  today - Recommend to continue current regimen. If home, resting BP on therapy remain elevated, recommend to increase telmisartan dose   Hyperlipidemia/ASCVD Risk Reduction: - Currently uncontrolled but anticipate improvement with adherence - Recommend to continue current regimen at this time   Follow Up Plan: phone call in 4 weeks  Catie Hedwig Morton, PharmD, [place], [facility] [phone]

## 2022-12-02 ENCOUNTER — Other Ambulatory Visit: Payer: Self-pay | Admitting: Nurse Practitioner

## 2022-12-02 DIAGNOSIS — I152 Hypertension secondary to endocrine disorders: Secondary | ICD-10-CM

## 2022-12-26 ENCOUNTER — Other Ambulatory Visit: Payer: 59 | Admitting: Pharmacist

## 2022-12-26 ENCOUNTER — Other Ambulatory Visit: Payer: Self-pay

## 2022-12-26 ENCOUNTER — Other Ambulatory Visit (HOSPITAL_COMMUNITY): Payer: Self-pay

## 2022-12-26 NOTE — Progress Notes (Signed)
12/26/2022 Name: [name] MRN: [id number] DOB: 16-Apr-1961  Chief Complaint  Patient presents with   Medication Management   Hypertension   Hyperlipidemia    [name] is a 62 y.o. year old female who presented for a telephone visit.   They were referred to the pharmacist by a quality report for assistance in managing complex medication management.    Subjective:  Care Team: Primary Care Provider: [name], FNP ; Next Scheduled Visit: 01/23/23  Medication Access/Adherence  Current Pharmacy:  [facility] [address] [place] [address] Phone: [phone] Fax: [phone]  [id number] [place], [place] - [address] AT [facility] [address] [place] [address] Phone: [phone] Fax: [phone]   Patient reports affordability concerns with their medications: No  Patient reports access/transportation concerns to their pharmacy: No  Patient reports adherence concerns with their medications:  No     Hypertension and CKD:  Current medications: telmisartan 80 mg daily, amlodipine 10 mg daily, spironolactone  Patient does not have a validated, automated, upper arm home BP cuff. Reports that she has not gotten a home BP cuff yet, and just checks her BP at her work. Reports readings remain elevated at home, but does not tell me any specific readings.   Patient reports hypertensive symptoms including headache, chest pain, shortness of breath  Hyperlipidemia/ASCVD Risk Reduction  Current lipid lowering medications: atorvastatin 40 mg daily  Antiplatelet regimen: aspirin 81 mg daily   Objective:  Lab Results  Component Value Date   HGBA1C 4.9 08/23/2021    Lab Results  Component Value Date   CREATININE 1.93 (H) 09/21/2022   BUN 39 (H) 09/21/2022   NA 139 09/21/2022   K 4.7 09/21/2022   CL 105 09/21/2022   CO2 21 09/21/2022    Lab Results  Component Value Date   CHOL 202  (H) 09/21/2022   HDL 73 09/21/2022   LDLCALC 110 (H) 09/21/2022   TRIG 109 09/21/2022   CHOLHDL 2.8 09/21/2022    Medications Reviewed Today     Reviewed by Alden Hipp, RPH-CPP (Pharmacist) on 11/21/22 at 1134  Med List Status: <None>   Medication Order Taking? Sig Documenting Provider Last Dose Status Informant  acetaminophen (TYLENOL) 325 MG tablet [id number]  Take 650 mg by mouth every 6 (six) hours as needed for mild pain or headache. [provider]  Active Self  amLODipine (NORVASC) 10 MG tablet [id number] Yes Take 1 tablet (10 mg total) by mouth daily. [name], FNP Taking Active   aspirin 81 MG chewable tablet [id number] Yes Chew 1 tablet (81 mg total) by mouth daily. [name], FNP Taking Active   atorvastatin (LIPITOR) 40 MG tablet [id number] Yes Take 1 tablet (40 mg total) by mouth daily. [name], FNP Taking Active   dapagliflozin propanediol (FARXIGA) 10 MG TABS tablet [id number] Yes Take 1 tablet (10 mg total) by mouth daily. [name], FNP Taking Active   gabapentin (NEURONTIN) 100 MG capsule [id number] Yes Take 2 capsules (200 mg total) by mouth 2 (two) times daily. [name], FNP Taking Active            Med Note Clearance Coots, Philbert Ocallaghan T   Tue Nov 21, 2022 11:33 AM)    spironolactone (ALDACTONE) 25 MG tablet [id number] Yes Take 1 tablet (25 mg total) by mouth daily. [name], FNP Taking Active   telmisartan (MICARDIS) 80 MG tablet [id number] Yes Take 1 tablet (80  mg total) by mouth daily. [name], FNP Taking Active               Assessment/Plan:   Hypertension: - Currently uncontrolled - Reviewed long term cardiovascular and renal outcomes of uncontrolled blood pressure - Reviewed appropriate blood pressure monitoring technique and reviewed goal blood pressure. Recommended to check home blood pressure and heart rate periodically - Recommend to continue plan to pursue adherence packaging.   Hyperlipidemia/ASCVD Risk  Reduction: - Currently uncontrolled.  - Recommend to continue plan to pursue adherence packaging.   Follow Up Plan: phone call in 2 weeks  Catie Eppie Gibson, PharmD, BCACP, CPP [facility] [phone]

## 2022-12-27 ENCOUNTER — Other Ambulatory Visit: Payer: Self-pay

## 2022-12-29 ENCOUNTER — Telehealth: Payer: Self-pay | Admitting: *Deleted

## 2022-12-29 ENCOUNTER — Telehealth: Payer: 59

## 2022-12-29 ENCOUNTER — Other Ambulatory Visit: Payer: Self-pay

## 2022-12-29 NOTE — Telephone Encounter (Signed)
   CCM RN Visit Note   12/29/22 Name: [name] MRN: [id number]      DOB: 09-Jul-1961  Subjective: [name] is a 62 y.o. year old female who is a primary care patient of [name] FNP. The patient was referred to the Chronic Care Management team for assistance with care management needs subsequent to provider initiation of CCM services and plan of care.      An unsuccessful telephone outreach was attempted today to contact the patient about Chronic Care Management needs.    Plan:Telephone follow up appointment with care management team member scheduled for:  upon care guide rescheduling.  Irving Shows [facility], [place], BSN RN Case Manager [facility] [phone]

## 2023-01-03 ENCOUNTER — Other Ambulatory Visit (HOSPITAL_BASED_OUTPATIENT_CLINIC_OR_DEPARTMENT_OTHER): Payer: Self-pay

## 2023-01-23 ENCOUNTER — Encounter: Payer: 59 | Admitting: Nurse Practitioner

## 2023-01-24 ENCOUNTER — Other Ambulatory Visit: Payer: 59 | Admitting: Pharmacist

## 2023-01-24 NOTE — Progress Notes (Deleted)
   01/24/2023 Name: [name] MRN: [id number] DOB: October 24, 1960  Chief Complaint  Patient presents with   Medication Management   Hypertension   Hyperlipidemia    [name] is a 62 y.o. year old female who presented for a telephone visit.   They were referred to the pharmacist by {referredtopharmacy:27270} for assistance in managing {referralreason:27271}.   Patient is participating in a Managed Medicaid Plan:  {MM YES/NO:27447::"Yes"}  Subjective:  Care Team: Primary Care Provider: [name], FNP ; Next Scheduled Visit: *** {careteamprovider:27366}  Medication Access/Adherence  Current Pharmacy:  [facility] [address] [place] [address] Phone: [phone] Fax: [phone]  [facility] #[id number] [place], [place] - [address] AT [facility] [address] [place] [address] Phone: [phone] Fax: [phone]   Patient reports affordability concerns with their medications: {YES/NO:21197} Patient reports access/transportation concerns to their pharmacy: {YES/NO:21197} Patient reports adherence concerns with their medications:  {YES/NO:21197} ***   {Pharmacy S/O Choices:26420}   Objective:  Lab Results  Component Value Date   HGBA1C 4.9 08/23/2021    Lab Results  Component Value Date   CREATININE 1.93 (H) 09/21/2022   BUN 39 (H) 09/21/2022   NA 139 09/21/2022   K 4.7 09/21/2022   CL 105 09/21/2022   CO2 21 09/21/2022    Lab Results  Component Value Date   CHOL 202 (H) 09/21/2022   HDL 73 09/21/2022   LDLCALC 110 (H) 09/21/2022   TRIG 109 09/21/2022   CHOLHDL 2.8 09/21/2022    Medications Reviewed Today     Reviewed by Alden Hipp, RPH-CPP (Pharmacist) on 12/26/22 at 1421  Med List Status: <None>   Medication Order Taking? Sig Documenting Provider Last Dose Status Informant  acetaminophen (TYLENOL) 325 MG tablet [id number]  Take 650 mg by mouth every 6 (six) hours as  needed for mild pain or headache. [provider]  Active Self  amLODipine (NORVASC) 10 MG tablet [id number] Yes Take 1 tablet (10 mg total) by mouth daily. [name], FNP Taking Active   aspirin 81 MG chewable tablet [id number] Yes Chew 1 tablet (81 mg total) by mouth daily. [name], FNP Taking Active   atorvastatin (LIPITOR) 40 MG tablet [id number] Yes Take 1 tablet (40 mg total) by mouth daily. [name], FNP Taking Active   FARXIGA 10 MG TABS tablet [id number] Yes TAKE 1 TABLET(10 MG) BY MOUTH DAILY [name], FNP Taking Active   gabapentin (NEURONTIN) 100 MG capsule [id number] Yes Take 2 capsules (200 mg total) by mouth 2 (two) times daily. [name], FNP Taking Active            Med Note Clearance Coots, Ole Lafon T   Tue Nov 21, 2022 11:33 AM)    spironolactone (ALDACTONE) 25 MG tablet [id number] Yes Take 1 tablet (25 mg total) by mouth daily. [name], FNP Taking Active   telmisartan (MICARDIS) 80 MG tablet [id number] Yes Take 1 tablet (80 mg total) by mouth daily. [name], FNP Taking Active               Assessment/Plan:   {Pharmacy A/P Choices:26421}  Follow Up Plan: ***  ***

## 2023-01-26 ENCOUNTER — Other Ambulatory Visit: Payer: Self-pay

## 2023-01-26 ENCOUNTER — Other Ambulatory Visit (HOSPITAL_COMMUNITY): Payer: Self-pay

## 2023-01-26 MED FILL — Dapagliflozin Propanediol Tab 10 MG (Base Equivalent): ORAL | 30 days supply | Qty: 30 | Fill #0 | Status: CN

## 2023-01-31 ENCOUNTER — Other Ambulatory Visit: Payer: Self-pay

## 2023-02-02 NOTE — Progress Notes (Signed)
Attempted to call patient for scheduled appointment. She asked me to call back later in the afternoon. She did not answer at this time.   Will reschedule.   Catie Eppie Gibson, PharmD, BCACP, CPP [facility] [phone]

## 2023-02-06 ENCOUNTER — Other Ambulatory Visit: Payer: Self-pay

## 2023-02-09 ENCOUNTER — Other Ambulatory Visit (HOSPITAL_BASED_OUTPATIENT_CLINIC_OR_DEPARTMENT_OTHER): Payer: Self-pay

## 2023-02-19 ENCOUNTER — Encounter: Payer: Self-pay | Admitting: Nurse Practitioner

## 2023-02-19 ENCOUNTER — Telehealth: Payer: Self-pay | Admitting: *Deleted

## 2023-02-19 ENCOUNTER — Ambulatory Visit
Admission: RE | Admit: 2023-02-19 | Discharge: 2023-02-19 | Disposition: A | Discharge status: Home / Self Care | Payer: 59 | Source: Ambulatory Visit | Attending: Nurse Practitioner | Admitting: Nurse Practitioner

## 2023-02-19 ENCOUNTER — Ambulatory Visit (INDEPENDENT_AMBULATORY_CARE_PROVIDER_SITE_OTHER): Payer: 59 | Admitting: Nurse Practitioner

## 2023-02-19 ENCOUNTER — Telehealth: Payer: 59

## 2023-02-19 VITALS — BP 140/74 | HR 85 | Temp 98.9°F | Ht 68.0 in | Wt 201.8 lb

## 2023-02-19 DIAGNOSIS — I739 Peripheral vascular disease, unspecified: Secondary | ICD-10-CM

## 2023-02-19 DIAGNOSIS — F17210 Nicotine dependence, cigarettes, uncomplicated: Secondary | ICD-10-CM | POA: Diagnosis not present

## 2023-02-19 DIAGNOSIS — I5189 Other ill-defined heart diseases: Secondary | ICD-10-CM | POA: Diagnosis not present

## 2023-02-19 DIAGNOSIS — R109 Unspecified abdominal pain: Secondary | ICD-10-CM

## 2023-02-19 DIAGNOSIS — Z720 Tobacco use: Secondary | ICD-10-CM

## 2023-02-19 DIAGNOSIS — R140 Abdominal distension (gaseous): Secondary | ICD-10-CM | POA: Diagnosis not present

## 2023-02-19 DIAGNOSIS — I129 Hypertensive chronic kidney disease with stage 1 through stage 4 chronic kidney disease, or unspecified chronic kidney disease: Secondary | ICD-10-CM

## 2023-02-19 DIAGNOSIS — E782 Mixed hyperlipidemia: Secondary | ICD-10-CM

## 2023-02-19 DIAGNOSIS — N183 Chronic kidney disease, stage 3 unspecified: Secondary | ICD-10-CM

## 2023-02-19 DIAGNOSIS — R011 Cardiac murmur, unspecified: Secondary | ICD-10-CM

## 2023-02-19 DIAGNOSIS — R0600 Dyspnea, unspecified: Secondary | ICD-10-CM | POA: Diagnosis not present

## 2023-02-19 DIAGNOSIS — R1110 Vomiting, unspecified: Secondary | ICD-10-CM | POA: Diagnosis not present

## 2023-02-19 DIAGNOSIS — F5089 Other specified eating disorder: Secondary | ICD-10-CM

## 2023-02-19 MED ORDER — HYDROCORTISONE ACETATE 25 MG RE SUPP: 25.0000 mg | Freq: Two times a day (BID) | RECTAL | 1 refills | Status: DC

## 2023-02-19 MED ORDER — ONDANSETRON HCL 4 MG PO TABS: 4.0000 mg | ORAL_TABLET | Freq: Every day | ORAL | 1 refills | Status: DC | PRN

## 2023-02-19 NOTE — Assessment & Plan Note (Signed)
I do not recall hearing a murmur previously however with her abdomen distention and history of CHF will check EKG and refer back to Cardiology

## 2023-02-19 NOTE — Progress Notes (Signed)
Madelaine Bhat, CMA,acting as a Neurosurgeon for [name], FNP.,have documented all relevant documentation on the behalf of [name], FNP,as directed by  [name], FNP while in the presence of [name], FNP.  Subjective:  Patient ID: [name] , female    DOB: [birth year] , 62 y.o.   MRN: [id number]  Chief Complaint  Patient presents with   Hypertension    HPI  Patient presents today for a BP and Chol check. Patient reports compliance with medications. Patient denies any chest pain, SOB, and headaches.   Patient reports she having abdominal pain, Patient reports she can't eat, she reports throwing up since last Thursday but was able to eat an sandwich today. Patient reports no diarrhea, she reports her stomach is swollen. She drinks about  She eats argo corn starch  She reports she feels like she is losing circulation in her legs.   BP Readings from Last 3 Encounters: 02/19/23 : (!) 140/74 09/21/22 : (!) 160/100 05/02/22 : (!) 168/98       Past Medical History:  Diagnosis Date   Adrenal adenoma 01/11/2021   Anemia    CHF (congestive heart failure) (HCC)    Chronic bronchitis (HCC)    "probably once/yr" (05/12/2013)   Diastolic heart failure    Dyslipidemia    pt denies this hx on 05/12/2013   Exertional shortness of breath    GERD (gastroesophageal reflux disease)    Hypertension    Hypokalemia    Left ventricular hypertrophy 01/11/2021   Venous stasis ulcers (HCC)      Family History  Problem Relation Age of Onset   Hypertension Mother    Kidney failure Mother    Heart attack Father    Hypertension Sister    Diabetes Sister    Hypertension Brother    Diabetes Sister      Current Outpatient Medications:    acetaminophen (TYLENOL) 325 MG tablet, Take 650 mg by mouth every 6 (six) hours as needed for mild pain or headache., Disp: , Rfl:    amLODipine (NORVASC) 10 MG tablet, Take 1 tablet (10 mg total) by mouth daily., Disp: 90 tablet, Rfl: 1    atorvastatin (LIPITOR) 40 MG tablet, Take 1 tablet (40 mg total) by mouth daily., Disp: 90 tablet, Rfl: 1   Bismuth/Metronidaz/Tetracyclin (PYLERA) 140-125-125 MG CAPS, Take 3 capsules by mouth 4 (four) times daily -  before meals and at bedtime for 10 days., Disp: 120 capsule, Rfl: 0   FARXIGA 10 MG TABS tablet, Take 1 tablet (10 mg total) by mouth daily., Disp: 90 tablet, Rfl: 1   gabapentin (NEURONTIN) 100 MG capsule, Take 2 capsules (200 mg total) by mouth 2 (two) times daily., Disp: 120 capsule, Rfl: 5   spironolactone (ALDACTONE) 25 MG tablet, Take 1 tablet (25 mg total) by mouth daily., Disp: 90 tablet, Rfl: 1   carvedilol (COREG) 6.25 MG tablet, Take 1 tablet (6.25 mg total) by mouth 2 (two) times daily with a meal., Disp: 60 tablet, Rfl: 3   ferrous gluconate (FERGON) 324 MG tablet, Take 1 tablet (324 mg total) by mouth daily with breakfast., Disp: 30 tablet, Rfl: 3   hydrALAZINE (APRESOLINE) 50 MG tablet, Take 1 tablet (50 mg total) by mouth 3 (three) times daily., Disp: 90 tablet, Rfl: 2   hydrocortisone (ANUSOL-HC) 25 MG suppository, Place 1 suppository (25 mg total) rectally every 12 (twelve) hours., Disp: 60 suppository, Rfl: 1   isosorbide mononitrate (IMDUR) 60 MG 24 hr tablet, Take  1 tablet (60 mg total) by mouth daily., Disp: 30 tablet, Rfl: 3   ondansetron (ZOFRAN) 4 MG tablet, Take 1 tablet (4 mg total) by mouth every 8 (eight) hours as needed for nausea or vomiting., Disp: 30 tablet, Rfl: 1   pantoprazole (PROTONIX) 40 MG tablet, Take 1 tablet (40 mg total) by mouth 2 (two) times daily before a meal. Twice a day X 2 months, then continue daily, Disp: 60 tablet, Rfl: 3   polyethylene glycol (MIRALAX / GLYCOLAX) 17 g packet, Take 17 g by mouth daily., Disp: , Rfl:    sucralfate (CARAFATE) 1 g tablet, Take 1 tablet (1 g total) by mouth 2 (two) times daily for 14 days., Disp: 28 tablet, Rfl: 0   Allergies  Allergen Reactions   Lisinopril Cough     Review of Systems   Constitutional: Negative.   Respiratory: Negative.    Cardiovascular: Negative.   Genitourinary: Negative.   Musculoskeletal: Negative.   Skin: Negative.   Neurological: Negative.   Hematological: Negative.   Psychiatric/Behavioral: Negative.       Today's Vitals   02/19/23 0951  BP: (!) 140/74  Pulse: 85  Temp: 98.9 F (37.2 C)  TempSrc: Oral  Weight: 201 lb 12.8 oz (91.5 kg)  Height: 5\' 8"  (1.727 m)  PainSc: 0-No pain   Body mass index is 30.68 kg/m.  Wt Readings from Last 3 Encounters:  02/23/23 201 lb 11.5 oz (91.5 kg)  02/19/23 201 lb 12.8 oz (91.5 kg)  10/04/22 210 lb (95.3 kg)    The 10-year ASCVD risk score (Arnett DK, et al., 2019) is: 20%   Values used to calculate the score:     Age: 66 years     Sex: Female     Is Non-Hispanic African American: Yes     Diabetic: No     Tobacco smoker: Yes     Systolic Blood Pressure: 151 mmHg     Is BP treated: Yes     HDL Cholesterol: 73 mg/dL     Total Cholesterol: 202 mg/dL  Objective:  Physical Exam Vitals reviewed.  Constitutional:      Appearance: Normal appearance.  Cardiovascular:     Rate and Rhythm: Normal rate and regular rhythm.     Pulses: Normal pulses.     Heart sounds: Normal heart sounds. No murmur heard. Pulmonary:     Effort: Pulmonary effort is normal. No respiratory distress.     Breath sounds: Normal breath sounds. No stridor. No wheezing.  Musculoskeletal:        General: Swelling present. No tenderness.  Skin:    General: Skin is warm and dry.  Neurological:     General: No focal deficit present.     Mental Status: She is alert and oriented to person, place, and time.  Psychiatric:        Mood and Affect: Mood normal.        Behavior: Behavior normal.        Thought Content: Thought content normal.        Judgment: Judgment normal.         Assessment And Plan:  Benign hypertension with CKD (chronic kidney disease) stage III (HCC) Assessment & Plan: Blood pressure is slightly  elevated, encouraged to take medications as directed. EKG done with findings of Atrial Rhythm P:QRS - 1:1, Abnormal P axis, H Rate 72  Voltage criteria for LVH (S(V1)+R(V6) exceeds 3.50 mV).  -Nonspecific T-abnormality.  HR 72   Orders: -  CMP14+EGFR  Mixed hyperlipidemia Assessment & Plan: Continue statin, tolerating well.    Diastolic dysfunction without heart failure Assessment & Plan: She is to return to cardiology, I have placed a new order  Orders: -     Ambulatory referral to Cardiology -     EKG 12-Lead -     Brain natriuretic peptide  Claudication, intermittent (HCC) Assessment & Plan: Stable, continue f/u with vascular. She is to wear support socks during the day especially when walking long distances   Abdominal pain, unspecified abdominal location Assessment & Plan: Generalized distention and has tenderness throughout. Likely related to constipation. Will order KUB. Advised to take a stool softner   Orders: -     Iron, TIBC and Ferritin Panel -     DG Abd 1 View; Future  Vomiting, unspecified vomiting type, unspecified whether nausea present Assessment & Plan: May be related to constipation, will provide Zofran as needed.  Orders: -     CBC  Murmur Assessment & Plan: I do not recall hearing a murmur previously however with her abdomen distention and history of CHF will check EKG and refer back to Cardiology   Orders: -     Ambulatory referral to Cardiology -     EKG 12-Lead -     Brain natriuretic peptide  Pica in adults Assessment & Plan: Will check her iron levels and encouraged to avoid eating corn starch  Orders: -     Iron, TIBC and Ferritin Panel -     Zinc  Tobacco abuse Assessment & Plan: Smoking cessation instruction/counseling given:  counseled patient on the dangers of tobacco use, advised patient to stop smoking, and reviewed strategies to maximize success      Return for 6 month bp check.  Patient was given opportunity to  ask questions. Patient verbalized understanding of the plan and was able to repeat key elements of the plan. All questions were answered to their satisfaction.    Jeanell Sparrow, FNP, have reviewed all documentation for this visit. The documentation on 02/19/23 for the exam, diagnosis, procedures, and orders are all accurate and complete.   IF YOU HAVE BEEN REFERRED TO A SPECIALIST, IT MAY TAKE 1-2 WEEKS TO SCHEDULE/PROCESS THE REFERRAL. IF YOU HAVE NOT HEARD FROM US/SPECIALIST IN TWO WEEKS, PLEASE GIVE [place] A CALL AT [phone] X [phone].

## 2023-02-19 NOTE — Patient Instructions (Addendum)
Chilton Si, MD [phone] [facility] [address]  [phone]  Go to [address] for your xray of your stomach.

## 2023-02-19 NOTE — Telephone Encounter (Signed)
   CCM RN Visit Note   02/19/23 Name: Mishaal Clatterbuck MRN: [id number]      DOB: [birth year]  Subjective: Anaija Sorrenti is a 62 y.o. year old female who is a primary care patient of [name] FNP. The patient was referred to the Chronic Care Management team for assistance with care management needs subsequent to provider initiation of CCM services and plan of care.      Second unsuccessful telephone outreach was attempted today to contact the patient about Chronic Care Management needs.    Plan:Telephone follow up appointment with care management team member scheduled for:  upon care guide rescheduling.  Irving Shows [facility], BSN RN Case Manager [facility] [phone]

## 2023-02-20 ENCOUNTER — Inpatient Hospital Stay (HOSPITAL_COMMUNITY)
Admission: EM | Admit: 2023-02-20 | Discharge: 2023-02-23 | DRG: 812 | Disposition: A | Discharge status: Home / Self Care | Payer: 59 | Attending: Internal Medicine | Admitting: Internal Medicine

## 2023-02-20 ENCOUNTER — Observation Stay (HOSPITAL_COMMUNITY): Payer: 59

## 2023-02-20 ENCOUNTER — Encounter (HOSPITAL_COMMUNITY): Payer: Self-pay

## 2023-02-20 ENCOUNTER — Other Ambulatory Visit: Payer: Self-pay

## 2023-02-20 ENCOUNTER — Telehealth: Payer: Self-pay

## 2023-02-20 DIAGNOSIS — Z720 Tobacco use: Secondary | ICD-10-CM | POA: Diagnosis not present

## 2023-02-20 DIAGNOSIS — N1832 Chronic kidney disease, stage 3b: Secondary | ICD-10-CM | POA: Diagnosis present

## 2023-02-20 DIAGNOSIS — D509 Iron deficiency anemia, unspecified: Secondary | ICD-10-CM

## 2023-02-20 DIAGNOSIS — D3500 Benign neoplasm of unspecified adrenal gland: Secondary | ICD-10-CM | POA: Diagnosis present

## 2023-02-20 DIAGNOSIS — K5909 Other constipation: Secondary | ICD-10-CM | POA: Diagnosis present

## 2023-02-20 DIAGNOSIS — N179 Acute kidney failure, unspecified: Secondary | ICD-10-CM | POA: Diagnosis present

## 2023-02-20 DIAGNOSIS — Z841 Family history of disorders of kidney and ureter: Secondary | ICD-10-CM

## 2023-02-20 DIAGNOSIS — R935 Abnormal findings on diagnostic imaging of other abdominal regions, including retroperitoneum: Secondary | ICD-10-CM | POA: Diagnosis not present

## 2023-02-20 DIAGNOSIS — D638 Anemia in other chronic diseases classified elsewhere: Secondary | ICD-10-CM | POA: Diagnosis present

## 2023-02-20 DIAGNOSIS — D649 Anemia, unspecified: Secondary | ICD-10-CM | POA: Diagnosis not present

## 2023-02-20 DIAGNOSIS — K644 Residual hemorrhoidal skin tags: Secondary | ICD-10-CM | POA: Diagnosis present

## 2023-02-20 DIAGNOSIS — F17210 Nicotine dependence, cigarettes, uncomplicated: Secondary | ICD-10-CM | POA: Diagnosis not present

## 2023-02-20 DIAGNOSIS — K449 Diaphragmatic hernia without obstruction or gangrene: Secondary | ICD-10-CM | POA: Diagnosis not present

## 2023-02-20 DIAGNOSIS — K219 Gastro-esophageal reflux disease without esophagitis: Secondary | ICD-10-CM | POA: Diagnosis not present

## 2023-02-20 DIAGNOSIS — K625 Hemorrhage of anus and rectum: Secondary | ICD-10-CM | POA: Diagnosis not present

## 2023-02-20 DIAGNOSIS — I130 Hypertensive heart and chronic kidney disease with heart failure and stage 1 through stage 4 chronic kidney disease, or unspecified chronic kidney disease: Secondary | ICD-10-CM | POA: Diagnosis not present

## 2023-02-20 DIAGNOSIS — N183 Chronic kidney disease, stage 3 unspecified: Secondary | ICD-10-CM | POA: Diagnosis not present

## 2023-02-20 DIAGNOSIS — E7800 Pure hypercholesterolemia, unspecified: Secondary | ICD-10-CM | POA: Diagnosis present

## 2023-02-20 DIAGNOSIS — B9681 Helicobacter pylori [H. pylori] as the cause of diseases classified elsewhere: Secondary | ICD-10-CM | POA: Diagnosis not present

## 2023-02-20 DIAGNOSIS — K31819 Angiodysplasia of stomach and duodenum without bleeding: Secondary | ICD-10-CM | POA: Diagnosis not present

## 2023-02-20 DIAGNOSIS — Z7982 Long term (current) use of aspirin: Secondary | ICD-10-CM

## 2023-02-20 DIAGNOSIS — E872 Acidosis, unspecified: Secondary | ICD-10-CM | POA: Diagnosis not present

## 2023-02-20 DIAGNOSIS — K2970 Gastritis, unspecified, without bleeding: Secondary | ICD-10-CM | POA: Diagnosis present

## 2023-02-20 DIAGNOSIS — Z716 Tobacco abuse counseling: Secondary | ICD-10-CM

## 2023-02-20 DIAGNOSIS — Z79899 Other long term (current) drug therapy: Secondary | ICD-10-CM | POA: Diagnosis not present

## 2023-02-20 DIAGNOSIS — D62 Acute posthemorrhagic anemia: Secondary | ICD-10-CM | POA: Diagnosis not present

## 2023-02-20 DIAGNOSIS — R1110 Vomiting, unspecified: Secondary | ICD-10-CM | POA: Diagnosis not present

## 2023-02-20 DIAGNOSIS — E669 Obesity, unspecified: Secondary | ICD-10-CM | POA: Diagnosis present

## 2023-02-20 DIAGNOSIS — R932 Abnormal findings on diagnostic imaging of liver and biliary tract: Secondary | ICD-10-CM | POA: Diagnosis not present

## 2023-02-20 DIAGNOSIS — K642 Third degree hemorrhoids: Secondary | ICD-10-CM | POA: Diagnosis present

## 2023-02-20 DIAGNOSIS — Z888 Allergy status to other drugs, medicaments and biological substances status: Secondary | ICD-10-CM | POA: Diagnosis not present

## 2023-02-20 DIAGNOSIS — K635 Polyp of colon: Secondary | ICD-10-CM | POA: Diagnosis not present

## 2023-02-20 DIAGNOSIS — K7689 Other specified diseases of liver: Secondary | ICD-10-CM | POA: Diagnosis not present

## 2023-02-20 DIAGNOSIS — K2289 Other specified disease of esophagus: Secondary | ICD-10-CM | POA: Diagnosis present

## 2023-02-20 DIAGNOSIS — Z8249 Family history of ischemic heart disease and other diseases of the circulatory system: Secondary | ICD-10-CM

## 2023-02-20 DIAGNOSIS — K2950 Unspecified chronic gastritis without bleeding: Secondary | ICD-10-CM | POA: Diagnosis not present

## 2023-02-20 DIAGNOSIS — E785 Hyperlipidemia, unspecified: Secondary | ICD-10-CM | POA: Diagnosis present

## 2023-02-20 DIAGNOSIS — E269 Hyperaldosteronism, unspecified: Secondary | ICD-10-CM | POA: Diagnosis not present

## 2023-02-20 DIAGNOSIS — F5089 Other specified eating disorder: Secondary | ICD-10-CM | POA: Diagnosis present

## 2023-02-20 DIAGNOSIS — Z6830 Body mass index (BMI) 30.0-30.9, adult: Secondary | ICD-10-CM

## 2023-02-20 DIAGNOSIS — D123 Benign neoplasm of transverse colon: Secondary | ICD-10-CM | POA: Diagnosis present

## 2023-02-20 DIAGNOSIS — I5032 Chronic diastolic (congestive) heart failure: Secondary | ICD-10-CM | POA: Diagnosis present

## 2023-02-20 DIAGNOSIS — Z5986 Financial insecurity: Secondary | ICD-10-CM

## 2023-02-20 LAB — COMPREHENSIVE METABOLIC PANEL
ALT: 11 U/L (ref 0–44)
AST: 16 U/L (ref 15–41)
Albumin: 3.3 g/dL — ABNORMAL LOW (ref 3.5–5.0)
Alkaline Phosphatase: 55 U/L (ref 38–126)
Anion gap: 11 (ref 5–15)
BUN: 29 mg/dL — ABNORMAL HIGH (ref 8–23)
CO2: 18 mmol/L — ABNORMAL LOW (ref 22–32)
Calcium: 8.7 mg/dL — ABNORMAL LOW (ref 8.9–10.3)
Chloride: 110 mmol/L (ref 98–111)
Creatinine, Ser: 2.66 mg/dL — ABNORMAL HIGH (ref 0.44–1.00)
GFR, Estimated: 20 mL/min — ABNORMAL LOW (ref >60)
Glucose, Bld: 93 mg/dL (ref 70–99)
Potassium: 4.5 mmol/L (ref 3.5–5.1)
Sodium: 139 mmol/L (ref 135–145)
Total Bilirubin: 0.6 mg/dL (ref 0.3–1.2)
Total Protein: 7.7 g/dL (ref 6.5–8.1)

## 2023-02-20 LAB — TYPE AND SCREEN
Antibody Screen: NEGATIVE
Unit division: 0

## 2023-02-20 LAB — RETICULOCYTES
Immature Retic Fract: 28.8 % — ABNORMAL HIGH (ref 2.3–15.9)
RBC.: 2.9 MIL/uL — ABNORMAL LOW (ref 3.87–5.11)
Retic Count, Absolute: 75.4 10*3/uL (ref 19.0–186.0)
Retic Ct Pct: 2.6 % (ref 0.4–3.1)

## 2023-02-20 LAB — CBC
HCT: 23 % — ABNORMAL LOW (ref 36.0–46.0)
Hemoglobin: 6 g/dL — CL (ref 12.0–15.0)
MCH: 20.3 pg — ABNORMAL LOW (ref 26.0–34.0)
MCHC: 26.1 g/dL — ABNORMAL LOW (ref 30.0–36.0)
MCV: 77.7 fL — ABNORMAL LOW (ref 80.0–100.0)
Platelets: 421 10*3/uL — ABNORMAL HIGH (ref 150–400)
RBC: 2.96 MIL/uL — ABNORMAL LOW (ref 3.87–5.11)
RDW: 18.4 % — ABNORMAL HIGH (ref 11.5–15.5)
WBC: 8.1 10*3/uL (ref 4.0–10.5)
nRBC: 0 % (ref 0.0–0.2)

## 2023-02-20 LAB — I-STAT CHEM 8, ED
BUN: 30 mg/dL — ABNORMAL HIGH (ref 8–23)
Calcium, Ion: 1.11 mmol/L — ABNORMAL LOW (ref 1.15–1.40)
Chloride: 113 mmol/L — ABNORMAL HIGH (ref 98–111)
Creatinine, Ser: 2.9 mg/dL — ABNORMAL HIGH (ref 0.44–1.00)
Glucose, Bld: 93 mg/dL (ref 70–99)
HCT: 22 % — ABNORMAL LOW (ref 36.0–46.0)
Hemoglobin: 7.5 g/dL — ABNORMAL LOW (ref 12.0–15.0)
Potassium: 4.5 mmol/L (ref 3.5–5.1)
Sodium: 143 mmol/L (ref 135–145)
TCO2: 18 mmol/L — ABNORMAL LOW (ref 22–32)

## 2023-02-20 LAB — ABO/RH: ABO/RH(D): B POS

## 2023-02-20 LAB — BPAM RBC: Unit Type and Rh: 7300

## 2023-02-20 LAB — POC OCCULT BLOOD, ED: Fecal Occult Bld: NEGATIVE

## 2023-02-20 LAB — PREPARE RBC (CROSSMATCH)

## 2023-02-20 MED ORDER — SODIUM CHLORIDE 0.9% IV SOLUTION: Freq: Once | INTRAVENOUS | Status: DC

## 2023-02-20 MED ORDER — ASPIRIN 81 MG PO CHEW: 81.0000 mg | CHEWABLE_TABLET | Freq: Every day | ORAL | Status: DC

## 2023-02-20 MED ORDER — HYDROCORTISONE ACETATE 25 MG RE SUPP
25.0000 mg | Freq: Two times a day (BID) | RECTAL | Status: DC
  Filled 2023-02-20 (×2): qty 1

## 2023-02-20 MED ORDER — LINAGLIPTIN 5 MG PO TABS
5.0000 mg | ORAL_TABLET | Freq: Every day | ORAL | Status: DC
  Administered 2023-02-20 – 2023-02-23 (×4): 5 mg via ORAL
  Filled 2023-02-20 (×4): qty 1

## 2023-02-20 MED ORDER — AMLODIPINE BESYLATE 10 MG PO TABS
10.0000 mg | ORAL_TABLET | Freq: Every day | ORAL | Status: DC
  Administered 2023-02-21 – 2023-02-23 (×3): 10 mg via ORAL
  Filled 2023-02-20 (×3): qty 1

## 2023-02-20 MED ORDER — ASPIRIN 81 MG PO CHEW
81.0000 mg | CHEWABLE_TABLET | Freq: Every day | ORAL | Status: DC
  Filled 2023-02-20: qty 1

## 2023-02-20 MED ORDER — HYDRALAZINE HCL 50 MG PO TABS
25.0000 mg | ORAL_TABLET | Freq: Three times a day (TID) | ORAL | Status: DC
  Administered 2023-02-20 – 2023-02-23 (×8): 25 mg via ORAL
  Filled 2023-02-20 (×8): qty 1

## 2023-02-20 MED ORDER — GABAPENTIN 100 MG PO CAPS
200.0000 mg | ORAL_CAPSULE | Freq: Two times a day (BID) | ORAL | Status: DC
  Administered 2023-02-20 – 2023-02-23 (×6): 200 mg via ORAL
  Filled 2023-02-20 (×6): qty 2

## 2023-02-20 MED ORDER — HYDRALAZINE HCL 20 MG/ML IJ SOLN
5.0000 mg | Freq: Four times a day (QID) | INTRAMUSCULAR | Status: DC | PRN
  Administered 2023-02-20 (×2): 5 mg via INTRAVENOUS
  Filled 2023-02-20 (×2): qty 1

## 2023-02-20 MED ORDER — CARVEDILOL 6.25 MG PO TABS
3.1250 mg | ORAL_TABLET | Freq: Two times a day (BID) | ORAL | Status: DC
  Administered 2023-02-21: 3.125 mg via ORAL
  Filled 2023-02-20: qty 1

## 2023-02-20 MED ORDER — ISOSORBIDE MONONITRATE ER 30 MG PO TB24
30.0000 mg | ORAL_TABLET | Freq: Every day | ORAL | Status: DC
  Administered 2023-02-20 – 2023-02-22 (×3): 30 mg via ORAL
  Filled 2023-02-20 (×3): qty 1

## 2023-02-20 MED ORDER — ACETAMINOPHEN 325 MG PO TABS
650.0000 mg | ORAL_TABLET | Freq: Four times a day (QID) | ORAL | Status: DC | PRN
  Administered 2023-02-20 – 2023-02-23 (×4): 650 mg via ORAL
  Filled 2023-02-20 (×4): qty 2

## 2023-02-20 MED ORDER — HYDROCORTISONE ACETATE 25 MG RE SUPP
25.0000 mg | Freq: Two times a day (BID) | RECTAL | Status: DC
  Administered 2023-02-22 – 2023-02-23 (×3): 25 mg via RECTAL
  Filled 2023-02-20 (×7): qty 1

## 2023-02-20 MED ORDER — ATORVASTATIN CALCIUM 40 MG PO TABS
40.0000 mg | ORAL_TABLET | Freq: Every day | ORAL | Status: DC
  Administered 2023-02-21 – 2023-02-23 (×3): 40 mg via ORAL
  Filled 2023-02-20 (×3): qty 1

## 2023-02-20 MED ORDER — HYDROCORTISONE ACETATE 25 MG RE SUPP
25.0000 mg | Freq: Once | RECTAL | Status: AC
  Administered 2023-02-20: 25 mg via RECTAL
  Filled 2023-02-20: qty 1

## 2023-02-20 NOTE — ED Notes (Signed)
ED TO INPATIENT HANDOFF REPORT  ED Nurse Name and Phone #:   S Name/Age/Gender [name] 62 y.o. female Room/Bed: 012C/012C  Code Status   Code Status: Full Code  Home/SNF/Other Home Patient oriented to: self, place, time, and situation Is this baseline? Yes   Triage Complete: Triage complete  Chief Complaint Anemia [D64.9]  Triage Note Pt dent by PCP to get a blood transfusion, because her hemoglobin is 6.0. Pt c/o hemorrhoidal pain. Pt denies SOB or weakness.   Allergies Allergies  Allergen Reactions   Lisinopril Cough    Level of Care/Admitting Diagnosis ED Disposition     ED Disposition  Admit   Condition  --   Comment  Hospital Area: [facility] [[id number]]  Level of Care: Med-Surg [16]  May place patient in observation at [facility] or [facility] if equivalent level of care is available:: No  Covid Evaluation: Asymptomatic - no recent exposure (last 10 days) testing not required  Diagnosis: Anemia [161096]  Admitting Physician: Emeline General [[id number]]  Attending Physician: Emeline General [[id number]]          B Medical/Surgery History Past Medical History:  Diagnosis Date   Adrenal adenoma 01/11/2021   Anemia    CHF (congestive heart failure) (HCC)    Chronic bronchitis (HCC)    "probably once/yr" (05/12/2013)   Diastolic heart failure    Dyslipidemia    pt denies this hx on 05/12/2013   Exertional shortness of breath    GERD (gastroesophageal reflux disease)    Hypertension    Hypokalemia    Left ventricular hypertrophy 01/11/2021   Venous stasis ulcers (HCC)    Past Surgical History:  Procedure Laterality Date   IR US GUIDE VASC ACCESS RIGHT  02/27/2017   IR VENOGRAM ADRENAL BI  02/27/2017   IR VENOGRAM HEPATIC WO HEMODYNAMIC EVALUATION  02/27/2017   IR VENOGRAM RENAL BI  02/27/2017   IR VENOUS SAMPLING  02/27/2017   IR VENOUS SAMPLING  02/27/2017   VAGINAL HYSTERECTOMY  1990's     A IV Location/Drains/Wounds Patient  Lines/Drains/Airways Status     Active Line/Drains/Airways     Name Placement date Placement time Site Days   Peripheral IV 02/20/23 18 G Right Antecubital 02/20/23  1223  Antecubital  less than 1            Intake/Output Last 24 hours No intake or output data in the 24 hours ending 02/20/23 1344  Labs/Imaging Results for orders placed or performed during the hospital encounter of 02/20/23 (from the past 48 hour(s))  Comprehensive metabolic panel     Status: Abnormal   Collection Time: 02/20/23 11:04 AM  Result Value Ref Range   Sodium 139 135 - 145 mmol/L   Potassium 4.5 3.5 - 5.1 mmol/L   Chloride 110 98 - 111 mmol/L   CO2 18 (L) 22 - 32 mmol/L   Glucose, Bld 93 70 - 99 mg/dL    Comment: Glucose reference range applies only to samples taken after fasting for at least 8 hours.   BUN 29 (H) 8 - 23 mg/dL   Creatinine, Ser 2.95 (H) 0.44 - 1.00 mg/dL   Calcium 8.7 (L) 8.9 - 10.3 mg/dL   Total Protein 7.7 6.5 - 8.1 g/dL   Albumin 3.3 (L) 3.5 - 5.0 g/dL   AST 16 15 - 41 U/L   ALT 11 0 - 44 U/L   Alkaline Phosphatase 55 38 - 126 U/L   Total Bilirubin  0.6 0.3 - 1.2 mg/dL   GFR, Estimated 20 (L) >60 mL/min    Comment: (NOTE) Calculated using the CKD-EPI Creatinine Equation (2021)    Anion gap 11 5 - 15    Comment: Performed at [facility], [address] [place], [place] [address]  CBC     Status: Abnormal   Collection Time: 02/20/23 11:04 AM  Result Value Ref Range   WBC 8.1 4.0 - 10.5 K/uL   RBC 2.96 (L) 3.87 - 5.11 MIL/uL   Hemoglobin 6.0 (LL) 12.0 - 15.0 g/dL    Comment: REPEATED TO VERIFY Reticulocyte Hemoglobin testing may be clinically indicated, consider ordering this additional test [id number] THIS CRITICAL RESULT HAS VERIFIED AND BEEN CALLED TO L. Damaso Laday, RN BY LEAH KLAR ON 07 02 2024 AT 1214, AND HAS BEEN READ BACK.     HCT 23.0 (L) 36.0 - 46.0 %   MCV 77.7 (L) 80.0 - 100.0 fL   MCH 20.3 (L) 26.0 - 34.0 pg   MCHC 26.1 (L) 30.0 - 36.0 g/dL   RDW 56.2  (H) 13.0 - 15.5 %   Platelets 421 (H) 150 - 400 K/uL    Comment: REPEATED TO VERIFY   nRBC 0.0 0.0 - 0.2 %    Comment: Performed at [facility], [address], [place], [place] [address]  Type and screen [facility]     Status: None (Preliminary result)   Collection Time: 02/20/23 11:04 AM  Result Value Ref Range   ABO/RH(D) B POS    Antibody Screen NEG    Sample Expiration 02/23/2023,2359    Unit Number [id number]    Blood Component Type RED CELLS,LR    Unit division 00    Status of Unit ISSUED    Transfusion Status OK TO TRANSFUSE    Crossmatch Result      Compatible Performed at [facility], [address] [place], [place] [address]    Unit Number [id number]    Blood Component Type RBC LR PHER1    Unit division 00    Status of Unit ALLOCATED    Transfusion Status OK TO TRANSFUSE    Crossmatch Result Compatible   I-stat chem 8, ED (not at [facility], [facility] or [facility])     Status: Abnormal   Collection Time: 02/20/23 11:09 AM  Result Value Ref Range   Sodium 143 135 - 145 mmol/L   Potassium 4.5 3.5 - 5.1 mmol/L   Chloride 113 (H) 98 - 111 mmol/L   BUN 30 (H) 8 - 23 mg/dL   Creatinine, Ser 4.25 (H) 0.44 - 1.00 mg/dL   Glucose, Bld 93 70 - 99 mg/dL    Comment: Glucose reference range applies only to samples taken after fasting for at least 8 hours.   Calcium, Ion 1.11 (L) 1.15 - 1.40 mmol/L   TCO2 18 (L) 22 - 32 mmol/L   Hemoglobin 7.5 (L) 12.0 - 15.0 g/dL   HCT 95.6 (L) 38.7 - 56.4 %  POC occult blood, ED     Status: None   Collection Time: 02/20/23 11:30 AM  Result Value Ref Range   Fecal Occult Bld NEGATIVE NEGATIVE  ABO/Rh     Status: None   Collection Time: 02/20/23 12:22 PM  Result Value Ref Range   ABO/RH(D)      B POS Performed at [facility], [address] [place], [place] [address]   Prepare RBC (crossmatch)     Status:  None   Collection Time: 02/20/23 12:24 PM  Result Value Ref Range   Order Confirmation      ORDER  PROCESSED BY BLOOD BANK Performed at [facility], [address], [place], [place] [address]    No results found.  Pending Labs Unresulted Labs (From admission, onward)     Start     Ordered   02/21/23 0500  Basic metabolic panel  Tomorrow morning,   R        02/20/23 1330   02/21/23 0500  CBC  Tomorrow morning,   R        02/20/23 1330   02/20/23 2200  Hemoglobin and hematocrit, blood  Once-Timed,   TIMED        02/20/23 1330   02/20/23 1330  HIV Antibody (routine testing w rflx)  (HIV Antibody (Routine testing w reflex) panel)  Once,   R        02/20/23 1330   02/20/23 1328  Urinalysis, Routine w reflex microscopic -Urine, Clean Catch  Once,   URGENT       Question:  Specimen Source  Answer:  Urine, Clean Catch   02/20/23 1327   02/20/23 1237  Reticulocytes  Add-on,   AD        02/20/23 1236   02/20/23 1233  Iron and TIBC  Add-on,   AD        02/20/23 1232            Vitals/Pain Today's Vitals   02/20/23 1046 02/20/23 1047 02/20/23 1200 02/20/23 1324  BP:  (!) 190/79 (!) 181/85 (!) 182/107  Pulse:  75 80 72  Resp:  18 18 18   Temp:  98.7 F (37.1 C)  99 F (37.2 C)  TempSrc:    Oral  SpO2:  100% 100% 100%  Weight:  91.5 kg    Height:  5\' 8"  (1.727 m)    PainSc: 10-Worst pain ever       Isolation Precautions No active isolations  Medications Medications  0.9 %  sodium chloride infusion (Manually program via Guardrails IV Fluids) (has no administration in time range)  acetaminophen (TYLENOL) tablet 650 mg (has no administration in time range)  amLODipine (NORVASC) tablet 10 mg (has no administration in time range)  atorvastatin (LIPITOR) tablet 40 mg (has no administration in time range)  hydrALAZINE (APRESOLINE) injection 5 mg (has no administration in time range)  gabapentin (NEURONTIN) capsule 200 mg (has no administration in time range)  hydrocortisone (ANUSOL-HC) suppository 25 mg (has no administration in time range)  linagliptin (TRADJENTA)  tablet 5 mg (has no administration in time range)  aspirin chewable tablet 81 mg (has no administration in time range)  hydrocortisone (ANUSOL-HC) suppository 25 mg (25 mg Rectal Given 02/20/23 1159)    Mobility walks     Focused Assessments    R Recommendations: See Admitting Provider Note  Report given to:   Additional Notes:

## 2023-02-20 NOTE — H&P (Signed)
History and Physical    [name] ZOX:[id number] DOB: Aug 18, 1961 DOA: 02/20/2023  PCP: [name], FNP (Confirm with patient/family/NH records and if not entered, this has to be entered at [facility] point of entry) Patient coming from: Home  I have personally briefly reviewed patient's old medical records in [facility]  Chief Complaint: Feeling tired  HPI: [name] is a 62 y.o. female with medical history significant of HTN, chronic HFpEF, HLD, GERD, CKD stage IIIb, sent from PCP office for evaluation of anemia.  Patient developed generalized weakness malaise 3 days ago, associated with exertional dyspnea but no cough no chest pains, denies any abdominal pain, denies any rectal bleed or dark-colored stool.  He does have chronic hemorrhoid but only has rectal pain without any significant bleeding when wiping.  No weight loss.  Yesterday he went to see PCP for checkup and was found hemoglobin was low and called by PCP this morning to come to ED for transfusion. ED Course: Blood pressure elevated 170/80, nonhypoxic.  Hemoglobin 6.0, MCV 77 RDW 18, creatinine 2.9 compared to baseline 1.9 K4.5, bicarb 18  Review of Systems: As per HPI otherwise 14 point review of systems negative.    Past Medical History:  Diagnosis Date   Adrenal adenoma 01/11/2021   Anemia    CHF (congestive heart failure) (HCC)    Chronic bronchitis (HCC)    "probably once/yr" (05/12/2013)   Diastolic heart failure    Dyslipidemia    pt denies this hx on 05/12/2013   Exertional shortness of breath    GERD (gastroesophageal reflux disease)    Hypertension    Hypokalemia    Left ventricular hypertrophy 01/11/2021   Venous stasis ulcers (HCC)     Past Surgical History:  Procedure Laterality Date   IR US GUIDE VASC ACCESS RIGHT  02/27/2017   IR VENOGRAM ADRENAL BI  02/27/2017   IR VENOGRAM HEPATIC WO HEMODYNAMIC EVALUATION  02/27/2017   IR VENOGRAM RENAL BI  02/27/2017   IR VENOUS SAMPLING  02/27/2017    IR VENOUS SAMPLING  02/27/2017   VAGINAL HYSTERECTOMY  1990's     reports that she has been smoking cigarettes. She has a 5.00 pack-year smoking history. She has never used smokeless tobacco. She reports that she does not currently use alcohol after a past usage of about 7.0 standard drinks of alcohol per week. She reports that she does not use drugs.  Allergies  Allergen Reactions   Lisinopril Cough    Family History  Problem Relation Age of Onset   Hypertension Mother    Kidney failure Mother    Heart attack Father    Hypertension Sister    Diabetes Sister    Hypertension Brother    Diabetes Sister      Prior to Admission medications   Medication Sig Start Date End Date Taking? Authorizing Provider  acetaminophen (TYLENOL) 325 MG tablet Take 650 mg by mouth every 6 (six) hours as needed for mild pain or headache.   Yes [provider]  amLODipine (NORVASC) 10 MG tablet Take 1 tablet (10 mg total) by mouth daily. 10/23/22  Yes [name], FNP  aspirin 81 MG chewable tablet Chew 1 tablet (81 mg total) by mouth daily. 10/23/22  Yes [name], FNP  atorvastatin (LIPITOR) 40 MG tablet Take 1 tablet (40 mg total) by mouth daily. 10/23/22  Yes [name], FNP  FARXIGA 10 MG TABS tablet Take 1 tablet (10 mg total) by mouth daily. 12/04/22  Yes  [name], FNP  gabapentin (NEURONTIN) 100 MG capsule Take 2 capsules (200 mg total) by mouth 2 (two) times daily. 09/21/22  Yes [name], FNP  spironolactone (ALDACTONE) 25 MG tablet Take 1 tablet (25 mg total) by mouth daily. 10/23/22  Yes [name], FNP  telmisartan (MICARDIS) 80 MG tablet Take 1 tablet (80 mg total) by mouth daily. 10/23/22  Yes [name], FNP  hydrocortisone (ANUSOL-HC) 25 MG suppository Place 1 suppository (25 mg total) rectally every 12 (twelve) hours. Patient not taking: Reported on 02/20/2023 02/19/23 02/19/24  [name], FNP  ondansetron (ZOFRAN) 4 MG tablet Take 1 tablet (4 mg total) by mouth daily as  needed for nausea or vomiting. Patient not taking: Reported on 02/20/2023 02/19/23 02/19/24  [name], FNP    Physical Exam: Vitals:   02/20/23 1047 02/20/23 1200 02/20/23 1324 02/20/23 1345  BP: (!) 190/79 (!) 181/85 (!) 182/107 (!) 168/82  Pulse: 75 80 72 71  Resp: 18 18 18 18   Temp: 98.7 F (37.1 C)  99 F (37.2 C) 98.9 F (37.2 C)  TempSrc:   Oral Oral  SpO2: 100% 100% 100%   Weight: 91.5 kg     Height: 5\' 8"  (1.727 m)       Constitutional: NAD, calm, comfortable Vitals:   02/20/23 1047 02/20/23 1200 02/20/23 1324 02/20/23 1345  BP: (!) 190/79 (!) 181/85 (!) 182/107 (!) 168/82  Pulse: 75 80 72 71  Resp: 18 18 18 18   Temp: 98.7 F (37.1 C)  99 F (37.2 C) 98.9 F (37.2 C)  TempSrc:   Oral Oral  SpO2: 100% 100% 100%   Weight: 91.5 kg     Height: 5\' 8"  (1.727 m)      Eyes: PERRL, lids and conjunctivae normal ENMT: Mucous membranes are moist. Posterior pharynx clear of any exudate or lesions.Normal dentition.  Neck: normal, supple, no masses, no thyromegaly Respiratory: clear to auscultation bilaterally, no wheezing, no crackles. Normal respiratory effort. No accessory muscle use.  Cardiovascular: Regular rate and rhythm, no murmurs / rubs / gallops. No extremity edema. 2+ pedal pulses. No carotid bruits.  Abdomen: no tenderness, no masses palpated. No hepatosplenomegaly. Bowel sounds positive.  Musculoskeletal: no clubbing / cyanosis. No joint deformity upper and lower extremities. Good ROM, no contractures. Normal muscle tone.  Skin: no rashes, lesions, ulcers. No induration Neurologic: CN 2-12 grossly intact. Sensation intact, DTR normal. Strength 5/5 in all 4.  Psychiatric: Normal judgment and insight. Alert and oriented x 3. Normal mood.     Labs on Admission: I have personally reviewed following labs and imaging studies  CBC: Recent Labs  Lab 02/20/23 1104 02/20/23 1109  WBC 8.1  --   HGB 6.0* 7.5*  HCT 23.0* 22.0*  MCV 77.7*  --   PLT 421*  --     Basic Metabolic Panel: Recent Labs  Lab 02/20/23 1104 02/20/23 1109  NA 139 143  K 4.5 4.5  CL 110 113*  CO2 18*  --   GLUCOSE 93 93  BUN 29* 30*  CREATININE 2.66* 2.90*  CALCIUM 8.7*  --    GFR: Estimated Creatinine Clearance: 23.8 mL/min (A) (by C-G formula based on SCr of 2.9 mg/dL (H)). Liver Function Tests: Recent Labs  Lab 02/20/23 1104  AST 16  ALT 11  ALKPHOS 55  BILITOT 0.6  PROT 7.7  ALBUMIN 3.3*   No results for input(s): "LIPASE", "AMYLASE" in the last 168 hours. No results for input(s): "AMMONIA" in the last 168  hours. Coagulation Profile: No results for input(s): "INR", "PROTIME" in the last 168 hours. Cardiac Enzymes: No results for input(s): "CKTOTAL", "CKMB", "CKMBINDEX", "TROPONINI" in the last 168 hours. BNP (last 3 results) No results for input(s): "PROBNP" in the last 8760 hours. HbA1C: No results for input(s): "HGBA1C" in the last 72 hours. CBG: No results for input(s): "GLUCAP" in the last 168 hours. Lipid Profile: No results for input(s): "CHOL", "HDL", "LDLCALC", "TRIG", "CHOLHDL", "LDLDIRECT" in the last 72 hours. Thyroid Function Tests: No results for input(s): "TSH", "T4TOTAL", "FREET4", "T3FREE", "THYROIDAB" in the last 72 hours. Anemia Panel: No results for input(s): "VITAMINB12", "FOLATE", "FERRITIN", "TIBC", "IRON", "RETICCTPCT" in the last 72 hours. Urine analysis:    Component Value Date/Time   COLORURINE YELLOW 09/07/2018 1412   APPEARANCEUR HAZY (A) 09/07/2018 1412   LABSPEC 1.014 09/07/2018 1412   PHURINE 6.0 09/07/2018 1412   GLUCOSEU NEGATIVE 09/07/2018 1412   HGBUR NEGATIVE 09/07/2018 1412   BILIRUBINUR negative 11/16/2020 1725   KETONESUR NEGATIVE 09/07/2018 1412   PROTEINUR Positive (A) 11/16/2020 1725   PROTEINUR NEGATIVE 09/07/2018 1412   UROBILINOGEN 0.2 11/16/2020 1725   UROBILINOGEN 0.2 05/12/2013 1025   NITRITE negative 11/16/2020 1725   NITRITE NEGATIVE 09/07/2018 1412   LEUKOCYTESUR Trace (A)  11/16/2020 1725    Radiological Exams on Admission: No results found.  EKG: Independently reviewed.  Sinus rhythm, no acute ST changes, LVH.  Assessment/Plan Principal Problem:   Anemia Active Problems:   AKI (acute kidney injury) (HCC)   CKD (chronic kidney disease) stage 3, GFR 30-59 ml/min (HCC)  (please populate well all problems here in Problem List. (For example, if patient is on BP meds at home and you resume or decide to hold them, it is a problem that needs to be her. Same for CAD, COPD, HLD and so on)  Symptomatic anemia, microcytic, acute on chronic -Check iron study and reticulocyte count -PRBC x 2 ordered in the ED, H&H tonight and tomorrow morning -Depends on the iron study, will consider consult GI -Guaiac test negative in the ED, will check FOBT  AKI on CKD stage IIIb, with non-anion gap metabolic acidosis -Euvolemic -Check renal ultrasound and UA -Hold off ARB and spironolactone -Repeat BMP tomorrow  HTN, uncontrolled -Continue amlodipine, hold off HCTZ, Lasix, Aldactone, for possible AKI -As needed hydralazine  IIDM -Controlled.  Hold off Farxiga -Start Januvia  DVT prophylaxis: SCD Code Status: Full code Family Communication: Sister at bedside Disposition Plan: Expect less than 2 midnight hospital stay Consults called: None Admission status: MedSurg observation   Emeline General MD [facility] Pager [phone]  02/20/2023, 2:00 PM

## 2023-02-20 NOTE — ED Triage Notes (Signed)
Pt dent by PCP to get a blood transfusion, because her hemoglobin is 6.0. Pt c/o hemorrhoidal pain. Pt denies SOB or weakness.

## 2023-02-20 NOTE — ED Provider Notes (Addendum)
EMERGENCY DEPARTMENT AT [facility] Provider Note   CSN: [id number] Arrival date & time: 02/20/23  1042     History  Chief Complaint  Patient presents with   Abnormal Lab    [name] is a 62 y.o. female.  Patient sent here for lab check.  Supposed that hemoglobin of 6 yesterday when at primary care doctor's office for routine checkup and had labs drawn.  She has a history of chronic kidney disease.  She been dealing with some hemorrhoid pain here recently but denies any black or bloody stools or blood clots.  She denies any vaginal bleeding.  She denies any chest pain or shortness of breath.  She follows with nephrology.  She does notice if she goes for somewhat longer walks or longer movements she does get little bit tired may be some discomfort in her legs.  The history is provided by the patient.       Home Medications Prior to Admission medications   Medication Sig Start Date End Date Taking? Authorizing Provider  acetaminophen (TYLENOL) 325 MG tablet Take 650 mg by mouth every 6 (six) hours as needed for mild pain or headache.    [provider]  amLODipine (NORVASC) 10 MG tablet Take 1 tablet (10 mg total) by mouth daily. 10/23/22   [name], FNP  aspirin 81 MG chewable tablet Chew 1 tablet (81 mg total) by mouth daily. 10/23/22   [name], FNP  atorvastatin (LIPITOR) 40 MG tablet Take 1 tablet (40 mg total) by mouth daily. 10/23/22   [name], FNP  FARXIGA 10 MG TABS tablet Take 1 tablet (10 mg total) by mouth daily. 12/04/22   [name], FNP  gabapentin (NEURONTIN) 100 MG capsule Take 2 capsules (200 mg total) by mouth 2 (two) times daily. 09/21/22   [name], FNP  hydrocortisone (ANUSOL-HC) 25 MG suppository Place 1 suppository (25 mg total) rectally every 12 (twelve) hours. 02/19/23 02/19/24  [name], FNP  ondansetron (ZOFRAN) 4 MG tablet Take 1 tablet (4 mg total) by mouth daily as needed for nausea or vomiting. 02/19/23  02/19/24  [name], FNP  spironolactone (ALDACTONE) 25 MG tablet Take 1 tablet (25 mg total) by mouth daily. 10/23/22   [name], FNP  telmisartan (MICARDIS) 80 MG tablet Take 1 tablet (80 mg total) by mouth daily. 10/23/22   [name], FNP      Allergies    Lisinopril    Review of Systems   Review of Systems  Physical Exam Updated Vital Signs BP (!) 181/85   Pulse 80   Temp 98.7 F (37.1 C)   Resp 18   Ht 5\' 8"  (1.727 m)   Wt 91.5 kg   SpO2 100%   BMI 30.67 kg/m  Physical Exam Vitals and nursing note reviewed.  Constitutional:      General: She is not in acute distress.    Appearance: She is well-developed. She is not ill-appearing.  HENT:     Head: Normocephalic and atraumatic.     Nose: Nose normal.     Mouth/Throat:     Mouth: Mucous membranes are moist.  Eyes:     Extraocular Movements: Extraocular movements intact.     Conjunctiva/sclera: Conjunctivae normal.     Pupils: Pupils are equal, round, and reactive to light.  Cardiovascular:     Rate and Rhythm: Normal rate and regular rhythm.     Pulses: Normal pulses.     Heart sounds: Normal  heart sounds. No murmur heard. Pulmonary:     Effort: Pulmonary effort is normal. No respiratory distress.     Breath sounds: Normal breath sounds.  Abdominal:     Palpations: Abdomen is soft.     Tenderness: There is no abdominal tenderness.  Genitourinary:    Rectum: Guaiac result negative.     Comments: Hemorrhoids on rectal exam but stool is grossly brown Musculoskeletal:        General: No swelling.     Cervical back: Neck supple.  Skin:    General: Skin is warm and dry.     Capillary Refill: Capillary refill takes less than 2 seconds.  Neurological:     General: No focal deficit present.     Mental Status: She is alert.  Psychiatric:        Mood and Affect: Mood normal.     ED Results / Procedures / Treatments   Labs (all labs ordered are listed, but only abnormal results are displayed) Labs  Reviewed  CBC - Abnormal; Notable for the following components:      Result Value   RBC 2.96 (*)    Hemoglobin 6.0 (*)    HCT 23.0 (*)    MCV 77.7 (*)    MCH 20.3 (*)    MCHC 26.1 (*)    RDW 18.4 (*)    Platelets 421 (*)    All other components within normal limits  I-STAT CHEM 8, ED - Abnormal; Notable for the following components:   Chloride 113 (*)    BUN 30 (*)    Creatinine, Ser 2.90 (*)    Calcium, Ion 1.11 (*)    TCO2 18 (*)    Hemoglobin 7.5 (*)    HCT 22.0 (*)    All other components within normal limits  COMPREHENSIVE METABOLIC PANEL  POC OCCULT BLOOD, ED  TYPE AND SCREEN  ABO/RH  PREPARE RBC (CROSSMATCH)    EKG None  Radiology No results found.  Procedures .Critical Care  Performed by: Virgina Norfolk, DO Authorized by: Virgina Norfolk, DO   Critical care provider statement:    Critical care time (minutes):  35   Critical care was necessary to treat or prevent imminent or life-threatening deterioration of the following conditions: hb 6.0, requiring transfusion.   Critical care was time spent personally by me on the following activities:  Blood draw for specimens, development of treatment plan with patient or surrogate, discussions with primary provider, evaluation of patient's response to treatment, examination of patient, obtaining history from patient or surrogate, ordering and performing treatments and interventions, ordering and review of radiographic studies, ordering and review of laboratory studies, pulse oximetry, re-evaluation of patient's condition and review of old charts   I assumed direction of critical care for this patient from another provider in my specialty: no       Medications Ordered in ED Medications  0.9 %  sodium chloride infusion (Manually program via Guardrails IV Fluids) (has no administration in time range)  hydrocortisone (ANUSOL-HC) suppository 25 mg (25 mg Rectal Given 02/20/23 1159)    ED Course/ Medical Decision Making/  A&P                             Medical Decision Making Amount and/or Complexity of Data Reviewed Labs: ordered.  Risk Prescription drug management. Decision regarding hospitalization.   [name] is here for lab recheck.  Supposedly she had a hemoglobin of 6  that was drawn on outpatient visit yesterday by primary care doctor.  She has a history of CKD.  She has a history of high cholesterol.  She is never needed blood transfusion before.  She denies any black or bloody stools.  Not passing any blood clots.  She is dealing with some hemorrhoids now.  Rectal exam shows hemorrhoid but there is no obvious blood in the stool.  Stool looks grossly brown.  My suspicion is that this is likely from chronic kidney disease may be some sort of other chronic anemia.  She is not overly symptomatic but does seem that when she is exerting herself she is feeling symptoms.  Will recheck lab work today.  Looks like an iron panel was already drawn yesterday.  I cannot see the results of any of her labs from yesterday.  Patient per my review interpretation labs with a hemoglobin of 6.  Hemoccult was negative.  I do suspect that this is likely anemia of chronic disease probably from chronic kidney disease may be iron deficiency as well.  Will write her for 2 units of packed red blood cells and have her admitted for further anemia workup.  This chart was dictated using voice recognition software.  Despite best efforts to proofread,  errors can occur which can change the documentation meaning.         Final Clinical Impression(s) / ED Diagnoses Final diagnoses:  Symptomatic anemia    Rx / DC Orders ED Discharge Orders     None         Virgina Norfolk, DO 02/20/23 1225    Virgina Norfolk, DO 02/20/23 1226

## 2023-02-20 NOTE — Telephone Encounter (Signed)
Patient called and notified she needs to go to ER due to her hgb being 6 patient notified and stated she was going to call her sister and go now.

## 2023-02-20 NOTE — Progress Notes (Signed)
1556 - Blood administration started in ED complete. Vitals obtained.  No adverse events.

## 2023-02-21 ENCOUNTER — Observation Stay (HOSPITAL_COMMUNITY): Payer: 59

## 2023-02-21 DIAGNOSIS — K5909 Other constipation: Secondary | ICD-10-CM

## 2023-02-21 DIAGNOSIS — D649 Anemia, unspecified: Secondary | ICD-10-CM | POA: Diagnosis present

## 2023-02-21 DIAGNOSIS — N179 Acute kidney failure, unspecified: Secondary | ICD-10-CM | POA: Diagnosis not present

## 2023-02-21 DIAGNOSIS — K625 Hemorrhage of anus and rectum: Secondary | ICD-10-CM | POA: Diagnosis not present

## 2023-02-21 DIAGNOSIS — D638 Anemia in other chronic diseases classified elsewhere: Secondary | ICD-10-CM | POA: Diagnosis present

## 2023-02-21 DIAGNOSIS — Z720 Tobacco use: Secondary | ICD-10-CM | POA: Diagnosis not present

## 2023-02-21 DIAGNOSIS — E269 Hyperaldosteronism, unspecified: Secondary | ICD-10-CM

## 2023-02-21 DIAGNOSIS — N183 Chronic kidney disease, stage 3 unspecified: Secondary | ICD-10-CM | POA: Diagnosis not present

## 2023-02-21 DIAGNOSIS — I5032 Chronic diastolic (congestive) heart failure: Secondary | ICD-10-CM

## 2023-02-21 DIAGNOSIS — E7800 Pure hypercholesterolemia, unspecified: Secondary | ICD-10-CM

## 2023-02-21 DIAGNOSIS — R935 Abnormal findings on diagnostic imaging of other abdominal regions, including retroperitoneum: Secondary | ICD-10-CM | POA: Diagnosis not present

## 2023-02-21 DIAGNOSIS — N1832 Chronic kidney disease, stage 3b: Secondary | ICD-10-CM

## 2023-02-21 DIAGNOSIS — D509 Iron deficiency anemia, unspecified: Secondary | ICD-10-CM

## 2023-02-21 DIAGNOSIS — R932 Abnormal findings on diagnostic imaging of liver and biliary tract: Secondary | ICD-10-CM | POA: Diagnosis not present

## 2023-02-21 DIAGNOSIS — K7689 Other specified diseases of liver: Secondary | ICD-10-CM | POA: Diagnosis not present

## 2023-02-21 LAB — CBC
HCT: 23.8 % — ABNORMAL LOW (ref 36.0–46.0)
Hematocrit: 22 % — ABNORMAL LOW (ref 34.0–46.6)
Hemoglobin: 6.8 g/dL — CL (ref 12.0–15.0)
Hemoglobin: 6 g/dL — CL (ref 11.1–15.9)
MCH: 22.5 pg — ABNORMAL LOW (ref 26.0–34.0)
MCH: 20.8 pg — ABNORMAL LOW (ref 26.6–33.0)
MCHC: 28.6 g/dL — ABNORMAL LOW (ref 30.0–36.0)
MCHC: 27.3 g/dL — ABNORMAL LOW (ref 31.5–35.7)
MCV: 78.8 fL — ABNORMAL LOW (ref 80.0–100.0)
MCV: 76 fL — ABNORMAL LOW (ref 79–97)
Platelets: 367 10*3/uL (ref 150–400)
Platelets: 431 10*3/uL (ref 150–450)
RBC: 3.02 MIL/uL — ABNORMAL LOW (ref 3.87–5.11)
RBC: 2.89 x10E6/uL — ABNORMAL LOW (ref 3.77–5.28)
RDW: 17.6 % — ABNORMAL HIGH (ref 11.5–15.5)
RDW: 16.8 % — ABNORMAL HIGH (ref 11.7–15.4)
WBC: 7.5 10*3/uL (ref 4.0–10.5)
WBC: 7.7 10*3/uL (ref 3.4–10.8)
nRBC: 0.3 % — ABNORMAL HIGH (ref 0.0–0.2)

## 2023-02-21 LAB — CMP14+EGFR
ALT: 7 IU/L (ref 0–32)
AST: 11 IU/L (ref 0–40)
Albumin: 3.9 g/dL (ref 3.9–4.9)
Alkaline Phosphatase: 75 IU/L (ref 44–121)
BUN/Creatinine Ratio: 12 (ref 12–28)
BUN: 33 mg/dL — ABNORMAL HIGH (ref 8–27)
Bilirubin Total: 0.2 mg/dL (ref 0.0–1.2)
CO2: 19 mmol/L — ABNORMAL LOW (ref 20–29)
Calcium: 9.3 mg/dL (ref 8.7–10.3)
Chloride: 109 mmol/L — ABNORMAL HIGH (ref 96–106)
Creatinine, Ser: 2.82 mg/dL — ABNORMAL HIGH (ref 0.57–1.00)
Globulin, Total: 3.6 g/dL (ref 1.5–4.5)
Glucose: 88 mg/dL (ref 70–99)
Potassium: 4.4 mmol/L (ref 3.5–5.2)
Sodium: 142 mmol/L (ref 134–144)
Total Protein: 7.5 g/dL (ref 6.0–8.5)
eGFR: 18 mL/min/{1.73_m2} — ABNORMAL LOW (ref >59)

## 2023-02-21 LAB — BASIC METABOLIC PANEL
Anion gap: 8 (ref 5–15)
BUN: 29 mg/dL — ABNORMAL HIGH (ref 8–23)
CO2: 20 mmol/L — ABNORMAL LOW (ref 22–32)
Calcium: 8.3 mg/dL — ABNORMAL LOW (ref 8.9–10.3)
Chloride: 112 mmol/L — ABNORMAL HIGH (ref 98–111)
Creatinine, Ser: 2.36 mg/dL — ABNORMAL HIGH (ref 0.44–1.00)
GFR, Estimated: 23 mL/min — ABNORMAL LOW (ref >60)
Glucose, Bld: 88 mg/dL (ref 70–99)
Potassium: 4.2 mmol/L (ref 3.5–5.1)
Sodium: 140 mmol/L (ref 135–145)

## 2023-02-21 LAB — BRAIN NATRIURETIC PEPTIDE: BNP: 144.5 pg/mL — ABNORMAL HIGH (ref 0.0–100.0)

## 2023-02-21 LAB — BPAM RBC
ISSUE DATE / TIME: 202407021610
Unit Type and Rh: 7300
Unit Type and Rh: 7300

## 2023-02-21 LAB — IRON AND TIBC
Iron: 141 ug/dL (ref 28–170)
Saturation Ratios: 36 % — ABNORMAL HIGH (ref 10.4–31.8)
TIBC: 396 ug/dL (ref 250–450)
UIBC: 255 ug/dL

## 2023-02-21 LAB — TYPE AND SCREEN

## 2023-02-21 LAB — IRON,TIBC AND FERRITIN PANEL
Ferritin: 14 ng/mL — ABNORMAL LOW (ref 15–150)
Iron Saturation: 3 % — CL (ref 15–55)
Iron: 12 ug/dL — ABNORMAL LOW (ref 27–139)
Total Iron Binding Capacity: 422 ug/dL (ref 250–450)
UIBC: 410 ug/dL — ABNORMAL HIGH (ref 118–369)

## 2023-02-21 LAB — FOLATE: Folate: 19 ng/mL (ref >5.9)

## 2023-02-21 LAB — FERRITIN: Ferritin: 18 ng/mL (ref 11–307)

## 2023-02-21 LAB — VITAMIN B12: Vitamin B-12: 415 pg/mL (ref 180–914)

## 2023-02-21 LAB — PREPARE RBC (CROSSMATCH)

## 2023-02-21 LAB — HIV ANTIBODY (ROUTINE TESTING W REFLEX): HIV Screen 4th Generation wRfx: NONREACTIVE

## 2023-02-21 LAB — HEMOGLOBIN AND HEMATOCRIT, BLOOD
HCT: 27.4 % — ABNORMAL LOW (ref 36.0–46.0)
Hemoglobin: 8 g/dL — ABNORMAL LOW (ref 12.0–15.0)

## 2023-02-21 LAB — ZINC: Zinc: 67 ug/dL (ref 44–115)

## 2023-02-21 LAB — LACTATE DEHYDROGENASE: LDH: 114 U/L (ref 98–192)

## 2023-02-21 MED ORDER — SODIUM CHLORIDE 0.9% IV SOLUTION: Freq: Once | INTRAVENOUS | Status: DC

## 2023-02-21 MED ORDER — SODIUM BICARBONATE 650 MG PO TABS
650.0000 mg | ORAL_TABLET | Freq: Two times a day (BID) | ORAL | Status: DC
  Administered 2023-02-21 – 2023-02-23 (×5): 650 mg via ORAL
  Filled 2023-02-21 (×6): qty 1

## 2023-02-21 MED ORDER — LABETALOL HCL 5 MG/ML IV SOLN
10.0000 mg | INTRAVENOUS | Status: DC | PRN
  Administered 2023-02-22 – 2023-02-23 (×4): 10 mg via INTRAVENOUS
  Filled 2023-02-21 (×4): qty 4

## 2023-02-21 MED ORDER — CARVEDILOL 6.25 MG PO TABS
6.2500 mg | ORAL_TABLET | Freq: Two times a day (BID) | ORAL | Status: DC
  Administered 2023-02-21 – 2023-02-23 (×4): 6.25 mg via ORAL
  Filled 2023-02-21 (×4): qty 1

## 2023-02-21 MED ORDER — PANTOPRAZOLE SODIUM 40 MG PO TBEC
40.0000 mg | DELAYED_RELEASE_TABLET | Freq: Every day | ORAL | Status: DC
  Administered 2023-02-21 – 2023-02-23 (×3): 40 mg via ORAL
  Filled 2023-02-21 (×3): qty 1

## 2023-02-21 MED ORDER — SODIUM CHLORIDE 0.9 % IV SOLN
250.0000 mg | Freq: Once | INTRAVENOUS | Status: AC
  Administered 2023-02-21: 250 mg via INTRAVENOUS
  Filled 2023-02-21 (×2): qty 20

## 2023-02-21 NOTE — Progress Notes (Signed)
Critical Hgb 6.8. Notified Dr. Antionette Char. 1 Unit RBC ordered. Transfusion started @0233  and was completed @0450 . No S&S of reaction. No dizziness, lightheadedness or weakness  reported. BP increased to 188/92 after transfusion completed. Scheduled hydralazine given. PRN tylenol given for "slight headache." Will recheck bp later. Dr. Antionette Char notified per above. No new orders at this time.

## 2023-02-21 NOTE — Progress Notes (Signed)
[facility]                                                                              [name], is a 62 y.o. female, DOB - July 29, 1961, ZOX:[id number] Admit date - 02/20/2023    Outpatient Primary MD for the patient is [name], FNP  LOS - 0  days  Chief Complaint  Patient presents with   Abnormal Lab       Brief summary   Patient is a 62 year old female with HTN, chronic HFpEF, HLD, GERD, chronic kidney disease stage IIIb was sent from PCPs office for anemia.  Patient reported generalized weakness, malaise 3 days ago, exertional dyspnea no cough, no chest pain.  Denied any abdominal pain, rectal bleeding or dark-colored stools.  She does have chronic hemorrhoids, occasionally has bleeding when wiping.  No weight loss, no change of bowel habits.  Patient went to see her PCP and was found to have low hemoglobin and presented to ED. In ED, BP elevated 170/80, hemoglobin 6.0, baseline hemoglobin 10.  MCV 77, RDW 18, creatinine 2.9, baseline 1.9, CO2 18    Assessment & Plan    Principal Problem:   Symptomatic anemia, acute on chronic, microcytic -Unclear etiology, has history of hemorrhoids (occasionally has rectal bleeding), GERD, has been taking NSAIDs occasionally but no recent melena or hematochezia.  FOBT negative. -Anemia panel showed iron 141, TIBC 396, ferritin 18, folate 19.0, B12 415 -LDH 114, haptoglobin pending -Presented with hemoglobin of 6.0, received 3 units packed RBCs overnight, hemoglobin 8.0 this morning -No recent weight loss, change of bowel habits, will consult with GI.   Active Problems: Acute on CKD (chronic kidney disease) stage 3b, GFR 30-59 ml/min (HCC), metabolic acidosis -Likely worsened due to severe anemia, baseline creatinine 1.9, presented with creatinine of 2.9 -Status post 3 units packed RBCs, creatinine improving to 2.3 -Renal ultrasound showed no hydronephrosis or acute findings, medical renal disease -Hold  spironolactone, telmisartan -Will place on sodium bicarb, will benefit from Iv iron or Epo inpatient.  Follows [employer] [facility], Dr. Thedore Mins  Accelerated hypertension, underlying history of hyperaldosteronism -Holding spironolactone, telmisartan due to AKI on CKD 3B - continue amlodipine 10 mg daily, Imdur - Coreg increased to 6.25 mg twice daily, continue hydralazine 25 mg 3 times daily -Labetalol IV as needed with parameters    Hyperlipemia Continue Lipitor    GERD (gastroesophageal reflux disease) -Continue Protonix 40 mg daily  Hemorrhoids -Continue Anusol suppository  Obesity Estimated body mass index is 30.67 kg/m as calculated from the following:   Height as of this encounter: 5\' 8"  (1.727 m).   Weight as of this encounter: 91.5 kg.  Code Status: Full code DVT Prophylaxis:  SCDs Start: 02/20/23 1331   Level of Care: Level of care: Med-Surg Family Communication: Updated patient Disposition Plan:      Remains inpatient appropriate:      Procedures:    Consultants:   GI  Antimicrobials:     Medications  sodium chloride   Intravenous Once   sodium chloride   Intravenous Once   amLODipine  10 mg Oral Daily   atorvastatin  40  mg Oral Daily   carvedilol  3.125 mg Oral BID WC   gabapentin  200 mg Oral BID   hydrALAZINE  25 mg Oral Q8H   hydrocortisone  25 mg Rectal BID   isosorbide mononitrate  30 mg Oral Daily   linagliptin  5 mg Oral Daily   pantoprazole  40 mg Oral Q0600      Subjective:   [name] was seen and examined today.  BP elevated, overnight no bleeding.  No nausea vomiting, abdominal pain, chest pain.  Received 3 units packed RBCs.  Weakness improving.  Objective:   Vitals:   02/21/23 0450 02/21/23 0641 02/21/23 0729 02/21/23 0941  BP: (!) 188/92 (!) 185/87 (!) 187/87 (!) 166/79  Pulse: 74 78 83 72  Resp: 18 18 18    Temp: 98.7 F (37.1 C)  99.1 F (37.3 C)   TempSrc: Oral  Oral   SpO2: 100% 100% 99% 100%  Weight:       Height:        Intake/Output Summary (Last 24 hours) at 02/21/2023 1109 Last data filed at 02/21/2023 0800 Gross per 24 hour  Intake 1238.67 ml  Output --  Net 1238.67 ml     Wt Readings from Last 3 Encounters:  02/20/23 91.5 kg  02/19/23 91.5 kg  10/04/22 95.3 kg     Exam General: Alert and oriented x 3, NAD Cardiovascular: S1 S2 auscultated,  RRR Respiratory: Clear to auscultation bilaterally Gastrointestinal: Soft, nontender, nondistended, + bowel sounds Ext: no pedal edema bilaterally Neuro: no new deficits Psych: Normal affect     Data Reviewed:  I have personally reviewed following labs    CBC Lab Results  Component Value Date   WBC 7.5 02/21/2023   RBC 3.02 (L) 02/21/2023   HGB 8.0 (L) 02/21/2023   HCT 27.4 (L) 02/21/2023   MCV 78.8 (L) 02/21/2023   MCH 22.5 (L) 02/21/2023   PLT 367 02/21/2023   MCHC 28.6 (L) 02/21/2023   RDW 17.6 (H) 02/21/2023   LYMPHSABS 1.1 04/08/2022   MONOABS 0.6 04/08/2022   EOSABS 0.0 04/08/2022   BASOSABS 0.0 04/08/2022     Last metabolic panel Lab Results  Component Value Date   NA 140 02/21/2023   K 4.2 02/21/2023   CL 112 (H) 02/21/2023   CO2 20 (L) 02/21/2023   BUN 29 (H) 02/21/2023   CREATININE 2.36 (H) 02/21/2023   GLUCOSE 88 02/21/2023   GFRNONAA 23 (L) 02/21/2023   GFRAA 48 (L) 10/11/2020   CALCIUM 8.3 (L) 02/21/2023   PHOS 4.0 11/16/2020   PROT 7.7 02/20/2023   ALBUMIN 3.3 (L) 02/20/2023   LABGLOB 3.2 08/23/2021   AGRATIO 1.3 08/23/2021   BILITOT 0.6 02/20/2023   ALKPHOS 55 02/20/2023   AST 16 02/20/2023   ALT 11 02/20/2023   ANIONGAP 8 02/21/2023    CBG (last 3)  No results for input(s): "GLUCAP" in the last 72 hours.    Coagulation Profile: No results for input(s): "INR", "PROTIME" in the last 168 hours.   Radiology Studies: I have personally reviewed the imaging studies  DG Abd 1 View  Result Date: 02/20/2023 CLINICAL DATA:  abdomen pain, abdomen distended. EXAM: ABDOMEN - 1 VIEW  COMPARISON:  09/05/2018 FINDINGS: Stomach and small bowel decompressed. There is mild gaseous distention of the descending colon, decompressed distally. Stable left pelvic phleboliths. Regional bones unremarkable. IMPRESSION: Mild gaseous distention of the descending colon. Electronically Signed   By: Corlis Leak M.D.   On: 02/20/2023 15:25  US RENAL  Result Date: 02/20/2023 CLINICAL DATA:  [id number] AKI (acute kidney injury) (HCC) [id number] EXAM: RENAL / URINARY TRACT ULTRASOUND COMPLETE COMPARISON:  04/19/2021 FINDINGS: Right Kidney: Renal measurements: 11.6 x 4.9 x 5.6 cm = volume: 165 mL. Parenchyma is slightly hyperechoic compared to adjacent liver. 1.1 cm cyst in the upper pole. No hydronephrosis. Left Kidney: Renal measurements: 10.7 x 5.7 x 5 cm = volume: 159 mL. Echogenicity within normal limits. No mass or hydronephrosis visualized. Bladder: Appears normal for degree of bladder distention. Ureteral jets not demonstrated. Other: Technologist describes technically difficult study secondary to bowel gas and body habitus. IMPRESSION: No hydronephrosis or other acute finding. Echogenic renal parenchyma, a nonspecific finding seen in medical renal disease. Electronically Signed   By: Corlis Leak M.D.   On: 02/20/2023 15:23       Jamicheal Heard M.D. [facility] 02/21/2023, 11:09 AM  Available via Epic secure chat 7am-7pm After 7 pm, please refer to night coverage provider listed on amion.

## 2023-02-21 NOTE — Progress Notes (Signed)
This RN electronically requested 1800 dose of FERRLECIT from pharmacy.  Awaiting medication for administration.  Pt aware and educated on new medication.

## 2023-02-21 NOTE — Progress Notes (Signed)
This RN sent 2nd request to pharmacy for Ferrlecit IVP 1800 dose electronically.  Awaiting response.  Pt HGB 6.0.  This RN notified Rai, Ripudeep, MD of result. Awaiting any new orders.

## 2023-02-21 NOTE — Progress Notes (Signed)
Hgb is 6.8. Plan to transfuse 1 unit RBC.

## 2023-02-21 NOTE — Progress Notes (Signed)
Pt BP remained elevated (185/87) despite taking scheduled hydralazine. PRN BP med is currently dc'd. DR. Antionette Char notified. This nurse asked to have dayteam review BP meds and PRN meds to control HTN. No new orders at this time. Will pass to dayshift nurse.

## 2023-02-21 NOTE — Consult Note (Signed)
Referring Provider: Dr. Thad Ranger Primary Care Physician:  [name], FNP Primary Gastroenterologist:  Gentry Fitz   Reason for Consultation: Microcytic anemia   HPI: [name] is a 62 y.o. female with a past medical history of hypertension, chronic diastolic CHF, hyperlipidemia, adrenal adenoma, CKD stage IIIA, anemia and GERD.  She was seen by her PCP 02/20/2023 due to having weakness and legs for the past 2 weeks and laboratory studies were done at that time which showed a hemoglobin level of 6.0. She was sent to the ED for further evaluation.  Labs in the ED showed a hemoglobin level of 6 with a baseline hemoglobin level of 10.0 on 04/08/2022.  Hematocrit 23.  MCV 77.7.  Platelet 421.  Reticulocyte count 2.6.  BUN 29.  Creatinine 2.66.  Total bili 0.6.  Alk phos 55.  AST 16.  ALT 11.    Transfused 2 units of PRBCs -> Hg 7.5.   Labs today: Hemoglobin 6.8 -> transfused 1 unit of PRBCs -> Hg 8.0. Iron 141.  Saturation ratio is 36.  TIBC 396.  Ferritin 18.  Folate 19.  B12 level 415.  Haptoglobin pending.  HIV nonreactive.  A GI consult was requested for further evaluation regarding profound anemia with a hemoglobin level of 6.  Ferritin 18.  FOBT negative.    She denies having any heartburn or dysphagia.  Never had an EGD.  She had nausea and vomited clear emesis x 1 episode 1 week ago without recurrence.  No upper or lower abdominal pain.  She has chronic constipation and passes a hard brown stool every 2 to 3 days.  She sometimes sees bright red blood on her stool after straining which she attributes to having hemorrhoids.  No black stools.  She denies ever having a screening colonoscopy.  A Cologuard test was ordered by her PCP 09/2022 but she did not complete this test.  No known family history of esophageal, gastric, colon, pancreatic or liver cancer.  No known family history of anemia.  She endorses eating [place] cornstarch 3 to 4 teaspoons daily for the past year.  She drinks  two 40  ounce beers twice weekly or less, no beer consumption for the past 2 weeks. No drug use. She smokes cigarettes 1/ ppd x 20+ years.  No fevers, night sweats or weight loss.  No chest pain or shortness of breath.  She takes ASA 81 mg daily which was discontinued by her PCP on 02/19/2023.  No other NSAID use.  ECHO 09/06/2018:  Normal LV size with severe LV hypertrophy. EF 60-65%. Moderate   LAE. Normal RV size and systolic function. No significant valvular abnormalities. Cardiac amyloidosis is a consideration.   Past Medical History:  Diagnosis Date   Adrenal adenoma 01/11/2021   Anemia    CHF (congestive heart failure) (HCC)    Chronic bronchitis (HCC)    "probably once/yr" (05/12/2013)   Diastolic heart failure    Dyslipidemia    pt denies this hx on 05/12/2013   Exertional shortness of breath    GERD (gastroesophageal reflux disease)    Hypertension    Hypokalemia    Left ventricular hypertrophy 01/11/2021   Venous stasis ulcers (HCC)     Past Surgical History:  Procedure Laterality Date   IR US GUIDE VASC ACCESS RIGHT  02/27/2017   IR VENOGRAM ADRENAL BI  02/27/2017   IR VENOGRAM HEPATIC WO HEMODYNAMIC EVALUATION  02/27/2017   IR VENOGRAM RENAL BI  02/27/2017   IR VENOUS  SAMPLING  02/27/2017   IR VENOUS SAMPLING  02/27/2017   VAGINAL HYSTERECTOMY  1990's    Prior to Admission medications   Medication Sig Start Date End Date Taking? Authorizing Provider  acetaminophen (TYLENOL) 325 MG tablet Take 650 mg by mouth every 6 (six) hours as needed for mild pain or headache.   Yes [provider]  amLODipine (NORVASC) 10 MG tablet Take 1 tablet (10 mg total) by mouth daily. 10/23/22  Yes [name], FNP  aspirin 81 MG chewable tablet Chew 1 tablet (81 mg total) by mouth daily. 10/23/22  Yes [name], FNP  atorvastatin (LIPITOR) 40 MG tablet Take 1 tablet (40 mg total) by mouth daily. 10/23/22  Yes [name], FNP  FARXIGA 10 MG TABS tablet Take 1 tablet (10 mg total) by mouth  daily. 12/04/22  Yes [name], FNP  gabapentin (NEURONTIN) 100 MG capsule Take 2 capsules (200 mg total) by mouth 2 (two) times daily. 09/21/22  Yes [name], FNP  spironolactone (ALDACTONE) 25 MG tablet Take 1 tablet (25 mg total) by mouth daily. 10/23/22  Yes [name], FNP  telmisartan (MICARDIS) 80 MG tablet Take 1 tablet (80 mg total) by mouth daily. 10/23/22  Yes [name], FNP  hydrocortisone (ANUSOL-HC) 25 MG suppository Place 1 suppository (25 mg total) rectally every 12 (twelve) hours. Patient not taking: Reported on 02/20/2023 02/19/23 02/19/24  [name], FNP  ondansetron (ZOFRAN) 4 MG tablet Take 1 tablet (4 mg total) by mouth daily as needed for nausea or vomiting. Patient not taking: Reported on 02/20/2023 02/19/23 02/19/24  [name], FNP    Current Facility-Administered Medications  Medication Dose Route Frequency Provider Last Rate Last Admin   0.9 %  sodium chloride infusion (Manually program via Guardrails IV Fluids)   Intravenous Once Virgina Norfolk, DO   Stopped at 02/20/23 2125   0.9 %  sodium chloride infusion (Manually program via Guardrails IV Fluids)   Intravenous Once Opyd, Lavone Neri, MD       acetaminophen (TYLENOL) tablet 650 mg  650 mg Oral Q6H PRN Mikey College T, MD   650 mg at 02/21/23 0514   amLODipine (NORVASC) tablet 10 mg  10 mg Oral Daily Mikey College T, MD   10 mg at 02/21/23 1109   atorvastatin (LIPITOR) tablet 40 mg  40 mg Oral Daily Mikey College T, MD   40 mg at 02/21/23 1109   carvedilol (COREG) tablet 6.25 mg  6.25 mg Oral BID WC Rai, Ripudeep K, MD       gabapentin (NEURONTIN) capsule 200 mg  200 mg Oral BID Mikey College T, MD   200 mg at 02/21/23 1610   hydrALAZINE (APRESOLINE) tablet 25 mg  25 mg Oral Q8H Mikey College T, MD   25 mg at 02/21/23 0514   hydrocortisone (ANUSOL-HC) suppository 25 mg  25 mg Rectal BID Jardin, Carla G, RPH       isosorbide mononitrate (IMDUR) 24 hr tablet 30 mg  30 mg Oral Daily Mikey College T, MD   30 mg at 02/21/23 9604    labetalol (NORMODYNE) injection 10 mg  10 mg Intravenous Q4H PRN Rai, Ripudeep K, MD       linagliptin (TRADJENTA) tablet 5 mg  5 mg Oral Daily Mikey College T, MD   5 mg at 02/21/23 0821   pantoprazole (PROTONIX) EC tablet 40 mg  40 mg Oral Q0600 Rai, Ripudeep K, MD   40 mg at 02/21/23 1114   sodium bicarbonate  tablet 650 mg  650 mg Oral BID Rai, Ripudeep K, MD        Allergies as of 02/20/2023 - Review Complete 02/20/2023  Allergen Reaction Noted   Lisinopril Cough 01/12/2015    Family History  Problem Relation Age of Onset   Hypertension Mother    Kidney failure Mother    Heart attack Father    Hypertension Sister    Diabetes Sister    Hypertension Brother    Diabetes Sister     Social History   Socioeconomic History   Marital status: Single    Spouse name: Not on file   Number of children: Not on file   Years of education: Not on file   Highest education level: Not on file  Occupational History   Occupation: disability  Tobacco Use   Smoking status: Every Day    Packs/day: 0.50    Years: 10.00    Additional pack years: 0.00    Total pack years: 5.00    Types: Cigarettes   Smokeless tobacco: Never   Tobacco comments:    trying to cut back, 3/29 - only had one cigarette today, average 4 a day  Vaping Use   Vaping Use: Never used  Substance and Sexual Activity   Alcohol use: Not Currently    Alcohol/week: 7.0 standard drinks of alcohol    Types: 7 Cans of beer per week    Comment: Beer once a week.   Drug use: No    Types: Cocaine    Comment: 05/12/2013 "tried it a few days ago; didn't like it"   Sexual activity: Not Currently  Other Topics Concern   Not on file  Social History Narrative   Not on file   Social Determinants of Health   Financial Resource Strain: High Risk (11/06/2022)   Overall Financial Resource Strain (CARDIA)    Difficulty of Paying Living Expenses: Very hard  Food Insecurity: No Food Insecurity (02/20/2023)   Hunger Vital Sign     Worried About Running Out of Food in the Last Year: Never true    Ran Out of Food in the Last Year: Never true  Transportation Needs: No Transportation Needs (02/20/2023)   PRAPARE - Administrator, Civil Service (Medical): No    Lack of Transportation (Non-Medical): No  Physical Activity: Insufficiently Active (11/06/2022)   Exercise Vital Sign    Days of Exercise per Week: 5 days    Minutes of Exercise per Session: 20 min  Stress: No Stress Concern Present (11/06/2022)   [employer] of Occupational Health - Occupational Stress Questionnaire    Feeling of Stress : Only a little  Social Connections: Moderately Isolated (11/06/2022)   Social Connection and Isolation Panel [NHANES]    Frequency of Communication with Friends and Family: Twice a week    Frequency of Social Gatherings with Friends and Family: Once a week    Attends Religious Services: More than 4 times per year    Active Member of [employer] or Organizations: No    Attends Banker Meetings: Never    Marital Status: Never married  Intimate Partner Violence: Not At Risk (02/20/2023)   Humiliation, Afraid, Rape, and Kick questionnaire    Fear of Current or Ex-Partner: No    Emotionally Abused: No    Physically Abused: No    Sexually Abused: No    Review of Systems: Gen: Denies fever, sweats or chills. No weight loss.  CV: Denies chest pain, palpitations or  edema. Resp: Denies cough, shortness of breath of hemoptysis.  GI: See HPI.  Patient denies having GERD symptoms. GU : Denies urinary burning, blood in urine, increased urinary frequency or incontinence. MS: Denies joint pain, muscles aches or weakness. Derm: Denies rash, itchiness, skin lesions or unhealing ulcers. Psych: Denies depression, anxiety, memory loss or confusion. Heme: Denies easy bruising, bleeding. Neuro: Bilateral numbness and tingling in both legs. Endo:  Denies any problems with DM, thyroid or adrenal function.  Physical  Exam: Vital signs in last 24 hours: Temp:  [97.5 F (36.4 C)-99.1 F (37.3 C)] 99.1 F (37.3 C) (07/03 0729) Pulse Rate:  [68-83] 72 (07/03 0941) Resp:  [16-20] 18 (07/03 0729) BP: (154-221)/(74-96) 166/79 (07/03 0941) SpO2:  [99 %-100 %] 100 % (07/03 0941) Last BM Date : 02/20/23 General:  Alert 61 year old female in no acute distress. Head:  Normocephalic and atraumatic. Eyes:  No scleral icterus. Conjunctiva pink. Ears:  Normal auditory acuity. Nose:  No deformity, discharge or lesions. Mouth:  Dentition intact. No ulcers or lesions.  Neck:  Supple. No lymphadenopathy or thyromegaly.  Lungs: Breath sounds clear throughout. No wheezes, rhonchi or crackles.  Heart: Regular rate and rhythm, 2 or 6 systolic murmur. Abdomen: Protuberant, soft, not tense.  Positive bowel sounds all 4 quadrants.  No hepatosplenomegaly.  No palpable mass. Rectal: Deferred. Musculoskeletal:  Symmetrical without gross deformities.  Pulses:  Normal pulses noted. Extremities:  Without clubbing or edema. Neurologic:  Alert and  oriented x 4. No focal deficits.  Skin:  Intact without significant lesions or rashes. Psych:  Alert and cooperative. Normal mood and affect.  Intake/Output from previous day: 07/02 0701 - 07/03 0700 In: 998.7 [I.V.:315; Blood:683.7] Out: -  Intake/Output this shift: Total I/O In: 240 [P.O.:240] Out: -   Lab Results: Recent Labs    02/20/23 1104 02/20/23 1109 02/21/23 0042 02/21/23 0657  WBC 8.1  --  7.5  --   HGB 6.0* 7.5* 6.8* 8.0*  HCT 23.0* 22.0* 23.8* 27.4*  PLT 421*  --  367  --    BMET Recent Labs    02/20/23 1104 02/20/23 1109 02/21/23 0027  NA 139 143 140  K 4.5 4.5 4.2  CL 110 113* 112*  CO2 18*  --  20*  GLUCOSE 93 93 88  BUN 29* 30* 29*  CREATININE 2.66* 2.90* 2.36*  CALCIUM 8.7*  --  8.3*   LFT Recent Labs    02/20/23 1104  PROT 7.7  ALBUMIN 3.3*  AST 16  ALT 11  ALKPHOS 55  BILITOT 0.6   PT/INR No results for input(s): "LABPROT",  "INR" in the last 72 hours. Hepatitis Panel No results for input(s): "HEPBSAG", "HCVAB", "HEPAIGM", "HEPBIGM" in the last 72 hours.    Studies/Results: US RENAL  Result Date: 02/20/2023 CLINICAL DATA:  [id number] AKI (acute kidney injury) (HCC) [id number] EXAM: RENAL / URINARY TRACT ULTRASOUND COMPLETE COMPARISON:  04/19/2021 FINDINGS: Right Kidney: Renal measurements: 11.6 x 4.9 x 5.6 cm = volume: 165 mL. Parenchyma is slightly hyperechoic compared to adjacent liver. 1.1 cm cyst in the upper pole. No hydronephrosis. Left Kidney: Renal measurements: 10.7 x 5.7 x 5 cm = volume: 159 mL. Echogenicity within normal limits. No mass or hydronephrosis visualized. Bladder: Appears normal for degree of bladder distention. Ureteral jets not demonstrated. Other: Technologist describes technically difficult study secondary to bowel gas and body habitus. IMPRESSION: No hydronephrosis or other acute finding. Echogenic renal parenchyma, a nonspecific finding seen in medical renal disease. Electronically Signed  By: Corlis Leak M.D.   On: 02/20/2023 15:23    IMPRESSION/PLAN:  62 year old female admitted to the hospital with profound microcytic anemia.  Admission hemoglobin level 6.0 -> transfused 2 units of PRBcs -> Hg 7.5 -> today Hg 6.8 -> transfused 1 unit of PRBCs -> Hg 8.0. Normal iron and B12 levels.  FOBT negative.  Intermittent rectal bleeding when constipated. Cornstarch pica. -Abdominal ultrasound to assess the liver and spleen -EGD and colonoscopy during this hospitalization, patient unsure at this time if she wishes to pursue any endoscopic evaluation as an inpatient -Diet as tolerated -Advised no alcohol -CBC in a.m.  Chronic constipation  -MiraLAX.  CKD stage III  Chronic diastolic CHF: ECHO 08/2018: Normal LV size with severe LV hypertrophy. EF 60-65%. Normal systolic function. No significant  valvular abnormalities.  Chronic tobacco use -Smoking cessation recommended   [name]   02/21/2023, 2:23 PM

## 2023-02-22 DIAGNOSIS — K625 Hemorrhage of anus and rectum: Secondary | ICD-10-CM | POA: Diagnosis not present

## 2023-02-22 DIAGNOSIS — N179 Acute kidney failure, unspecified: Secondary | ICD-10-CM | POA: Diagnosis not present

## 2023-02-22 DIAGNOSIS — K5909 Other constipation: Secondary | ICD-10-CM | POA: Diagnosis not present

## 2023-02-22 DIAGNOSIS — N183 Chronic kidney disease, stage 3 unspecified: Secondary | ICD-10-CM | POA: Diagnosis not present

## 2023-02-22 DIAGNOSIS — K219 Gastro-esophageal reflux disease without esophagitis: Secondary | ICD-10-CM

## 2023-02-22 DIAGNOSIS — D649 Anemia, unspecified: Secondary | ICD-10-CM | POA: Diagnosis not present

## 2023-02-22 DIAGNOSIS — N1832 Chronic kidney disease, stage 3b: Secondary | ICD-10-CM | POA: Diagnosis not present

## 2023-02-22 LAB — CBC
HCT: 27.2 % — ABNORMAL LOW (ref 36.0–46.0)
Hemoglobin: 8.1 g/dL — ABNORMAL LOW (ref 12.0–15.0)
MCH: 23 pg — ABNORMAL LOW (ref 26.0–34.0)
MCHC: 29.8 g/dL — ABNORMAL LOW (ref 30.0–36.0)
MCV: 77.3 fL — ABNORMAL LOW (ref 80.0–100.0)
Platelets: 371 10*3/uL (ref 150–400)
RBC: 3.52 MIL/uL — ABNORMAL LOW (ref 3.87–5.11)
RDW: 18.1 % — ABNORMAL HIGH (ref 11.5–15.5)
WBC: 9.3 10*3/uL (ref 4.0–10.5)
nRBC: 0.2 % (ref 0.0–0.2)

## 2023-02-22 LAB — BPAM RBC
Blood Product Expiration Date: 202407092359
Blood Product Expiration Date: 202407102359
Blood Product Expiration Date: 202407142359
Blood Product Expiration Date: 202407172359
ISSUE DATE / TIME: 202406191914
ISSUE DATE / TIME: 202407021322
ISSUE DATE / TIME: 202407030229
Unit Type and Rh: 7300

## 2023-02-22 LAB — BASIC METABOLIC PANEL
Anion gap: 8 (ref 5–15)
BUN: 34 mg/dL — ABNORMAL HIGH (ref 8–23)
CO2: 21 mmol/L — ABNORMAL LOW (ref 22–32)
Calcium: 8.7 mg/dL — ABNORMAL LOW (ref 8.9–10.3)
Chloride: 110 mmol/L (ref 98–111)
Creatinine, Ser: 2.54 mg/dL — ABNORMAL HIGH (ref 0.44–1.00)
GFR, Estimated: 21 mL/min — ABNORMAL LOW (ref >60)
Glucose, Bld: 94 mg/dL (ref 70–99)
Potassium: 4.7 mmol/L (ref 3.5–5.1)
Sodium: 139 mmol/L (ref 135–145)

## 2023-02-22 LAB — TYPE AND SCREEN
ABO/RH(D): B POS
Unit division: 0
Unit division: 0
Unit division: 0

## 2023-02-22 MED ORDER — PEG-KCL-NACL-NASULF-NA ASC-C 100 G PO SOLR
0.5000 | Freq: Once | ORAL | Status: AC
  Administered 2023-02-23: 100 g via ORAL
  Filled 2023-02-22 (×2): qty 1

## 2023-02-22 MED ORDER — PEG-KCL-NACL-NASULF-NA ASC-C 100 G PO SOLR: 0.5000 | Freq: Once | ORAL | Status: DC

## 2023-02-22 MED ORDER — PEG-KCL-NACL-NASULF-NA ASC-C 100 G PO SOLR
0.5000 | Freq: Once | ORAL | Status: AC
  Administered 2023-02-22: 100 g via ORAL
  Filled 2023-02-22: qty 1

## 2023-02-22 MED ORDER — BISACODYL 5 MG PO TBEC
10.0000 mg | DELAYED_RELEASE_TABLET | Freq: Once | ORAL | Status: AC
  Administered 2023-02-22: 10 mg via ORAL
  Filled 2023-02-22: qty 2

## 2023-02-22 MED ORDER — PEG-KCL-NACL-NASULF-NA ASC-C 100 G PO SOLR: 1.0000 | Freq: Once | ORAL | Status: DC

## 2023-02-22 MED ORDER — BISACODYL 5 MG PO TBEC
10.0000 mg | DELAYED_RELEASE_TABLET | Freq: Once | ORAL | Status: AC
  Administered 2023-02-23: 10 mg via ORAL
  Filled 2023-02-22: qty 2

## 2023-02-22 NOTE — Progress Notes (Signed)
Gastroenterology Inpatient Follow-up Note   PATIENT IDENTIFICATION  [name] is a 62 y.o. female Hospital Day: 3  SUBJECTIVE  The patient's chart has been reviewed. The patient's labs have been reviewed.  Her hemoglobin has up trended. Patient tolerated the IV iron infusion. The patient denies any progressive abdominal pain or discomfort. She feels her bowel movements are improved and although she had a little bit of bleeding from her hemorrhoids it is improved with the suppository therapy. She is interested in considering procedures she is just not sure if she would want to have that as an inpatient or not as she has a close friend who just passed away.  She may be amenable to outpatient procedures.   OBJECTIVE  Scheduled Inpatient Medications:   sodium chloride   Intravenous Once   sodium chloride   Intravenous Once   amLODipine  10 mg Oral Daily   atorvastatin  40 mg Oral Daily   bisacodyl  10 mg Oral Once   [START ON 02/23/2023] bisacodyl  10 mg Oral Once   carvedilol  6.25 mg Oral BID WC   gabapentin  200 mg Oral BID   hydrALAZINE  25 mg Oral Q8H   hydrocortisone  25 mg Rectal BID   isosorbide mononitrate  30 mg Oral Daily   linagliptin  5 mg Oral Daily   pantoprazole  40 mg Oral Q0600   peg 3350 powder  0.5 kit Oral Once   [START ON 02/23/2023] peg 3350 powder  1 kit Oral Once   sodium bicarbonate  650 mg Oral BID   Continuous Inpatient Infusions:  PRN Inpatient Medications: acetaminophen, labetalol   Physical Examination  Temp:  [98.4 F (36.9 C)-99.1 F (37.3 C)] 99.1 F (37.3 C) (07/04 0730) Pulse Rate:  [69-80] 77 (07/04 0730) Resp:  [16-18] 16 (07/04 0730) BP: (156-194)/(74-92) 165/80 (07/04 0730) SpO2:  [97 %-100 %] 100 % (07/04 0730) Temp (24hrs), Avg:98.9 F (37.2 C), Min:98.4 F (36.9 C), Max:99.1 F (37.3 C)  Weight: 91.5 kg GEN: NAD, appears stated age, doesn't appear chronically ill PSYCH: Cooperative, without pressured speech EYE:  Conjunctivae pink, sclerae anicteric ENT: MMM CV: Nontachycardic RESP: No audible wheezing GI: NABS, soft, protuberant abdomen, rounded, nontender, no rebound or guarding MSK/EXT: No significant lower extremity edema SKIN: No jaundice NEURO:  Alert & Oriented x 3, no focal deficits   Review of Data   Laboratory Studies   Recent Labs  Lab 02/22/23 0039  NA 139  K 4.7  CL 110  CO2 21*  BUN 34*  CREATININE 2.54*  GLUCOSE 94  CALCIUM 8.7*   Recent Labs  Lab 02/20/23 1104  AST 16  ALT 11  ALKPHOS 55    Recent Labs  Lab 02/20/23 1104 02/20/23 1109 02/21/23 0042 02/21/23 0657 02/22/23 0039  WBC 8.1  --  7.5  --  9.3  HGB 6.0*   < > 6.8*   < > 8.1*  HCT 23.0*   < > 23.8*   < > 27.2*  PLT 421*  --  367  --  371   < > = values in this interval not displayed.   No results for input(s): "APTT", "INR" in the last 168 hours.  Imaging Studies  US Abdomen Complete  Result Date: 02/21/2023 CLINICAL DATA:  Evaluate liver and spleen. EXAM: ABDOMEN ULTRASOUND COMPLETE COMPARISON:  02/05/2017 FINDINGS: Gallbladder: No gallstones or wall thickening visualized. The gallbladder is contracted. No sonographic Murphy sign noted by sonographer. Common bile duct: Diameter:  4.0 mm Liver: A focal hypoechoic structure is seen along the posterior aspect of the right lobe of the liver measuring 4.2 x 2.9 x 4.0 cm, which corresponds with known right adrenal nodule. Within normal limits in parenchymal echogenicity. Portal vein is patent on color Doppler imaging with normal direction of blood flow towards the liver. IVC: No abnormality visualized. Pancreas: Visualized portion unremarkable. Spleen: Size and appearance within normal limits. Right Kidney: Length: 10.2 cm. Echogenicity within normal limits. No mass or hydronephrosis visualized. Left Kidney: Length: 9.1 cm. Echogenicity within normal limits. No mass or hydronephrosis visualized. Abdominal aorta: No aneurysm visualized. Other findings: No  free fluid. IMPRESSION: 1. Normal evaluation of the liver and spleen. 2. Hypoechoic lesion along along the posterior aspect of the liver measuring 4.2 x 2.9 x 4.0 cm versus 3.7 x 3.1 cm in 2018, previously characterized as functional adrenal adenoma. Given enlargement over time, surgical consultation is recommended. Electronically Signed   By: Thornell Sartorius M.D.   On: 02/21/2023 22:23   US RENAL  Result Date: 02/20/2023 CLINICAL DATA:  [id number] AKI (acute kidney injury) (HCC) [id number] EXAM: RENAL / URINARY TRACT ULTRASOUND COMPLETE COMPARISON:  04/19/2021 FINDINGS: Right Kidney: Renal measurements: 11.6 x 4.9 x 5.6 cm = volume: 165 mL. Parenchyma is slightly hyperechoic compared to adjacent liver. 1.1 cm cyst in the upper pole. No hydronephrosis. Left Kidney: Renal measurements: 10.7 x 5.7 x 5 cm = volume: 159 mL. Echogenicity within normal limits. No mass or hydronephrosis visualized. Bladder: Appears normal for degree of bladder distention. Ureteral jets not demonstrated. Other: Technologist describes technically difficult study secondary to bowel gas and body habitus. IMPRESSION: No hydronephrosis or other acute finding. Echogenic renal parenchyma, a nonspecific finding seen in medical renal disease. Electronically Signed   By: Corlis Leak M.D.   On: 02/20/2023 15:23    GI Procedures and Studies  No relevant GI procedures   ASSESSMENT  [name] is a 62 y.o. female with a pmh significant for CHF, hypertension, hyperlipidemia, previously noted adrenal adenoma, chronic renal insufficiency, GERD, hemorrhoids.  Patient admitted with symptomatic blood loss anemia with potential need for endoscopic evaluation.  The patient is hemodynamically and clinically stable today.  The patient states that she is interested in endoscopic evaluation but is wondering if she would want to do it as an outpatient, due to the recent passing of a personal friend.  She is not having overt bleeding other than some small rectal  bleeding per her report which is hemorrhoidal in nature (though this still requires endoscopic evaluation at some point).  If the patient is willing to undergo procedures as an inpatient, I can offer her an EGD/colonoscopy tomorrow as long as she does not have lunch as a regular diet.  If the patient does not want to have procedures, then this can be relayed to me and I can work on getting her scheduled as an outpatient though this would likely be 3 to 6 weeks in the future for [place] to have an outpatient endoscopy/colonoscopy scheduled.  The patient is leaning towards having the procedure but the medicine service will evaluate her and go from there.  I will be available as needed.  The risks and benefits of endoscopic evaluation were discussed with the patient; these include but are not limited to the risk of perforation, infection, bleeding, missed lesions, lack of diagnosis, severe illness requiring hospitalization, as well as anesthesia and sedation related illnesses.   PLAN/RECOMMENDATIONS  Medicine service to evaluate patient and talk  and see if she would like to move forward with procedures as an inpatient If the patient agrees to EGD/colonoscopy as inpatient will pursue on 7/5 - Patient would need to be on clear liquid diet the rest of today - N.p.o. at midnight - Bowel regimen/bowel purge will be ordered by me if that is the case If the patient does not agree to EGD/colonoscopy as inpatient wants to pursue as outpatient, then I need to be made aware and I can have my team work on trying arrange though this would likely be 3 to 6 weeks in the future (pending her hemoglobin does not get less than 7 as an outpatient) Please let me know what the patient decides   Please page/call with questions or concerns.   Corliss Parish, MD [facility] Office # [phone]    LOS: 0 days  Lemar Lofty  02/22/2023, 10:25 AM    Addendum I have heard from the  medicine service that the patient would like to move forward with endoscopic evaluation as an inpatient.  I am happy to pursue this for her as it is an exception of her evaluation of her symptomatic anemia.  Clear liquid diet to begin now N.p.o. at midnight (other than small sips for medications) Dulcolax 10 mg at 12 PM today Moviprep one half at 5 PM today Moviprep one half at 0001 on 7/5 Dulcolax 10 mg at 0400 on 7/5 Plan for EGD/colonoscopy on 7/5

## 2023-02-22 NOTE — Plan of Care (Signed)
Patient alert/oriented X4. Patient compliant with medication administration and patient tolerated clear liquid diet. Patient administered first package of polyethylene glycol-3350. VSS, will continue to monitor.   Problem: Education: Goal: Knowledge of General Education information will improve Description: Including pain rating scale, medication(s)/side effects and non-pharmacologic comfort measures Outcome: Progressing   Problem: Health Behavior/Discharge Planning: Goal: Ability to manage health-related needs will improve Outcome: Progressing   Problem: Clinical Measurements: Goal: Ability to maintain clinical measurements within normal limits will improve Outcome: Progressing   Problem: Clinical Measurements: Goal: Will remain free from infection Outcome: Progressing   Problem: Clinical Measurements: Goal: Diagnostic test results will improve Outcome: Progressing   Problem: Clinical Measurements: Goal: Respiratory complications will improve Outcome: Progressing   Problem: Clinical Measurements: Goal: Cardiovascular complication will be avoided Outcome: Progressing   Problem: Nutrition: Goal: Adequate nutrition will be maintained Outcome: Progressing   Problem: Coping: Goal: Level of anxiety will decrease Outcome: Progressing   Problem: Elimination: Goal: Will not experience complications related to bowel motility Outcome: Progressing   Problem: Elimination: Goal: Will not experience complications related to urinary retention Outcome: Progressing   Problem: Pain Managment: Goal: General experience of comfort will improve Outcome: Progressing   Problem: Safety: Goal: Ability to remain free from injury will improve Outcome: Progressing   Problem: Skin Integrity: Goal: Risk for impaired skin integrity will decrease Outcome: Progressing

## 2023-02-22 NOTE — H&P (View-Only) (Signed)
 Gastroenterology Inpatient Follow-up Note   PATIENT IDENTIFICATION  [name] is a 62 y.o. female Hospital Day: 3  SUBJECTIVE  The patient's chart has been reviewed. The patient's labs have been reviewed.  Her hemoglobin has up trended. Patient tolerated the IV iron infusion. The patient denies any progressive abdominal pain or discomfort. She feels her bowel movements are improved and although she had a little bit of bleeding from her hemorrhoids it is improved with the suppository therapy. She is interested in considering procedures she is just not sure if she would want to have that as an inpatient or not as she has a close friend who just passed away.  She may be amenable to outpatient procedures.   OBJECTIVE  Scheduled Inpatient Medications:   sodium chloride   Intravenous Once   sodium chloride   Intravenous Once   amLODipine  10 mg Oral Daily   atorvastatin  40 mg Oral Daily   bisacodyl  10 mg Oral Once   [START ON 02/23/2023] bisacodyl  10 mg Oral Once   carvedilol  6.25 mg Oral BID WC   gabapentin  200 mg Oral BID   hydrALAZINE  25 mg Oral Q8H   hydrocortisone  25 mg Rectal BID   isosorbide mononitrate  30 mg Oral Daily   linagliptin  5 mg Oral Daily   pantoprazole  40 mg Oral Q0600   peg 3350 powder  0.5 kit Oral Once   [START ON 02/23/2023] peg 3350 powder  1 kit Oral Once   sodium bicarbonate  650 mg Oral BID   Continuous Inpatient Infusions:  PRN Inpatient Medications: acetaminophen, labetalol   Physical Examination  Temp:  [98.4 F (36.9 C)-99.1 F (37.3 C)] 99.1 F (37.3 C) (07/04 0730) Pulse Rate:  [69-80] 77 (07/04 0730) Resp:  [16-18] 16 (07/04 0730) BP: (156-194)/(74-92) 165/80 (07/04 0730) SpO2:  [97 %-100 %] 100 % (07/04 0730) Temp (24hrs), Avg:98.9 F (37.2 C), Min:98.4 F (36.9 C), Max:99.1 F (37.3 C)  Weight: 91.5 kg GEN: NAD, appears stated age, doesn't appear chronically ill PSYCH: Cooperative, without pressured speech EYE:  Conjunctivae pink, sclerae anicteric ENT: MMM CV: Nontachycardic RESP: No audible wheezing GI: NABS, soft, protuberant abdomen, rounded, nontender, no rebound or guarding MSK/EXT: No significant lower extremity edema SKIN: No jaundice NEURO:  Alert & Oriented x 3, no focal deficits   Review of Data   Laboratory Studies   Recent Labs  Lab 02/22/23 0039  NA 139  K 4.7  CL 110  CO2 21*  BUN 34*  CREATININE 2.54*  GLUCOSE 94  CALCIUM 8.7*   Recent Labs  Lab 02/20/23 1104  AST 16  ALT 11  ALKPHOS 55    Recent Labs  Lab 02/20/23 1104 02/20/23 1109 02/21/23 0042 02/21/23 0657 02/22/23 0039  WBC 8.1  --  7.5  --  9.3  HGB 6.0*   < > 6.8*   < > 8.1*  HCT 23.0*   < > 23.8*   < > 27.2*  PLT 421*  --  367  --  371   < > = values in this interval not displayed.   No results for input(s): "APTT", "INR" in the last 168 hours.  Imaging Studies  US Abdomen Complete  Result Date: 02/21/2023 CLINICAL DATA:  Evaluate liver and spleen. EXAM: ABDOMEN ULTRASOUND COMPLETE COMPARISON:  02/05/2017 FINDINGS: Gallbladder: No gallstones or wall thickening visualized. The gallbladder is contracted. No sonographic Murphy sign noted by sonographer. Common bile duct: Diameter:   4.0 mm Liver: A focal hypoechoic structure is seen along the posterior aspect of the right lobe of the liver measuring 4.2 x 2.9 x 4.0 cm, which corresponds with known right adrenal nodule. Within normal limits in parenchymal echogenicity. Portal vein is patent on color Doppler imaging with normal direction of blood flow towards the liver. IVC: No abnormality visualized. Pancreas: Visualized portion unremarkable. Spleen: Size and appearance within normal limits. Right Kidney: Length: 10.2 cm. Echogenicity within normal limits. No mass or hydronephrosis visualized. Left Kidney: Length: 9.1 cm. Echogenicity within normal limits. No mass or hydronephrosis visualized. Abdominal aorta: No aneurysm visualized. Other findings: No  free fluid. IMPRESSION: 1. Normal evaluation of the liver and spleen. 2. Hypoechoic lesion along along the posterior aspect of the liver measuring 4.2 x 2.9 x 4.0 cm versus 3.7 x 3.1 cm in 2018, previously characterized as functional adrenal adenoma. Given enlargement over time, surgical consultation is recommended. Electronically Signed   By: Summer  Chavez M.Summer.   On: 02/21/2023 22:23   US RENAL  Result Date: 02/20/2023 CLINICAL DATA:  [id number] AKI (acute kidney injury) (HCC) [id number] EXAM: RENAL / URINARY TRACT ULTRASOUND COMPLETE COMPARISON:  04/19/2021 FINDINGS: Right Kidney: Renal measurements: 11.6 x 4.9 x 5.6 cm = volume: 165 mL. Parenchyma is slightly hyperechoic compared to adjacent liver. 1.1 cm cyst in the upper pole. No hydronephrosis. Left Kidney: Renal measurements: 10.7 x 5.7 x 5 cm = volume: 159 mL. Echogenicity within normal limits. No mass or hydronephrosis visualized. Bladder: Appears normal for degree of bladder distention. Ureteral jets not demonstrated. Other: Technologist describes technically difficult study secondary to bowel gas and body habitus. IMPRESSION: No hydronephrosis or other acute finding. Echogenic renal parenchyma, a nonspecific finding seen in medical renal disease. Electronically Signed   By: Summer  Chavez M.Summer.   On: 02/20/2023 15:23    GI Procedures and Studies  No relevant GI procedures   ASSESSMENT  [name] is a 62 y.o. female with a pmh significant for CHF, hypertension, hyperlipidemia, previously noted adrenal adenoma, chronic renal insufficiency, GERD, hemorrhoids.  Patient admitted with symptomatic blood loss anemia with potential need for endoscopic evaluation.  The patient is hemodynamically and clinically stable today.  The patient states that she is interested in endoscopic evaluation but is wondering if she would want to do it as an outpatient, due to the recent passing of a personal friend.  She is not having overt bleeding other than some small rectal  bleeding per her report which is hemorrhoidal in nature (though this still requires endoscopic evaluation at some point).  If the patient is willing to undergo procedures as an inpatient, I can offer her an EGD/colonoscopy tomorrow as long as she does not have lunch as a regular diet.  If the patient does not want to have procedures, then this can be relayed to me and I can work on getting her scheduled as an outpatient though this would likely be 3 to 6 weeks in the future for us to have an outpatient endoscopy/colonoscopy scheduled.  The patient is leaning towards having the procedure but the medicine service will evaluate her and go from there.  I will be available as needed.  The risks and benefits of endoscopic evaluation were discussed with the patient; these include but are not limited to the risk of perforation, infection, bleeding, missed lesions, lack of diagnosis, severe illness requiring hospitalization, as well as anesthesia and sedation related illnesses.   PLAN/RECOMMENDATIONS  Medicine service to evaluate patient and talk   and see if she would like to move forward with procedures as an inpatient If the patient agrees to EGD/colonoscopy as inpatient will pursue on 7/5 - Patient would need to be on clear liquid diet the rest of today - N.p.o. at midnight - Bowel regimen/bowel purge will be ordered by me if that is the case If the patient does not agree to EGD/colonoscopy as inpatient wants to pursue as outpatient, then I need to be made aware and I can have my team work on trying arrange though this would likely be 3 to 6 weeks in the future (pending her hemoglobin does not get less than 7 as an outpatient) Please let me know what the patient decides   Please page/call with questions or concerns.   Zhuri Krass Mansouraty, MD [facility] Office # [phone]    LOS: 0 days  Javonda Suh Mansouraty Jr  02/22/2023, 10:25 AM    Addendum I have heard from the  medicine service that the patient would like to move forward with endoscopic evaluation as an inpatient.  I am happy to pursue this for her as it is an exception of her evaluation of her symptomatic anemia.  Clear liquid diet to begin now N.p.o. at midnight (other than small sips for medications) Dulcolax 10 mg at 12 PM today Moviprep one half at 5 PM today Moviprep one half at 0001 on 7/5 Dulcolax 10 mg at 0400 on 7/5 Plan for EGD/colonoscopy on 7/5 

## 2023-02-22 NOTE — Anesthesia Preprocedure Evaluation (Addendum)
Anesthesia Evaluation  Patient identified by MRN, date of birth, ID band Patient awake    Reviewed: Allergy & Precautions, NPO status , Patient's Chart, lab work & pertinent test results  History of Anesthesia Complications Negative for: history of anesthetic complications  Airway Mallampati: III  TM Distance: >3 FB Neck ROM: Full    Dental  (+) Dental Advisory Given   Pulmonary neg shortness of breath, neg sleep apnea, neg COPD, neg recent URI, Current Smoker and Patient abstained from smoking. Chronic bronchitis   Pulmonary exam normal breath sounds clear to auscultation       Cardiovascular hypertension, Pt. on medications (-) angina +CHF (diastolic)  (-) Past MI, (-) Cardiac Stents, (-) CABG, (-) Orthopnea and (-) PND (-) dysrhythmias + Valvular Problems/Murmurs  Rhythm:Regular Rate:Normal  HLD  MR Cardiac Morpholongy 02/18/2021: IMPRESSION: 1. Severe asymmetric basal-mid LV septal hypertrophy. maximal septal thickness 19mm.   2.  Normal LV and RV size and function.  LVEF 61%   3.  No evidence for late gadolinium enhancement or scar.   4.  No left ventricular outflow tract obstruction.   5. Findings consistent with HCM, asymmetric septal hypertrophy variant.  TTE 09/06/2018: Study Conclusions   - Left ventricle: The cavity size was normal. Wall thickness was    increased in a pattern of severe LVH. Systolic function was    normal. The estimated ejection fraction was in the range of 60%    to 65%. Wall motion was normal; there were no regional wall    motion abnormalities. Doppler parameters are consistent with    abnormal left ventricular relaxation (grade 1 diastolic    dysfunction).  - Aortic valve: There was no stenosis. There was trivial    regurgitation.  - Mitral valve: There was trivial regurgitation.  - Left atrium: The atrium was moderately dilated.  - Right ventricle: The cavity size was normal.  Systolic function    was normal.  - Right atrium: The atrium was mildly dilated.  - Pulmonary arteries: No complete TR doppler jet so unable to    estimate PA systolic pressure.  - Inferior vena cava: The vessel was normal in size. The    respirophasic diameter changes were in the normal range (>= 50%),    consistent with normal central venous pressure.     Neuro/Psych negative neurological ROS     GI/Hepatic Bowel prep,GERD  Medicated,,(+)     substance abuse  cocaine use  Endo/Other  Adrenal adenoma, hyperaldosteronism  Renal/GU CRFRenal disease     Musculoskeletal   Abdominal   Peds  Hematology  (+) Blood dyscrasia, anemia   Anesthesia Other Findings 62 y.o. female with a pmh significant for CHF, hypertension, hyperlipidemia, previously noted adrenal adenoma, chronic renal insufficiency, GERD, hemorrhoids.  Patient admitted with symptomatic blood loss anemia  Hgb 7.5  Reproductive/Obstetrics                              Anesthesia Physical Anesthesia Plan  ASA: 3  Anesthesia Plan: MAC   Post-op Pain Management:    Induction: Intravenous  PONV Risk Score and Plan: 1 and Propofol infusion and Treatment may vary due to age or medical condition  Airway Management Planned: Natural Airway and Nasal Cannula  Additional Equipment:   Intra-op Plan:   Post-operative Plan:   Informed Consent: I have reviewed the patients History and Physical, chart, labs and discussed the procedure including the risks, benefits and  alternatives for the proposed anesthesia with the patient or authorized representative who has indicated his/her understanding and acceptance.     Dental advisory given  Plan Discussed with: CRNA and Anesthesiologist  Anesthesia Plan Comments: (Discussed with patient risks of MAC including, but not limited to, minor pain or discomfort, hearing people in the room, and possible need for backup general anesthesia. Risks for  general anesthesia also discussed including, but not limited to, sore throat, hoarse voice, chipped/damaged teeth, injury to vocal cords, nausea and vomiting, allergic reactions, lung infection, heart attack, stroke, and death. All questions answered. )        Anesthesia Quick Evaluation

## 2023-02-22 NOTE — Progress Notes (Addendum)
[facility]                                                                              [name], is a 62 y.o. female, DOB - Aug 25, 1960, ZOX:[id number] Admit date - 02/20/2023    Outpatient Primary MD for the patient is [name], FNP  LOS - 0  days  Chief Complaint  Patient presents with   Abnormal Lab       Brief summary   Patient is a 62 year old female with HTN, chronic HFpEF, HLD, GERD, chronic kidney disease stage IIIb was sent from PCPs office for anemia.  Patient reported generalized weakness, malaise 3 days ago, exertional dyspnea no cough, no chest pain.  Denied any abdominal pain, rectal bleeding or dark-colored stools.  She does have chronic hemorrhoids, occasionally has bleeding when wiping.  No weight loss, no change of bowel habits.  Patient went to see her PCP and was found to have low hemoglobin and presented to ED. In ED, BP elevated 170/80, hemoglobin 6.0, baseline hemoglobin 10.  MCV 77, RDW 18, creatinine 2.9, baseline 1.9, CO2 18    Assessment & Plan    Principal Problem:   Symptomatic anemia, acute on chronic, microcytic -Unclear etiology, has history of hemorrhoids (occasionally has rectal bleeding), GERD, has been taking NSAIDs occasionally but no recent melena or hematochezia.  FOBT negative. -Anemia panel showed iron 141, TIBC 396, ferritin 18, folate 19.0, B12 415 -LDH 114, haptoglobin pending -Presented with hemoglobin of 6.0, received 3 units packed RBCs, IV iron on 7/3 -Hemoglobin 8.1, discussed with patient, she is willing to proceed with endoscopy while inpatient, notified GI.  Plan for scope tomorrow. -If endoscopy negative and persistent anemia, will consult hematology.  Active Problems: Acute on CKD (chronic kidney disease) stage 3b, GFR 30-59 ml/min (HCC), metabolic acidosis -Likely worsened due to severe anemia, baseline creatinine 1.9, presented with creatinine of 2.9 -Status post 3 units packed RBCs -Renal  ultrasound showed no hydronephrosis or acute findings, medical renal disease -Continue to hold spironolactone, telmisartan -Placed on sodium bicarb tabs, received IV iron overnight, creatinine still trending up 2.5 today (<2.3 on 7/3 ) - Follows [facility], Dr. Thedore Mins, will consult renal if creatinine continues to trend up.  Accelerated hypertension, underlying history of hyperaldosteronism -Holding spironolactone, telmisartan due to AKI on CKD 3B -Continue Imdur, amlodipine, Coreg, hydralazine  -Labetalol IV as needed with parameters    Hyperlipemia Continue Lipitor    GERD (gastroesophageal reflux disease) -Continue Protonix 40 mg daily  Hemorrhoids -Continue Anusol suppository  Obesity Estimated body mass index is 30.67 kg/m as calculated from the following:   Height as of this encounter: 5\' 8"  (1.727 m).   Weight as of this encounter: 91.5 kg.  Code Status: Full code DVT Prophylaxis:  SCDs Start: 02/20/23 1331   Level of Care: Level of care: Med-Surg Family Communication: Updated patient Disposition Plan:      Remains inpatient appropriate:   Plan for endoscopy while inpatient, patient is now agreeable   Procedures:    Consultants:   GI  Antimicrobials:     Medications  amLODipine  10 mg Oral Daily  atorvastatin  40 mg Oral Daily   [START ON 02/23/2023] bisacodyl  10 mg Oral Once   carvedilol  6.25 mg Oral BID WC   gabapentin  200 mg Oral BID   hydrALAZINE  25 mg Oral Q8H   hydrocortisone  25 mg Rectal BID   isosorbide mononitrate  30 mg Oral Daily   linagliptin  5 mg Oral Daily   pantoprazole  40 mg Oral Q0600   peg 3350 powder  0.5 kit Oral Once   And   [START ON 02/23/2023] peg 3350 powder  0.5 kit Oral Once   sodium bicarbonate  650 mg Oral BID      Subjective:   [name] was seen and examined today.  Overnight BP elevated.  No nausea vomiting, abdominal pain or bleeding.  Sad today due to passing of her close friend  yesterday.  Objective:   Vitals:   02/21/23 1947 02/22/23 0329 02/22/23 0415 02/22/23 0730  BP: (!) 162/80 (!) 194/92 (!) 182/85 (!) 165/80  Pulse: 75 75 69 77  Resp: 18 18  16   Temp: 99.1 F (37.3 C) 98.9 F (37.2 C)  99.1 F (37.3 C)  TempSrc: Oral Oral  Oral  SpO2: 97% 100%  100%  Weight:      Height:       No intake or output data in the 24 hours ending 02/22/23 1421    Wt Readings from Last 3 Encounters:  02/20/23 91.5 kg  02/19/23 91.5 kg  10/04/22 95.3 kg   Physical Exam General: Alert and oriented x 3, NAD Cardiovascular: S1 S2 clear, RRR.  Respiratory: CTAB, no wheezing Gastrointestinal: Soft, nontender, nondistended, NBS Ext: no pedal edema bilaterally Neuro: no new deficits   Data Reviewed:  I have personally reviewed following labs    CBC Lab Results  Component Value Date   WBC 9.3 02/22/2023   RBC 3.52 (L) 02/22/2023   HGB 8.1 (L) 02/22/2023   HCT 27.2 (L) 02/22/2023   MCV 77.3 (L) 02/22/2023   MCH 23.0 (L) 02/22/2023   PLT 371 02/22/2023   MCHC 29.8 (L) 02/22/2023   RDW 18.1 (H) 02/22/2023   LYMPHSABS 1.1 04/08/2022   MONOABS 0.6 04/08/2022   EOSABS 0.0 04/08/2022   BASOSABS 0.0 04/08/2022     Last metabolic panel Lab Results  Component Value Date   NA 139 02/22/2023   K 4.7 02/22/2023   CL 110 02/22/2023   CO2 21 (L) 02/22/2023   BUN 34 (H) 02/22/2023   CREATININE 2.54 (H) 02/22/2023   GLUCOSE 94 02/22/2023   GFRNONAA 21 (L) 02/22/2023   GFRAA 48 (L) 10/11/2020   CALCIUM 8.7 (L) 02/22/2023   PHOS 4.0 11/16/2020   PROT 7.7 02/20/2023   ALBUMIN 3.3 (L) 02/20/2023   LABGLOB 3.6 02/19/2023   AGRATIO 1.3 08/23/2021   BILITOT 0.6 02/20/2023   ALKPHOS 55 02/20/2023   AST 16 02/20/2023   ALT 11 02/20/2023   ANIONGAP 8 02/22/2023    CBG (last 3)  No results for input(s): "GLUCAP" in the last 72 hours.    Coagulation Profile: No results for input(s): "INR", "PROTIME" in the last 168 hours.   Radiology Studies: I have  personally reviewed the imaging studies  US Abdomen Complete  Result Date: 02/21/2023 CLINICAL DATA:  Evaluate liver and spleen. EXAM: ABDOMEN ULTRASOUND COMPLETE COMPARISON:  02/05/2017 FINDINGS: Gallbladder: No gallstones or wall thickening visualized. The gallbladder is contracted. No sonographic Murphy sign noted by sonographer. Common bile duct: Diameter: 4.0 mm  Liver: A focal hypoechoic structure is seen along the posterior aspect of the right lobe of the liver measuring 4.2 x 2.9 x 4.0 cm, which corresponds with known right adrenal nodule. Within normal limits in parenchymal echogenicity. Portal vein is patent on color Doppler imaging with normal direction of blood flow towards the liver. IVC: No abnormality visualized. Pancreas: Visualized portion unremarkable. Spleen: Size and appearance within normal limits. Right Kidney: Length: 10.2 cm. Echogenicity within normal limits. No mass or hydronephrosis visualized. Left Kidney: Length: 9.1 cm. Echogenicity within normal limits. No mass or hydronephrosis visualized. Abdominal aorta: No aneurysm visualized. Other findings: No free fluid. IMPRESSION: 1. Normal evaluation of the liver and spleen. 2. Hypoechoic lesion along along the posterior aspect of the liver measuring 4.2 x 2.9 x 4.0 cm versus 3.7 x 3.1 cm in 2018, previously characterized as functional adrenal adenoma. Given enlargement over time, surgical consultation is recommended. Electronically Signed   By: Thornell Sartorius M.D.   On: 02/21/2023 22:23   US RENAL  Result Date: 02/20/2023 CLINICAL DATA:  [id number] AKI (acute kidney injury) (HCC) [id number] EXAM: RENAL / URINARY TRACT ULTRASOUND COMPLETE COMPARISON:  04/19/2021 FINDINGS: Right Kidney: Renal measurements: 11.6 x 4.9 x 5.6 cm = volume: 165 mL. Parenchyma is slightly hyperechoic compared to adjacent liver. 1.1 cm cyst in the upper pole. No hydronephrosis. Left Kidney: Renal measurements: 10.7 x 5.7 x 5 cm = volume: 159 mL. Echogenicity within normal  limits. No mass or hydronephrosis visualized. Bladder: Appears normal for degree of bladder distention. Ureteral jets not demonstrated. Other: Technologist describes technically difficult study secondary to bowel gas and body habitus. IMPRESSION: No hydronephrosis or other acute finding. Echogenic renal parenchyma, a nonspecific finding seen in medical renal disease. Electronically Signed   By: Corlis Leak M.D.   On: 02/20/2023 15:23       Doralene Glanz M.D. [facility] 02/22/2023, 2:21 PM  Available via Epic secure chat 7am-7pm After 7 pm, please refer to night coverage provider listed on amion.

## 2023-02-22 NOTE — Care Management Obs Status (Signed)
MEDICARE OBSERVATION STATUS NOTIFICATION   Patient Details  Name: [name] MRN: [id number] Date of Birth: 03/14/1961   Medicare Observation Status Notification Given:  Yes    Tom-Johnson, Hershal Coria, RN 02/22/2023, 1:49 PM

## 2023-02-23 ENCOUNTER — Encounter (HOSPITAL_COMMUNITY)
Admission: EM | Disposition: A | Discharge status: Home / Self Care | Payer: Self-pay | Source: Home / Self Care | Attending: Internal Medicine

## 2023-02-23 ENCOUNTER — Inpatient Hospital Stay (HOSPITAL_COMMUNITY): Payer: 59 | Admitting: Anesthesiology

## 2023-02-23 ENCOUNTER — Encounter (HOSPITAL_COMMUNITY): Payer: Self-pay | Admitting: Internal Medicine

## 2023-02-23 DIAGNOSIS — K644 Residual hemorrhoidal skin tags: Secondary | ICD-10-CM | POA: Diagnosis not present

## 2023-02-23 DIAGNOSIS — D509 Iron deficiency anemia, unspecified: Secondary | ICD-10-CM

## 2023-02-23 DIAGNOSIS — Z888 Allergy status to other drugs, medicaments and biological substances status: Secondary | ICD-10-CM | POA: Diagnosis not present

## 2023-02-23 DIAGNOSIS — K2970 Gastritis, unspecified, without bleeding: Secondary | ICD-10-CM | POA: Diagnosis not present

## 2023-02-23 DIAGNOSIS — D123 Benign neoplasm of transverse colon: Secondary | ICD-10-CM | POA: Diagnosis not present

## 2023-02-23 DIAGNOSIS — N183 Chronic kidney disease, stage 3 unspecified: Secondary | ICD-10-CM | POA: Diagnosis not present

## 2023-02-23 DIAGNOSIS — F17210 Nicotine dependence, cigarettes, uncomplicated: Secondary | ICD-10-CM | POA: Diagnosis present

## 2023-02-23 DIAGNOSIS — N179 Acute kidney failure, unspecified: Secondary | ICD-10-CM | POA: Diagnosis present

## 2023-02-23 DIAGNOSIS — F5089 Other specified eating disorder: Secondary | ICD-10-CM | POA: Diagnosis present

## 2023-02-23 DIAGNOSIS — E269 Hyperaldosteronism, unspecified: Secondary | ICD-10-CM | POA: Diagnosis present

## 2023-02-23 DIAGNOSIS — I129 Hypertensive chronic kidney disease with stage 1 through stage 4 chronic kidney disease, or unspecified chronic kidney disease: Secondary | ICD-10-CM | POA: Diagnosis not present

## 2023-02-23 DIAGNOSIS — I130 Hypertensive heart and chronic kidney disease with heart failure and stage 1 through stage 4 chronic kidney disease, or unspecified chronic kidney disease: Secondary | ICD-10-CM | POA: Diagnosis present

## 2023-02-23 DIAGNOSIS — E7800 Pure hypercholesterolemia, unspecified: Secondary | ICD-10-CM | POA: Diagnosis present

## 2023-02-23 DIAGNOSIS — K649 Unspecified hemorrhoids: Secondary | ICD-10-CM | POA: Diagnosis not present

## 2023-02-23 DIAGNOSIS — R1110 Vomiting, unspecified: Secondary | ICD-10-CM | POA: Diagnosis not present

## 2023-02-23 DIAGNOSIS — D3500 Benign neoplasm of unspecified adrenal gland: Secondary | ICD-10-CM | POA: Diagnosis present

## 2023-02-23 DIAGNOSIS — K2950 Unspecified chronic gastritis without bleeding: Secondary | ICD-10-CM

## 2023-02-23 DIAGNOSIS — E669 Obesity, unspecified: Secondary | ICD-10-CM | POA: Diagnosis present

## 2023-02-23 DIAGNOSIS — B9681 Helicobacter pylori [H. pylori] as the cause of diseases classified elsewhere: Secondary | ICD-10-CM | POA: Diagnosis not present

## 2023-02-23 DIAGNOSIS — K31819 Angiodysplasia of stomach and duodenum without bleeding: Secondary | ICD-10-CM

## 2023-02-23 DIAGNOSIS — K2289 Other specified disease of esophagus: Secondary | ICD-10-CM

## 2023-02-23 DIAGNOSIS — K635 Polyp of colon: Secondary | ICD-10-CM

## 2023-02-23 DIAGNOSIS — K642 Third degree hemorrhoids: Secondary | ICD-10-CM

## 2023-02-23 DIAGNOSIS — K449 Diaphragmatic hernia without obstruction or gangrene: Secondary | ICD-10-CM | POA: Diagnosis not present

## 2023-02-23 DIAGNOSIS — Z87891 Personal history of nicotine dependence: Secondary | ICD-10-CM | POA: Diagnosis not present

## 2023-02-23 DIAGNOSIS — D126 Benign neoplasm of colon, unspecified: Secondary | ICD-10-CM

## 2023-02-23 DIAGNOSIS — D649 Anemia, unspecified: Secondary | ICD-10-CM | POA: Diagnosis not present

## 2023-02-23 DIAGNOSIS — K5909 Other constipation: Secondary | ICD-10-CM | POA: Diagnosis present

## 2023-02-23 DIAGNOSIS — I5032 Chronic diastolic (congestive) heart failure: Secondary | ICD-10-CM | POA: Diagnosis present

## 2023-02-23 DIAGNOSIS — Z79899 Other long term (current) drug therapy: Secondary | ICD-10-CM | POA: Diagnosis not present

## 2023-02-23 DIAGNOSIS — K219 Gastro-esophageal reflux disease without esophagitis: Secondary | ICD-10-CM | POA: Diagnosis present

## 2023-02-23 DIAGNOSIS — N1832 Chronic kidney disease, stage 3b: Secondary | ICD-10-CM | POA: Diagnosis present

## 2023-02-23 DIAGNOSIS — D62 Acute posthemorrhagic anemia: Secondary | ICD-10-CM | POA: Diagnosis present

## 2023-02-23 DIAGNOSIS — E872 Acidosis, unspecified: Secondary | ICD-10-CM | POA: Diagnosis present

## 2023-02-23 DIAGNOSIS — Z7982 Long term (current) use of aspirin: Secondary | ICD-10-CM | POA: Diagnosis not present

## 2023-02-23 HISTORY — PX: POLYPECTOMY: SHX5525

## 2023-02-23 HISTORY — PX: COLONOSCOPY: SHX5424

## 2023-02-23 HISTORY — PX: HOT HEMOSTASIS: SHX5433

## 2023-02-23 HISTORY — PX: ESOPHAGOGASTRODUODENOSCOPY: SHX5428

## 2023-02-23 HISTORY — PX: BIOPSY: SHX5522

## 2023-02-23 LAB — BASIC METABOLIC PANEL
Anion gap: 10 (ref 5–15)
BUN: 29 mg/dL — ABNORMAL HIGH (ref 8–23)
CO2: 21 mmol/L — ABNORMAL LOW (ref 22–32)
Calcium: 8.4 mg/dL — ABNORMAL LOW (ref 8.9–10.3)
Chloride: 110 mmol/L (ref 98–111)
Creatinine, Ser: 2.23 mg/dL — ABNORMAL HIGH (ref 0.44–1.00)
GFR, Estimated: 24 mL/min — ABNORMAL LOW (ref >60)
Glucose, Bld: 88 mg/dL (ref 70–99)
Potassium: 4.3 mmol/L (ref 3.5–5.1)
Sodium: 141 mmol/L (ref 135–145)

## 2023-02-23 LAB — CBC
HCT: 25.9 % — ABNORMAL LOW (ref 36.0–46.0)
Hemoglobin: 7.5 g/dL — ABNORMAL LOW (ref 12.0–15.0)
MCH: 23.1 pg — ABNORMAL LOW (ref 26.0–34.0)
MCHC: 29 g/dL — ABNORMAL LOW (ref 30.0–36.0)
MCV: 79.9 fL — ABNORMAL LOW (ref 80.0–100.0)
Platelets: 324 10*3/uL (ref 150–400)
RBC: 3.24 MIL/uL — ABNORMAL LOW (ref 3.87–5.11)
RDW: 19.1 % — ABNORMAL HIGH (ref 11.5–15.5)
WBC: 8 10*3/uL (ref 4.0–10.5)
nRBC: 0.2 % (ref 0.0–0.2)

## 2023-02-23 LAB — HAPTOGLOBIN: Haptoglobin: 278 mg/dL (ref 37–355)

## 2023-02-23 SURGERY — EGD (ESOPHAGOGASTRODUODENOSCOPY)
Anesthesia: Monitor Anesthesia Care

## 2023-02-23 MED ORDER — HYDRALAZINE HCL 50 MG PO TABS: 50.0000 mg | ORAL_TABLET | Freq: Three times a day (TID) | ORAL | 2 refills | Status: DC

## 2023-02-23 MED ORDER — PROPOFOL 10 MG/ML IV BOLUS
INTRAVENOUS | Status: DC | PRN
  Administered 2023-02-23: 30 mg via INTRAVENOUS
  Administered 2023-02-23: 40 mg via INTRAVENOUS

## 2023-02-23 MED ORDER — HYDROCORTISONE ACETATE 25 MG RE SUPP: 25.0000 mg | Freq: Two times a day (BID) | RECTAL | 1 refills | Status: AC

## 2023-02-23 MED ORDER — SODIUM BICARBONATE 325 MG PO TABS: 325.0000 mg | ORAL_TABLET | Freq: Two times a day (BID) | ORAL | 0 refills | Status: AC

## 2023-02-23 MED ORDER — CARVEDILOL 6.25 MG PO TABS: 6.2500 mg | ORAL_TABLET | Freq: Two times a day (BID) | ORAL | 3 refills | Status: DC

## 2023-02-23 MED ORDER — GLYCOPYRROLATE PF 0.2 MG/ML IJ SOSY
PREFILLED_SYRINGE | INTRAMUSCULAR | Status: DC | PRN
  Administered 2023-02-23: .2 mg via INTRAVENOUS

## 2023-02-23 MED ORDER — SUCRALFATE 1 G PO TABS
1.0000 g | ORAL_TABLET | Freq: Two times a day (BID) | ORAL | Status: DC
  Administered 2023-02-23: 1 g via ORAL
  Filled 2023-02-23: qty 1

## 2023-02-23 MED ORDER — SPIRONOLACTONE 25 MG PO TABS
25.0000 mg | ORAL_TABLET | Freq: Every day | ORAL | Status: DC
  Administered 2023-02-23: 25 mg via ORAL
  Filled 2023-02-23: qty 1

## 2023-02-23 MED ORDER — HYDRALAZINE HCL 50 MG PO TABS
50.0000 mg | ORAL_TABLET | Freq: Three times a day (TID) | ORAL | Status: DC
  Administered 2023-02-23: 50 mg via ORAL
  Filled 2023-02-23: qty 1

## 2023-02-23 MED ORDER — HYDROCORTISONE ACETATE 25 MG RE SUPP
25.0000 mg | Freq: Every day | RECTAL | Status: DC
  Filled 2023-02-23: qty 1

## 2023-02-23 MED ORDER — FERROUS GLUCONATE 324 (38 FE) MG PO TABS: 324.0000 mg | ORAL_TABLET | Freq: Every day | ORAL | 3 refills | Status: DC

## 2023-02-23 MED ORDER — LACTATED RINGERS IV SOLN: INTRAVENOUS | Status: DC

## 2023-02-23 MED ORDER — LIDOCAINE 2% (20 MG/ML) 5 ML SYRINGE
INTRAMUSCULAR | Status: DC | PRN
  Administered 2023-02-23: 100 mg via INTRAVENOUS

## 2023-02-23 MED ORDER — ISOSORBIDE MONONITRATE ER 30 MG PO TB24
60.0000 mg | ORAL_TABLET | Freq: Every day | ORAL | Status: DC
  Administered 2023-02-23: 60 mg via ORAL
  Filled 2023-02-23: qty 2

## 2023-02-23 MED ORDER — PANTOPRAZOLE SODIUM 40 MG PO TBEC: 40.0000 mg | DELAYED_RELEASE_TABLET | Freq: Two times a day (BID) | ORAL | Status: DC

## 2023-02-23 MED ORDER — ETOMIDATE 2 MG/ML IV SOLN
INTRAVENOUS | Status: DC | PRN
  Administered 2023-02-23 (×2): 4 mg via INTRAVENOUS

## 2023-02-23 MED ORDER — ISOSORBIDE MONONITRATE ER 60 MG PO TB24: 60.0000 mg | ORAL_TABLET | Freq: Every day | ORAL | 3 refills | Status: DC

## 2023-02-23 MED ORDER — PANTOPRAZOLE SODIUM 40 MG PO TBEC: 40.0000 mg | DELAYED_RELEASE_TABLET | Freq: Two times a day (BID) | ORAL | 3 refills | Status: DC

## 2023-02-23 MED ORDER — ONDANSETRON HCL 4 MG PO TABS: 4.0000 mg | ORAL_TABLET | Freq: Three times a day (TID) | ORAL | 1 refills | Status: DC | PRN

## 2023-02-23 MED ORDER — SUCRALFATE 1 G PO TABS: 1.0000 g | ORAL_TABLET | Freq: Two times a day (BID) | ORAL | 0 refills | Status: DC

## 2023-02-23 MED ORDER — PROPOFOL 500 MG/50ML IV EMUL
INTRAVENOUS | Status: DC | PRN
  Administered 2023-02-23: 100 ug/kg/min via INTRAVENOUS

## 2023-02-23 NOTE — Op Note (Signed)
[facility] Patient Name: [name] Procedure Date : 02/23/2023 MRN: [id number] Attending MD: Corliss Parish , MD, [id number] Date of Birth: June 07, 1961 CSN: [id number] Age: 62 Admit Type: Inpatient Procedure:                Colonoscopy Indications:              This is the patient's first colonoscopy, Anal                            bleeding, Iron deficiency anemia Providers:                Corliss Parish, MD, Fransisca Connors, Cephus Richer, RN, Marja Kays, Technician Referring MD:             Inpatient medical service Medicines:                Monitored Anesthesia Care Complications:            No immediate complications. Estimated Blood Loss:     Estimated blood loss was minimal. Procedure:                Pre-Anesthesia Assessment:                           - Prior to the procedure, a History and Physical                            was performed, and patient medications and                            allergies were reviewed. The patient's tolerance of                            previous anesthesia was also reviewed. The risks                            and benefits of the procedure and the sedation                            options and risks were discussed with the patient.                            All questions were answered, and informed consent                            was obtained. Prior Anticoagulants: The patient has                            taken no anticoagulant or antiplatelet agents                            except for aspirin. ASA Grade Assessment: III - A  patient with severe systemic disease. After                            reviewing the risks and benefits, the patient was                            deemed in satisfactory condition to undergo the                            procedure.                           After obtaining informed consent, the colonoscope                             was passed under direct vision. Throughout the                            procedure, the patient's blood pressure, pulse, and                            oxygen saturations were monitored continuously. The                            CF-HQ190L ([id number]) Olympus coloscope was                            introduced through the anus and advanced to the 3                            cm into the ileum. The colonoscopy was somewhat                            difficult due to a redundant colon and significant                            looping. Successful completion of the procedure was                            aided by changing the patient's position, using                            manual pressure, straightening and shortening the                            scope to obtain bowel loop reduction and using                            scope torsion. The patient tolerated the procedure.                            The quality of the bowel preparation was fair. The  terminal ileum, ileocecal valve, appendiceal                            orifice, and rectum were photographed. Scope In: 12:14:36 PM Scope Out: 12:35:22 PM Scope Withdrawal Time: 0 hours 16 minutes 30 seconds  Total Procedure Duration: 0 hours 20 minutes 46 seconds  Findings:      Skin tags were found on perianal exam.      The digital rectal exam findings include hemorrhoids. Pertinent       negatives include no palpable rectal lesions.      Copious quantities of semi-liquid stool was found in the entire colon,       making visualization difficult. Lavage of the area was performed using       copious amounts, resulting in clearance with only fair visualization.      A 7 mm polyp was found in the transverse colon. The polyp was sessile.       The polyp was removed with a cold snare. Resection and retrieval were       complete.      Normal mucosa was found in the entire colon otherwise in the area that        was able to be visualized without evidence of any large masses.      Non-bleeding non-thrombosed external and internal hemorrhoids were found       during retroflexion, during perianal exam and during digital exam. The       hemorrhoids were Grade III (internal hemorrhoids that prolapse but       require manual reduction). Impression:               - Preparation of the colon was fair even after                            copious lavage.                           - Perianal skin tags found on perianal exam.                            Hemorrhoids found on digital rectal exam.                           - Stool in the entire examined colon.                           - One 7 mm polyp in the transverse colon, removed                            with a cold snare. Resected and retrieved.                           - Normal mucosa in the entire examined colon that                            could be visualized without any large masses or  lesions otherwise.                           - Non-bleeding non-thrombosed external and internal                            hemorrhoids. Recommendation:           - The patient will be observed post-procedure,                            until all discharge criteria are met.                           - Discharge patient to home.                           - Patient has a contact number available for                            emergencies. The signs and symptoms of potential                            delayed complications were discussed with the                            patient. Return to normal activities tomorrow.                            Written discharge instructions were provided to the                            patient.                           - High fiber diet.                           - Use FiberCon 1-2 tablets PO daily.                           - Continue present medications.                           - Await  pathology results.                           - Repeat colonoscopy within 1 year because the                            bowel preparation was inadequate for colon cancer                            screening purposes (recommend patient be done in                            the hospital-based setting and consider  glycopyrrolate upfront).                           - The patient should have received IV iron as an                            inpatient and should be set up for hematology                            follow-up so that she can get IV iron as an                            outpatient.                           - No further inpatient GI workup is required at                            this time. Should the patient continue to have iron                            deficiency anemia in future, then would consider                            video capsule endoscopy.                           -Inpatient GI will sign off at this time, but                            please call with questions.                           - The findings and recommendations were discussed                            with the patient.                           - The findings and recommendations were discussed                            with the referring physician. Procedure Code(s):        --- Professional ---                           [id number], Colonoscopy, flexible; with removal of                            tumor(s), polyp(s), or other lesion(s) by snare                            technique Diagnosis Code(s):        --- Professional ---  K64.2, Third degree hemorrhoids                           D12.3, Benign neoplasm of transverse colon (hepatic                            flexure or splenic flexure)                           K64.4, Residual hemorrhoidal skin tags                           K62.5, Hemorrhage of anus and rectum                           D50.9, Iron  deficiency anemia, unspecified CPT copyright 2022 American [employer]. All rights reserved. The codes documented in this report are preliminary and upon coder review may  be revised to meet current compliance requirements. Corliss Parish, MD 02/23/2023 12:55:08 PM Number of Addenda: 0

## 2023-02-23 NOTE — Interval H&P Note (Signed)
History and Physical Interval Note:  02/23/2023 9:30 AM  [name]  has presented today for surgery, with the diagnosis of Symptomatic microcytic anemia.  The various methods of treatment have been discussed with the patient and family. After consideration of risks, benefits and other options for treatment, the patient has consented to  Procedure(s): ESOPHAGOGASTRODUODENOSCOPY (EGD) (N/A) COLONOSCOPY (N/A) as a surgical intervention.  The patient's history has been reviewed, patient examined, no change in status, stable for surgery.  I have reviewed the patient's chart and labs.  Questions were answered to the patient's satisfaction.     [employer]

## 2023-02-23 NOTE — Op Note (Signed)
[facility] Patient Name: [name] Procedure Date : 02/23/2023 MRN: [id number] Attending MD: Corliss Parish , MD, [id number] Date of Birth: December 12, 1960 CSN: [id number] Age: 62 Admit Type: Inpatient Procedure:                Upper GI endoscopy Indications:              Iron deficiency anemia Providers:                Corliss Parish, MD, Fransisca Connors, Cephus Richer, RN, Marja Kays, Technician Referring MD:             Inpatient medical service Medicines:                Monitored Anesthesia Care Complications:            No immediate complications. Estimated Blood Loss:     Estimated blood loss was minimal. Procedure:                Pre-Anesthesia Assessment:                           - Prior to the procedure, a History and Physical                            was performed, and patient medications and                            allergies were reviewed. The patient's tolerance of                            previous anesthesia was also reviewed. The risks                            and benefits of the procedure and the sedation                            options and risks were discussed with the patient.                            All questions were answered, and informed consent                            was obtained. Prior Anticoagulants: The patient has                            taken no anticoagulant or antiplatelet agents                            except for aspirin. ASA Grade Assessment: III - A                            patient with severe systemic disease. After  reviewing the risks and benefits, the patient was                            deemed in satisfactory condition to undergo the                            procedure.                           After obtaining informed consent, the endoscope was                            passed under direct vision. Throughout the                             procedure, the patient's blood pressure, pulse, and                            oxygen saturations were monitored continuously. The                            [id number]-[id number] ([id number]) Olympus endoscope was introduced                            through the mouth, and advanced to the second part                            of duodenum. The upper GI endoscopy was                            accomplished without difficulty. The patient                            tolerated the procedure. Scope In: Scope Out: Findings:      No gross lesions were noted in the majority of the esophagus.      Scattered islands of salmon-colored mucosa were present from 42 to 43       cm. No other visible abnormalities were present. Biopsies were taken       with a cold forceps for histology to rule in/out Barrett's.      The Z-line was irregular and was found 43 cm from the incisors.      A 2 cm hiatal hernia was present.      A single small angiodysplastic lesion with typical arborization was       found on the anterior wall of the gastric body. Fulguration to ablate       the lesion to prevent bleeding by argon plasma was successful.      Localized moderate inflammation characterized by erosions, erythema and       linear erosions was found in the gastric body, at the incisura and in       the gastric antrum.      No other gross lesions were noted in the entire examined stomach.       Biopsies were taken with a cold forceps for histology and Helicobacter       pylori testing from the entire  stomach.      Patchy mildly congested mucosa without active bleeding and with no       stigmata of bleeding was found in the duodenal bulb, in the first       portion of the duodenum and in the second portion of the duodenum.       Biopsies were taken with a cold forceps for histology. Impression:               - No gross lesions in the majority of the esophagus.                           - Salmon-colored mucosal islands  suspicious for                            short-segment Barrett's esophagus found distally.                            Biopsied.                           - Z-line irregular, 43 cm from the incisors.                           - 2 cm hiatal hernia.                           - A single typical arborization angiodysplastic                            lesion in the anterior gastric body stomach.                            Treated with argon plasma coagulation (APC).                           - Gastritis noted in the body/incisura/antrum. No                            gross lesions in the entire stomach. Biopsied.                           - Congested duodenal mucosa. Biopsied. Recommendation:           - Proceed to scheduled colonoscopy.                           - Observe patient's clinical course.                           - Await pathology results.                           - Increase Protonix to 40 mg twice daily for 2                            months and then may decrease back to once daily.                           -  Carafate twice daily for 2 weeks.                           - Continue present medications.                           - The findings and recommendations were discussed                            with the patient.                           - The findings and recommendations were discussed                            with the referring physician. Procedure Code(s):        --- Professional ---                           [phone], 59, Esophagogastroduodenoscopy, flexible,                            transoral; with control of bleeding, any method                           43239, Esophagogastroduodenoscopy, flexible,                            transoral; with biopsy, single or multiple Diagnosis Code(s):        --- Professional ---                           K22.89, Other specified disease of esophagus                           K44.9, Diaphragmatic hernia without obstruction or                             gangrene                           K29.70, Gastritis, unspecified, without bleeding                           K31.89, Other diseases of stomach and duodenum                           D50.9, Iron deficiency anemia, unspecified CPT copyright 2022 American Medical Association. All rights reserved. The codes documented in this report are preliminary and upon coder review may  be revised to meet current compliance requirements. Corliss Parish, MD 02/23/2023 12:46:10 PM Number of Addenda: 0

## 2023-02-23 NOTE — Discharge Summary (Signed)
Physician Discharge Summary   Patient: [name] MRN: [id number] DOB: 01/02/1961  Admit date:     02/20/2023  Discharge date: 02/23/23  Discharge Physician: Thad Ranger, MD    PCP: [name], FNP   Recommendations at discharge:    Placed on Protonix 40 mg twice daily for 2 months and then daily Carafate 1 g p.o. twice daily for 2 weeks Hold aspirin for now Started on Coreg 6.25 mg twice daily Hydralazine 50 mg 3 times daily Needs close follow-up with nephrology, Dr. Thedore Mins, further adjustment outpatient. For now telmisartan held due to worsening renal function.  Discharge Diagnoses:    Symptomatic anemia Gastritis with angiodysplastic lesion in the anterior gastric body Uncontrolled hypertension   Hyperlipemia   GERD (gastroesophageal reflux disease)   Anemia   AKI (acute kidney injury) (HCC)   Hyperaldosteronism (HCC)   CKD (chronic kidney disease) stage 3, GFR 30-59 ml/min (HCC) Internal and external hemorrhoids   Microcytic anemia      Hospital Course:  Patient is a 62 year old female with HTN, chronic HFpEF, HLD, GERD, chronic kidney disease stage IIIb was sent from PCPs office for anemia.  Patient reported generalized weakness, malaise 3 days ago, exertional dyspnea no cough, no chest pain.  Denied any abdominal pain, rectal bleeding or dark-colored stools.  She does have chronic hemorrhoids, occasionally has bleeding when wiping.  No weight loss, no change of bowel habits.  Patient went to see her PCP and was found to have low hemoglobin and presented to ED. In ED, BP elevated 170/80, hemoglobin 6.0, baseline hemoglobin 10.  MCV 77, RDW 18, creatinine 2.9, baseline 1.9, CO2 18 Patient was admitted for further workup.  Assessment and Plan:   Symptomatic anemia, acute on chronic, microcytic Gastritis with angiodysplastic lesion status post APC -Unclear etiology, has history of hemorrhoids (occasionally has rectal bleeding), GERD, has been taking NSAIDs  occasionally but no recent melena or hematochezia.  FOBT negative. -Anemia panel showed iron 141, TIBC 396, ferritin 18, folate 19.0, B12 415 -LDH 114, haptoglobin pending -Presented with hemoglobin of 6.0, received 3 units packed RBCs, IV iron on 7/3 -Hemoglobin 7.5 today -EGD showed gastritis angiodysplastic lesion on the anterior wall of the gastric body status post argon plasma ablation, localized moderate inflammation characterized by erosions, erythema and linear erosions in the gastric body and antrum, patchy mildly congested mucosa without active bleeding and with no stigmata of bleeding in duodenum. -Colonoscopy showed normal mucosa in the entire colon, nonbleeding nonthrombosed external and internal hemorrhoids. -GI recommended Protonix 40 mg twice daily for 2 months and then continue daily Carafate 1 g p.o. twice daily for 2 weeks Anusol suppositories, also placed on ferrous gluconate daily. Ambulatory referral sent to hematology outpatient, no need for periodic IV iron if no significant improvement     Acute on CKD (chronic kidney disease) stage 3b, GFR 30-59 ml/min (HCC), metabolic acidosis -Likely worsened due to severe anemia, baseline creatinine 1.9, presented with creatinine of 2.9 -Status post 3 units packed RBCs -Renal ultrasound showed no hydronephrosis or acute findings, medical renal disease -Continue to hold spironolactone, telmisartan -Continue sodium bicarb tabs, creatinine improving, 2.2, outpatient follow-up with nephrology, Dr. Thedore Mins    Accelerated hypertension, underlying history of hyperaldosteronism -Holding telmisartan due to AKI on CKD 3B -Increased Imdur to 60 mg daily, continue amlodipine, Coreg, increased hydralazine to 50 mg 3 times daily, continue Aldactone -Outpatient follow-up with nephrology     Hyperlipemia Continue Lipitor     GERD (gastroesophageal reflux disease) -Continue Protonix  Hemorrhoids -Continue Anusol suppository    Obesity Estimated body mass index is 30.67 kg/m as calculated from the following:   Height as of this encounter: 5\' 8"  (1.727 m).   Weight as of this encounter: 91.5 kg.     Pain control - [employer] Controlled Substance Reporting System database was reviewed. and patient was instructed, not to drive, operate heavy machinery, perform activities at heights, swimming or participation in water activities or provide baby-sitting services while on Pain, Sleep and Anxiety Medications; until their outpatient Physician has advised to do so again. Also recommended to not to take more than prescribed Pain, Sleep and Anxiety Medications.  Consultants: Gastroenterology Procedures performed: EGD, colonoscopy Disposition: Home Diet recommendation:  Discharge Diet Orders (From admission, onward)     Start     Ordered   02/23/23 0000  Diet - low sodium heart healthy        02/23/23 1409            DISCHARGE MEDICATION: Allergies as of 02/23/2023       Reactions   Lisinopril Cough        Medication List     STOP taking these medications    Aspirin Low Dose 81 MG chewable tablet Generic drug: aspirin   telmisartan 80 MG tablet Commonly known as: MICARDIS       TAKE these medications    acetaminophen 325 MG tablet Commonly known as: TYLENOL Take 650 mg by mouth every 6 (six) hours as needed for mild pain or headache.   amLODipine 10 MG tablet Commonly known as: NORVASC Take 1 tablet (10 mg total) by mouth daily.   atorvastatin 40 MG tablet Commonly known as: LIPITOR Take 1 tablet (40 mg total) by mouth daily.   carvedilol 6.25 MG tablet Commonly known as: COREG Take 1 tablet (6.25 mg total) by mouth 2 (two) times daily with a meal.   Farxiga 10 MG Tabs tablet Generic drug: dapagliflozin propanediol Take 1 tablet (10 mg total) by mouth daily.   ferrous gluconate 324 MG tablet Commonly known as: FERGON Take 1 tablet (324 mg total) by mouth daily with  breakfast.   gabapentin 100 MG capsule Commonly known as: NEURONTIN Take 2 capsules (200 mg total) by mouth 2 (two) times daily.   hydrALAZINE 50 MG tablet Commonly known as: APRESOLINE Take 1 tablet (50 mg total) by mouth 3 (three) times daily.   hydrocortisone 25 MG suppository Commonly known as: ANUSOL-HC Place 1 suppository (25 mg total) rectally every 12 (twelve) hours.   isosorbide mononitrate 60 MG 24 hr tablet Commonly known as: IMDUR Take 1 tablet (60 mg total) by mouth daily. Start taking on: February 24, 2023   ondansetron 4 MG tablet Commonly known as: Zofran Take 1 tablet (4 mg total) by mouth every 8 (eight) hours as needed for nausea or vomiting. What changed: when to take this   pantoprazole 40 MG tablet Commonly known as: PROTONIX Take 1 tablet (40 mg total) by mouth 2 (two) times daily before a meal. Twice a day X 2 months, then continue daily   sodium bicarbonate 325 MG tablet Take 1 tablet (325 mg total) by mouth 2 (two) times daily for 7 days.   spironolactone 25 MG tablet Commonly known as: ALDACTONE Take 1 tablet (25 mg total) by mouth daily.   sucralfate 1 g tablet Commonly known as: CARAFATE Take 1 tablet (1 g total) by mouth 2 (two) times daily for 14 days.  Follow-up Information     Anthony Sar, MD. Schedule an appointment as soon as possible for a visit in 2 week(s).   Specialty: Nephrology Why: for hospital follow-up Contact information: [address] [place] [address] [phone]         [name], FNP. Schedule an appointment as soon as possible for a visit in 2 week(s).   Specialty: General Practice Contact information: [address] [place] [address] [phone]         Mansouraty, Netty Starring., MD. Schedule an appointment as soon as possible for a visit in 4 week(s).   Specialties: Gastroenterology, Internal Medicine Why: for hospital follow-up Contact information: [address]  [place] [address] [phone]                Discharge Exam: Ceasar Mons Weights   02/20/23 1047 02/23/23 1024  Weight: 91.5 kg 91.5 kg   S: No nausea or vomiting, wants to go home after endoscopy.  BP (!) 151/73   Pulse 76   Temp 98.6 F (37 C) (Tympanic)   Resp 18   Ht 5\' 8"  (1.727 m)   Wt 91.5 kg   SpO2 97%   BMI 30.67 kg/m   Physical Exam General: Alert and oriented x 3, NAD Cardiovascular: S1 S2 clear, RRR.  Respiratory: CTAB, no wheezing, rales or rhonchi Gastrointestinal: Soft, nontender, nondistended, NBS Ext: no pedal edema bilaterally Neuro: no new deficits Psych: Normal affect     Condition at discharge: fair  The results of significant diagnostics from this hospitalization (including imaging, microbiology, ancillary and laboratory) are listed below for reference.   Imaging Studies: US Abdomen Complete  Result Date: 02/21/2023 CLINICAL DATA:  Evaluate liver and spleen. EXAM: ABDOMEN ULTRASOUND COMPLETE COMPARISON:  02/05/2017 FINDINGS: Gallbladder: No gallstones or wall thickening visualized. The gallbladder is contracted. No sonographic Murphy sign noted by sonographer. Common bile duct: Diameter: 4.0 mm Liver: A focal hypoechoic structure is seen along the posterior aspect of the right lobe of the liver measuring 4.2 x 2.9 x 4.0 cm, which corresponds with known right adrenal nodule. Within normal limits in parenchymal echogenicity. Portal vein is patent on color Doppler imaging with normal direction of blood flow towards the liver. IVC: No abnormality visualized. Pancreas: Visualized portion unremarkable. Spleen: Size and appearance within normal limits. Right Kidney: Length: 10.2 cm. Echogenicity within normal limits. No mass or hydronephrosis visualized. Left Kidney: Length: 9.1 cm. Echogenicity within normal limits. No mass or hydronephrosis visualized. Abdominal aorta: No aneurysm visualized. Other findings: No free fluid. IMPRESSION: 1. Normal  evaluation of the liver and spleen. 2. Hypoechoic lesion along along the posterior aspect of the liver measuring 4.2 x 2.9 x 4.0 cm versus 3.7 x 3.1 cm in 2018, previously characterized as functional adrenal adenoma. Given enlargement over time, surgical consultation is recommended. Electronically Signed   By: Thornell Sartorius M.D.   On: 02/21/2023 22:23   DG Abd 1 View  Result Date: 02/20/2023 CLINICAL DATA:  abdomen pain, abdomen distended. EXAM: ABDOMEN - 1 VIEW COMPARISON:  09/05/2018 FINDINGS: Stomach and small bowel decompressed. There is mild gaseous distention of the descending colon, decompressed distally. Stable left pelvic phleboliths. Regional bones unremarkable. IMPRESSION: Mild gaseous distention of the descending colon. Electronically Signed   By: Corlis Leak M.D.   On: 02/20/2023 15:25   US RENAL  Result Date: 02/20/2023 CLINICAL DATA:  [id number] AKI (acute kidney injury) (HCC) [id number] EXAM: RENAL / URINARY TRACT ULTRASOUND COMPLETE COMPARISON:  04/19/2021 FINDINGS:  Right Kidney: Renal measurements: 11.6 x 4.9 x 5.6 cm = volume: 165 mL. Parenchyma is slightly hyperechoic compared to adjacent liver. 1.1 cm cyst in the upper pole. No hydronephrosis. Left Kidney: Renal measurements: 10.7 x 5.7 x 5 cm = volume: 159 mL. Echogenicity within normal limits. No mass or hydronephrosis visualized. Bladder: Appears normal for degree of bladder distention. Ureteral jets not demonstrated. Other: Technologist describes technically difficult study secondary to bowel gas and body habitus. IMPRESSION: No hydronephrosis or other acute finding. Echogenic renal parenchyma, a nonspecific finding seen in medical renal disease. Electronically Signed   By: Corlis Leak M.D.   On: 02/20/2023 15:23    Microbiology: Results for orders placed or performed during the hospital encounter of 12/04/20  Resp Panel by RT-PCR (Flu A&B, Covid) Nasopharyngeal Swab     Status: None   Collection Time: 12/04/20  1:41 PM   Specimen:  Nasopharyngeal Swab; Nasopharyngeal(NP) swabs in vial transport medium  Result Value Ref Range Status   SARS Coronavirus 2 by RT PCR NEGATIVE NEGATIVE Final    Comment: (NOTE) SARS-CoV-2 target nucleic acids are NOT DETECTED.  The SARS-CoV-2 RNA is generally detectable in upper respiratory specimens during the acute phase of infection. The lowest concentration of SARS-CoV-2 viral copies this assay can detect is 138 copies/mL. A negative result does not preclude SARS-Cov-2 infection and should not be used as the sole basis for treatment or other patient management decisions. A negative result may occur with  improper specimen collection/handling, submission of specimen other than nasopharyngeal swab, presence of viral mutation(s) within the areas targeted by this assay, and inadequate number of viral copies(<138 copies/mL). A negative result must be combined with clinical observations, patient history, and epidemiological information. The expected result is Negative.  Fact Sheet for Patients:  BloggerCourse.com  Fact Sheet for Healthcare Providers:  SeriousBroker.it  This test is no t yet approved or cleared by the [place] FDA and  has been authorized for detection and/or diagnosis of SARS-CoV-2 by FDA under an Emergency Use Authorization (EUA). This EUA will remain  in effect (meaning this test can be used) for the duration of the COVID-19 declaration under Section 564(b)(1) of the Act, 21 U.S.C.section 360bbb-3(b)(1), unless the authorization is terminated  or revoked sooner.       Influenza A by PCR NEGATIVE NEGATIVE Final   Influenza B by PCR NEGATIVE NEGATIVE Final    Comment: (NOTE) The Xpert Xpress SARS-CoV-2/FLU/RSV plus assay is intended as an aid in the diagnosis of influenza from Nasopharyngeal swab specimens and should not be used as a sole basis for treatment. Nasal washings and aspirates are unacceptable for  Xpert Xpress SARS-CoV-2/FLU/RSV testing.  Fact Sheet for Patients: BloggerCourse.com  Fact Sheet for Healthcare Providers: SeriousBroker.it  This test is not yet approved or cleared by the [place] FDA and has been authorized for detection and/or diagnosis of SARS-CoV-2 by FDA under an Emergency Use Authorization (EUA). This EUA will remain in effect (meaning this test can be used) for the duration of the COVID-19 declaration under Section 564(b)(1) of the Act, 21 U.S.C. section 360bbb-3(b)(1), unless the authorization is terminated or revoked.  Performed at [facility], [address] [place], [place] [address]     Labs: CBC: Recent Labs  Lab 02/19/23 1049 02/20/23 1104 02/20/23 1104 02/20/23 1109 02/21/23 0042 02/21/23 0657 02/22/23 0039 02/23/23 0349  WBC 7.7 8.1  --   --  7.5  --  9.3 8.0  HGB 6.0* 6.0*   < >  7.5* 6.8* 8.0* 8.1* 7.5*  HCT 22.0* 23.0*   < > 22.0* 23.8* 27.4* 27.2* 25.9*  MCV 76* 77.7*  --   --  78.8*  --  77.3* 79.9*  PLT 431 421*  --   --  367  --  371 324   < > = values in this interval not displayed.   Basic Metabolic Panel: Recent Labs  Lab 02/19/23 1049 02/20/23 1104 02/20/23 1109 02/21/23 0027 02/22/23 0039 02/23/23 0349  NA 142 139 143 140 139 141  K 4.4 4.5 4.5 4.2 4.7 4.3  CL 109* 110 113* 112* 110 110  CO2 19* 18*  --  20* 21* 21*  GLUCOSE 88 93 93 88 94 88  BUN 33* 29* 30* 29* 34* 29*  CREATININE 2.82* 2.66* 2.90* 2.36* 2.54* 2.23*  CALCIUM 9.3 8.7*  --  8.3* 8.7* 8.4*   Liver Function Tests: Recent Labs  Lab 02/19/23 1049 02/20/23 1104  AST 11 16  ALT 7 11  ALKPHOS 75 55  BILITOT 0.2 0.6  PROT 7.5 7.7  ALBUMIN 3.9 3.3*   CBG: No results for input(s): "GLUCAP" in the last 168 hours.  Discharge time spent: greater than 30 minutes.  Signed: Thad Ranger, MD [facility] 02/23/2023

## 2023-02-23 NOTE — Progress Notes (Signed)
[facility]                                                                              [name], is a 62 y.o. female, DOB - 1961/05/13, [name]:[id number] Admit date - 02/20/2023    Outpatient Primary MD for the patient is [name], FNP  LOS - 0  days  Chief Complaint  Patient presents with   Abnormal Lab       Brief summary   Patient is a 62 year old female with HTN, chronic HFpEF, HLD, GERD, chronic kidney disease stage IIIb was sent from PCPs office for anemia.  Patient reported generalized weakness, malaise 3 days ago, exertional dyspnea no cough, no chest pain.  Denied any abdominal pain, rectal bleeding or dark-colored stools.  She does have chronic hemorrhoids, occasionally has bleeding when wiping.  No weight loss, no change of bowel habits.  Patient went to see her PCP and was found to have low hemoglobin and presented to ED. In ED, BP elevated 170/80, hemoglobin 6.0, baseline hemoglobin 10.  MCV 77, RDW 18, creatinine 2.9, baseline 1.9, CO2 18    Assessment & Plan    Principal Problem:   Symptomatic anemia, acute on chronic, microcytic -Unclear etiology, has history of hemorrhoids (occasionally has rectal bleeding), GERD, has been taking NSAIDs occasionally but no recent melena or hematochezia.  FOBT negative. -Anemia panel showed iron 141, TIBC 396, ferritin 18, folate 19.0, B12 415 -LDH 114, haptoglobin pending -Presented with hemoglobin of 6.0, received 3 units packed RBCs, IV iron on 7/3 -Overnight no acute bleeding, hemoglobin 7.5, plan for EGD and colonoscopy today -Per patient, if EGD, colonoscopy negative, she would like to go home and follow-up with hematology outpatient -Will await EGD, C-scope today  Active Problems: Acute on CKD (chronic kidney disease) stage 3b, GFR 30-59 ml/min (HCC), metabolic acidosis -Likely worsened due to severe anemia, baseline creatinine 1.9, presented with creatinine of 2.9 -Status post 3 units packed  RBCs -Renal ultrasound showed no hydronephrosis or acute findings, medical renal disease -Continue to hold spironolactone, telmisartan -Continue sodium bicarb tabs, creatinine improving, 2.2, outpatient follow-up with nephrology, Dr. Thedore Mins   Accelerated hypertension, underlying history of hyperaldosteronism -Holding spironolactone, telmisartan due to AKI on CKD 3B -Increased Imdur to 60 mg daily, continue amlodipine, Coreg, increased hydralazine to 50 mg 3 times daily -Labetalol IV as needed with parameters    Hyperlipemia Continue Lipitor    GERD (gastroesophageal reflux disease) -Continue Protonix 40 mg daily  Hemorrhoids -Continue Anusol suppository  Obesity Estimated body mass index is 30.67 kg/m as calculated from the following:   Height as of this encounter: 5\' 8"  (1.727 m).   Weight as of this encounter: 91.5 kg.  Code Status: Full code DVT Prophylaxis:  SCDs Start: 02/20/23 1331   Level of Care: Level of care: Med-Surg Family Communication: Updated patient Disposition Plan:      Remains inpatient appropriate: EGD, colonoscopy today, pending results will plan further management   Procedures:    Consultants:   GI  Medications  [MAR Hold] amLODipine  10 mg Oral Daily   [MAR Hold] atorvastatin  40 mg Oral Daily   [  MAR Hold] carvedilol  6.25 mg Oral BID WC   [MAR Hold] gabapentin  200 mg Oral BID   [MAR Hold] hydrALAZINE  50 mg Oral Q8H   [MAR Hold] hydrocortisone  25 mg Rectal BID   [MAR Hold] isosorbide mononitrate  60 mg Oral Daily   [MAR Hold] linagliptin  5 mg Oral Daily   [MAR Hold] pantoprazole  40 mg Oral Q0600   [MAR Hold] sodium bicarbonate  650 mg Oral BID      Subjective:   [name] was seen and examined today.  BP overnight elevated, no chest pain, shortness of breath, blurry vision, headache.  Awaiting endoscopy today.  No overnight bleeding.  Objective:   Vitals:   02/23/23 0456 02/23/23 0628 02/23/23 0719 02/23/23 1024  BP: (!)  189/84 (!) 168/85 (!) 167/85 (!) 168/82  Pulse: 74 72 71 70  Resp: 18  16 17   Temp: 99.2 F (37.3 C)  98.9 F (37.2 C) 98.6 F (37 C)  TempSrc: Oral  Oral Oral  SpO2: 96%  100% 99%  Weight:    91.5 kg  Height:    5\' 8"  (1.727 m)    Intake/Output Summary (Last 24 hours) at 02/23/2023 1136 Last data filed at 02/23/2023 0522 Gross per 24 hour  Intake 1590 ml  Output --  Net 1590 ml      Wt Readings from Last 3 Encounters:  02/23/23 91.5 kg  02/19/23 91.5 kg  10/04/22 95.3 kg   Physical Exam General: Alert and oriented x 3, NAD Cardiovascular: S1 S2 clear, RRR.  Respiratory: CTAB, no wheezing Gastrointestinal: Soft, nontender, nondistended, NBS Ext: no pedal edema bilaterally Neuro: no new deficits Psych: Normal affect   Data Reviewed:  I have personally reviewed following labs    CBC Lab Results  Component Value Date   WBC 8.0 02/23/2023   RBC 3.24 (L) 02/23/2023   HGB 7.5 (L) 02/23/2023   HCT 25.9 (L) 02/23/2023   MCV 79.9 (L) 02/23/2023   MCH 23.1 (L) 02/23/2023   PLT 324 02/23/2023   MCHC 29.0 (L) 02/23/2023   RDW 19.1 (H) 02/23/2023   LYMPHSABS 1.1 04/08/2022   MONOABS 0.6 04/08/2022   EOSABS 0.0 04/08/2022   BASOSABS 0.0 04/08/2022     Last metabolic panel Lab Results  Component Value Date   NA 141 02/23/2023   K 4.3 02/23/2023   CL 110 02/23/2023   CO2 21 (L) 02/23/2023   BUN 29 (H) 02/23/2023   CREATININE 2.23 (H) 02/23/2023   GLUCOSE 88 02/23/2023   GFRNONAA 24 (L) 02/23/2023   GFRAA 48 (L) 10/11/2020   CALCIUM 8.4 (L) 02/23/2023   PHOS 4.0 11/16/2020   PROT 7.7 02/20/2023   ALBUMIN 3.3 (L) 02/20/2023   LABGLOB 3.6 02/19/2023   AGRATIO 1.3 08/23/2021   BILITOT 0.6 02/20/2023   ALKPHOS 55 02/20/2023   AST 16 02/20/2023   ALT 11 02/20/2023   ANIONGAP 10 02/23/2023    CBG (last 3)  No results for input(s): "GLUCAP" in the last 72 hours.    Coagulation Profile: No results for input(s): "INR", "PROTIME" in the last 168  hours.   Radiology Studies: I have personally reviewed the imaging studies  US Abdomen Complete  Result Date: 02/21/2023 CLINICAL DATA:  Evaluate liver and spleen. EXAM: ABDOMEN ULTRASOUND COMPLETE COMPARISON:  02/05/2017 FINDINGS: Gallbladder: No gallstones or wall thickening visualized. The gallbladder is contracted. No sonographic Murphy sign noted by sonographer. Common bile duct: Diameter: 4.0 mm Liver: A focal hypoechoic  structure is seen along the posterior aspect of the right lobe of the liver measuring 4.2 x 2.9 x 4.0 cm, which corresponds with known right adrenal nodule. Within normal limits in parenchymal echogenicity. Portal vein is patent on color Doppler imaging with normal direction of blood flow towards the liver. IVC: No abnormality visualized. Pancreas: Visualized portion unremarkable. Spleen: Size and appearance within normal limits. Right Kidney: Length: 10.2 cm. Echogenicity within normal limits. No mass or hydronephrosis visualized. Left Kidney: Length: 9.1 cm. Echogenicity within normal limits. No mass or hydronephrosis visualized. Abdominal aorta: No aneurysm visualized. Other findings: No free fluid. IMPRESSION: 1. Normal evaluation of the liver and spleen. 2. Hypoechoic lesion along along the posterior aspect of the liver measuring 4.2 x 2.9 x 4.0 cm versus 3.7 x 3.1 cm in 2018, previously characterized as functional adrenal adenoma. Given enlargement over time, surgical consultation is recommended. Electronically Signed   By: Thornell Sartorius M.D.   On: 02/21/2023 22:23       Marleigh Kaylor M.D. [facility] 02/23/2023, 11:36 AM  Available via Epic secure chat 7am-7pm After 7 pm, please refer to night coverage provider listed on amion.

## 2023-02-23 NOTE — Progress Notes (Signed)
Patient have finished her bowel prep at 237 am. Patient was educated to be NPO until after her schedule procedure.    [name]

## 2023-02-23 NOTE — Progress Notes (Signed)
BP recheck post labetalol administration was 168/85. [name], DO was notified.  [name]

## 2023-02-23 NOTE — Progress Notes (Signed)
[name], DO was notified that patient has had 3 doses of her PRN labetolol during this nurses shift.  [name]

## 2023-02-23 NOTE — Anesthesia Postprocedure Evaluation (Signed)
Anesthesia Post Note  Patient: [name]  Procedure(s) Performed: ESOPHAGOGASTRODUODENOSCOPY (EGD) COLONOSCOPY BIOPSY HOT HEMOSTASIS (ARGON PLASMA COAGULATION/BICAP) POLYPECTOMY     Patient location during evaluation: PACU Anesthesia Type: MAC Level of consciousness: awake Pain management: pain level controlled Vital Signs Assessment: post-procedure vital signs reviewed and stable Respiratory status: spontaneous breathing, nonlabored ventilation and respiratory function stable Cardiovascular status: stable and blood pressure returned to baseline Postop Assessment: no apparent nausea or vomiting Anesthetic complications: no   No notable events documented.  Last Vitals:  Vitals:   02/23/23 1250 02/23/23 1300  BP: (!) 150/71 (!) 151/73  Pulse: 77 76  Resp: 16 18  Temp:    SpO2: 98% 97%    Last Pain:  Vitals:   02/23/23 1300  TempSrc:   PainSc: 0-No pain                 Linton Rump

## 2023-02-23 NOTE — Transfer of Care (Signed)
Immediate Anesthesia Transfer of Care Note  Patient: [name]  Procedure(s) Performed: ESOPHAGOGASTRODUODENOSCOPY (EGD) COLONOSCOPY BIOPSY HOT HEMOSTASIS (ARGON PLASMA COAGULATION/BICAP) POLYPECTOMY  Patient Location: Endoscopy Unit  Anesthesia Type:MAC  Level of Consciousness: awake, alert , and oriented  Airway & Oxygen Therapy: Patient Spontanous Breathing  Post-op Assessment: Report given to RN and Post -op Vital signs reviewed and stable  Post vital signs: Reviewed and stable  Last Vitals:  Vitals Value Taken Time  BP 152/75 02/23/23 1244  Temp    Pulse 77 02/23/23 1245  Resp 20 02/23/23 1245  SpO2 98 % 02/23/23 1245  Vitals shown include unvalidated device data.  Last Pain:  Vitals:   02/23/23 1024  TempSrc: Oral  PainSc: 0-No pain         Complications: No notable events documented.

## 2023-02-23 NOTE — Plan of Care (Signed)
Patient alert/oriented X4. Patient compliant with medication administration and complained of no pain following colonoscopy. Patient PIV removed prior to discharge and AVS discharge instructions explained in detail. Patient personal belongings packed up at bedside. VSS upon discharge, pending wheelchair transport.   Problem: Education: Goal: Knowledge of General Education information will improve Description: Including pain rating scale, medication(s)/side effects and non-pharmacologic comfort measures Outcome: Adequate for Discharge   Problem: Health Behavior/Discharge Planning: Goal: Ability to manage health-related needs will improve Outcome: Adequate for Discharge   Problem: Clinical Measurements: Goal: Ability to maintain clinical measurements within normal limits will improve Outcome: Adequate for Discharge   Problem: Clinical Measurements: Goal: Will remain free from infection Outcome: Adequate for Discharge   Problem: Clinical Measurements: Goal: Diagnostic test results will improve Outcome: Adequate for Discharge   Problem: Clinical Measurements: Goal: Respiratory complications will improve Outcome: Adequate for Discharge   Problem: Clinical Measurements: Goal: Cardiovascular complication will be avoided Outcome: Adequate for Discharge   Problem: Activity: Goal: Risk for activity intolerance will decrease Outcome: Adequate for Discharge   Problem: Nutrition: Goal: Adequate nutrition will be maintained Outcome: Adequate for Discharge   Problem: Coping: Goal: Level of anxiety will decrease Outcome: Adequate for Discharge   Problem: Elimination: Goal: Will not experience complications related to bowel motility Outcome: Adequate for Discharge   Problem: Elimination: Goal: Will not experience complications related to urinary retention Outcome: Adequate for Discharge   Problem: Pain Managment: Goal: General experience of comfort will improve Outcome: Adequate  for Discharge   Problem: Safety: Goal: Ability to remain free from injury will improve Outcome: Adequate for Discharge   Problem: Skin Integrity: Goal: Risk for impaired skin integrity will decrease Outcome: Adequate for Discharge

## 2023-02-26 ENCOUNTER — Telehealth: Payer: Self-pay

## 2023-02-26 NOTE — Transitions of Care (Post Inpatient/ED Visit) (Signed)
   02/26/2023  Name: Liliany Withington MRN: [id number] DOB: 1961/08/03  Today's TOC FU Call Status: Today's TOC FU Call Status:: Unsuccessul Call (1st Attempt) Unsuccessful Call (1st Attempt) Date: 02/26/23  Attempted to reach the patient regarding the most recent Inpatient/ED visit.  Follow Up Plan: Additional outreach attempts will be made to reach the patient to complete the Transitions of Care (Post Inpatient/ED visit) call.      Antionette Fairy, RN,BSN,CCM [facility] Care Management Care Management Community Coordinator Direct Phone: [phone] Toll Free: [phone] Fax: [phone]

## 2023-02-27 ENCOUNTER — Encounter: Payer: Self-pay | Admitting: Gastroenterology

## 2023-02-27 ENCOUNTER — Telehealth: Payer: Self-pay

## 2023-02-27 ENCOUNTER — Encounter (HOSPITAL_COMMUNITY): Payer: Self-pay | Admitting: Gastroenterology

## 2023-02-27 LAB — SURGICAL PATHOLOGY

## 2023-02-27 NOTE — Transitions of Care (Post Inpatient/ED Visit) (Signed)
02/27/2023  Name: [name] MRN: [id number] DOB: 10/11/1960  Today's TOC FU Call Status: Today's TOC FU Call Status:: Successful TOC FU Call Competed TOC FU Call Complete Date: 02/27/23  Transition Care Management Follow-up Telephone Call Date of Discharge: 02/23/23 Discharge Facility: [facility]) Type of Discharge: Inpatient Admission Primary Inpatient Discharge Diagnosis:: "symptomatic anemia" How have you been since you were released from the hospital?: Better (Pt states she is doing ok-occassional headache-relieved with Tylenol. Appetite good. LBM today-normal.) Any questions or concerns?: No  Items Reviewed: Did you receive and understand the discharge instructions provided?: Yes Medications obtained,verified, and reconciled?: Yes (Medications Reviewed) Any new allergies since your discharge?: No Dietary orders reviewed?: Yes Type of Diet Ordered:: low salt/heart healthy Do you have support at home?: Yes People in Home: sibling(s) Name of Support/Comfort Primary Source: sister helps her out  Medications Reviewed Today: Medications Reviewed Today     Reviewed by Charlyn Minerva, RN (Registered Nurse) on 02/27/23 at [id number]  Med List Status: <None>   Medication Order Taking? Sig Documenting Provider Last Dose Status Informant  acetaminophen (TYLENOL) 325 MG tablet [id number] Yes Take 650 mg by mouth every 6 (six) hours as needed for mild pain or headache. [provider] Taking Active Self, Pharmacy Records  amLODipine (NORVASC) 10 MG tablet [id number] Yes Take 1 tablet (10 mg total) by mouth daily. [name], FNP Taking Active Self, Pharmacy Records  atorvastatin (LIPITOR) 40 MG tablet [id number] Yes Take 1 tablet (40 mg total) by mouth daily. [name], FNP Taking Active Self, Pharmacy Records  carvedilol (COREG) 6.25 MG tablet [id number] Yes Take 1 tablet (6.25 mg total) by mouth 2 (two) times daily with a meal. Rai, Ripudeep K, MD Taking  Active   FARXIGA 10 MG TABS tablet [id number] Yes Take 1 tablet (10 mg total) by mouth daily. [name], FNP Taking Active Self, Pharmacy Records  ferrous gluconate [facility]) 324 MG tablet [id number] Yes Take 1 tablet (324 mg total) by mouth daily with breakfast. Rai, Delene Ruffini, MD Taking Active   gabapentin (NEURONTIN) 100 MG capsule [id number] Yes Take 2 capsules (200 mg total) by mouth 2 (two) times daily. [name], FNP Taking Active Self, Pharmacy Records           Med Note Slayden, CATHERINE T   Tue Nov 21, 2022 11:33 AM)    hydrALAZINE (APRESOLINE) 50 MG tablet [id number] Yes Take 1 tablet (50 mg total) by mouth 3 (three) times daily. Rai, Delene Ruffini, MD Taking Active   hydrocortisone (ANUSOL-HC) 25 MG suppository [id number] Yes Place 1 suppository (25 mg total) rectally every 12 (twelve) hours. Rai, Delene Ruffini, MD Taking Active   isosorbide mononitrate (IMDUR) 60 MG 24 hr tablet [id number] Yes Take 1 tablet (60 mg total) by mouth daily. Rai, Delene Ruffini, MD Taking Active   ondansetron (ZOFRAN) 4 MG tablet [id number] Yes Take 1 tablet (4 mg total) by mouth every 8 (eight) hours as needed for nausea or vomiting. Rai, Delene Ruffini, MD Taking Active   pantoprazole (PROTONIX) 40 MG tablet [id number] Yes Take 1 tablet (40 mg total) by mouth 2 (two) times daily before a meal. Twice a day X 2 months, then continue daily Rai, Ripudeep K, MD Taking Active   polyethylene glycol (MIRALAX / GLYCOLAX) 17 g packet [id number] Yes Take 17 g by mouth daily. [provider] Taking Active Self  sodium bicarbonate 325 MG tablet [id number] Yes Take 1 tablet (325 mg total) by mouth 2 (two) times daily  for 7 days. Rai, Delene Ruffini, MD Taking Active   spironolactone (ALDACTONE) 25 MG tablet [id number] Yes Take 1 tablet (25 mg total) by mouth daily. [name], FNP Taking Active Self, Pharmacy Records  sucralfate (CARAFATE) 1 g tablet [id number] Yes Take 1 tablet (1 g total) by mouth 2 (two) times daily for 14 days.  Cathren Harsh, MD Taking Active             Home Care and Equipment/Supplies: Were Home Health Services Ordered?: NA Any new equipment or medical supplies ordered?: NA  Functional Questionnaire: Do you need assistance with bathing/showering or dressing?: No Do you need assistance with meal preparation?: No Do you need assistance with eating?: No Do you have difficulty maintaining continence: No Do you need assistance with getting out of bed/getting out of a chair/moving?: No Do you have difficulty managing or taking your medications?: No  Follow up appointments reviewed: PCP Follow-up appointment confirmed?: Yes Date of PCP follow-up appointment?: 03/07/23 Follow-up Provider: Ellender Hose Specialist [facility] Follow-up appointment confirmed?: Yes Date of Specialist follow-up appointment?: 03/09/23 Follow-Up Specialty Provider:: D. Thayil(heme), pt will call renal and GI MD to make follow up appts Do you need transportation to your follow-up appointment?: No (pt confirms that her sister takes her to appts) Do you understand care options if your condition(s) worsen?: Yes-patient verbalized understanding  SDOH Interventions Today    Flowsheet Row Most Recent Value  SDOH Interventions   Food Insecurity Interventions Intervention Not Indicated  Transportation Interventions Intervention Not Indicated      TOC Interventions Today    Flowsheet Row Most Recent Value  TOC Interventions   TOC Interventions Discussed/Reviewed TOC Interventions Discussed, Arranged PCP follow up less than 12 days/Care Guide scheduled      Interventions Today    Flowsheet Row Most Recent Value  Chronic Disease   Chronic disease during today's visit Hypertension (HTN)  General Interventions   General Interventions Discussed/Reviewed General Interventions Discussed, Doctor Visits, Durable Medical Equipment (DME)  Doctor Visits Discussed/Reviewed Doctor Visits Discussed, Specialist, PCP  Durable  Medical Equipment (DME) BP Cuff  [pt encouraged to obtain BP machine and begin monitoring BP daily & recording log to take to MD appts]  PCP/Specialist Visits Compliance with follow-up visit  Education Interventions   Education Provided Provided Education  Provided Verbal Education On Nutrition, When to see the doctor, Medication, Other  [sx mgmt]  Nutrition Interventions   Nutrition Discussed/Reviewed Nutrition Discussed, Adding fruits and vegetables, Increasing proteins, Decreasing fats, Decreasing salt  Pharmacy Interventions   Pharmacy Dicussed/Reviewed Pharmacy Topics Discussed, Medications and their functions  Safety Interventions   Safety Discussed/Reviewed Safety Discussed       Alessandra Grout [facility] Care Management Care Management Community Coordinator Direct Phone: [phone] Toll Free: [phone] Fax: [phone]

## 2023-02-28 ENCOUNTER — Other Ambulatory Visit: Payer: Self-pay | Admitting: *Deleted

## 2023-02-28 ENCOUNTER — Other Ambulatory Visit: Payer: Self-pay

## 2023-02-28 MED ORDER — BISMUTH/METRONIDAZ/TETRACYCLIN 140-125-125 MG PO CAPS
3.0000 | ORAL_CAPSULE | Freq: Three times a day (TID) | ORAL | 0 refills | Status: DC
  Filled 2023-02-28: qty 120, 10d supply, fill #0

## 2023-03-06 NOTE — Assessment & Plan Note (Signed)
Will check her iron levels and encouraged to avoid eating corn starch

## 2023-03-06 NOTE — Assessment & Plan Note (Addendum)
She is to return to cardiology, I have placed a new order

## 2023-03-06 NOTE — Assessment & Plan Note (Signed)
Generalized distention and has tenderness throughout. Likely related to constipation. Will order KUB. Advised to take a stool softner

## 2023-03-06 NOTE — Assessment & Plan Note (Addendum)
Stable, continue f/u with vascular. She is to wear support socks during the day especially when walking long distances

## 2023-03-06 NOTE — Assessment & Plan Note (Signed)
 Smoking cessation instruction/counseling given:  counseled patient on the dangers of tobacco use, advised patient to stop smoking, and reviewed strategies to maximize success 

## 2023-03-06 NOTE — Assessment & Plan Note (Signed)
Continue statin, tolerating well 

## 2023-03-06 NOTE — Assessment & Plan Note (Signed)
May be related to constipation, will provide Zofran as needed.

## 2023-03-06 NOTE — Assessment & Plan Note (Addendum)
Blood pressure is slightly elevated, encouraged to take medications as directed. EKG done with findings of Atrial Rhythm P:QRS - 1:1, Abnormal P axis, H Rate 72  Voltage criteria for LVH (S(V1)+R(V6) exceeds 3.50 mV).  -Nonspecific T-abnormality.  HR 72

## 2023-03-07 ENCOUNTER — Ambulatory Visit (INDEPENDENT_AMBULATORY_CARE_PROVIDER_SITE_OTHER): Payer: 59 | Admitting: Family Medicine

## 2023-03-07 ENCOUNTER — Ambulatory Visit (INDEPENDENT_AMBULATORY_CARE_PROVIDER_SITE_OTHER): Payer: 59 | Admitting: *Deleted

## 2023-03-07 ENCOUNTER — Other Ambulatory Visit: Payer: Self-pay

## 2023-03-07 ENCOUNTER — Other Ambulatory Visit (HOSPITAL_COMMUNITY): Payer: Self-pay

## 2023-03-07 ENCOUNTER — Telehealth: Payer: 59

## 2023-03-07 ENCOUNTER — Encounter: Payer: Self-pay | Admitting: Family Medicine

## 2023-03-07 VITALS — BP 140/74 | HR 79 | Temp 98.1°F | Ht 68.0 in | Wt 201.0 lb

## 2023-03-07 DIAGNOSIS — D649 Anemia, unspecified: Secondary | ICD-10-CM | POA: Diagnosis not present

## 2023-03-07 DIAGNOSIS — I152 Hypertension secondary to endocrine disorders: Secondary | ICD-10-CM

## 2023-03-07 DIAGNOSIS — F17200 Nicotine dependence, unspecified, uncomplicated: Secondary | ICD-10-CM

## 2023-03-07 DIAGNOSIS — E782 Mixed hyperlipidemia: Secondary | ICD-10-CM

## 2023-03-07 DIAGNOSIS — R011 Cardiac murmur, unspecified: Secondary | ICD-10-CM

## 2023-03-07 MED ORDER — AMLODIPINE BESYLATE 10 MG PO TABS
10.0000 mg | ORAL_TABLET | Freq: Every day | ORAL | 1 refills | Status: DC
Start: 2023-03-07 — End: 2023-12-04

## 2023-03-07 MED ORDER — FARXIGA 10 MG PO TABS: 10.0000 mg | ORAL_TABLET | Freq: Every day | ORAL | 1 refills | Status: DC

## 2023-03-07 MED ORDER — FARXIGA 10 MG PO TABS
10.0000 mg | ORAL_TABLET | Freq: Every day | ORAL | 1 refills | Status: DC
  Filled 2023-03-07: qty 90, 90d supply, fill #0

## 2023-03-07 MED ORDER — AMLODIPINE BESYLATE 10 MG PO TABS
10.0000 mg | ORAL_TABLET | Freq: Every day | ORAL | 1 refills | Status: DC
  Filled 2023-03-07: qty 90, 90d supply, fill #0

## 2023-03-07 NOTE — Chronic Care Management (AMB) (Signed)
Chronic Care Management   CCM RN Visit Note  03/07/2023 Name: [name] MRN: [id number] DOB: 27-Aug-1960  Subjective: [name] is a 62 y.o. year old female who is a primary care patient of [name], FNP. The patient was referred to the Chronic Care Management team for assistance with care management needs subsequent to provider initiation of CCM services and plan of care.    Today's Visit:  Engaged with patient by telephone for follow up visit.        Goals Addressed             This Visit's Progress    CCM (HYPERLIPIDEMIA) EXPECTED OUTCOME: MONITOR, SELF-MANAGE AND REDUCE SYMPTOMS OF HYPERLIPIDEMIA       Current Barriers:  Knowledge Deficits related to Hyperlipidemia management Chronic Disease Management support and education needs related to Hyperlipidemia, diet, smoking cessation Financial Constraints.  No Advanced Directives in place- pt declines Patient reports she has "cut back on smoking, down to 1-2 cigarettes per day"  and feels she is making good progress towards completely quitting. Patient reports she does not follow a special diet, walks in the evening after work. Patient hospitalized 02/20/23 for symptomatic anemia, states she is feeling better and will be following up with hematologist on 03/09/23 at 2 pm and have labwork at 3 pm Patient reports she has returned to her job [employer]  Planned Interventions: Provider established cholesterol goals reviewed; Counseled on importance of regular laboratory monitoring as prescribed; Discussed strategies to manage statin-induced myalgias; Reviewed importance of limiting foods high in cholesterol; Reviewed exercise goals and target of 150 minutes per week; Reviewed upcoming scheduled appointments including hematology Reviewed importance of having regular labwork/ monitoring for anemia  Symptom Management: Take medications as prescribed   Attend all scheduled provider appointments Call pharmacy for  medication refills 3-7 days in advance of running out of medications Attend church or other social activities Perform all self care activities independently  Perform IADL's (shopping, preparing meals, housekeeping, managing finances) independently Call provider office for new concerns or questions  - call doctor with any symptoms you believe are related to your medicine - call doctor when you experience any new symptoms - go to all doctor appointments as scheduled - adhere to prescribed diet: heart healthy Follow heart healthy diet  Follow Up Plan: Telephone follow up appointment with care management team member scheduled for:  04/17/23 at 215 pm       CCM (HYPERTENSION) EXPECTED OUTCOME: MONITOR, SELF-MANAGE AND REDUCE SYMPTOMS OF HYPERTENSION       Current Barriers:  Knowledge Deficits related to Hypertension management Care Coordination needs related to medication management in a patient with Hypertension Chronic Disease Management support and education needs related to Hypertension, diet, achieving a healthy lifestyle Financial Constraints.  No Advanced Directives in place- pt declines Patient reports she lives alone in hotel, has a sister that assists with transportation and is always available to help if needed.  Patient reports she is looking for housing as she had to go to court about her bill at the hotel and this is still in progress, social worker was recently working with pt and provided necessary resources to find housing, pt verbalizes understanding of resources and is able to call and check on these resources herself.  Patient reports she works 3 days per week, gets 362$ from [employer]) Patient reports her blood pressure is checked at work, pt reports she can get a blood pressure cuff from [employer] benefit  and plans to do so. Patient reports she has all medications except norvasc and plans to pickup today from [facility]  Planned Interventions: Evaluation of  current treatment plan related to hypertension self management and patient's adherence to plan as established by provider;   Reviewed medications with patient and discussed importance of compliance;  Counseled on adverse effects of illicit drug and excessive alcohol use in patients with high blood pressure;  Counseled on the importance of exercise goals with target of 150 minutes per week Discussed plans with patient for ongoing care management follow up and provided patient with direct contact information for care management team; Advised patient, providing education and rationale, to monitor blood pressure daily and record, calling PCP for findings outside established parameters;  Discussed complications of poorly controlled blood pressure such as heart disease, stroke, circulatory complications, vision complications, kidney impairment, sexual dysfunction;  Reviewed importance of taking medications as prescribed and timely refills Reinforced low sodium diet  Symptom Management: Take medications as prescribed   Attend all scheduled provider appointments Call pharmacy for medication refills 3-7 days in advance of running out of medications Attend church or other social activities Perform all self care activities independently  Perform IADL's (shopping, preparing meals, housekeeping, managing finances) independently Call provider office for new concerns or questions  check blood pressure 3 times per week choose a place to take my blood pressure (home, clinic or office, retail store) write blood pressure results in a log or diary learn about high blood pressure keep a blood pressure log take blood pressure log to all doctor appointments call doctor for signs and symptoms of high blood pressure keep all doctor appointments take medications for blood pressure exactly as prescribed report new symptoms to your doctor eat more whole grains, fruits and vegetables, lean meats and healthy  fats Follow low sodium diet- read food labels for sodium content Continue to contact and follow up with agencies/ resources provided by social worker to find housing  Follow Up Plan: Telephone follow up appointment with care management team member scheduled for:   04/17/23 at 215 pm          Plan:Telephone follow up appointment with care management team member scheduled for:  04/17/23 at 215 pm  Irving Shows [facility], BSN RN Case Manager [facility] [phone]

## 2023-03-07 NOTE — Progress Notes (Unsigned)
I,Jameka J Llittleton, CMA,acting as a Neurosurgeon for [employer], NP.,have documented all relevant documentation on the behalf of Nechelle Petrizzo, NP,as directed by  Lynanne Delgreco Moshe Salisbury, NP while in the presence of Emmilynn Marut, NP.  Subjective:  Patient ID: [name] , female    DOB: [birth year] , 62 y.o.   MRN: [id number]  No chief complaint on file.   HPI  Patient presents today for a hospital follow up . She was admitted in the hospital on 02/20/2023 for low hemoglobin of 6, she got 3 units of PRBUs and one bag of iron transfusion. She reports that her medications were changed in the hospital, she was stopped  from taking Aspirin and put on hydralazine 50mg  TID, Imdur 60mg  every day for better BP control . At discharge, patient was referred to Hematology for further evaluation, her appointment is scheduled  for  03/09/2023 at 2 pm and she will have labs  drawn.  Patient was discharged on 02/23/2023.       Past Medical History:  Diagnosis Date   Adrenal adenoma 01/11/2021   Anemia    CHF (congestive heart failure) (HCC)    Chronic bronchitis (HCC)    "probably once/yr" (05/12/2013)   Diastolic heart failure    Dyslipidemia    pt denies this hx on 05/12/2013   Exertional shortness of breath    GERD (gastroesophageal reflux disease)    Hypertension    Hypokalemia    Left ventricular hypertrophy 01/11/2021   Venous stasis ulcers (HCC)      Family History  Problem Relation Age of Onset   Hypertension Mother    Kidney failure Mother    Heart attack Father    Hypertension Sister    Diabetes Sister    Hypertension Brother    Diabetes Sister      Current Outpatient Medications:    acetaminophen (TYLENOL) 325 MG tablet, Take 650 mg by mouth every 6 (six) hours as needed for mild pain or headache., Disp: , Rfl:    Bismuth/Metronidaz/Tetracyclin (PYLERA) 140-125-125 MG CAPS, Take 3 capsules by mouth 4 (four) times daily -  before meals and at bedtime for 10 days., Disp: 120 capsule, Rfl: 0    carvedilol (COREG) 6.25 MG tablet, Take 1 tablet (6.25 mg total) by mouth 2 (two) times daily with a meal., Disp: 60 tablet, Rfl: 3   ferrous gluconate (FERGON) 324 MG tablet, Take 1 tablet (324 mg total) by mouth daily with breakfast., Disp: 30 tablet, Rfl: 3   gabapentin (NEURONTIN) 100 MG capsule, Take 2 capsules (200 mg total) by mouth 2 (two) times daily., Disp: 120 capsule, Rfl: 5   hydrALAZINE (APRESOLINE) 50 MG tablet, Take 1 tablet (50 mg total) by mouth 3 (three) times daily., Disp: 90 tablet, Rfl: 2   isosorbide mononitrate (IMDUR) 60 MG 24 hr tablet, Take 1 tablet (60 mg total) by mouth daily., Disp: 30 tablet, Rfl: 3   ondansetron (ZOFRAN) 4 MG tablet, Take 1 tablet (4 mg total) by mouth every 8 (eight) hours as needed for nausea or vomiting., Disp: 30 tablet, Rfl: 1   pantoprazole (PROTONIX) 40 MG tablet, Take 1 tablet (40 mg total) by mouth 2 (two) times daily before a meal. Twice a day X 2 months, then continue daily, Disp: 60 tablet, Rfl: 3   polyethylene glycol (MIRALAX / GLYCOLAX) 17 g packet, Take 17 g by mouth daily., Disp: , Rfl:    spironolactone (ALDACTONE) 25 MG tablet, Take 1 tablet (25 mg total) by mouth  daily., Disp: 90 tablet, Rfl: 1   sucralfate (CARAFATE) 1 g tablet, Take 1 tablet (1 g total) by mouth 2 (two) times daily for 14 days., Disp: 28 tablet, Rfl: 0   amLODipine (NORVASC) 10 MG tablet, Take 1 tablet (10 mg total) by mouth daily., Disp: 90 tablet, Rfl: 1   atorvastatin (LIPITOR) 40 MG tablet, TAKE 1 TABLET(40 MG) BY MOUTH DAILY, Disp: 90 tablet, Rfl: 1   FARXIGA 10 MG TABS tablet, Take 1 tablet (10 mg total) by mouth daily., Disp: 90 tablet, Rfl: 1   hydrocortisone (ANUSOL-HC) 25 MG suppository, Place 1 suppository (25 mg total) rectally every 12 (twelve) hours., Disp: 60 suppository, Rfl: 1   Allergies  Allergen Reactions   Lisinopril Cough     Review of Systems  Constitutional: Negative.   HENT: Negative.    Respiratory: Negative.    Cardiovascular:   Positive for leg swelling. Negative for chest pain and palpitations.  Psychiatric/Behavioral: Negative.       Today's Vitals   03/07/23 1156  BP: (!) 140/74  Pulse: 79  Temp: 98.1 F (36.7 C)  Weight: 201 lb (91.2 kg)  Height: 5\' 8"  (1.727 m)  PainSc: 8   PainLoc: Head   Body mass index is 30.56 kg/m.  Wt Readings from Last 3 Encounters:  03/09/23 213 lb (96.6 kg)  03/07/23 201 lb (91.2 kg)  02/23/23 201 lb 11.5 oz (91.5 kg)     Objective:  Physical Exam Cardiovascular:     Pulses: Normal pulses.  Musculoskeletal:        General: Normal range of motion.  Neurological:     Mental Status: She is alert.         Assessment And Plan:  Symptomatic anemia Assessment & Plan: 3 PRBCs tranfused  02/20/2023,referred to hematology   Hypertension due to endocrine disorder -     Farxiga; Take 1 tablet (10 mg total) by mouth daily.  Dispense: 90 tablet; Refill: 1 -     amLODIPine Besylate; Take 1 tablet (10 mg total) by mouth daily.  Dispense: 90 tablet; Refill: 1  Tobacco dependence Assessment & Plan: Counseling given      Return if symptoms worsen or fail to improve, for keep next scheduled appt.  Patient was given opportunity to ask questions. Patient verbalized understanding of the plan and was able to repeat key elements of the plan. All questions were answered to their satisfaction.  Shaima Sardinas Moshe Salisbury, NP  I, Poseidon Pam Moshe Salisbury, NP, have reviewed all documentation for this visit. The documentation on 03/19/23 for the exam, diagnosis, procedures, and orders are all accurate and complete.   IF YOU HAVE BEEN REFERRED TO A SPECIALIST, IT MAY TAKE 1-2 WEEKS TO SCHEDULE/PROCESS THE REFERRAL. IF YOU HAVE NOT HEARD FROM US/SPECIALIST IN TWO WEEKS, PLEASE GIVE [place] A CALL AT [phone] X [phone].   THE PATIENT IS ENCOURAGED TO PRACTICE SOCIAL DISTANCING DUE TO THE COVID-19 PANDEMIC.

## 2023-03-07 NOTE — Assessment & Plan Note (Signed)
Counseling given. 

## 2023-03-07 NOTE — Patient Instructions (Signed)
Please call the care guide team at [phone] if you need to cancel or reschedule your appointment.   If you are experiencing a Mental Health or Behavioral Health Crisis or need someone to talk to, please call the Suicide and Crisis Lifeline: 988 call the [place] National Suicide Prevention Lifeline: 904-272-2267 or TTY: (559)244-5966 TTY 731-782-5975) to talk to a trained counselor call 1-800-273-TALK (toll free, 24 hour hotline) go to [facility] [address] [place] [phone]) call 911   Following is a copy of the CCM Program Consent:  CCM service includes personalized support from designated clinical staff supervised by the physician, including individualized plan of care and coordination with other care providers 24/7 contact phone numbers for assistance for urgent and routine care needs. Service will only be billed when office clinical staff spend 20 minutes or more in a month to coordinate care. Only one practitioner may furnish and bill the service in a calendar month. The patient may stop CCM services at amy time (effective at the end of the month) by phone call to the office staff. The patient will be responsible for cost sharing (co-pay) or up to 20% of the service fee (after annual deductible is met)  Following is a copy of your full provider care plan:   Goals Addressed             This Visit's Progress    CCM (HYPERLIPIDEMIA) EXPECTED OUTCOME: MONITOR, SELF-MANAGE AND REDUCE SYMPTOMS OF HYPERLIPIDEMIA       Current Barriers:  Knowledge Deficits related to Hyperlipidemia management Chronic Disease Management support and education needs related to Hyperlipidemia, diet, smoking cessation Financial Constraints.  No Advanced Directives in place- pt declines Patient reports she has "cut back on smoking, down to 1-2 cigarettes per day"  and feels she is making good progress towards completely quitting. Patient reports she does not  follow a special diet, walks in the evening after work. Patient hospitalized 02/20/23 for symptomatic anemia, states she is feeling better and will be following up with hematologist on 03/09/23 at 2 pm and have labwork at 3 pm Patient reports she has returned to her job [employer]  Planned Interventions: Provider established cholesterol goals reviewed; Counseled on importance of regular laboratory monitoring as prescribed; Discussed strategies to manage statin-induced myalgias; Reviewed importance of limiting foods high in cholesterol; Reviewed exercise goals and target of 150 minutes per week; Reviewed upcoming scheduled appointments including hematology Reviewed importance of having regular labwork/ monitoring for anemia  Symptom Management: Take medications as prescribed   Attend all scheduled provider appointments Call pharmacy for medication refills 3-7 days in advance of running out of medications Attend church or other social activities Perform all self care activities independently  Perform IADL's (shopping, preparing meals, housekeeping, managing finances) independently Call provider office for new concerns or questions  - call doctor with any symptoms you believe are related to your medicine - call doctor when you experience any new symptoms - go to all doctor appointments as scheduled - adhere to prescribed diet: heart healthy Follow heart healthy diet  Follow Up Plan: Telephone follow up appointment with care management team member scheduled for:  04/17/23 at 215 pm       CCM (HYPERTENSION) EXPECTED OUTCOME: MONITOR, SELF-MANAGE AND REDUCE SYMPTOMS OF HYPERTENSION       Current Barriers:  Knowledge Deficits related to Hypertension management Care Coordination needs related to medication management in a patient with Hypertension Chronic Disease Management support and education  needs related to Hypertension, diet, achieving a healthy lifestyle Financial Constraints.  No  Advanced Directives in place- pt declines Patient reports she lives alone in hotel, has a sister that assists with transportation and is always available to help if needed.  Patient reports she is looking for housing as she had to go to court about her bill at the hotel and this is still in progress, social worker was recently working with pt and provided necessary resources to find housing, pt verbalizes understanding of resources and is able to call and check on these resources herself.  Patient reports she works 3 days per week, gets 362$ from [employer]) Patient reports her blood pressure is checked at work, pt reports she can get a blood pressure cuff from [employer] benefit and plans to do so. Patient reports she has all medications except norvasc and plans to pickup today from [facility]  Planned Interventions: Evaluation of current treatment plan related to hypertension self management and patient's adherence to plan as established by provider;   Reviewed medications with patient and discussed importance of compliance;  Counseled on adverse effects of illicit drug and excessive alcohol use in patients with high blood pressure;  Counseled on the importance of exercise goals with target of 150 minutes per week Discussed plans with patient for ongoing care management follow up and provided patient with direct contact information for care management team; Advised patient, providing education and rationale, to monitor blood pressure daily and record, calling PCP for findings outside established parameters;  Discussed complications of poorly controlled blood pressure such as heart disease, stroke, circulatory complications, vision complications, kidney impairment, sexual dysfunction;  Reviewed importance of taking medications as prescribed and timely refills Reinforced low sodium diet  Symptom Management: Take medications as prescribed   Attend all scheduled provider appointments Call  pharmacy for medication refills 3-7 days in advance of running out of medications Attend church or other social activities Perform all self care activities independently  Perform IADL's (shopping, preparing meals, housekeeping, managing finances) independently Call provider office for new concerns or questions  check blood pressure 3 times per week choose a place to take my blood pressure (home, clinic or office, retail store) write blood pressure results in a log or diary learn about high blood pressure keep a blood pressure log take blood pressure log to all doctor appointments call doctor for signs and symptoms of high blood pressure keep all doctor appointments take medications for blood pressure exactly as prescribed report new symptoms to your doctor eat more whole grains, fruits and vegetables, lean meats and healthy fats Follow low sodium diet- read food labels for sodium content Continue to contact and follow up with agencies/ resources provided by social worker to find housing  Follow Up Plan: Telephone follow up appointment with care management team member scheduled for:   04/17/23 at 215 pm          The patient verbalized understanding of instructions, educational materials, and care plan provided today and DECLINED offer to receive copy of patient instructions, educational materials, and care plan.  Telephone follow up appointment with care management team member scheduled for:  04/17/23 at 215 pm

## 2023-03-08 NOTE — Progress Notes (Signed)
[facility] Telephone:([phone]) [phone]   [phone]  INITIAL CONSULT NOTE  Patient Care Team: [name], FNP as PCP - General (General Practice) Chilton Si, MD as PCP - Cardiology (Cardiology) Evlyn Kanner, MD (Inactive) as Consulting Physician (Surgery) Audrie Gallus, RN as [facility] Care Management  Hematological/Oncological History 02/20/2023-02/23/2023: Admitted for symptomatic anemia after presented with weakness, malaise and exertional dyspnea. Hgb was 6.0.  Received 3 units of PRBC and IV ferrlecit 250 mg x 1 dose.  EGD showed gastritis angiodysplastic lesion on the anterior wall of the gastric body status post argon plasma ablation, localized moderate inflammation characterized by erosions, erythema and linear erosions in the gastric body and antrum, patchy mildly congested mucosa without active bleeding and with no stigmata of bleeding in duodenum. Colonoscopy showed normal mucosa in the entire colon, nonbleeding nonthrombosed external and internal hemorrhoids. 03/09/2023: Established care with [facility] Hematology  CHIEF COMPLAINTS/PURPOSE OF CONSULTATION:  Microcytic anemia  HISTORY OF PRESENTING ILLNESS:  [name] 62 y.o. female with medical history significant for CHF, HTN, GERD, hyperlipidemia, and CKD presents to the clinic for evaluation of microcytic anemia. She is unaccompanied for this visit.   On exam today, Ms. Marlett reports energy levels improved since hospital discharge. She is compliant with taking iron pills as prescribed and takes stool softeners/prune juice to prevent constipation. She still experiences occasional shortness of breath with exertion. She denies nausea, vomiting or abdominal pain. She denies easy bruising or signs of active bleeding. She does have occasional headaches secondary to congestion. She denies fevers, chills, sweats, chest pain or cough. She has no other complaints. Rest of the ROS is below.    MEDICAL HISTORY:  Past Medical History:  Diagnosis Date   Adrenal adenoma 01/11/2021   Anemia    CHF (congestive heart failure) (HCC)    Chronic bronchitis (HCC)    "probably once/yr" (05/12/2013)   Diastolic heart failure    Dyslipidemia    pt denies this hx on 05/12/2013   Exertional shortness of breath    GERD (gastroesophageal reflux disease)    Hypertension    Hypokalemia    Left ventricular hypertrophy 01/11/2021   Venous stasis ulcers (HCC)     SURGICAL HISTORY: Past Surgical History:  Procedure Laterality Date   BIOPSY  02/23/2023   Procedure: BIOPSY;  Surgeon: Lemar Lofty., MD;  Location: [facility] ENDOSCOPY;  Service: Gastroenterology;;   COLONOSCOPY N/A 02/23/2023   Procedure: COLONOSCOPY;  Surgeon: Lemar Lofty., MD;  Location: [facility] ENDOSCOPY;  Service: Gastroenterology;  Laterality: N/A;   ESOPHAGOGASTRODUODENOSCOPY N/A 02/23/2023   Procedure: ESOPHAGOGASTRODUODENOSCOPY (EGD);  Surgeon: Lemar Lofty., MD;  Location[facility] ENDOSCOPY;  Service: Gastroenterology;  Laterality: N/A;   HOT HEMOSTASIS N/A 02/23/2023   Procedure: HOT HEMOSTASIS (ARGON PLASMA COAGULATION/BICAP);  Surgeon: Lemar Lofty., MD;  Location: [facility] ENDOSCOPY;  Service: Gastroenterology;  Laterality: N/A;   IR US GUIDE VASC ACCESS RIGHT  02/27/2017   IR VENOGRAM ADRENAL BI  02/27/2017   IR VENOGRAM HEPATIC WO HEMODYNAMIC EVALUATION  02/27/2017   IR VENOGRAM RENAL BI  02/27/2017   IR VENOUS SAMPLING  02/27/2017   IR VENOUS SAMPLING  02/27/2017   POLYPECTOMY  02/23/2023   Procedure: POLYPECTOMY;  Surgeon: Mansouraty, Netty Starring., MD;  Location: [facility] ENDOSCOPY;  Service: Gastroenterology;;   VAGINAL HYSTERECTOMY  [phone]    SOCIAL HISTORY: Social History   Socioeconomic History   Marital status: Single    Spouse name: Not on file   Number of children:  Not on file   Years of education: Not on file   Highest education level: Not on file  Occupational History   Occupation: disability   Tobacco Use   Smoking status: Every Day    Current packs/day: 0.50    Average packs/day: 0.5 packs/day for 10.0 years (5.0 ttl pk-yrs)    Types: Cigarettes   Smokeless tobacco: Never   Tobacco comments:    trying to cut back, 3/29 - only had one cigarette today, average 4 a day  Vaping Use   Vaping status: Never Used  Substance and Sexual Activity   Alcohol use: Not Currently    Alcohol/week: 7.0 standard drinks of alcohol    Types: 7 Cans of beer per week    Comment: Beer once a week.   Drug use: No    Types: Cocaine    Comment: 05/12/2013 "tried it a few days ago; didn't like it"   Sexual activity: Not Currently  Other Topics Concern   Not on file  Social History Narrative   Not on file   Social Determinants of Health   Financial Resource Strain: High Risk (11/06/2022)   Overall Financial Resource Strain (CARDIA)    Difficulty of Paying Living Expenses: Very hard  Food Insecurity: No Food Insecurity (02/27/2023)   Hunger Vital Sign    Worried About Running Out of Food in the Last Year: Never true    Ran Out of Food in the Last Year: Never true  Transportation Needs: No Transportation Needs (02/27/2023)   PRAPARE - Administrator, Civil Service (Medical): No    Lack of Transportation (Non-Medical): No  Physical Activity: Insufficiently Active (11/06/2022)   Exercise Vital Sign    Days of Exercise per Week: 5 days    Minutes of Exercise per Session: 20 min  Stress: No Stress Concern Present (11/06/2022)   [employer] of Occupational Health - Occupational Stress Questionnaire    Feeling of Stress : Only a little  Social Connections: Moderately Isolated (11/06/2022)   Social Connection and Isolation Panel [NHANES]    Frequency of Communication with Friends and Family: Twice a week    Frequency of Social Gatherings with Friends and Family: Once a week    Attends Religious Services: More than 4 times per year    Active Member of [employer] or Organizations: No     Attends Banker Meetings: Never    Marital Status: Never married  Intimate Partner Violence: Not At Risk (02/20/2023)   Humiliation, Afraid, Rape, and Kick questionnaire    Fear of Current or Ex-Partner: No    Emotionally Abused: No    Physically Abused: No    Sexually Abused: No    FAMILY HISTORY: Family History  Problem Relation Age of Onset   Hypertension Mother    Kidney failure Mother    Heart attack Father    Hypertension Sister    Diabetes Sister    Hypertension Brother    Diabetes Sister     ALLERGIES:  is allergic to lisinopril.  MEDICATIONS:  Current Outpatient Medications  Medication Sig Dispense Refill   acetaminophen (TYLENOL) 325 MG tablet Take 650 mg by mouth every 6 (six) hours as needed for mild pain or headache.     amLODipine (NORVASC) 10 MG tablet Take 1 tablet (10 mg total) by mouth daily. 90 tablet 1   Bismuth/Metronidaz/Tetracyclin (PYLERA) 140-125-125 MG CAPS Take 3 capsules by mouth 4 (four) times daily -  before meals and at bedtime  for 10 days. 120 capsule 0   carvedilol (COREG) 6.25 MG tablet Take 1 tablet (6.25 mg total) by mouth 2 (two) times daily with a meal. 60 tablet 3   FARXIGA 10 MG TABS tablet Take 1 tablet (10 mg total) by mouth daily. 90 tablet 1   ferrous gluconate (FERGON) 324 MG tablet Take 1 tablet (324 mg total) by mouth daily with breakfast. 30 tablet 3   gabapentin (NEURONTIN) 100 MG capsule Take 2 capsules (200 mg total) by mouth 2 (two) times daily. 120 capsule 5   hydrALAZINE (APRESOLINE) 50 MG tablet Take 1 tablet (50 mg total) by mouth 3 (three) times daily. 90 tablet 2   hydrocortisone (ANUSOL-HC) 25 MG suppository Place 1 suppository (25 mg total) rectally every 12 (twelve) hours. 60 suppository 1   isosorbide mononitrate (IMDUR) 60 MG 24 hr tablet Take 1 tablet (60 mg total) by mouth daily. 30 tablet 3   ondansetron (ZOFRAN) 4 MG tablet Take 1 tablet (4 mg total) by mouth every 8 (eight) hours as needed for nausea  or vomiting. 30 tablet 1   pantoprazole (PROTONIX) 40 MG tablet Take 1 tablet (40 mg total) by mouth 2 (two) times daily before a meal. Twice a day X 2 months, then continue daily 60 tablet 3   polyethylene glycol (MIRALAX / GLYCOLAX) 17 g packet Take 17 g by mouth daily.     spironolactone (ALDACTONE) 25 MG tablet Take 1 tablet (25 mg total) by mouth daily. 90 tablet 1   sucralfate (CARAFATE) 1 g tablet Take 1 tablet (1 g total) by mouth 2 (two) times daily for 14 days. 28 tablet 0   atorvastatin (LIPITOR) 40 MG tablet TAKE 1 TABLET(40 MG) BY MOUTH DAILY 90 tablet 1   No current facility-administered medications for this visit.    REVIEW OF SYSTEMS:   Constitutional: ( - ) fevers, ( - )  chills , ( - ) night sweats Eyes: ( - ) blurriness of vision, ( - ) double vision, ( - ) watery eyes Ears, nose, mouth, throat, and face: ( - ) mucositis, ( - ) sore throat Respiratory: ( - ) cough, ( + ) dyspnea, ( - ) wheezes Cardiovascular: ( - ) palpitation, ( - ) chest discomfort, ( - ) lower extremity swelling Gastrointestinal:  ( - ) nausea, ( - ) heartburn, ( - ) change in bowel habits Skin: ( - ) abnormal skin rashes Lymphatics: ( - ) new lymphadenopathy, ( - ) easy bruising Neurological: ( - ) numbness, ( - ) tingling, ( - ) new weaknesses Behavioral/Psych: ( - ) mood change, ( - ) new changes  All other systems were reviewed with the patient and are negative.  PHYSICAL EXAMINATION: ECOG PERFORMANCE STATUS: 1 - Symptomatic but completely ambulatory  Vitals:   03/09/23 1416  BP: (!) 182/80  Pulse: 76  Resp: 18  Temp: 98.1 F (36.7 C)  SpO2: 100%   Filed Weights   03/09/23 1416  Weight: 213 lb (96.6 kg)    GENERAL: well appearing female in NAD  SKIN: skin color, texture, turgor are normal, no rashes or significant lesions EYES: conjunctiva are pink and non-injected, sclera clear LUNGS: clear to auscultation and percussion with normal breathing effort HEART: regular rate & rhythm  and no murmurs and no lower extremity edema Musculoskeletal: no cyanosis of digits and no clubbing  PSYCH: alert & oriented x 3, fluent speech NEURO: no focal motor/sensory deficits  LABORATORY DATA:  I have  reviewed the data as listed    Latest Ref Rng & Units 03/09/2023    2:48 PM 02/23/2023    3:49 AM 02/22/2023   12:39 AM  CBC  WBC 4.0 - 10.5 K/uL 7.2  8.0  9.3   Hemoglobin 12.0 - 15.0 g/dL 8.0  7.5  8.1   Hematocrit 36.0 - 46.0 % 27.8  25.9  27.2   Platelets 150 - 400 K/uL 575  324  371        Latest Ref Rng & Units 03/09/2023    2:48 PM 02/23/2023    3:49 AM 02/22/2023   12:39 AM  CMP  Glucose 70 - 99 mg/dL 89  88  94   BUN 8 - 23 mg/dL 37  29  34   Creatinine 0.44 - 1.00 mg/dL 1.61  0.96  0.45   Sodium 135 - 145 mmol/L 141  141  139   Potassium 3.5 - 5.1 mmol/L 4.4  4.3  4.7   Chloride 98 - 111 mmol/L 110  110  110   CO2 22 - 32 mmol/L 24  21  21    Calcium 8.9 - 10.3 mg/dL 8.5  8.4  8.7   Total Protein 6.5 - 8.1 g/dL 7.6     Total Bilirubin 0.3 - 1.2 mg/dL 0.3     Alkaline Phos 38 - 126 U/L 62     AST 15 - 41 U/L 11     ALT 0 - 44 U/L 15        RADIOGRAPHIC STUDIES: I have personally reviewed the radiological images as listed and agreed with the findings in the report. US Abdomen Complete  Result Date: 02/21/2023 CLINICAL DATA:  Evaluate liver and spleen. EXAM: ABDOMEN ULTRASOUND COMPLETE COMPARISON:  02/05/2017 FINDINGS: Gallbladder: No gallstones or wall thickening visualized. The gallbladder is contracted. No sonographic Murphy sign noted by sonographer. Common bile duct: Diameter: 4.0 mm Liver: A focal hypoechoic structure is seen along the posterior aspect of the right lobe of the liver measuring 4.2 x 2.9 x 4.0 cm, which corresponds with known right adrenal nodule. Within normal limits in parenchymal echogenicity. Portal vein is patent on color Doppler imaging with normal direction of blood flow towards the liver. IVC: No abnormality visualized. Pancreas: Visualized  portion unremarkable. Spleen: Size and appearance within normal limits. Right Kidney: Length: 10.2 cm. Echogenicity within normal limits. No mass or hydronephrosis visualized. Left Kidney: Length: 9.1 cm. Echogenicity within normal limits. No mass or hydronephrosis visualized. Abdominal aorta: No aneurysm visualized. Other findings: No free fluid. IMPRESSION: 1. Normal evaluation of the liver and spleen. 2. Hypoechoic lesion along along the posterior aspect of the liver measuring 4.2 x 2.9 x 4.0 cm versus 3.7 x 3.1 cm in 2018, previously characterized as functional adrenal adenoma. Given enlargement over time, surgical consultation is recommended. Electronically Signed   By: Thornell Sartorius M.D.   On: 02/21/2023 22:23   DG Abd 1 View  Result Date: 02/20/2023 CLINICAL DATA:  abdomen pain, abdomen distended. EXAM: ABDOMEN - 1 VIEW COMPARISON:  09/05/2018 FINDINGS: Stomach and small bowel decompressed. There is mild gaseous distention of the descending colon, decompressed distally. Stable left pelvic phleboliths. Regional bones unremarkable. IMPRESSION: Mild gaseous distention of the descending colon. Electronically Signed   By: Corlis Leak M.D.   On: 02/20/2023 15:25   US RENAL  Result Date: 02/20/2023 CLINICAL DATA:  [id number] AKI (acute kidney injury) (HCC) [id number] EXAM: RENAL / URINARY TRACT ULTRASOUND COMPLETE COMPARISON:  04/19/2021 FINDINGS: Right  Kidney: Renal measurements: 11.6 x 4.9 x 5.6 cm = volume: 165 mL. Parenchyma is slightly hyperechoic compared to adjacent liver. 1.1 cm cyst in the upper pole. No hydronephrosis. Left Kidney: Renal measurements: 10.7 x 5.7 x 5 cm = volume: 159 mL. Echogenicity within normal limits. No mass or hydronephrosis visualized. Bladder: Appears normal for degree of bladder distention. Ureteral jets not demonstrated. Other: Technologist describes technically difficult study secondary to bowel gas and body habitus. IMPRESSION: No hydronephrosis or other acute finding. Echogenic  renal parenchyma, a nonspecific finding seen in medical renal disease. Electronically Signed   By: Corlis Leak M.D.   On: 02/20/2023 15:23    ASSESSMENT & PLAN Natania Finigan is a 62 y.o. female who presents to the hematology clinic for evaluation of microcytic anemia.   #Microcytic anemia: --Likely multifactorial from iron deficiency and CKD --Underwent EGD/colonoscopy on 02/23/2023. EGD showed gastritis angiodysplastic lesion on the anterior wall of the gastric body status post argon plasma ablation, localized moderate inflammation characterized by erosions, erythema and linear erosions in the gastric body and antrum, patchy mildly congested mucosa without active bleeding and with no stigmata of bleeding in duodenum.Colonoscopy showed normal mucosa in the entire colon, nonbleeding nonthrombosed external and internal hemorrhoids. --Last received IV ferrlecit 250 mg x 1 dose on 02/21/2023. Currently receive PO iron daily.  --Labs today to check CBC, CMP, retic panel iron panel, erythropoietin --Consider Epo injections if iron levels are normal and there is persistent anemia from CKD --Evaluate for monoclonal gammopathy by check SPEP/IFE and serum free light chains.  --RTC once workup is complete  Orders Placed This Encounter  Procedures   CBC with Differential (Cancer Center Only)    Standing Status:   Future    Number of Occurrences:   1    Standing Expiration Date:   03/08/2024   CMP (Cancer Center only)    Standing Status:   Future    Number of Occurrences:   1    Standing Expiration Date:   03/08/2024   Ferritin    Standing Status:   Future    Number of Occurrences:   1    Standing Expiration Date:   03/08/2024   Erythropoietin    Standing Status:   Future    Number of Occurrences:   1    Standing Expiration Date:   03/08/2024   Kappa/lambda light chains    Standing Status:   Future    Number of Occurrences:   1    Standing Expiration Date:   03/08/2024   Multiple Myeloma Panel  (SPEP&IFE w/QIG)    Standing Status:   Future    Number of Occurrences:   1    Standing Expiration Date:   03/08/2024   Reticulocytes    Standing Status:   Future    Number of Occurrences:   1    Standing Expiration Date:   03/08/2024    All questions were answered. The patient knows to call the clinic with any problems, questions or concerns.  I have spent a total of 60 minutes minutes of face-to-face and non-face-to-face time, preparing to see the patient, obtaining and/or reviewing separately obtained history, performing a medically appropriate examination, counseling and educating the patient, ordering tests/procedures, documenting clinical information in the electronic health record, and care coordination.   Georga Kaufmann, PA-C Department of Hematology/Oncology [facility] at [facility] Phone: [phone]  Patient was seen with Dr. Leonides Schanz  I have read the above note and  personally examined the patient. I agree with the assessment and plan as noted above.  Briefly Ms. Balles is a 62 year old female who presents for evaluation of a chronic microcytic anemia.  At this time her microcytic anemia appears multifactorial.  She has chronic kidney disease as well as likely iron deficiency from a GI bleeding.  Today we will order full anemia panel to include iron panel, ferritin, erythropoietin, SPEP, and serum free light chains.  In the event that she is iron deficient we will replete her iron stores with IV iron therapy.  Additionally she may require erythropoeitin therapy if her creatinine does not improve and her hemoglobin is persistently below 10.   Ulysees Barns, MD Department of Hematology/Oncology [facility] at [facility] Phone: [phone] Pager: [phone] Email: [email or web address]

## 2023-03-09 ENCOUNTER — Inpatient Hospital Stay: Payer: 59

## 2023-03-09 ENCOUNTER — Other Ambulatory Visit: Payer: Self-pay

## 2023-03-09 ENCOUNTER — Inpatient Hospital Stay: Payer: 59 | Attending: Physician Assistant | Admitting: Physician Assistant

## 2023-03-09 VITALS — BP 182/80 | HR 76 | Temp 98.1°F | Resp 18 | Ht 68.0 in | Wt 213.0 lb

## 2023-03-09 DIAGNOSIS — D509 Iron deficiency anemia, unspecified: Secondary | ICD-10-CM | POA: Insufficient documentation

## 2023-03-09 DIAGNOSIS — F17210 Nicotine dependence, cigarettes, uncomplicated: Secondary | ICD-10-CM | POA: Insufficient documentation

## 2023-03-09 LAB — IRON AND IRON BINDING CAPACITY (CC-WL,HP ONLY)
Iron: 39 ug/dL (ref 28–170)
Saturation Ratios: 12 % (ref 10.4–31.8)
TIBC: 323 ug/dL (ref 250–450)
UIBC: 284 ug/dL (ref 148–442)

## 2023-03-09 LAB — CMP (CANCER CENTER ONLY)
ALT: 15 U/L (ref 0–44)
AST: 11 U/L — ABNORMAL LOW (ref 15–41)
Albumin: 3.8 g/dL (ref 3.5–5.0)
Alkaline Phosphatase: 62 U/L (ref 38–126)
Anion gap: 7 (ref 5–15)
BUN: 37 mg/dL — ABNORMAL HIGH (ref 8–23)
CO2: 24 mmol/L (ref 22–32)
Calcium: 8.5 mg/dL — ABNORMAL LOW (ref 8.9–10.3)
Chloride: 110 mmol/L (ref 98–111)
Creatinine: 2.45 mg/dL — ABNORMAL HIGH (ref 0.44–1.00)
GFR, Estimated: 22 mL/min — ABNORMAL LOW (ref >60)
Glucose, Bld: 89 mg/dL (ref 70–99)
Potassium: 4.4 mmol/L (ref 3.5–5.1)
Sodium: 141 mmol/L (ref 135–145)
Total Bilirubin: 0.3 mg/dL (ref 0.3–1.2)
Total Protein: 7.6 g/dL (ref 6.5–8.1)

## 2023-03-09 LAB — CBC WITH DIFFERENTIAL (CANCER CENTER ONLY)
Abs Immature Granulocytes: 0.03 10*3/uL (ref 0.00–0.07)
Basophils Absolute: 0 10*3/uL (ref 0.0–0.1)
Basophils Relative: 0 %
Eosinophils Absolute: 0.1 10*3/uL (ref 0.0–0.5)
Eosinophils Relative: 2 %
HCT: 27.8 % — ABNORMAL LOW (ref 36.0–46.0)
Hemoglobin: 8 g/dL — ABNORMAL LOW (ref 12.0–15.0)
Immature Granulocytes: 0 %
Lymphocytes Relative: 19 %
Lymphs Abs: 1.4 10*3/uL (ref 0.7–4.0)
MCH: 24 pg — ABNORMAL LOW (ref 26.0–34.0)
MCHC: 28.8 g/dL — ABNORMAL LOW (ref 30.0–36.0)
MCV: 83.5 fL (ref 80.0–100.0)
Monocytes Absolute: 0.8 10*3/uL (ref 0.1–1.0)
Monocytes Relative: 11 %
Neutro Abs: 4.9 10*3/uL (ref 1.7–7.7)
Neutrophils Relative %: 68 %
Platelet Count: 575 10*3/uL — ABNORMAL HIGH (ref 150–400)
RBC: 3.33 MIL/uL — ABNORMAL LOW (ref 3.87–5.11)
RDW: 23 % — ABNORMAL HIGH (ref 11.5–15.5)
WBC Count: 7.2 10*3/uL (ref 4.0–10.5)
nRBC: 0 % (ref 0.0–0.2)

## 2023-03-09 LAB — RETICULOCYTES
Immature Retic Fract: 33.3 % — ABNORMAL HIGH (ref 2.3–15.9)
RBC.: 3.29 MIL/uL — ABNORMAL LOW (ref 3.87–5.11)
Retic Count, Absolute: 63.8 10*3/uL (ref 19.0–186.0)
Retic Ct Pct: 1.9 % (ref 0.4–3.1)

## 2023-03-09 LAB — FERRITIN: Ferritin: 40 ng/mL (ref 11–307)

## 2023-03-10 LAB — ERYTHROPOIETIN: Erythropoietin: 35.7 m[IU]/mL — ABNORMAL HIGH (ref 2.6–18.5)

## 2023-03-12 ENCOUNTER — Other Ambulatory Visit (HOSPITAL_COMMUNITY): Payer: Self-pay

## 2023-03-12 LAB — KAPPA/LAMBDA LIGHT CHAINS
Kappa free light chain: 121.6 mg/L — ABNORMAL HIGH (ref 3.3–19.4)
Kappa, lambda light chain ratio: 1.45 (ref 0.26–1.65)
Lambda free light chains: 83.6 mg/L — ABNORMAL HIGH (ref 5.7–26.3)

## 2023-03-14 LAB — MULTIPLE MYELOMA PANEL, SERUM
Albumin SerPl Elph-Mcnc: 3.3 g/dL (ref 2.9–4.4)
Albumin/Glob SerPl: 0.9 (ref 0.7–1.7)
Alpha 1: 0.3 g/dL (ref 0.0–0.4)
Alpha2 Glob SerPl Elph-Mcnc: 0.9 g/dL (ref 0.4–1.0)
B-Globulin SerPl Elph-Mcnc: 1 g/dL (ref 0.7–1.3)
Gamma Glob SerPl Elph-Mcnc: 1.5 g/dL (ref 0.4–1.8)
Globulin, Total: 3.7 g/dL (ref 2.2–3.9)
IgA: 260 mg/dL (ref 87–352)
IgG (Immunoglobin G), Serum: 1591 mg/dL (ref 586–1602)
IgM (Immunoglobulin M), Srm: 46 mg/dL (ref 26–217)
Total Protein ELP: 7 g/dL (ref 6.0–8.5)

## 2023-03-16 ENCOUNTER — Other Ambulatory Visit: Payer: Self-pay | Admitting: Nurse Practitioner

## 2023-03-16 DIAGNOSIS — E782 Mixed hyperlipidemia: Secondary | ICD-10-CM

## 2023-03-16 NOTE — Assessment & Plan Note (Signed)
3 PRBCs tranfused  02/20/2023,referred to hematology

## 2023-03-19 ENCOUNTER — Telehealth: Payer: Self-pay

## 2023-03-19 NOTE — Telephone Encounter (Signed)
-----   Message from Briant Cedar sent at 03/19/2023  2:00 PM EDT ----- Please notify patient that iron levels have improved so no need for IV iron. Recommend to continue on iron pills.   We recommend EPO injection to help with anemia from chronic kidney disease. We will reach out to her nephrologist, Dr. Thedore Mins, if he can arrange the injections. Otherwise, we will schedule. ----- Message ----- From: Interface, Lab In [place] Sent: 03/09/2023   3:07 PM EDT To: Briant Cedar, PA-C

## 2023-03-19 NOTE — Telephone Encounter (Signed)
Pt advised of lab results, no IV iron needed and to continue oral iron. She was also advised of the need for EPO injections and Dr Thedore Mins has agreed to arrange the injections.  She was in agreement to this plan of care

## 2023-03-20 ENCOUNTER — Other Ambulatory Visit: Payer: Self-pay

## 2023-03-20 ENCOUNTER — Telehealth: Payer: Self-pay | Admitting: Pharmacist

## 2023-03-20 ENCOUNTER — Other Ambulatory Visit: Payer: 59 | Admitting: Pharmacist

## 2023-03-20 NOTE — Progress Notes (Unsigned)
Attempted to contact patient for scheduled appointment for medication management. Left HIPAA compliant message for patient to return my call at their convenience.   Catie T. Harper, PharmD, BCACP, CPP Clinical Pharmacist [facility] [phone]  

## 2023-03-21 ENCOUNTER — Other Ambulatory Visit (HOSPITAL_COMMUNITY): Payer: Self-pay

## 2023-03-21 DIAGNOSIS — E785 Hyperlipidemia, unspecified: Secondary | ICD-10-CM

## 2023-03-21 DIAGNOSIS — F17210 Nicotine dependence, cigarettes, uncomplicated: Secondary | ICD-10-CM | POA: Diagnosis not present

## 2023-03-21 DIAGNOSIS — I10 Essential (primary) hypertension: Secondary | ICD-10-CM | POA: Diagnosis not present

## 2023-03-27 ENCOUNTER — Telehealth: Payer: Self-pay

## 2023-03-27 NOTE — Progress Notes (Signed)
Care Guide Note  03/27/2023 Name: [name] MRN: [id number] DOB: 16-Mar-1961  Referred by: [name], FNP Reason for referral : Care Management (Outreach to reschedule f/u with Pharm d )   [name] is a 62 y.o. year old female who is a primary care patient of [name], FNP. Shakyla Gemmell was referred to the pharmacist for assistance related to DM.    An unsuccessful telephone outreach was attempted today to contact the patient who was referred to the pharmacy team for assistance with medication management. Additional attempts will be made to contact the patient.   Penne Lash, RMA Care Guide [facility]  [place], [place] [address] Direct Dial: [phone] [email or web address]

## 2023-04-09 ENCOUNTER — Emergency Department (HOSPITAL_COMMUNITY): Payer: 59

## 2023-04-09 ENCOUNTER — Emergency Department (HOSPITAL_COMMUNITY)
Admission: EM | Admit: 2023-04-09 | Discharge: 2023-04-09 | Disposition: A | Discharge status: Home / Self Care | Payer: 59 | Source: Home / Self Care | Attending: Emergency Medicine | Admitting: Emergency Medicine

## 2023-04-09 ENCOUNTER — Other Ambulatory Visit: Payer: Self-pay

## 2023-04-09 DIAGNOSIS — S8261XA Displaced fracture of lateral malleolus of right fibula, initial encounter for closed fracture: Secondary | ICD-10-CM | POA: Diagnosis not present

## 2023-04-09 DIAGNOSIS — W010XXA Fall on same level from slipping, tripping and stumbling without subsequent striking against object, initial encounter: Secondary | ICD-10-CM | POA: Diagnosis not present

## 2023-04-09 DIAGNOSIS — I5032 Chronic diastolic (congestive) heart failure: Secondary | ICD-10-CM | POA: Diagnosis not present

## 2023-04-09 DIAGNOSIS — R6889 Other general symptoms and signs: Secondary | ICD-10-CM | POA: Diagnosis not present

## 2023-04-09 DIAGNOSIS — Z79899 Other long term (current) drug therapy: Secondary | ICD-10-CM | POA: Diagnosis not present

## 2023-04-09 DIAGNOSIS — I130 Hypertensive heart and chronic kidney disease with heart failure and stage 1 through stage 4 chronic kidney disease, or unspecified chronic kidney disease: Secondary | ICD-10-CM | POA: Insufficient documentation

## 2023-04-09 DIAGNOSIS — S82841A Displaced bimalleolar fracture of right lower leg, initial encounter for closed fracture: Secondary | ICD-10-CM | POA: Diagnosis not present

## 2023-04-09 DIAGNOSIS — M25571 Pain in right ankle and joints of right foot: Secondary | ICD-10-CM | POA: Insufficient documentation

## 2023-04-09 DIAGNOSIS — N183 Chronic kidney disease, stage 3 unspecified: Secondary | ICD-10-CM | POA: Insufficient documentation

## 2023-04-09 DIAGNOSIS — S8251XA Displaced fracture of medial malleolus of right tibia, initial encounter for closed fracture: Secondary | ICD-10-CM | POA: Diagnosis not present

## 2023-04-09 DIAGNOSIS — S82401D Unspecified fracture of shaft of right fibula, subsequent encounter for closed fracture with routine healing: Secondary | ICD-10-CM | POA: Diagnosis not present

## 2023-04-09 DIAGNOSIS — S92101A Unspecified fracture of right talus, initial encounter for closed fracture: Secondary | ICD-10-CM | POA: Diagnosis not present

## 2023-04-09 DIAGNOSIS — Y990 Civilian activity done for income or pay: Secondary | ICD-10-CM | POA: Insufficient documentation

## 2023-04-09 DIAGNOSIS — R609 Edema, unspecified: Secondary | ICD-10-CM | POA: Diagnosis not present

## 2023-04-09 DIAGNOSIS — S82451A Displaced comminuted fracture of shaft of right fibula, initial encounter for closed fracture: Secondary | ICD-10-CM | POA: Diagnosis not present

## 2023-04-09 DIAGNOSIS — I129 Hypertensive chronic kidney disease with stage 1 through stage 4 chronic kidney disease, or unspecified chronic kidney disease: Secondary | ICD-10-CM | POA: Insufficient documentation

## 2023-04-09 DIAGNOSIS — W19XXXA Unspecified fall, initial encounter: Secondary | ICD-10-CM | POA: Diagnosis not present

## 2023-04-09 DIAGNOSIS — S82891A Other fracture of right lower leg, initial encounter for closed fracture: Secondary | ICD-10-CM

## 2023-04-09 DIAGNOSIS — S99911A Unspecified injury of right ankle, initial encounter: Secondary | ICD-10-CM | POA: Diagnosis present

## 2023-04-09 MED ORDER — PROPOFOL 10 MG/ML IV BOLUS
1.0000 mg/kg | Freq: Once | INTRAVENOUS | Status: DC
  Filled 2023-04-09: qty 20

## 2023-04-09 MED ORDER — HYDROCODONE-ACETAMINOPHEN 5-325 MG PO TABS: 2.0000 | ORAL_TABLET | Freq: Four times a day (QID) | ORAL | 0 refills | Status: DC | PRN

## 2023-04-09 MED ORDER — PROPOFOL 10 MG/ML IV BOLUS
INTRAVENOUS | Status: AC | PRN
  Administered 2023-04-09: 20 mg via INTRAVENOUS
  Administered 2023-04-09: 40 mg via INTRAVENOUS
  Administered 2023-04-09 (×2): 20 mg via INTRAVENOUS

## 2023-04-09 MED ORDER — MORPHINE SULFATE (PF) 4 MG/ML IV SOLN
4.0000 mg | Freq: Once | INTRAVENOUS | Status: AC
  Administered 2023-04-09: 4 mg via INTRAVENOUS
  Filled 2023-04-09: qty 1

## 2023-04-09 MED ORDER — ONDANSETRON HCL 4 MG/2ML IJ SOLN
4.0000 mg | Freq: Once | INTRAMUSCULAR | Status: AC
  Administered 2023-04-09: 4 mg via INTRAVENOUS
  Filled 2023-04-09: qty 2

## 2023-04-09 MED ORDER — SODIUM CHLORIDE 0.9 % IV SOLN
INTRAVENOUS | Status: AC | PRN
Start: 2023-04-09 — End: 2023-04-09
  Administered 2023-04-09: 1000 mL via INTRAVENOUS

## 2023-04-09 MED ORDER — PROPOFOL 10 MG/ML IV BOLUS
1.0000 mg/kg | Freq: Once | INTRAVENOUS | Status: AC
  Administered 2023-04-09: 75 mg via INTRAVENOUS
  Filled 2023-04-09: qty 20

## 2023-04-09 MED ORDER — FENTANYL CITRATE (PF) 100 MCG/2ML IJ SOLN
INTRAMUSCULAR | Status: AC | PRN
Start: 2023-04-09 — End: 2023-04-09
  Administered 2023-04-09: 50 ug via INTRAVENOUS

## 2023-04-09 MED ORDER — FENTANYL CITRATE PF 50 MCG/ML IJ SOSY
50.0000 ug | PREFILLED_SYRINGE | Freq: Once | INTRAMUSCULAR | Status: AC
  Administered 2023-04-09: 50 ug via INTRAVENOUS
  Filled 2023-04-09: qty 1

## 2023-04-09 MED ORDER — LIDOCAINE HCL (PF) 1 % IJ SOLN
10.0000 mL | Freq: Once | INTRAMUSCULAR | Status: AC
  Administered 2023-04-09: 10 mL via INTRADERMAL
  Filled 2023-04-09: qty 10

## 2023-04-09 MED ORDER — PROPOFOL 10 MG/ML IV BOLUS
INTRAVENOUS | Status: AC | PRN
Start: 2023-04-09 — End: 2023-04-09
  Administered 2023-04-09: 50 mg via INTRAVENOUS
  Administered 2023-04-09: 25 mg via INTRAVENOUS

## 2023-04-09 NOTE — ED Provider Notes (Signed)
[place] EMERGENCY DEPARTMENT AT [facility] Provider Note   CSN: [id number] Arrival date & time: 04/09/23  1103     History  Chief Complaint  Patient presents with   [name]    [name] is a 62 y.o. female with a history of hypertension, stage III CKD, and hyperaldosteronism who presents to the ED today via EMS after an Summer injury.  Reports that she was at work and when she got up she slipped on orange juice that was spilled on the floor which caused her to fall.  Patient reports that she landed on her buttocks but only reports Chavez to her right medial Summer.  She states that she was not able to ambulate on her Summer after the event.  Denies hitting her head or loss of consciousness.  She is not taking blood thinners.  No other concerns or complaints at this time.    Home Medications Prior to Admission medications   Medication Sig Start Date End Date Taking? Authorizing Provider  acetaminophen (TYLENOL) 325 MG tablet Take 650 mg by mouth every 6 (six) hours as needed for mild Chavez or headache.    [provider]  amLODipine (NORVASC) 10 MG tablet Take 1 tablet (10 mg total) by mouth daily. 03/07/23   Ellender Hose, NP  atorvastatin (LIPITOR) 40 MG tablet TAKE 1 TABLET(40 MG) BY MOUTH DAILY 03/16/23   [name], FNP  Bismuth/Metronidaz/Tetracyclin [facility]) [id number] MG CAPS Take 3 capsules by mouth 4 (four) times daily -  before meals and at bedtime for 10 days. 02/28/23 03/10/23  Mansouraty, Netty Starring., MD  carvedilol (COREG) 6.25 MG tablet Take 1 tablet (6.25 mg total) by mouth 2 (two) times daily with a meal. 02/23/23   Rai, Ripudeep K, MD  FARXIGA 10 MG TABS tablet Take 1 tablet (10 mg total) by mouth daily. 03/07/23   Ellender Hose, NP  ferrous gluconate (FERGON) 324 MG tablet Take 1 tablet (324 mg total) by mouth daily with breakfast. 02/23/23   Rai, Ripudeep K, MD  gabapentin (NEURONTIN) 100 MG capsule Take 2 capsules (200 mg total) by mouth 2 (two)  times daily. 09/21/22   [name], FNP  hydrALAZINE (APRESOLINE) 50 MG tablet Take 1 tablet (50 mg total) by mouth 3 (three) times daily. 02/23/23   Rai, Delene Ruffini, MD  hydrocortisone (ANUSOL-HC) 25 MG suppository Place 1 suppository (25 mg total) rectally every 12 (twelve) hours. 02/23/23 04/24/23  Rai, Delene Ruffini, MD  isosorbide mononitrate (IMDUR) 60 MG 24 hr tablet Take 1 tablet (60 mg total) by mouth daily. 02/24/23   Rai, Ripudeep K, MD  ondansetron (ZOFRAN) 4 MG tablet Take 1 tablet (4 mg total) by mouth every 8 (eight) hours as needed for nausea or vomiting. 02/23/23 02/23/24  Rai, Delene Ruffini, MD  pantoprazole (PROTONIX) 40 MG tablet Take 1 tablet (40 mg total) by mouth 2 (two) times daily before a meal. Twice a day X 2 months, then continue daily 02/23/23   Rai, Ripudeep K, MD  polyethylene glycol (MIRALAX / GLYCOLAX) 17 g packet Take 17 g by mouth daily.    [provider]  spironolactone (ALDACTONE) 25 MG tablet Take 1 tablet (25 mg total) by mouth daily. 10/23/22   [name], FNP  sucralfate (CARAFATE) 1 g tablet Take 1 tablet (1 g total) by mouth 2 (two) times daily for 14 days. 02/23/23 03/09/23  Cathren Harsh, MD      Allergies    Lisinopril  Review of Systems   Review of Systems  Musculoskeletal:        Right [name]  All other systems reviewed and are negative.   Physical Exam Updated Vital Signs BP (!) 237/92 (BP Location: Right Arm) Comment: MD aware  Pulse 68   Temp 98.1 F (36.7 C) (Oral)   Resp 20   SpO2 99%  Physical Exam Vitals and nursing note reviewed.  Constitutional:      General: She is not in acute distress.    Appearance: Normal appearance.  HENT:     Head: Normocephalic and atraumatic.     Mouth/Throat:     Mouth: Mucous membranes are moist.  Eyes:     Conjunctiva/sclera: Conjunctivae normal.     Pupils: Pupils are equal, round, and reactive to light.  Cardiovascular:     Rate and Rhythm: Normal rate and regular rhythm.     Pulses: Normal  pulses.     Heart sounds: Normal heart sounds.  Pulmonary:     Effort: Pulmonary effort is normal.     Breath sounds: Normal breath sounds.  Abdominal:     Palpations: Abdomen is soft.     Tenderness: There is no abdominal tenderness.  Musculoskeletal:        General: Swelling, tenderness and signs of injury present.     Comments: Swelling and tenderness to the right medial Summer anterior to the medial malleolus. Palpable dorsalis pedis pulses. Sensation intact. Range of motion of toes intact.  Skin:    General: Skin is warm and dry.     Findings: No rash.  Neurological:     General: No focal deficit present.     Mental Status: She is alert.     Sensory: No sensory deficit.     Motor: No weakness.  Psychiatric:        Mood and Affect: Mood normal.        Behavior: Behavior normal.     ED Results / Procedures / Treatments   Labs (all labs ordered are listed, but only abnormal results are displayed) Labs Reviewed - No data to display  EKG None  Radiology DG Summer Complete Right  Result Date: 04/09/2023 CLINICAL DATA:  Right [name] with deformity. Injury. Slipped on liquid flow on floor and twisted Summer. EXAM: RIGHT Summer - COMPLETE 3+ VIEW COMPARISON:  None Available. FINDINGS: There is an acute, comminuted fracture of the distal fibula diaphysis with approximately 34 degree medial apex angulation. There is lateral dislocation of the talus with respect to the distal tibia with widening of the distal tibiofibular clear space. There is an acute transverse fracture of the superior aspect of the medial malleolus with the distal fracture component displaced approximately 2 cm laterally, appearing intact to the talus which is also shifted laterally. Likely old healed fracture of the mid to distal fibular diaphysis. IMPRESSION: 1. Acute, comminuted fracture of the distal fibular diaphysis with approximately 34 degree medial apex angulation. 2. Acute transverse fracture of the superior  aspect of the medial malleolus with the distal fracture component displaced approximately 2 cm laterally. 3. Lateral dislocation of the talus, fractured distal medial malleolus, and fractured distal fibula with respect to the dominant distal tibia. Electronically Signed   By: Neita Garnet M.D.   On: 04/09/2023 13:20    Procedures .Ortho Injury Treatment  Date/Time: 04/09/2023 3:08 PM  Performed by: Maxwell Marion, PA-C Authorized by: Maxwell Marion, PA-C   Consent:    Consent obtained:  Verbal  Consent given by:  Patient   Risks discussed:  Irreducible dislocation   Alternatives discussed:  No treatmentInjury location: Summer Location details: right Summer Anesthesia: hematoma block  Anesthesia: Local anesthesia used: yes Local Anesthetic: lidocaine 1% without epinephrine Immobilization: splint Splint type: short leg Splint Applied by: Ortho Tech Comments: Post-reduction x-ray showed there was still misalignment. Orthopedics coming to set Summer.       Medications Ordered in ED Medications  propofol (DIPRIVAN) 10 mg/mL bolus/IV push 90 mg (has no administration in time range)  morphine (PF) 4 MG/ML injection 4 mg (4 mg Intravenous Given 04/09/23 1152)  lidocaine (PF) (XYLOCAINE) 1 % injection 10 mL (10 mLs Intradermal Given 04/09/23 1312)  morphine (PF) 4 MG/ML injection 4 mg (4 mg Intravenous Given 04/09/23 1346)  ondansetron (ZOFRAN) injection 4 mg (4 mg Intravenous Given 04/09/23 1602)    ED Course/ Medical Decision Making/ A&P Clinical Course as of 04/09/23 1608  Mon Apr 09, 2023  1501 Summer fx/dislocation, tried reduction, needs ortho [JD]  1603 Hematoma block, attempted reduction, unsuccessful. Ortho will be doing a conscious sedation, pending ortho. If successful, discharge with orthopedic follow up. At work and slipped on OJ.  [CG]    Clinical Course User Index [CG] Al Decant, PA-C [JD] Laurence Spates, MD                                 Medical Decision  Making Amount and/or Complexity of Data Reviewed Radiology: ordered.  Risk Prescription drug management.   This patient presents to the ED for concern of right [name], this involves an extensive number of treatment options, and is a complaint that carries with it a high risk of complications and morbidity.   Differential diagnosis includes: fracture, dislocation, sprain, muscle strain, contusion, ligamentous injury, etc.   Comorbidities  See HPI above   Additional History  Additional history obtained from patient's previous records.   Imaging Studies  I ordered imaging studies including right Summer x-ray  I independently visualized and interpreted imaging which showed:  1. Acute, comminuted fracture of the distal fibular diaphysis with approximately 34 degree medial apex angulation. 2. Acute transverse fracture of the superior aspect of the medial malleolus with the distal fracture component displaced approximately 2 cm laterally. 3. Lateral dislocation of the talus, fractured distal medial malleolus, and fractured distal fibula with respect to the dominant distal tibia. I agree with the radiologist interpretation Post-reduction x-ray still showed misalignment. Orthopedics consulted.   Consultations  I requested consultation with orthopedics,  and discussed lab and imaging findings as well as pertinent plan - they recommend: they will come set Summer. Reduction pending at shift change.   Problem List / ED Course / Critical Interventions / Medication Management  Right Summer injury I ordered medications including: Morphine for Chavez  Reevaluation of the patient after these medicines showed that the patient improved I have reviewed the patients home medicines and have made adjustments as needed   Social Determinants of Health  Occupation   Test / Admission - Considered  Care transferred to Jannifer Hick PA-C at shift change.        Final Clinical  Impression(s) / ED Diagnoses Final diagnoses:  Closed fracture of right Summer, initial encounter    Rx / DC Orders ED Discharge Orders     None         Maxwell Marion, PA-C 04/09/23 1628  Elayne Snare K, DO 04/10/23 [phone]

## 2023-04-09 NOTE — ED Triage Notes (Addendum)
Pt arrived via [facility]. Pt was at work and slipped and fell on spilled orange juice and injured right ankle. Per EMS, pt has deformity to right ankle. CNS intact. Pt denies hitting head. Pt denies LOC, pt denies blood thinners. EMS gave fentanyl IV.

## 2023-04-09 NOTE — ED Provider Notes (Signed)
  Physical Exam  BP (!) 201/85   Pulse 64   Temp 98.1 F (36.7 C) (Axillary)   Resp 16   SpO2 99%   Physical Exam Vitals and nursing note reviewed.  Constitutional:      General: She is not in acute distress.    Appearance: She is well-developed.  HENT:     Head: Normocephalic and atraumatic.  Eyes:     Conjunctiva/sclera: Conjunctivae normal.  Cardiovascular:     Rate and Rhythm: Normal rate and regular rhythm.     Heart sounds: No murmur heard. Pulmonary:     Effort: Pulmonary effort is normal. No respiratory distress.     Breath sounds: Normal breath sounds.  Abdominal:     Palpations: Abdomen is soft.     Tenderness: There is no abdominal tenderness.  Musculoskeletal:     Cervical back: Neck supple.     Comments: Obvious deformity to right ankle  Skin:    General: Skin is warm and dry.     Capillary Refill: Capillary refill takes less than 2 seconds.  Neurological:     Mental Status: She is alert.  Psychiatric:        Mood and Affect: Mood normal.     Procedures  Procedures  ED Course / MDM   Clinical Course as of 04/09/23 1849  Mon Apr 09, 2023  1501 Ankle fx/dislocation, tried reduction, needs ortho [JD]  1603 Hematoma block, attempted reduction, unsuccessful. Ortho will be doing a conscious sedation, pending ortho. If successful, discharge with orthopedic follow up. At work and slipped on OJ.  [CG]    Clinical Course User Index [CG] Al Decant, PA-C [JD] Laurence Spates, MD   Medical Decision Making Amount and/or Complexity of Data Reviewed Radiology: ordered.  Risk Prescription drug management.   62 year old female signed out to me at shift change pending reduction, postreduction films.  Please see previous Friday note for further details.  In short, this is a 62 year old female who presents to the ED for evaluation of obvious right ankle fracture.  Patient signed out to me pending orthopedic consultation.  Alexander Bergeron,  PA-C, has seen the patient.  They have reduced the patient's ankle.  This was assisted with my attending Dr. Earlene Plater.  At this time, the patient will follow-up with Dr. Blanchie Dessert tomorrow morning in the office.  I am sending her home with pain medication as well as instructions on RICE therapy.  She was instructed to return to the ED with any new or worsening signs or symptoms.  She has been given orthopedic follow-up information.  Return precautions discussed.  She is stable to discharge at this time.       Al Decant, PA-C 04/09/23 1849    Laurence Spates, MD 04/10/23 [phone]

## 2023-04-09 NOTE — Progress Notes (Signed)
Orthopedic Tech Progress Note Patient Details:  [name] 1961-05-07 [id number]  ED MD reduce ankle and we applied splint   Ortho Devices Type of Ortho Device: Cotton web roll, Ace wrap, Stirrup splint, Short leg splint Ortho Device/Splint Location: RLE Ortho Device/Splint Interventions: Ordered, Application, Adjustment   Post Interventions Patient Tolerated: Well, Fair Instructions Provided: Care of device  [name] 04/09/2023, 2:18 PM

## 2023-04-09 NOTE — ED Notes (Signed)
Consents signed by pt & at bedside for conscious sedation.

## 2023-04-09 NOTE — Discharge Instructions (Addendum)
It was a pleasure taking part in your care today.  As we discussed, you have a fracture of your right ankle.  We have reduced this here in the emergency department.  We are placing you in a splint and giving you crutches.  Please do not apply weight to your right lower extremity until you have seen an orthopedic doctor.  Please follow-up with Dr. Blanchie Dessert tomorrow morning.  Please call his office utilizing the number attached to this form and make an appointment to be seen tomorrow.  Please take prescribed pain medication every 6 hours as needed for pain.  Please do not drive or operate heavy machinery under the influence of pain medication.  Please return to the ED in the event of any increased pain not responsive to pain medication, fevers.

## 2023-04-09 NOTE — ED Notes (Signed)
Ortho at bedside to place splint.

## 2023-04-09 NOTE — Procedures (Signed)
Procedure: Right ankle closed reduction   Indication: Right ankle fracture   Surgeon: Charma Igo, PA-C   Assist: None   Anesthesia: Propofol via EDP   EBL: None   Complications: Inadequate reduction   Findings: After risks/benefits explained patient desires to undergo procedure. Consent obtained and time out performed. Sedation given and efficacy confirmed. Ankle reduced and splinted. Pt tolerated the procedure well but unfortunately post-reduction films still show unacceptable alignment.       Freeman Caldron, PA-C Orthopedic Surgery [phone]

## 2023-04-09 NOTE — Sedation Documentation (Signed)
Family updated as to patient's status.

## 2023-04-09 NOTE — Consult Note (Signed)
Reason for Consult:Right ankle fx Referring Physician: Elayne Snare Time called: 1511 Time at bedside: 1539   [name] is an 62 y.o. female.  HPI: Hava was walking and slipped in some water and fell. She had immediate right ankle pain and could not bear weight. She was brought to the ED where x-rays showed and ankle fx. An attempt was made at reduction with a hematoma block but was unsuccessful and orthopedic surgery was consulted. She lives at home alone  Past Medical History:  Diagnosis Date   Adrenal adenoma 01/11/2021   Anemia    CHF (congestive heart failure) (HCC)    Chronic bronchitis (HCC)    "probably once/yr" (05/12/2013)   Diastolic heart failure    Dyslipidemia    pt denies this hx on 05/12/2013   Exertional shortness of breath    GERD (gastroesophageal reflux disease)    Hypertension    Hypokalemia    Left ventricular hypertrophy 01/11/2021   Venous stasis ulcers (HCC)     Past Surgical History:  Procedure Laterality Date   BIOPSY  02/23/2023   Procedure: BIOPSY;  Surgeon: Lemar Lofty., MD;  Location: [facility] ENDOSCOPY;  Service: Gastroenterology;;   COLONOSCOPY N/A 02/23/2023   Procedure: COLONOSCOPY;  Surgeon: Lemar Lofty., MD;  Location: [facility] ENDOSCOPY;  Service: Gastroenterology;  Laterality: N/A;   ESOPHAGOGASTRODUODENOSCOPY N/A 02/23/2023   Procedure: ESOPHAGOGASTRODUODENOSCOPY (EGD);  Surgeon: Lemar Lofty., MD;  Location: [facility] ENDOSCOPY;  Service: Gastroenterology;  Laterality: N/A;   HOT HEMOSTASIS N/A 02/23/2023   Procedure: HOT HEMOSTASIS (ARGON PLASMA COAGULATION/BICAP);  Surgeon: Lemar Lofty., MD;  Location: [facility] ENDOSCOPY;  Service: Gastroenterology;  Laterality: N/A;   IR US GUIDE VASC ACCESS RIGHT  02/27/2017   IR VENOGRAM ADRENAL BI  02/27/2017   IR VENOGRAM HEPATIC WO HEMODYNAMIC EVALUATION  02/27/2017   IR VENOGRAM RENAL BI  02/27/2017   IR VENOUS SAMPLING  02/27/2017   IR VENOUS SAMPLING  02/27/2017    POLYPECTOMY  02/23/2023   Procedure: POLYPECTOMY;  Surgeon: Mansouraty, Netty Starring., MD;  Location: [facility] ENDOSCOPY;  Service: Gastroenterology;;   VAGINAL HYSTERECTOMY  [phone]    Family History  Problem Relation Age of Onset   Hypertension Mother    Kidney failure Mother    Heart attack Father    Hypertension Sister    Diabetes Sister    Hypertension Brother    Diabetes Sister     Social History:  reports that she has been smoking cigarettes. She has a 5 pack-year smoking history. She has never used smokeless tobacco. She reports that she does not currently use alcohol after a past usage of about 7.0 standard drinks of alcohol per week. She reports that she does not use drugs.  Allergies:  Allergies  Allergen Reactions   Lisinopril Cough    Medications: I have reviewed the patient's current medications.  No results found for this or any previous visit (from the past 48 hour(s)).  DG Ankle Complete Right  Result Date: 04/09/2023 CLINICAL DATA:  Right ankle pain with deformity. Injury. Slipped on liquid flow on floor and twisted ankle. EXAM: RIGHT ANKLE - COMPLETE 3+ VIEW COMPARISON:  None Available. FINDINGS: There is an acute, comminuted fracture of the distal fibula diaphysis with approximately 34 degree medial apex angulation. There is lateral dislocation of the talus with respect to the distal tibia with widening of the distal tibiofibular clear space. There is an acute transverse fracture of the superior aspect of the medial malleolus with the distal fracture  component displaced approximately 2 cm laterally, appearing intact to the talus which is also shifted laterally. Likely old healed fracture of the mid to distal fibular diaphysis. IMPRESSION: 1. Acute, comminuted fracture of the distal fibular diaphysis with approximately 34 degree medial apex angulation. 2. Acute transverse fracture of the superior aspect of the medial malleolus with the distal fracture component displaced  approximately 2 cm laterally. 3. Lateral dislocation of the talus, fractured distal medial malleolus, and fractured distal fibula with respect to the dominant distal tibia. Electronically Signed   By: Neita Garnet M.D.   On: 04/09/2023 13:20    Review of Systems  HENT:  Negative for ear discharge, ear pain, hearing loss and tinnitus.   Eyes:  Negative for photophobia and pain.  Respiratory:  Negative for cough and shortness of breath.   Cardiovascular:  Negative for chest pain.  Gastrointestinal:  Negative for abdominal pain, nausea and vomiting.  Genitourinary:  Negative for dysuria, flank pain, frequency and urgency.  Musculoskeletal:  Positive for arthralgias (Right ankle). Negative for back pain, myalgias and neck pain.  Neurological:  Negative for dizziness and headaches.  Hematological:  Does not bruise/bleed easily.  Psychiatric/Behavioral:  The patient is not nervous/anxious.    Blood pressure (!) 237/92, pulse 68, temperature 98.1 F (36.7 C), temperature source Oral, resp. rate 20, SpO2 99%. Physical Exam Constitutional:      General: She is not in acute distress.    Appearance: She is well-developed. She is not diaphoretic.  HENT:     Head: Normocephalic and atraumatic.  Eyes:     General: No scleral icterus.       Right eye: No discharge.        Left eye: No discharge.     Conjunctiva/sclera: Conjunctivae normal.  Cardiovascular:     Rate and Rhythm: Normal rate and regular rhythm.  Pulmonary:     Effort: Pulmonary effort is normal. No respiratory distress.  Musculoskeletal:     Cervical back: Normal range of motion.     Comments: RLE No traumatic wounds, ecchymosis, or rash  Short leg splint in place  No knee effusion  Knee stable to varus/ valgus and anterior/posterior stress  Sens DPN, SPN, TN intact  Motor EHL 5/5  Toes perfused, No significant edema  Skin:    General: Skin is warm and dry.  Neurological:     Mental Status: She is alert.  Psychiatric:         Mood and Affect: Mood normal.        Behavior: Behavior normal.    Assessment/Plan: Right ankle fx -- Will plan to reduce and splint via CS. Reduction unsuccessful so Dr. Blanchie Dessert will see and attempt.    Freeman Caldron, PA-C Orthopedic Surgery [phone] 04/09/2023, 3:46 PM

## 2023-04-09 NOTE — ED Notes (Signed)
Patient Blood pressure is elevated patient okay with going home with same states she will take medication when she gets home for the B/P. Provider is aware of elevated B/P and is okay with patient going home and taking mediations at home.

## 2023-04-10 DIAGNOSIS — S82841A Displaced bimalleolar fracture of right lower leg, initial encounter for closed fracture: Secondary | ICD-10-CM | POA: Diagnosis not present

## 2023-04-10 DIAGNOSIS — M25571 Pain in right ankle and joints of right foot: Secondary | ICD-10-CM | POA: Diagnosis not present

## 2023-04-10 NOTE — Progress Notes (Signed)
Patient interviewed over the phone. Labs to be done DOS. Instructions given verbally (no email). Instructed patient to bathe with antibacterial soap and call back with any questions.  COVID Vaccine Completed: yes  Date of COVID positive in last 90 days: no  PCP - [name], FNP Cardiologist - Chilton Si, MD LOV 01/11/21 Nephrologist- Dr. Thedore Mins  Chest x-ray - n/a EKG - 02/19/23 Epic Stress Test - n/a ECHO - 09/06/18 Epic Cardiac Cath - n/a Pacemaker/ICD device last checked: n/a Spinal Cord Stimulator: n/a  Bowel Prep - no  Sleep Study - n/a CPAP -   Fasting Blood Sugar - n/a Checks Blood Sugar _____ times a day  Hold Farxiga 3 days prior. Last dose 04/14/23  Last dose of GLP1 agonist-  N/A GLP1 instructions:  N/A   Last dose of SGLT-2 inhibitors-  N/A SGLT-2 instructions: N/A   Blood Thinner Instructions: n/a Aspirin Instructions: Last Dose:  Activity level: Can perform activities of daily living without stopping and without symptoms of chest pain or shortness of breath. No stairs due to ankle injury   Anesthesia review: HTN, varicose veins, CKD, anemia, CP, heart murmur, CHF  Patient denies shortness of breath, fever, cough and chest pain at PAT appointment  Patient verbalized understanding of instructions that were given to them at the PAT appointment. Patient was also instructed that they will need to review over the PAT instructions again at home before surgery.

## 2023-04-10 NOTE — ED Provider Notes (Signed)
.  Sedation  Date/Time: 04/10/2023 2:53 PM  Performed by: Laurence Spates, MD Authorized by: Laurence Spates, MD   Consent:    Consent obtained:  Written   Consent given by:  Patient   Risks discussed:  Allergic reaction, dysrhythmia, inadequate sedation, nausea, vomiting, respiratory compromise necessitating ventilatory assistance and intubation and prolonged hypoxia resulting in organ damage   Alternatives discussed:  Analgesia without sedation Universal protocol:    Immediately prior to procedure, a time out was called: yes     Patient identity confirmed:  Hospital-assigned identification number and verbally with patient Indications:    Procedure performed:  Fracture reduction   Procedure necessitating sedation performed by:  Different physician Pre-sedation assessment:    Time since last food or drink:  9 hours   ASA classification: class 2 - patient with mild systemic disease     Mouth opening:  3 or more finger widths   Thyromental distance:  4 finger widths   Mallampati score:  II - soft palate, uvula, fauces visible   Neck mobility: normal     Pre-sedation assessments completed and reviewed: airway patency, cardiovascular function, hydration status, mental status, nausea/vomiting, pain level, respiratory function and temperature   Immediate pre-procedure details:    Reassessment: Patient reassessed immediately prior to procedure     Reviewed: vital signs, relevant labs/tests and NPO status     Verified: bag valve mask available, emergency equipment available, intubation equipment available, IV patency confirmed, oxygen available, reversal medications available and suction available   Procedure details (see MAR for exact dosages):    Preoxygenation:  Room air   Sedation:  Propofol   Intended level of sedation: deep   Analgesia:  Fentanyl   Intra-procedure monitoring:  Blood pressure monitoring, cardiac monitor, continuous pulse oximetry, continuous capnometry, frequent vital  sign checks and frequent LOC assessments   Intra-procedure events: none     Total Provider sedation time (minutes):  20 Post-procedure details:    Attendance: Constant attendance by certified staff until patient recovered     Recovery: Patient returned to pre-procedure baseline     Post-sedation assessments completed and reviewed: airway patency, cardiovascular function, hydration status, mental status, nausea/vomiting, pain level, respiratory function and temperature     Patient is stable for discharge or admission: yes     Procedure completion:  Tolerated well, no immediate complications     Laurence Spates, MD 04/10/23 1455

## 2023-04-11 ENCOUNTER — Encounter (HOSPITAL_COMMUNITY): Payer: Self-pay

## 2023-04-11 ENCOUNTER — Encounter (HOSPITAL_COMMUNITY)
Admission: RE | Admit: 2023-04-11 | Discharge: 2023-04-11 | Disposition: A | Discharge status: Home / Self Care | Payer: 59 | Source: Ambulatory Visit | Attending: Orthopedic Surgery | Admitting: Orthopedic Surgery

## 2023-04-11 ENCOUNTER — Telehealth: Payer: Self-pay

## 2023-04-11 ENCOUNTER — Other Ambulatory Visit: Payer: Self-pay

## 2023-04-11 HISTORY — DX: Chronic kidney disease, unspecified: N18.9

## 2023-04-11 HISTORY — DX: Cardiac murmur, unspecified: R01.1

## 2023-04-11 NOTE — Transitions of Care (Post Inpatient/ED Visit) (Signed)
   04/11/2023  Name: Lorenza Propes MRN: [id number] DOB: 1960/09/12  Today's TOC FU Call Status: Today's TOC FU Call Status:: Unsuccessful Call (1st Attempt) Unsuccessful Call (1st Attempt) Date: 04/11/23  Attempted to reach the patient regarding the most recent Inpatient/ED visit.  Follow Up Plan: Additional outreach attempts will be made to reach the patient to complete the Transitions of Care (Post Inpatient/ED visit) call.   Signature YL,RMA

## 2023-04-11 NOTE — Patient Instructions (Addendum)
SURGICAL WAITING ROOM VISITATION  Patients having surgery or a procedure may have no more than 2 support people in the waiting area - these visitors may rotate.    Children under the age of 44 must have an adult with them who is not the patient.  Due to an increase in RSV and influenza rates and associated hospitalizations, children ages 31 and under may not visit patients in [facility] hospitals.  If the patient needs to stay at the hospital during part of their recovery, the visitor guidelines for inpatient rooms apply. Pre-op nurse will coordinate an appropriate time for 1 support person to accompany patient in pre-op.  This support person may not rotate.    Please refer to the [facility] website for the visitor guidelines for Inpatients (after your surgery is over and you are in a regular room).    Your procedure is scheduled on: 04/18/23   Report to [facility] Main Entrance    Report to admitting at 10:45 AM   Call this number if you have problems the morning of surgery [phone]   Do not eat food :After Midnight.   After Midnight you may have the following liquids until 10:00 AM DAY OF SURGERY  Water Non-Citrus Juices (without pulp, NO RED-Apple, White grape, White cranberry) Black Coffee (NO MILK/CREAM OR CREAMERS, sugar ok)  Clear Tea (NO MILK/CREAM OR CREAMERS, sugar ok) regular and decaf                             Plain Jell-O (NO RED)                                           Fruit ices (not with fruit pulp, NO RED)                                     Popsicles (NO RED)                                                               Sports drinks like Gatorade (NO RED)     The day of surgery:  Drink ONE (1) Pre-Surgery Clear Ensure at 10:00 AM the morning of surgery. Drink in one sitting. Do not sip.  This drink was given to you during your hospital  pre-op appointment visit. Nothing else to drink after completing the  Pre-Surgery Clear Ensure or G2.           If you have questions, please contact your surgeon's office.   FOLLOW BOWEL PREP AND ANY ADDITIONAL PRE OP INSTRUCTIONS YOU RECEIVED FROM YOUR SURGEON'S OFFICE!!!     Oral Hygiene is also important to reduce your risk of infection.                                    Remember - BRUSH YOUR TEETH THE MORNING OF SURGERY WITH YOUR REGULAR TOOTHPASTE  DENTURES WILL BE REMOVED PRIOR TO SURGERY PLEASE DO NOT APPLY "Poly  grip" OR ADHESIVES!!!   Do NOT smoke after Midnight   Stop all vitamins and herbal supplements 7 days before surgery.   Take these medicines the morning of surgery with A SIP OF WATER: Tylenol, Amlodipine, Atorvastatin, Carvedilol, Gabapentin, Hydralazine, Norco, IMDUR, Zofran                              You may not have any metal on your body including hair pins, jewelry, and body piercing             Do not wear make-up, lotions, powders, perfumes, or deodorant  Do not wear nail polish including gel and S&S, artificial/acrylic nails, or any other type of covering on natural nails including finger and toenails. If you have artificial nails, gel coating, etc. that needs to be removed by a nail salon please have this removed prior to surgery or surgery may need to be canceled/ delayed if the surgeon/ anesthesia feels like they are unable to be safely monitored.   Do not shave  48 hours prior to surgery.    Do not bring valuables to the hospital. [facility] IS NOT             RESPONSIBLE   FOR VALUABLES.   Contacts, glasses, dentures or bridgework may not be worn into surgery.  DO NOT BRING YOUR HOME MEDICATIONS TO THE HOSPITAL. PHARMACY WILL DISPENSE MEDICATIONS LISTED ON YOUR MEDICATION LIST TO YOU DURING YOUR ADMISSION IN THE HOSPITAL!    Patients discharged on the day of surgery will not be allowed to drive home.  Someone NEEDS to stay with you for the first 24 hours after anesthesia.              Please read over the following fact sheets you were given: IF  YOU HAVE QUESTIONS ABOUT YOUR PRE-OP INSTRUCTIONS PLEASE CALL [name]    If you received a COVID test during your pre-op visit  it is requested that you wear a mask when out in public, stay away from anyone that may not be feeling well and notify your surgeon if you develop symptoms. If you test positive for Covid or have been in contact with anyone that has tested positive in the last 10 days please notify you surgeon.     - Preparing for Surgery Before surgery, you can play an important role.  Because skin is not sterile, your skin needs to be as free of germs as possible.  You can reduce the number of germs on your skin by washing with CHG (chlorahexidine gluconate) soap before surgery.  CHG is an antiseptic cleaner which kills germs and bonds with the skin to continue killing germs even after washing. Please DO NOT use if you have an allergy to CHG or antibacterial soaps.  If your skin becomes reddened/irritated stop using the CHG and inform your nurse when you arrive at Short Stay. Do not shave (including legs and underarms) for at least 48 hours prior to the first CHG shower.  You may shave your face/neck.  Please follow these instructions carefully:  1.  Shower with CHG Soap the night before surgery and the  morning of surgery.  2.  If you choose to wash your hair, wash your hair first as usual with your normal  shampoo.  3.  After you shampoo, rinse your hair and body thoroughly to remove the shampoo.  4.  Use CHG as you would any other liquid soap.  You can apply chg directly to the skin and wash.  Gently with a scrungie or clean washcloth.  5.  Apply the CHG Soap to your body ONLY FROM THE NECK DOWN.   Do   not use on face/ open                           Wound or open sores. Avoid contact with eyes, ears mouth and   genitals (private parts).                       Wash face,  Genitals (private parts) with your normal soap.             6.   Wash thoroughly, paying special attention to the area where your    surgery  will be performed.  7.  Thoroughly rinse your body with warm water from the neck down.  8.  DO NOT shower/wash with your normal soap after using and rinsing off the CHG Soap.                9.  Pat yourself dry with a clean towel.            10.  Wear clean pajamas.            11.  Place clean sheets on your bed the night of your first shower and do not  sleep with pets. Day of Surgery : Do not apply any lotions/deodorants the morning of surgery.  Please wear clean clothes to the hospital/surgery center.  FAILURE TO FOLLOW THESE INSTRUCTIONS MAY RESULT IN THE CANCELLATION OF YOUR SURGERY  PATIENT SIGNATURE_________________________________  NURSE SIGNATURE__________________________________  ________________________________________________________________________  [name]  An incentive spirometer is a tool that can help keep your lungs clear and active. This tool measures how well you are filling your lungs with each breath. Taking long deep breaths may help reverse or decrease the chance of developing breathing (pulmonary) problems (especially infection) following: A long period of time when you are unable to move or be active. BEFORE THE PROCEDURE  If the spirometer includes an indicator to show your best effort, your nurse or respiratory therapist will set it to a desired goal. If possible, sit up straight or lean slightly forward. Try not to slouch. Hold the incentive spirometer in an upright position. INSTRUCTIONS FOR USE  Sit on the edge of your bed if possible, or sit up as far as you can in bed or on a chair. Hold the incentive spirometer in an upright position. Breathe out normally. Place the mouthpiece in your mouth and seal your lips tightly around it. Breathe in slowly and as deeply as possible, raising the piston or the ball toward the top of the column. Hold your breath for 3-5 seconds  or for as long as possible. Allow the piston or ball to fall to the bottom of the column. Remove the mouthpiece from your mouth and breathe out normally. Rest for a few seconds and repeat Steps 1 through 7 at least 10 times every 1-2 hours when you are awake. Take your time and take a few normal breaths between deep breaths. The spirometer may include an indicator to show your best effort. Use the indicator as a goal to work toward during each repetition. After each set of 10 deep breaths, practice coughing to be sure your  lungs are clear. If you have an incision (the cut made at the time of surgery), support your incision when coughing by placing a pillow or rolled up towels firmly against it. Once you are able to get out of bed, walk around indoors and cough well. You may stop using the incentive spirometer when instructed by your caregiver.  RISKS AND COMPLICATIONS Take your time so you do not get dizzy or light-headed. If you are in pain, you may need to take or ask for pain medication before doing incentive spirometry. It is harder to take a deep breath if you are having pain. AFTER USE Rest and breathe slowly and easily. It can be helpful to keep track of a log of your progress. Your caregiver can provide you with a simple table to help with this. If you are using the spirometer at home, follow these instructions: SEEK MEDICAL CARE IF:  You are having difficultly using the spirometer. You have trouble using the spirometer as often as instructed. Your pain medication is not giving enough relief while using the spirometer. You develop fever of 100.5 F (38.1 C) or higher. SEEK IMMEDIATE MEDICAL CARE IF:  You cough up bloody sputum that had not been present before. You develop fever of 102 F (38.9 C) or greater. You develop worsening pain at or near the incision site. MAKE SURE YOU:  Understand these instructions. Will watch your condition. Will get help right away if you are not doing  well or get worse. Document Released: 12/18/2006 Document Revised: 10/30/2011 Document Reviewed: 02/18/2007 [facility] Patient Information 2014 [place], [place].   ________________________________________________________________________

## 2023-04-11 NOTE — Telephone Encounter (Signed)
-----   Message from Briant Cedar sent at 04/11/2023  8:16 AM EDT ----- Regarding: FW: Epo injection Can you reach out to patient to advise her to follow up with her nephrologist (Dr. Thedore Mins). His team is trying to reach her to arrange for Epo injections. ----- Message ----- From: Anthony Sar, MD Sent: 03/29/2023  11:34 AM EDT To: Jaci Standard, MD; Briant Cedar, PA-C Subject: RE: Epo injection                              Unfortunately, our staff cannot get in touch with her. We may need help getting Epo set up for her if anyone in your staff can reach her. Sorry about that ----- Message ----- From: Briant Cedar, [place] Sent: 03/19/2023   2:05 PM EDT To: Anthony Sar, MD; Jaci Standard, MD Subject: Epo injection                                  Dr. Thedore Mins,  Dr. Leonides Schanz and I saw Ms. Trimarco for evaluation for microcytic anemia. Her iron levels have improved and don't require IV iron at this time. We recommend to continue on PO iron therapy.   Due to her CKD, we suggest Epo injections to boost her Hgb levels. Would your team be able to give the Epo injections?   Thanks, Karena Addison

## 2023-04-11 NOTE — Telephone Encounter (Signed)
T/C to pt to advise her that Dr Doristine Church office has been trying to reach her to set up Epo injections.  Pt fell at work on Monday 8/19 and broke her ankle in three places.  She is scheduled for surgery on Wed 8/28 at [facility]. Pt said she had returned the call to Dr Marin Comment office but no one answered.  She will call them again today.

## 2023-04-12 ENCOUNTER — Telehealth: Payer: Self-pay

## 2023-04-12 ENCOUNTER — Encounter (HOSPITAL_COMMUNITY): Payer: Self-pay

## 2023-04-12 NOTE — Progress Notes (Signed)
Case: [id number] Date/Time: 04/18/23 1314   Procedure: OPEN REDUCTION INTERNAL FIXATION (ORIF) ANKLE FRACTURE (Right: Ankle)   Anesthesia type: Choice   Pre-op diagnosis: RIGHT ANKLE FRACTURE   [facility] ROOM 09 / [facility]   Surgeons: Joen Laura, MD       DISCUSSION: [name] is a 62 yo female who is being evaluated prior to ORIF of right ankle fx on 04/18/23 with Dr. Cecilie Lowers. PMH significant for every day smoker, HTN, HFpEF, severe LVH, hypertrophic cardiomyopathy, HLD, hx of hyperaldosteronism, GERD, CKD stage 3, chronic anemia, hx of substance abuse (cocaine)  Patient was admitted from 7/2-7/5 due to symptomatic anemia requiring a blood transfusion and IV iron. Also noted to have uncontrolled HTN and blood pressure medicines were adjusted while inpatient. GI was consulted however there was no obvious GI bleeding. EGD showed gastritis and angiodysplastic lesion on the anterior wall of the gastric body status post argon plasma ablation, localized moderate inflammation characterized by erosions, erythema and linear erosions in the gastric body and antrum, patchy mildly congested mucosa without active bleeding and with no stigmata of bleeding in duodenum. Colonoscopy showed normal mucosa in the entire colon, nonbleeding nonthrombosed external and internal hemorrhoids. She was placed on BID PPI and Carafate.  After discharge she has followed up with her PCP on 03/07/23. BP noted to be better controlled. A referral to Cardiology has been placed due to her hx of CHF and being lost to follow up since 2022. She follows with nephrology for her CKD. Creatinine at her PCP's office was 2.45 on 03/09/23 which is around her baseline.  She fell on 8/19 leading to an ankle fracture. She underwent sedation and reduction in the ED without issues.  Seen in PAT on 8/21. Cardiac clearance was requested. Pt was seen in Cardiology clinic on 8/26. Per Dan Humphreys, NP: "Preoperative clearance - Pending  ORIF of right ankle fracture. EKG today no acute ST/T wave changes. Exercise tolerance >4 METS. Per AHA/ACC guidelines, she is deemed acceptable risk for the planned procedure without additional cardiovascular testing. Will route to surgical team so they are aware." She was advised to f/u in 4-6 weeks to discuss her resistant HTN and adrenal adenoma.  VS: Ht 5\' 8"  (1.727 m)   Wt 97.5 kg   BMI 32.69 kg/m   PROVIDERS: [name], FNP [facility], Dr. Thedore Mins  Cardiology: Chilton Si, MD  LABS: Obtain DOS (all labs ordered are listed, but only abnormal results are displayed)  Labs Reviewed - No data to display   IMAGES:   EKG 04/16/23:  NSR, rate 88 LVH  CV:  MRI Cardiac morphology 02/18/2021:  IMPRESSION: 1. Severe asymmetric basal-mid LV septal hypertrophy. maximal septal thickness 19mm.   2.  Normal LV and RV size and function.  LVEF 61%   3.  No evidence for late gadolinium enhancement or scar.   4.  No left ventricular outflow tract obstruction.   5. Findings consistent with HCM, asymmetric septal hypertrophy variant.   Arlys John Agbor-Etang    Echo 09/06/2018:  Study Conclusions   - Left ventricle: The cavity size was normal. Wall thickness was    increased in a pattern of severe LVH. Systolic function was    normal. The estimated ejection fraction was in the range of 60%    to 65%. Wall motion was normal; there were no regional wall    motion abnormalities. Doppler parameters are consistent with    abnormal left ventricular relaxation (grade 1 diastolic  dysfunction).  - Aortic valve: There was no stenosis. There was trivial    regurgitation.  - Mitral valve: There was trivial regurgitation.  - Left atrium: The atrium was moderately dilated.  - Right ventricle: The cavity size was normal. Systolic function    was normal.  - Right atrium: The atrium was mildly dilated.  - Pulmonary arteries: No complete TR doppler jet so unable to     estimate PA systolic pressure.  - Inferior vena cava: The vessel was normal in size. The    respirophasic diameter changes were in the normal range (>= 50%),    consistent with normal central venous pressure.   Impressions:   - Normal LV size with severe LV hypertrophy. EF 60-65%. Moderate    LAE. Normal RV size and systolic function. No significant    valvular abnormalities. Cardiac amyloidosis is a consideration.   Past Medical History:  Diagnosis Date   Adrenal adenoma 01/11/2021   Anemia    CHF (congestive heart failure) (HCC)    Chronic bronchitis (HCC)    "probably once/yr" (05/12/2013)   Chronic kidney disease    Diastolic heart failure    Dyslipidemia    pt denies this hx on 05/12/2013   Exertional shortness of breath    GERD (gastroesophageal reflux disease)    Heart murmur    Hypertension    Hypokalemia    Left ventricular hypertrophy 01/11/2021   Venous stasis ulcers (HCC)     Past Surgical History:  Procedure Laterality Date   BIOPSY  02/23/2023   Procedure: BIOPSY;  Surgeon: Lemar Lofty., MD;  Location: [facility] ENDOSCOPY;  Service: Gastroenterology;;   COLONOSCOPY N/A 02/23/2023   Procedure: COLONOSCOPY;  Surgeon: Lemar Lofty., MD;  Location: [facility] ENDOSCOPY;  Service: Gastroenterology;  Laterality: N/A;   ESOPHAGOGASTRODUODENOSCOPY N/A 02/23/2023   Procedure: ESOPHAGOGASTRODUODENOSCOPY (EGD);  Surgeon: Lemar Lofty., MD;  Location: [facility] ENDOSCOPY;  Service: Gastroenterology;  Laterality: N/A;   HOT HEMOSTASIS N/A 02/23/2023   Procedure: HOT HEMOSTASIS (ARGON PLASMA COAGULATION/BICAP);  Surgeon: Lemar Lofty., MD;  Location: [facility] ENDOSCOPY;  Service: Gastroenterology;  Laterality: N/A;   IR US GUIDE VASC ACCESS RIGHT  02/27/2017   IR VENOGRAM ADRENAL BI  02/27/2017   IR VENOGRAM HEPATIC WO HEMODYNAMIC EVALUATION  02/27/2017   IR VENOGRAM RENAL BI  02/27/2017   IR VENOUS SAMPLING  02/27/2017   IR VENOUS SAMPLING  02/27/2017   POLYPECTOMY   02/23/2023   Procedure: POLYPECTOMY;  Surgeon: Lemar Lofty., MD;  Location: [facility] ENDOSCOPY;  Service: Gastroenterology;;   VAGINAL HYSTERECTOMY  [phone]    MEDICATIONS:  acetaminophen (TYLENOL) 325 MG tablet   amLODipine (NORVASC) 10 MG tablet   atorvastatin (LIPITOR) 40 MG tablet   carvedilol (COREG) 6.25 MG tablet   FARXIGA 10 MG TABS tablet   ferrous gluconate (FERGON) 324 MG tablet   gabapentin (NEURONTIN) 100 MG capsule   hydrALAZINE (APRESOLINE) 50 MG tablet   HYDROcodone-acetaminophen (NORCO/VICODIN) 5-325 MG tablet   hydrocortisone (ANUSOL-HC) 25 MG suppository   isosorbide mononitrate (IMDUR) 60 MG 24 hr tablet   ondansetron (ZOFRAN) 4 MG tablet   polyethylene glycol (MIRALAX / GLYCOLAX) 17 g packet   spironolactone (ALDACTONE) 25 MG tablet   No current facility-administered medications for this encounter.   Marcille Blanco [facility] Surgical Short Stay/Anesthesiology [facility] Phone [phone] 04/12/2023 11:02 AM

## 2023-04-12 NOTE — Telephone Encounter (Addendum)
   Pre-operative Risk Assessment    Patient Name: [name]  DOB: 29-Nov-1960 MRN: [id number]   LAST APPOINTMENT: 01/11/2021 [place] NEXT APPOINTMENT: 05/18/23 [facility]   Request for Surgical Clearance:    Procedure:   URGENT ORIF RIGHT ANKLE  Date of Surgery:  Clearance 04/18/23                                 Surgeon:  Weber Cooks, MD Surgeon's Group or Practice Name:  [facility] Phone number:  [phone] Fax number:  [phone]   Type of Clearance Requested:   - Medical    Type of Anesthesia:   CHOICE   Additional requests/questions:   Hospital cases: All cases will have a hospital PAT appointment w/labs per anesthesia protocol 10-14 days prior to surgery. We will manage Hgb A1Cpm. Order additional labs if needed (labs valid for 3 months).    Rondel Baton   04/12/2023, 3:59 PM

## 2023-04-12 NOTE — Telephone Encounter (Signed)
Spoke with the patient and she sttes she would like to be seen sooner. Scheduled an appt with [name] on 04/16/23 at 2:45 patient agreeable and voiced understanding.

## 2023-04-12 NOTE — Transitions of Care (Post Inpatient/ED Visit) (Signed)
   04/12/2023  Name: Countess Dewey MRN: [id number] DOB: 12/01/1960  Today's TOC FU Call Status: Today's TOC FU Call Status:: Successful TOC FU Call Completed TOC FU Call Complete Date: 04/12/23  Transition Care Management Follow-up Telephone Call Date of Discharge: 04/09/23 Discharge Facility: [facility]) Type of Discharge: Emergency Department Reason for ED Visit: Other: How have you been since you were released from the hospital?: Same Any questions or concerns?: No  Items Reviewed: Did you receive and understand the discharge instructions provided?: Yes Medications obtained,verified, and reconciled?: Yes (Medications Reviewed) Any new allergies since your discharge?: No Dietary orders reviewed?: No Do you have support at home?: Yes People in Home: child(ren), adult  Medications Reviewed Today: Medications Reviewed Today   Medications were not reviewed in this encounter     Home Care and Equipment/Supplies: Were Home Health Services Ordered?: No Any new equipment or medical supplies ordered?: No  Functional Questionnaire: Do you need assistance with bathing/showering or dressing?: No Do you need assistance with meal preparation?: No Do you need assistance with eating?: No Do you have difficulty maintaining continence: No Do you need assistance with getting out of bed/getting out of a chair/moving?: No Do you have difficulty managing or taking your medications?: No  Follow up appointments reviewed: PCP Follow-up appointment confirmed?: No MD Provider Line [phone] Given: Yes Specialist Hospital Follow-up appointment confirmed?: Yes Follow-Up Specialty Provider:: ortho Do you need transportation to your follow-up appointment?: No Do you understand care options if your condition(s) worsen?: Yes-patient verbalized understanding    SIGNATURE Lisabeth Devoid, CMA

## 2023-04-12 NOTE — Telephone Encounter (Signed)
   Name: [name]  DOB: Sep 04, 1960  MRN: [id number]  Primary Cardiologist: Chilton Si, MD  Chart reviewed as part of pre-operative protocol coverage. Because of [name]'s past medical history and time since last visit, she will require a follow-up in-office visit in order to better assess preoperative cardiovascular risk.  Patient is scheduled to see Luther Parody on 05/12/2023 by clearances for 04/18/2023  Pre-op covering staff: - Please schedule appointment and call patient to inform them. If patient already had an upcoming appointment within acceptable timeframe, please add "pre-op clearance" to the appointment notes so provider is aware. - Please contact requesting surgeon's office via preferred method (i.e, phone, fax) to inform them of need for appointment prior to surgery.    Napoleon Form, Leodis Rains, NP  04/12/2023, 4:35 PM

## 2023-04-16 ENCOUNTER — Encounter (HOSPITAL_BASED_OUTPATIENT_CLINIC_OR_DEPARTMENT_OTHER): Payer: Self-pay | Admitting: Family

## 2023-04-16 ENCOUNTER — Ambulatory Visit (INDEPENDENT_AMBULATORY_CARE_PROVIDER_SITE_OTHER): Payer: 59 | Admitting: Family

## 2023-04-16 VITALS — BP 162/76 | HR 88 | Ht 68.0 in | Wt 221.0 lb

## 2023-04-16 DIAGNOSIS — E782 Mixed hyperlipidemia: Secondary | ICD-10-CM | POA: Diagnosis not present

## 2023-04-16 DIAGNOSIS — Z01818 Encounter for other preprocedural examination: Secondary | ICD-10-CM | POA: Diagnosis not present

## 2023-04-16 DIAGNOSIS — I10 Essential (primary) hypertension: Secondary | ICD-10-CM

## 2023-04-16 DIAGNOSIS — D3501 Benign neoplasm of right adrenal gland: Secondary | ICD-10-CM | POA: Diagnosis not present

## 2023-04-16 DIAGNOSIS — I422 Other hypertrophic cardiomyopathy: Secondary | ICD-10-CM | POA: Diagnosis not present

## 2023-04-16 DIAGNOSIS — I517 Cardiomegaly: Secondary | ICD-10-CM

## 2023-04-16 NOTE — Progress Notes (Signed)
Cardiology Office Note:  .   Date:  04/16/2023  ID:  [name], DOB 03-25-1961, MRN [id number] PCP: [name], FNP  Nephrologist: Dr. Dwana [facility] Providers Cardiologist:  Chilton Si, MD    History of Present Illness: .   [name] is a 62 y.o. female with hx of chronic diastolic heart failure, chronic bronchitis, GERD, HTN, HLD, venous stasis ulcers, tobacco use, anemia, prior cocaine use, tobacco use, LVH, HCM.   [facility]/11[facility]. Prior echo 08/2018 LVEF 60-65%, gr1DD, severe LVH. Hypertension diagnosed in her 62s. Prior workup 2018 noted adrenal adenoma with labs consistent with hyperaldosteronism. At her initial Hypertension Clinic visit she was encouraged to start Spironolactone as previously prescribed and follow up with Dr. Othella Boyer at [facility] for removal of adrenal adenoma. cMRI 02/2021 to rule out amyloidosis due to LVH with severe asymmetric basal-mid LV septal hypertrophy, normal LV and RV size/function, findings consistent with HCM.  Presents today for follow up. Lost to follow up since initial visit 01/11/21. 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 She was at work, fell on OJ and now pending ORIF of ankle fracture. Notes surgical team has her holding Spironolactone, Coreg, Imdur prior to her procedure for 5 days which is likely cause of her elevated BP in clinic. I do question whether she misunderstood and was not truly to hold these medications. Presently using a wheelchair. Prior to fall no restriction in mobility. BP at home 130-150/70-80s per her report. Did not proceed with removal of adrenal adenoma as had difficulty with getting to appointment with [facility].   Previous antihypertensives Lisinopril  ROS: Please see the history of present illness.    All other systems reviewed and are negative.   Studies Reviewed: Marland Kitchen   EKG Interpretation Date/Time:  Monday April 16 2023 14:53:18  EDT Ventricular Rate:  88 PR Interval:  208 QRS Duration:  90 QT Interval:  368 QTC Calculation: 445 R Axis:   -27  Text Interpretation: Normal sinus rhythm Left ventricular hypertrophy with repolarization abnormality ( R in aVL , Cornell product ) Confirmed by Gillian Shields ([id number]) on 04/16/2023 2:57:48 PM    Cardiac Studies & Procedures       ECHOCARDIOGRAM  ECHOCARDIOGRAM COMPLETE 09/06/2018  Narrative *[place]* *[facility]* [address] [place], [place] [address] [phone]  ------------------------------------------------------------------- Transthoracic Echocardiography  Patient:    Summer, Chavez MR #:       [id number] Study Date: 09/06/2018 Gender:     F Age:        72 Height:     172.7 cm Weight:     104.1 kg BSA:        2.27 m^2 Pt. Status: Room:       4E15C  ATTENDING    Cathren Laine [id number] PERFORMING   [facility], Inpatient ADMITTING    Bubba Hales, Vasundhra REFERRING    Rathore, Vasundhra SONOGRAPHER  Ilona Sorrel  cc:  ------------------------------------------------------------------- LV EF: 60% -   65%  ------------------------------------------------------------------- Indications:      Chest pain 786.51.  ------------------------------------------------------------------- History:   PMH:  Chest pain. Chronic kidney disease.  Risk factors: Current tobacco use. Hypertension. Dyslipidemia.  ------------------------------------------------------------------- Study Conclusions  - Left ventricle: The cavity size was normal. Wall thickness was increased in a pattern of severe LVH. Systolic function was normal. The estimated ejection fraction was in the range of 60% to 65%. Wall motion was normal; there  were no regional wall motion abnormalities. Doppler parameters are consistent with abnormal left ventricular relaxation (grade 1 diastolic dysfunction). - Aortic valve: There was no stenosis.  There was trivial regurgitation. - Mitral valve: There was trivial regurgitation. - Left atrium: The atrium was moderately dilated. - Right ventricle: The cavity size was normal. Systolic function was normal. - Right atrium: The atrium was mildly dilated. - Pulmonary arteries: No complete TR doppler jet so unable to estimate PA systolic pressure. - Inferior vena cava: The vessel was normal in size. The respirophasic diameter changes were in the normal range (>= 50%), consistent with normal central venous pressure.  Impressions:  - Normal LV size with severe LV hypertrophy. EF 60-65%. Moderate LAE. Normal RV size and systolic function. No significant valvular abnormalities. Cardiac amyloidosis is a consideration.  ------------------------------------------------------------------- Study data:  The previous study was not available, so comparison was made to the report of 06/11/2013.  Study status:  Routine. Procedure:  The patient reported no pain pre or post test. Transthoracic echocardiography. Image quality was adequate.  Study completion:  There were no complications.          Transthoracic echocardiography.  M-mode, complete 2D, spectral Doppler, and color Doppler.  Birthdate:  Patient birthdate: 05/31/1961.  Age:  Patient is 62 yr old.  Sex:  Gender: female.    BMI: 34.9 kg/m^2.  Blood pressure:     146/94  Patient status:  Inpatient.  Study date: Study date: 09/06/2018. Study time: 08:58 AM.  Location:  Bedside.  -------------------------------------------------------------------  ------------------------------------------------------------------- Left ventricle:  The cavity size was normal. Wall thickness was increased in a pattern of severe LVH. Systolic function was normal. The estimated ejection fraction was in the range of 60% to 65%. Wall motion was normal; there were no regional wall motion abnormalities. Doppler parameters are consistent with abnormal  left ventricular relaxation (grade 1 diastolic dysfunction).  ------------------------------------------------------------------- Aortic valve:   Trileaflet; mildly calcified leaflets.  Doppler: There was no stenosis.   There was trivial regurgitation.  ------------------------------------------------------------------- Aorta:  Aortic root: The aortic root was normal in size. Ascending aorta: The ascending aorta was normal in size.  ------------------------------------------------------------------- Mitral valve:   Normal thickness leaflets .  Doppler:   There was no evidence for stenosis.   There was trivial regurgitation. Peak gradient (D): 3 mm Hg.  ------------------------------------------------------------------- Left atrium:  The atrium was moderately dilated.  ------------------------------------------------------------------- Right ventricle:  The cavity size was normal. Systolic function was normal.  ------------------------------------------------------------------- Pulmonic valve:    Structurally normal valve.   Cusp separation was normal.  Doppler:  Transvalvular velocity was within the normal range. There was no regurgitation.  ------------------------------------------------------------------- Tricuspid valve:   Doppler:  There was trivial regurgitation.  ------------------------------------------------------------------- Pulmonary artery:   No complete TR doppler jet so unable to estimate PA systolic pressure.  ------------------------------------------------------------------- Right atrium:  The atrium was mildly dilated.  ------------------------------------------------------------------- Pericardium:  There was no pericardial effusion.  ------------------------------------------------------------------- Systemic veins: Inferior vena cava: The vessel was normal in size. The respirophasic diameter changes were in the normal range (>= 50%), consistent with  normal central venous pressure.  ------------------------------------------------------------------- Measurements  Left ventricle                           Value        Reference LV ID, ED, PLAX chordal          (L)     42    mm  43 - 52 LV ID, ES, PLAX chordal                  26    mm     23 - 38 LV fx shortening, PLAX chordal           38    %      >=29 LV PW thickness, ED                      23    mm     ---------- IVS/LV PW ratio, ED                      1.17         <=1.3 Stroke volume, 2D                        100   ml     ---------- Stroke volume/bsa, 2D                    44    ml/m^2 ---------- LV e&', lateral                           5     cm/s   ---------- LV E/e&', lateral                         16.12        ---------- LV e&', medial                            4.57  cm/s   ---------- LV E/e&', medial                          17.64        ---------- LV e&', average                           4.79  cm/s   ---------- LV E/e&', average                         16.84        ----------  Ventricular septum                       Value        Reference IVS thickness, ED                        27    mm     ----------  LVOT                                     Value        Reference LVOT ID, S                               19    mm     ---------- LVOT area  2.84  cm^2   ---------- LVOT peak velocity, S                    143   cm/s   ---------- LVOT mean velocity, S                    105   cm/s   ---------- LVOT VTI, S                              35.2  cm     ---------- LVOT peak gradient, S                    8     mm Hg  ----------  Aortic valve                             Value        Reference Aortic regurg pressure half-time         594   ms     ----------  Aorta                                    Value        Reference Aortic root ID, ED                       34    mm     ----------  Left atrium                              Value         Reference LA ID, A-P, ES                           37    mm     ---------- LA ID/bsa, A-P                           1.63  cm/m^2 <=2.2 LA volume, S                             112   ml     ---------- LA volume/bsa, S                         49.2  ml/m^2 ---------- LA volume, ES, 1-p A4C                   117   ml     ---------- LA volume/bsa, ES, 1-p A4C               51.4  ml/m^2 ---------- LA volume, ES, 1-p A2C                   110   ml     ---------- LA volume/bsa, ES, 1-p A2C               48.4  ml/m^2 ----------  Mitral valve  Value        Reference Mitral E-wave peak velocity              80.6  cm/s   ---------- Mitral A-wave peak velocity              92.1  cm/s   ---------- Mitral deceleration time         (H)     246   ms     150 - 230 Mitral peak gradient, D                  3     mm Hg  ---------- Mitral E/A ratio, peak                   0.9          ----------  Right atrium                             Value        Reference RA ID, S-I, ES, A4C              (H)     61.9  mm     34 - 49 RA area, ES, A4C                 (H)     21    cm^2   8.3 - 19.5 RA volume, ES, A/L                       65.1  ml     ---------- RA volume/bsa, ES, A/L                   28.6  ml/m^2 ----------  Systemic veins                           Value        Reference Estimated CVP                            8     mm Hg  ----------  Right ventricle                          Value        Reference TAPSE                                    27.5  mm     ---------- RV pressure, S, DP                       21    mm Hg  <=30 RV s&', lateral, S                        12    cm/s   ----------  Legend: (L)  and  (H)  mark values outside specified reference range.  ------------------------------------------------------------------- Prepared and Electronically Authenticated by  Marca Ancona, M.D. 2020-01-17T10:49:46      CARDIAC MRI  MR CARDIAC MORPHOLOGY W WO CONTRAST  02/18/2021  Narrative CLINICAL DATA:  Severe LVH noted on echocardiogram.  EXAM: CARDIAC MRI  TECHNIQUE: The patient was  scanned on a 1.5 Tesla Siemens magnet. A dedicated cardiac coil was used. Functional imaging was done using Fiesta sequences. 2,3, and 4 chamber views were done to assess for RWMA's. Modified Simpson's rule using a short axis stack was used to calculate an ejection fraction on a dedicated work [employer]. The patient received 10 cc of Gadavist. After 10 minutes inversion recovery sequences were used to assess for infiltration and scar tissue.  CONTRAST:  10 cc  of Gadavist  FINDINGS: 1. Normal left ventricular size and systolic function (LVEF = 61%). There are no regional wall motion abnormalities.  There is severe asymmetric basal-mid septal hypertrophy. Maximal septal thickness 19mm.  There is no LVOT obstruction.  There is no late gadolinium enhancement in the left ventricular myocardium.  LVEDV: 209 ml  LVESV: 82 ml  SV: 127 ml  Myocardial mass: 222g  2. Normal right ventricular size, thickness and systolic function (RVEF = 57%). There are no regional wall motion abnormalities.  3.  Mildly dilated left atrial size.  4. Normal size of the aortic root, ascending aorta and pulmonary artery.  5.  Mild aortic regurgitation.  6.  Normal pericardium.  mild pericardial effusion.  IMPRESSION: 1. Severe asymmetric basal-mid LV septal hypertrophy. maximal septal thickness 19mm.  2.  Normal LV and RV size and function.  LVEF 61%  3.  No evidence for late gadolinium enhancement or scar.  4.  No left ventricular outflow tract obstruction.  5. Findings consistent with HCM, asymmetric septal hypertrophy variant.  Debbe Odea   Electronically Signed By: Debbe Odea M.D. On: 02/18/2021 12:46         Risk Assessment/Calculations:     HYPERTENSION CONTROL Vitals:   04/16/23 1456 04/16/23 1512  BP: (!)  168/72 (!) 162/76    The patient's blood pressure is elevated above target today.  In order to address the patient's elevated BP: A current anti-hypertensive medication was adjusted today.; A referral to the [facility] will be placed. (Resume Coreg, Imdur)          Physical Exam:   VS:  BP (!) 162/76   Pulse 88   Ht 5\' 8"  (1.727 m)   Wt 221 lb (100.2 kg)   BMI 33.60 kg/m    Wt Readings from Last 3 Encounters:  04/16/23 221 lb (100.2 kg)  04/11/23 215 lb (97.5 kg)  03/09/23 213 lb (96.6 kg)    GEN: Well nourished, well developed in no acute distress NECK: No JVD; No carotid bruits CARDIAC: RRR, no murmurs, rubs, gallops RESPIRATORY:  Clear to auscultation without rales, wheezing or rhonchi  ABDOMEN: Soft, non-tender, non-distended EXTREMITIES:  No edema; No deformity   ASSESSMENT AND PLAN: .    Preoperative clearance - Pending ORIF of right ankle fracture. EKG today no acute ST/T wave changes. Exercise tolerance >4 METS. Per AHA/ACC guidelines, she is deemed acceptable risk for the planned procedure without additional cardiovascular testing. Will route to surgical team so they are aware.    Resistant HTN - Initial BP 168/72 with repeat 162/76. Reports home SBP 130-150. She is holding Spironolactone, Coreg, Imdur per her report 5 days prior to surgery. She is taking Hydralazine, Amlodipine. Unclear why holding other antihypertensive agents, recommended to resume. Will schedule prompt follow up with Hypertension Clinic post-procedure for further titration of medications. Adrenal adenoma, as below.   LVH / HCM - Bilateral LE with 2+ edema likely due to dependent position, holding her Spironolactone. Encouraged to resume. cMRI  02/2021 with no amyloidosis.   HLD - Continue Atorvastatin.   Adrenal adenoma - Noted on the right 2018. Labs consistent with hyperaldosteronism. Appropriately on Spironolactone. She was previously recommended for removal of adrenal adenoma but  has not completed due to difficult drive to [facility]. Discuss at follow up.   CKD - Careful titration of diuretic and antihypertensive.  Follows with Dr. Thedore Mins.  Cocaine and tobacco use - Smoking cessation encouraged. Encouraged to avoid cocaine. Recommend utilization of 1800QUITNOW.        Dispo: follow up in 4-6 weeks with Hypertension Clinic  Signed, Alver Sorrow, NP

## 2023-04-16 NOTE — Patient Instructions (Signed)
Medication Instructions:  Your physician has recommended you make the following change in your medication:   Continue amlodipine, hydralazine, carvedilol, and imdur  Resume Cleda Daub after surgery   Follow-Up: At [facility], you and your health needs are our priority.  As part of our continuing mission to provide you with exceptional heart care, we have created designated Provider Care Teams.  These Care Teams include your primary Cardiologist (physician) and Advanced Practice Providers (APPs -  Physician Assistants and Nurse Practitioners) who all work together to provide you with the care you need, when you need it.  We recommend signing up for the patient portal called "MyChart".  Sign up information is provided on this After Visit Summary.  MyChart is used to connect with patients for Virtual Visits (Telemedicine).  Patients are able to view lab/test results, encounter notes, upcoming appointments, etc.  Non-urgent messages can be sent to your provider as well.   To learn more about what you can do with MyChart, go to [email or web address].    Your next appointment:   Follow up in [facility] with Dr. Duke Salvia 10/16 at 10:15am

## 2023-04-17 ENCOUNTER — Telehealth: Payer: 59

## 2023-04-17 ENCOUNTER — Other Ambulatory Visit: Payer: 59 | Admitting: *Deleted

## 2023-04-17 ENCOUNTER — Encounter: Payer: Self-pay | Admitting: *Deleted

## 2023-04-17 NOTE — Patient Instructions (Signed)
Visit Information  Thank you for taking time to visit with me today. Please don't hesitate to contact me if I can be of assistance to you before our next scheduled telephone appointment.  Following are the goals we discussed today:   Goals Addressed             This Visit's Progress    CCM (HYPERLIPIDEMIA) EXPECTED OUTCOME: MONITOR, SELF-MANAGE AND REDUCE SYMPTOMS OF HYPERLIPIDEMIA       Current Barriers:  Knowledge Deficits related to Hyperlipidemia management Chronic Disease Management support and education needs related to Hyperlipidemia, diet, smoking cessation Financial Constraints.  No Advanced Directives in place- pt declines Patient reports she has "cut back on smoking, down to 1-2 cigarettes per day"  and feels she is making good progress towards completely quitting. Patient reports she does not follow a special diet, walks in the evening after work. Patient hospitalized 02/20/23 for symptomatic anemia, states she is feeling better, continues following up with hematologist Patient reports she returned to her job [employer] and slid up on orange juice in the floor and fractured her ankle, pt is to have surgery tomorrow 04/18/23  Planned Interventions: Provider established cholesterol goals reviewed; Counseled on importance of regular laboratory monitoring as prescribed; Discussed strategies to manage statin-induced myalgias; Reviewed importance of limiting foods high in cholesterol; Reviewed exercise goals and target of 150 minutes per week; Reviewed upcoming scheduled appointments  Reinforced importance of having regular labwork/ monitoring for anemia Pain assessment completed  Symptom Management: Take medications as prescribed   Attend all scheduled provider appointments Call pharmacy for medication refills 3-7 days in advance of running out of medications Attend church or other social activities Perform all self care activities independently  Perform IADL's (shopping,  preparing meals, housekeeping, managing finances) independently Call provider office for new concerns or questions  - call doctor with any symptoms you believe are related to your medicine - call doctor when you experience any new symptoms - go to all doctor appointments as scheduled - adhere to prescribed diet: heart healthy Follow heart healthy diet  Follow Up Plan: Telephone follow up appointment with care management team member scheduled for:  follow up for TOC after ankle surgery on 04/18/23 and 06/19/23 at 215 pm       CCM (HYPERTENSION) EXPECTED OUTCOME: MONITOR, SELF-MANAGE AND REDUCE SYMPTOMS OF HYPERTENSION       Current Barriers:  Knowledge Deficits related to Hypertension management Care Coordination needs related to medication management in a patient with Hypertension Chronic Disease Management support and education needs related to Hypertension, diet, achieving a healthy lifestyle Financial Constraints.  No Advanced Directives in place- pt declines Patient reports she lives alone in hotel, has a sister that assists with transportation and is always available to help if needed.  Patient reports she is looking for housing as she had to go to court about her bill at the hotel and this is still in progress, social worker was recently working with pt and provided necessary resources to find housing, pt verbalizes understanding of resources and is able to call and check on these resources herself.  Patient reports she works 3 days per week, gets 362$ from [employer]) Patient reports her blood pressure is checked at work, pt reports she can get a blood pressure cuff from [employer] benefit and plans to do so. Patient reports she has all medications and taking as prescribed Patient is temporarily living with her daughter due to she is not  able to go up steps with ankle injury (pt having surgery tomorrow and plans to return home with her daughter)  Planned  Interventions: Evaluation of current treatment plan related to hypertension self management and patient's adherence to plan as established by provider;   Reviewed medications with patient and discussed importance of compliance;  Counseled on adverse effects of illicit drug and excessive alcohol use in patients with high blood pressure;  Counseled on the importance of exercise goals with target of 150 minutes per week Discussed plans with patient for ongoing care management follow up and provided patient with direct contact information for care management team; Advised patient, providing education and rationale, to monitor blood pressure daily and record, calling PCP for findings outside established parameters;  Discussed complications of poorly controlled blood pressure such as heart disease, stroke, circulatory complications, vision complications, kidney impairment, sexual dysfunction;  Reinforced importance of taking medications as prescribed and timely refills Reviewed low sodium diet  Symptom Management: Take medications as prescribed   Attend all scheduled provider appointments Call pharmacy for medication refills 3-7 days in advance of running out of medications Attend church or other social activities Perform all self care activities independently  Perform IADL's (shopping, preparing meals, housekeeping, managing finances) independently Call provider office for new concerns or questions  check blood pressure 3 times per week choose a place to take my blood pressure (home, clinic or office, retail store) write blood pressure results in a log or diary learn about high blood pressure keep a blood pressure log take blood pressure log to all doctor appointments call doctor for signs and symptoms of high blood pressure keep all doctor appointments take medications for blood pressure exactly as prescribed report new symptoms to your doctor eat more whole grains, fruits and vegetables,  lean meats and healthy fats Follow low sodium diet- read food labels for sodium content Continue to contact and follow up with agencies/ resources provided by social worker to find housing  Follow Up Plan: Telephone follow up appointment with care management team member scheduled for:   follow up with TOC after surgery and scheduled call 06/19/23 at 215 pm           Our next appointment is by telephone on 06/19/23 at 215 pm   Please call the care guide team at [phone] if you need to cancel or reschedule your appointment.   If you are experiencing a Mental Health or Behavioral Health Crisis or need someone to talk to, please call the Suicide and Crisis Lifeline: 988 call the [place] National Suicide Prevention Lifeline: (385)070-7926 or TTY: 5142613551 TTY 254-405-3870) to talk to a trained counselor call 1-800-273-TALK (toll free, 24 hour hotline) go to [facility] [address] [place] [phone]) call 911   The patient verbalized understanding of instructions, educational materials, and care plan provided today and DECLINED offer to receive copy of patient instructions, educational materials, and care plan.   Telephone follow up appointment with care management team member scheduled for: 06/19/23 at 215 pm  Irving Shows [facility], BSN [place]/ Ambulatory Care Management [phone]

## 2023-04-17 NOTE — Anesthesia Preprocedure Evaluation (Signed)
Anesthesia Evaluation  Patient identified by MRN, date of birth, ID band Patient awake    Reviewed: Allergy & Precautions, H&P , NPO status , Patient's Chart, lab work & pertinent test results  Airway Mallampati: II   Neck ROM: full    Dental   Pulmonary Current Smoker and Patient abstained from smoking.   breath sounds clear to auscultation       Cardiovascular hypertension, +CHF   Rhythm:regular Rate:Normal  Severe LVH. EF 60%   Neuro/Psych    GI/Hepatic ,GERD  ,,  Endo/Other    Renal/GU Renal InsufficiencyRenal disease     Musculoskeletal   Abdominal   Peds  Hematology   Anesthesia Other Findings   Reproductive/Obstetrics                             Anesthesia Physical Anesthesia Plan  ASA: 3  Anesthesia Plan: General   Post-op Pain Management: Regional block*   Induction: Intravenous  PONV Risk Score and Plan: 2 and Ondansetron, Dexamethasone, Midazolam and Treatment may vary due to age or medical condition  Airway Management Planned: LMA  Additional Equipment:   Intra-op Plan:   Post-operative Plan: Extubation in OR  Informed Consent: I have reviewed the patients History and Physical, chart, labs and discussed the procedure including the risks, benefits and alternatives for the proposed anesthesia with the patient or authorized representative who has indicated his/her understanding and acceptance.     Dental advisory given  Plan Discussed with: CRNA, Anesthesiologist and Surgeon  Anesthesia Plan Comments: (See PAT note from 8/21 by K Gekas PA-C )        Anesthesia Quick Evaluation

## 2023-04-17 NOTE — Patient Outreach (Signed)
Care Management   Visit Note  04/17/2023 Name: [name] MRN: [id number] DOB: July 22, 1961  Subjective: [name] is a 62 y.o. year old female who is a primary care patient of Summer Felts, FNP. The Care Management team was consulted for assistance.      Engaged with patient spoke with patient by telephone for follow up   Goals Addressed             This Visit's Progress    CCM (HYPERLIPIDEMIA) EXPECTED OUTCOME: MONITOR, SELF-MANAGE AND REDUCE SYMPTOMS OF HYPERLIPIDEMIA       Current Barriers:  Knowledge Deficits related to Hyperlipidemia management Chronic Disease Management support and education needs related to Hyperlipidemia, diet, smoking cessation Financial Constraints.  No Advanced Directives in place- pt declines Patient reports she has "cut back on smoking, down to 1-2 cigarettes per day"  and feels she is making good progress towards completely quitting. Patient reports she does not follow a special diet, walks in the evening after work. Patient hospitalized 02/20/23 for symptomatic anemia, states she is feeling better, continues following up with hematologist Patient reports she returned to her job [employer] and slid up on orange juice in the floor and fractured her ankle, pt is to have surgery tomorrow 04/18/23  Planned Interventions: Provider established cholesterol goals reviewed; Counseled on importance of regular laboratory monitoring as prescribed; Discussed strategies to manage statin-induced myalgias; Reviewed importance of limiting foods high in cholesterol; Reviewed exercise goals and target of 150 minutes per week; Reviewed upcoming scheduled appointments  Reinforced importance of having regular labwork/ monitoring for anemia Pain assessment completed  Symptom Management: Take medications as prescribed   Attend all scheduled provider appointments Call pharmacy for medication refills 3-7 days in advance of running out of medications Attend  church or other social activities Perform all self care activities independently  Perform IADL's (shopping, preparing meals, housekeeping, managing finances) independently Call provider office for new concerns or questions  - call doctor with any symptoms you believe are related to your medicine - call doctor when you experience any new symptoms - go to all doctor appointments as scheduled - adhere to prescribed diet: heart healthy Follow heart healthy diet  Follow Up Plan: Telephone follow up appointment with care management team member scheduled for:  follow up for TOC after ankle surgery on 04/18/23 and 06/19/23 at 215 pm       CCM (HYPERTENSION) EXPECTED OUTCOME: MONITOR, SELF-MANAGE AND REDUCE SYMPTOMS OF HYPERTENSION       Current Barriers:  Knowledge Deficits related to Hypertension management Care Coordination needs related to medication management in a patient with Hypertension Chronic Disease Management support and education needs related to Hypertension, diet, achieving a healthy lifestyle Financial Constraints.  No Advanced Directives in place- pt declines Patient reports she lives alone in hotel, has a sister that assists with transportation and is always available to help if needed.  Patient reports she is looking for housing as she had to go to court about her bill at the hotel and this is still in progress, social worker was recently working with pt and provided necessary resources to find housing, pt verbalizes understanding of resources and is able to call and check on these resources herself.  Patient reports she works 3 days per week, gets 362$ from [employer]) Patient reports her blood pressure is checked at work, pt reports she can get a blood pressure cuff from [employer] benefit and plans to do so. Patient reports she  has all medications and taking as prescribed Patient is temporarily living with her daughter due to she is not able to go up steps with ankle  injury (pt having surgery tomorrow and plans to return home with her daughter)  Planned Interventions: Evaluation of current treatment plan related to hypertension self management and patient's adherence to plan as established by provider;   Reviewed medications with patient and discussed importance of compliance;  Counseled on adverse effects of illicit drug and excessive alcohol use in patients with high blood pressure;  Counseled on the importance of exercise goals with target of 150 minutes per week Discussed plans with patient for ongoing care management follow up and provided patient with direct contact information for care management team; Advised patient, providing education and rationale, to monitor blood pressure daily and record, calling PCP for findings outside established parameters;  Discussed complications of poorly controlled blood pressure such as heart disease, stroke, circulatory complications, vision complications, kidney impairment, sexual dysfunction;  Reinforced importance of taking medications as prescribed and timely refills Reviewed low sodium diet  Symptom Management: Take medications as prescribed   Attend all scheduled provider appointments Call pharmacy for medication refills 3-7 days in advance of running out of medications Attend church or other social activities Perform all self care activities independently  Perform IADL's (shopping, preparing meals, housekeeping, managing finances) independently Call provider office for new concerns or questions  check blood pressure 3 times per week choose a place to take my blood pressure (home, clinic or office, retail store) write blood pressure results in a log or diary learn about high blood pressure keep a blood pressure log take blood pressure log to all doctor appointments call doctor for signs and symptoms of high blood pressure keep all doctor appointments take medications for blood pressure exactly as  prescribed report new symptoms to your doctor eat more whole grains, fruits and vegetables, lean meats and healthy fats Follow low sodium diet- read food labels for sodium content Continue to contact and follow up with agencies/ resources provided by social worker to find housing  Follow Up Plan: Telephone follow up appointment with care management team member scheduled for:   follow up with TOC after surgery and scheduled call 06/19/23 at 215 pm            Plan: Telephone follow up appointment with care management team member scheduled for: 06/19/23 at 215 pm  Irving Shows [facility], BSN [place]/ Ambulatory Care Management [phone]'

## 2023-04-18 ENCOUNTER — Other Ambulatory Visit: Payer: Self-pay

## 2023-04-18 ENCOUNTER — Ambulatory Visit (HOSPITAL_COMMUNITY): Payer: 59 | Admitting: Physician Assistant

## 2023-04-18 ENCOUNTER — Observation Stay (HOSPITAL_COMMUNITY)
Admission: RE | Admit: 2023-04-18 | Discharge: 2023-04-19 | Disposition: A | Discharge status: Home / Self Care | Payer: 59 | Attending: Orthopedic Surgery | Admitting: Orthopedic Surgery

## 2023-04-18 ENCOUNTER — Observation Stay (HOSPITAL_COMMUNITY): Payer: 59

## 2023-04-18 ENCOUNTER — Encounter (HOSPITAL_COMMUNITY)
Admission: RE | Disposition: A | Discharge status: Home / Self Care | Payer: Self-pay | Source: Home / Self Care | Attending: Orthopedic Surgery

## 2023-04-18 ENCOUNTER — Ambulatory Visit (HOSPITAL_COMMUNITY): Payer: 59

## 2023-04-18 ENCOUNTER — Ambulatory Visit (HOSPITAL_BASED_OUTPATIENT_CLINIC_OR_DEPARTMENT_OTHER): Payer: 59 | Admitting: Anesthesiology

## 2023-04-18 ENCOUNTER — Encounter (HOSPITAL_COMMUNITY): Payer: Self-pay | Admitting: Orthopedic Surgery

## 2023-04-18 DIAGNOSIS — G8918 Other acute postprocedural pain: Secondary | ICD-10-CM | POA: Diagnosis not present

## 2023-04-18 DIAGNOSIS — I130 Hypertensive heart and chronic kidney disease with heart failure and stage 1 through stage 4 chronic kidney disease, or unspecified chronic kidney disease: Secondary | ICD-10-CM

## 2023-04-18 DIAGNOSIS — F17210 Nicotine dependence, cigarettes, uncomplicated: Secondary | ICD-10-CM | POA: Insufficient documentation

## 2023-04-18 DIAGNOSIS — Z01818 Encounter for other preprocedural examination: Principal | ICD-10-CM

## 2023-04-18 DIAGNOSIS — S82841A Displaced bimalleolar fracture of right lower leg, initial encounter for closed fracture: Secondary | ICD-10-CM

## 2023-04-18 DIAGNOSIS — R262 Difficulty in walking, not elsewhere classified: Secondary | ICD-10-CM | POA: Diagnosis not present

## 2023-04-18 DIAGNOSIS — M6281 Muscle weakness (generalized): Secondary | ICD-10-CM | POA: Insufficient documentation

## 2023-04-18 DIAGNOSIS — S82891A Other fracture of right lower leg, initial encounter for closed fracture: Secondary | ICD-10-CM | POA: Diagnosis present

## 2023-04-18 DIAGNOSIS — I5030 Unspecified diastolic (congestive) heart failure: Secondary | ICD-10-CM | POA: Diagnosis not present

## 2023-04-18 DIAGNOSIS — R2681 Unsteadiness on feet: Secondary | ICD-10-CM | POA: Insufficient documentation

## 2023-04-18 DIAGNOSIS — R2689 Other abnormalities of gait and mobility: Secondary | ICD-10-CM | POA: Insufficient documentation

## 2023-04-18 DIAGNOSIS — N189 Chronic kidney disease, unspecified: Secondary | ICD-10-CM | POA: Insufficient documentation

## 2023-04-18 DIAGNOSIS — Z9181 History of falling: Secondary | ICD-10-CM | POA: Diagnosis not present

## 2023-04-18 DIAGNOSIS — N183 Chronic kidney disease, stage 3 unspecified: Secondary | ICD-10-CM | POA: Diagnosis not present

## 2023-04-18 DIAGNOSIS — W16112A Fall into natural body of water striking water surface causing other injury, initial encounter: Secondary | ICD-10-CM | POA: Diagnosis not present

## 2023-04-18 DIAGNOSIS — I129 Hypertensive chronic kidney disease with stage 1 through stage 4 chronic kidney disease, or unspecified chronic kidney disease: Secondary | ICD-10-CM | POA: Diagnosis not present

## 2023-04-18 DIAGNOSIS — S93431A Sprain of tibiofibular ligament of right ankle, initial encounter: Secondary | ICD-10-CM | POA: Diagnosis not present

## 2023-04-18 DIAGNOSIS — I509 Heart failure, unspecified: Secondary | ICD-10-CM | POA: Diagnosis not present

## 2023-04-18 DIAGNOSIS — Y9301 Activity, walking, marching and hiking: Secondary | ICD-10-CM | POA: Diagnosis not present

## 2023-04-18 DIAGNOSIS — S82401A Unspecified fracture of shaft of right fibula, initial encounter for closed fracture: Secondary | ICD-10-CM | POA: Diagnosis not present

## 2023-04-18 HISTORY — PX: ORIF ANKLE FRACTURE: SHX5408

## 2023-04-18 LAB — COMPREHENSIVE METABOLIC PANEL
ALT: 13 U/L (ref 0–44)
AST: 12 U/L — ABNORMAL LOW (ref 15–41)
Albumin: 3.4 g/dL — ABNORMAL LOW (ref 3.5–5.0)
Alkaline Phosphatase: 46 U/L (ref 38–126)
Anion gap: 10 (ref 5–15)
BUN: 34 mg/dL — ABNORMAL HIGH (ref 8–23)
CO2: 21 mmol/L — ABNORMAL LOW (ref 22–32)
Calcium: 8.7 mg/dL — ABNORMAL LOW (ref 8.9–10.3)
Chloride: 110 mmol/L (ref 98–111)
Creatinine, Ser: 1.74 mg/dL — ABNORMAL HIGH (ref 0.44–1.00)
GFR, Estimated: 33 mL/min — ABNORMAL LOW (ref >60)
Glucose, Bld: 91 mg/dL (ref 70–99)
Potassium: 3.8 mmol/L (ref 3.5–5.1)
Sodium: 141 mmol/L (ref 135–145)
Total Bilirubin: 0.6 mg/dL (ref 0.3–1.2)
Total Protein: 7.5 g/dL (ref 6.5–8.1)

## 2023-04-18 LAB — CBC
HCT: 30.3 % — ABNORMAL LOW (ref 36.0–46.0)
Hemoglobin: 8.5 g/dL — ABNORMAL LOW (ref 12.0–15.0)
MCH: 24.9 pg — ABNORMAL LOW (ref 26.0–34.0)
MCHC: 28.1 g/dL — ABNORMAL LOW (ref 30.0–36.0)
MCV: 88.6 fL (ref 80.0–100.0)
Platelets: 366 10*3/uL (ref 150–400)
RBC: 3.42 MIL/uL — ABNORMAL LOW (ref 3.87–5.11)
RDW: 20 % — ABNORMAL HIGH (ref 11.5–15.5)
WBC: 6.1 10*3/uL (ref 4.0–10.5)
nRBC: 0 % (ref 0.0–0.2)

## 2023-04-18 LAB — PROTIME-INR
INR: 1.1 (ref 0.8–1.2)
Prothrombin Time: 14.5 seconds (ref 11.4–15.2)

## 2023-04-18 SURGERY — OPEN REDUCTION INTERNAL FIXATION (ORIF) ANKLE FRACTURE
Anesthesia: General | Site: Ankle | Laterality: Right

## 2023-04-18 MED ORDER — FENTANYL CITRATE PF 50 MCG/ML IJ SOSY
PREFILLED_SYRINGE | INTRAMUSCULAR | Status: AC
  Filled 2023-04-18: qty 2

## 2023-04-18 MED ORDER — GABAPENTIN 100 MG PO CAPS: 200.0000 mg | ORAL_CAPSULE | Freq: Two times a day (BID) | ORAL | Status: DC | PRN

## 2023-04-18 MED ORDER — METHOCARBAMOL 1000 MG/10ML IJ SOLN: 500.0000 mg | Freq: Four times a day (QID) | INTRAVENOUS | Status: DC | PRN

## 2023-04-18 MED ORDER — BUPIVACAINE-EPINEPHRINE (PF) 0.5% -1:200000 IJ SOLN
INTRAMUSCULAR | Status: AC
  Filled 2023-04-18: qty 30

## 2023-04-18 MED ORDER — FENTANYL CITRATE PF 50 MCG/ML IJ SOSY
50.0000 ug | PREFILLED_SYRINGE | INTRAMUSCULAR | Status: AC
  Administered 2023-04-18: 100 ug via INTRAVENOUS
  Filled 2023-04-18: qty 2

## 2023-04-18 MED ORDER — FENTANYL CITRATE PF 50 MCG/ML IJ SOSY
25.0000 ug | PREFILLED_SYRINGE | INTRAMUSCULAR | Status: DC | PRN
  Administered 2023-04-18 (×2): 50 ug via INTRAVENOUS

## 2023-04-18 MED ORDER — MENTHOL 3 MG MT LOZG: 1.0000 | LOZENGE | OROMUCOSAL | Status: DC | PRN

## 2023-04-18 MED ORDER — VANCOMYCIN HCL 1000 MG IV SOLR
INTRAVENOUS | Status: AC
  Filled 2023-04-18: qty 20

## 2023-04-18 MED ORDER — ASPIRIN 81 MG PO CHEW
81.0000 mg | CHEWABLE_TABLET | Freq: Every day | ORAL | Status: DC
  Administered 2023-04-19: 81 mg via ORAL
  Filled 2023-04-18: qty 1

## 2023-04-18 MED ORDER — FENTANYL CITRATE (PF) 100 MCG/2ML IJ SOLN
INTRAMUSCULAR | Status: DC | PRN
  Administered 2023-04-18: 100 ug via INTRAVENOUS

## 2023-04-18 MED ORDER — ONDANSETRON HCL 4 MG/2ML IJ SOLN
INTRAMUSCULAR | Status: DC | PRN
Start: 2023-04-18 — End: 2023-04-18
  Administered 2023-04-18: 4 mg via INTRAVENOUS

## 2023-04-18 MED ORDER — PROPOFOL 10 MG/ML IV BOLUS
INTRAVENOUS | Status: DC | PRN
Start: 2023-04-18 — End: 2023-04-18
  Administered 2023-04-18: 150 mg via INTRAVENOUS

## 2023-04-18 MED ORDER — GLYCOPYRROLATE 0.2 MG/ML IJ SOLN
INTRAMUSCULAR | Status: AC
  Filled 2023-04-18: qty 1

## 2023-04-18 MED ORDER — DIPHENHYDRAMINE HCL 12.5 MG/5ML PO ELIX: 12.5000 mg | ORAL_SOLUTION | ORAL | Status: DC | PRN

## 2023-04-18 MED ORDER — ACETAMINOPHEN 500 MG PO TABS
1000.0000 mg | ORAL_TABLET | Freq: Four times a day (QID) | ORAL | Status: AC
  Administered 2023-04-18 – 2023-04-19 (×4): 1000 mg via ORAL
  Filled 2023-04-18 (×4): qty 2

## 2023-04-18 MED ORDER — CEFAZOLIN SODIUM-DEXTROSE 2-4 GM/100ML-% IV SOLN
2.0000 g | Freq: Four times a day (QID) | INTRAVENOUS | Status: AC
  Administered 2023-04-18 – 2023-04-19 (×2): 2 g via INTRAVENOUS
  Filled 2023-04-18 (×2): qty 100

## 2023-04-18 MED ORDER — AMLODIPINE BESYLATE 10 MG PO TABS
10.0000 mg | ORAL_TABLET | Freq: Every day | ORAL | Status: DC
  Administered 2023-04-19: 10 mg via ORAL
  Filled 2023-04-18: qty 1

## 2023-04-18 MED ORDER — SUCCINYLCHOLINE CHLORIDE 200 MG/10ML IV SOSY
PREFILLED_SYRINGE | INTRAVENOUS | Status: AC
  Filled 2023-04-18: qty 10

## 2023-04-18 MED ORDER — SUCCINYLCHOLINE CHLORIDE 200 MG/10ML IV SOSY
PREFILLED_SYRINGE | INTRAVENOUS | Status: DC | PRN
Start: 2023-04-18 — End: 2023-04-18
  Administered 2023-04-18: 100 mg via INTRAVENOUS

## 2023-04-18 MED ORDER — MIDAZOLAM HCL 2 MG/2ML IJ SOLN
1.0000 mg | INTRAMUSCULAR | Status: AC
  Administered 2023-04-18: 2 mg via INTRAVENOUS
  Filled 2023-04-18: qty 2

## 2023-04-18 MED ORDER — DEXAMETHASONE SODIUM PHOSPHATE 4 MG/ML IJ SOLN
INTRAMUSCULAR | Status: DC | PRN
  Administered 2023-04-18: 8 mg via INTRAVENOUS

## 2023-04-18 MED ORDER — METHOCARBAMOL 500 MG PO TABS
500.0000 mg | ORAL_TABLET | Freq: Four times a day (QID) | ORAL | Status: DC | PRN
  Administered 2023-04-18 – 2023-04-19 (×2): 500 mg via ORAL
  Filled 2023-04-18 (×2): qty 1

## 2023-04-18 MED ORDER — VANCOMYCIN HCL 1000 MG IV SOLR
INTRAVENOUS | Status: DC | PRN
  Administered 2023-04-18: 1000 mg via TOPICAL

## 2023-04-18 MED ORDER — CEFAZOLIN SODIUM-DEXTROSE 2-4 GM/100ML-% IV SOLN
2.0000 g | INTRAVENOUS | Status: AC
  Administered 2023-04-18: 2 g via INTRAVENOUS
  Filled 2023-04-18: qty 100

## 2023-04-18 MED ORDER — POLYETHYLENE GLYCOL 3350 17 G PO PACK: 17.0000 g | PACK | Freq: Every day | ORAL | Status: DC | PRN

## 2023-04-18 MED ORDER — ACETAMINOPHEN 325 MG PO TABS: 325.0000 mg | ORAL_TABLET | Freq: Four times a day (QID) | ORAL | Status: DC | PRN

## 2023-04-18 MED ORDER — ONDANSETRON HCL 4 MG PO TABS: 4.0000 mg | ORAL_TABLET | Freq: Four times a day (QID) | ORAL | Status: DC | PRN

## 2023-04-18 MED ORDER — FENTANYL CITRATE (PF) 100 MCG/2ML IJ SOLN
INTRAMUSCULAR | Status: AC
  Filled 2023-04-18: qty 2

## 2023-04-18 MED ORDER — DOCUSATE SODIUM 100 MG PO CAPS
100.0000 mg | ORAL_CAPSULE | Freq: Two times a day (BID) | ORAL | Status: DC
  Administered 2023-04-18 – 2023-04-19 (×2): 100 mg via ORAL
  Filled 2023-04-18 (×2): qty 1

## 2023-04-18 MED ORDER — PANTOPRAZOLE SODIUM 40 MG PO TBEC
40.0000 mg | DELAYED_RELEASE_TABLET | Freq: Every day | ORAL | Status: DC
  Administered 2023-04-18 – 2023-04-19 (×2): 40 mg via ORAL
  Filled 2023-04-18 (×2): qty 1

## 2023-04-18 MED ORDER — ONDANSETRON HCL 4 MG/2ML IJ SOLN: 4.0000 mg | Freq: Four times a day (QID) | INTRAMUSCULAR | Status: DC | PRN

## 2023-04-18 MED ORDER — ORAL CARE MOUTH RINSE: 15.0000 mL | Freq: Once | OROMUCOSAL | Status: AC

## 2023-04-18 MED ORDER — CHLORHEXIDINE GLUCONATE 0.12 % MT SOLN
15.0000 mL | Freq: Once | OROMUCOSAL | Status: AC
  Administered 2023-04-18: 15 mL via OROMUCOSAL

## 2023-04-18 MED ORDER — LACTATED RINGERS IV SOLN: INTRAVENOUS | Status: DC

## 2023-04-18 MED ORDER — BUPIVACAINE HCL (PF) 0.5 % IJ SOLN
INTRAMUSCULAR | Status: DC | PRN
  Administered 2023-04-18: 25 mL via PERINEURAL
  Administered 2023-04-18: 15 mL via PERINEURAL

## 2023-04-18 MED ORDER — OXYCODONE HCL 5 MG/5ML PO SOLN: 5.0000 mg | Freq: Once | ORAL | Status: DC | PRN

## 2023-04-18 MED ORDER — DEXAMETHASONE SODIUM PHOSPHATE 10 MG/ML IJ SOLN
INTRAMUSCULAR | Status: AC
  Filled 2023-04-18: qty 1

## 2023-04-18 MED ORDER — ONDANSETRON HCL 4 MG/2ML IJ SOLN
INTRAMUSCULAR | Status: AC
  Filled 2023-04-18: qty 2

## 2023-04-18 MED ORDER — ROCURONIUM BROMIDE 10 MG/ML (PF) SYRINGE
PREFILLED_SYRINGE | INTRAVENOUS | Status: AC
  Filled 2023-04-18: qty 10

## 2023-04-18 MED ORDER — OXYCODONE HCL 5 MG PO TABS: 5.0000 mg | ORAL_TABLET | Freq: Once | ORAL | Status: DC | PRN

## 2023-04-18 MED ORDER — CARVEDILOL 6.25 MG PO TABS
6.2500 mg | ORAL_TABLET | Freq: Two times a day (BID) | ORAL | Status: DC
  Administered 2023-04-18 – 2023-04-19 (×2): 6.25 mg via ORAL
  Filled 2023-04-18 (×2): qty 1

## 2023-04-18 MED ORDER — MIDAZOLAM HCL 2 MG/2ML IJ SOLN
INTRAMUSCULAR | Status: AC
  Filled 2023-04-18: qty 2

## 2023-04-18 MED ORDER — SPIRONOLACTONE 25 MG PO TABS
25.0000 mg | ORAL_TABLET | Freq: Every day | ORAL | Status: DC
  Administered 2023-04-19: 25 mg via ORAL
  Filled 2023-04-18: qty 1

## 2023-04-18 MED ORDER — LIDOCAINE HCL (CARDIAC) PF 100 MG/5ML IV SOSY
PREFILLED_SYRINGE | INTRAVENOUS | Status: DC | PRN
  Administered 2023-04-18: 60 mg via INTRAVENOUS

## 2023-04-18 MED ORDER — FERROUS GLUCONATE 324 (38 FE) MG PO TABS
324.0000 mg | ORAL_TABLET | Freq: Every day | ORAL | Status: DC
  Administered 2023-04-19: 324 mg via ORAL
  Filled 2023-04-18: qty 1

## 2023-04-18 MED ORDER — DEXMEDETOMIDINE HCL IN NACL 80 MCG/20ML IV SOLN
INTRAVENOUS | Status: AC
  Filled 2023-04-18: qty 20

## 2023-04-18 MED ORDER — PHENYLEPHRINE HCL-NACL 20-0.9 MG/250ML-% IV SOLN
INTRAVENOUS | Status: DC | PRN
Start: 2023-04-18 — End: 2023-04-18
  Administered 2023-04-18: 15 ug/min via INTRAVENOUS

## 2023-04-18 MED ORDER — 0.9 % SODIUM CHLORIDE (POUR BTL) OPTIME
TOPICAL | Status: DC | PRN
  Administered 2023-04-18: 1000 mL

## 2023-04-18 MED ORDER — ISOSORBIDE MONONITRATE ER 60 MG PO TB24
60.0000 mg | ORAL_TABLET | Freq: Every day | ORAL | Status: DC
  Administered 2023-04-19: 60 mg via ORAL
  Filled 2023-04-18: qty 1

## 2023-04-18 MED ORDER — HYDRALAZINE HCL 50 MG PO TABS
50.0000 mg | ORAL_TABLET | Freq: Three times a day (TID) | ORAL | Status: DC
  Administered 2023-04-18 – 2023-04-19 (×3): 50 mg via ORAL
  Filled 2023-04-18 (×3): qty 1

## 2023-04-18 MED ORDER — ATORVASTATIN CALCIUM 40 MG PO TABS
40.0000 mg | ORAL_TABLET | Freq: Every day | ORAL | Status: DC
  Administered 2023-04-19: 40 mg via ORAL
  Filled 2023-04-18: qty 1

## 2023-04-18 MED ORDER — ISOPROPYL ALCOHOL 70 % SOLN
Status: DC | PRN
  Administered 2023-04-18: 1 via TOPICAL

## 2023-04-18 MED ORDER — GLYCOPYRROLATE 0.2 MG/ML IJ SOLN
INTRAMUSCULAR | Status: DC | PRN
  Administered 2023-04-18 (×2): .1 mg via INTRAVENOUS

## 2023-04-18 MED ORDER — LIDOCAINE HCL (PF) 2 % IJ SOLN
INTRAMUSCULAR | Status: AC
  Filled 2023-04-18: qty 5

## 2023-04-18 MED ORDER — PROPOFOL 10 MG/ML IV BOLUS
INTRAVENOUS | Status: AC
  Filled 2023-04-18: qty 20

## 2023-04-18 MED ORDER — HYDROMORPHONE HCL 1 MG/ML IJ SOLN: 0.5000 mg | INTRAMUSCULAR | Status: DC | PRN

## 2023-04-18 MED ORDER — PHENOL 1.4 % MT LIQD: 1.0000 | OROMUCOSAL | Status: DC | PRN

## 2023-04-18 MED ORDER — OXYCODONE HCL 5 MG PO TABS
5.0000 mg | ORAL_TABLET | ORAL | Status: DC | PRN
  Administered 2023-04-19 (×2): 5 mg via ORAL
  Filled 2023-04-18 (×2): qty 1

## 2023-04-18 MED ORDER — DEXMEDETOMIDINE HCL IN NACL 80 MCG/20ML IV SOLN
INTRAVENOUS | Status: DC | PRN
Start: 2023-04-18 — End: 2023-04-18
  Administered 2023-04-18: 10 ug via INTRAVENOUS
  Administered 2023-04-18: 8 ug via INTRAVENOUS

## 2023-04-18 SURGICAL SUPPLY — 81 items
ADH SKN CLS APL DERMABOND .7 (GAUZE/BANDAGES/DRESSINGS) ×1
APL PRP STRL LF DISP 70% ISPRP (MISCELLANEOUS) ×2
BAG COUNTER SPONGE SURGICOUNT (BAG) ×2 IMPLANT
BAG SPNG CNTER NS LX DISP (BAG) ×1
BANDAGE ESMARK 6X9 LF (GAUZE/BANDAGES/DRESSINGS) IMPLANT
BIT DRILL 2 CANN GRADUATED (BIT) IMPLANT
BIT DRILL 2.5 CANN LNG (BIT) IMPLANT
BIT DRILL 2.5 CANN STRL (BIT) IMPLANT
BIT DRILL 2.6 CANN (BIT) IMPLANT
BNDG CMPR 5X4 CHSV STRCH STRL (GAUZE/BANDAGES/DRESSINGS) ×1
BNDG CMPR 5X4 KNIT ELC UNQ LF (GAUZE/BANDAGES/DRESSINGS)
BNDG CMPR 5X6 CHSV STRCH STRL (GAUZE/BANDAGES/DRESSINGS) ×1
BNDG CMPR 9X6 STRL LF SNTH (GAUZE/BANDAGES/DRESSINGS)
BNDG CMPR STD VLCR NS LF 5.8X4 (GAUZE/BANDAGES/DRESSINGS) ×1
BNDG COHESIVE 4X5 TAN STRL LF (GAUZE/BANDAGES/DRESSINGS) ×2 IMPLANT
BNDG COHESIVE 6X5 TAN ST LF (GAUZE/BANDAGES/DRESSINGS) IMPLANT
BNDG ELASTIC 4INX 5YD STR LF (GAUZE/BANDAGES/DRESSINGS) IMPLANT
BNDG ELASTIC 4X5.8 VLCR NS LF (GAUZE/BANDAGES/DRESSINGS) IMPLANT
BNDG ESMARK 6X9 LF (GAUZE/BANDAGES/DRESSINGS)
CHLORAPREP W/TINT 26 (MISCELLANEOUS) ×4 IMPLANT
COVER SURGICAL LIGHT HANDLE (MISCELLANEOUS) ×2 IMPLANT
CUFF TOURN SGL QUICK 34 (TOURNIQUET CUFF) ×1
CUFF TRNQT CYL 34X4.125X (TOURNIQUET CUFF) ×2 IMPLANT
DERMABOND ADVANCED .7 DNX12 (GAUZE/BANDAGES/DRESSINGS) ×2 IMPLANT
DRAPE 3/4 80X56 (DRAPES) ×2 IMPLANT
DRAPE C-ARM 42X120 X-RAY (DRAPES) ×2 IMPLANT
DRAPE C-ARMOR (DRAPES) ×2 IMPLANT
DRAPE U-SHAPE 47X51 STRL (DRAPES) ×2 IMPLANT
DRSG EMULSION OIL 3X3 NADH (GAUZE/BANDAGES/DRESSINGS) IMPLANT
ELECT REM PT RETURN 15FT ADLT (MISCELLANEOUS) ×2 IMPLANT
GAUZE PAD ABD 8X10 STRL (GAUZE/BANDAGES/DRESSINGS) IMPLANT
GAUZE SPONGE 4X4 12PLY STRL (GAUZE/BANDAGES/DRESSINGS) IMPLANT
GAUZE XEROFORM 1X8 LF (GAUZE/BANDAGES/DRESSINGS) IMPLANT
GLOVE BIO SURGEON STRL SZ8 (GLOVE) ×4 IMPLANT
GLOVE BIOGEL PI IND STRL 8 (GLOVE) ×4 IMPLANT
GLOVE BIOGEL PI IND STRL 9 (GLOVE) ×2 IMPLANT
GOWN STRL REUS W/ TWL LRG LVL3 (GOWN DISPOSABLE) IMPLANT
GOWN STRL REUS W/ TWL XL LVL3 (GOWN DISPOSABLE) ×2 IMPLANT
GOWN STRL REUS W/TWL LRG LVL3 (GOWN DISPOSABLE)
GOWN STRL REUS W/TWL XL LVL3 (GOWN DISPOSABLE) ×1
GUIDEWIRE 1.35MM (WIRE) IMPLANT
IMPL TIGHTROP W/DRV K-LESS (Anchor) IMPLANT
IMPLANT TIGHTROPE W/DRV K-LESS (Anchor) ×1 IMPLANT
K-WIRE BB-TAK (WIRE) ×2
KIT BASIN OR (CUSTOM PROCEDURE TRAY) ×2 IMPLANT
KIT TURNOVER KIT A (KITS) IMPLANT
KWIRE BB-TAK (WIRE) IMPLANT
MANIFOLD NEPTUNE II (INSTRUMENTS) IMPLANT
NDL HYPO 22X1.5 SAFETY MO (MISCELLANEOUS) IMPLANT
NEEDLE HYPO 22X1.5 SAFETY MO (MISCELLANEOUS) IMPLANT
NS IRRIG 1000ML POUR BTL (IV SOLUTION) ×2 IMPLANT
PACK ORTHO EXTREMITY (CUSTOM PROCEDURE TRAY) ×2 IMPLANT
PAD ARMBOARD 7.5X6 YLW CONV (MISCELLANEOUS) ×4 IMPLANT
PAD CAST 4YDX4 CTTN HI CHSV (CAST SUPPLIES) IMPLANT
PADDING CAST ABS COTTON 6X4 NS (CAST SUPPLIES) IMPLANT
PADDING CAST COTTON 4X4 STRL (CAST SUPPLIES) ×1
PADDING CAST COTTON 6X4 STRL (CAST SUPPLIES) IMPLANT
PLATE DIST FIB 8H (Plate) IMPLANT
SCREW BN T10 FT 20X2.7XST CORT (Screw) IMPLANT
SCREW COMP KREULOCK 2.7X10 (Screw) IMPLANT
SCREW COMP KREULOCK 2.7X14 (Screw) IMPLANT
SCREW COMP KREULOCK 2.7X16 (Screw) IMPLANT
SCREW COMP KREULOCK 2.7X20 (Screw) IMPLANT
SCREW COMP KREULOCK 3.5X16 (Screw) IMPLANT
SCREW CORT 2.7X20 (Screw) ×1 IMPLANT
SCREW LOW PROFILE 3.5X16 (Screw) IMPLANT
SCREW LOW PROFILE CANN 4.0X45 (Screw) IMPLANT
SCREW NON-LOCKING 3.5X20 ANKLE (Screw) IMPLANT
SPLINT CAST 1 STEP 5X30 WHT (MISCELLANEOUS) IMPLANT
STRIP CLOSURE SKIN 1/2X4 (GAUZE/BANDAGES/DRESSINGS) IMPLANT
SUCTION TUBE FRAZIER 10FR DISP (SUCTIONS) ×2 IMPLANT
SUT ETHILON 3 0 PS 1 (SUTURE) ×8 IMPLANT
SUT VIC AB 0 CT1 36 (SUTURE) ×2 IMPLANT
SUT VIC AB 2-0 CT1 27 (SUTURE) ×2
SUT VIC AB 2-0 CT1 TAPERPNT 27 (SUTURE) ×4 IMPLANT
SYR BULB IRRIG 60ML STRL (SYRINGE) ×2 IMPLANT
SYR CONTROL 10ML LL (SYRINGE) IMPLANT
TOWEL OR 17X26 10 PK STRL BLUE (TOWEL DISPOSABLE) ×2 IMPLANT
UNDERPAD 30X36 HEAVY ABSORB (UNDERPADS AND DIAPERS) ×2 IMPLANT
WATER STERILE IRR 1000ML POUR (IV SOLUTION) ×2 IMPLANT
YANKAUER SUCT BULB TIP NO VENT (SUCTIONS) IMPLANT

## 2023-04-18 NOTE — Discharge Instructions (Signed)
Orthopedic Discharge Instructions  Diet: As you were doing prior to hospitalization   Shower:  May shower but keep the wounds dry, use an occlusive plastic wrap, NO SOAKING IN TUB.  If the bandage gets wet, change with a clean dry gauze.  If you have a splint on, leave the splint in place and keep the splint dry with a plastic bag.  Dressing:  You may change your dressing 3-5 days after surgery, unless you have a splint.  If the dressing remains clean and dry it can also be left on until follow up. If you change the dressing replace with clean gauze and tape or ace wrap. If you have a splint, then just leave the splint in place and we will change your bandages during your first follow-up appointment.  If water gets in the splint or the splint gets saturated please call the clinic and we can see you to change your splint.  If you had hand or foot surgery, we will plan to remove your stitches in about 2 weeks in the office.  For all other surgeries, there are sticky tapes (steri-strips) on your wounds and all the stitches are absorbable.  Leave the steri-strips in place when changing your dressings, they will peel off with time, usually 2-3 weeks.  Activity:  Increase activity slowly as tolerated, but follow the weight bearing instructions below.  The rules on driving is that you can not be taking narcotics while you drive, and you must feel in control of the vehicle.    Weight Bearing:   no weight bearing on the right leg.    Blood clot prevention (DVT Prophylaxis): After surgery you are at an increased risk for a blood clot. you were prescribed a blood thinner, aspirin 81mg , to be taken once daily for a total of 4 weeks from surgery to help reduce your risk of getting a blood clot. This will help prevent a blood clot. Signs of a pulmonary embolus (blood clot in the lungs) include sudden short of breath, feeling lightheaded or dizzy, chest pain with a deep breath, rapid pulse rapid breathing. Signs of a  blood clot in your arms or legs include new unexplained swelling and cramping, warm, red or darkened skin around the painful area. Please call the office or 911 right away if these signs or symptoms develop. To prevent constipation: you may use a stool softener such as -  Colace (over the counter) 100 mg by mouth twice a day  Drink plenty of fluids (prune juice may be helpful) and high fiber foods Miralax (over the counter) for constipation as needed.    Itching:  If you experience itching with your medications, try taking only a single pain pill, or even half a pain pill at a time.  You may take up to 10 pain pills per day, and you can also use benadryl over the counter for itching or also to help with sleep.   Precautions:  If you experience chest pain or shortness of breath - call 911 immediately for transfer to the hospital emergency department!!   Call office [phone]) for the following: Temperature greater than 101F Persistent nausea and vomiting Severe uncontrolled pain Redness, tenderness, or signs of infection (pain, swelling, redness, odor or green/yellow discharge around the site) Difficulty breathing, headache or visual disturbances Hives Persistent dizziness or light-headedness Extreme fatigue Any other questions or concerns you may have after discharge  In an emergency, call 911 or go to an Emergency Department at a  nearby hospital  Follow- Up Appointment:  Please call for an appointment to be seen approximately 2-3 week after surgery in [facility] with your surgeon Dr. Weber Cooks - [phone] Address: [address], [place], [place] [address]

## 2023-04-18 NOTE — Anesthesia Procedure Notes (Signed)
Anesthesia Regional Block: Popliteal block   Pre-Anesthetic Checklist: , timeout performed,  Correct Patient, Correct Site, Correct Laterality,  Correct Procedure, Correct Position, site marked,  Risks and benefits discussed,  Surgical consent,  Pre-op evaluation,  At surgeon's request and post-op pain management  Laterality: Right  Prep: chloraprep       Needles:  Injection technique: Single-shot  Needle Type: Echogenic Stimulator Needle          Additional Needles:   Procedures:, nerve stimulator,,,,,     Nerve Stimulator or Paresthesia:  Response: plantar flexion of foot, 0.45 mA  Additional Responses:   Narrative:  Start time: 04/18/2023 12:01 PM End time: 04/18/2023 12:10 PM Injection made incrementally with aspirations every 5 mL.  Performed by: Personally  Anesthesiologist: Achille Rich, MD  Additional Notes: Functioning IV was confirmed and monitors were applied.  A 90mm 21ga Arrow echogenic stimulator needle was used. Sterile prep and drape,hand hygiene and sterile gloves were used.  Negative aspiration and negative test dose prior to incremental administration of local anesthetic. The patient tolerated the procedure well.  Ultrasound guidance: relevent anatomy identified, needle position confirmed, local anesthetic spread visualized around nerve(s), vascular puncture avoided.  Image printed for medical record.

## 2023-04-18 NOTE — Anesthesia Procedure Notes (Signed)
Anesthesia Regional Block: Adductor canal block   Pre-Anesthetic Checklist: , timeout performed,  Correct Patient, Correct Site, Correct Laterality,  Correct Procedure, Correct Position, site marked,  Risks and benefits discussed,  Surgical consent,  Pre-op evaluation,  At surgeon's request and post-op pain management  Laterality: Right  Prep: chloraprep       Needles:  Injection technique: Single-shot  Needle Type: Echogenic Needle     Needle Length: 9cm  Needle Gauge: 21     Additional Needles:   Narrative:  Start time: 04/18/2023 12:13 PM End time: 04/18/2023 12:20 PM Injection made incrementally with aspirations every 5 mL.  Performed by: Personally  Anesthesiologist: Achille Rich, MD  Additional Notes: Pt tolerated the procedure well.

## 2023-04-18 NOTE — H&P (Addendum)
ORTHOPAEDIC CONSULTATION  REQUESTING PHYSICIAN: Joen Laura, MD  Chief Complaint: R ankle fx  HPI: [name] is a 62 y.o. female who was walking and slipped in some water and fell. She had immediate right ankle pain and could not bear weight. She was brought to the ED where x-rays showed and ankle fx.  She under closed reduction and splinting in the ED.  Past Medical History:  Diagnosis Date   Adrenal adenoma 01/11/2021   Anemia    CHF (congestive heart failure) (HCC)    Chronic bronchitis (HCC)    "probably once/yr" (05/12/2013)   Chronic kidney disease    Diastolic heart failure    Dyslipidemia    pt denies this hx on 05/12/2013   Exertional shortness of breath    GERD (gastroesophageal reflux disease)    Heart murmur    Hypertension    Hypokalemia    Left ventricular hypertrophy 01/11/2021   Venous stasis ulcers (HCC)    Past Surgical History:  Procedure Laterality Date   BIOPSY  02/23/2023   Procedure: BIOPSY;  Surgeon: Lemar Lofty., MD;  Location: [facility] ENDOSCOPY;  Service: Gastroenterology;;   COLONOSCOPY N/A 02/23/2023   Procedure: COLONOSCOPY;  Surgeon: Lemar Lofty., MD;  Location: [facility] ENDOSCOPY;  Service: Gastroenterology;  Laterality: N/A;   ESOPHAGOGASTRODUODENOSCOPY N/A 02/23/2023   Procedure: ESOPHAGOGASTRODUODENOSCOPY (EGD);  Surgeon: Lemar Lofty., MD;  Location: [facility] ENDOSCOPY;  Service: Gastroenterology;  Laterality: N/A;   HOT HEMOSTASIS N/A 02/23/2023   Procedure: HOT HEMOSTASIS (ARGON PLASMA COAGULATION/BICAP);  Surgeon: Lemar Lofty., MD;  Location: [facility] ENDOSCOPY;  Service: Gastroenterology;  Laterality: N/A;   IR US GUIDE VASC ACCESS RIGHT  02/27/2017   IR VENOGRAM ADRENAL BI  02/27/2017   IR VENOGRAM HEPATIC WO HEMODYNAMIC EVALUATION  02/27/2017   IR VENOGRAM RENAL BI  02/27/2017   IR VENOUS SAMPLING  02/27/2017   IR VENOUS SAMPLING  02/27/2017   POLYPECTOMY  02/23/2023   Procedure: POLYPECTOMY;  Surgeon: Mansouraty,  Netty Starring., MD;  Location: [facility] ENDOSCOPY;  Service: Gastroenterology;;   VAGINAL HYSTERECTOMY  [phone]   Social History   Socioeconomic History   Marital status: Single    Spouse name: Not on file   Number of children: Not on file   Years of education: Not on file   Highest education level: Not on file  Occupational History   Occupation: disability  Tobacco Use   Smoking status: Every Day    Current packs/day: 0.50    Average packs/day: 0.5 packs/day for 10.0 years (5.0 ttl pk-yrs)    Types: Cigarettes   Smokeless tobacco: Never   Tobacco comments:    trying to cut back, 3/29 - only had one cigarette today, average 4 a day  Vaping Use   Vaping status: Never Used  Substance and Sexual Activity   Alcohol use: Not Currently   Drug use: Not Currently    Types: Cocaine    Comment: 05/12/2013 "tried it a few days ago; didn't like it"   Sexual activity: Not Currently  Other Topics Concern   Not on file  Social History Narrative   Not on file   Social Determinants of Health   Financial Resource Strain: High Risk (11/06/2022)   Overall Financial Resource Strain (CARDIA)    Difficulty of Paying Living Expenses: Very hard  Food Insecurity: No Food Insecurity (02/27/2023)   Hunger Vital Sign    Worried About Running Out of Food in the Last Year: Never true    Ran  Out of Food in the Last Year: Never true  Transportation Needs: No Transportation Needs (02/27/2023)   PRAPARE - Administrator, Civil Service (Medical): No    Lack of Transportation (Non-Medical): No  Physical Activity: Insufficiently Active (11/06/2022)   Exercise Vital Sign    Days of Exercise per Week: 5 days    Minutes of Exercise per Session: 20 min  Stress: No Stress Concern Present (11/06/2022)   [employer] of Occupational Health - Occupational Stress Questionnaire    Feeling of Stress : Only a little  Social Connections: Moderately Isolated (11/06/2022)   Social Connection and Isolation Panel  [NHANES]    Frequency of Communication with Friends and Family: Twice a week    Frequency of Social Gatherings with Friends and Family: Once a week    Attends Religious Services: More than 4 times per year    Active Member of [employer] or Organizations: No    Attends Engineer, structural: Never    Marital Status: Never married   Family History  Problem Relation Age of Onset   Hypertension Mother    Kidney failure Mother    Heart attack Father    Hypertension Sister    Diabetes Sister    Hypertension Brother    Diabetes Sister    Allergies  Allergen Reactions   Lisinopril Cough     Positive ROS: All other systems have been reviewed and were otherwise negative with the exception of those mentioned in the HPI and as above.  Physical Exam: General: Alert, no acute distress Cardiovascular: No pedal edema Respiratory: No cyanosis, no use of accessory musculature Skin: No lesions in the area of chief complaint Neurologic: Sensation intact distally Psychiatric: Patient is competent for consent with normal mood and affect  MUSCULOSKELETAL:   RLE Splint c/d/i  No groin pain with log roll  No knee effusion  Sens DPN, SPN, TN intact  Motor EHL, FHL 5/5  Toe WWP, brisk cap refill, No significant edema    Assessment: Right bimalleolar ankle fracture   Plan: Given patient's significant preop anemia will plan for overnight observation and to recheck her hemoglobin in the morning.  The risks benefits and alternatives were discussed with the patient including but not limited to the risks of nonoperative treatment, versus surgical intervention including infection, bleeding, nerve injury, malunion, nonunion, the need for revision surgery, hardware prominence, hardware failure, the need for hardware removal, blood clots, cardiopulmonary complications, morbidity, mortality, among others, and they were willing to proceed.  Consent was signed by myself and the patient.  Right leg was  marked.    Joen Laura, MD  Contact information:   [email or web address] 7am-5pm epic message Dr. Blanchie Dessert, or call office for patient follow up: [phone] After hours and holidays please check [email or web address] for group call information for [facility]

## 2023-04-18 NOTE — Evaluation (Signed)
Physical Therapy Evaluation Patient Details Name: [name] MRN: [id number] DOB: 07/24/1961 Today's Date: 04/18/2023  History of Present Illness  62 yo female presents to therapy s/p ORIF with syndesmotic fixation of R bimalleolar fx on 04/18/2023 due to mechanical fall on 04/09/2023. Pt is currently R LE  NWB in splint. Pt PMH includes but is not limited to: adrenal adenoma, anemia, CHF, CKD, DOE, GERD, HTN, hypokalemia, and tobacco abuse.  Clinical Impression     [name] is a 62 y.o. female POD 0 s/p R ankle ORIF. Patient reports IND with mobility at baseline prior to 04/09/2023 and since that time pt has been able to navigate in home with use of manual wc and perform transfers and ADLs mod I. Patient is now limited by functional impairments (see PT problem list below) and requires CGA for bed mobility and mod A x 2  for transfers at [facility] and strong multimodal cues for R LE NWB. P Patient will benefit from continued skilled PT interventions to address impairments and progress towards PLOF. Acute PT will follow to progress mobility and stair training in preparation for safe discharge home with ongoing family support and [facility] services.       If plan is discharge home, recommend the following: Two people to help with walking and/or transfers;A little help with bathing/dressing/bathroom;Assistance with cooking/housework;Assist for transportation;Help with stairs or ramp for entrance   Can travel by private vehicle        Equipment Recommendations None recommended by PT  Recommendations for Other Services       Functional Status Assessment Patient has had a recent decline in their functional status and demonstrates the ability to make significant improvements in function in a reasonable and predictable amount of time.     Precautions / Restrictions Precautions Precautions: Fall Restrictions Weight Bearing Restrictions: Yes RLE Weight Bearing: Non weight bearing       Mobility  Bed Mobility Overal bed mobility: Needs Assistance Bed Mobility: Supine to Sit     Supine to sit: Contact guard, HOB elevated     General bed mobility comments: min cues, pt able to transition to log sit from flat surface as well    Transfers Overall transfer level: Needs assistance Equipment used: Rolling walker (2 wheels) Transfers: Bed to chair/wheelchair/BSC   Stand pivot transfers: Mod assist, +2 physical assistance, +2 safety/equipment, From elevated surface         General transfer comment: mod A x 2 for pull to stand from elevated EOB, stong cues through out transfer tasks to maintain R LE NWB status, PT placed foot under pts R LE to provide immediate feedback and cues. pt exhibited difficulty maintaining R LE NWB status with transitioning sit <> stand regardless of cues and will benefit from ongoing instruction and reinforcement    Ambulation/Gait               General Gait Details: NT  Stairs            Wheelchair Mobility     Tilt Bed    Modified Rankin (Stroke Patients Only)       Balance Overall balance assessment: Needs assistance, History of Falls (1 fall past 6 months) Sitting-balance support: Single extremity supported (L LE support) Sitting balance-Leahy Scale: Fair     Standing balance support: Bilateral upper extremity supported, During functional activity, Reliant on assistive device for balance Standing balance-Leahy Scale: Zero  Pertinent Vitals/Pain Pain Assessment Pain Assessment: 0-10 Pain Score: 8  Pain Location: R ankle Pain Descriptors / Indicators: Throbbing, Tightness, Constant, Discomfort Pain Intervention(s): Limited activity within patient's tolerance, Monitored during session, Premedicated before session, Repositioned    Home Living Family/patient expects to be discharged to:: Private residence Living Arrangements: Children Available Help at Discharge:  Family Type of Home: Apartment Home Access: Level entry       Home Layout: One level Home Equipment: Wheelchair - Forensic psychologist (2 wheels);Crutches (loaning) Additional Comments: pt required use of RW since R ankle fx 8/19 due to R LE NWB status prior to surgery  and reports wc will fit through out home    Prior Function Prior Level of Function : Working/employed;Independent/Modified Independent;Driving             Mobility Comments: IND no AD for all ADLs, self care tasks, IADLs, driving       Extremity/Trunk Assessment        Lower Extremity Assessment Lower Extremity Assessment: RLE deficits/detail RLE Deficits / Details: ankle NT due to fx, knee and hip R LE MMT and ROM WFL RLE Sensation: decreased light touch    Cervical / Trunk Assessment Cervical / Trunk Assessment: Normal  Communication   Communication Communication: No apparent difficulties  Cognition Arousal: Alert Behavior During Therapy: WFL for tasks assessed/performed Overall Cognitive Status: Within Functional Limits for tasks assessed                                          General Comments      Exercises     Assessment/Plan    PT Assessment Patient needs continued PT services  PT Problem List Decreased strength;Decreased range of motion;Decreased activity tolerance;Decreased balance;Decreased mobility;Decreased coordination;Pain       PT Treatment Interventions DME instruction;Gait training;Functional mobility training;Therapeutic activities;Therapeutic exercise;Balance training;Neuromuscular re-education;Patient/family education;Modalities    PT Goals (Current goals can be found in the Care Plan section)  Acute Rehab PT Goals Patient Stated Goal: going back to work 3 d/wk PT Goal Formulation: With patient Time For Goal Achievement: 05/02/23 Potential to Achieve Goals: Good    Frequency Min 1X/week     Co-evaluation               AM-PAC PT "6  Clicks" Mobility  Outcome Measure Help needed turning from your back to your side while in a flat bed without using bedrails?: None Help needed moving from lying on your back to sitting on the side of a flat bed without using bedrails?: A Little Help needed moving to and from a bed to a chair (including a wheelchair)?: A Lot Help needed standing up from a chair using your arms (e.g., wheelchair or bedside chair)?: A Lot Help needed to walk in hospital room?: Total Help needed climbing 3-5 steps with a railing? : Total 6 Click Score: 13    End of Session Equipment Utilized During Treatment: Gait belt Activity Tolerance: Patient tolerated treatment well Patient left: in chair;with call bell/phone within reach;with family/visitor present;with chair alarm set Nurse Communication: Mobility status;Weight bearing status PT Visit Diagnosis: Unsteadiness on feet (R26.81);Other abnormalities of gait and mobility (R26.89);Muscle weakness (generalized) (M62.81);History of falling (Z91.81);Difficulty in walking, not elsewhere classified (R26.2);Pain Pain - Right/Left: Right Pain - part of body: Ankle and joints of foot    Time: 5284-1324 PT Time Calculation (min) (ACUTE ONLY): 24 min  Charges:   PT Evaluation $PT Eval Low Complexity: 1 Low PT Treatments $Therapeutic Activity: 8-22 mins PT General Charges $$ ACUTE PT VISIT: 1 Visit         Johnny Bridge, PT Acute Rehab   Jacqualyn Posey 04/18/2023, 5:58 PM

## 2023-04-18 NOTE — Anesthesia Procedure Notes (Addendum)
Procedure Name: Intubation Date/Time: 04/18/2023 12:17 PM  Performed by: Ahmed Prima, CRNAPre-anesthesia Checklist: Patient identified, Emergency Drugs available, Suction available and Patient being monitored Patient Re-evaluated:Patient Re-evaluated prior to induction Oxygen Delivery Method: Circle system utilized Preoxygenation: Pre-oxygenation with 100% oxygen Induction Type: IV induction Ventilation: Oral airway inserted - appropriate to patient size and Mask ventilation without difficulty Laryngoscope Size: Glidescope, Mac and 3 Grade View: Grade II Tube type: Oral Tube size: 7.0 mm Number of attempts: 1 Airway Equipment and Method: Stylet, Oral airway and Video-laryngoscopy Placement Confirmation: ETT inserted through vocal cords under direct vision, positive ETCO2 and breath sounds checked- equal and bilateral Secured at: 22 cm Tube secured with: Tape Dental Injury: Teeth and Oropharynx as per pre-operative assessment

## 2023-04-18 NOTE — Transfer of Care (Signed)
Immediate Anesthesia Transfer of Care Note  Patient: [name]  Procedure(s) Performed: OPEN REDUCTION INTERNAL FIXATION (ORIF) ANKLE FRACTURE (Right: Ankle)  Patient Location: PACU  Anesthesia Type:General  Level of Consciousness: awake, alert , and oriented  Airway & Oxygen Therapy: Patient Spontanous Breathing and Patient connected to face mask oxygen  Post-op Assessment: Report given to RN, Post -op Vital signs reviewed and stable, and Patient moving all extremities X 4  Post vital signs: Reviewed and stable  Last Vitals:  Vitals Value Taken Time  BP 164/91   Temp    Pulse 71 04/18/23 1528  Resp 18 04/18/23 1528  SpO2 100 % 04/18/23 1528  Vitals shown include unfiled device data.  Last Pain:  Vitals:   04/18/23 1220  TempSrc:   PainSc: 5          Complications: No notable events documented.

## 2023-04-18 NOTE — Op Note (Signed)
04/18/2023  PATIENT:  [name]    PRE-OPERATIVE DIAGNOSIS:  RIGHT BIMALLEOLAR ANKLE FRACTURE  POST-OPERATIVE DIAGNOSIS:  Same  PROCEDURE:  OPEN REDUCTION INTERNAL FIXATION (ORIF) ANKLE FRACTURE with syndesmotic fixation  SURGEON:  Tonea Leiphart A Zayyan Mullen, MD  PHYSICIAN ASSISTANT: Darron Doom, RNFA, present and scrubbed throughout the case, critical for completion in a timely fashion, and for retraction, instrumentation, and closure.  ANESTHESIA:   General  ESTIMATED BLOOD LOSS: 50cc  PREOPERATIVE INDICATIONS:  [name] is a  62 y.o. female with a diagnosis of RIGHT ANKLE FRACTURE who elected for surgical management to minimize the risk for malunion and nonunion and post-traumatic arthritis.    The risks benefits and alternatives were discussed with the patient preoperatively including but not limited to the risks of infection, bleeding, nerve injury, cardiopulmonary complications, the need for revision surgery, the need for hardware removal, among others, and the patient was willing to proceed.  OPERATIVE IMPLANTS:  Implant Name Type Inv. Item Serial No. Manufacturer Lot No. LRB No. Used Action  IMPLANT TIGHTROPE W/DRV K-LESS - [id number] Anchor IMPLANT TIGHTROPE W/DRV [employer] [id number] Right 1 Implanted  PLATE DIST FIB 8H - [id number] Plate PLATE DIST FIB 8H  [employer]  Right 1 Implanted  SCREW LOW PROFILE CANN 4.0X45 - [id number] Screw SCREW LOW PROFILE CANN 4.0X45  [employer]  Right 2 Implanted  SCREW LOW PROFILE 3.5X16 - [id number] Screw SCREW LOW PROFILE 3.5X16  [employer]  Right 2 Implanted  SCREW NON-LOCKING 3.5X20 ANKLE - [id number] Screw SCREW NON-LOCKING 3.5X20 ANKLE  [employer]  Right 1 Implanted  SCREW CORT 2.7X20 - [id number] Screw SCREW CORT 2.7X20  [employer]  Right 1 Implanted  SCREW COMP KREULOCK 2.7X10 - [id number] Screw SCREW COMP KREULOCK 2.7X10  [employer]  Right 1 Implanted  SCREW COMP KREULOCK 2.7X14 - [id number] Screw SCREW COMP  KREULOCK 2.7X14  [employer]  Right 1 Implanted  SCREW COMP KREULOCK 2.7X16 - [id number] Screw SCREW COMP KREULOCK 2.7X16  [employer]  Right 1 Implanted    OPERATIVE PROCEDURE: The patient was brought to the operating room and placed in the supine position. All bony prominences were padded. Non sterile Tourniquet was placed but not inflated. General anesthesia was administered. The lower extremity was prepped and draped in the usual sterile fashion.  Time out was performed.   Incision was made over the distal fibula and the fracture was partially exposed. Given severe comminution there was no cortical keys so elected to bridge the fracture. Dissection for the plate proximally was performed with metz to bluntly spread and to protect crossing branches of the superficial peroneal nerve. I then applied an appropriately sized distal fibular locking plate and secured it proximally with non locking screws and distally with nonlocking screws. Traction and internal rotation were used to restore length. Once we felt we had length and alignment restored the proximal holes were filled with 3 locking and 1 nonlocking at the top. Distally 4 additional locking screws were placed. I used c-arm to confirm satisfactory reduction and fixation.   I then turned my attention to the medial malleolus. Incision was made over the medial malleolus and the fracture exposed and held provisionally with a clamp.  A guidepin was placed for the 4.0 mm cannulated screw and then confirmation of reduction was made with fluoroscopy. I then placed two 45mm screws which had satisfactory fixation.   This point we turned our attention to the tight rope portion of the procedure we went back  to the lateral side of the ankle and placed a guidepin through the distal most hole proximally 1 and 1/2 cm above the joint line from posterior lateral to anterior medial in the direction of the syndesmosis. It was driven through the medial incision and were  able to visually check its placement medially and laterally. Fluoroscopy-guided our placement. We then overdrilled the pin with the supplied drill. We then passed our tight rope through the tunnel was created and verified that the button was flipped visually as it was within our medial incision.this point we started to tension the tight rope confirming reduction of the syndesmosis as we tensioned it maximally. Post fixation stress views demonstrate a stable syndesmosis.   The wounds were irrigated, and closed with 0 vicryl, 2-0 vicryl.  3-0 nylon closure for the skin. The wounds were injected with local anesthetic. Adaptic and Sterile gauze was applied followed by a posterior splint. She was awakened and returned to the PACU in stable and satisfactory condition. There were no complications.  Post op recs: WB: NWB RLE in splint Imaging: PACU xrays Dressing: keep splint intact until follow up DVT prophylaxis: aspirin x4 weeks Follow up: 2 weeks after surgery for a wound check and suture removal with Dr. Blanchie Dessert at [facility].  Address: [address], [place], [place] [address]  Office Phone: [phone]  Weber Cooks, MD Orthopaedic Surgery

## 2023-04-19 DIAGNOSIS — I130 Hypertensive heart and chronic kidney disease with heart failure and stage 1 through stage 4 chronic kidney disease, or unspecified chronic kidney disease: Secondary | ICD-10-CM | POA: Diagnosis not present

## 2023-04-19 DIAGNOSIS — R2681 Unsteadiness on feet: Secondary | ICD-10-CM | POA: Diagnosis not present

## 2023-04-19 DIAGNOSIS — R262 Difficulty in walking, not elsewhere classified: Secondary | ICD-10-CM | POA: Diagnosis not present

## 2023-04-19 DIAGNOSIS — I5030 Unspecified diastolic (congestive) heart failure: Secondary | ICD-10-CM | POA: Diagnosis not present

## 2023-04-19 DIAGNOSIS — S82841A Displaced bimalleolar fracture of right lower leg, initial encounter for closed fracture: Secondary | ICD-10-CM | POA: Diagnosis not present

## 2023-04-19 DIAGNOSIS — Z9181 History of falling: Secondary | ICD-10-CM | POA: Diagnosis not present

## 2023-04-19 DIAGNOSIS — F17210 Nicotine dependence, cigarettes, uncomplicated: Secondary | ICD-10-CM | POA: Diagnosis not present

## 2023-04-19 DIAGNOSIS — N189 Chronic kidney disease, unspecified: Secondary | ICD-10-CM | POA: Diagnosis not present

## 2023-04-19 DIAGNOSIS — R2689 Other abnormalities of gait and mobility: Secondary | ICD-10-CM | POA: Diagnosis not present

## 2023-04-19 DIAGNOSIS — M6281 Muscle weakness (generalized): Secondary | ICD-10-CM | POA: Diagnosis not present

## 2023-04-19 LAB — BASIC METABOLIC PANEL
Anion gap: 10 (ref 5–15)
BUN: 41 mg/dL — ABNORMAL HIGH (ref 8–23)
CO2: 19 mmol/L — ABNORMAL LOW (ref 22–32)
Calcium: 8.2 mg/dL — ABNORMAL LOW (ref 8.9–10.3)
Chloride: 108 mmol/L (ref 98–111)
Creatinine, Ser: 2.27 mg/dL — ABNORMAL HIGH (ref 0.44–1.00)
GFR, Estimated: 24 mL/min — ABNORMAL LOW (ref >60)
Glucose, Bld: 153 mg/dL — ABNORMAL HIGH (ref 70–99)
Potassium: 4.4 mmol/L (ref 3.5–5.1)
Sodium: 137 mmol/L (ref 135–145)

## 2023-04-19 LAB — CBC
HCT: 27.2 % — ABNORMAL LOW (ref 36.0–46.0)
Hemoglobin: 7.7 g/dL — ABNORMAL LOW (ref 12.0–15.0)
MCH: 24.9 pg — ABNORMAL LOW (ref 26.0–34.0)
MCHC: 28.3 g/dL — ABNORMAL LOW (ref 30.0–36.0)
MCV: 88 fL (ref 80.0–100.0)
Platelets: 338 10*3/uL (ref 150–400)
RBC: 3.09 MIL/uL — ABNORMAL LOW (ref 3.87–5.11)
RDW: 19.8 % — ABNORMAL HIGH (ref 11.5–15.5)
WBC: 8.1 10*3/uL (ref 4.0–10.5)
nRBC: 0 % (ref 0.0–0.2)

## 2023-04-19 MED ORDER — ONDANSETRON HCL 4 MG PO TABS: 4.0000 mg | ORAL_TABLET | Freq: Three times a day (TID) | ORAL | 0 refills | Status: AC | PRN

## 2023-04-19 MED ORDER — ASPIRIN 81 MG PO TBEC: 81.0000 mg | DELAYED_RELEASE_TABLET | Freq: Every day | ORAL | Status: DC

## 2023-04-19 MED ORDER — METHOCARBAMOL 500 MG PO TABS: 500.0000 mg | ORAL_TABLET | Freq: Three times a day (TID) | ORAL | 0 refills | Status: AC | PRN

## 2023-04-19 MED ORDER — OXYCODONE HCL 5 MG PO TABS: 5.0000 mg | ORAL_TABLET | ORAL | 0 refills | Status: AC | PRN

## 2023-04-19 NOTE — Progress Notes (Signed)
Physical Therapy Treatment Patient Details Name: [name] MRN: [id number] DOB: 1960-11-27 Today's Date: 04/19/2023   History of Present Illness 62 yo female presents to therapy s/p ORIF with syndesmotic fixation of R bimalleolar fx on 04/18/2023 due to mechanical fall on 04/09/2023. Pt is currently R LE  NWB in splint. Pt PMH includes but is not limited to: adrenal adenoma, anemia, CHF, CKD, DOE, GERD, HTN, hypokalemia, and tobacco abuse.    PT Comments  Pt progressing well and feels ready to d/c home; pt's friend has secured a w/c with ELRs from their church. She has a walker as well. Reviewed reasoning and importance of NWB. Reviewed importance of RLE elevation. Pt  verbalizes understanding. Pt plans to continue staying with her dtr and then move to a different apt of her own with no steps.   If plan is discharge home, recommend the following: A little help with bathing/dressing/bathroom;A little help with walking and/or transfers;Assist for transportation;Help with stairs or ramp for entrance   Can travel by private vehicle        Equipment Recommendations  None recommended by PT    Recommendations for Other Services       Precautions / Restrictions Precautions Precautions: Fall Restrictions RLE Weight Bearing: Non weight bearing     Mobility  Bed Mobility               General bed mobility comments: in recliner    Transfers Overall transfer level: Needs assistance Equipment used: Rolling walker (2 wheels) Transfers: Sit to/from Stand Sit to Stand: Contact guard assist           General transfer comment: cues for RLE position/NWB    Ambulation/Gait Ambulation/Gait assistance: Contact guard assist Gait Distance (Feet): 6 Feet Assistive device: Rolling walker (2 wheels)         General Gait Details: cues for RLE position/NWB. with cues pt is able to adhere to NWB, fatigues requiring seated rest   Stairs             Wheelchair Mobility      Tilt Bed    Modified Rankin (Stroke Patients Only)       Balance   Sitting-balance support: No upper extremity supported Sitting balance-Leahy Scale: Good       Standing balance-Leahy Scale: Poor                              Cognition Arousal: Alert Behavior During Therapy: WFL for tasks assessed/performed Overall Cognitive Status: Within Functional Limits for tasks assessed                                          Exercises      General Comments        Pertinent Vitals/Pain Pain Assessment Pain Assessment: 0-10 Pain Score: 3  Pain Location: R ankle Pain Descriptors / Indicators: Discomfort, Sore Pain Intervention(s): Limited activity within patient's tolerance, Monitored during session, Premedicated before session, Repositioned    Home Living                          Prior Function            PT Goals (current goals can now be found in the care plan section) Acute Rehab PT Goals Patient Stated Goal: going back  to work 3 d/wk PT Goal Formulation: With patient Time For Goal Achievement: 05/02/23 Potential to Achieve Goals: Good Progress towards PT goals: Progressing toward goals    Frequency    Min 1X/week      PT Plan      Co-evaluation              AM-PAC PT "6 Clicks" Mobility   Outcome Measure  Help needed turning from your back to your side while in a flat bed without using bedrails?: None Help needed moving from lying on your back to sitting on the side of a flat bed without using bedrails?: None Help needed moving to and from a bed to a chair (including a wheelchair)?: A Little Help needed standing up from a chair using your arms (e.g., wheelchair or bedside chair)?: A Little Help needed to walk in hospital room?: A Little Help needed climbing 3-5 steps with a railing? : Total 6 Click Score: 18    End of Session Equipment Utilized During Treatment: Gait belt Activity Tolerance:  Patient tolerated treatment well Patient left: in chair;with call bell/phone within reach   PT Visit Diagnosis: Unsteadiness on feet (R26.81);Other abnormalities of gait and mobility (R26.89);Muscle weakness (generalized) (M62.81);History of falling (Z91.81);Difficulty in walking, not elsewhere classified (R26.2);Pain Pain - Right/Left: Right Pain - part of body: Ankle and joints of foot     Time: 4098-1191 PT Time Calculation (min) (ACUTE ONLY): 12 min  Charges:    $Gait Training: 8-22 mins PT General Charges $$ ACUTE PT VISIT: 1 Visit                     Barbaraann Avans, PT  Acute Rehab Dept [facility]) [phone]  04/19/2023    [facility] 04/19/2023, 1:54 PM

## 2023-04-19 NOTE — TOC Transition Note (Signed)
Transition of Care [facility]) - CM/SW Discharge Note  Patient Details  Name: [name] MRN: [id number] Date of Birth: 08-28-1960  Transition of Care [facility]) CM/SW Contact:  Ewing Schlein, LCSW Phone Number: 04/19/2023, 12:02 PM  Clinical Narrative: Patient is expected to discharge home after working with PT. CSW spoke with patient regarding discharge plan. Patient will be set up with PT after discharge once her weight bearing status advances. Patient has a rolling walker and wheelchair at home, so there are no DME needs at this time. TOC signing off.  Final next level of care: Home/Self Care Barriers to Discharge: No Barriers Identified  Patient Goals and CMS Choice Choice offered to / list presented to : NA  Discharge Plan and Services Additional resources added to the After Visit Summary for            DME Arranged: N/A DME Agency: NA  Social Determinants of Health (SDOH) Interventions SDOH Screenings   Food Insecurity: No Food Insecurity (04/18/2023)  Housing: Patient Declined (04/18/2023)  Transportation Needs: No Transportation Needs (04/18/2023)  Utilities: Not At Risk (04/18/2023)  Depression (PHQ2-9): Low Risk  (11/06/2022)  Financial Resource Strain: High Risk (11/06/2022)  Physical Activity: Insufficiently Active (11/06/2022)  Social Connections: Moderately Isolated (11/06/2022)  Stress: No Stress Concern Present (11/06/2022)  Tobacco Use: High Risk (04/18/2023)   Readmission Risk Interventions     No data to display

## 2023-04-19 NOTE — Plan of Care (Signed)

## 2023-04-19 NOTE — Discharge Summary (Signed)
Physician Discharge Summary  Patient ID: [name] MRN: [id number] DOB/AGE: [birth year] 62 y.o.  Admit date: 04/18/2023 Discharge date: 04/19/2023  Admission Diagnoses:  Closed right ankle fracture  Discharge Diagnoses:  Principal Problem:   Closed right ankle fracture   Past Medical History:  Diagnosis Date   Adrenal adenoma 01/11/2021   Anemia    CHF (congestive heart failure) (HCC)    Chronic bronchitis (HCC)    "probably once/yr" (05/12/2013)   Chronic kidney disease    Diastolic heart failure    Dyslipidemia    pt denies this hx on 05/12/2013   Exertional shortness of breath    GERD (gastroesophageal reflux disease)    Heart murmur    Hypertension    Hypokalemia    Left ventricular hypertrophy 01/11/2021   Venous stasis ulcers (HCC)     Surgeries: Procedure(s): OPEN REDUCTION INTERNAL FIXATION (ORIF) ANKLE FRACTURE on 04/18/2023   Consultants (if any):   Discharged Condition: Improved  Hospital Course: [name] is an 62 y.o. female who was admitted 04/18/2023 with a diagnosis of Closed right ankle fracture and went to the operating room on 04/18/2023 and underwent the above named procedures.    She was given perioperative antibiotics:  Anti-infectives (From admission, onward)    Start     Dose/Rate Route Frequency Ordered Stop   04/18/23 1900  ceFAZolin (ANCEF) IVPB 2g/100 mL premix        2 g 200 mL/hr over 30 Minutes Intravenous Every 6 hours 04/18/23 1624 04/19/23 0101   04/18/23 1339  vancomycin (VANCOCIN) powder  Status:  Discontinued          As needed 04/18/23 1339 04/18/23 1616   04/18/23 1030  ceFAZolin (ANCEF) IVPB 2g/100 mL premix        2 g 200 mL/hr over 30 Minutes Intravenous On call to O.R. 04/18/23 1018 04/18/23 1326     .  She was given sequential compression devices, early ambulation, and aspirin for DVT prophylaxis.  She benefited maximally from the hospital stay and there were no complications.    Recent vital signs:   Vitals:   04/19/23 0541 04/19/23 0631  BP: (!) 228/94 (!) 167/131  Pulse: 85 82  Resp: 18 18  Temp: 98.7 F (37.1 C)   SpO2: 99% 99%    Recent laboratory studies:  Lab Results  Component Value Date   HGB 7.7 (L) 04/19/2023   HGB 8.5 (L) 04/18/2023   HGB 8.0 (L) 03/09/2023   Lab Results  Component Value Date   WBC 8.1 04/19/2023   PLT 338 04/19/2023   Lab Results  Component Value Date   INR 1.1 04/18/2023   Lab Results  Component Value Date   NA 137 04/19/2023   K 4.4 04/19/2023   CL 108 04/19/2023   CO2 19 (L) 04/19/2023   BUN 41 (H) 04/19/2023   CREATININE 2.27 (H) 04/19/2023   GLUCOSE 153 (H) 04/19/2023    Discharge Medications:   Allergies as of 04/19/2023       Reactions   Lisinopril Cough        Medication List     STOP taking these medications    HYDROcodone-acetaminophen 5-325 MG tablet Commonly known as: NORCO/VICODIN       TAKE these medications    acetaminophen 325 MG tablet Commonly known as: TYLENOL Take 650 mg by mouth every 6 (six) hours as needed for mild pain or headache.   amLODipine 10 MG tablet Commonly known as: NORVASC Take 1  tablet (10 mg total) by mouth daily.   aspirin EC 81 MG tablet Take 1 tablet (81 mg total) by mouth daily for 28 days. Swallow whole. Start taking on: April 20, 2023   atorvastatin 40 MG tablet Commonly known as: LIPITOR TAKE 1 TABLET(40 MG) BY MOUTH DAILY   carvedilol 6.25 MG tablet Commonly known as: COREG Take 1 tablet (6.25 mg total) by mouth 2 (two) times daily with a meal.   Farxiga 10 MG Tabs tablet Generic drug: dapagliflozin propanediol Take 1 tablet (10 mg total) by mouth daily.   ferrous gluconate 324 MG tablet Commonly known as: FERGON Take 1 tablet (324 mg total) by mouth daily with breakfast.   gabapentin 100 MG capsule Commonly known as: NEURONTIN Take 2 capsules (200 mg total) by mouth 2 (two) times daily. What changed:  when to take this reasons to take this    hydrALAZINE 50 MG tablet Commonly known as: APRESOLINE Take 1 tablet (50 mg total) by mouth 3 (three) times daily.   hydrocortisone 25 MG suppository Commonly known as: ANUSOL-HC Place 1 suppository (25 mg total) rectally every 12 (twelve) hours.   isosorbide mononitrate 60 MG 24 hr tablet Commonly known as: IMDUR Take 1 tablet (60 mg total) by mouth daily.   methocarbamol 500 MG tablet Commonly known as: ROBAXIN Take 1 tablet (500 mg total) by mouth every 8 (eight) hours as needed for up to 10 days for muscle spasms.   ondansetron 4 MG tablet Commonly known as: Zofran Take 1 tablet (4 mg total) by mouth every 8 (eight) hours as needed for nausea or vomiting. What changed: Another medication with the same name was added. Make sure you understand how and when to take each.   ondansetron 4 MG tablet Commonly known as: Zofran Take 1 tablet (4 mg total) by mouth every 8 (eight) hours as needed for up to 14 days for nausea or vomiting. What changed: You were already taking a medication with the same name, and this prescription was added. Make sure you understand how and when to take each.   oxyCODONE 5 MG immediate release tablet Commonly known as: Roxicodone Take 1 tablet (5 mg total) by mouth every 4 (four) hours as needed for up to 7 days for severe pain or moderate pain.   polyethylene glycol 17 g packet Commonly known as: MIRALAX / GLYCOLAX Take 17 g by mouth daily as needed for mild constipation or moderate constipation.   spironolactone 25 MG tablet Commonly known as: ALDACTONE Take 1 tablet (25 mg total) by mouth daily.        Diagnostic Studies: DG Ankle Right Port  Result Date: 04/18/2023 CLINICAL DATA:  ORIF right ankle fracture EXAM: PORTABLE RIGHT ANKLE - 2 VIEW COMPARISON:  04/09/2023 FINDINGS: Frontal, oblique, lateral views of the right ankle are obtained. Cast material obscures underlying bony detail. Plate and screw fixation traverses the right fibular  fracture, with near anatomic alignment. Two cannulated screws traverse the medial malleolar fracture, with near anatomic alignment. Surgical anchor is seen within the medial tibial metaphysis. Ankle mortise is intact. Diffuse soft tissue swelling. IMPRESSION: 1. ORIF of bimalleolar fracture as above.  Near anatomic alignment. Electronically Signed   By: Sharlet Salina M.D.   On: 04/18/2023 16:33   DG Ankle Complete Right  Result Date: 04/18/2023 CLINICAL DATA:  ORIF, intraoperative evaluation EXAM: RIGHT ANKLE - COMPLETE 3+ VIEW COMPARISON:  Pain 1924 FINDINGS: Eleven fluoroscopic images are obtained during the performance of the procedure and  are provided for interpretation only. Lateral plate and screw fixation traverses the distal fibular fracture, with near anatomic alignment. Two cannulated screws are placed across the medial malleolar fracture fragment, with near anatomic alignment. Anchor is placed within the tibia from likely distal tibiofibular syndesmosis fixation. Please refer to the operative report. Fluoroscopy time: 40 seconds, 1.3426 mGy IMPRESSION: 1. ORIF of right ankle fractures as above.  Near anatomic alignment. Electronically Signed   By: Sharlet Salina M.D.   On: 04/18/2023 16:32   DG C-Arm 1-60 Min-No Report  Result Date: 04/18/2023 Fluoroscopy was utilized by the requesting physician.  No radiographic interpretation.   DG C-Arm 1-60 Min-No Report  Result Date: 04/18/2023 Fluoroscopy was utilized by the requesting physician.  No radiographic interpretation.   DG Ankle Right Port  Result Date: 04/09/2023 CLINICAL DATA:  Fracture, postreduction. EXAM: PORTABLE RIGHT ANKLE - 2 VIEW COMPARISON:  Earlier today FINDINGS: Improved alignment of medial malleolar and distal fibular fractures postreduction. Decreased lateral subluxation of the talus, now near anatomic alignment. Overlying splint material limits osseous and soft tissue fine detail. IMPRESSION: Improved alignment of medial  malleolar and distal fibular fractures postreduction. Electronically Signed   By: Narda Rutherford M.D.   On: 04/09/2023 20:14   DG Ankle Complete Right  Result Date: 04/09/2023 CLINICAL DATA:  Ankle fracture dislocation status post external reduction EXAM: RIGHT ANKLE - COMPLETE 3+ VIEW COMPARISON:  04/09/2023 at 11:21 a.m. FINDINGS: Three views of the right ankle status post external reduction plaster splinting demonstrate mildly improved alignment compared to the regional radiographs of 1121 hours. There still about 1.6 cm of medial displacement of the shafts of the tibia and fibula with respect to talus (originally about 2.7 cm), with comminuted but primarily oblique fracture of the lateral malleolus and transverse medial malleolar fractures. Appearance most compatible with stage 4 Weber B fracture. He plaster splint obscures the bony detail. IMPRESSION: 1. Mildly improved alignment of the stage 4 Weber B fracture dislocation of the right ankle status post external reduction. Electronically Signed   By: Gaylyn Rong M.D.   On: 04/09/2023 18:28   DG Ankle 2 Views Right  Result Date: 04/09/2023 CLINICAL DATA:  Fracture dislocation of the ankle status post reduction EXAM: RIGHT ANKLE - 2 VIEW COMPARISON:  Right ankle radiograph dated 04/09/2023 FINDINGS: Status post reduction of ankle fracture or dislocation. Overlying cast obscures fine detail. Persistent fracture dislocation of the ankle joint with slightly decreased lateral translation of the talus relative to the tibia. Persistent angulated, comminuted fracture of the distal fibula. IMPRESSION: Status post reduction of ankle fracture or dislocation with slightly decreased lateral translation of the talus relative to the tibia. Electronically Signed   By: Agustin Cree M.D.   On: 04/09/2023 16:30   DG Ankle Complete Right  Result Date: 04/09/2023 CLINICAL DATA:  Right ankle pain with deformity. Injury. Slipped on liquid flow on floor and twisted  ankle. EXAM: RIGHT ANKLE - COMPLETE 3+ VIEW COMPARISON:  None Available. FINDINGS: There is an acute, comminuted fracture of the distal fibula diaphysis with approximately 34 degree medial apex angulation. There is lateral dislocation of the talus with respect to the distal tibia with widening of the distal tibiofibular clear space. There is an acute transverse fracture of the superior aspect of the medial malleolus with the distal fracture component displaced approximately 2 cm laterally, appearing intact to the talus which is also shifted laterally. Likely old healed fracture of the mid to distal fibular diaphysis. IMPRESSION: 1. Acute, comminuted  fracture of the distal fibular diaphysis with approximately 34 degree medial apex angulation. 2. Acute transverse fracture of the superior aspect of the medial malleolus with the distal fracture component displaced approximately 2 cm laterally. 3. Lateral dislocation of the talus, fractured distal medial malleolus, and fractured distal fibula with respect to the dominant distal tibia. Electronically Signed   By: Neita Garnet M.D.   On: 04/09/2023 13:20    Disposition: Discharge disposition: 01-Home or Self Care       Discharge Instructions     Call MD / Call 911   Complete by: As directed    If you experience chest pain or shortness of breath, CALL 911 and be transported to the hospital emergency room.  If you develope a fever above 101 F, pus (white drainage) or increased drainage or redness at the wound, or calf pain, call your surgeon's office.   Constipation Prevention   Complete by: As directed    Drink plenty of fluids.  Prune juice may be helpful.  You may use a stool softener, such as Colace (over the counter) 100 mg twice a day.  Use MiraLax (over the counter) for constipation as needed.   Diet - low sodium heart healthy   Complete by: As directed    Increase activity slowly as tolerated   Complete by: As directed    Post-operative opioid  taper instructions:   Complete by: As directed    POST-OPERATIVE OPIOID TAPER INSTRUCTIONS: It is important to wean off of your opioid medication as soon as possible. If you do not need pain medication after your surgery it is ok to stop day one. Opioids include: Codeine, Hydrocodone(Norco, Vicodin), Oxycodone(Percocet, oxycontin) and hydromorphone amongst others.  Long term and even short term use of opiods can cause: Increased pain response Dependence Constipation Depression Respiratory depression And more.  Withdrawal symptoms can include Flu like symptoms Nausea, vomiting And more Techniques to manage these symptoms Hydrate well Eat regular healthy meals Stay active Use relaxation techniques(deep breathing, meditating, yoga) Do Not substitute Alcohol to help with tapering If you have been on opioids for less than two weeks and do not have pain than it is ok to stop all together.  Plan to wean off of opioids This plan should start within one week post op of your joint replacement. Maintain the same interval or time between taking each dose and first decrease the dose.  Cut the total daily intake of opioids by one tablet each day Next start to increase the time between doses. The last dose that should be eliminated is the evening dose.           Follow-up Information     Joen Laura, MD Follow up in 2 week(s).   Specialty: Orthopedic Surgery Contact information: [address] [place] [address] [phone]                    Discharge Instructions      Orthopedic Discharge Instructions  Diet: As you were doing prior to hospitalization   Shower:  May shower but keep the wounds dry, use an occlusive plastic wrap, NO SOAKING IN TUB.  If the bandage gets wet, change with a clean dry gauze.  If you have a splint on, leave the splint in place and keep the splint dry with a plastic bag.  Dressing:  You may change your dressing 3-5  days after surgery, unless you have a splint.  If the  dressing remains clean and dry it can also be left on until follow up. If you change the dressing replace with clean gauze and tape or ace wrap. If you have a splint, then just leave the splint in place and we will change your bandages during your first follow-up appointment.  If water gets in the splint or the splint gets saturated please call the clinic and we can see you to change your splint.  If you had hand or foot surgery, we will plan to remove your stitches in about 2 weeks in the office.  For all other surgeries, there are sticky tapes (steri-strips) on your wounds and all the stitches are absorbable.  Leave the steri-strips in place when changing your dressings, they will peel off with time, usually 2-3 weeks.  Activity:  Increase activity slowly as tolerated, but follow the weight bearing instructions below.  The rules on driving is that you can not be taking narcotics while you drive, and you must feel in control of the vehicle.    Weight Bearing:   no weight bearing on the right leg.    Blood clot prevention (DVT Prophylaxis): After surgery you are at an increased risk for a blood clot. you were prescribed a blood thinner, aspirin 81mg , to be taken once daily for a total of 4 weeks from surgery to help reduce your risk of getting a blood clot. This will help prevent a blood clot. Signs of a pulmonary embolus (blood clot in the lungs) include sudden short of breath, feeling lightheaded or dizzy, chest pain with a deep breath, rapid pulse rapid breathing. Signs of a blood clot in your arms or legs include new unexplained swelling and cramping, warm, red or darkened skin around the painful area. Please call the office or 911 right away if these signs or symptoms develop. To prevent constipation: you may use a stool softener such as -  Colace (over the counter) 100 mg by mouth twice a day  Drink plenty of fluids (prune juice may be helpful)  and high fiber foods Miralax (over the counter) for constipation as needed.    Itching:  If you experience itching with your medications, try taking only a single pain pill, or even half a pain pill at a time.  You may take up to 10 pain pills per day, and you can also use benadryl over the counter for itching or also to help with sleep.   Precautions:  If you experience chest pain or shortness of breath - call 911 immediately for transfer to the hospital emergency department!!   Call office [phone]) for the following: Temperature greater than 101F Persistent nausea and vomiting Severe uncontrolled pain Redness, tenderness, or signs of infection (pain, swelling, redness, odor or green/yellow discharge around the site) Difficulty breathing, headache or visual disturbances Hives Persistent dizziness or light-headedness Extreme fatigue Any other questions or concerns you may have after discharge  In an emergency, call 911 or go to an Emergency Department at a nearby hospital  Follow- Up Appointment:  Please call for an appointment to be seen approximately 2-3 week after surgery in [facility] with your surgeon Dr. Weber Cooks - [phone] Address: [address], [place], [place] [address]       Signed: Clarisse Gouge Zyler Hyson 04/19/2023, 6:44 AM

## 2023-04-19 NOTE — Progress Notes (Signed)
Provided discharge education/instructions, all questions and concerns addressed. Pt is not in any distress, discharged home with all of her belongings accompanied by sister.

## 2023-04-19 NOTE — Anesthesia Postprocedure Evaluation (Signed)
Anesthesia Post Note  Patient: [name]  Procedure(s) Performed: OPEN REDUCTION INTERNAL FIXATION (ORIF) ANKLE FRACTURE (Right: Ankle)     Patient location during evaluation: PACU Anesthesia Type: General Level of consciousness: awake and alert Pain management: pain level controlled Vital Signs Assessment: post-procedure vital signs reviewed and stable Respiratory status: spontaneous breathing, nonlabored ventilation and respiratory function stable Cardiovascular status: blood pressure returned to baseline and stable Postop Assessment: no apparent nausea or vomiting Anesthetic complications: no   No notable events documented.  Last Vitals:  Vitals:   04/19/23 0541 04/19/23 0631  BP: (!) 228/94 (!) 167/131  Pulse: 85 82  Resp: 18 18  Temp: 37.1 C   SpO2: 99% 99%    Last Pain:  Vitals:   04/19/23 0541  TempSrc: Oral  PainSc:                  Lowella Curb

## 2023-04-19 NOTE — Progress Notes (Addendum)
     Subjective:  Patient reports pain as mild.  Block starting to wear off. Minimal drop in Hgb to 7.7 this AM. Hypertensive but asymptomatic. Home morning BP meds ordered. Plan for home today.  Objective:   VITALS:   Vitals:   04/18/23 2020 04/19/23 0135 04/19/23 0541 04/19/23 0631  BP: (!) 155/75 (!) 189/85 (!) 228/94 (!) 167/131  Pulse: 71 84 85 82  Resp: 16 12 18 18   Temp: 97.9 F (36.6 C) 98.4 F (36.9 C) 98.7 F (37.1 C)   TempSrc: Oral Oral Oral   SpO2: 100% 98% 99% 99%  Weight:      Height:        Sensation intact distally Intact pulses distally Dorsiflexion/Plantar flexion intact Splint c/d/i   Lab Results  Component Value Date   WBC 8.1 04/19/2023   HGB 7.7 (L) 04/19/2023   HCT 27.2 (L) 04/19/2023   MCV 88.0 04/19/2023   PLT 338 04/19/2023   BMET    Component Value Date/Time   NA 137 04/19/2023 0353   NA 142 02/19/2023 1049   K 4.4 04/19/2023 0353   CL 108 04/19/2023 0353   CO2 19 (L) 04/19/2023 0353   GLUCOSE 153 (H) 04/19/2023 0353   BUN 41 (H) 04/19/2023 0353   BUN 33 (H) 02/19/2023 1049   CREATININE 2.27 (H) 04/19/2023 0353   CREATININE 2.45 (H) 03/09/2023 1448   CREATININE 0.97 11/17/2013 0924   CALCIUM 8.2 (L) 04/19/2023 0353   EGFR 18 (L) 02/19/2023 1049   GFRNONAA 24 (L) 04/19/2023 0353   GFRNONAA 22 (L) 03/09/2023 1448   GFRNONAA 67 11/17/2013 0924    Xray: xrays show ankle fracture well reduced no adverse features  Assessment/Plan: 1 Day Post-Op   Principal Problem:   Closed right ankle fracture  ORIF R ankle fracture 8/28  Post op recs: WB: NWB RLE in splint Imaging: PACU xrays Dressing: keep splint intact until follow up DVT prophylaxis: aspirin x4 weeks Follow up: 2 weeks after surgery for a wound check and suture removal with Dr. Blanchie Dessert at [facility].  Address: [address], [place], [place] [address]  Office Phone: [phone]    Joen Laura 04/19/2023, 6:40 AM   Weber Cooks, MD  Contact information:   [phone] 7am-5pm epic message Dr. Blanchie Dessert, or call office for patient follow up: [phone] After hours and holidays please check [email or web address] for group call information for [facility]

## 2023-04-20 ENCOUNTER — Encounter (HOSPITAL_COMMUNITY): Payer: Self-pay | Admitting: Orthopedic Surgery

## 2023-04-20 ENCOUNTER — Telehealth: Payer: Self-pay | Admitting: *Deleted

## 2023-04-20 NOTE — Transitions of Care (Post Inpatient/ED Visit) (Signed)
   04/20/2023  Name: [name] MRN: [id number] DOB: May 02, 1961  Today's TOC FU Call Status: Today's TOC FU Call Status:: Unsuccessful Call (1st Attempt) Unsuccessful Call (1st Attempt) Date: 04/20/23  Attempted to reach the patient regarding the most recent Inpatient/ED visit.  Follow Up Plan: Additional outreach attempts will be made to reach the patient to complete the Transitions of Care (Post Inpatient/ED visit) call.   Irving Shows [facility], BSN [place]/ Ambulatory Care Management [phone]

## 2023-04-24 ENCOUNTER — Other Ambulatory Visit: Payer: Self-pay | Admitting: *Deleted

## 2023-04-24 ENCOUNTER — Encounter: Payer: Self-pay | Admitting: *Deleted

## 2023-04-24 NOTE — Transitions of Care (Post Inpatient/ED Visit) (Signed)
04/24/2023  Name: [name] MRN: [id number] DOB: Jan 06, 1961  Today's TOC FU Call Status: Today's TOC FU Call Status:: Successful TOC FU Call Completed TOC FU Call Complete Date: 04/24/23 Patient's Name and Date of Birth confirmed.  Transition Care Management Follow-up Telephone Call Discharge Facility: [name]) Type of Discharge: Inpatient Admission Reason for ED Visit: Other: (ORIF right ankle) How have you been since you were released from the hospital?: Better Any questions or concerns?: No  Items Reviewed: Did you receive and understand the discharge instructions provided?: Yes Medications obtained,verified, and reconciled?: Yes (Medications Reviewed) Any new allergies since your discharge?: No Dietary orders reviewed?: Yes Type of Diet Ordered:: low sodium  heart healthy Do you have support at home?: Yes People in Home: child(ren), adult Name of Support/Comfort Primary Source: at present, pt lives with adult daughter  Medications Reviewed Today: Medications Reviewed Today     Reviewed by Audrie Gallus, RN (Registered Nurse) on 04/24/23 at 1302  Med List Status: <None>   Medication Order Taking? Sig Documenting Provider Last Dose Status Informant  acetaminophen (TYLENOL) 325 MG tablet [id number] No Take 650 mg by mouth every 6 (six) hours as needed for mild pain or headache. [provider] 04/17/2023 Active Self  amLODipine (NORVASC) 10 MG tablet [id number] No Take 1 tablet (10 mg total) by mouth daily. Ellender Hose, NP 04/18/2023 0600 Active Self           Med Note Wilkie Aye, JEANNETTA   Tue Apr 10, 2023  2:30 PM)    aspirin EC 81 MG tablet [id number]  Take 1 tablet (81 mg total) by mouth daily for 28 days. Swallow whole. Joen Laura, MD  Active   atorvastatin (LIPITOR) 40 MG tablet [id number] No TAKE 1 TABLET(40 MG) BY MOUTH DAILY [name], FNP 04/17/2023 Active Self  carvedilol (COREG) 6.25 MG tablet [id number] No Take 1 tablet (6.25 mg  total) by mouth 2 (two) times daily with a meal. Rai, Ripudeep K, MD 04/18/2023 0600 Active Self  FARXIGA 10 MG TABS tablet [id number] No Take 1 tablet (10 mg total) by mouth daily. Ellender Hose, NP Past Week Active Self  ferrous gluconate (FERGON) 324 MG tablet [id number] No Take 1 tablet (324 mg total) by mouth daily with breakfast. Rai, Delene Ruffini, MD Past Week Active Self  gabapentin (NEURONTIN) 100 MG capsule [id number] No Take 2 capsules (200 mg total) by mouth 2 (two) times daily.  Patient taking differently: Take 200 mg by mouth 2 (two) times daily as needed (Nerve pain).   [name], FNP Past Week Active Self           Med Note Clearance Coots, CATHERINE T   Tue Nov 21, 2022 11:33 AM)    hydrALAZINE (APRESOLINE) 50 MG tablet [id number] No Take 1 tablet (50 mg total) by mouth 3 (three) times daily. Cathren Harsh, MD 04/18/2023 0600 Active Self  hydrocortisone (ANUSOL-HC) 25 MG suppository [id number] No Place 1 suppository (25 mg total) rectally every 12 (twelve) hours. Rai, Delene Ruffini, MD Past Month Active Self  isosorbide mononitrate (IMDUR) 60 MG 24 hr tablet [id number] No Take 1 tablet (60 mg total) by mouth daily. Rai, Delene Ruffini, MD 04/18/2023 0600 Active Self  methocarbamol (ROBAXIN) 500 MG tablet [id number]  Take 1 tablet (500 mg total) by mouth every 8 (eight) hours as needed for up to 10 days for muscle spasms. Joen Laura, MD  Active   ondansetron [facility]) 4 MG tablet [id number] No Take 1 tablet (  4 mg total) by mouth every 8 (eight) hours as needed for nausea or vomiting. Rai, Delene Ruffini, MD Past Month Active Self  ondansetron (ZOFRAN) 4 MG tablet [id number]  Take 1 tablet (4 mg total) by mouth every 8 (eight) hours as needed for up to 14 days for nausea or vomiting. Joen Laura, MD  Active   oxyCODONE (ROXICODONE) 5 MG immediate release tablet [id number]  Take 1 tablet (5 mg total) by mouth every 4 (four) hours as needed for up to 7 days for severe pain or moderate pain.  Joen Laura, MD  Active   polyethylene glycol (MIRALAX / GLYCOLAX) 17 g packet [id number] No Take 17 g by mouth daily as needed for mild constipation or moderate constipation. [provider] More than a month Active Self  spironolactone (ALDACTONE) 25 MG tablet [id number] No Take 1 tablet (25 mg total) by mouth daily. [name], FNP 04/17/2023 Active Self            Home Care and Equipment/Supplies: Were Home Health Services Ordered?: No Any new equipment or medical supplies ordered?: No (pt reports she is in WC, NWB right leg)  Functional Questionnaire: Do you need assistance with bathing/showering or dressing?: Yes (pt is taking a bath at the sink) Do you need assistance with meal preparation?: Yes (daughter assists) Do you need assistance with eating?: No Do you have difficulty maintaining continence: No Do you need assistance with getting out of bed/getting out of a chair/moving?: Yes (uses walker/ WC) Do you have difficulty managing or taking your medications?: No  Follow up appointments reviewed: PCP Follow-up appointment confirmed?: No (pt states she sees PCP in January 2025 and plans to see specialist within the nex week) MD Provider Line Number:[phone] Given: No Specialist Hospital Follow-up appointment confirmed?: No Follow-Up Specialty Provider:: orthopedic, per discharge instruction pt is to be seen within 2 weeks of discharge on 04/19/23, pt states she has the number in front of her and will call now to make follow up appointment with orthopedist, pt states she is able to do this without assistance Reason Specialist Follow-Up Not Confirmed: Patient has Specialist Provider Number and will Call for Appointment Do you need transportation to your follow-up appointment?: No Do you understand care options if your condition(s) worsen?: Yes-patient verbalized understanding  SDOH Interventions Today    Flowsheet Row Most Recent Value  SDOH Interventions    Housing Interventions Intervention Not Indicated  Utilities Interventions Intervention Not Indicated       Irving Shows [facility], BSN [facility]/ Ambulatory Care Management [phone]

## 2023-04-28 ENCOUNTER — Other Ambulatory Visit: Payer: Self-pay

## 2023-05-01 DIAGNOSIS — S82841D Displaced bimalleolar fracture of right lower leg, subsequent encounter for closed fracture with routine healing: Secondary | ICD-10-CM | POA: Diagnosis not present

## 2023-05-04 ENCOUNTER — Other Ambulatory Visit: Payer: Self-pay | Admitting: Orthopaedic Surgery

## 2023-05-04 DIAGNOSIS — S82841A Displaced bimalleolar fracture of right lower leg, initial encounter for closed fracture: Secondary | ICD-10-CM | POA: Diagnosis not present

## 2023-05-04 NOTE — Progress Notes (Signed)
Sent message, via epic in basket, requesting orders in epic from Careers adviser.

## 2023-05-07 ENCOUNTER — Encounter (HOSPITAL_COMMUNITY): Payer: Self-pay | Admitting: Orthopaedic Surgery

## 2023-05-07 ENCOUNTER — Other Ambulatory Visit: Payer: Self-pay

## 2023-05-07 NOTE — Anesthesia Preprocedure Evaluation (Signed)
Anesthesia Evaluation  Patient identified by MRN, date of birth, ID band Patient awake    Reviewed: Allergy & Precautions, NPO status , Patient's Chart, lab work & pertinent test results, reviewed documented beta blocker date and time   Airway Mallampati: II  TM Distance: >3 FB Neck ROM: Full    Dental  (+) Poor Dentition, Missing, Dental Advisory Given,    Pulmonary Current Smoker and Patient abstained from smoking.   Pulmonary exam normal breath sounds clear to auscultation       Cardiovascular hypertension, Pt. on medications and Pt. on home beta blockers +CHF  Normal cardiovascular exam Rhythm:Regular Rate:Normal  Echo 2020 Normal LV size with severe LV hypertrophy. EF 60-65%. Moderate    LAE. Normal RV size and systolic function. No significant    valvular abnormalities. Cardiac amyloidosis is a consideration.     Neuro/Psych negative neurological ROS  negative psych ROS   GI/Hepatic Neg liver ROS,GERD  Controlled,,  Endo/Other  BMI 33  Renal/GU CRFRenal diseaseCr 2.27  negative genitourinary   Musculoskeletal negative musculoskeletal ROS (+)    Abdominal  (+) + obese  Peds  Hematology negative hematology ROS (+)   Anesthesia Other Findings   Reproductive/Obstetrics negative OB ROS                             Anesthesia Physical Anesthesia Plan  ASA: 3  Anesthesia Plan: General   Post-op Pain Management: Regional block* and Tylenol PO (pre-op)*   Induction: Intravenous  PONV Risk Score and Plan: 2 and Ondansetron and Dexamethasone  Airway Management Planned: Video Laryngoscope Planned and Oral ETT  Additional Equipment: None  Intra-op Plan:   Post-operative Plan: Extubation in OR  Informed Consent: I have reviewed the patients History and Physical, chart, labs and discussed the procedure including the risks, benefits and alternatives for the proposed anesthesia with  the patient or authorized representative who has indicated his/her understanding and acceptance.     Dental advisory given  Plan Discussed with: CRNA  Anesthesia Plan Comments: (Last airway: Ventilation: Oral airway inserted - appropriate to patient size and Mask ventilation without difficulty Laryngoscope Size: Glidescope, Mac and 3 Grade View: Grade II Tube type: Oral Tube size: 7.0 mm Number of attempts: 1 Airway Equipment and Method: Stylet, Oral airway and Video-laryngoscopy )       Anesthesia Quick Evaluation

## 2023-05-07 NOTE — Progress Notes (Signed)
For Anesthesia: PCP - [name], FNP  Cardiologist - Chilton Si, MD  Nephrologist: Dr. Thedore Mins   Chest x-ray - greater than 1 year in [facility] EKG - 04/16/23 in [facility] Stress Test - N/A ECHO - greater than 2 years in [facility] Cardiac Cath - N/A Pacemaker/ICD device last checked: N/A Pacemaker orders received: N/A Device Rep notified: N/A  Spinal Cord Stimulator: N/A  Sleep Study - N/A CPAP - N/A  Fasting Blood Sugar - N/A Checks Blood Sugar ___N/A__ times a day Date and result of last Hgb A1c-N/A  Last dose of GLP1 agonist- N/A GLP1 instructions: N/A  Last dose of SGLT-2 inhibitors- Marcelline Deist taking for kidneys not diabetes SGLT-2 instructions: hold day of surgery  Blood Thinner Instructions:  N/A Aspirin Instructions:  N/A Last Dose:  Activity level: Can perform activities of daily living without stopping and without chest pain and/or shortness of breath    Anesthesia review: recent surgery 04/11/23 had review at that time no further changes in medical history since 04/11/23.  Patient denies shortness of breath, fever, cough and chest pain during pre op phone call.   Patient verbalized understanding of instructions reviewed via telephone.

## 2023-05-07 NOTE — Progress Notes (Signed)
Second request for pre op orders sent message, via epic in basket, requesting orders in epic from Careers adviser.

## 2023-05-08 ENCOUNTER — Encounter (HOSPITAL_COMMUNITY): Payer: Self-pay | Admitting: Orthopaedic Surgery

## 2023-05-08 ENCOUNTER — Encounter (HOSPITAL_COMMUNITY)
Admission: RE | Disposition: A | Discharge status: Home / Self Care | Payer: Self-pay | Source: Home / Self Care | Attending: Orthopaedic Surgery

## 2023-05-08 ENCOUNTER — Ambulatory Visit (HOSPITAL_COMMUNITY): Payer: 59

## 2023-05-08 ENCOUNTER — Ambulatory Visit (HOSPITAL_COMMUNITY): Payer: 59 | Admitting: Anesthesiology

## 2023-05-08 ENCOUNTER — Ambulatory Visit (HOSPITAL_COMMUNITY)
Admission: RE | Admit: 2023-05-08 | Discharge: 2023-05-08 | Disposition: A | Discharge status: Home / Self Care | Payer: 59 | Attending: Orthopaedic Surgery | Admitting: Orthopaedic Surgery

## 2023-05-08 ENCOUNTER — Ambulatory Visit (HOSPITAL_BASED_OUTPATIENT_CLINIC_OR_DEPARTMENT_OTHER): Payer: 59 | Admitting: Anesthesiology

## 2023-05-08 ENCOUNTER — Other Ambulatory Visit: Payer: Self-pay

## 2023-05-08 DIAGNOSIS — S82851A Displaced trimalleolar fracture of right lower leg, initial encounter for closed fracture: Secondary | ICD-10-CM

## 2023-05-08 DIAGNOSIS — I130 Hypertensive heart and chronic kidney disease with heart failure and stage 1 through stage 4 chronic kidney disease, or unspecified chronic kidney disease: Secondary | ICD-10-CM | POA: Insufficient documentation

## 2023-05-08 DIAGNOSIS — Z4789 Encounter for other orthopedic aftercare: Secondary | ICD-10-CM | POA: Diagnosis not present

## 2023-05-08 DIAGNOSIS — Z841 Family history of disorders of kidney and ureter: Secondary | ICD-10-CM | POA: Insufficient documentation

## 2023-05-08 DIAGNOSIS — T84116A Breakdown (mechanical) of internal fixation device of bone of right lower leg, initial encounter: Secondary | ICD-10-CM | POA: Diagnosis not present

## 2023-05-08 DIAGNOSIS — Z833 Family history of diabetes mellitus: Secondary | ICD-10-CM | POA: Diagnosis not present

## 2023-05-08 DIAGNOSIS — E1122 Type 2 diabetes mellitus with diabetic chronic kidney disease: Secondary | ICD-10-CM | POA: Diagnosis not present

## 2023-05-08 DIAGNOSIS — F17210 Nicotine dependence, cigarettes, uncomplicated: Secondary | ICD-10-CM | POA: Diagnosis not present

## 2023-05-08 DIAGNOSIS — Z6833 Body mass index (BMI) 33.0-33.9, adult: Secondary | ICD-10-CM | POA: Diagnosis not present

## 2023-05-08 DIAGNOSIS — I5030 Unspecified diastolic (congestive) heart failure: Secondary | ICD-10-CM | POA: Diagnosis not present

## 2023-05-08 DIAGNOSIS — N183 Chronic kidney disease, stage 3 unspecified: Secondary | ICD-10-CM | POA: Diagnosis not present

## 2023-05-08 DIAGNOSIS — E669 Obesity, unspecified: Secondary | ICD-10-CM | POA: Diagnosis not present

## 2023-05-08 DIAGNOSIS — Z5986 Financial insecurity: Secondary | ICD-10-CM | POA: Diagnosis not present

## 2023-05-08 DIAGNOSIS — X58XXXA Exposure to other specified factors, initial encounter: Secondary | ICD-10-CM | POA: Insufficient documentation

## 2023-05-08 DIAGNOSIS — I152 Hypertension secondary to endocrine disorders: Secondary | ICD-10-CM

## 2023-05-08 DIAGNOSIS — Z7984 Long term (current) use of oral hypoglycemic drugs: Secondary | ICD-10-CM | POA: Insufficient documentation

## 2023-05-08 DIAGNOSIS — N189 Chronic kidney disease, unspecified: Secondary | ICD-10-CM | POA: Diagnosis not present

## 2023-05-08 DIAGNOSIS — I5032 Chronic diastolic (congestive) heart failure: Secondary | ICD-10-CM | POA: Insufficient documentation

## 2023-05-08 DIAGNOSIS — S93431A Sprain of tibiofibular ligament of right ankle, initial encounter: Secondary | ICD-10-CM | POA: Diagnosis not present

## 2023-05-08 DIAGNOSIS — K219 Gastro-esophageal reflux disease without esophagitis: Secondary | ICD-10-CM | POA: Diagnosis not present

## 2023-05-08 DIAGNOSIS — D509 Iron deficiency anemia, unspecified: Secondary | ICD-10-CM

## 2023-05-08 DIAGNOSIS — G8918 Other acute postprocedural pain: Secondary | ICD-10-CM | POA: Diagnosis not present

## 2023-05-08 DIAGNOSIS — Z8249 Family history of ischemic heart disease and other diseases of the circulatory system: Secondary | ICD-10-CM | POA: Diagnosis not present

## 2023-05-08 DIAGNOSIS — I160 Hypertensive urgency: Secondary | ICD-10-CM

## 2023-05-08 HISTORY — PX: HARDWARE REMOVAL: SHX979

## 2023-05-08 HISTORY — PX: ORIF ANKLE FRACTURE: SHX5408

## 2023-05-08 HISTORY — PX: SYNDESMOSIS REPAIR: SHX5182

## 2023-05-08 LAB — CBC
HCT: 35.5 % — ABNORMAL LOW (ref 36.0–46.0)
Hemoglobin: 9.3 g/dL — ABNORMAL LOW (ref 12.0–15.0)
MCH: 25.8 pg — ABNORMAL LOW (ref 26.0–34.0)
MCHC: 26.2 g/dL — ABNORMAL LOW (ref 30.0–36.0)
MCV: 98.6 fL (ref 80.0–100.0)
Platelets: 348 10*3/uL (ref 150–400)
RBC: 3.6 MIL/uL — ABNORMAL LOW (ref 3.87–5.11)
RDW: 17.4 % — ABNORMAL HIGH (ref 11.5–15.5)
WBC: 5.3 10*3/uL (ref 4.0–10.5)
nRBC: 0 % (ref 0.0–0.2)

## 2023-05-08 LAB — BASIC METABOLIC PANEL
Anion gap: 12 (ref 5–15)
BUN: 39 mg/dL — ABNORMAL HIGH (ref 8–23)
CO2: 21 mmol/L — ABNORMAL LOW (ref 22–32)
Calcium: 8.5 mg/dL — ABNORMAL LOW (ref 8.9–10.3)
Chloride: 105 mmol/L (ref 98–111)
Creatinine, Ser: 2.09 mg/dL — ABNORMAL HIGH (ref 0.44–1.00)
GFR, Estimated: 26 mL/min — ABNORMAL LOW (ref >60)
Glucose, Bld: 76 mg/dL (ref 70–99)
Potassium: 4.5 mmol/L (ref 3.5–5.1)
Sodium: 138 mmol/L (ref 135–145)

## 2023-05-08 SURGERY — OPEN REDUCTION INTERNAL FIXATION (ORIF) ANKLE FRACTURE
Anesthesia: General | Site: Ankle | Laterality: Right

## 2023-05-08 MED ORDER — DEXAMETHASONE SODIUM PHOSPHATE 10 MG/ML IJ SOLN
INTRAMUSCULAR | Status: AC
  Filled 2023-05-08: qty 1

## 2023-05-08 MED ORDER — CEFAZOLIN SODIUM-DEXTROSE 2-4 GM/100ML-% IV SOLN
INTRAVENOUS | Status: AC
  Filled 2023-05-08: qty 100

## 2023-05-08 MED ORDER — OXYCODONE HCL 5 MG PO TABS: 5.0000 mg | ORAL_TABLET | Freq: Once | ORAL | Status: DC | PRN

## 2023-05-08 MED ORDER — VANCOMYCIN HCL 1000 MG IV SOLR
INTRAVENOUS | Status: AC
  Filled 2023-05-08: qty 20

## 2023-05-08 MED ORDER — HYDROMORPHONE HCL 1 MG/ML IJ SOLN: 0.2500 mg | INTRAMUSCULAR | Status: DC | PRN

## 2023-05-08 MED ORDER — CEFAZOLIN SODIUM-DEXTROSE 2-4 GM/100ML-% IV SOLN
2.0000 g | INTRAVENOUS | Status: AC
  Administered 2023-05-08: 2 g via INTRAVENOUS

## 2023-05-08 MED ORDER — MIDAZOLAM HCL 2 MG/2ML IJ SOLN
1.0000 mg | Freq: Once | INTRAMUSCULAR | Status: AC
  Administered 2023-05-08: 2 mg via INTRAVENOUS
  Filled 2023-05-08: qty 2

## 2023-05-08 MED ORDER — FENTANYL CITRATE (PF) 100 MCG/2ML IJ SOLN
INTRAMUSCULAR | Status: AC
  Filled 2023-05-08: qty 2

## 2023-05-08 MED ORDER — 0.9 % SODIUM CHLORIDE (POUR BTL) OPTIME
TOPICAL | Status: DC | PRN
  Administered 2023-05-08: 1000 mL

## 2023-05-08 MED ORDER — ONDANSETRON HCL 4 MG/2ML IJ SOLN
INTRAMUSCULAR | Status: AC
  Filled 2023-05-08: qty 2

## 2023-05-08 MED ORDER — PROPOFOL 10 MG/ML IV BOLUS
INTRAVENOUS | Status: DC | PRN
  Administered 2023-05-08: 200 mg via INTRAVENOUS

## 2023-05-08 MED ORDER — HYDRALAZINE HCL 20 MG/ML IJ SOLN
INTRAMUSCULAR | Status: AC
  Administered 2023-05-08: 5 mg via INTRAVENOUS
  Filled 2023-05-08: qty 1

## 2023-05-08 MED ORDER — ONDANSETRON HCL 4 MG/2ML IJ SOLN: 4.0000 mg | Freq: Once | INTRAMUSCULAR | Status: DC | PRN

## 2023-05-08 MED ORDER — CHLORHEXIDINE GLUCONATE 0.12 % MT SOLN
15.0000 mL | Freq: Once | OROMUCOSAL | Status: AC
  Administered 2023-05-08: 15 mL via OROMUCOSAL

## 2023-05-08 MED ORDER — LACTATED RINGERS IV SOLN: INTRAVENOUS | Status: DC

## 2023-05-08 MED ORDER — ASPIRIN 81 MG PO TBEC: 81.0000 mg | DELAYED_RELEASE_TABLET | Freq: Two times a day (BID) | ORAL | 0 refills | Status: DC

## 2023-05-08 MED ORDER — FENTANYL CITRATE PF 50 MCG/ML IJ SOSY
50.0000 ug | PREFILLED_SYRINGE | Freq: Once | INTRAMUSCULAR | Status: AC
  Administered 2023-05-08: 100 ug via INTRAVENOUS
  Filled 2023-05-08: qty 2

## 2023-05-08 MED ORDER — SUGAMMADEX SODIUM 200 MG/2ML IV SOLN
INTRAVENOUS | Status: DC | PRN
  Administered 2023-05-08: 200 mg via INTRAVENOUS

## 2023-05-08 MED ORDER — ORAL CARE MOUTH RINSE: 15.0000 mL | Freq: Once | OROMUCOSAL | Status: AC

## 2023-05-08 MED ORDER — FENTANYL CITRATE (PF) 100 MCG/2ML IJ SOLN
INTRAMUSCULAR | Status: DC | PRN
  Administered 2023-05-08 (×3): 50 ug via INTRAVENOUS

## 2023-05-08 MED ORDER — OXYCODONE HCL 5 MG PO TABS
5.0000 mg | ORAL_TABLET | ORAL | 0 refills | Status: AC | PRN
Start: 2023-05-08 — End: ?

## 2023-05-08 MED ORDER — HYDRALAZINE HCL 20 MG/ML IJ SOLN
5.0000 mg | INTRAMUSCULAR | Status: DC | PRN
  Administered 2023-05-08 (×2): 5 mg via INTRAVENOUS

## 2023-05-08 MED ORDER — ACETAMINOPHEN 500 MG PO TABS
1000.0000 mg | ORAL_TABLET | Freq: Once | ORAL | Status: AC
  Administered 2023-05-08: 1000 mg via ORAL
  Filled 2023-05-08: qty 2

## 2023-05-08 MED ORDER — ROPIVACAINE HCL 5 MG/ML IJ SOLN
INTRAMUSCULAR | Status: DC | PRN
Start: 2023-05-08 — End: 2023-05-08
  Administered 2023-05-08: 40 mL via PERINEURAL

## 2023-05-08 MED ORDER — DEXAMETHASONE SODIUM PHOSPHATE 10 MG/ML IJ SOLN
INTRAMUSCULAR | Status: DC | PRN
Start: 2023-05-08 — End: 2023-05-08
  Administered 2023-05-08: 10 mg

## 2023-05-08 MED ORDER — EPHEDRINE SULFATE (PRESSORS) 50 MG/ML IJ SOLN
INTRAMUSCULAR | Status: DC | PRN
  Administered 2023-05-08: 2.5 mg via INTRAVENOUS

## 2023-05-08 MED ORDER — PROPOFOL 10 MG/ML IV BOLUS
INTRAVENOUS | Status: AC
  Filled 2023-05-08: qty 20

## 2023-05-08 MED ORDER — LIDOCAINE HCL (CARDIAC) PF 100 MG/5ML IV SOSY
PREFILLED_SYRINGE | INTRAVENOUS | Status: DC | PRN
  Administered 2023-05-08: 60 mg via INTRAVENOUS

## 2023-05-08 MED ORDER — OXYCODONE HCL 5 MG/5ML PO SOLN: 5.0000 mg | Freq: Once | ORAL | Status: DC | PRN

## 2023-05-08 MED ORDER — ROCURONIUM BROMIDE 100 MG/10ML IV SOLN
INTRAVENOUS | Status: DC | PRN
  Administered 2023-05-08: 70 mg via INTRAVENOUS

## 2023-05-08 MED ORDER — DEXAMETHASONE SODIUM PHOSPHATE 10 MG/ML IJ SOLN
INTRAMUSCULAR | Status: DC | PRN
  Administered 2023-05-08: 8 mg via INTRAVENOUS

## 2023-05-08 MED ORDER — MIDAZOLAM HCL 2 MG/2ML IJ SOLN
INTRAMUSCULAR | Status: AC
  Filled 2023-05-08: qty 2

## 2023-05-08 MED ORDER — ONDANSETRON HCL 4 MG/2ML IJ SOLN
INTRAMUSCULAR | Status: DC | PRN
  Administered 2023-05-08: 4 mg via INTRAVENOUS

## 2023-05-08 SURGICAL SUPPLY — 80 items
APL PRP STRL LF DISP 70% ISPRP (MISCELLANEOUS) ×2
APL SKNCLS STERI-STRIP NONHPOA (GAUZE/BANDAGES/DRESSINGS)
BAG COUNTER SPONGE SURGICOUNT (BAG) IMPLANT
BAG SPNG CNTER NS LX DISP (BAG)
BANDAGE ESMARK 6X9 LF (GAUZE/BANDAGES/DRESSINGS) IMPLANT
BENZOIN TINCTURE PRP APPL 2/3 (GAUZE/BANDAGES/DRESSINGS) IMPLANT
BIT DRILL 2.5 CANN STRL (BIT) IMPLANT
BIT DRILL 2.6 CANN (BIT) IMPLANT
BLADE SURG 15 STRL LF DISP TIS (BLADE) ×6 IMPLANT
BLADE SURG 15 STRL SS (BLADE) ×4
BNDG CMPR 5X4 CHSV STRCH STRL (GAUZE/BANDAGES/DRESSINGS)
BNDG CMPR 5X4 KNIT ELC UNQ LF (GAUZE/BANDAGES/DRESSINGS)
BNDG CMPR 6 X 5 YARDS HK CLSR (GAUZE/BANDAGES/DRESSINGS) ×4
BNDG CMPR 9X4 STRL LF SNTH (GAUZE/BANDAGES/DRESSINGS)
BNDG CMPR 9X6 STRL LF SNTH (GAUZE/BANDAGES/DRESSINGS)
BNDG COHESIVE 4X5 TAN STRL LF (GAUZE/BANDAGES/DRESSINGS) IMPLANT
BNDG ELASTIC 4INX 5YD STR LF (GAUZE/BANDAGES/DRESSINGS) IMPLANT
BNDG ELASTIC 6INX 5YD STR LF (GAUZE/BANDAGES/DRESSINGS) ×6 IMPLANT
BNDG ESMARK 4X9 LF (GAUZE/BANDAGES/DRESSINGS) IMPLANT
BNDG ESMARK 6X9 LF (GAUZE/BANDAGES/DRESSINGS)
CHLORAPREP W/TINT 26 (MISCELLANEOUS) ×3 IMPLANT
COVER BACK TABLE 60X90IN (DRAPES) ×3 IMPLANT
COVER MAYO STAND STRL (DRAPES) IMPLANT
CUFF TOURN SGL QUICK 34 (TOURNIQUET CUFF) ×2
CUFF TRNQT CYL 34X4.125X (TOURNIQUET CUFF) ×3 IMPLANT
DRAPE C-ARM 42X120 X-RAY (DRAPES) IMPLANT
DRAPE C-ARMOR (DRAPES) IMPLANT
DRAPE EXTREMITY T 121X128X90 (DISPOSABLE) ×3 IMPLANT
DRAPE IMP U-DRAPE 54X76 (DRAPES) ×3 IMPLANT
DRAPE OEC MINIVIEW 54X84 (DRAPES) ×3 IMPLANT
DRAPE U-SHAPE 47X51 STRL (DRAPES) ×3 IMPLANT
ELECT REM PT RETURN 15FT ADLT (MISCELLANEOUS) ×3 IMPLANT
GAUZE PAD ABD 8X10 STRL (GAUZE/BANDAGES/DRESSINGS) IMPLANT
GAUZE SPONGE 4X4 12PLY STRL (GAUZE/BANDAGES/DRESSINGS) ×3 IMPLANT
GAUZE XEROFORM 1X8 LF (GAUZE/BANDAGES/DRESSINGS) ×3 IMPLANT
GLOVE BIOGEL PI IND STRL 8 (GLOVE) ×3 IMPLANT
GLOVE SURG LX STRL 7.5 STRW (GLOVE) ×3 IMPLANT
GOWN STRL REUS W/ TWL LRG LVL3 (GOWN DISPOSABLE) ×3 IMPLANT
GOWN STRL REUS W/ TWL XL LVL3 (GOWN DISPOSABLE) ×3 IMPLANT
GOWN STRL REUS W/TWL LRG LVL3 (GOWN DISPOSABLE) ×2
GOWN STRL REUS W/TWL XL LVL3 (GOWN DISPOSABLE) ×2
GUIDEWIRE 1.35MM (WIRE) IMPLANT
KIT BASIN OR (CUSTOM PROCEDURE TRAY) ×3 IMPLANT
KIT TURNOVER KIT A (KITS) IMPLANT
NDL HYPO 25X1 1.5 SAFETY (NEEDLE) IMPLANT
NEEDLE HYPO 25X1 1.5 SAFETY (NEEDLE)
NS IRRIG 1000ML POUR BTL (IV SOLUTION) ×3 IMPLANT
PACK ORTHO EXTREMITY (CUSTOM PROCEDURE TRAY) ×3 IMPLANT
PAD CAST 4YDX4 CTTN HI CHSV (CAST SUPPLIES) ×3 IMPLANT
PADDING CAST COTTON 4X4 STRL (CAST SUPPLIES) ×2
PADDING CAST SYNTHETIC 4X4 STR (CAST SUPPLIES) ×6 IMPLANT
PENCIL SMOKE EVACUATOR (MISCELLANEOUS) ×3 IMPLANT
PLATE LOCKING MEDIAL HOOK 5H (Plate) IMPLANT
SCOTCHCAST PLUS 4X4 WHITE (CAST SUPPLIES) IMPLANT
SCREW CORT LP 3.5X60 (Screw) IMPLANT
SCREW LO PRF TMSS 3.5X55 CORT (Screw) IMPLANT
SCREW LOW PROFILE 3.5X35 (Screw) IMPLANT
SHEET MEDIUM DRAPE 40X70 STRL (DRAPES) ×3 IMPLANT
SLEEVE SCD COMPRESS KNEE MED (STOCKING) ×3 IMPLANT
SPIKE FLUID TRANSFER (MISCELLANEOUS) IMPLANT
SPLINT PLASTER CAST XFAST 5X30 (CAST SUPPLIES) ×60 IMPLANT
SPONGE T-LAP 18X18 ~~LOC~~+RFID (SPONGE) IMPLANT
STAPLER VISISTAT 35W (STAPLE) IMPLANT
STOCKINETTE 6 STRL (DRAPES) ×3 IMPLANT
STRIP CLOSURE SKIN 1/2X4 (GAUZE/BANDAGES/DRESSINGS) IMPLANT
SUCTION TUBE FRAZIER 10FR DISP (SUCTIONS) ×3 IMPLANT
SUT ETHILON 2 0 PS N (SUTURE) IMPLANT
SUT ETHILON 3 0 PS 1 (SUTURE) ×3 IMPLANT
SUT MNCRL AB 3-0 PS2 18 (SUTURE) ×3 IMPLANT
SUT PDS AB 2-0 CT2 27 (SUTURE) ×3 IMPLANT
SUT VIC AB 2-0 SH 27 (SUTURE) ×6
SUT VIC AB 2-0 SH 27XBRD (SUTURE) IMPLANT
SUT VIC AB 3-0 FS2 27 (SUTURE) ×3 IMPLANT
SYR BULB EAR ULCER 3OZ GRN STR (SYRINGE) ×3 IMPLANT
SYR CONTROL 10ML LL (SYRINGE) IMPLANT
TOWEL GREEN STERILE FF (TOWEL DISPOSABLE) ×6 IMPLANT
TOWEL OR 17X26 10 PK STRL BLUE (TOWEL DISPOSABLE) ×3 IMPLANT
TUBING CONNECTING 10 (TUBING) IMPLANT
UNDERPAD 30X36 HEAVY ABSORB (UNDERPADS AND DIAPERS) ×3 IMPLANT
YANKAUER SUCT BULB TIP 10FT TU (MISCELLANEOUS) ×3 IMPLANT

## 2023-05-08 NOTE — Anesthesia Procedure Notes (Signed)
Anesthesia Regional Block: Adductor canal block   Pre-Anesthetic Checklist: , timeout performed,  Correct Patient, Correct Site, Correct Laterality,  Correct Procedure, Correct Position, site marked,  Risks and benefits discussed,  Surgical consent,  Pre-op evaluation,  At surgeon's request and post-op pain management  Laterality: Right  Prep: Maximum Sterile Barrier Precautions used, chloraprep       Needles:  Injection technique: Single-shot  Needle Type: Echogenic Stimulator Needle     Needle Length: 9cm  Needle Gauge: 22     Additional Needles:   Procedures:,,,, ultrasound used (permanent image in chart),,    Narrative:  Start time: 05/08/2023 8:30 AM End time: 05/08/2023 8:35 AM Injection made incrementally with aspirations every 5 mL.  Performed by: Personally  Anesthesiologist: Lannie Fields, DO  Additional Notes: Monitors applied. No increased pain on injection. No increased resistance to injection. Injection made in 5cc increments. Good needle visualization. Patient tolerated procedure well.

## 2023-05-08 NOTE — Progress Notes (Signed)
DR. Susa Simmonds aware that H/P needs to be corrected from left ankle fracture to right ankle fracture .

## 2023-05-08 NOTE — Discharge Instructions (Signed)
DR. Lucia Gaskins FOOT [facility] ANKLE SURGERY POST-OP INSTRUCTIONS   Pain Management The numbing medicine and your leg will last around 18 hours, take a dose of your pain medicine as soon as you feel it wearing off to avoid rebound pain. Keep your foot elevated above heart level.  Make sure that your heel hangs free ('floats'). Take all prescribed medication as directed. If taking narcotic pain medication you may want to use an over-the-counter stool softener to avoid constipation. You may take over-the-counter NSAIDs (ibuprofen, naproxen, etc.) as well as over-the-counter acetaminophen as directed on the packaging as a supplement for your pain and may also use it to wean away from the prescription medication.  Activity Non-weightbearing Keep splint intact  First Postoperative Visit Your first postop visit will be at least 2 weeks after surgery.  This should be scheduled when you schedule surgery. If you do not have a postoperative visit scheduled please call [phone] to schedule an appointment. At the appointment your incision will be evaluated for suture removal, x-rays will be obtained if necessary.  General Instructions Swelling is very common after foot and ankle surgery.  It often takes 3 months for the foot and ankle to begin to feel comfortable.  Some amount of swelling will persist for 6-12 months. DO NOT change the dressing.  If there is a problem with the dressing (too tight, loose, gets wet, etc.) please contact Dr. Pollie Friar office. DO NOT get the dressing wet.  For showers you can use an over-the-counter cast cover or wrap a washcloth around the top of your dressing and then cover it with a plastic bag and tape it to your leg. DO NOT soak the incision (no tubs, pools, bath, etc.) until you have approval from Dr. Lucia Gaskins.  Contact Dr. Huel Cote office or go to Emergency Room if: Temperature above 101 F. Increasing pain that is unresponsive to pain medication or elevation Excessive redness or  swelling in your foot Dressing problems - excessive bloody drainage, looseness or tightness, or if dressing gets wet Develop pain, swelling, warmth, or discoloration of your calf

## 2023-05-08 NOTE — Op Note (Signed)
[name] female 62 y.o. 05/08/2023  PreOperative Diagnosis: Right trimalleolar ankle fracture Right syndesmosis disruption Failure of implanted orthopedic hardware, right ankle lateral and medial  PostOperative Diagnosis: Same  PROCEDURE: Revision open treatment of right trimalleolar ankle fracture Revision open treatment of syndesmosis Deep orthopedic hardware removal, medial ankle Deep orthopedic hardware removal, lateral ankle  SURGEON: Dub Mikes, MD  ASSISTANT: Jesse [place], PA-C was necessary for patient positioning, prep, drape, assistance with hardware removal and treatment of fracture  ANESTHESIA: General with peripheral nerve block  FINDINGS: Patient had had failure of previous ankle fracture fixation medially and laterally.  She had subluxation of the tibiotalar joint with displacement of her fracture and widening of the syndesmosis.  IMPLANTS: Arthrex fully threaded screws, 4.0 mm cannulated screws and medial foot plate  [id number]:62 y.o. femalesustained a trimalleolar ankle fracture with syndesmosis disruption previously that underwent open treatment.  She was unable to maintain nonweightbearing and was noted to have failure of her implants with repeat displacement of her fracture and tibiotalar joint on initial postoperative visit.  She was sent to me due to the complexity of her issue and fractures.  She was indicated for revision treatment of her fractures and syndesmosis as well as hardware removal.   Patient understood the risks, benefits and alternatives to surgery which include but are not limited to wound healing complications, infection, nonunion, malunion, need for further surgery as well as damage to surrounding structures. They also understood the potential for continued pain in that there were no guarantees of acceptable outcome After weighing these risks the patient opted to proceed with surgery.  PROCEDURE: Patient was identified in the  preoperative holding area.  The right leg was marked by myself.  Consent was signed by myself and the patient.  Block was performed by anesthesia in the preoperative holding area.  Patient was taken to the operative suite and placed supine on the operative table.  General endotracheal tube anesthesia was induced without difficulty. Bump was placed under the operative hip and bone foam was used.  All bony prominences were well padded.  Tourniquet was placed on the operative thigh.  Preoperative antibiotics were given. The extremity was prepped and draped in the usual sterile fashion and surgical timeout was performed.  The limb was elevated and the tourniquet was inflated to 250 mmHg.  We began by removing the previously placed sutures.  The incision was made through the previously made incision on the medial aspect of the ankle.  This was carried sharply through skin and subcutaneous tissue.  The incision was carried more proximal than previously made to gain full access to the medial aspect of the medial malleolus and fractures.  We are then able to mobilize skin flaps.  There is a significant amount of fibrous hematoma and acute and chronic fracturing of the bone.    Deep orthopedic hardware removal, medial ankle After skin flaps were fully mobilized we were able to identify the previously placed hardware.  Using a screwdriver the screws were removed uneventfully.  Then we inspected the fracture and it was noted that there was significant comminution and displacement of her medial malleolus fracture as well as interposed fibrous and consolidated hematoma tissue within the medial aspect of the ankle joint.  Fredrik Cove was used to carefully remove all of this acute and subacute material.  We then turned our attention to the lateral ankle.  Incision was made overlying the lateral malleolus and fibula.  It was carried sharply through skin and subcutaneous  tissue.  There is a significant amount of acute and subacute  fibrous tissue and consolidated hematoma around the area of the fracture.  The previously placed hardware was identified.  Flaps were created anteriorly and posteriorly.  The fibula was fully exposed.  There was some displacement of fracture fragments along the anterior and medial aspect of the fibula that was mobilized.  Care was taken to remove interposed fibrous tissue and consolidated hematoma-type tissue from the fracture site.  We then turned her attention distally to the syndesmosis.  There was disruption of the syndesmosis and care was taken to fully open the syndesmosis and remove any interposed consolidated hematoma tissue from the lateral ankle and within the syndesmotic area.  Deep orthopedic hardware removal, lateral ankle Using a kocher the hardware laterally was removed.  There was tight rope button fixation as well as tight rope material within the tibia and fibula that was removed with a ronguer.  The hardware was discarded.  Complexity Mobilizing the fracture took significantly longer than It would have had we been in an acute phase and the chronicity and revision nature of this fracture mobilization and fixation added significant complexity and necessary time to the case.  Sharp dissection was required within the area of the fracture to remove the subacute consolidated hematoma tissue and mobilize the fractures.  Revision open treatment of trimalleolar ankle fracture Then proceeded to reduce the fractures and stabilize them.  Using a reduction forceps the lateral malleolus fractures were reduced any more in anatomic position.  Then we turned our attention to the medial malleolus.  There was significant comminution and subacute nature of the fracture made anatomic reduction difficult.  We were able to line up the joint surfaces well and hold the fracture provisionally with pointed reduction forceps and K wire fixation.  Open treatment of syndesmosis We then turned our attention to the  syndesmosis.  Weber clamp was used to clamp through the syndesmosis.  We were able to directly view the syndesmosis through the incisions and surgical dissection.  The syndesmosis was reduced and held rigidly with Weber clamp.  We then proceeded to place hardware.  Along the medial aspect of the medial malleolus a hook plate was placed with a combination of locking and nonlocking screws to stabilize the medial malleolus fracture.  A homerun screw was placed through the plate.  This was a fully threaded screw to maintain length of the fracture.  We then turned attention to the lateral ankle.  The fibula was stabilized internally and the syndesmosis was stabilized using fully threaded cortical screws.  Then fluoroscopy was used to confirm appropriate position of the hardware, maintenance of reduction of the fracture fragments and tibiotalar joint.  The posterior malleolus fracture was acceptably reduced and not amenable to internal fixation.  Final fluoroscopic images were obtained.  The wounds were irrigated with normal saline.  The wounds were closed in a layered fashion using2-0 Vicryl, 3-0 Monocryl and 2-0 nylon suture.  Patient was placed in a short leg cast.  She was awakened from anesthesia and taken recovery in stable condition.  No complications.   POST OPERATIVE INSTRUCTIONS: Nonweightbearing to operative extremity Keep cast dry and intact Follow-up in 2 weeks for cast removal, nonweightbearing x-rays.  May leave the sutures in place  TOURNIQUET TIME:less than 2 hours  BLOOD LOSS:  Minimal         DRAINS: none         SPECIMEN: none  COMPLICATIONS:  (1) Difficult to intubate - expected  Comments: Filed from anesthesia note documentation.         Disposition: PACU - hemodynamically stable.         Condition: stable

## 2023-05-08 NOTE — Anesthesia Postprocedure Evaluation (Signed)
Anesthesia Post Note  Patient: [name]  Procedure(s) Performed: REVISION OPEN REDUCTION INTERNAL FIXATION (ORIF) OF RIGHT TRIMALLEOLAR ANKLE FRACTURE, OPEN TREATMENT OF SYNDESMOSIS (Right: Ankle) SYNDESMOSIS REPAIR (Right: Ankle) HARDWARE REMOVAL MEDIAL AND LATERAL (Right)     Patient location during evaluation: PACU Anesthesia Type: General and Regional Level of consciousness: awake and alert, oriented and patient cooperative Pain management: pain level controlled Vital Signs Assessment: post-procedure vital signs reviewed and stable Respiratory status: spontaneous breathing, nonlabored ventilation and respiratory function stable Cardiovascular status: blood pressure returned to baseline and stable Postop Assessment: no apparent nausea or vomiting Anesthetic complications: no     Last Vitals:  Vitals:   05/08/23 1302 05/08/23 1315  BP: (!) 176/100 (!) 174/88  Pulse: 68 63  Resp: (!) 21 17  Temp:    SpO2: 95% 97%    Last Pain:  Vitals:   05/08/23 1230  TempSrc:   PainSc: Asleep                 Lannie Fields

## 2023-05-08 NOTE — Transfer of Care (Signed)
Immediate Anesthesia Transfer of Care Note  Patient: [name]  Procedure(s) Performed: REVISION OPEN REDUCTION INTERNAL FIXATION (ORIF) OF RIGHT TRIMALLEOLAR ANKLE FRACTURE, OPEN TREATMENT OF SYNDESMOSIS (Right: Ankle) SYNDESMOSIS REPAIR (Right: Ankle) HARDWARE REMOVAL MEDIAL AND LATERAL (Right)  Patient Location: PACU  Anesthesia Type:General  Level of Consciousness: awake, alert, oriented  Airway & Oxygen Therapy: Patient Spontanous Breathing and Patient connected to face mask oxygen  Post-op Assessment: Report given to RN and Post -op Vital signs reviewed and stable  Post vital signs: Reviewed and stable  Last Vitals:  Vitals Value Taken Time  BP 188/100 05/08/23 1152  Temp    Pulse 72 05/08/23 1154  Resp 10 05/08/23 1154  SpO2 100 % 05/08/23 1154  Vitals shown include unfiled device data.  Last Pain:  Vitals:   05/08/23 0840  TempSrc:   PainSc: 0-No pain         Complications:  Encounter Notable Events  Notable Event Outcome Phase Comment  Difficult to intubate - expected  Intraprocedure Filed from anesthesia note documentation.

## 2023-05-08 NOTE — Anesthesia Procedure Notes (Signed)
Anesthesia Regional Block: Popliteal block   Pre-Anesthetic Checklist: , timeout performed,  Correct Patient, Correct Site, Correct Laterality,  Correct Procedure, Correct Position, site marked,  Risks and benefits discussed,  Surgical consent,  Pre-op evaluation,  At surgeon's request and post-op pain management  Laterality: Right  Prep: Maximum Sterile Barrier Precautions used, chloraprep       Needles:  Injection technique: Single-shot  Needle Type: Echogenic Stimulator Needle     Needle Length: 9cm  Needle Gauge: 22     Additional Needles:   Procedures:,,,, ultrasound used (permanent image in chart),,    Narrative:  Start time: 05/08/2023 8:25 AM End time: 05/08/2023 8:30 AM Injection made incrementally with aspirations every 5 mL.  Performed by: Personally  Anesthesiologist: Lannie Fields, DO  Additional Notes: Monitors applied. No increased pain on injection. No increased resistance to injection. Injection made in 5cc increments. Good needle visualization. Patient tolerated procedure well.

## 2023-05-08 NOTE — Anesthesia Procedure Notes (Addendum)
Procedure Name: Intubation Date/Time: 05/08/2023 9:47 AM  Performed by: Karoline Caldwell, CRNAPre-anesthesia Checklist: Patient identified, Patient being monitored, Timeout performed, Emergency Drugs available and Suction available Patient Re-evaluated:Patient Re-evaluated prior to induction Oxygen Delivery Method: Circle system utilized Preoxygenation: Pre-oxygenation with 100% oxygen Induction Type: IV induction Ventilation: Mask ventilation without difficulty and Oral airway inserted - appropriate to patient size Laryngoscope Size: 3 and Glidescope Grade View: Grade I Tube type: Oral Tube size: 7.0 mm Number of attempts: 1 Airway Equipment and Method: Video-laryngoscopy Placement Confirmation: ETT inserted through vocal cords under direct vision, positive ETCO2 and breath sounds checked- equal and bilateral Secured at: 21 cm Tube secured with: Tape Dental Injury: Teeth and Oropharynx as per pre-operative assessment

## 2023-05-08 NOTE — H&P (Addendum)
PREOPERATIVE H&P  Chief Complaint: Right ankle pain  HPI: [name] is a 62 y.o. female who presents for preoperative history and physical with a diagnosis of failure of hardware and right trimalleolar ankle fracture and syndesmosis.  She underwent open treatment nearly 3 weeks ago and unfortunately was unable to maintain nonweightbearing and therefore had failure of her medial hardware and syndesmotic hardware.  She had widening of her syndesmosis and pulling out of the screws on the medial side.  She was sent to me due to the complexity of her injury.  She is here today for revision surgery.. Symptoms are rated as moderate to severe, and have been worsening.  This is significantly impairing activities of daily living.  She has elected for surgical management.   Past Medical History:  Diagnosis Date   Adrenal adenoma 01/11/2021   Anemia    CHF (congestive heart failure) (HCC)    Chronic bronchitis (HCC)    "probably once/yr" (05/12/2013)   Chronic kidney disease    Diastolic heart failure    Dyslipidemia    pt denies this hx on 05/12/2013   Exertional shortness of breath    GERD (gastroesophageal reflux disease)    Heart murmur    Hypertension    Hypokalemia    Left ventricular hypertrophy 01/11/2021   Venous stasis ulcers (HCC)    Past Surgical History:  Procedure Laterality Date   BIOPSY  02/23/2023   Procedure: BIOPSY;  Surgeon: Lemar Lofty., MD;  Location: [facility] ENDOSCOPY;  Service: Gastroenterology;;   COLONOSCOPY N/A 02/23/2023   Procedure: COLONOSCOPY;  Surgeon: Lemar Lofty., MD;  Location: [facility] ENDOSCOPY;  Service: Gastroenterology;  Laterality: N/A;   ESOPHAGOGASTRODUODENOSCOPY N/A 02/23/2023   Procedure: ESOPHAGOGASTRODUODENOSCOPY (EGD);  Surgeon: Lemar Lofty., MD;  Location: [facility] ENDOSCOPY;  Service: Gastroenterology;  Laterality: N/A;   HOT HEMOSTASIS N/A 02/23/2023   Procedure: HOT HEMOSTASIS (ARGON PLASMA COAGULATION/BICAP);  Surgeon:  Lemar Lofty., MD;  Location: [facility] ENDOSCOPY;  Service: Gastroenterology;  Laterality: N/A;   IR US GUIDE VASC ACCESS RIGHT  02/27/2017   IR VENOGRAM ADRENAL BI  02/27/2017   IR VENOGRAM HEPATIC WO HEMODYNAMIC EVALUATION  02/27/2017   IR VENOGRAM RENAL BI  02/27/2017   IR VENOUS SAMPLING  02/27/2017   IR VENOUS SAMPLING  02/27/2017   ORIF ANKLE FRACTURE Right 04/18/2023   Procedure: OPEN REDUCTION INTERNAL FIXATION (ORIF) ANKLE FRACTURE;  Surgeon: Joen Laura, MD;  Location: [facility] ORS;  Service: Orthopedics;  Laterality: Right;   POLYPECTOMY  02/23/2023   Procedure: POLYPECTOMY;  Surgeon: Mansouraty, Netty Starring., MD;  Location: [facility] ENDOSCOPY;  Service: Gastroenterology;;   VAGINAL HYSTERECTOMY  [phone]   Social History   Socioeconomic History   Marital status: Single    Spouse name: Not on file   Number of children: Not on file   Years of education: Not on file   Highest education level: Not on file  Occupational History   Occupation: disability  Tobacco Use   Smoking status: Every Day    Current packs/day: 0.50    Average packs/day: 0.5 packs/day for 10.0 years (5.0 ttl pk-yrs)    Types: Cigarettes   Smokeless tobacco: Never   Tobacco comments:    trying to cut back, 3/29 - only had one cigarette today, average 4 a day  Vaping Use   Vaping status: Never Used  Substance and Sexual Activity   Alcohol use: Not Currently   Drug use: Not Currently    Types: Cocaine  Comment: 05/12/2013 "tried it a few days ago; didn't like it"   Sexual activity: Not Currently  Other Topics Concern   Not on file  Social History Narrative   Not on file   Social Determinants of Health   Financial Resource Strain: High Risk (11/06/2022)   Overall Financial Resource Strain (CARDIA)    Difficulty of Paying Living Expenses: Very hard  Food Insecurity: No Food Insecurity (04/24/2023)   Hunger Vital Sign    Worried About Running Out of Food in the Last Year: Never true    Ran Out of Food in  the Last Year: Never true  Transportation Needs: No Transportation Needs (04/18/2023)   PRAPARE - Administrator, Civil Service (Medical): No    Lack of Transportation (Non-Medical): No  Physical Activity: Insufficiently Active (11/06/2022)   Exercise Vital Sign    Days of Exercise per Week: 5 days    Minutes of Exercise per Session: 20 min  Stress: No Stress Concern Present (11/06/2022)   [employer] of Occupational Health - Occupational Stress Questionnaire    Feeling of Stress : Only a little  Social Connections: Moderately Isolated (11/06/2022)   Social Connection and Isolation Panel [NHANES]    Frequency of Communication with Friends and Family: Twice a week    Frequency of Social Gatherings with Friends and Family: Once a week    Attends Religious Services: More than 4 times per year    Active Member of [employer] or Organizations: No    Attends Engineer, structural: Never    Marital Status: Never married   Family History  Problem Relation Age of Onset   Hypertension Mother    Kidney failure Mother    Heart attack Father    Hypertension Sister    Diabetes Sister    Hypertension Brother    Diabetes Sister    Allergies  Allergen Reactions   Lisinopril Cough   Prior to Admission medications   Medication Sig Start Date End Date Taking? Authorizing Provider  acetaminophen (TYLENOL) 325 MG tablet Take 650 mg by mouth every 6 (six) hours as needed for mild pain or headache.    [provider]  amLODipine (NORVASC) 10 MG tablet Take 1 tablet (10 mg total) by mouth daily. 03/07/23   Ellender Hose, NP  aspirin EC 81 MG tablet Take 1 tablet (81 mg total) by mouth daily for 28 days. Swallow whole. 04/20/23 05/18/23  Joen Laura, MD  atorvastatin (LIPITOR) 40 MG tablet TAKE 1 TABLET(40 MG) BY MOUTH DAILY 03/16/23   [name], FNP  carvedilol (COREG) 6.25 MG tablet Take 1 tablet (6.25 mg total) by mouth 2 (two) times daily with a meal. 02/23/23   Rai,  Ripudeep K, MD  FARXIGA 10 MG TABS tablet Take 1 tablet (10 mg total) by mouth daily. 03/07/23   Ellender Hose, NP  ferrous gluconate (FERGON) 324 MG tablet Take 1 tablet (324 mg total) by mouth daily with breakfast. 02/23/23   Rai, Ripudeep K, MD  gabapentin (NEURONTIN) 100 MG capsule Take 2 capsules (200 mg total) by mouth 2 (two) times daily. Patient taking differently: Take 200 mg by mouth 2 (two) times daily as needed (Nerve pain). 09/21/22   [name], FNP  hydrALAZINE (APRESOLINE) 50 MG tablet Take 1 tablet (50 mg total) by mouth 3 (three) times daily. 02/23/23   Rai, Delene Ruffini, MD  isosorbide mononitrate (IMDUR) 60 MG 24 hr tablet Take 1 tablet (60 mg total) by mouth daily.  02/24/23   Rai, Ripudeep K, MD  ondansetron (ZOFRAN) 4 MG tablet Take 1 tablet (4 mg total) by mouth every 8 (eight) hours as needed for nausea or vomiting. 02/23/23 02/23/24  Rai, Delene Ruffini, MD  polyethylene glycol (MIRALAX / GLYCOLAX) 17 g packet Take 17 g by mouth daily as needed for mild constipation or moderate constipation.    [provider]  spironolactone (ALDACTONE) 25 MG tablet Take 1 tablet (25 mg total) by mouth daily. 10/23/22   [name], FNP     Positive ROS: All other systems have been reviewed and were otherwise negative with the exception of those mentioned in the HPI and as above.  Physical Exam:  Vitals:   05/08/23 0626  BP: (!) 185/81  Pulse: 67  Resp: 16  Temp: 98.9 F (37.2 C)  SpO2: 100%   General: Alert, no acute distress Cardiovascular: No pedal edema Respiratory: No cyanosis, no use of accessory musculature GI: No organomegaly, abdomen is soft and non-tender Skin: No lesions in the area of chief complaint Neurologic: Sensation intact distally Psychiatric: Patient is competent for consent with normal mood and affect Lymphatic: No axillary or cervical lymphadenopathy  MUSCULOSKELETAL: Right ankle in a short leg splint.  Splint is well-padded.  Exposed forefoot is warm and  well-perfused with intact sensation.  No tenderness to the forefoot or proximal to the splint.  Sensation intact.  Assessment: Right trimalleolar ankle fracture with failure of syndesmosis and medial malleolus screws.  Will plan for revision open treatment of her trimalleolar ankle fracture with revision open treatment of her syndesmosis with hardware removal.   Plan: Plan for revision surgery right ankle.  We discussed the risks, benefits and alternatives of surgery which include but are not limited to wound healing complications, infection, nonunion, malunion, need for further surgery, damage to surrounding structures and continued pain.  They understand there is no guarantees to an acceptable outcome.  After weighing these risks they opted to proceed with surgery.     Terance Hart, MD    05/08/2023 6:51 AM

## 2023-05-09 ENCOUNTER — Encounter (HOSPITAL_COMMUNITY): Payer: Self-pay | Admitting: Orthopaedic Surgery

## 2023-05-10 ENCOUNTER — Other Ambulatory Visit: Payer: 59 | Admitting: Pharmacist

## 2023-05-10 NOTE — Patient Instructions (Addendum)
It was great talking with you today [name]!  Please continue taking all of your blood pressure medications and keep checking your pressure at home. Your goal blood pressure is <130/80. Please bring a log of your home blood pressure readings to your upcoming appointments with nephrology and cardiology.  We will follow up with you for a telephone visit about a month after your cardiology visit on 11/13 at 9:30 AM.  Please let us know if you have any concerns or questions.

## 2023-05-10 NOTE — Progress Notes (Signed)
05/10/2023 Name: [name] MRN: [id number] DOB: 10-26-1960  No chief complaint on file.   Myndi Schopf is a 62 y.o. year old female who presented for a telephone visit.   They were referred to the pharmacist by their PCP for assistance in managing hypertension.    Subjective: Patient reports she is doing okay this morning. Notes she is in pain from surgery 2 days ago.  Care Team: Primary Care Provider: [name], FNP ; Next Scheduled Visit: 08/23/2023 Cardiologist: Dr. Duke Salvia; Next Scheduled Visit: 06/06/2023 Nephrologist: Dr. Thedore Mins; Next Scheduled Visit: 05/24/2023  Medication Access/Adherence  Current Pharmacy:  [facility] [address] [place] [address] Phone: [phone] Fax: [phone]  [id number] [place], [place] - [address] AT [facility] [address] [place] [address] Phone: [phone] Fax: [phone]   Patient reports affordability concerns with their medications: No  Patient reports access/transportation concerns to their pharmacy: No  Patient reports adherence concerns with their medications:  No     Hypertension:  Current medications: amlodipine 10 mg daily, carvedilol 6.25 mg twice daily, Imdur 60 mg daily, hydralazine 50 mg three times daily, spironolactone 25 mg daily  Patient reports she resumed all of her blood pressure medications after surgery. Notes she is in pain from recent ankle surgeries, but BP has improved.   Patient has a validated, automated, upper arm home BP cuff  ([employer] brand) Current blood pressure readings readings: 125/58, Pulse 75  Patient denies hypotensive s/sx including dizziness, lightheadedness.  Patient denies hypertensive symptoms including headache, chest pain, shortness of breath.  Current physical activity: limited due to ankle recent surgery   Hyperlipidemia/ASCVD Risk Reduction  Current lipid lowering medications:  atorvastatin 40 mg daily  Antiplatelet regimen: ASA 81 mg twice daily   Chronic Kidney Disease  Current Medications: Farxiga 10 mg daily  Patient reports she has an appt with nephrologist, Dr. Thedore Mins on October 3rd.   Pain Management: Reported she has been taking oxycodone 5 mg every 4 hours since surgery. Reported she has not been taking oxycodone and Tylenol at the same time, but discussed that it is okay for her to take Tylenol as well and this will help her only used the oxycodone as needed.   Objective:  Lab Results  Component Value Date   HGBA1C 4.9 08/23/2021    Lab Results  Component Value Date   CREATININE 2.09 (H) 05/08/2023   BUN 39 (H) 05/08/2023   NA 138 05/08/2023   K 4.5 05/08/2023   CL 105 05/08/2023   CO2 21 (L) 05/08/2023    Lab Results  Component Value Date   CHOL 202 (H) 09/21/2022   HDL 73 09/21/2022   LDLCALC 110 (H) 09/21/2022   TRIG 109 09/21/2022   CHOLHDL 2.8 09/21/2022    Medications Reviewed Today     Reviewed by Vela Prose, RPH (Pharmacist) on 05/10/23 at [phone]  Med List Status: <None>   Medication Order Taking? Sig Documenting Provider Last Dose Status Informant  acetaminophen (TYLENOL) 325 MG tablet [id number] Yes Take 650 mg by mouth every 6 (six) hours as needed for mild pain or headache. [provider] Taking Active Self  amLODipine (NORVASC) 10 MG tablet [id number] Yes Take 1 tablet (10 mg total) by mouth daily. Ellender Hose, NP Taking Active Self           Med Note Lenoria Farrier   Tue Apr 10, 2023  2:30 PM)  aspirin EC 81 MG tablet [id number]  Take 1 tablet (81 mg total) by mouth daily for 28 days. Swallow whole. Joen Laura, MD  Active   aspirin EC 81 MG tablet [id number] Yes Take 1 tablet (81 mg total) by mouth 2 (two) times daily. For blood clot prevention. Take for 30 days after surgery. Swallow whole. [place], Jesse J, PA-C Taking Active   atorvastatin (LIPITOR) 40 MG tablet [id number] Yes TAKE 1  TABLET(40 MG) BY MOUTH DAILY [name], FNP Taking Active Self  carvedilol (COREG) 6.25 MG tablet [id number] Yes Take 1 tablet (6.25 mg total) by mouth 2 (two) times daily with a meal. Rai, Ripudeep K, MD Taking Active Self  FARXIGA 10 MG TABS tablet [id number] Yes Take 1 tablet (10 mg total) by mouth daily. Ellender Hose, NP Taking Active Self  ferrous gluconate (FERGON) 324 MG tablet [id number] Yes Take 1 tablet (324 mg total) by mouth daily with breakfast. Rai, Delene Ruffini, MD Taking Active Self  gabapentin (NEURONTIN) 100 MG capsule [id number] Yes Take 2 capsules (200 mg total) by mouth 2 (two) times daily.  Patient taking differently: Take 200 mg by mouth 2 (two) times daily as needed (Nerve pain).   [name], FNP Taking Active Self           Med Note Clearance Coots, CATHERINE T   Tue Nov 21, 2022 11:33 AM)    hydrALAZINE (APRESOLINE) 50 MG tablet [id number] Yes Take 1 tablet (50 mg total) by mouth 3 (three) times daily. Rai, Delene Ruffini, MD Taking Active Self  isosorbide mononitrate (IMDUR) 60 MG 24 hr tablet [id number] Yes Take 1 tablet (60 mg total) by mouth daily. Rai, Delene Ruffini, MD Taking Active Self  ondansetron (ZOFRAN) 4 MG tablet [id number] No Take 1 tablet (4 mg total) by mouth every 8 (eight) hours as needed for nausea or vomiting.  Patient not taking: Reported on 05/10/2023   Cathren Harsh, MD Not Taking Active Self  oxyCODONE (ROXICODONE) 5 MG immediate release tablet [id number] Yes Take 1 tablet (5 mg total) by mouth every 4 (four) hours as needed for severe pain. [place], Jesse J, PA-C Taking Active   polyethylene glycol (MIRALAX / GLYCOLAX) 17 g packet [id number] Yes Take 17 g by mouth daily as needed for mild constipation or moderate constipation. [provider] Taking Active Self  spironolactone (ALDACTONE) 25 MG tablet [id number] Yes Take 1 tablet (25 mg total) by mouth daily. [name], FNP Taking Active Self              Assessment/Plan:   Hypertension: -  Currently controlled based on patient reported home BP reading of 125/58 today. - Reviewed long term cardiovascular and renal outcomes of uncontrolled blood pressure - Reviewed appropriate blood pressure monitoring technique and reviewed goal blood pressure. Recommended to check home blood pressure and heart rate and bring log of readings to upcoming cardiology appt in October. - Recommend to continue current regimen and encouraged continued adherence.    Hyperlipidemia/ASCVD Risk Reduction: - Currently uncontrolled but suspect improvement on recheck given improved adherence - Reviewed long term complications of uncontrolled cholesterol - Recommend to check updated lipid panel at next office visit     Medication Management: - Recommended to take Tylenol up to 1000 mg three times a day prn pain - Encouraged patient to keep appt with nephrologist on October 3rd   Follow Up Plan: telephone visit ~4 weeks after cardiology visit in October   Jarrett Ables, PharmD PGY-1 Pharmacy Resident

## 2023-05-15 ENCOUNTER — Emergency Department (HOSPITAL_COMMUNITY): Payer: 59

## 2023-05-15 ENCOUNTER — Inpatient Hospital Stay (HOSPITAL_COMMUNITY)
Admission: EM | Admit: 2023-05-15 | Discharge: 2023-05-20 | DRG: 291 | Disposition: A | Discharge status: Home Health | Payer: 59 | Attending: Internal Medicine | Admitting: Internal Medicine

## 2023-05-15 ENCOUNTER — Emergency Department (HOSPITAL_COMMUNITY)
Admit: 2023-05-15 | Discharge: 2023-05-15 | Disposition: A | Discharge status: Home / Self Care | Payer: 59 | Attending: Emergency Medicine | Admitting: Emergency Medicine

## 2023-05-15 ENCOUNTER — Encounter (HOSPITAL_COMMUNITY): Payer: Self-pay

## 2023-05-15 ENCOUNTER — Other Ambulatory Visit: Payer: Self-pay

## 2023-05-15 DIAGNOSIS — I10 Essential (primary) hypertension: Secondary | ICD-10-CM | POA: Diagnosis not present

## 2023-05-15 DIAGNOSIS — R0602 Shortness of breath: Secondary | ICD-10-CM | POA: Diagnosis not present

## 2023-05-15 DIAGNOSIS — N390 Urinary tract infection, site not specified: Secondary | ICD-10-CM | POA: Diagnosis present

## 2023-05-15 DIAGNOSIS — S82891D Other fracture of right lower leg, subsequent encounter for closed fracture with routine healing: Secondary | ICD-10-CM

## 2023-05-15 DIAGNOSIS — D638 Anemia in other chronic diseases classified elsewhere: Secondary | ICD-10-CM | POA: Diagnosis not present

## 2023-05-15 DIAGNOSIS — I3139 Other pericardial effusion (noninflammatory): Principal | ICD-10-CM | POA: Diagnosis present

## 2023-05-15 DIAGNOSIS — M79604 Pain in right leg: Secondary | ICD-10-CM | POA: Diagnosis not present

## 2023-05-15 DIAGNOSIS — M79661 Pain in right lower leg: Secondary | ICD-10-CM

## 2023-05-15 DIAGNOSIS — K219 Gastro-esophageal reflux disease without esophagitis: Secondary | ICD-10-CM | POA: Diagnosis present

## 2023-05-15 DIAGNOSIS — B9620 Unspecified Escherichia coli [E. coli] as the cause of diseases classified elsewhere: Secondary | ICD-10-CM | POA: Diagnosis present

## 2023-05-15 DIAGNOSIS — D3501 Benign neoplasm of right adrenal gland: Secondary | ICD-10-CM | POA: Diagnosis not present

## 2023-05-15 DIAGNOSIS — E785 Hyperlipidemia, unspecified: Secondary | ICD-10-CM | POA: Diagnosis present

## 2023-05-15 DIAGNOSIS — Z841 Family history of disorders of kidney and ureter: Secondary | ICD-10-CM | POA: Diagnosis not present

## 2023-05-15 DIAGNOSIS — D631 Anemia in chronic kidney disease: Secondary | ICD-10-CM | POA: Diagnosis present

## 2023-05-15 DIAGNOSIS — F17210 Nicotine dependence, cigarettes, uncomplicated: Secondary | ICD-10-CM | POA: Diagnosis present

## 2023-05-15 DIAGNOSIS — I5030 Unspecified diastolic (congestive) heart failure: Secondary | ICD-10-CM

## 2023-05-15 DIAGNOSIS — Z833 Family history of diabetes mellitus: Secondary | ICD-10-CM | POA: Diagnosis not present

## 2023-05-15 DIAGNOSIS — Z7984 Long term (current) use of oral hypoglycemic drugs: Secondary | ICD-10-CM

## 2023-05-15 DIAGNOSIS — R8281 Pyuria: Secondary | ICD-10-CM | POA: Diagnosis not present

## 2023-05-15 DIAGNOSIS — Z90710 Acquired absence of both cervix and uterus: Secondary | ICD-10-CM

## 2023-05-15 DIAGNOSIS — N184 Chronic kidney disease, stage 4 (severe): Secondary | ICD-10-CM | POA: Diagnosis present

## 2023-05-15 DIAGNOSIS — I130 Hypertensive heart and chronic kidney disease with heart failure and stage 1 through stage 4 chronic kidney disease, or unspecified chronic kidney disease: Principal | ICD-10-CM | POA: Diagnosis present

## 2023-05-15 DIAGNOSIS — Z79899 Other long term (current) drug therapy: Secondary | ICD-10-CM

## 2023-05-15 DIAGNOSIS — E7800 Pure hypercholesterolemia, unspecified: Secondary | ICD-10-CM | POA: Diagnosis not present

## 2023-05-15 DIAGNOSIS — K5792 Diverticulitis of intestine, part unspecified, without perforation or abscess without bleeding: Secondary | ICD-10-CM | POA: Diagnosis not present

## 2023-05-15 DIAGNOSIS — Z743 Need for continuous supervision: Secondary | ICD-10-CM | POA: Diagnosis not present

## 2023-05-15 DIAGNOSIS — I878 Other specified disorders of veins: Secondary | ICD-10-CM | POA: Diagnosis present

## 2023-05-15 DIAGNOSIS — I5031 Acute diastolic (congestive) heart failure: Secondary | ICD-10-CM | POA: Diagnosis not present

## 2023-05-15 DIAGNOSIS — D3502 Benign neoplasm of left adrenal gland: Secondary | ICD-10-CM | POA: Diagnosis not present

## 2023-05-15 DIAGNOSIS — Z888 Allergy status to other drugs, medicaments and biological substances status: Secondary | ICD-10-CM

## 2023-05-15 DIAGNOSIS — I517 Cardiomegaly: Secondary | ICD-10-CM | POA: Diagnosis not present

## 2023-05-15 DIAGNOSIS — Z7982 Long term (current) use of aspirin: Secondary | ICD-10-CM

## 2023-05-15 DIAGNOSIS — R6889 Other general symptoms and signs: Secondary | ICD-10-CM | POA: Diagnosis not present

## 2023-05-15 DIAGNOSIS — R531 Weakness: Secondary | ICD-10-CM | POA: Diagnosis present

## 2023-05-15 DIAGNOSIS — Z8249 Family history of ischemic heart disease and other diseases of the circulatory system: Secondary | ICD-10-CM

## 2023-05-15 DIAGNOSIS — I5033 Acute on chronic diastolic (congestive) heart failure: Secondary | ICD-10-CM | POA: Diagnosis present

## 2023-05-15 DIAGNOSIS — K5732 Diverticulitis of large intestine without perforation or abscess without bleeding: Secondary | ICD-10-CM | POA: Diagnosis present

## 2023-05-15 DIAGNOSIS — R112 Nausea with vomiting, unspecified: Secondary | ICD-10-CM | POA: Diagnosis not present

## 2023-05-15 DIAGNOSIS — K5900 Constipation, unspecified: Secondary | ICD-10-CM | POA: Diagnosis not present

## 2023-05-15 HISTORY — DX: Unspecified diastolic (congestive) heart failure: I50.30

## 2023-05-15 LAB — URINALYSIS, ROUTINE W REFLEX MICROSCOPIC
Bilirubin Urine: NEGATIVE
Glucose, UA: 150 mg/dL — AB
Ketones, ur: NEGATIVE mg/dL
Nitrite: NEGATIVE
Protein, ur: 100 mg/dL — AB
Specific Gravity, Urine: 1.014 (ref 1.005–1.030)
pH: 6 (ref 5.0–8.0)

## 2023-05-15 LAB — CBC WITH DIFFERENTIAL/PLATELET
Abs Immature Granulocytes: 0.02 10*3/uL (ref 0.00–0.07)
Basophils Absolute: 0 10*3/uL (ref 0.0–0.1)
Basophils Relative: 0 %
Eosinophils Absolute: 0 10*3/uL (ref 0.0–0.5)
Eosinophils Relative: 0 %
HCT: 34.6 % — ABNORMAL LOW (ref 36.0–46.0)
Hemoglobin: 9.8 g/dL — ABNORMAL LOW (ref 12.0–15.0)
Immature Granulocytes: 0 %
Lymphocytes Relative: 9 %
Lymphs Abs: 0.7 10*3/uL (ref 0.7–4.0)
MCH: 25.6 pg — ABNORMAL LOW (ref 26.0–34.0)
MCHC: 28.3 g/dL — ABNORMAL LOW (ref 30.0–36.0)
MCV: 90.3 fL (ref 80.0–100.0)
Monocytes Absolute: 0.7 10*3/uL (ref 0.1–1.0)
Monocytes Relative: 9 %
Neutro Abs: 6.8 10*3/uL (ref 1.7–7.7)
Neutrophils Relative %: 82 %
Platelets: 365 10*3/uL (ref 150–400)
RBC: 3.83 MIL/uL — ABNORMAL LOW (ref 3.87–5.11)
RDW: 16.2 % — ABNORMAL HIGH (ref 11.5–15.5)
WBC: 8.3 10*3/uL (ref 4.0–10.5)
nRBC: 0 % (ref 0.0–0.2)

## 2023-05-15 LAB — TROPONIN I (HIGH SENSITIVITY)
Troponin I (High Sensitivity): 23 ng/L — ABNORMAL HIGH (ref <18)
Troponin I (High Sensitivity): 23 ng/L — ABNORMAL HIGH (ref <18)

## 2023-05-15 LAB — COMPREHENSIVE METABOLIC PANEL
ALT: 15 U/L (ref 0–44)
AST: 15 U/L (ref 15–41)
Albumin: 3.6 g/dL (ref 3.5–5.0)
Alkaline Phosphatase: 79 U/L (ref 38–126)
Anion gap: 15 (ref 5–15)
BUN: 28 mg/dL — ABNORMAL HIGH (ref 8–23)
CO2: 22 mmol/L (ref 22–32)
Calcium: 9 mg/dL (ref 8.9–10.3)
Chloride: 102 mmol/L (ref 98–111)
Creatinine, Ser: 1.83 mg/dL — ABNORMAL HIGH (ref 0.44–1.00)
GFR, Estimated: 31 mL/min — ABNORMAL LOW (ref >60)
Glucose, Bld: 104 mg/dL — ABNORMAL HIGH (ref 70–99)
Potassium: 4 mmol/L (ref 3.5–5.1)
Sodium: 139 mmol/L (ref 135–145)
Total Bilirubin: 1 mg/dL (ref 0.3–1.2)
Total Protein: 8.7 g/dL — ABNORMAL HIGH (ref 6.5–8.1)

## 2023-05-15 LAB — MAGNESIUM: Magnesium: 2.3 mg/dL (ref 1.7–2.4)

## 2023-05-15 LAB — BRAIN NATRIURETIC PEPTIDE: B Natriuretic Peptide: 240.7 pg/mL — ABNORMAL HIGH (ref 0.0–100.0)

## 2023-05-15 MED ORDER — SODIUM CHLORIDE 0.9 % IV SOLN
1.0000 g | Freq: Once | INTRAVENOUS | Status: DC
  Filled 2023-05-15: qty 10

## 2023-05-15 MED ORDER — ACETAMINOPHEN 325 MG PO TABS: 650.0000 mg | ORAL_TABLET | Freq: Four times a day (QID) | ORAL | Status: DC | PRN

## 2023-05-15 MED ORDER — ONDANSETRON HCL 4 MG/2ML IJ SOLN: 4.0000 mg | Freq: Four times a day (QID) | INTRAMUSCULAR | Status: DC | PRN

## 2023-05-15 MED ORDER — FENTANYL CITRATE PF 50 MCG/ML IJ SOSY
50.0000 ug | PREFILLED_SYRINGE | Freq: Once | INTRAMUSCULAR | Status: AC
  Administered 2023-05-15: 50 ug via INTRAVENOUS
  Filled 2023-05-15: qty 1

## 2023-05-15 MED ORDER — POLYETHYLENE GLYCOL 3350 17 G PO PACK: 17.0000 g | PACK | Freq: Every day | ORAL | Status: DC | PRN

## 2023-05-15 MED ORDER — FERROUS GLUCONATE 324 (38 FE) MG PO TABS
324.0000 mg | ORAL_TABLET | Freq: Every day | ORAL | Status: DC
  Administered 2023-05-17 – 2023-05-20 (×4): 324 mg via ORAL
  Filled 2023-05-15 (×6): qty 1

## 2023-05-15 MED ORDER — AMLODIPINE BESYLATE 5 MG PO TABS
10.0000 mg | ORAL_TABLET | Freq: Every day | ORAL | Status: DC
  Administered 2023-05-15 – 2023-05-20 (×6): 10 mg via ORAL
  Filled 2023-05-15 (×6): qty 2

## 2023-05-15 MED ORDER — CARVEDILOL 6.25 MG PO TABS
6.2500 mg | ORAL_TABLET | Freq: Two times a day (BID) | ORAL | Status: DC
  Administered 2023-05-15 – 2023-05-20 (×11): 6.25 mg via ORAL
  Filled 2023-05-15 (×5): qty 1
  Filled 2023-05-15: qty 2
  Filled 2023-05-15 (×5): qty 1

## 2023-05-15 MED ORDER — MELATONIN 3 MG PO TABS
3.0000 mg | ORAL_TABLET | Freq: Every evening | ORAL | Status: DC | PRN
  Administered 2023-05-18 – 2023-05-19 (×2): 3 mg via ORAL
  Filled 2023-05-15 (×2): qty 1

## 2023-05-15 MED ORDER — SODIUM CHLORIDE 0.9 % IV SOLN
2.0000 g | Freq: Once | INTRAVENOUS | Status: AC
  Administered 2023-05-15: 2 g via INTRAVENOUS
  Filled 2023-05-15: qty 20

## 2023-05-15 MED ORDER — FUROSEMIDE 10 MG/ML IJ SOLN
40.0000 mg | Freq: Two times a day (BID) | INTRAMUSCULAR | Status: DC
  Administered 2023-05-16 – 2023-05-18 (×5): 40 mg via INTRAVENOUS
  Filled 2023-05-15 (×6): qty 4

## 2023-05-15 MED ORDER — FUROSEMIDE 10 MG/ML IJ SOLN
40.0000 mg | Freq: Once | INTRAMUSCULAR | Status: AC
  Administered 2023-05-15: 40 mg via INTRAVENOUS
  Filled 2023-05-15: qty 4

## 2023-05-15 MED ORDER — SODIUM CHLORIDE 0.9 % IV SOLN
2.0000 g | INTRAVENOUS | Status: DC
  Administered 2023-05-16 – 2023-05-17 (×2): 2 g via INTRAVENOUS
  Filled 2023-05-15 (×2): qty 20

## 2023-05-15 MED ORDER — ACETAMINOPHEN 650 MG RE SUPP: 650.0000 mg | Freq: Four times a day (QID) | RECTAL | Status: DC | PRN

## 2023-05-15 MED ORDER — ATORVASTATIN CALCIUM 40 MG PO TABS
40.0000 mg | ORAL_TABLET | Freq: Every day | ORAL | Status: DC
  Administered 2023-05-16 – 2023-05-20 (×5): 40 mg via ORAL
  Filled 2023-05-15 (×5): qty 1

## 2023-05-15 MED ORDER — METRONIDAZOLE 500 MG/100ML IV SOLN
500.0000 mg | Freq: Two times a day (BID) | INTRAVENOUS | Status: DC
  Administered 2023-05-15 – 2023-05-18 (×6): 500 mg via INTRAVENOUS
  Filled 2023-05-15 (×6): qty 100

## 2023-05-15 MED ORDER — NALOXONE HCL 0.4 MG/ML IJ SOLN: 0.4000 mg | INTRAMUSCULAR | Status: DC | PRN

## 2023-05-15 MED ORDER — HYDRALAZINE HCL 50 MG PO TABS
50.0000 mg | ORAL_TABLET | Freq: Three times a day (TID) | ORAL | Status: DC
  Administered 2023-05-15 – 2023-05-20 (×16): 50 mg via ORAL
  Filled 2023-05-15 (×7): qty 1
  Filled 2023-05-15: qty 2
  Filled 2023-05-15 (×8): qty 1

## 2023-05-15 MED ORDER — HYDROMORPHONE HCL 1 MG/ML IJ SOLN
0.5000 mg | INTRAMUSCULAR | Status: DC | PRN
  Administered 2023-05-15 – 2023-05-19 (×9): 0.5 mg via INTRAVENOUS
  Filled 2023-05-15 (×9): qty 0.5

## 2023-05-15 MED ORDER — MORPHINE SULFATE (PF) 4 MG/ML IV SOLN
4.0000 mg | Freq: Once | INTRAVENOUS | Status: AC
  Administered 2023-05-15: 4 mg via INTRAVENOUS
  Filled 2023-05-15: qty 1

## 2023-05-15 MED ORDER — ASPIRIN 81 MG PO TBEC: 81.0000 mg | DELAYED_RELEASE_TABLET | Freq: Every day | ORAL | Status: DC

## 2023-05-15 NOTE — ED Provider Notes (Signed)
[place] EMERGENCY DEPARTMENT AT [facility] Provider Note   CSN: [id number] Arrival date & time: 05/15/23  1419     History {Add pertinent medical, surgical, social history, OB history to HPI:1} Chief Complaint  Patient presents with   Leg Pain    Right Leg   Weakness    [name] is a 62 y.o. female.   Leg Pain Weakness 62 year old female history of CHF, CKD, LVH, hyperlipidemia, GERD, hypertension presenting for leg pain, shortness of breath.  Patient recently had 2 different surgeries for her ankle most recently #17.  She is in a cast.  Her right ankle where she had surgery feels great.  However her right thigh above her knee has been hurting for couple days.  She has not had any trauma here.  No skin changes or swelling.  She has had some swelling to her left leg which is new.  She has history of heart failure and feels like she is retaining fluid she feels short of breath.  Had some chest pain yesterday which was brief and has since resolved.  No chest pain currently.  No history of DVT or PE.  She did not take her blood pressure medications today and her blood pressure has been high.  She has had some constipation as well and has not had a bowel movement since Friday.  She think she had a small amount of bile today which was not hard, no bleeding.  No nausea or vomiting.  No fevers or chills or cough.     Home Medications Prior to Admission medications   Medication Sig Start Date End Date Taking? Authorizing Provider  acetaminophen (TYLENOL) 325 MG tablet Take 650 mg by mouth every 6 (six) hours as needed for mild pain or headache.    [provider]  amLODipine (NORVASC) 10 MG tablet Take 1 tablet (10 mg total) by mouth daily. 03/07/23   Ellender Hose, NP  aspirin EC 81 MG tablet Take 1 tablet (81 mg total) by mouth daily for 28 days. Swallow whole. 04/20/23 05/18/23  Joen Laura, MD  aspirin EC 81 MG tablet Take 1 tablet (81 mg total) by  mouth 2 (two) times daily. For blood clot prevention. Take for 30 days after surgery. Swallow whole. 05/08/23 05/07/24  [place], Jesse J, PA-C  atorvastatin (LIPITOR) 40 MG tablet TAKE 1 TABLET(40 MG) BY MOUTH DAILY 03/16/23   [name], FNP  carvedilol (COREG) 6.25 MG tablet Take 1 tablet (6.25 mg total) by mouth 2 (two) times daily with a meal. 02/23/23   Rai, Ripudeep K, MD  FARXIGA 10 MG TABS tablet Take 1 tablet (10 mg total) by mouth daily. 03/07/23   Ellender Hose, NP  ferrous gluconate (FERGON) 324 MG tablet Take 1 tablet (324 mg total) by mouth daily with breakfast. 02/23/23   Rai, Ripudeep K, MD  gabapentin (NEURONTIN) 100 MG capsule Take 2 capsules (200 mg total) by mouth 2 (two) times daily. Patient taking differently: Take 200 mg by mouth 2 (two) times daily as needed (Nerve pain). 09/21/22   [name], FNP  hydrALAZINE (APRESOLINE) 50 MG tablet Take 1 tablet (50 mg total) by mouth 3 (three) times daily. 02/23/23   Rai, Delene Ruffini, MD  isosorbide mononitrate (IMDUR) 60 MG 24 hr tablet Take 1 tablet (60 mg total) by mouth daily. 02/24/23   Rai, Ripudeep K, MD  ondansetron (ZOFRAN) 4 MG tablet Take 1 tablet (4 mg total) by mouth every 8 (eight) hours as  needed for nausea or vomiting. Patient not taking: Reported on 05/10/2023 02/23/23 02/23/24  Rai, Delene Ruffini, MD  oxyCODONE (ROXICODONE) 5 MG immediate release tablet Take 1 tablet (5 mg total) by mouth every 4 (four) hours as needed for severe pain. 05/08/23   [place], Jesse J, PA-C  polyethylene glycol (MIRALAX / GLYCOLAX) 17 g packet Take 17 g by mouth daily as needed for mild constipation or moderate constipation.    [provider]  spironolactone (ALDACTONE) 25 MG tablet Take 1 tablet (25 mg total) by mouth daily. 10/23/22   [name], FNP      Allergies    Lisinopril    Review of Systems   Review of Systems  Neurological:  Positive for weakness.  Review of systems completed and notable as per HPI.  ROS otherwise  negative.   Physical Exam Updated Vital Signs BP (!) 224/96 (BP Location: Right Arm)   Pulse 78   Temp 98.6 F (37 C) (Oral)   Resp 16   SpO2 98%  Physical Exam Vitals and nursing note reviewed.  Constitutional:      General: She is not in acute distress.    Appearance: She is well-developed.  HENT:     Head: Normocephalic and atraumatic.     Nose: Nose normal.     Mouth/Throat:     Mouth: Mucous membranes are moist.     Pharynx: Oropharynx is clear.  Eyes:     Extraocular Movements: Extraocular movements intact.     Conjunctiva/sclera: Conjunctivae normal.     Pupils: Pupils are equal, round, and reactive to light.  Cardiovascular:     Rate and Rhythm: Normal rate and regular rhythm.     Pulses: Normal pulses.     Heart sounds: Murmur heard.     Comments: Systolic murmur Pulmonary:     Comments: Slightly increased work of breathing.  Mildly diminished breath sounds at the bases.  No wheezing or clear rales. Abdominal:     General: There is distension.     Palpations: Abdomen is soft.     Tenderness: There is abdominal tenderness. There is no guarding or rebound.  Musculoskeletal:        General: No swelling.     Cervical back: Neck supple.     Right lower leg: No edema.     Left lower leg: Edema present.     Comments: No swelling or tenderness to the right leg.  She has pain to the right thigh but no skin changes.  She has 2+ pitting edema to the left calf.  She has palpable DP pulses bilaterally.  Normal capillary refill in both feet and normal sensation and strength.  Skin:    General: Skin is warm and dry.     Capillary Refill: Capillary refill takes less than 2 seconds.  Neurological:     General: No focal deficit present.     Mental Status: She is alert and oriented to person, place, and time. Mental status is at baseline.  Psychiatric:        Mood and Affect: Mood normal.     ED Results / Procedures / Treatments   Labs (all labs ordered are listed, but  only abnormal results are displayed) Labs Reviewed - No data to display  EKG None  Radiology No results found.  Procedures Procedures  {Document cardiac monitor, telemetry assessment procedure when appropriate:1}  Medications Ordered in ED Medications - No data to display  ED Course/ Medical Decision Making/ A&P   {  Click here for ABCD2, HEART and other calculatorsREFRESH Note before signing :1}                              Medical Decision Making Amount and/or Complexity of Data Reviewed Labs: ordered. Radiology: ordered.  Risk Prescription drug management.   Medical Decision Making:   Kashawna Villaverde is a 62 y.o. female who presented to the ED today with leg pain, swelling, abdominal pain, shortness of breath.  Vital signs notable for hypertension likely related to chronic severe hypertension not taking her medications.  She has had 2 recent surgeries, now has pain to her right thigh and swelling to her left leg concern for possible DVT.  She has no pain or swelling to her right ankle or calf, lower suspicion for DVT here.  She does have a cast already in place.  She is neurovascularly intact, no signs of skin or soft tissue infection.  She does have some slightly increased work of breathing, consider possible heart failure exacerbation.  I do hear a murmur which seems to be inconsistently documented, unclear if this is new.  I considered dissection, however testing is only brief completely resolved lower suspicion.  Consider PE as well, she does have significant chronic kidney disease and favor volume overload so we will hold on CTA chest at this time.  Abdominal exam is slightly distended and tender more likely related to constipation, consider possible obstruction although she is not vomiting and did have a small bowel movement today.  Will give home medications for blood pressure.   {crccomplexity:27900} Reviewed and confirmed nursing documentation for past medical  history, family history, social history.  Reassessment and Plan:   ***    Patient's presentation is most consistent with {EM COPA:27473}     {Document critical care time when appropriate:1} {Document review of labs and clinical decision tools ie heart score, Chads2Vasc2 etc:1}  {Document your independent review of radiology images, and any outside records:1} {Document your discussion with family members, caretakers, and with consultants:1} {Document social determinants of health affecting pt's care:1} {Document your decision making why or why not admission, treatments were needed:1} Final Clinical Impression(s) / ED Diagnoses Final diagnoses:  None    Rx / DC Orders ED Discharge Orders     None

## 2023-05-15 NOTE — Progress Notes (Signed)
Bilateral lower extremity venous duplex has been completed. Preliminary results can be found in CV Proc through chart review.  Results were given to Dr. Earlene Plater.  05/15/23 3:44 PM Olen Cordial RVT

## 2023-05-15 NOTE — ED Triage Notes (Signed)
Pt BIB EMS from Home. Pt c/o weakness, fatigue, 9/10 pain in right thigh from hip to knee starting yesterday.  Pt fell Aug 30th and fractured right ankle. Had 2 surgeries since, most recent being last Tuesday. Yesterday pt c/o n/v in the morning, no n/v today. Pt states last Bowel movement was last Monday. Hx of CHF, HTN.  In Route: Fentanyl 100 mcg 20ga LAC  BP 194/91 HR 72 CBG 94 SpO2 99 RA

## 2023-05-15 NOTE — ED Notes (Signed)
Call placed to [address] to request the nurse to review pt SBAR at this time, as pt bed has been ready for approx. 1 hour currently. Awaiting notification that staff has reviewed so pt can be transferred upstairs.

## 2023-05-15 NOTE — ED Notes (Addendum)
ED TO INPATIENT HANDOFF REPORT  ED Nurse Name and Phone #: Valeta Harms, RN [phone]  S Name/Age/Gender [name] 62 y.o. female Room/Bed: WA07/WA07  Code Status   Code Status: Full Code  Home/SNF/Other Home Patient oriented to: self, place, time, and situation Is this baseline? Yes   Triage Complete: Triage complete  Chief Complaint Acute on chronic diastolic heart failure (HCC) [I50.33]  Triage Note Pt BIB EMS from Home. Pt c/o weakness, fatigue, 9/10 pain in right thigh from hip to knee starting yesterday.  Pt fell Aug 30th and fractured right ankle. Had 2 surgeries since, most recent being last Tuesday. Yesterday pt c/o n/v in the morning, no n/v today. Pt states last Bowel movement was last Monday. Hx of CHF, HTN.  In Route: Fentanyl 100 mcg 20ga LAC  BP 194/91 HR 72 CBG 94 SpO2 99 RA    Allergies Allergies  Allergen Reactions   Lisinopril Cough    Level of Care/Admitting Diagnosis ED Disposition     ED Disposition  Admit   Condition  --   Comment  Hospital Area: [facility] [[id number]]  Level of Care: Progressive [102]  Admit to Progressive based on following criteria: MULTISYSTEM THREATS such as stable sepsis, metabolic/electrolyte imbalance with or without encephalopathy that is responding to early treatment.  May admit patient to [facility] or [name] if equivalent level of care is available:: No  Covid Evaluation: Asymptomatic - no recent exposure (last 10 days) testing not required  Diagnosis: Acute on chronic diastolic heart failure (HCC) [428.33.ICD-9-CM]  Admitting Physician: Angie Fava [[id number]]  Attending Physician: Angie Fava [[id number]]  Certification:: I certify this patient will need inpatient services for at least 2 midnights  Expected Medical Readiness: 05/17/2023          B Medical/Surgery History Past Medical History:  Diagnosis Date   Adrenal adenoma 01/11/2021   Anemia    CHF  (congestive heart failure) (HCC)    Chronic bronchitis (HCC)    "probably once/yr" (05/12/2013)   Chronic kidney disease    Diastolic heart failure    Dyslipidemia    pt denies this hx on 05/12/2013   Exertional shortness of breath    GERD (gastroesophageal reflux disease)    Heart murmur    Hypertension    Hypokalemia    Left ventricular hypertrophy 01/11/2021   Venous stasis ulcers (HCC)    Past Surgical History:  Procedure Laterality Date   BIOPSY  02/23/2023   Procedure: BIOPSY;  Surgeon: Lemar Lofty., MD;  Location: [facility] ENDOSCOPY;  Service: Gastroenterology;;   COLONOSCOPY N/A 02/23/2023   Procedure: COLONOSCOPY;  Surgeon: Lemar Lofty., MD;  Location[facility] ENDOSCOPY;  Service: Gastroenterology;  Laterality: N/A;   ESOPHAGOGASTRODUODENOSCOPY N/A 02/23/2023   Procedure: ESOPHAGOGASTRODUODENOSCOPY (EGD);  Surgeon: Lemar Lofty., MD;  Location: [facility] ENDOSCOPY;  Service: Gastroenterology;  Laterality: N/A;   HARDWARE REMOVAL Right 05/08/2023   Procedure: HARDWARE REMOVAL MEDIAL AND LATERAL;  Surgeon: Terance Hart, MD;  Location: [facility] ORS;  Service: Orthopedics;  Laterality: Right;   HOT HEMOSTASIS N/A 02/23/2023   Procedure: HOT HEMOSTASIS (ARGON PLASMA COAGULATION/BICAP);  Surgeon: Lemar Lofty., MD;  Location: [facility] ENDOSCOPY;  Service: Gastroenterology;  Laterality: N/A;   IR US GUIDE VASC ACCESS RIGHT  02/27/2017   IR VENOGRAM ADRENAL BI  02/27/2017   IR VENOGRAM HEPATIC WO HEMODYNAMIC EVALUATION  02/27/2017   IR VENOGRAM RENAL BI  02/27/2017   IR VENOUS SAMPLING  02/27/2017   IR  VENOUS SAMPLING  02/27/2017   ORIF ANKLE FRACTURE Right 04/18/2023   Procedure: OPEN REDUCTION INTERNAL FIXATION (ORIF) ANKLE FRACTURE;  Surgeon: Joen Laura, MD;  Location: [facility] ORS;  Service: Orthopedics;  Laterality: Right;   ORIF ANKLE FRACTURE Right 05/08/2023   Procedure: REVISION OPEN REDUCTION INTERNAL FIXATION (ORIF) OF RIGHT TRIMALLEOLAR ANKLE FRACTURE, OPEN  TREATMENT OF SYNDESMOSIS;  Surgeon: Terance Hart, MD;  Location: [facility] ORS;  Service: Orthopedics;  Laterality: Right;   POLYPECTOMY  02/23/2023   Procedure: POLYPECTOMY;  Surgeon: Meridee Score Netty Starring., MD;  Location: [facility] ENDOSCOPY;  Service: Gastroenterology;;   SYNDESMOSIS REPAIR Right 05/08/2023   Procedure: SYNDESMOSIS REPAIR;  Surgeon: Terance Hart, MD;  Location: [facility] ORS;  Service: Orthopedics;  Laterality: Right;   VAGINAL HYSTERECTOMY  1990's     A IV Location/Drains/Wounds Patient Lines/Drains/Airways Status     Active Line/Drains/Airways     Name Placement date Placement time Site Days   Peripheral IV 05/15/23 20 G Left Antecubital 05/15/23  1454  Antecubital  less than 1            Intake/Output Last 24 hours No intake or output data in the 24 hours ending 05/15/23 1957  Labs/Imaging Results for orders placed or performed during the hospital encounter of 05/15/23 (from the past 48 hour(s))  Comprehensive metabolic panel     Status: Abnormal   Collection Time: 05/15/23  4:48 PM  Result Value Ref Range   Sodium 139 135 - 145 mmol/L   Potassium 4.0 3.5 - 5.1 mmol/L   Chloride 102 98 - 111 mmol/L   CO2 22 22 - 32 mmol/L   Glucose, Bld 104 (H) 70 - 99 mg/dL    Comment: Glucose reference range applies only to samples taken after fasting for at least 8 hours.   BUN 28 (H) 8 - 23 mg/dL   Creatinine, Ser 5.78 (H) 0.44 - 1.00 mg/dL   Calcium 9.0 8.9 - 46.9 mg/dL   Total Protein 8.7 (H) 6.5 - 8.1 g/dL   Albumin 3.6 3.5 - 5.0 g/dL   AST 15 15 - 41 U/L   ALT 15 0 - 44 U/L   Alkaline Phosphatase 79 38 - 126 U/L   Total Bilirubin 1.0 0.3 - 1.2 mg/dL   GFR, Estimated 31 (L) >60 mL/min    Comment: (NOTE) Calculated using the CKD-EPI Creatinine Equation (2021)    Anion gap 15 5 - 15    Comment: Performed at [facility], [address], [place], [place] [address]  CBC with Differential     Status: Abnormal   Collection Time: 05/15/23  4:48  PM  Result Value Ref Range   WBC 8.3 4.0 - 10.5 K/uL   RBC 3.83 (L) 3.87 - 5.11 MIL/uL   Hemoglobin 9.8 (L) 12.0 - 15.0 g/dL   HCT 84.1 (L) 32.4 - 40.1 %   MCV 90.3 80.0 - 100.0 fL   MCH 25.6 (L) 26.0 - 34.0 pg   MCHC 28.3 (L) 30.0 - 36.0 g/dL   RDW 02.7 (H) 25.3 - 66.4 %   Platelets 365 150 - 400 K/uL   nRBC 0.0 0.0 - 0.2 %   Neutrophils Relative % 82 %   Neutro Abs 6.8 1.7 - 7.7 K/uL   Lymphocytes Relative 9 %   Lymphs Abs 0.7 0.7 - 4.0 K/uL   Monocytes Relative 9 %   Monocytes Absolute 0.7 0.1 - 1.0 K/uL   Eosinophils Relative 0 %   Eosinophils Absolute  0.0 0.0 - 0.5 K/uL   Basophils Relative 0 %   Basophils Absolute 0.0 0.0 - 0.1 K/uL   Immature Granulocytes 0 %   Abs Immature Granulocytes 0.02 0.00 - 0.07 K/uL    Comment: Performed at [facility], [address], [place], [place] [address]  Troponin I (High Sensitivity)     Status: Abnormal   Collection Time: 05/15/23  4:48 PM  Result Value Ref Range   Troponin I (High Sensitivity) 23 (H) <18 ng/L    Comment: (NOTE) Elevated high sensitivity troponin I (hsTnI) values and significant  changes across serial measurements may suggest ACS but many other  chronic and acute conditions are known to elevate hsTnI results.  Refer to the "Links" section for chest pain algorithms and additional  guidance. Performed at [facility], [address], [place], [place] [address]   Urinalysis, Routine w reflex microscopic -Urine, Clean Catch     Status: Abnormal   Collection Time: 05/15/23  4:48 PM  Result Value Ref Range   Color, Urine YELLOW YELLOW   APPearance HAZY (A) CLEAR   Specific Gravity, Urine 1.014 1.005 - 1.030   pH 6.0 5.0 - 8.0   Glucose, UA 150 (A) NEGATIVE mg/dL   Hgb urine dipstick MODERATE (A) NEGATIVE   Bilirubin Urine NEGATIVE NEGATIVE   Ketones, ur NEGATIVE NEGATIVE mg/dL   Protein, ur 098 (A) NEGATIVE mg/dL   Nitrite NEGATIVE NEGATIVE   Leukocytes,Ua MODERATE (A)  NEGATIVE   RBC / HPF 11-20 0 - 5 RBC/hpf   WBC, UA 11-20 0 - 5 WBC/hpf   Bacteria, UA MANY (A) NONE SEEN   Squamous Epithelial / HPF 0-5 0 - 5 /HPF   Hyaline Casts, UA PRESENT     Comment: Performed at [facility], [address] [place], [place] [address]  Brain natriuretic peptide     Status: Abnormal   Collection Time: 05/15/23  4:48 PM  Result Value Ref Range   B Natriuretic Peptide 240.7 (H) 0.0 - 100.0 pg/mL    Comment: Performed at [facility], [address] [place], [place] [address]  Troponin I (High Sensitivity)     Status: Abnormal   Collection Time: 05/15/23  5:36 PM  Result Value Ref Range   Troponin I (High Sensitivity) 23 (H) <18 ng/L    Comment: (NOTE) Elevated high sensitivity troponin I (hsTnI) values and significant  changes across serial measurements may suggest ACS but many other  chronic and acute conditions are known to elevate hsTnI results.  Refer to the "Links" section for chest pain algorithms and additional  guidance. Performed at [facility], [address], [place], [place] [address]    CT ABDOMEN PELVIS WO CONTRAST  Result Date: 05/15/2023 CLINICAL DATA:  Acute abdominal pain EXAM: CT ABDOMEN AND PELVIS WITHOUT CONTRAST TECHNIQUE: Multidetector CT imaging of the abdomen and pelvis was performed following the standard protocol without IV contrast. RADIATION DOSE REDUCTION: This exam was performed according to the departmental dose-optimization program which includes automated exposure control, adjustment of the mA and/or kV according to patient size and/or use of iterative reconstruction technique. COMPARISON:  02/05/2017 and previous FINDINGS: Lower chest: Small pericardial effusion. No pleural effusion. Atheromatous aorta. Visualized lung bases clear. Hepatobiliary: No focal liver abnormality is seen. No gallstones, gallbladder wall thickening, or biliary dilatation. Pancreas: Unremarkable. No  pancreatic ductal dilatation or surrounding inflammatory changes. Spleen: Normal in size without focal abnormality. Adrenals/Urinary Tract: 4.3 cm  3 HU right adrenal adenoma, slowly enlarging since 2009; no follow-up indicated. Symmetric renal contours without focal lesion, urolithiasis, or hydronephrosis. Urinary bladder physiologically distended. Stomach/Bowel: Stomach is decompressed. Small bowel is nondilated. Colon is partially distended with scattered diverticula. Focal inflammatory/edematous changes the medial to the proximal ascending colon at the level of a prominent diverticulum, without abscess. There is moderate fecal distention of the rectum up to 8.5 cm diameter. Vascular/Lymphatic: Scattered aortoiliac calcified plaque without aneurysm. No abdominal or pelvic adenopathy. Reproductive: Status post hysterectomy. No adnexal masses. Other: No ascites.  No free air. Musculoskeletal: Small paraumbilical hernia containing only mesenteric fat. Mild facet DJD in the lower lumbar spine. IMPRESSION: 1. Acute diverticulitis of the proximal ascending colon without abscess. 2. Small pericardial effusion. 3.  Aortic Atherosclerosis (ICD10-I70.0). Electronically Signed   By: Corlis Leak M.D.   On: 05/15/2023 17:21   DG Chest 2 View  Result Date: 05/15/2023 CLINICAL DATA:  Shortness of breath. EXAM: CHEST - 2 VIEW COMPARISON:  December 04, 2020. FINDINGS: Mild cardiomegaly is noted. Both lungs are clear. The visualized skeletal structures are unremarkable. IMPRESSION: No active cardiopulmonary disease. Electronically Signed   By: Lupita Raider M.D.   On: 05/15/2023 17:18   VAS [place] LOWER EXTREMITY VENOUS (DVT) (7a-7p)  Result Date: 05/15/2023  Lower Venous DVT Study Patient Name:  [name]  Date of Exam:   05/15/2023 Medical Rec #: [id number]         Accession #:    [id number] Date of Birth: 1960/10/10         Patient Gender: F Patient Age:   41 years Exam Location:  [facility] Procedure:      VAS  [place] LOWER EXTREMITY VENOUS (DVT) Referring Phys: Marja Kays DAVIS --------------------------------------------------------------------------------  Indications: Pain.  Risk Factors: Trauma. Limitations: Body habitus, poor ultrasound/tissue interface, orthopaedic appliance and Right lower leg hard cast. Comparison Study: No prior studies. Performing Technologist: Chanda Busing RVT  Examination Guidelines: A complete evaluation includes B-mode imaging, spectral Doppler, color Doppler, and power Doppler as needed of all accessible portions of each vessel. Bilateral testing is considered an integral part of a complete examination. Limited examinations for reoccurring indications may be performed as noted. The reflux portion of the exam is performed with the patient in reverse Trendelenburg.  +---------+---------------+---------+-----------+----------+-------------------+ RIGHT    CompressibilityPhasicitySpontaneityPropertiesThrombus Aging      +---------+---------------+---------+-----------+----------+-------------------+ CFV      Full           Yes      Yes                                      +---------+---------------+---------+-----------+----------+-------------------+ SFJ      Full                                                             +---------+---------------+---------+-----------+----------+-------------------+ FV Prox  Full                                                             +---------+---------------+---------+-----------+----------+-------------------+  FV Mid   Full                                                             +---------+---------------+---------+-----------+----------+-------------------+ FV DistalFull                                                             +---------+---------------+---------+-----------+----------+-------------------+ PFV      Full                                                              +---------+---------------+---------+-----------+----------+-------------------+ POP      Full           Yes      Yes                                      +---------+---------------+---------+-----------+----------+-------------------+ PTV                                                   Not well visualized +---------+---------------+---------+-----------+----------+-------------------+ PERO                                                  Not well visualized +---------+---------------+---------+-----------+----------+-------------------+   +---------+---------------+---------+-----------+----------+-------------------+ LEFT     CompressibilityPhasicitySpontaneityPropertiesThrombus Aging      +---------+---------------+---------+-----------+----------+-------------------+ CFV      Full           Yes      Yes                                      +---------+---------------+---------+-----------+----------+-------------------+ SFJ      Full                                                             +---------+---------------+---------+-----------+----------+-------------------+ FV Prox  Full                                                             +---------+---------------+---------+-----------+----------+-------------------+ FV Mid   Full                                                             +---------+---------------+---------+-----------+----------+-------------------+  FV Distal               Yes      Yes                                      +---------+---------------+---------+-----------+----------+-------------------+ PFV      Full                                                             +---------+---------------+---------+-----------+----------+-------------------+ POP      Full           Yes      Yes                                      +---------+---------------+---------+-----------+----------+-------------------+  PTV      Full                                                             +---------+---------------+---------+-----------+----------+-------------------+ PERO                                                  Not well visualized +---------+---------------+---------+-----------+----------+-------------------+     Summary: RIGHT: - There is no evidence of deep vein thrombosis in the lower extremity. However, portions of this examination were limited- see technologist comments above.  - No cystic structure found in the popliteal fossa.  LEFT: - There is no evidence of deep vein thrombosis in the lower extremity. However, portions of this examination were limited- see technologist comments above.  - No cystic structure found in the popliteal fossa.  *See table(s) above for measurements and observations.    Preliminary     Pending Labs Unresulted Labs (From admission, onward)     Start     Ordered   05/16/23 0500  CBC with Differential/Platelet  Tomorrow morning,   R        05/15/23 1940   05/16/23 0500  Comprehensive metabolic panel  Tomorrow morning,   R        05/15/23 1940   05/16/23 0500  Magnesium  Tomorrow morning,   R        05/15/23 1940   05/15/23 1941  Magnesium  Add-on,   AD        05/15/23 1940            Vitals/Pain Today's Vitals   05/15/23 1645 05/15/23 1654 05/15/23 1835 05/15/23 1906  BP: (!) 180/82   (!) 169/85  Pulse: 88   76  Resp: 18   14  Temp:  98.6 F (37 C)  99.1 F (37.3 C)  TempSrc:  Oral  Oral  SpO2: 100%   98%  PainSc:   8  8     Isolation Precautions No active isolations  Medications Medications  amLODipine (NORVASC)  tablet 10 mg (10 mg Oral Given 05/15/23 1527)  carvedilol (COREG) tablet 6.25 mg (6.25 mg Oral Given 05/15/23 1651)  hydrALAZINE (APRESOLINE) tablet 50 mg (50 mg Oral Given 05/15/23 1527)  furosemide (LASIX) injection 40 mg (has no administration in time range)  cefTRIAXone (ROCEPHIN) 1 g in sodium chloride 0.9 % 100 mL  IVPB (has no administration in time range)  acetaminophen (TYLENOL) tablet 650 mg (has no administration in time range)    Or  acetaminophen (TYLENOL) suppository 650 mg (has no administration in time range)  melatonin tablet 3 mg (has no administration in time range)  ondansetron (ZOFRAN) injection 4 mg (has no administration in time range)  furosemide (LASIX) injection 40 mg (has no administration in time range)  morphine (PF) 4 MG/ML injection 4 mg (4 mg Intravenous Given 05/15/23 1735)  fentaNYL (SUBLIMAZE) injection 50 mcg (50 mcg Intravenous Given 05/15/23 1858)    Mobility non-ambulatory     Focused Assessments Cardiac Assessment Handoff:    Lab Results  Component Value Date   CKTOTAL 106 06/12/2013   CKMB 2.1 12/29/2010   TROPONINI <0.03 09/06/2018   Lab Results  Component Value Date   DDIMER <0.27 12/17/2014   Does the Patient currently have chest pain? No    R Recommendations: See Admitting Provider Note  Report given to:   Additional Notes: R Trimalleolar Ankle fracture that is s/p ORIF 05/10/2023. Pt reports she has been getting around via wheelchair at home, and has an order from her ortho for a knee scooter. Pt able to help herself to the [facility] and back to bed with x1 staff assist.

## 2023-05-15 NOTE — H&P (Addendum)
History and Physical      [name] VHQ:[id number] DOB: 1961-07-30 DOA: 05/15/2023; DOS: 05/15/2023  PCP: [name], FNP  Patient coming from: home   I have personally briefly reviewed patient's old medical records in [facility]  Chief Complaint: sob  HPI: [name] is a 62 y.o. female with medical history significant for chronic diastolic heart failure, essential hypertension, anemia of chronic diseases of basal hemoglobin 8-10, CKD stage IV associated baseline creatinine 1.7-2.5, who is admitted to [facility] on 05/15/2023 with acute on chronic diastolic heart failure after presenting from home to [facility] ED complaining of shortness of breath.   The patient reports 3 to 4 days of progressive shortness of breath associate with orthopnea, as well as worsening of edema in the bilateral lower extremities, associated with some pitting.  Denies any associated subjective fever, chills or rigors, or generalized manage urinalysis with any wheezing, cough, chest pain, palpitations, diaphoresis, dizziness Recent B, syncope, nausea, vomiting.  No recent pleuritic chest discomfort or hemoptysis.  Denies any recent calf tenderness or new lower extremity erythema.  She confirms a history of chronic diastolic heart failure, with most recent echocardiogram performed in January 2020, which showed severe LVH, LVEF 66 5%, no evidence of focal Measurin advised, grade 1 diastolic dysfunction, trivial mitral gravitation, trivial aortic regurgitation and normal right ventricular systolic function.  In terms of outpatient diuretic medications, she is on spironolactone.  Not on any loop diuretics at home.  She also complains of to 3 days of progressive crampy generalized abdominal discomfort, worse in the right lower quadrant, with associated exacerbation with palpation over this area.  No recent trauma.  Denies any associated diarrhea, melena, or hematochezia.  No recent dysuria, gross  hematuria, or change in urinary urgency/frequency.  She recently underwent ORIF of the right ankle on 04/18/2023, with ensuing revision on 05/08/2023.    ED Course:  Vital signs in the ED were notable for the following: Afebrile; heart rates in the range of 70s to 80s; systolic blood pressures initially in the 220s, 1 teens improvement in the 160s following interval improvement in her abdominal discomfort with multiple IV opioid medications, as further detailed below; respiratory rate 14-18, oxygen saturation 98-1 high percent room air.  Labs were notable for the following: CMP notable for the following: Sodium 139 potassium 4.0, bicarbonate 22, creatinine 1.83 compared most recent prior value of 2.09 on 05/08/2023, liver enzymes within normal limits.  BNP 240, relative to 80 in April 2022.  High sensitive troponin I x 2 were Biphetamine 23, relative to most recent prior value of 26 in April 2022, CBC notable for white cell count 8300, hemoglobin 9.8 associated with normocytic properties and relative demonstration prior hemoglobin did 1 of 9.3 on 05/08/2023, with a count 365.  Urinalysis associated with hazy appearing specimen with 11-20 white blood cells, many bacteria, moderate leukocyte esterase, and no squamous epithelial cells.  Per my interpretation, EKG in ED demonstrated the following: EKG was performed in the ED, but has not yet been released.  Imaging in the ED, per corresponding formal radiology read, was notable for the following: 2 view chest x-ray shows cardiomegaly, without overt evidence of infiltrate, edema, effusion, or pneumothorax.  CT abdomen/pelvis without contrast showed evidence of acute diverticulitis of the proximal ascending colon in the absence of evidence of abscess, perforation, or obstruction.  Otherwise, no reported acute intra-abdominal or acute intrapelvic process.  Venous ultrasound of the bilateral lower extremities showed no evidence of acute  with scattered diverticula. Focal inflammatory/edematous changes the medial to the proximal ascending colon at the level of a prominent diverticulum, without abscess. There is moderate fecal distention of the rectum up to 8.5 cm diameter. Vascular/Lymphatic: Scattered aortoiliac calcified plaque without aneurysm. No abdominal or pelvic adenopathy. Reproductive: Status post hysterectomy. No adnexal masses. Other: No ascites.  No free air. Musculoskeletal: Small paraumbilical hernia containing only mesenteric fat. Mild facet DJD in the lower lumbar spine. IMPRESSION: 1. Acute diverticulitis of the proximal ascending colon without abscess. 2. Small pericardial effusion. 3.  Aortic Atherosclerosis (ICD10-I70.0). Electronically Signed   By: Corlis Leak M.D.   On: 05/15/2023 17:21   DG Chest 2 View  Result Date: 05/15/2023 CLINICAL DATA:  Shortness of breath. EXAM: CHEST - 2 VIEW COMPARISON:  December 04, 2020. FINDINGS: Mild cardiomegaly is noted. Both lungs are clear. The visualized skeletal structures are unremarkable. IMPRESSION: No active cardiopulmonary disease. Electronically Signed   By: Lupita Raider M.D.   On: 05/15/2023 17:18   VAS [place] LOWER EXTREMITY VENOUS (DVT) (7a-7p)  Result Date: 05/15/2023  Lower Venous DVT Study Patient Name:  [name]  Date  of Exam:   05/15/2023 Medical Rec #: [id number]         Accession #:    [id number] Date of Birth: 15-Jul-1961         Patient Gender: F Patient Age:   40 years Exam Location:  [facility] Procedure:      VAS [place] LOWER EXTREMITY VENOUS (DVT) Referring Phys: Marja Kays DAVIS --------------------------------------------------------------------------------  Indications: Pain.  Risk Factors: Trauma. Limitations: Body habitus, poor ultrasound/tissue interface, orthopaedic appliance and Right lower leg hard cast. Comparison Study: No prior studies. Performing Technologist: Chanda Busing RVT  Examination Guidelines: A complete evaluation includes B-mode imaging, spectral Doppler, color Doppler, and power Doppler as needed of all accessible portions of each vessel. Bilateral testing is considered an integral part of a complete examination. Limited examinations for reoccurring indications may be performed as noted. The reflux portion of the exam is performed with the patient in reverse Trendelenburg.  +---------+---------------+---------+-----------+----------+-------------------+ RIGHT    CompressibilityPhasicitySpontaneityPropertiesThrombus Aging      +---------+---------------+---------+-----------+----------+-------------------+ CFV      Full           Yes      Yes                                      +---------+---------------+---------+-----------+----------+-------------------+ SFJ      Full                                                             +---------+---------------+---------+-----------+----------+-------------------+ FV Prox  Full                                                             +---------+---------------+---------+-----------+----------+-------------------+ FV Mid   Full                                                             +---------+---------------+---------+-----------+----------+-------------------+  with scattered diverticula. Focal inflammatory/edematous changes the medial to the proximal ascending colon at the level of a prominent diverticulum, without abscess. There is moderate fecal distention of the rectum up to 8.5 cm diameter. Vascular/Lymphatic: Scattered aortoiliac calcified plaque without aneurysm. No abdominal or pelvic adenopathy. Reproductive: Status post hysterectomy. No adnexal masses. Other: No ascites.  No free air. Musculoskeletal: Small paraumbilical hernia containing only mesenteric fat. Mild facet DJD in the lower lumbar spine. IMPRESSION: 1. Acute diverticulitis of the proximal ascending colon without abscess. 2. Small pericardial effusion. 3.  Aortic Atherosclerosis (ICD10-I70.0). Electronically Signed   By: Corlis Leak M.D.   On: 05/15/2023 17:21   DG Chest 2 View  Result Date: 05/15/2023 CLINICAL DATA:  Shortness of breath. EXAM: CHEST - 2 VIEW COMPARISON:  December 04, 2020. FINDINGS: Mild cardiomegaly is noted. Both lungs are clear. The visualized skeletal structures are unremarkable. IMPRESSION: No active cardiopulmonary disease. Electronically Signed   By: Lupita Raider M.D.   On: 05/15/2023 17:18   VAS [place] LOWER EXTREMITY VENOUS (DVT) (7a-7p)  Result Date: 05/15/2023  Lower Venous DVT Study Patient Name:  [name]  Date  of Exam:   05/15/2023 Medical Rec #: [id number]         Accession #:    [id number] Date of Birth: 15-Jul-1961         Patient Gender: F Patient Age:   40 years Exam Location:  [facility] Procedure:      VAS [place] LOWER EXTREMITY VENOUS (DVT) Referring Phys: Marja Kays DAVIS --------------------------------------------------------------------------------  Indications: Pain.  Risk Factors: Trauma. Limitations: Body habitus, poor ultrasound/tissue interface, orthopaedic appliance and Right lower leg hard cast. Comparison Study: No prior studies. Performing Technologist: Chanda Busing RVT  Examination Guidelines: A complete evaluation includes B-mode imaging, spectral Doppler, color Doppler, and power Doppler as needed of all accessible portions of each vessel. Bilateral testing is considered an integral part of a complete examination. Limited examinations for reoccurring indications may be performed as noted. The reflux portion of the exam is performed with the patient in reverse Trendelenburg.  +---------+---------------+---------+-----------+----------+-------------------+ RIGHT    CompressibilityPhasicitySpontaneityPropertiesThrombus Aging      +---------+---------------+---------+-----------+----------+-------------------+ CFV      Full           Yes      Yes                                      +---------+---------------+---------+-----------+----------+-------------------+ SFJ      Full                                                             +---------+---------------+---------+-----------+----------+-------------------+ FV Prox  Full                                                             +---------+---------------+---------+-----------+----------+-------------------+ FV Mid   Full                                                             +---------+---------------+---------+-----------+----------+-------------------+  History and Physical      [name] VHQ:[id number] DOB: 1961-07-30 DOA: 05/15/2023; DOS: 05/15/2023  PCP: [name], FNP  Patient coming from: home   I have personally briefly reviewed patient's old medical records in [facility]  Chief Complaint: sob  HPI: [name] is a 62 y.o. female with medical history significant for chronic diastolic heart failure, essential hypertension, anemia of chronic diseases of basal hemoglobin 8-10, CKD stage IV associated baseline creatinine 1.7-2.5, who is admitted to [facility] on 05/15/2023 with acute on chronic diastolic heart failure after presenting from home to [facility] ED complaining of shortness of breath.   The patient reports 3 to 4 days of progressive shortness of breath associate with orthopnea, as well as worsening of edema in the bilateral lower extremities, associated with some pitting.  Denies any associated subjective fever, chills or rigors, or generalized manage urinalysis with any wheezing, cough, chest pain, palpitations, diaphoresis, dizziness Recent B, syncope, nausea, vomiting.  No recent pleuritic chest discomfort or hemoptysis.  Denies any recent calf tenderness or new lower extremity erythema.  She confirms a history of chronic diastolic heart failure, with most recent echocardiogram performed in January 2020, which showed severe LVH, LVEF 66 5%, no evidence of focal Measurin advised, grade 1 diastolic dysfunction, trivial mitral gravitation, trivial aortic regurgitation and normal right ventricular systolic function.  In terms of outpatient diuretic medications, she is on spironolactone.  Not on any loop diuretics at home.  She also complains of to 3 days of progressive crampy generalized abdominal discomfort, worse in the right lower quadrant, with associated exacerbation with palpation over this area.  No recent trauma.  Denies any associated diarrhea, melena, or hematochezia.  No recent dysuria, gross  hematuria, or change in urinary urgency/frequency.  She recently underwent ORIF of the right ankle on 04/18/2023, with ensuing revision on 05/08/2023.    ED Course:  Vital signs in the ED were notable for the following: Afebrile; heart rates in the range of 70s to 80s; systolic blood pressures initially in the 220s, 1 teens improvement in the 160s following interval improvement in her abdominal discomfort with multiple IV opioid medications, as further detailed below; respiratory rate 14-18, oxygen saturation 98-1 high percent room air.  Labs were notable for the following: CMP notable for the following: Sodium 139 potassium 4.0, bicarbonate 22, creatinine 1.83 compared most recent prior value of 2.09 on 05/08/2023, liver enzymes within normal limits.  BNP 240, relative to 80 in April 2022.  High sensitive troponin I x 2 were Biphetamine 23, relative to most recent prior value of 26 in April 2022, CBC notable for white cell count 8300, hemoglobin 9.8 associated with normocytic properties and relative demonstration prior hemoglobin did 1 of 9.3 on 05/08/2023, with a count 365.  Urinalysis associated with hazy appearing specimen with 11-20 white blood cells, many bacteria, moderate leukocyte esterase, and no squamous epithelial cells.  Per my interpretation, EKG in ED demonstrated the following: EKG was performed in the ED, but has not yet been released.  Imaging in the ED, per corresponding formal radiology read, was notable for the following: 2 view chest x-ray shows cardiomegaly, without overt evidence of infiltrate, edema, effusion, or pneumothorax.  CT abdomen/pelvis without contrast showed evidence of acute diverticulitis of the proximal ascending colon in the absence of evidence of abscess, perforation, or obstruction.  Otherwise, no reported acute intra-abdominal or acute intrapelvic process.  Venous ultrasound of the bilateral lower extremities showed no evidence of acute  History and Physical      [name] VHQ:[id number] DOB: 1961-07-30 DOA: 05/15/2023; DOS: 05/15/2023  PCP: [name], FNP  Patient coming from: home   I have personally briefly reviewed patient's old medical records in [facility]  Chief Complaint: sob  HPI: [name] is a 62 y.o. female with medical history significant for chronic diastolic heart failure, essential hypertension, anemia of chronic diseases of basal hemoglobin 8-10, CKD stage IV associated baseline creatinine 1.7-2.5, who is admitted to [facility] on 05/15/2023 with acute on chronic diastolic heart failure after presenting from home to [facility] ED complaining of shortness of breath.   The patient reports 3 to 4 days of progressive shortness of breath associate with orthopnea, as well as worsening of edema in the bilateral lower extremities, associated with some pitting.  Denies any associated subjective fever, chills or rigors, or generalized manage urinalysis with any wheezing, cough, chest pain, palpitations, diaphoresis, dizziness Recent B, syncope, nausea, vomiting.  No recent pleuritic chest discomfort or hemoptysis.  Denies any recent calf tenderness or new lower extremity erythema.  She confirms a history of chronic diastolic heart failure, with most recent echocardiogram performed in January 2020, which showed severe LVH, LVEF 66 5%, no evidence of focal Measurin advised, grade 1 diastolic dysfunction, trivial mitral gravitation, trivial aortic regurgitation and normal right ventricular systolic function.  In terms of outpatient diuretic medications, she is on spironolactone.  Not on any loop diuretics at home.  She also complains of to 3 days of progressive crampy generalized abdominal discomfort, worse in the right lower quadrant, with associated exacerbation with palpation over this area.  No recent trauma.  Denies any associated diarrhea, melena, or hematochezia.  No recent dysuria, gross  hematuria, or change in urinary urgency/frequency.  She recently underwent ORIF of the right ankle on 04/18/2023, with ensuing revision on 05/08/2023.    ED Course:  Vital signs in the ED were notable for the following: Afebrile; heart rates in the range of 70s to 80s; systolic blood pressures initially in the 220s, 1 teens improvement in the 160s following interval improvement in her abdominal discomfort with multiple IV opioid medications, as further detailed below; respiratory rate 14-18, oxygen saturation 98-1 high percent room air.  Labs were notable for the following: CMP notable for the following: Sodium 139 potassium 4.0, bicarbonate 22, creatinine 1.83 compared most recent prior value of 2.09 on 05/08/2023, liver enzymes within normal limits.  BNP 240, relative to 80 in April 2022.  High sensitive troponin I x 2 were Biphetamine 23, relative to most recent prior value of 26 in April 2022, CBC notable for white cell count 8300, hemoglobin 9.8 associated with normocytic properties and relative demonstration prior hemoglobin did 1 of 9.3 on 05/08/2023, with a count 365.  Urinalysis associated with hazy appearing specimen with 11-20 white blood cells, many bacteria, moderate leukocyte esterase, and no squamous epithelial cells.  Per my interpretation, EKG in ED demonstrated the following: EKG was performed in the ED, but has not yet been released.  Imaging in the ED, per corresponding formal radiology read, was notable for the following: 2 view chest x-ray shows cardiomegaly, without overt evidence of infiltrate, edema, effusion, or pneumothorax.  CT abdomen/pelvis without contrast showed evidence of acute diverticulitis of the proximal ascending colon in the absence of evidence of abscess, perforation, or obstruction.  Otherwise, no reported acute intra-abdominal or acute intrapelvic process.  Venous ultrasound of the bilateral lower extremities showed no evidence of acute  with scattered diverticula. Focal inflammatory/edematous changes the medial to the proximal ascending colon at the level of a prominent diverticulum, without abscess. There is moderate fecal distention of the rectum up to 8.5 cm diameter. Vascular/Lymphatic: Scattered aortoiliac calcified plaque without aneurysm. No abdominal or pelvic adenopathy. Reproductive: Status post hysterectomy. No adnexal masses. Other: No ascites.  No free air. Musculoskeletal: Small paraumbilical hernia containing only mesenteric fat. Mild facet DJD in the lower lumbar spine. IMPRESSION: 1. Acute diverticulitis of the proximal ascending colon without abscess. 2. Small pericardial effusion. 3.  Aortic Atherosclerosis (ICD10-I70.0). Electronically Signed   By: Corlis Leak M.D.   On: 05/15/2023 17:21   DG Chest 2 View  Result Date: 05/15/2023 CLINICAL DATA:  Shortness of breath. EXAM: CHEST - 2 VIEW COMPARISON:  December 04, 2020. FINDINGS: Mild cardiomegaly is noted. Both lungs are clear. The visualized skeletal structures are unremarkable. IMPRESSION: No active cardiopulmonary disease. Electronically Signed   By: Lupita Raider M.D.   On: 05/15/2023 17:18   VAS [place] LOWER EXTREMITY VENOUS (DVT) (7a-7p)  Result Date: 05/15/2023  Lower Venous DVT Study Patient Name:  [name]  Date  of Exam:   05/15/2023 Medical Rec #: [id number]         Accession #:    [id number] Date of Birth: 15-Jul-1961         Patient Gender: F Patient Age:   40 years Exam Location:  [facility] Procedure:      VAS [place] LOWER EXTREMITY VENOUS (DVT) Referring Phys: Marja Kays DAVIS --------------------------------------------------------------------------------  Indications: Pain.  Risk Factors: Trauma. Limitations: Body habitus, poor ultrasound/tissue interface, orthopaedic appliance and Right lower leg hard cast. Comparison Study: No prior studies. Performing Technologist: Chanda Busing RVT  Examination Guidelines: A complete evaluation includes B-mode imaging, spectral Doppler, color Doppler, and power Doppler as needed of all accessible portions of each vessel. Bilateral testing is considered an integral part of a complete examination. Limited examinations for reoccurring indications may be performed as noted. The reflux portion of the exam is performed with the patient in reverse Trendelenburg.  +---------+---------------+---------+-----------+----------+-------------------+ RIGHT    CompressibilityPhasicitySpontaneityPropertiesThrombus Aging      +---------+---------------+---------+-----------+----------+-------------------+ CFV      Full           Yes      Yes                                      +---------+---------------+---------+-----------+----------+-------------------+ SFJ      Full                                                             +---------+---------------+---------+-----------+----------+-------------------+ FV Prox  Full                                                             +---------+---------------+---------+-----------+----------+-------------------+ FV Mid   Full                                                             +---------+---------------+---------+-----------+----------+-------------------+  with scattered diverticula. Focal inflammatory/edematous changes the medial to the proximal ascending colon at the level of a prominent diverticulum, without abscess. There is moderate fecal distention of the rectum up to 8.5 cm diameter. Vascular/Lymphatic: Scattered aortoiliac calcified plaque without aneurysm. No abdominal or pelvic adenopathy. Reproductive: Status post hysterectomy. No adnexal masses. Other: No ascites.  No free air. Musculoskeletal: Small paraumbilical hernia containing only mesenteric fat. Mild facet DJD in the lower lumbar spine. IMPRESSION: 1. Acute diverticulitis of the proximal ascending colon without abscess. 2. Small pericardial effusion. 3.  Aortic Atherosclerosis (ICD10-I70.0). Electronically Signed   By: Corlis Leak M.D.   On: 05/15/2023 17:21   DG Chest 2 View  Result Date: 05/15/2023 CLINICAL DATA:  Shortness of breath. EXAM: CHEST - 2 VIEW COMPARISON:  December 04, 2020. FINDINGS: Mild cardiomegaly is noted. Both lungs are clear. The visualized skeletal structures are unremarkable. IMPRESSION: No active cardiopulmonary disease. Electronically Signed   By: Lupita Raider M.D.   On: 05/15/2023 17:18   VAS [place] LOWER EXTREMITY VENOUS (DVT) (7a-7p)  Result Date: 05/15/2023  Lower Venous DVT Study Patient Name:  [name]  Date  of Exam:   05/15/2023 Medical Rec #: [id number]         Accession #:    [id number] Date of Birth: 15-Jul-1961         Patient Gender: F Patient Age:   40 years Exam Location:  [facility] Procedure:      VAS [place] LOWER EXTREMITY VENOUS (DVT) Referring Phys: Marja Kays DAVIS --------------------------------------------------------------------------------  Indications: Pain.  Risk Factors: Trauma. Limitations: Body habitus, poor ultrasound/tissue interface, orthopaedic appliance and Right lower leg hard cast. Comparison Study: No prior studies. Performing Technologist: Chanda Busing RVT  Examination Guidelines: A complete evaluation includes B-mode imaging, spectral Doppler, color Doppler, and power Doppler as needed of all accessible portions of each vessel. Bilateral testing is considered an integral part of a complete examination. Limited examinations for reoccurring indications may be performed as noted. The reflux portion of the exam is performed with the patient in reverse Trendelenburg.  +---------+---------------+---------+-----------+----------+-------------------+ RIGHT    CompressibilityPhasicitySpontaneityPropertiesThrombus Aging      +---------+---------------+---------+-----------+----------+-------------------+ CFV      Full           Yes      Yes                                      +---------+---------------+---------+-----------+----------+-------------------+ SFJ      Full                                                             +---------+---------------+---------+-----------+----------+-------------------+ FV Prox  Full                                                             +---------+---------------+---------+-----------+----------+-------------------+ FV Mid   Full                                                             +---------+---------------+---------+-----------+----------+-------------------+  History and Physical      [name] VHQ:[id number] DOB: 1961-07-30 DOA: 05/15/2023; DOS: 05/15/2023  PCP: [name], FNP  Patient coming from: home   I have personally briefly reviewed patient's old medical records in [facility]  Chief Complaint: sob  HPI: [name] is a 62 y.o. female with medical history significant for chronic diastolic heart failure, essential hypertension, anemia of chronic diseases of basal hemoglobin 8-10, CKD stage IV associated baseline creatinine 1.7-2.5, who is admitted to [facility] on 05/15/2023 with acute on chronic diastolic heart failure after presenting from home to [facility] ED complaining of shortness of breath.   The patient reports 3 to 4 days of progressive shortness of breath associate with orthopnea, as well as worsening of edema in the bilateral lower extremities, associated with some pitting.  Denies any associated subjective fever, chills or rigors, or generalized manage urinalysis with any wheezing, cough, chest pain, palpitations, diaphoresis, dizziness Recent B, syncope, nausea, vomiting.  No recent pleuritic chest discomfort or hemoptysis.  Denies any recent calf tenderness or new lower extremity erythema.  She confirms a history of chronic diastolic heart failure, with most recent echocardiogram performed in January 2020, which showed severe LVH, LVEF 66 5%, no evidence of focal Measurin advised, grade 1 diastolic dysfunction, trivial mitral gravitation, trivial aortic regurgitation and normal right ventricular systolic function.  In terms of outpatient diuretic medications, she is on spironolactone.  Not on any loop diuretics at home.  She also complains of to 3 days of progressive crampy generalized abdominal discomfort, worse in the right lower quadrant, with associated exacerbation with palpation over this area.  No recent trauma.  Denies any associated diarrhea, melena, or hematochezia.  No recent dysuria, gross  hematuria, or change in urinary urgency/frequency.  She recently underwent ORIF of the right ankle on 04/18/2023, with ensuing revision on 05/08/2023.    ED Course:  Vital signs in the ED were notable for the following: Afebrile; heart rates in the range of 70s to 80s; systolic blood pressures initially in the 220s, 1 teens improvement in the 160s following interval improvement in her abdominal discomfort with multiple IV opioid medications, as further detailed below; respiratory rate 14-18, oxygen saturation 98-1 high percent room air.  Labs were notable for the following: CMP notable for the following: Sodium 139 potassium 4.0, bicarbonate 22, creatinine 1.83 compared most recent prior value of 2.09 on 05/08/2023, liver enzymes within normal limits.  BNP 240, relative to 80 in April 2022.  High sensitive troponin I x 2 were Biphetamine 23, relative to most recent prior value of 26 in April 2022, CBC notable for white cell count 8300, hemoglobin 9.8 associated with normocytic properties and relative demonstration prior hemoglobin did 1 of 9.3 on 05/08/2023, with a count 365.  Urinalysis associated with hazy appearing specimen with 11-20 white blood cells, many bacteria, moderate leukocyte esterase, and no squamous epithelial cells.  Per my interpretation, EKG in ED demonstrated the following: EKG was performed in the ED, but has not yet been released.  Imaging in the ED, per corresponding formal radiology read, was notable for the following: 2 view chest x-ray shows cardiomegaly, without overt evidence of infiltrate, edema, effusion, or pneumothorax.  CT abdomen/pelvis without contrast showed evidence of acute diverticulitis of the proximal ascending colon in the absence of evidence of abscess, perforation, or obstruction.  Otherwise, no reported acute intra-abdominal or acute intrapelvic process.  Venous ultrasound of the bilateral lower extremities showed no evidence of acute  History and Physical      [name] VHQ:[id number] DOB: 1961-07-30 DOA: 05/15/2023; DOS: 05/15/2023  PCP: [name], FNP  Patient coming from: home   I have personally briefly reviewed patient's old medical records in [facility]  Chief Complaint: sob  HPI: [name] is a 62 y.o. female with medical history significant for chronic diastolic heart failure, essential hypertension, anemia of chronic diseases of basal hemoglobin 8-10, CKD stage IV associated baseline creatinine 1.7-2.5, who is admitted to [facility] on 05/15/2023 with acute on chronic diastolic heart failure after presenting from home to [facility] ED complaining of shortness of breath.   The patient reports 3 to 4 days of progressive shortness of breath associate with orthopnea, as well as worsening of edema in the bilateral lower extremities, associated with some pitting.  Denies any associated subjective fever, chills or rigors, or generalized manage urinalysis with any wheezing, cough, chest pain, palpitations, diaphoresis, dizziness Recent B, syncope, nausea, vomiting.  No recent pleuritic chest discomfort or hemoptysis.  Denies any recent calf tenderness or new lower extremity erythema.  She confirms a history of chronic diastolic heart failure, with most recent echocardiogram performed in January 2020, which showed severe LVH, LVEF 66 5%, no evidence of focal Measurin advised, grade 1 diastolic dysfunction, trivial mitral gravitation, trivial aortic regurgitation and normal right ventricular systolic function.  In terms of outpatient diuretic medications, she is on spironolactone.  Not on any loop diuretics at home.  She also complains of to 3 days of progressive crampy generalized abdominal discomfort, worse in the right lower quadrant, with associated exacerbation with palpation over this area.  No recent trauma.  Denies any associated diarrhea, melena, or hematochezia.  No recent dysuria, gross  hematuria, or change in urinary urgency/frequency.  She recently underwent ORIF of the right ankle on 04/18/2023, with ensuing revision on 05/08/2023.    ED Course:  Vital signs in the ED were notable for the following: Afebrile; heart rates in the range of 70s to 80s; systolic blood pressures initially in the 220s, 1 teens improvement in the 160s following interval improvement in her abdominal discomfort with multiple IV opioid medications, as further detailed below; respiratory rate 14-18, oxygen saturation 98-1 high percent room air.  Labs were notable for the following: CMP notable for the following: Sodium 139 potassium 4.0, bicarbonate 22, creatinine 1.83 compared most recent prior value of 2.09 on 05/08/2023, liver enzymes within normal limits.  BNP 240, relative to 80 in April 2022.  High sensitive troponin I x 2 were Biphetamine 23, relative to most recent prior value of 26 in April 2022, CBC notable for white cell count 8300, hemoglobin 9.8 associated with normocytic properties and relative demonstration prior hemoglobin did 1 of 9.3 on 05/08/2023, with a count 365.  Urinalysis associated with hazy appearing specimen with 11-20 white blood cells, many bacteria, moderate leukocyte esterase, and no squamous epithelial cells.  Per my interpretation, EKG in ED demonstrated the following: EKG was performed in the ED, but has not yet been released.  Imaging in the ED, per corresponding formal radiology read, was notable for the following: 2 view chest x-ray shows cardiomegaly, without overt evidence of infiltrate, edema, effusion, or pneumothorax.  CT abdomen/pelvis without contrast showed evidence of acute diverticulitis of the proximal ascending colon in the absence of evidence of abscess, perforation, or obstruction.  Otherwise, no reported acute intra-abdominal or acute intrapelvic process.  Venous ultrasound of the bilateral lower extremities showed no evidence of acute

## 2023-05-16 ENCOUNTER — Inpatient Hospital Stay (HOSPITAL_COMMUNITY): Payer: 59

## 2023-05-16 DIAGNOSIS — I5031 Acute diastolic (congestive) heart failure: Secondary | ICD-10-CM | POA: Diagnosis not present

## 2023-05-16 DIAGNOSIS — K5792 Diverticulitis of intestine, part unspecified, without perforation or abscess without bleeding: Secondary | ICD-10-CM | POA: Diagnosis not present

## 2023-05-16 DIAGNOSIS — N184 Chronic kidney disease, stage 4 (severe): Secondary | ICD-10-CM | POA: Diagnosis not present

## 2023-05-16 DIAGNOSIS — I5033 Acute on chronic diastolic (congestive) heart failure: Secondary | ICD-10-CM | POA: Diagnosis not present

## 2023-05-16 DIAGNOSIS — D638 Anemia in other chronic diseases classified elsewhere: Secondary | ICD-10-CM | POA: Diagnosis not present

## 2023-05-16 LAB — TROPONIN I (HIGH SENSITIVITY): Troponin I (High Sensitivity): 23 ng/L — ABNORMAL HIGH (ref <18)

## 2023-05-16 LAB — COMPREHENSIVE METABOLIC PANEL
ALT: 14 U/L (ref 0–44)
AST: 14 U/L — ABNORMAL LOW (ref 15–41)
Albumin: 3.4 g/dL — ABNORMAL LOW (ref 3.5–5.0)
Alkaline Phosphatase: 72 U/L (ref 38–126)
Anion gap: 9 (ref 5–15)
BUN: 29 mg/dL — ABNORMAL HIGH (ref 8–23)
CO2: 24 mmol/L (ref 22–32)
Calcium: 8.7 mg/dL — ABNORMAL LOW (ref 8.9–10.3)
Chloride: 103 mmol/L (ref 98–111)
Creatinine, Ser: 1.94 mg/dL — ABNORMAL HIGH (ref 0.44–1.00)
GFR, Estimated: 29 mL/min — ABNORMAL LOW (ref >60)
Glucose, Bld: 107 mg/dL — ABNORMAL HIGH (ref 70–99)
Potassium: 3.7 mmol/L (ref 3.5–5.1)
Sodium: 136 mmol/L (ref 135–145)
Total Bilirubin: 0.5 mg/dL (ref 0.3–1.2)
Total Protein: 7.9 g/dL (ref 6.5–8.1)

## 2023-05-16 LAB — PROTIME-INR
INR: 1.2 (ref 0.8–1.2)
Prothrombin Time: 15.2 seconds (ref 11.4–15.2)

## 2023-05-16 LAB — CBC WITH DIFFERENTIAL/PLATELET
Abs Immature Granulocytes: 0.04 10*3/uL (ref 0.00–0.07)
Basophils Absolute: 0 10*3/uL (ref 0.0–0.1)
Basophils Relative: 0 %
Eosinophils Absolute: 0 10*3/uL (ref 0.0–0.5)
Eosinophils Relative: 0 %
HCT: 32.6 % — ABNORMAL LOW (ref 36.0–46.0)
Hemoglobin: 9.3 g/dL — ABNORMAL LOW (ref 12.0–15.0)
Immature Granulocytes: 0 %
Lymphocytes Relative: 10 %
Lymphs Abs: 0.9 10*3/uL (ref 0.7–4.0)
MCH: 26.1 pg (ref 26.0–34.0)
MCHC: 28.5 g/dL — ABNORMAL LOW (ref 30.0–36.0)
MCV: 91.3 fL (ref 80.0–100.0)
Monocytes Absolute: 1 10*3/uL (ref 0.1–1.0)
Monocytes Relative: 10 %
Neutro Abs: 7.7 10*3/uL (ref 1.7–7.7)
Neutrophils Relative %: 80 %
Platelets: 344 10*3/uL (ref 150–400)
RBC: 3.57 MIL/uL — ABNORMAL LOW (ref 3.87–5.11)
RDW: 16.1 % — ABNORMAL HIGH (ref 11.5–15.5)
WBC: 9.7 10*3/uL (ref 4.0–10.5)
nRBC: 0 % (ref 0.0–0.2)

## 2023-05-16 LAB — ECHOCARDIOGRAM COMPLETE
Area-P 1/2: 2.4 cm2
S' Lateral: 3 cm
Weight: 3456.81 oz

## 2023-05-16 LAB — MAGNESIUM: Magnesium: 2.4 mg/dL (ref 1.7–2.4)

## 2023-05-16 MED ORDER — OXYCODONE HCL 5 MG PO TABS
5.0000 mg | ORAL_TABLET | ORAL | Status: DC | PRN
  Administered 2023-05-16 – 2023-05-20 (×13): 5 mg via ORAL
  Filled 2023-05-16 (×13): qty 1

## 2023-05-16 MED ORDER — HEPARIN SODIUM (PORCINE) 5000 UNIT/ML IJ SOLN
5000.0000 [IU] | Freq: Three times a day (TID) | INTRAMUSCULAR | Status: DC
  Administered 2023-05-16 – 2023-05-20 (×13): 5000 [IU] via SUBCUTANEOUS
  Filled 2023-05-16 (×13): qty 1

## 2023-05-16 MED ORDER — SPIRONOLACTONE 25 MG PO TABS
25.0000 mg | ORAL_TABLET | Freq: Every day | ORAL | Status: DC
  Administered 2023-05-16 – 2023-05-20 (×5): 25 mg via ORAL
  Filled 2023-05-16 (×5): qty 1

## 2023-05-16 MED ORDER — ISOSORBIDE MONONITRATE ER 30 MG PO TB24
30.0000 mg | ORAL_TABLET | Freq: Every day | ORAL | Status: DC
  Administered 2023-05-16 – 2023-05-17 (×2): 30 mg via ORAL
  Filled 2023-05-16 (×2): qty 1

## 2023-05-16 MED ORDER — FUROSEMIDE 40 MG PO TABS
80.0000 mg | ORAL_TABLET | Freq: Once | ORAL | Status: AC
  Administered 2023-05-16: 80 mg via ORAL
  Filled 2023-05-16: qty 2

## 2023-05-16 MED ORDER — ASPIRIN 81 MG PO TBEC
81.0000 mg | DELAYED_RELEASE_TABLET | Freq: Two times a day (BID) | ORAL | Status: DC
  Administered 2023-05-16 – 2023-05-20 (×9): 81 mg via ORAL
  Filled 2023-05-16 (×10): qty 1

## 2023-05-16 MED ORDER — METHOCARBAMOL 500 MG PO TABS: 500.0000 mg | ORAL_TABLET | Freq: Four times a day (QID) | ORAL | Status: DC | PRN

## 2023-05-16 NOTE — TOC Initial Note (Addendum)
Transition of Care [facility]) - Initial/Assessment Note    Patient Details  Name: [name] MRN: [id number] Date of Birth: 1960-12-15  [facility] CM/SW Contact:    Lanier Clam, RN Phone Number: 05/16/2023, 2:06 PM  Clinical Narrative: Patient in agreement to [facility] no preference awaiting HHC agency to accept. Has own transport home.      -2p-[facility] rep Marylene Land accepted for HHPT await if HHPT needed they can provide.             Expected Discharge Plan: Home w Home Health Services Barriers to Discharge: Continued Medical Work up   Patient Goals and CMS Choice Patient states their goals for this hospitalization and ongoing recovery are:: Home CMS Medicare.gov Compare Post Acute Care list provided to:: Patient Choice offered to / list presented to : Patient      Expected Discharge Plan and Services   Discharge Planning Services: CM Consult Post Acute Care Choice: Home Health Living arrangements for the past 2 months: Single Family Home                                      Prior Living Arrangements/Services Living arrangements for the past 2 months: Single Family Home Lives with:: Adult Children              Current home services: DME (rw,w/c,crutches)    Activities of Daily Living   ADL Screening (condition at time of admission) Does the patient have a NEW difficulty with bathing/dressing/toileting/self-feeding that is expected to last >3 days?: Yes (Initiates electronic notice to provider for possible OT consult) Does the patient have a NEW difficulty with getting in/out of bed, walking, or climbing stairs that is expected to last >3 days?: Yes (Initiates electronic notice to provider for possible PT consult) Does the patient have a NEW difficulty with communication that is expected to last >3 days?: No Is the patient deaf or have difficulty hearing?: No Does the patient have difficulty seeing, even when wearing glasses/contacts?: No Does the  patient have difficulty concentrating, remembering, or making decisions?: No  Permission Sought/Granted                  Emotional Assessment              Admission diagnosis:  Acute on chronic diastolic heart failure (HCC) [I50.33] Patient Active Problem List   Diagnosis Date Noted   Acute on chronic diastolic heart failure (HCC) 05/15/2023   Acute diverticulitis 05/15/2023   Pyuria, sterile 05/15/2023   Chronic kidney disease (CKD), stage IV (severe) (HCC) 05/15/2023   Closed right ankle fracture 04/18/2023   Tobacco dependence 03/07/2023   Rectal bleeding 02/22/2023   Anemia of chronic disease 02/21/2023   Bright red blood per rectum 02/21/2023   Microcytic anemia 02/21/2023   Murmur, heart 02/19/2023   Pica in adults 02/19/2023   Vomiting 02/19/2023   Claudication, intermittent (HCC) 09/21/2022   Adrenal adenoma 01/11/2021   Left ventricular hypertrophy 01/11/2021   Cocaine use disorder (HCC) 12/05/2020   Atypical angina 10/11/2020   Stress 02/27/2019   Hypoglycemia without diagnosis of diabetes mellitus 09/06/2018   CKD (chronic kidney disease) stage 3, GFR 30-59 ml/min (HCC) 09/06/2018   Abdominal pain 09/06/2018   Chest pain 09/05/2018   Hypertensive urgency 09/05/2018   Wrist joint effusion, right 05/30/2017   Hypertension due to endocrine disorder 01/17/2017   Hyperaldosteronism (HCC) 01/17/2017  AKI (acute kidney injury) (HCC) 10/19/2015   Varicose veins of left lower extremity with complications 10/05/2015   Anemia 12/25/2014   Encounter for preventive care 11/11/2014   Tobacco abuse 11/17/2013   Diastolic dysfunction without heart failure 06/11/2013   GERD (gastroesophageal reflux disease) 06/11/2013   Essential hypertension 05/12/2013   Hyperlipidemia 05/12/2013   PCP:  [name], FNP Pharmacy:   [facility] [phone] - [place], [place] - [address] AT [facility] [address] [place] [address] Phone: [phone] Fax:  [phone]     Social Determinants of Health (SDOH) Social History: SDOH Screenings   Food Insecurity: No Food Insecurity (05/15/2023)  Housing: Patient Declined (05/15/2023)  Transportation Needs: No Transportation Needs (05/15/2023)  Utilities: Not At Risk (05/15/2023)  Depression (PHQ2-9): Low Risk  (11/06/2022)  Financial Resource Strain: High Risk (11/06/2022)  Physical Activity: Insufficiently Active (11/06/2022)  Social Connections: Moderately Isolated (11/06/2022)  Stress: No Stress Concern Present (11/06/2022)  Tobacco Use: High Risk (05/15/2023)   SDOH Interventions:     Readmission Risk Interventions     No data to display

## 2023-05-16 NOTE — Progress Notes (Signed)
Assumed care of patient, agree with off going RN assessment

## 2023-05-16 NOTE — Progress Notes (Addendum)
Estimated Creatinine Clearance: 36.8 mL/min (A) (by C-G formula based on SCr of 1.94 mg/dL (H)).  Liver Function Tests: Recent Labs  Lab 05/15/23 1648 05/16/23 0524  AST 15 14*  ALT 15 14  ALKPHOS 79 72  BILITOT 1.0 0.5  PROT 8.7* 7.9  ALBUMIN 3.6 3.4*    CBG: No results for input(s): "GLUCAP" in the last 168 hours.   No results found for this or any previous visit (from the past 240 hour(s)).       Radiology Studies: VAS [place] LOWER EXTREMITY VENOUS (DVT) (7a-7p)  Result Date: 05/16/2023  Lower Venous DVT Study Patient Name:  [name]  Date of Exam:   05/15/2023 Medical Rec #: [id number]         Accession #:    [id number] Date of Birth: [birth year]         Patient Gender: F Patient Age:   62 years Exam Location:  [facility] Procedure:      VAS [place] LOWER EXTREMITY VENOUS (DVT) Referring Phys: Marja Kays DAVIS --------------------------------------------------------------------------------  Indications: Pain.  Risk Factors: Trauma. Limitations: Body habitus, poor ultrasound/tissue  interface, orthopaedic appliance and Right lower leg hard cast. Comparison Study: No prior studies. Performing Technologist: Chanda Busing RVT  Examination Guidelines: A complete evaluation includes B-mode imaging, spectral Doppler, color Doppler, and power Doppler as needed of all accessible portions of each vessel. Bilateral testing is considered an integral part of a complete examination. Limited examinations for reoccurring indications may be performed as noted. The reflux portion of the exam is performed with the patient in reverse Trendelenburg.  +---------+---------------+---------+-----------+----------+-------------------+ RIGHT    CompressibilityPhasicitySpontaneityPropertiesThrombus Aging      +---------+---------------+---------+-----------+----------+-------------------+ CFV      Full           Yes      Yes                                      +---------+---------------+---------+-----------+----------+-------------------+ SFJ      Full                                                             +---------+---------------+---------+-----------+----------+-------------------+ FV Prox  Full                                                             +---------+---------------+---------+-----------+----------+-------------------+ FV Mid   Full                                                             +---------+---------------+---------+-----------+----------+-------------------+ FV DistalFull                                                             +---------+---------------+---------+-----------+----------+-------------------+  PROGRESS NOTE    [name]  GMW:[id number] DOB: [birth year] DOA: 05/15/2023 PCP: [name], FNP    Chief Complaint  Patient presents with   Leg Pain    Right Leg   Weakness    Brief Narrative:  Patient pleasant 62 year old female history of chronic diastolic CHF, hypertension, anemia of chronic disease with baseline hemoglobin 8-10, CKD stage IV with baseline creatinine 1.7-2.5 admitted [name], ED with acute on chronic diastolic CHF as well as acute diverticulitis.   Assessment & Plan:   Principal Problem:   Acute on chronic diastolic heart failure (HCC) Active Problems:   Essential hypertension   Hyperlipidemia   Anemia of chronic disease   Acute diverticulitis   Pyuria, sterile   Chronic kidney disease (CKD), stage IV (severe) (HCC)   #1 acute on chronic diastolic CHF -Patient presented with 3 to 4-day history of progressive shortness of breath, orthopnea, bilateral lower extremity edema. -Chest x-ray done with cardiomegaly. -BNP noted elevated at 240.7. -??  Etiology. -Cardiac enzymes flattened at 23, patient denies any ongoing chest pain. -2D echo obtained with a EF of 60 to 65%,NWMA, grade 1 diastolic dysfunction, severely dilated left atrial size, mildly dilated right atrial size.  Mild AVR. -Patient placed on Lasix 40 mg IV every 12 hours, urine output not properly recorded. -Patient unable to receive Lasix this morning due to limited venous access, no IV access at this time, seen by IV team and unable to place IV. -Will ask PCCM to see if central line can be placed as patient with CKD stage IV and unable to get a PICC line at this time. -Will give Lasix 80 mg p.o. x 1 while awaiting IV access. -Resume home regimen spironolactone. -Strict I's and O's, daily weights, IV Lasix.  2.  Acute diverticulitis -Patient on presentation with complaints of right-sided lower quadrant abdominal pain and some progressive generalized abdominal discomfort x 2 to 3  days. -CT abdomen and pelvis done on admission with acute diverticulitis of the proximal ascending colon. -Continue IV Rocephin, IV Flagyl. -Decrease diet to full liquid diet for now. -Supportive care.  3.  Asymptomatic pyuria -Urinalysis with pyuria.Patient with -Patient denies any dysuria. -Patient with lower abdominal pain, likely abdominal pain secondary to acute diverticulitis. -Urine cultures pending. -On IV Rocephin.  4.  Hypertension -Patient noted on presentation to have systolic blood pressures in the 200s which were felt likely secondary to suboptimal pain control in the setting of chronic hypertension with improvement with systolic blood pressures after patient received some doses of IV morphine and IV fentanyl. -Continue Norvasc 10 mg daily, Coreg 6.25 mg twice daily, hydralazine 50 mg 3 times daily. -Patient also on IV Lasix. -Resume half home regimen of Imdur at 30 mg daily. -Resume home regimen spironolactone.  5.  Hyperlipidemia - Statin  6.  Anemia of chronic disease -Baseline hemoglobin 8-10. -Patient with no overt bleeding. -Hemoglobin currently stable at 9.3.  7.  CKD stage IV -Baseline creatinine 1.7-2.5. -Renal function at baseline, monitor with diuresis.  8.  Status post ORIF right ankle -Patient noted to have ORIF right ankle 04/18/2023 with revision 05/08/2023. -Patient currently in a hard cast below the knee. -Lower extremity Dopplers negative for DVT. -Continue current pain management, PT/OT.   DVT prophylaxis: Heparin Code Status: Full Family Communication: Updated patient and sister at bedside. Disposition: Likely home when clinically improved and medically stable and adequately diuresed with improvement with diverticulitis hopefully in the next 3 to 4 days.  Status is:  PROGRESS NOTE    [name]  GMW:[id number] DOB: [birth year] DOA: 05/15/2023 PCP: [name], FNP    Chief Complaint  Patient presents with   Leg Pain    Right Leg   Weakness    Brief Narrative:  Patient pleasant 62 year old female history of chronic diastolic CHF, hypertension, anemia of chronic disease with baseline hemoglobin 8-10, CKD stage IV with baseline creatinine 1.7-2.5 admitted [name], ED with acute on chronic diastolic CHF as well as acute diverticulitis.   Assessment & Plan:   Principal Problem:   Acute on chronic diastolic heart failure (HCC) Active Problems:   Essential hypertension   Hyperlipidemia   Anemia of chronic disease   Acute diverticulitis   Pyuria, sterile   Chronic kidney disease (CKD), stage IV (severe) (HCC)   #1 acute on chronic diastolic CHF -Patient presented with 3 to 4-day history of progressive shortness of breath, orthopnea, bilateral lower extremity edema. -Chest x-ray done with cardiomegaly. -BNP noted elevated at 240.7. -??  Etiology. -Cardiac enzymes flattened at 23, patient denies any ongoing chest pain. -2D echo obtained with a EF of 60 to 65%,NWMA, grade 1 diastolic dysfunction, severely dilated left atrial size, mildly dilated right atrial size.  Mild AVR. -Patient placed on Lasix 40 mg IV every 12 hours, urine output not properly recorded. -Patient unable to receive Lasix this morning due to limited venous access, no IV access at this time, seen by IV team and unable to place IV. -Will ask PCCM to see if central line can be placed as patient with CKD stage IV and unable to get a PICC line at this time. -Will give Lasix 80 mg p.o. x 1 while awaiting IV access. -Resume home regimen spironolactone. -Strict I's and O's, daily weights, IV Lasix.  2.  Acute diverticulitis -Patient on presentation with complaints of right-sided lower quadrant abdominal pain and some progressive generalized abdominal discomfort x 2 to 3  days. -CT abdomen and pelvis done on admission with acute diverticulitis of the proximal ascending colon. -Continue IV Rocephin, IV Flagyl. -Decrease diet to full liquid diet for now. -Supportive care.  3.  Asymptomatic pyuria -Urinalysis with pyuria.Patient with -Patient denies any dysuria. -Patient with lower abdominal pain, likely abdominal pain secondary to acute diverticulitis. -Urine cultures pending. -On IV Rocephin.  4.  Hypertension -Patient noted on presentation to have systolic blood pressures in the 200s which were felt likely secondary to suboptimal pain control in the setting of chronic hypertension with improvement with systolic blood pressures after patient received some doses of IV morphine and IV fentanyl. -Continue Norvasc 10 mg daily, Coreg 6.25 mg twice daily, hydralazine 50 mg 3 times daily. -Patient also on IV Lasix. -Resume half home regimen of Imdur at 30 mg daily. -Resume home regimen spironolactone.  5.  Hyperlipidemia - Statin  6.  Anemia of chronic disease -Baseline hemoglobin 8-10. -Patient with no overt bleeding. -Hemoglobin currently stable at 9.3.  7.  CKD stage IV -Baseline creatinine 1.7-2.5. -Renal function at baseline, monitor with diuresis.  8.  Status post ORIF right ankle -Patient noted to have ORIF right ankle 04/18/2023 with revision 05/08/2023. -Patient currently in a hard cast below the knee. -Lower extremity Dopplers negative for DVT. -Continue current pain management, PT/OT.   DVT prophylaxis: Heparin Code Status: Full Family Communication: Updated patient and sister at bedside. Disposition: Likely home when clinically improved and medically stable and adequately diuresed with improvement with diverticulitis hopefully in the next 3 to 4 days.  Status is:  Estimated Creatinine Clearance: 36.8 mL/min (A) (by C-G formula based on SCr of 1.94 mg/dL (H)).  Liver Function Tests: Recent Labs  Lab 05/15/23 1648 05/16/23 0524  AST 15 14*  ALT 15 14  ALKPHOS 79 72  BILITOT 1.0 0.5  PROT 8.7* 7.9  ALBUMIN 3.6 3.4*    CBG: No results for input(s): "GLUCAP" in the last 168 hours.   No results found for this or any previous visit (from the past 240 hour(s)).       Radiology Studies: VAS [place] LOWER EXTREMITY VENOUS (DVT) (7a-7p)  Result Date: 05/16/2023  Lower Venous DVT Study Patient Name:  [name]  Date of Exam:   05/15/2023 Medical Rec #: [id number]         Accession #:    [id number] Date of Birth: [birth year]         Patient Gender: F Patient Age:   62 years Exam Location:  [facility] Procedure:      VAS [place] LOWER EXTREMITY VENOUS (DVT) Referring Phys: Marja Kays DAVIS --------------------------------------------------------------------------------  Indications: Pain.  Risk Factors: Trauma. Limitations: Body habitus, poor ultrasound/tissue  interface, orthopaedic appliance and Right lower leg hard cast. Comparison Study: No prior studies. Performing Technologist: Chanda Busing RVT  Examination Guidelines: A complete evaluation includes B-mode imaging, spectral Doppler, color Doppler, and power Doppler as needed of all accessible portions of each vessel. Bilateral testing is considered an integral part of a complete examination. Limited examinations for reoccurring indications may be performed as noted. The reflux portion of the exam is performed with the patient in reverse Trendelenburg.  +---------+---------------+---------+-----------+----------+-------------------+ RIGHT    CompressibilityPhasicitySpontaneityPropertiesThrombus Aging      +---------+---------------+---------+-----------+----------+-------------------+ CFV      Full           Yes      Yes                                      +---------+---------------+---------+-----------+----------+-------------------+ SFJ      Full                                                             +---------+---------------+---------+-----------+----------+-------------------+ FV Prox  Full                                                             +---------+---------------+---------+-----------+----------+-------------------+ FV Mid   Full                                                             +---------+---------------+---------+-----------+----------+-------------------+ FV DistalFull                                                             +---------+---------------+---------+-----------+----------+-------------------+  Estimated Creatinine Clearance: 36.8 mL/min (A) (by C-G formula based on SCr of 1.94 mg/dL (H)).  Liver Function Tests: Recent Labs  Lab 05/15/23 1648 05/16/23 0524  AST 15 14*  ALT 15 14  ALKPHOS 79 72  BILITOT 1.0 0.5  PROT 8.7* 7.9  ALBUMIN 3.6 3.4*    CBG: No results for input(s): "GLUCAP" in the last 168 hours.   No results found for this or any previous visit (from the past 240 hour(s)).       Radiology Studies: VAS [place] LOWER EXTREMITY VENOUS (DVT) (7a-7p)  Result Date: 05/16/2023  Lower Venous DVT Study Patient Name:  [name]  Date of Exam:   05/15/2023 Medical Rec #: [id number]         Accession #:    [id number] Date of Birth: [birth year]         Patient Gender: F Patient Age:   62 years Exam Location:  [facility] Procedure:      VAS [place] LOWER EXTREMITY VENOUS (DVT) Referring Phys: Marja Kays DAVIS --------------------------------------------------------------------------------  Indications: Pain.  Risk Factors: Trauma. Limitations: Body habitus, poor ultrasound/tissue  interface, orthopaedic appliance and Right lower leg hard cast. Comparison Study: No prior studies. Performing Technologist: Chanda Busing RVT  Examination Guidelines: A complete evaluation includes B-mode imaging, spectral Doppler, color Doppler, and power Doppler as needed of all accessible portions of each vessel. Bilateral testing is considered an integral part of a complete examination. Limited examinations for reoccurring indications may be performed as noted. The reflux portion of the exam is performed with the patient in reverse Trendelenburg.  +---------+---------------+---------+-----------+----------+-------------------+ RIGHT    CompressibilityPhasicitySpontaneityPropertiesThrombus Aging      +---------+---------------+---------+-----------+----------+-------------------+ CFV      Full           Yes      Yes                                      +---------+---------------+---------+-----------+----------+-------------------+ SFJ      Full                                                             +---------+---------------+---------+-----------+----------+-------------------+ FV Prox  Full                                                             +---------+---------------+---------+-----------+----------+-------------------+ FV Mid   Full                                                             +---------+---------------+---------+-----------+----------+-------------------+ FV DistalFull                                                             +---------+---------------+---------+-----------+----------+-------------------+  Estimated Creatinine Clearance: 36.8 mL/min (A) (by C-G formula based on SCr of 1.94 mg/dL (H)).  Liver Function Tests: Recent Labs  Lab 05/15/23 1648 05/16/23 0524  AST 15 14*  ALT 15 14  ALKPHOS 79 72  BILITOT 1.0 0.5  PROT 8.7* 7.9  ALBUMIN 3.6 3.4*    CBG: No results for input(s): "GLUCAP" in the last 168 hours.   No results found for this or any previous visit (from the past 240 hour(s)).       Radiology Studies: VAS [place] LOWER EXTREMITY VENOUS (DVT) (7a-7p)  Result Date: 05/16/2023  Lower Venous DVT Study Patient Name:  [name]  Date of Exam:   05/15/2023 Medical Rec #: [id number]         Accession #:    [id number] Date of Birth: [birth year]         Patient Gender: F Patient Age:   62 years Exam Location:  [facility] Procedure:      VAS [place] LOWER EXTREMITY VENOUS (DVT) Referring Phys: Marja Kays DAVIS --------------------------------------------------------------------------------  Indications: Pain.  Risk Factors: Trauma. Limitations: Body habitus, poor ultrasound/tissue  interface, orthopaedic appliance and Right lower leg hard cast. Comparison Study: No prior studies. Performing Technologist: Chanda Busing RVT  Examination Guidelines: A complete evaluation includes B-mode imaging, spectral Doppler, color Doppler, and power Doppler as needed of all accessible portions of each vessel. Bilateral testing is considered an integral part of a complete examination. Limited examinations for reoccurring indications may be performed as noted. The reflux portion of the exam is performed with the patient in reverse Trendelenburg.  +---------+---------------+---------+-----------+----------+-------------------+ RIGHT    CompressibilityPhasicitySpontaneityPropertiesThrombus Aging      +---------+---------------+---------+-----------+----------+-------------------+ CFV      Full           Yes      Yes                                      +---------+---------------+---------+-----------+----------+-------------------+ SFJ      Full                                                             +---------+---------------+---------+-----------+----------+-------------------+ FV Prox  Full                                                             +---------+---------------+---------+-----------+----------+-------------------+ FV Mid   Full                                                             +---------+---------------+---------+-----------+----------+-------------------+ FV DistalFull                                                             +---------+---------------+---------+-----------+----------+-------------------+

## 2023-05-16 NOTE — Evaluation (Signed)
Physical Therapy Evaluation Patient Details Name: [name] MRN: [id number] DOB: [birth year] Today's Date: 05/16/2023  History of Present Illness  Pt is a 62 y/o F admitted on 05/15/23 for acute on chronic diastolic heart failure after presenting from home with c/o SOB x 3-4 days. Pt also found to have acute diverticulitis. Pt with recent R ankle ORIF on 04/18/23 & revision on 05/08/23. PMH: chronic diastolic heart failure, essential HTN, anemia of chronic diseases, CKD 4  Clinical Impression  Pt seen for PT evaluation with pt agreeable. Pt reports since ankle sx she has been staying with either her daughter or sister in a level entry home, transferring to w/c & using w/c for mobility except hopping into bathroom by holding to furniture. Pt expresses dislike/fear of RW. Pt is able to transfer to standing, bed>recliner without AD. Pt does rest R foot on floor but denies weight bearing through extremity (question pt's ability to maintain NWB during stand pivot).  Pt politely declines practicing with RW this AM. Will continue to follow pt acutely to address balance, safety with mobility, gait.      If plan is discharge home, recommend the following: A little help with walking and/or transfers;A little help with bathing/dressing/bathroom;Assistance with cooking/housework;Assist for transportation;Help with stairs or ramp for entrance   Can travel by private vehicle        Equipment Recommendations None recommended by PT  Recommendations for Other Services       Functional Status Assessment Patient has had a recent decline in their functional status and demonstrates the ability to make significant improvements in function in a reasonable and predictable amount of time.     Precautions / Restrictions Precautions Precautions: Fall Restrictions Weight Bearing Restrictions: Yes RLE Weight Bearing: Non weight bearing      Mobility  Bed Mobility Overal bed mobility: Modified  Independent Bed Mobility: Supine to Sit     Supine to sit: Modified independent (Device/Increase time), HOB elevated, Used rails          Transfers Overall transfer level: Needs assistance Equipment used: None Transfers: Sit to/from Stand, Bed to chair/wheelchair/BSC Sit to Stand: Modified independent (Device/Increase time) Stand pivot transfers: Supervision         General transfer comment: Pt able to transfer STS from EOB, resting R foot on floor but reports not putting any weight through it. Pt transfers bed>recliner on L via stand pivot, electing to transfer without AD (pt reports she does not like/is fearful of RW) but question pt's ability to maintain strict NWB RLE during transfer.    Ambulation/Gait Ambulation/Gait assistance:  (Pt politely decliens standing/gait attempts with RW, noting she does not like it.)                Stairs            Wheelchair Mobility     Tilt Bed    Modified Rankin (Stroke Patients Only)       Balance Overall balance assessment: Needs assistance   Sitting balance-Leahy Scale: Good     Standing balance support: No upper extremity supported, During functional activity Standing balance-Leahy Scale: Fair Standing balance comment: Pt able to stand to perform peri hygiene with 1 UE support & supervision, no LOB.                             Pertinent Vitals/Pain Pain Assessment Pain Assessment: Faces Faces Pain Scale: Hurts little more Pain Location: RLQ  stomach/abdomen Pain Descriptors / Indicators: Discomfort Pain Intervention(s): Monitored during session    Home Living Family/patient expects to be discharged to:: Private residence Living Arrangements: Children;Other relatives Available Help at Discharge: Family Type of Home: Apartment Home Access: Level entry       Home Layout: One level Home Equipment: Wheelchair - Forensic psychologist (2 wheels);Crutches Additional Comments: Pt has been  staying with either sister or daughter, has 24 hr supervision, staying in level entry home. Pt has been using w/c since ankle sx or hopping into bathroom by holding on to furniture. Pt reports HHPT hasn't started yet 2/2 pt having 2nd sx recently.    Prior Function               Mobility Comments: Prior to ankle fx pt was independent without AD, driving. Pt has recently been using w/c for mobility or hopping into bathroom by holding onto furniture.       Extremity/Trunk Assessment   Upper Extremity Assessment Upper Extremity Assessment: Overall WFL for tasks assessed    Lower Extremity Assessment Lower Extremity Assessment: Overall WFL for tasks assessed (RLE NWB)    Cervical / Trunk Assessment Cervical / Trunk Assessment: Normal  Communication      Cognition Arousal: Alert Behavior During Therapy: WFL for tasks assessed/performed Overall Cognitive Status: Within Functional Limits for tasks assessed                                          General Comments General comments (skin integrity, edema, etc.): Pt with incontinent small BM, pt unaware. Pt performs peri hygiene with set up assist.    Exercises     Assessment/Plan    PT Assessment Patient needs continued PT services  PT Problem List Decreased strength;Decreased activity tolerance;Decreased balance;Decreased mobility;Decreased knowledge of use of DME;Decreased safety awareness;Decreased knowledge of precautions       PT Treatment Interventions DME instruction;Balance training;Modalities;Gait training;Neuromuscular re-education;Stair training;Functional mobility training;Therapeutic activities;Therapeutic exercise;Manual techniques;Wheelchair mobility training;Patient/family education    PT Goals (Current goals can be found in the Care Plan section)  Acute Rehab PT Goals Patient Stated Goal: get better PT Goal Formulation: With patient Time For Goal Achievement: 05/30/23 Potential to Achieve  Goals: Good    Frequency Min 1X/week     Co-evaluation               AM-PAC PT "6 Clicks" Mobility  Outcome Measure Help needed turning from your back to your side while in a flat bed without using bedrails?: None Help needed moving from lying on your back to sitting on the side of a flat bed without using bedrails?: None Help needed moving to and from a bed to a chair (including a wheelchair)?: A Little Help needed standing up from a chair using your arms (e.g., wheelchair or bedside chair)?: None Help needed to walk in hospital room?: A Lot Help needed climbing 3-5 steps with a railing? : Total 6 Click Score: 18    End of Session   Activity Tolerance: Patient tolerated treatment well Patient left: in chair;with chair alarm set;with call bell/phone within reach;with family/visitor present   PT Visit Diagnosis: Muscle weakness (generalized) (M62.81);Other abnormalities of gait and mobility (R26.89)    Time: 1610-9604 PT Time Calculation (min) (ACUTE ONLY): 9 min   Charges:   PT Evaluation $PT Eval Low Complexity: 1 Low   PT General Charges $$ ACUTE  PT VISIT: 1 Visit         Aleda Grana, PT, DPT 05/16/23, 11:00 AM   Sandi Mariscal 05/16/2023, 10:58 AM

## 2023-05-17 DIAGNOSIS — N184 Chronic kidney disease, stage 4 (severe): Secondary | ICD-10-CM | POA: Diagnosis not present

## 2023-05-17 DIAGNOSIS — K5792 Diverticulitis of intestine, part unspecified, without perforation or abscess without bleeding: Secondary | ICD-10-CM | POA: Diagnosis not present

## 2023-05-17 DIAGNOSIS — I5033 Acute on chronic diastolic (congestive) heart failure: Secondary | ICD-10-CM | POA: Diagnosis not present

## 2023-05-17 DIAGNOSIS — D638 Anemia in other chronic diseases classified elsewhere: Secondary | ICD-10-CM | POA: Diagnosis not present

## 2023-05-17 LAB — RENAL FUNCTION PANEL
Albumin: 3.1 g/dL — ABNORMAL LOW (ref 3.5–5.0)
Anion gap: 10 (ref 5–15)
BUN: 31 mg/dL — ABNORMAL HIGH (ref 8–23)
CO2: 25 mmol/L (ref 22–32)
Calcium: 8.6 mg/dL — ABNORMAL LOW (ref 8.9–10.3)
Chloride: 101 mmol/L (ref 98–111)
Creatinine, Ser: 2.05 mg/dL — ABNORMAL HIGH (ref 0.44–1.00)
GFR, Estimated: 27 mL/min — ABNORMAL LOW (ref >60)
Glucose, Bld: 97 mg/dL (ref 70–99)
Phosphorus: 4.3 mg/dL (ref 2.5–4.6)
Potassium: 3.5 mmol/L (ref 3.5–5.1)
Sodium: 136 mmol/L (ref 135–145)

## 2023-05-17 LAB — CBC
HCT: 30.3 % — ABNORMAL LOW (ref 36.0–46.0)
Hemoglobin: 8.7 g/dL — ABNORMAL LOW (ref 12.0–15.0)
MCH: 25.9 pg — ABNORMAL LOW (ref 26.0–34.0)
MCHC: 28.7 g/dL — ABNORMAL LOW (ref 30.0–36.0)
MCV: 90.2 fL (ref 80.0–100.0)
Platelets: 340 10*3/uL (ref 150–400)
RBC: 3.36 MIL/uL — ABNORMAL LOW (ref 3.87–5.11)
RDW: 15.9 % — ABNORMAL HIGH (ref 11.5–15.5)
WBC: 9.6 10*3/uL (ref 4.0–10.5)
nRBC: 0 % (ref 0.0–0.2)

## 2023-05-17 LAB — URINE CULTURE: Culture: 30000 — AB

## 2023-05-17 MED ORDER — ISOSORBIDE MONONITRATE ER 60 MG PO TB24
60.0000 mg | ORAL_TABLET | Freq: Every day | ORAL | Status: DC
  Administered 2023-05-18 – 2023-05-20 (×3): 60 mg via ORAL
  Filled 2023-05-17 (×3): qty 1

## 2023-05-17 NOTE — Progress Notes (Signed)
of Birth: 26-Feb-1961         Patient Gender: F Patient Age:   62 years Exam Location:  [facility] Procedure:      VAS [place] LOWER EXTREMITY VENOUS (DVT) Referring Phys: Marja Kays DAVIS --------------------------------------------------------------------------------  Indications: Pain.  Risk Factors: Trauma. Limitations: Body habitus, poor ultrasound/tissue interface, orthopaedic appliance and Right lower leg hard cast. Comparison Study: No prior studies. Performing Technologist: Chanda Busing RVT  Examination Guidelines: A complete evaluation includes B-mode imaging, spectral Doppler, color Doppler, and power Doppler as needed of all accessible portions of each vessel. Bilateral testing is considered an integral part of a complete examination. Limited examinations for reoccurring indications may be performed as noted. The reflux portion of the exam is performed with the patient in reverse Trendelenburg.  +---------+---------------+---------+-----------+----------+-------------------+ RIGHT    CompressibilityPhasicitySpontaneityPropertiesThrombus Aging       +---------+---------------+---------+-----------+----------+-------------------+ CFV      Full           Yes      Yes                                      +---------+---------------+---------+-----------+----------+-------------------+ SFJ      Full                                                             +---------+---------------+---------+-----------+----------+-------------------+ FV Prox  Full                                                             +---------+---------------+---------+-----------+----------+-------------------+ FV Mid   Full                                                             +---------+---------------+---------+-----------+----------+-------------------+ FV DistalFull                                                             +---------+---------------+---------+-----------+----------+-------------------+ PFV      Full                                                             +---------+---------------+---------+-----------+----------+-------------------+ POP      Full           Yes      Yes                                      +---------+---------------+---------+-----------+----------+-------------------+  3.5  CL 102 103 101  CO2 22 24 25   GLUCOSE 104* 107* 97  BUN 28* 29* 31*  CREATININE 1.83* 1.94* 2.05*  CALCIUM 9.0 8.7* 8.6*  MG 2.3 2.4  --   PHOS  --   --  4.3    GFR: Estimated Creatinine Clearance: 35 mL/min (A) (by C-G formula based on SCr of 2.05 mg/dL (H)).  Liver Function Tests: Recent Labs  Lab 05/15/23 1648 05/16/23 0524 05/17/23 0442  AST 15 14*  --   ALT 15 14  --   ALKPHOS 79 72  --   BILITOT 1.0 0.5  --   PROT 8.7* 7.9  --   ALBUMIN 3.6 3.4* 3.1*    CBG: No results for input(s): "GLUCAP" in the last 168 hours.   No results found for this or any previous visit (from the past 240 hour(s)).       Radiology Studies: ECHOCARDIOGRAM COMPLETE  Result Date: 05/16/2023    ECHOCARDIOGRAM REPORT   Patient Name:   [name] Date of Exam: 05/16/2023 Medical Rec #:  [id number]        Height:       68.0 in Accession #:    [id number]       Weight:        216.0 lb Date of Birth:  1961-06-10        BSA:          2.112 m Patient Age:    62 years         BP:           179/74 mmHg Patient Gender: F                HR:           74 bpm. Exam Location:  Inpatient Procedure: 2D Echo, Color Doppler and Cardiac Doppler Indications:    CHF-Acute Diastolic I50.31  History:        Patient has prior history of Echocardiogram examinations, most                 recent 09/06/2018. CHF; Risk Factors:Hypertension and                 Dyslipidemia.  Sonographer:    Harriette Bouillon RDCS Referring Phys: [id number] JUSTIN B HOWERTER IMPRESSIONS  1. Left ventricular ejection fraction, by estimation, is 60 to 65%. The left ventricle has normal function. The left ventricle has no regional wall motion abnormalities. There is severe concentric left ventricular hypertrophy. Left ventricular diastolic  parameters are consistent with Grade I diastolic dysfunction (impaired relaxation). Elevated left ventricular end-diastolic pressure.  2. Right ventricular systolic function is normal. The right ventricular size is normal. Tricuspid regurgitation signal is inadequate for assessing PA pressure.  3. Left atrial size was severely dilated.  4. Right atrial size was mildly dilated.  5. The mitral valve is normal in structure. No evidence of mitral valve regurgitation. No evidence of mitral stenosis.  6. The aortic valve is tricuspid. Aortic valve regurgitation is mild. Aortic valve sclerosis/calcification is present, without any evidence of aortic stenosis.  7. The inferior vena cava is normal in size with greater than 50% respiratory variability, suggesting right atrial pressure of 3 mmHg. FINDINGS  Left Ventricle: Left ventricular ejection fraction, by estimation, is 60 to 65%. The left ventricle has normal function. The left ventricle has no regional wall motion abnormalities. The left ventricular internal cavity size was normal in size. There is  severe concentric  of Birth: 26-Feb-1961         Patient Gender: F Patient Age:   62 years Exam Location:  [facility] Procedure:      VAS [place] LOWER EXTREMITY VENOUS (DVT) Referring Phys: Marja Kays DAVIS --------------------------------------------------------------------------------  Indications: Pain.  Risk Factors: Trauma. Limitations: Body habitus, poor ultrasound/tissue interface, orthopaedic appliance and Right lower leg hard cast. Comparison Study: No prior studies. Performing Technologist: Chanda Busing RVT  Examination Guidelines: A complete evaluation includes B-mode imaging, spectral Doppler, color Doppler, and power Doppler as needed of all accessible portions of each vessel. Bilateral testing is considered an integral part of a complete examination. Limited examinations for reoccurring indications may be performed as noted. The reflux portion of the exam is performed with the patient in reverse Trendelenburg.  +---------+---------------+---------+-----------+----------+-------------------+ RIGHT    CompressibilityPhasicitySpontaneityPropertiesThrombus Aging       +---------+---------------+---------+-----------+----------+-------------------+ CFV      Full           Yes      Yes                                      +---------+---------------+---------+-----------+----------+-------------------+ SFJ      Full                                                             +---------+---------------+---------+-----------+----------+-------------------+ FV Prox  Full                                                             +---------+---------------+---------+-----------+----------+-------------------+ FV Mid   Full                                                             +---------+---------------+---------+-----------+----------+-------------------+ FV DistalFull                                                             +---------+---------------+---------+-----------+----------+-------------------+ PFV      Full                                                             +---------+---------------+---------+-----------+----------+-------------------+ POP      Full           Yes      Yes                                      +---------+---------------+---------+-----------+----------+-------------------+  of Birth: 26-Feb-1961         Patient Gender: F Patient Age:   62 years Exam Location:  [facility] Procedure:      VAS [place] LOWER EXTREMITY VENOUS (DVT) Referring Phys: Marja Kays DAVIS --------------------------------------------------------------------------------  Indications: Pain.  Risk Factors: Trauma. Limitations: Body habitus, poor ultrasound/tissue interface, orthopaedic appliance and Right lower leg hard cast. Comparison Study: No prior studies. Performing Technologist: Chanda Busing RVT  Examination Guidelines: A complete evaluation includes B-mode imaging, spectral Doppler, color Doppler, and power Doppler as needed of all accessible portions of each vessel. Bilateral testing is considered an integral part of a complete examination. Limited examinations for reoccurring indications may be performed as noted. The reflux portion of the exam is performed with the patient in reverse Trendelenburg.  +---------+---------------+---------+-----------+----------+-------------------+ RIGHT    CompressibilityPhasicitySpontaneityPropertiesThrombus Aging       +---------+---------------+---------+-----------+----------+-------------------+ CFV      Full           Yes      Yes                                      +---------+---------------+---------+-----------+----------+-------------------+ SFJ      Full                                                             +---------+---------------+---------+-----------+----------+-------------------+ FV Prox  Full                                                             +---------+---------------+---------+-----------+----------+-------------------+ FV Mid   Full                                                             +---------+---------------+---------+-----------+----------+-------------------+ FV DistalFull                                                             +---------+---------------+---------+-----------+----------+-------------------+ PFV      Full                                                             +---------+---------------+---------+-----------+----------+-------------------+ POP      Full           Yes      Yes                                      +---------+---------------+---------+-----------+----------+-------------------+  of Birth: 26-Feb-1961         Patient Gender: F Patient Age:   62 years Exam Location:  [facility] Procedure:      VAS [place] LOWER EXTREMITY VENOUS (DVT) Referring Phys: Marja Kays DAVIS --------------------------------------------------------------------------------  Indications: Pain.  Risk Factors: Trauma. Limitations: Body habitus, poor ultrasound/tissue interface, orthopaedic appliance and Right lower leg hard cast. Comparison Study: No prior studies. Performing Technologist: Chanda Busing RVT  Examination Guidelines: A complete evaluation includes B-mode imaging, spectral Doppler, color Doppler, and power Doppler as needed of all accessible portions of each vessel. Bilateral testing is considered an integral part of a complete examination. Limited examinations for reoccurring indications may be performed as noted. The reflux portion of the exam is performed with the patient in reverse Trendelenburg.  +---------+---------------+---------+-----------+----------+-------------------+ RIGHT    CompressibilityPhasicitySpontaneityPropertiesThrombus Aging       +---------+---------------+---------+-----------+----------+-------------------+ CFV      Full           Yes      Yes                                      +---------+---------------+---------+-----------+----------+-------------------+ SFJ      Full                                                             +---------+---------------+---------+-----------+----------+-------------------+ FV Prox  Full                                                             +---------+---------------+---------+-----------+----------+-------------------+ FV Mid   Full                                                             +---------+---------------+---------+-----------+----------+-------------------+ FV DistalFull                                                             +---------+---------------+---------+-----------+----------+-------------------+ PFV      Full                                                             +---------+---------------+---------+-----------+----------+-------------------+ POP      Full           Yes      Yes                                      +---------+---------------+---------+-----------+----------+-------------------+  of Birth: 26-Feb-1961         Patient Gender: F Patient Age:   62 years Exam Location:  [facility] Procedure:      VAS [place] LOWER EXTREMITY VENOUS (DVT) Referring Phys: Marja Kays DAVIS --------------------------------------------------------------------------------  Indications: Pain.  Risk Factors: Trauma. Limitations: Body habitus, poor ultrasound/tissue interface, orthopaedic appliance and Right lower leg hard cast. Comparison Study: No prior studies. Performing Technologist: Chanda Busing RVT  Examination Guidelines: A complete evaluation includes B-mode imaging, spectral Doppler, color Doppler, and power Doppler as needed of all accessible portions of each vessel. Bilateral testing is considered an integral part of a complete examination. Limited examinations for reoccurring indications may be performed as noted. The reflux portion of the exam is performed with the patient in reverse Trendelenburg.  +---------+---------------+---------+-----------+----------+-------------------+ RIGHT    CompressibilityPhasicitySpontaneityPropertiesThrombus Aging       +---------+---------------+---------+-----------+----------+-------------------+ CFV      Full           Yes      Yes                                      +---------+---------------+---------+-----------+----------+-------------------+ SFJ      Full                                                             +---------+---------------+---------+-----------+----------+-------------------+ FV Prox  Full                                                             +---------+---------------+---------+-----------+----------+-------------------+ FV Mid   Full                                                             +---------+---------------+---------+-----------+----------+-------------------+ FV DistalFull                                                             +---------+---------------+---------+-----------+----------+-------------------+ PFV      Full                                                             +---------+---------------+---------+-----------+----------+-------------------+ POP      Full           Yes      Yes                                      +---------+---------------+---------+-----------+----------+-------------------+  of Birth: 26-Feb-1961         Patient Gender: F Patient Age:   62 years Exam Location:  [facility] Procedure:      VAS [place] LOWER EXTREMITY VENOUS (DVT) Referring Phys: Marja Kays DAVIS --------------------------------------------------------------------------------  Indications: Pain.  Risk Factors: Trauma. Limitations: Body habitus, poor ultrasound/tissue interface, orthopaedic appliance and Right lower leg hard cast. Comparison Study: No prior studies. Performing Technologist: Chanda Busing RVT  Examination Guidelines: A complete evaluation includes B-mode imaging, spectral Doppler, color Doppler, and power Doppler as needed of all accessible portions of each vessel. Bilateral testing is considered an integral part of a complete examination. Limited examinations for reoccurring indications may be performed as noted. The reflux portion of the exam is performed with the patient in reverse Trendelenburg.  +---------+---------------+---------+-----------+----------+-------------------+ RIGHT    CompressibilityPhasicitySpontaneityPropertiesThrombus Aging       +---------+---------------+---------+-----------+----------+-------------------+ CFV      Full           Yes      Yes                                      +---------+---------------+---------+-----------+----------+-------------------+ SFJ      Full                                                             +---------+---------------+---------+-----------+----------+-------------------+ FV Prox  Full                                                             +---------+---------------+---------+-----------+----------+-------------------+ FV Mid   Full                                                             +---------+---------------+---------+-----------+----------+-------------------+ FV DistalFull                                                             +---------+---------------+---------+-----------+----------+-------------------+ PFV      Full                                                             +---------+---------------+---------+-----------+----------+-------------------+ POP      Full           Yes      Yes                                      +---------+---------------+---------+-----------+----------+-------------------+  of Birth: 26-Feb-1961         Patient Gender: F Patient Age:   62 years Exam Location:  [facility] Procedure:      VAS [place] LOWER EXTREMITY VENOUS (DVT) Referring Phys: Marja Kays DAVIS --------------------------------------------------------------------------------  Indications: Pain.  Risk Factors: Trauma. Limitations: Body habitus, poor ultrasound/tissue interface, orthopaedic appliance and Right lower leg hard cast. Comparison Study: No prior studies. Performing Technologist: Chanda Busing RVT  Examination Guidelines: A complete evaluation includes B-mode imaging, spectral Doppler, color Doppler, and power Doppler as needed of all accessible portions of each vessel. Bilateral testing is considered an integral part of a complete examination. Limited examinations for reoccurring indications may be performed as noted. The reflux portion of the exam is performed with the patient in reverse Trendelenburg.  +---------+---------------+---------+-----------+----------+-------------------+ RIGHT    CompressibilityPhasicitySpontaneityPropertiesThrombus Aging       +---------+---------------+---------+-----------+----------+-------------------+ CFV      Full           Yes      Yes                                      +---------+---------------+---------+-----------+----------+-------------------+ SFJ      Full                                                             +---------+---------------+---------+-----------+----------+-------------------+ FV Prox  Full                                                             +---------+---------------+---------+-----------+----------+-------------------+ FV Mid   Full                                                             +---------+---------------+---------+-----------+----------+-------------------+ FV DistalFull                                                             +---------+---------------+---------+-----------+----------+-------------------+ PFV      Full                                                             +---------+---------------+---------+-----------+----------+-------------------+ POP      Full           Yes      Yes                                      +---------+---------------+---------+-----------+----------+-------------------+

## 2023-05-17 NOTE — Evaluation (Signed)
Occupational Therapy Evaluation Patient Details Name: [name] MRN: [id number] DOB: 10/19/1960 Today's Date: 05/17/2023   History of Present Illness Pt is a 62 y/o F admitted on 05/15/23 for acute on chronic diastolic heart failure after presenting from home with c/o SOB x 3-4 days. Pt also found to have acute diverticulitis. Pt with recent R ankle ORIF on 04/18/23 & revision on 05/08/23. PMH: chronic diastolic heart failure, essential HTN, anemia of chronic diseases, CKD 4   Clinical Impression   Pt admitted with the above. Pt currently with functional limitations due to the deficits listed below (see OT Problem List).  Pt will benefit from acute skilled OT to increase their safety and independence with ADL and functional mobility for ADL to facilitate discharge. Pts family very supportive and will A As needed        If plan is discharge home, recommend the following: A little help with walking and/or transfers;A little help with bathing/dressing/bathroom    Functional Status Assessment  Patient has had a recent decline in their functional status and demonstrates the ability to make significant improvements in function in a reasonable and predictable amount of time.  Equipment Recommendations  BSC/3in1           Mobility Bed Mobility Overal bed mobility: Modified Independent Bed Mobility: Supine to Sit     Supine to sit: Modified independent (Device/Increase time), HOB elevated, Used rails          Transfers Overall transfer level: Needs assistance Equipment used: None Transfers: Sit to/from Stand, Bed to chair/wheelchair/BSC Sit to Stand: Contact guard assist Stand pivot transfers: Supervision                Balance Overall balance assessment: Needs assistance   Sitting balance-Leahy Scale: Good     Standing balance support: No upper extremity supported, During functional activity Standing balance-Leahy Scale: Fair Standing balance comment: Pt able to  stand to perform peri hygiene with 1 UE support & supervision, no LOB.                           ADL either performed or assessed with clinical judgement   ADL Overall ADL's : Needs assistance/impaired Eating/Feeding: Modified independent   Grooming: Wash/dry face;Set up   Upper Body Bathing: Set up;Sitting   Lower Body Bathing: Sit to/from stand;Minimal assistance   Upper Body Dressing : Set up;Sitting   Lower Body Dressing: Minimal assistance;Sit to/from stand   Toilet Transfer: Minimal assistance;Stand-pivot;Rolling walker (2 wheels)   Toileting- Clothing Manipulation and Hygiene: Minimal assistance;Sit to/from stand;Cueing for safety;Cueing for sequencing         General ADL Comments: family able to A as needed     Vision Patient Visual Report: No change from baseline              Pertinent Vitals/Pain Pain Assessment Pain Score: 4  Pain Descriptors / Indicators: Discomfort Pain Intervention(s): Limited activity within patient's tolerance, Repositioned     Extremity/Trunk Assessment         Cervical / Trunk Assessment Cervical / Trunk Assessment: Normal   Communication Communication Communication: No apparent difficulties   Cognition Arousal: Alert Behavior During Therapy: WFL for tasks assessed/performed Overall Cognitive Status: Within Functional Limits for tasks assessed  Home Living Family/patient expects to be discharged to:: Private residence Living Arrangements: Children;Other relatives Available Help at Discharge: Family Type of Home: Apartment Home Access: Level entry     Home Layout: One level               Home Equipment: Wheelchair - Forensic psychologist (2 wheels);Crutches   Additional Comments: Pt has been staying with either sister or daughter, has 24 hr supervision, staying in level entry home. Pt has been using w/c since ankle sx or hopping  into bathroom by holding on to furniture. Pt reports HHPT hasn't started yet 2/2 pt having 2nd sx recently.      Prior Functioning/Environment               Mobility Comments: Prior to ankle fx pt was independent without AD, driving. Pt has recently been using w/c for mobility or hopping into bathroom by holding onto furniture.          OT Problem List: Pain;Decreased safety awareness      OT Treatment/Interventions: Self-care/ADL training;Therapeutic activities;DME and/or AE instruction    OT Goals(Current goals can be found in the care plan section) Acute Rehab OT Goals Patient Stated Goal: get home and get well OT Goal Formulation: With patient Time For Goal Achievement: 05/30/23 Potential to Achieve Goals: Good ADL Goals Pt Will Perform Lower Body Bathing: (P) with modified independence;sit to/from stand;with min assist Pt Will Perform Lower Body Dressing: (P) with modified independence Pt Will Transfer to Toilet: (P) with modified independence Pt Will Perform Toileting - Clothing Manipulation and hygiene: (P) with modified independence Pt Will Perform Tub/Shower Transfer: (P) with modified independence  OT Frequency: Min 2X/week       AM-PAC OT "6 Clicks" Daily Activity     Outcome Measure Help from another person eating meals?: None Help from another person taking care of personal grooming?: None Help from another person toileting, which includes using toliet, bedpan, or urinal?: A Little Help from another person bathing (including washing, rinsing, drying)?: A Little Help from another person to put on and taking off regular upper body clothing?: None Help from another person to put on and taking off regular lower body clothing?: A Little 6 Click Score: 21   End of Session Equipment Utilized During Treatment: Rolling walker (2 wheels) Nurse Communication: Mobility status  Activity Tolerance: Patient limited by pain Patient left: in chair;with call bell/phone  within reach  OT Visit Diagnosis: Unsteadiness on feet (R26.81);Muscle weakness (generalized) (M62.81)                Time: 4696-2952 OT Time Calculation (min): 14 min Charges:  OT General Charges $OT Visit: 1 Visit OT Evaluation $OT Eval Low Complexity: 1 Low  Lise Auer, OT Acute Rehabilitation Services  [phone], Karin Golden D 05/17/2023, 2:28 PM

## 2023-05-18 ENCOUNTER — Ambulatory Visit (HOSPITAL_BASED_OUTPATIENT_CLINIC_OR_DEPARTMENT_OTHER): Payer: 59 | Admitting: Family

## 2023-05-18 DIAGNOSIS — I5033 Acute on chronic diastolic (congestive) heart failure: Secondary | ICD-10-CM | POA: Diagnosis not present

## 2023-05-18 DIAGNOSIS — N184 Chronic kidney disease, stage 4 (severe): Secondary | ICD-10-CM | POA: Diagnosis not present

## 2023-05-18 DIAGNOSIS — K5792 Diverticulitis of intestine, part unspecified, without perforation or abscess without bleeding: Secondary | ICD-10-CM | POA: Diagnosis not present

## 2023-05-18 DIAGNOSIS — D638 Anemia in other chronic diseases classified elsewhere: Secondary | ICD-10-CM | POA: Diagnosis not present

## 2023-05-18 LAB — BASIC METABOLIC PANEL
Anion gap: 12 (ref 5–15)
BUN: 31 mg/dL — ABNORMAL HIGH (ref 8–23)
CO2: 21 mmol/L — ABNORMAL LOW (ref 22–32)
Calcium: 8.1 mg/dL — ABNORMAL LOW (ref 8.9–10.3)
Chloride: 99 mmol/L (ref 98–111)
Creatinine, Ser: 2.17 mg/dL — ABNORMAL HIGH (ref 0.44–1.00)
GFR, Estimated: 25 mL/min — ABNORMAL LOW (ref >60)
Glucose, Bld: 95 mg/dL (ref 70–99)
Potassium: 3.4 mmol/L — ABNORMAL LOW (ref 3.5–5.1)
Sodium: 132 mmol/L — ABNORMAL LOW (ref 135–145)

## 2023-05-18 LAB — CBC
HCT: 28.5 % — ABNORMAL LOW (ref 36.0–46.0)
Hemoglobin: 8.2 g/dL — ABNORMAL LOW (ref 12.0–15.0)
MCH: 25.5 pg — ABNORMAL LOW (ref 26.0–34.0)
MCHC: 28.8 g/dL — ABNORMAL LOW (ref 30.0–36.0)
MCV: 88.5 fL (ref 80.0–100.0)
Platelets: 297 10*3/uL (ref 150–400)
RBC: 3.22 MIL/uL — ABNORMAL LOW (ref 3.87–5.11)
RDW: 15.7 % — ABNORMAL HIGH (ref 11.5–15.5)
WBC: 9.7 10*3/uL (ref 4.0–10.5)
nRBC: 0 % (ref 0.0–0.2)

## 2023-05-18 LAB — URINE CULTURE

## 2023-05-18 MED ORDER — LOPERAMIDE HCL 2 MG PO CAPS
2.0000 mg | ORAL_CAPSULE | ORAL | Status: DC | PRN
  Administered 2023-05-18 – 2023-05-20 (×3): 2 mg via ORAL
  Filled 2023-05-18 (×3): qty 1

## 2023-05-18 MED ORDER — AMOXICILLIN-POT CLAVULANATE 875-125 MG PO TABS
1.0000 | ORAL_TABLET | Freq: Two times a day (BID) | ORAL | Status: DC
  Administered 2023-05-18 – 2023-05-20 (×5): 1 via ORAL
  Filled 2023-05-18 (×6): qty 1

## 2023-05-18 MED ORDER — POTASSIUM CHLORIDE CRYS ER 10 MEQ PO TBCR
20.0000 meq | EXTENDED_RELEASE_TABLET | Freq: Once | ORAL | Status: AC
  Administered 2023-05-18: 20 meq via ORAL
  Filled 2023-05-18: qty 2

## 2023-05-18 MED ORDER — FUROSEMIDE 40 MG PO TABS: 40.0000 mg | ORAL_TABLET | Freq: Every day | ORAL | Status: DC

## 2023-05-18 MED ORDER — LOPERAMIDE HCL 2 MG PO CAPS
2.0000 mg | ORAL_CAPSULE | Freq: Once | ORAL | Status: AC
  Administered 2023-05-18: 2 mg via ORAL
  Filled 2023-05-18: qty 1

## 2023-05-18 NOTE — Care Management Important Message (Signed)
Important Message  Patient Details  Name: [name] MRN: [id number] Date of Birth: 17-Jul-1961   Important Message Given:  Yes - Medicare IM     Mardene Sayer 05/18/2023, 12:50 PM

## 2023-05-18 NOTE — Plan of Care (Signed)
  Problem: Pain Managment: Goal: General experience of comfort will improve Outcome: Progressing   Problem: Safety: Goal: Ability to remain free from injury will improve Outcome: Progressing   

## 2023-05-18 NOTE — Progress Notes (Signed)
PROGRESS NOTE    [name]  YQM:[id number] DOB: 08/17/1961 DOA: 05/15/2023 PCP: [name], FNP    Chief Complaint  Patient presents with   Leg Pain    Right Leg   Weakness    Brief Narrative:  Patient pleasant 62 year old female history of chronic diastolic CHF, hypertension, anemia of chronic disease with baseline hemoglobin 8-10, CKD stage IV with baseline creatinine 1.7-2.5 admitted [name], ED with acute on chronic diastolic CHF as well as acute diverticulitis.   Assessment & Plan:   Principal Problem:   Acute on chronic diastolic heart failure (HCC) Active Problems:   Essential hypertension   Hyperlipidemia   Anemia of chronic disease   Acute diverticulitis   Pyuria, sterile   Chronic kidney disease (CKD), stage IV (severe) (HCC)   #1 acute on chronic diastolic CHF -Patient presented with 3 to 4-day history of progressive shortness of breath, orthopnea, bilateral lower extremity edema. -Chest x-ray done with cardiomegaly. -BNP noted elevated at 240.7. -??  Etiology. -Cardiac enzymes flattened at 23, patient denies any ongoing chest pain. -2D echo obtained with a EF of 60 to 65%,NWMA, grade 1 diastolic dysfunction, severely dilated left atrial size, mildly dilated right atrial size.  Mild AVR. -Patient placed on Lasix 40 mg IV every 12 hours, urine output not properly recorded. -Patient noted with issues with IV access early on during the hospitalization and currently now with IV access.   -Clinical improvement.   -Continue spironolactone.   -Transition from IV Lasix to oral Lasix.   -Supportive care.  2.  Acute diverticulitis -Patient on presentation with complaints of right-sided lower quadrant abdominal pain and some progressive generalized abdominal discomfort x 2 to 3 days. -CT abdomen and pelvis done on admission with acute diverticulitis of the proximal ascending colon. -Patient improving clinically, abdominal pain improvement and tolerating  soft diet.   -Currently on IV Rocephin and IV Flagyl and will transition to Augmentin to complete a 7 to 10-day course of antibiotic treatment.   -Supportive care.   3.  Asymptomatic pyuria -Urinalysis with pyuria.Patient with -Patient denies any dysuria. -Patient with lower abdominal pain, likely abdominal pain secondary to acute diverticulitis. -Urine cultures with 30,000 colonies of E. coli. -Patient has received at least 3 days of IV antibiotics. -Patient on Rocephin and will transition to Augmentin.    4.  Hypertension -Patient noted on presentation to have systolic blood pressures in the 200s which were felt likely secondary to suboptimal pain control in the setting of chronic hypertension with improvement with systolic blood pressures after patient received some doses of IV morphine and IV fentanyl. -Continue Norvasc 10 mg daily, Coreg 6.25 mg twice daily, hydralazine 50 mg 3 times daily, spironolactone 25 mg daily, Imdur 60 mg daily. -Transition from IV Lasix to oral Lasix.  5.  Hyperlipidemia -Statin.  6.  Anemia of chronic disease -Baseline hemoglobin 8-10. -No overt bleeding.   -Hemoglobin currently stable at 8.2.   7.  CKD stage IV -Baseline creatinine 1.7-2.5. -Renal function at baseline, monitor with diuresis.  8.  Status post ORIF right ankle -Patient noted to have ORIF right ankle 04/18/2023 with revision 05/08/2023. -Patient currently in a hard cast below the knee. -Lower extremity Dopplers negative for DVT. -Continue current pain management, PT/OT.   DVT prophylaxis: Heparin Code Status: Full Family Communication: Updated patient.  No family at bedside.  Disposition: Likely home when clinically improved and medically stable and adequately diuresed with improvement with diverticulitis hopefully in the next 1 to  2 days.   Status is: Inpatient Remains inpatient appropriate because: Severity of illness   Consultants:  None  Procedures: 2D echo 05/16/2023 CT  abdomen and pelvis 05/15/2023 Chest x-ray 05/15/2023 Lower extremity Dopplers 05/15/2023  Antimicrobials:  Anti-infectives (From admission, onward)    Start     Dose/Rate Route Frequency Ordered Stop   05/16/23 2100  cefTRIAXone (ROCEPHIN) 2 g in sodium chloride 0.9 % 100 mL IVPB        2 g 200 mL/hr over 30 Minutes Intravenous Every 24 hours 05/15/23 2005     05/15/23 2030  metroNIDAZOLE (FLAGYL) IVPB 500 mg        500 mg 100 mL/hr over 60 Minutes Intravenous Every 12 hours 05/15/23 2005     05/15/23 2015  cefTRIAXone (ROCEPHIN) 2 g in sodium chloride 0.9 % 100 mL IVPB        2 g 200 mL/hr over 30 Minutes Intravenous  Once 05/15/23 2011 05/15/23 2142   05/15/23 1915  cefTRIAXone (ROCEPHIN) 1 g in sodium chloride 0.9 % 100 mL IVPB  Status:  Discontinued        1 g 200 mL/hr over 30 Minutes Intravenous  Once 05/15/23 1904 05/15/23 2011         Subjective: Patient sitting up in recliner.  Denies any chest pain or significant shortness of breath.  Feels left lower extremity swelling has improved with diuresis.  States that the right lower quadrant abdominal pain improving and now only has pain with bowel movements.  Stated having mushy stools.  Tolerated soft diet.   Objective: Vitals:   05/17/23 1207 05/17/23 2050 05/18/23 0510 05/18/23 0854  BP: 139/74 (!) 141/76 (!) 174/74 (!) 165/72  Pulse: 63 62 66   Resp:  16 16   Temp: 97.9 F (36.6 C) 98 F (36.7 C) 98.6 F (37 C)   TempSrc: Oral Oral Oral   SpO2: 100% 97% 100%   Weight:   99.6 kg     Intake/Output Summary (Last 24 hours) at 05/18/2023 1229 Last data filed at 05/18/2023 0830 Gross per 24 hour  Intake 620.03 ml  Output 700 ml  Net -79.97 ml   Filed Weights   05/16/23 0500 05/17/23 0402 05/18/23 0510  Weight: 98 kg 98.9 kg 99.6 kg    Examination:  General exam: NAD. Respiratory system: Lungs clear to auscultation bilaterally.  No wheezes, no crackles, no rhonchi.  Fair air movement.  Speaking in full sentences.    Cardiovascular system: RRR with 3/6 SEM.  Right lower extremity in cast.  Trace to 1+ left lower extremity edema.  Gastrointestinal system: Abdomen soft, nontender, nondistended, positive bowel sounds.  No rebound.  No guarding.  Central nervous system: Alert and oriented. No focal neurological deficits. Extremities: Right lower extremity in cast.  Trace to 1+ left lower extremity edema.  Chronic venous stasis changes noted.   Skin: No rashes, lesions or ulcers Psychiatry: Judgement and insight appear normal. Mood & affect appropriate.     Data Reviewed: I have personally reviewed following labs and imaging studies  CBC: Recent Labs  Lab 05/15/23 1648 05/16/23 0524 05/17/23 0442 05/18/23 0505  WBC 8.3 9.7 9.6 9.7  NEUTROABS 6.8 7.7  --   --   HGB 9.8* 9.3* 8.7* 8.2*  HCT 34.6* 32.6* 30.3* 28.5*  MCV 90.3 91.3 90.2 88.5  PLT 365 344 340 297    Basic Metabolic Panel: Recent Labs  Lab 05/15/23 1648 05/16/23 0524 05/17/23 0442 05/18/23 0505  NA 139  136 136 132*  K 4.0 3.7 3.5 3.4*  CL 102 103 101 99  CO2 22 24 25  21*  GLUCOSE 104* 107* 97 95  BUN 28* 29* 31* 31*  CREATININE 1.83* 1.94* 2.05* 2.17*  CALCIUM 9.0 8.7* 8.6* 8.1*  MG 2.3 2.4  --   --   PHOS  --   --  4.3  --     GFR: Estimated Creatinine Clearance: 33.2 mL/min (A) (by C-G formula based on SCr of 2.17 mg/dL (H)).  Liver Function Tests: Recent Labs  Lab 05/15/23 1648 05/16/23 0524 05/17/23 0442  AST 15 14*  --   ALT 15 14  --   ALKPHOS 79 72  --   BILITOT 1.0 0.5  --   PROT 8.7* 7.9  --   ALBUMIN 3.6 3.4* 3.1*    CBG: No results for input(s): "GLUCAP" in the last 168 hours.   Recent Results (from the past 240 hour(s))  Urine Culture     Status: Abnormal   Collection Time: 05/15/23  4:48 PM   Specimen: Urine, Clean Catch  Result Value Ref Range Status   Specimen Description   Final    URINE, CLEAN CATCH Performed at [facility], [address] [place], [place]  [address]    Special Requests   Final    NONE Performed at [facility], [address] [place], [place] [address]    Culture 30,000 COLONIES/mL ESCHERICHIA COLI (A)  Final   Report Status 05/18/2023 FINAL  Final   Organism ID, Bacteria ESCHERICHIA COLI (A)  Final      Susceptibility   Escherichia coli - MIC*    AMPICILLIN >=32 RESISTANT Resistant     CEFAZOLIN <=4 SENSITIVE Sensitive     CEFEPIME <=0.12 SENSITIVE Sensitive     CEFTRIAXONE <=0.25 SENSITIVE Sensitive     CIPROFLOXACIN <=0.25 SENSITIVE Sensitive     GENTAMICIN <=1 SENSITIVE Sensitive     IMIPENEM <=0.25 SENSITIVE Sensitive     NITROFURANTOIN <=16 SENSITIVE Sensitive     TRIMETH/SULFA <=20 SENSITIVE Sensitive     AMPICILLIN/SULBACTAM 16 INTERMEDIATE Intermediate     PIP/TAZO <=4 SENSITIVE Sensitive     * 30,000 COLONIES/mL ESCHERICHIA COLI         Radiology Studies: No results found.      Scheduled Meds:  amLODipine  10 mg Oral Daily   aspirin EC  81 mg Oral BID   atorvastatin  40 mg Oral Daily   carvedilol  6.25 mg Oral BID WC   ferrous gluconate  324 mg Oral Q breakfast   furosemide  40 mg Intravenous BID   heparin injection (subcutaneous)  5,000 Units Subcutaneous Q8H   hydrALAZINE  50 mg Oral TID   isosorbide mononitrate  60 mg Oral Daily   spironolactone  25 mg Oral Daily   Continuous Infusions:  cefTRIAXone (ROCEPHIN)  IV 2 g (05/17/23 2034)   metronidazole 500 mg (05/18/23 0900)     LOS: 3 days    Time spent: 40 minutes    Ramiro Harvest, MD [facility]   To contact the attending provider between 7A-7P or the covering provider during after hours 7P-7A, please log into the web site [email or web address] and access using universal [place] password for that web site. If you do not have the password, please call the hospital operator.  05/18/2023, 12:29 PM

## 2023-05-18 NOTE — Progress Notes (Signed)
PT Cancellation Note  Patient Details Name: [name] MRN: [id number] DOB: [birth year]   Cancelled Treatment:    Reason Eval/Treat Not Completed: Patient declined, no reason specified Pt declined to participate today reporting abdominal pain and issues with diarrhea.   Kati L Payson 05/18/2023, 3:10 PM Paulino Door, DPT Physical Therapist Acute Rehabilitation Services Office: [phone]

## 2023-05-19 DIAGNOSIS — K5792 Diverticulitis of intestine, part unspecified, without perforation or abscess without bleeding: Secondary | ICD-10-CM | POA: Diagnosis not present

## 2023-05-19 DIAGNOSIS — N184 Chronic kidney disease, stage 4 (severe): Secondary | ICD-10-CM | POA: Diagnosis not present

## 2023-05-19 DIAGNOSIS — D638 Anemia in other chronic diseases classified elsewhere: Secondary | ICD-10-CM | POA: Diagnosis not present

## 2023-05-19 DIAGNOSIS — I5033 Acute on chronic diastolic (congestive) heart failure: Secondary | ICD-10-CM | POA: Diagnosis not present

## 2023-05-19 LAB — BASIC METABOLIC PANEL
Anion gap: 11 (ref 5–15)
BUN: 35 mg/dL — ABNORMAL HIGH (ref 8–23)
CO2: 22 mmol/L (ref 22–32)
Calcium: 8.3 mg/dL — ABNORMAL LOW (ref 8.9–10.3)
Chloride: 101 mmol/L (ref 98–111)
Creatinine, Ser: 2.51 mg/dL — ABNORMAL HIGH (ref 0.44–1.00)
GFR, Estimated: 21 mL/min — ABNORMAL LOW (ref >60)
Glucose, Bld: 94 mg/dL (ref 70–99)
Potassium: 3.5 mmol/L (ref 3.5–5.1)
Sodium: 134 mmol/L — ABNORMAL LOW (ref 135–145)

## 2023-05-19 LAB — CBC WITH DIFFERENTIAL/PLATELET
Abs Immature Granulocytes: 0.07 10*3/uL (ref 0.00–0.07)
Basophils Absolute: 0 10*3/uL (ref 0.0–0.1)
Basophils Relative: 0 %
Eosinophils Absolute: 0 10*3/uL (ref 0.0–0.5)
Eosinophils Relative: 1 %
HCT: 28.3 % — ABNORMAL LOW (ref 36.0–46.0)
Hemoglobin: 8.1 g/dL — ABNORMAL LOW (ref 12.0–15.0)
Immature Granulocytes: 1 %
Lymphocytes Relative: 11 %
Lymphs Abs: 1 10*3/uL (ref 0.7–4.0)
MCH: 25.3 pg — ABNORMAL LOW (ref 26.0–34.0)
MCHC: 28.6 g/dL — ABNORMAL LOW (ref 30.0–36.0)
MCV: 88.4 fL (ref 80.0–100.0)
Monocytes Absolute: 1 10*3/uL (ref 0.1–1.0)
Monocytes Relative: 12 %
Neutro Abs: 6.7 10*3/uL (ref 1.7–7.7)
Neutrophils Relative %: 75 %
Platelets: 285 10*3/uL (ref 150–400)
RBC: 3.2 MIL/uL — ABNORMAL LOW (ref 3.87–5.11)
RDW: 15.6 % — ABNORMAL HIGH (ref 11.5–15.5)
WBC: 8.8 10*3/uL (ref 4.0–10.5)
nRBC: 0 % (ref 0.0–0.2)

## 2023-05-19 MED ORDER — SALINE SPRAY 0.65 % NA SOLN
1.0000 | NASAL | Status: DC | PRN
  Filled 2023-05-19: qty 44

## 2023-05-19 MED ORDER — FUROSEMIDE 40 MG PO TABS
40.0000 mg | ORAL_TABLET | Freq: Every day | ORAL | Status: DC
  Administered 2023-05-19 – 2023-05-20 (×2): 40 mg via ORAL
  Filled 2023-05-19 (×2): qty 1

## 2023-05-19 MED ORDER — FUROSEMIDE 40 MG PO TABS: 40.0000 mg | ORAL_TABLET | Freq: Every day | ORAL | Status: DC

## 2023-05-19 NOTE — Progress Notes (Signed)
PROGRESS NOTE    [name]  VWU:[id number] DOB: 1960/09/16 DOA: 05/15/2023 PCP: [name], FNP    Chief Complaint  Patient presents with   Leg Pain    Right Leg   Weakness    Brief Narrative:  Patient pleasant 62 year old female history of chronic diastolic CHF, hypertension, anemia of chronic disease with baseline hemoglobin 8-10, CKD stage IV with baseline creatinine 1.7-2.5 admitted [name], ED with acute on chronic diastolic CHF as well as acute diverticulitis.   Assessment & Plan:   Principal Problem:   Acute on chronic diastolic heart failure (HCC) Active Problems:   Essential hypertension   Hyperlipidemia   Anemia of chronic disease   Acute diverticulitis   Pyuria, sterile   Chronic kidney disease (CKD), stage IV (severe) (HCC)   #1 acute on chronic diastolic CHF -Patient presented with 3 to 4-day history of progressive shortness of breath, orthopnea, bilateral lower extremity edema. -Chest x-ray done with cardiomegaly. -BNP noted elevated at 240.7. -??  Etiology. -Cardiac enzymes flattened at 23, patient denies any ongoing chest pain. -2D echo obtained with a EF of 60 to 65%,NWMA, grade 1 diastolic dysfunction, severely dilated left atrial size, mildly dilated right atrial size.  Mild AVR. -Patient placed on Lasix 40 mg IV every 12 hours, urine output not properly recorded. -Patient noted with issues with IV access early on during the hospitalization and currently now with IV access.   -Clinical improvement.   -Transition from IV Lasix to oral Lasix.   -Continue spironolactone.   -Supportive care.   2.  Acute diverticulitis -Patient on presentation with complaints of right-sided lower quadrant abdominal pain and some progressive generalized abdominal discomfort x 2 to 3 days. -CT abdomen and pelvis done on admission with acute diverticulitis of the proximal ascending colon. -Clinical improvement, tolerating soft diet.   -Was on IV Rocephin and  IV Flagyl and has been transition to Augmentin to complete a 7 to 10-day course of antibiotic treatment. -Supportive care.  3.  Asymptomatic pyuria -Urinalysis with pyuria. -Patient denies any dysuria. -Patient with lower abdominal pain, likely abdominal pain secondary to acute diverticulitis. -Urine cultures with 30,000 colonies of E. coli. -Patient has received at least 3 days of IV antibiotics. -Patient was on Rocephin and transitioned to Augmentin.   4.  Hypertension -Patient noted on presentation to have systolic blood pressures in the 200s which were felt likely secondary to suboptimal pain control in the setting of chronic hypertension with improvement with systolic blood pressures after patient received some doses of IV morphine and IV fentanyl. -Continue Norvasc 10 mg daily, Coreg 6.25 mg twice daily, hydralazine 50 mg 3 times daily, spironolactone 25 mg daily, Imdur 60 mg daily. -Will transition from IV Lasix to oral Lasix today.    5.  Hyperlipidemia -Continue statin.   6.  Anemia of chronic disease -Baseline hemoglobin 8-10. -No overt bleeding.   -Hemoglobin stable at 8.1.   7.  CKD stage IV -Baseline creatinine 1.7-2.5. -Renal function at baseline, monitor with diuresis.  8.  Status post ORIF right ankle -Patient noted to have ORIF right ankle 04/18/2023 with revision 05/08/2023. -Patient currently in a hard cast below the knee. -Lower extremity Dopplers negative for DVT. -Continue current pain management, PT/OT.   DVT prophylaxis: Heparin Code Status: Full Family Communication: Updated patient.  No family at bedside.  Disposition: Likely home when clinically improved and medically stable and adequately diuresed with improvement with diverticulitis hopefully tomorrow.   Status is: Inpatient Remains inpatient  appropriate because: Severity of illness   Consultants:  None  Procedures: 2D echo 05/16/2023 CT abdomen and pelvis 05/15/2023 Chest x-ray  05/15/2023 Lower extremity Dopplers 05/15/2023  Antimicrobials:  Anti-infectives (From admission, onward)    Start     Dose/Rate Route Frequency Ordered Stop   05/18/23 1330  amoxicillin-clavulanate (AUGMENTIN) 875-125 MG per tablet 1 tablet        1 tablet Oral Every 12 hours 05/18/23 1231 05/25/23 0959   05/16/23 2100  cefTRIAXone (ROCEPHIN) 2 g in sodium chloride 0.9 % 100 mL IVPB  Status:  Discontinued        2 g 200 mL/hr over 30 Minutes Intravenous Every 24 hours 05/15/23 2005 05/18/23 1231   05/15/23 2030  metroNIDAZOLE (FLAGYL) IVPB 500 mg  Status:  Discontinued        500 mg 100 mL/hr over 60 Minutes Intravenous Every 12 hours 05/15/23 2005 05/18/23 1231   05/15/23 2015  cefTRIAXone (ROCEPHIN) 2 g in sodium chloride 0.9 % 100 mL IVPB        2 g 200 mL/hr over 30 Minutes Intravenous  Once 05/15/23 2011 05/15/23 2142   05/15/23 1915  cefTRIAXone (ROCEPHIN) 1 g in sodium chloride 0.9 % 100 mL IVPB  Status:  Discontinued        1 g 200 mL/hr over 30 Minutes Intravenous  Once 05/15/23 1904 05/15/23 2011         Subjective: Patient sitting up in recliner.  States some improvement with loose stools after Imodium yesterday.  Feels improvement with shortness of breath and lower extremity swelling.  Still with some abdominal pain but intermittent and tolerating current diet.   Objective: Vitals:   05/19/23 0441 05/19/23 0900 05/19/23 1100 05/19/23 1224  BP: (!) 150/70 (!) 141/66  (!) 120/59  Pulse: 73   61  Resp: 18   20  Temp: 98.2 F (36.8 C)   98.7 F (37.1 C)  TempSrc: Oral   Oral  SpO2: 99%   100%  Weight:   99.3 kg     Intake/Output Summary (Last 24 hours) at 05/19/2023 1345 Last data filed at 05/18/2023 1528 Gross per 24 hour  Intake 100 ml  Output --  Net 100 ml   Filed Weights   05/17/23 0402 05/18/23 0510 05/19/23 1100  Weight: 98.9 kg 99.6 kg 99.3 kg    Examination:  General exam: NAD. Respiratory system: CTAB.  No wheezes, no crackles, no rhonchi.   Fair air movement.  Speaking in full sentences.  Cardiovascular system: RRR with 3/6 SEM.  Right lower extremity in cast.  1+ left lower extremity edema.  Gastrointestinal system: Abdomen is soft, nontender, nondistended, positive bowel sounds.  No rebound.  No guarding.    Central nervous system: Alert and oriented. No focal neurological deficits. Extremities: Right lower extremity in cast.  1+ left lower extremity edema.  Chronic venous stasis changes noted.   Skin: No rashes, lesions or ulcers Psychiatry: Judgement and insight appear normal. Mood & affect appropriate.     Data Reviewed: I have personally reviewed following labs and imaging studies  CBC: Recent Labs  Lab 05/15/23 1648 05/16/23 0524 05/17/23 0442 05/18/23 0505 05/19/23 0553  WBC 8.3 9.7 9.6 9.7 8.8  NEUTROABS 6.8 7.7  --   --  6.7  HGB 9.8* 9.3* 8.7* 8.2* 8.1*  HCT 34.6* 32.6* 30.3* 28.5* 28.3*  MCV 90.3 91.3 90.2 88.5 88.4  PLT 365 344 340 297 285    Basic Metabolic Panel: Recent Labs  Lab 05/15/23 1648 05/16/23 0524 05/17/23 0442 05/18/23 0505 05/19/23 0553  NA 139 136 136 132* 134*  K 4.0 3.7 3.5 3.4* 3.5  CL 102 103 101 99 101  CO2 22 24 25  21* 22  GLUCOSE 104* 107* 97 95 94  BUN 28* 29* 31* 31* 35*  CREATININE 1.83* 1.94* 2.05* 2.17* 2.51*  CALCIUM 9.0 8.7* 8.6* 8.1* 8.3*  MG 2.3 2.4  --   --   --   PHOS  --   --  4.3  --   --     GFR: Estimated Creatinine Clearance: 28.7 mL/min (A) (by C-G formula based on SCr of 2.51 mg/dL (H)).  Liver Function Tests: Recent Labs  Lab 05/15/23 1648 05/16/23 0524 05/17/23 0442  AST 15 14*  --   ALT 15 14  --   ALKPHOS 79 72  --   BILITOT 1.0 0.5  --   PROT 8.7* 7.9  --   ALBUMIN 3.6 3.4* 3.1*    CBG: No results for input(s): "GLUCAP" in the last 168 hours.   Recent Results (from the past 240 hour(s))  Urine Culture     Status: Abnormal   Collection Time: 05/15/23  4:48 PM   Specimen: Urine, Clean Catch  Result Value Ref Range Status    Specimen Description   Final    URINE, CLEAN CATCH Performed at [facility], [address] [place], [place] [address]    Special Requests   Final    NONE Performed at [facility], [address], [place], [place] [address]    Culture 30,000 COLONIES/mL ESCHERICHIA COLI (A)  Final   Report Status 05/18/2023 FINAL  Final   Organism ID, Bacteria ESCHERICHIA COLI (A)  Final      Susceptibility   Escherichia coli - MIC*    AMPICILLIN >=32 RESISTANT Resistant     CEFAZOLIN <=4 SENSITIVE Sensitive     CEFEPIME <=0.12 SENSITIVE Sensitive     CEFTRIAXONE <=0.25 SENSITIVE Sensitive     CIPROFLOXACIN <=0.25 SENSITIVE Sensitive     GENTAMICIN <=1 SENSITIVE Sensitive     IMIPENEM <=0.25 SENSITIVE Sensitive     NITROFURANTOIN <=16 SENSITIVE Sensitive     TRIMETH/SULFA <=20 SENSITIVE Sensitive     AMPICILLIN/SULBACTAM 16 INTERMEDIATE Intermediate     PIP/TAZO <=4 SENSITIVE Sensitive     * 30,000 COLONIES/mL ESCHERICHIA COLI         Radiology Studies: No results found.      Scheduled Meds:  amLODipine  10 mg Oral Daily   amoxicillin-clavulanate  1 tablet Oral Q12H   aspirin EC  81 mg Oral BID   atorvastatin  40 mg Oral Daily   carvedilol  6.25 mg Oral BID WC   ferrous gluconate  324 mg Oral Q breakfast   [START ON 05/20/2023] furosemide  40 mg Oral Daily   heparin injection (subcutaneous)  5,000 Units Subcutaneous Q8H   hydrALAZINE  50 mg Oral TID   isosorbide mononitrate  60 mg Oral Daily   spironolactone  25 mg Oral Daily   Continuous Infusions:     LOS: 4 days    Time spent: 40 minutes    Ramiro Harvest, MD [facility]   To contact the attending provider between 7A-7P or the covering provider during after hours 7P-7A, please log into the web site [email or web address] and access using universal [place] password for that web site. If you do not have the password, please call the hospital operator.  05/19/2023, 1:45 PM

## 2023-05-19 NOTE — Plan of Care (Signed)
  Problem: Pain Managment: Goal: General experience of comfort will improve Outcome: Progressing   Problem: Safety: Goal: Ability to remain free from injury will improve Outcome: Progressing   

## 2023-05-19 NOTE — Progress Notes (Signed)
Pt informed RN that her IV came out. MD aware. OK to remain without IV at this time. Will page IV team if IV required.

## 2023-05-20 DIAGNOSIS — I10 Essential (primary) hypertension: Secondary | ICD-10-CM | POA: Diagnosis not present

## 2023-05-20 DIAGNOSIS — K5792 Diverticulitis of intestine, part unspecified, without perforation or abscess without bleeding: Secondary | ICD-10-CM | POA: Diagnosis not present

## 2023-05-20 DIAGNOSIS — E7800 Pure hypercholesterolemia, unspecified: Secondary | ICD-10-CM | POA: Diagnosis not present

## 2023-05-20 DIAGNOSIS — I5033 Acute on chronic diastolic (congestive) heart failure: Secondary | ICD-10-CM | POA: Diagnosis not present

## 2023-05-20 LAB — CBC
HCT: 31.5 % — ABNORMAL LOW (ref 36.0–46.0)
Hemoglobin: 9 g/dL — ABNORMAL LOW (ref 12.0–15.0)
MCH: 25.9 pg — ABNORMAL LOW (ref 26.0–34.0)
MCHC: 28.6 g/dL — ABNORMAL LOW (ref 30.0–36.0)
MCV: 90.5 fL (ref 80.0–100.0)
Platelets: 330 10*3/uL (ref 150–400)
RBC: 3.48 MIL/uL — ABNORMAL LOW (ref 3.87–5.11)
RDW: 15.7 % — ABNORMAL HIGH (ref 11.5–15.5)
WBC: 10.6 10*3/uL — ABNORMAL HIGH (ref 4.0–10.5)
nRBC: 0 % (ref 0.0–0.2)

## 2023-05-20 LAB — BASIC METABOLIC PANEL
Anion gap: 12 (ref 5–15)
BUN: 38 mg/dL — ABNORMAL HIGH (ref 8–23)
CO2: 23 mmol/L (ref 22–32)
Calcium: 8.7 mg/dL — ABNORMAL LOW (ref 8.9–10.3)
Chloride: 101 mmol/L (ref 98–111)
Creatinine, Ser: 2.37 mg/dL — ABNORMAL HIGH (ref 0.44–1.00)
GFR, Estimated: 23 mL/min — ABNORMAL LOW (ref >60)
Glucose, Bld: 89 mg/dL (ref 70–99)
Potassium: 3.9 mmol/L (ref 3.5–5.1)
Sodium: 136 mmol/L (ref 135–145)

## 2023-05-20 MED ORDER — AMOXICILLIN-POT CLAVULANATE 875-125 MG PO TABS: 1.0000 | ORAL_TABLET | Freq: Two times a day (BID) | ORAL | 0 refills | Status: AC

## 2023-05-20 MED ORDER — LOPERAMIDE HCL 2 MG PO CAPS: 2.0000 mg | ORAL_CAPSULE | ORAL | Status: DC | PRN

## 2023-05-20 MED ORDER — FUROSEMIDE 40 MG PO TABS: 40.0000 mg | ORAL_TABLET | Freq: Every day | ORAL | 1 refills | Status: DC

## 2023-05-20 MED ORDER — OXYCODONE HCL 5 MG PO TABS
5.0000 mg | ORAL_TABLET | ORAL | 0 refills | Status: DC | PRN
Start: 2023-05-20 — End: 2023-08-01

## 2023-05-20 NOTE — Plan of Care (Signed)
  Problem: Education: Goal: Knowledge of General Education information will improve Description Including pain rating scale, medication(s)/side effects and non-pharmacologic comfort measures Outcome: Progressing   Problem: Health Behavior/Discharge Planning: Goal: Ability to manage health-related needs will improve Outcome: Progressing   Problem: Activity: Goal: Risk for activity intolerance will decrease Outcome: Progressing   Problem: Safety: Goal: Ability to remain free from injury will improve Outcome: Progressing   Problem: Cardiac: Goal: Ability to achieve and maintain adequate cardiopulmonary perfusion will improve Outcome: Progressing   

## 2023-05-20 NOTE — Progress Notes (Signed)
The patient is stable, no change from am assessment.Discharge instructions were reviewed. Questions, concerns denied at this time.

## 2023-05-20 NOTE — Discharge Summary (Signed)
Physician Discharge Summary  [name] WUJ:[id number] DOB: 1960/09/06 DOA: 05/15/2023  PCP: [name], FNP  Admit date: 05/15/2023 Discharge date: 05/20/2023  Time spent: 60 minutes  Recommendations for Outpatient Follow-up:  Follow-up with [name], FNP in 1 week.  On follow-up patient need a basic metabolic profile done to follow-up on electrolytes and renal function.  Patient's volume status will need to be reassessed on follow-up.  Patient is acute diverticulitis will need to be followed up upon.   Discharge Diagnoses:  Principal Problem:   Acute on chronic diastolic heart failure (HCC) Active Problems:   Essential hypertension   Hyperlipidemia   Anemia of chronic disease   Acute diverticulitis   Pyuria, sterile   Chronic kidney disease (CKD), stage IV (severe) (HCC)   Discharge Condition: Stable and improved.  Diet recommendation: Heart healthy  Filed Weights   05/18/23 0510 05/19/23 1100 05/20/23 0500  Weight: 99.6 kg 99.3 kg 99.9 kg    History of present illness:  HPI per Dr. Arsenio Katz [place] is a 62 y.o. female with medical history significant for chronic diastolic heart failure, essential hypertension, anemia of chronic diseases of basal hemoglobin 8-10, CKD stage IV associated baseline creatinine 1.7-2.5, who is admitted to [facility] on 05/15/2023 with acute on chronic diastolic heart failure after presenting from home to [facility] ED complaining of shortness of breath.    The patient reports 3 to 4 days of progressive shortness of breath associate with orthopnea, as well as worsening of edema in the bilateral lower extremities, associated with some pitting.  Denies any associated subjective fever, chills or rigors, or generalized manage urinalysis with any wheezing, cough, chest pain, palpitations, diaphoresis, dizziness Recent B, syncope, nausea, vomiting.  No recent pleuritic chest discomfort or hemoptysis.  Denies any recent calf tenderness  or new lower extremity erythema.   She confirms a history of chronic diastolic heart failure, with most recent echocardiogram performed in January 2020, which showed severe LVH, LVEF 66 5%, no evidence of focal Measurin advised, grade 1 diastolic dysfunction, trivial mitral gravitation, trivial aortic regurgitation and normal right ventricular systolic function.  In terms of outpatient diuretic medications, she is on spironolactone.  Not on any loop diuretics at home.   She also complains of to 3 days of progressive crampy generalized abdominal discomfort, worse in the right lower quadrant, with associated exacerbation with palpation over this area.  No recent trauma.  Denies any associated diarrhea, melena, or hematochezia.  No recent dysuria, gross hematuria, or change in urinary urgency/frequency.   She recently underwent ORIF of the right ankle on 04/18/2023, with ensuing revision on 05/08/2023.       ED Course:  Vital signs in the ED were notable for the following: Afebrile; heart rates in the range of 70s to 80s; systolic blood pressures initially in the 220s, 1 teens improvement in the 160s following interval improvement in her abdominal discomfort with multiple IV opioid medications, as further detailed below; respiratory rate 14-18, oxygen saturation 98-1 high percent room air.   Labs were notable for the following: CMP notable for the following: Sodium 139 potassium 4.0, bicarbonate 22, creatinine 1.83 compared most recent prior value of 2.09 on 05/08/2023, liver enzymes within normal limits.  BNP 240, relative to 80 in April 2022.  High sensitive troponin I x 2 were Biphetamine 23, relative to most recent prior value of 26 in April 2022, CBC notable for white cell count 8300, hemoglobin 9.8 associated with normocytic properties and relative demonstration  Physician Discharge Summary  [name] WUJ:[id number] DOB: 1960/09/06 DOA: 05/15/2023  PCP: [name], FNP  Admit date: 05/15/2023 Discharge date: 05/20/2023  Time spent: 60 minutes  Recommendations for Outpatient Follow-up:  Follow-up with [name], FNP in 1 week.  On follow-up patient need a basic metabolic profile done to follow-up on electrolytes and renal function.  Patient's volume status will need to be reassessed on follow-up.  Patient is acute diverticulitis will need to be followed up upon.   Discharge Diagnoses:  Principal Problem:   Acute on chronic diastolic heart failure (HCC) Active Problems:   Essential hypertension   Hyperlipidemia   Anemia of chronic disease   Acute diverticulitis   Pyuria, sterile   Chronic kidney disease (CKD), stage IV (severe) (HCC)   Discharge Condition: Stable and improved.  Diet recommendation: Heart healthy  Filed Weights   05/18/23 0510 05/19/23 1100 05/20/23 0500  Weight: 99.6 kg 99.3 kg 99.9 kg    History of present illness:  HPI per Dr. Arsenio Katz [place] is a 62 y.o. female with medical history significant for chronic diastolic heart failure, essential hypertension, anemia of chronic diseases of basal hemoglobin 8-10, CKD stage IV associated baseline creatinine 1.7-2.5, who is admitted to [facility] on 05/15/2023 with acute on chronic diastolic heart failure after presenting from home to [facility] ED complaining of shortness of breath.    The patient reports 3 to 4 days of progressive shortness of breath associate with orthopnea, as well as worsening of edema in the bilateral lower extremities, associated with some pitting.  Denies any associated subjective fever, chills or rigors, or generalized manage urinalysis with any wheezing, cough, chest pain, palpitations, diaphoresis, dizziness Recent B, syncope, nausea, vomiting.  No recent pleuritic chest discomfort or hemoptysis.  Denies any recent calf tenderness  or new lower extremity erythema.   She confirms a history of chronic diastolic heart failure, with most recent echocardiogram performed in January 2020, which showed severe LVH, LVEF 66 5%, no evidence of focal Measurin advised, grade 1 diastolic dysfunction, trivial mitral gravitation, trivial aortic regurgitation and normal right ventricular systolic function.  In terms of outpatient diuretic medications, she is on spironolactone.  Not on any loop diuretics at home.   She also complains of to 3 days of progressive crampy generalized abdominal discomfort, worse in the right lower quadrant, with associated exacerbation with palpation over this area.  No recent trauma.  Denies any associated diarrhea, melena, or hematochezia.  No recent dysuria, gross hematuria, or change in urinary urgency/frequency.   She recently underwent ORIF of the right ankle on 04/18/2023, with ensuing revision on 05/08/2023.       ED Course:  Vital signs in the ED were notable for the following: Afebrile; heart rates in the range of 70s to 80s; systolic blood pressures initially in the 220s, 1 teens improvement in the 160s following interval improvement in her abdominal discomfort with multiple IV opioid medications, as further detailed below; respiratory rate 14-18, oxygen saturation 98-1 high percent room air.   Labs were notable for the following: CMP notable for the following: Sodium 139 potassium 4.0, bicarbonate 22, creatinine 1.83 compared most recent prior value of 2.09 on 05/08/2023, liver enzymes within normal limits.  BNP 240, relative to 80 in April 2022.  High sensitive troponin I x 2 were Biphetamine 23, relative to most recent prior value of 26 in April 2022, CBC notable for white cell count 8300, hemoglobin 9.8 associated with normocytic properties and relative demonstration  2.60 cm/m   RA Area:     21.50 cm LA Vol (A2C):   109.0 ml 51.61 ml/m  RA Volume:   61.90 ml  29.31 ml/m LA Vol (A4C):   104.0 ml 49.24 ml/m LA Biplane Vol: 114.0 ml 53.98 ml/m  AORTIC VALVE LVOT Vmax:   147.00 cm/s LVOT Vmean:  108.000 cm/s LVOT VTI:    0.327 m  AORTA Ao Root diam: 3.30 cm Ao Asc diam:  3.50 cm MITRAL VALVE MV Area (PHT): 2.40 cm     SHUNTS MV Decel Time: 316 msec     Systemic VTI:  0.33 m MV E velocity: 71.00 cm/s   Systemic Diam: 2.00 cm MV A velocity: 126.00 cm/s MV E/A ratio:  0.56 Armanda Magic MD Electronically signed by Armanda Magic MD Signature Date/Time: 05/16/2023/12:26:22 PM    Final    VAS [place] LOWER EXTREMITY VENOUS (DVT) (7a-7p)  Result Date: 05/16/2023  Lower Venous DVT Study Patient Name:  [name]   Date of Exam:   05/15/2023 Medical Rec #: [id number]         Accession #:    [id number] Date of Birth: [birth year]         Patient Gender: F Patient Age:   88 years Exam Location:  [facility] Procedure:      VAS [place] LOWER EXTREMITY VENOUS (DVT) Referring Phys: Marja Kays DAVIS --------------------------------------------------------------------------------  Indications: Pain.  Risk Factors: Trauma. Limitations: Body habitus, poor ultrasound/tissue interface, orthopaedic appliance and Right lower leg hard cast. Comparison Study: No prior studies. Performing Technologist: Chanda Busing RVT  Examination Guidelines: A complete evaluation includes B-mode imaging, spectral Doppler, color Doppler, and power Doppler as needed of all accessible portions of each vessel. Bilateral testing is considered an integral part of a complete examination. Limited examinations for reoccurring indications may be performed as noted. The reflux portion of the exam is performed with the patient in reverse Trendelenburg.  +---------+---------------+---------+-----------+----------+-------------------+ RIGHT    CompressibilityPhasicitySpontaneityPropertiesThrombus Aging      +---------+---------------+---------+-----------+----------+-------------------+ CFV      Full           Yes      Yes                                      +---------+---------------+---------+-----------+----------+-------------------+ SFJ      Full                                                             +---------+---------------+---------+-----------+----------+-------------------+ FV Prox  Full                                                             +---------+---------------+---------+-----------+----------+-------------------+ FV Mid   Full                                                             +---------+---------------+---------+-----------+----------+-------------------+  Physician Discharge Summary  [name] WUJ:[id number] DOB: 1960/09/06 DOA: 05/15/2023  PCP: [name], FNP  Admit date: 05/15/2023 Discharge date: 05/20/2023  Time spent: 60 minutes  Recommendations for Outpatient Follow-up:  Follow-up with [name], FNP in 1 week.  On follow-up patient need a basic metabolic profile done to follow-up on electrolytes and renal function.  Patient's volume status will need to be reassessed on follow-up.  Patient is acute diverticulitis will need to be followed up upon.   Discharge Diagnoses:  Principal Problem:   Acute on chronic diastolic heart failure (HCC) Active Problems:   Essential hypertension   Hyperlipidemia   Anemia of chronic disease   Acute diverticulitis   Pyuria, sterile   Chronic kidney disease (CKD), stage IV (severe) (HCC)   Discharge Condition: Stable and improved.  Diet recommendation: Heart healthy  Filed Weights   05/18/23 0510 05/19/23 1100 05/20/23 0500  Weight: 99.6 kg 99.3 kg 99.9 kg    History of present illness:  HPI per Dr. Arsenio Katz [place] is a 62 y.o. female with medical history significant for chronic diastolic heart failure, essential hypertension, anemia of chronic diseases of basal hemoglobin 8-10, CKD stage IV associated baseline creatinine 1.7-2.5, who is admitted to [facility] on 05/15/2023 with acute on chronic diastolic heart failure after presenting from home to [facility] ED complaining of shortness of breath.    The patient reports 3 to 4 days of progressive shortness of breath associate with orthopnea, as well as worsening of edema in the bilateral lower extremities, associated with some pitting.  Denies any associated subjective fever, chills or rigors, or generalized manage urinalysis with any wheezing, cough, chest pain, palpitations, diaphoresis, dizziness Recent B, syncope, nausea, vomiting.  No recent pleuritic chest discomfort or hemoptysis.  Denies any recent calf tenderness  or new lower extremity erythema.   She confirms a history of chronic diastolic heart failure, with most recent echocardiogram performed in January 2020, which showed severe LVH, LVEF 66 5%, no evidence of focal Measurin advised, grade 1 diastolic dysfunction, trivial mitral gravitation, trivial aortic regurgitation and normal right ventricular systolic function.  In terms of outpatient diuretic medications, she is on spironolactone.  Not on any loop diuretics at home.   She also complains of to 3 days of progressive crampy generalized abdominal discomfort, worse in the right lower quadrant, with associated exacerbation with palpation over this area.  No recent trauma.  Denies any associated diarrhea, melena, or hematochezia.  No recent dysuria, gross hematuria, or change in urinary urgency/frequency.   She recently underwent ORIF of the right ankle on 04/18/2023, with ensuing revision on 05/08/2023.       ED Course:  Vital signs in the ED were notable for the following: Afebrile; heart rates in the range of 70s to 80s; systolic blood pressures initially in the 220s, 1 teens improvement in the 160s following interval improvement in her abdominal discomfort with multiple IV opioid medications, as further detailed below; respiratory rate 14-18, oxygen saturation 98-1 high percent room air.   Labs were notable for the following: CMP notable for the following: Sodium 139 potassium 4.0, bicarbonate 22, creatinine 1.83 compared most recent prior value of 2.09 on 05/08/2023, liver enzymes within normal limits.  BNP 240, relative to 80 in April 2022.  High sensitive troponin I x 2 were Biphetamine 23, relative to most recent prior value of 26 in April 2022, CBC notable for white cell count 8300, hemoglobin 9.8 associated with normocytic properties and relative demonstration  2.60 cm/m   RA Area:     21.50 cm LA Vol (A2C):   109.0 ml 51.61 ml/m  RA Volume:   61.90 ml  29.31 ml/m LA Vol (A4C):   104.0 ml 49.24 ml/m LA Biplane Vol: 114.0 ml 53.98 ml/m  AORTIC VALVE LVOT Vmax:   147.00 cm/s LVOT Vmean:  108.000 cm/s LVOT VTI:    0.327 m  AORTA Ao Root diam: 3.30 cm Ao Asc diam:  3.50 cm MITRAL VALVE MV Area (PHT): 2.40 cm     SHUNTS MV Decel Time: 316 msec     Systemic VTI:  0.33 m MV E velocity: 71.00 cm/s   Systemic Diam: 2.00 cm MV A velocity: 126.00 cm/s MV E/A ratio:  0.56 Armanda Magic MD Electronically signed by Armanda Magic MD Signature Date/Time: 05/16/2023/12:26:22 PM    Final    VAS [place] LOWER EXTREMITY VENOUS (DVT) (7a-7p)  Result Date: 05/16/2023  Lower Venous DVT Study Patient Name:  [name]   Date of Exam:   05/15/2023 Medical Rec #: [id number]         Accession #:    [id number] Date of Birth: [birth year]         Patient Gender: F Patient Age:   88 years Exam Location:  [facility] Procedure:      VAS [place] LOWER EXTREMITY VENOUS (DVT) Referring Phys: Marja Kays DAVIS --------------------------------------------------------------------------------  Indications: Pain.  Risk Factors: Trauma. Limitations: Body habitus, poor ultrasound/tissue interface, orthopaedic appliance and Right lower leg hard cast. Comparison Study: No prior studies. Performing Technologist: Chanda Busing RVT  Examination Guidelines: A complete evaluation includes B-mode imaging, spectral Doppler, color Doppler, and power Doppler as needed of all accessible portions of each vessel. Bilateral testing is considered an integral part of a complete examination. Limited examinations for reoccurring indications may be performed as noted. The reflux portion of the exam is performed with the patient in reverse Trendelenburg.  +---------+---------------+---------+-----------+----------+-------------------+ RIGHT    CompressibilityPhasicitySpontaneityPropertiesThrombus Aging      +---------+---------------+---------+-----------+----------+-------------------+ CFV      Full           Yes      Yes                                      +---------+---------------+---------+-----------+----------+-------------------+ SFJ      Full                                                             +---------+---------------+---------+-----------+----------+-------------------+ FV Prox  Full                                                             +---------+---------------+---------+-----------+----------+-------------------+ FV Mid   Full                                                             +---------+---------------+---------+-----------+----------+-------------------+  2.60 cm/m   RA Area:     21.50 cm LA Vol (A2C):   109.0 ml 51.61 ml/m  RA Volume:   61.90 ml  29.31 ml/m LA Vol (A4C):   104.0 ml 49.24 ml/m LA Biplane Vol: 114.0 ml 53.98 ml/m  AORTIC VALVE LVOT Vmax:   147.00 cm/s LVOT Vmean:  108.000 cm/s LVOT VTI:    0.327 m  AORTA Ao Root diam: 3.30 cm Ao Asc diam:  3.50 cm MITRAL VALVE MV Area (PHT): 2.40 cm     SHUNTS MV Decel Time: 316 msec     Systemic VTI:  0.33 m MV E velocity: 71.00 cm/s   Systemic Diam: 2.00 cm MV A velocity: 126.00 cm/s MV E/A ratio:  0.56 Armanda Magic MD Electronically signed by Armanda Magic MD Signature Date/Time: 05/16/2023/12:26:22 PM    Final    VAS [place] LOWER EXTREMITY VENOUS (DVT) (7a-7p)  Result Date: 05/16/2023  Lower Venous DVT Study Patient Name:  [name]   Date of Exam:   05/15/2023 Medical Rec #: [id number]         Accession #:    [id number] Date of Birth: [birth year]         Patient Gender: F Patient Age:   88 years Exam Location:  [facility] Procedure:      VAS [place] LOWER EXTREMITY VENOUS (DVT) Referring Phys: Marja Kays DAVIS --------------------------------------------------------------------------------  Indications: Pain.  Risk Factors: Trauma. Limitations: Body habitus, poor ultrasound/tissue interface, orthopaedic appliance and Right lower leg hard cast. Comparison Study: No prior studies. Performing Technologist: Chanda Busing RVT  Examination Guidelines: A complete evaluation includes B-mode imaging, spectral Doppler, color Doppler, and power Doppler as needed of all accessible portions of each vessel. Bilateral testing is considered an integral part of a complete examination. Limited examinations for reoccurring indications may be performed as noted. The reflux portion of the exam is performed with the patient in reverse Trendelenburg.  +---------+---------------+---------+-----------+----------+-------------------+ RIGHT    CompressibilityPhasicitySpontaneityPropertiesThrombus Aging      +---------+---------------+---------+-----------+----------+-------------------+ CFV      Full           Yes      Yes                                      +---------+---------------+---------+-----------+----------+-------------------+ SFJ      Full                                                             +---------+---------------+---------+-----------+----------+-------------------+ FV Prox  Full                                                             +---------+---------------+---------+-----------+----------+-------------------+ FV Mid   Full                                                             +---------+---------------+---------+-----------+----------+-------------------+  MG capsule Commonly known as: IMODIUM Take 1 capsule (2 mg total) by mouth as needed for diarrhea or loose stools.   methocarbamol 500 MG tablet Commonly known as: ROBAXIN Take 500 mg by mouth every 6 (six) hours as needed.   oxyCODONE 5 MG immediate release tablet Commonly known as: Roxicodone Take 1 tablet (5 mg total) by mouth every 4 (four) hours as needed for severe pain.   polyethylene glycol 17 g packet Commonly known as: MIRALAX / GLYCOLAX Take 17 g by mouth daily as needed for mild constipation or moderate constipation.   spironolactone 25 MG tablet Commonly known as: ALDACTONE Take 1 tablet (25 mg total) by mouth daily.               Durable Medical Equipment  (From admission, onward)           Start     Ordered   05/18/23 0800  For home use only DME 3 n 1  Once        05/18/23 0759            Allergies  Allergen Reactions   Lisinopril Cough    Follow-up Information     [facility], [place] Follow up.   Why: HH physical therapy/occupational therapy Contact information: [address] [place] [address] [phone]         [facility] Follow up.   Why: bedside commode Contact information: [address] [place] [address] [phone]         [name], FNP. Schedule an appointment as soon as possible for a visit in 1 week(s).   Specialty: General Practice Why: Follow-up for hospital follow-up.  Will need BMET Contact information: [address] [place] [address] [phone]                  The results of significant diagnostics from this hospitalization (including imaging, microbiology, ancillary and laboratory) are listed below for reference.    Significant Diagnostic Studies: ECHOCARDIOGRAM COMPLETE  Result Date: 05/16/2023    ECHOCARDIOGRAM REPORT   Patient Name:   [name] Date of Exam: 05/16/2023 Medical Rec #:  [id number]        Height:       68.0 in Accession #:    [id number]       Weight:       216.0 lb Date of Birth:  05/10/1961        BSA:          2.112 m Patient Age:    62 years         BP:           179/74 mmHg Patient Gender: F                HR:           74 bpm. Exam Location:  Inpatient Procedure: 2D Echo, Color Doppler and Cardiac Doppler Indications:    CHF-Acute Diastolic I50.31  History:        Patient has prior history of Echocardiogram examinations, most                 recent 09/06/2018. CHF; Risk Factors:Hypertension and                 Dyslipidemia.  Sonographer:    Harriette Bouillon RDCS Referring Phys: [id number] JUSTIN B HOWERTER IMPRESSIONS  1. Left ventricular ejection fraction,  MG capsule Commonly known as: IMODIUM Take 1 capsule (2 mg total) by mouth as needed for diarrhea or loose stools.   methocarbamol 500 MG tablet Commonly known as: ROBAXIN Take 500 mg by mouth every 6 (six) hours as needed.   oxyCODONE 5 MG immediate release tablet Commonly known as: Roxicodone Take 1 tablet (5 mg total) by mouth every 4 (four) hours as needed for severe pain.   polyethylene glycol 17 g packet Commonly known as: MIRALAX / GLYCOLAX Take 17 g by mouth daily as needed for mild constipation or moderate constipation.   spironolactone 25 MG tablet Commonly known as: ALDACTONE Take 1 tablet (25 mg total) by mouth daily.               Durable Medical Equipment  (From admission, onward)           Start     Ordered   05/18/23 0800  For home use only DME 3 n 1  Once        05/18/23 0759            Allergies  Allergen Reactions   Lisinopril Cough    Follow-up Information     [facility], [place] Follow up.   Why: HH physical therapy/occupational therapy Contact information: [address] [place] [address] [phone]         [facility] Follow up.   Why: bedside commode Contact information: [address] [place] [address] [phone]         [name], FNP. Schedule an appointment as soon as possible for a visit in 1 week(s).   Specialty: General Practice Why: Follow-up for hospital follow-up.  Will need BMET Contact information: [address] [place] [address] [phone]                  The results of significant diagnostics from this hospitalization (including imaging, microbiology, ancillary and laboratory) are listed below for reference.    Significant Diagnostic Studies: ECHOCARDIOGRAM COMPLETE  Result Date: 05/16/2023    ECHOCARDIOGRAM REPORT   Patient Name:   [name] Date of Exam: 05/16/2023 Medical Rec #:  [id number]        Height:       68.0 in Accession #:    [id number]       Weight:       216.0 lb Date of Birth:  05/10/1961        BSA:          2.112 m Patient Age:    62 years         BP:           179/74 mmHg Patient Gender: F                HR:           74 bpm. Exam Location:  Inpatient Procedure: 2D Echo, Color Doppler and Cardiac Doppler Indications:    CHF-Acute Diastolic I50.31  History:        Patient has prior history of Echocardiogram examinations, most                 recent 09/06/2018. CHF; Risk Factors:Hypertension and                 Dyslipidemia.  Sonographer:    Harriette Bouillon RDCS Referring Phys: [id number] JUSTIN B HOWERTER IMPRESSIONS  1. Left ventricular ejection fraction,  Physician Discharge Summary  [name] WUJ:[id number] DOB: 1960/09/06 DOA: 05/15/2023  PCP: [name], FNP  Admit date: 05/15/2023 Discharge date: 05/20/2023  Time spent: 60 minutes  Recommendations for Outpatient Follow-up:  Follow-up with [name], FNP in 1 week.  On follow-up patient need a basic metabolic profile done to follow-up on electrolytes and renal function.  Patient's volume status will need to be reassessed on follow-up.  Patient is acute diverticulitis will need to be followed up upon.   Discharge Diagnoses:  Principal Problem:   Acute on chronic diastolic heart failure (HCC) Active Problems:   Essential hypertension   Hyperlipidemia   Anemia of chronic disease   Acute diverticulitis   Pyuria, sterile   Chronic kidney disease (CKD), stage IV (severe) (HCC)   Discharge Condition: Stable and improved.  Diet recommendation: Heart healthy  Filed Weights   05/18/23 0510 05/19/23 1100 05/20/23 0500  Weight: 99.6 kg 99.3 kg 99.9 kg    History of present illness:  HPI per Dr. Arsenio Katz [place] is a 62 y.o. female with medical history significant for chronic diastolic heart failure, essential hypertension, anemia of chronic diseases of basal hemoglobin 8-10, CKD stage IV associated baseline creatinine 1.7-2.5, who is admitted to [facility] on 05/15/2023 with acute on chronic diastolic heart failure after presenting from home to [facility] ED complaining of shortness of breath.    The patient reports 3 to 4 days of progressive shortness of breath associate with orthopnea, as well as worsening of edema in the bilateral lower extremities, associated with some pitting.  Denies any associated subjective fever, chills or rigors, or generalized manage urinalysis with any wheezing, cough, chest pain, palpitations, diaphoresis, dizziness Recent B, syncope, nausea, vomiting.  No recent pleuritic chest discomfort or hemoptysis.  Denies any recent calf tenderness  or new lower extremity erythema.   She confirms a history of chronic diastolic heart failure, with most recent echocardiogram performed in January 2020, which showed severe LVH, LVEF 66 5%, no evidence of focal Measurin advised, grade 1 diastolic dysfunction, trivial mitral gravitation, trivial aortic regurgitation and normal right ventricular systolic function.  In terms of outpatient diuretic medications, she is on spironolactone.  Not on any loop diuretics at home.   She also complains of to 3 days of progressive crampy generalized abdominal discomfort, worse in the right lower quadrant, with associated exacerbation with palpation over this area.  No recent trauma.  Denies any associated diarrhea, melena, or hematochezia.  No recent dysuria, gross hematuria, or change in urinary urgency/frequency.   She recently underwent ORIF of the right ankle on 04/18/2023, with ensuing revision on 05/08/2023.       ED Course:  Vital signs in the ED were notable for the following: Afebrile; heart rates in the range of 70s to 80s; systolic blood pressures initially in the 220s, 1 teens improvement in the 160s following interval improvement in her abdominal discomfort with multiple IV opioid medications, as further detailed below; respiratory rate 14-18, oxygen saturation 98-1 high percent room air.   Labs were notable for the following: CMP notable for the following: Sodium 139 potassium 4.0, bicarbonate 22, creatinine 1.83 compared most recent prior value of 2.09 on 05/08/2023, liver enzymes within normal limits.  BNP 240, relative to 80 in April 2022.  High sensitive troponin I x 2 were Biphetamine 23, relative to most recent prior value of 26 in April 2022, CBC notable for white cell count 8300, hemoglobin 9.8 associated with normocytic properties and relative demonstration  Physician Discharge Summary  [name] WUJ:[id number] DOB: 1960/09/06 DOA: 05/15/2023  PCP: [name], FNP  Admit date: 05/15/2023 Discharge date: 05/20/2023  Time spent: 60 minutes  Recommendations for Outpatient Follow-up:  Follow-up with [name], FNP in 1 week.  On follow-up patient need a basic metabolic profile done to follow-up on electrolytes and renal function.  Patient's volume status will need to be reassessed on follow-up.  Patient is acute diverticulitis will need to be followed up upon.   Discharge Diagnoses:  Principal Problem:   Acute on chronic diastolic heart failure (HCC) Active Problems:   Essential hypertension   Hyperlipidemia   Anemia of chronic disease   Acute diverticulitis   Pyuria, sterile   Chronic kidney disease (CKD), stage IV (severe) (HCC)   Discharge Condition: Stable and improved.  Diet recommendation: Heart healthy  Filed Weights   05/18/23 0510 05/19/23 1100 05/20/23 0500  Weight: 99.6 kg 99.3 kg 99.9 kg    History of present illness:  HPI per Dr. Arsenio Katz [place] is a 62 y.o. female with medical history significant for chronic diastolic heart failure, essential hypertension, anemia of chronic diseases of basal hemoglobin 8-10, CKD stage IV associated baseline creatinine 1.7-2.5, who is admitted to [facility] on 05/15/2023 with acute on chronic diastolic heart failure after presenting from home to [facility] ED complaining of shortness of breath.    The patient reports 3 to 4 days of progressive shortness of breath associate with orthopnea, as well as worsening of edema in the bilateral lower extremities, associated with some pitting.  Denies any associated subjective fever, chills or rigors, or generalized manage urinalysis with any wheezing, cough, chest pain, palpitations, diaphoresis, dizziness Recent B, syncope, nausea, vomiting.  No recent pleuritic chest discomfort or hemoptysis.  Denies any recent calf tenderness  or new lower extremity erythema.   She confirms a history of chronic diastolic heart failure, with most recent echocardiogram performed in January 2020, which showed severe LVH, LVEF 66 5%, no evidence of focal Measurin advised, grade 1 diastolic dysfunction, trivial mitral gravitation, trivial aortic regurgitation and normal right ventricular systolic function.  In terms of outpatient diuretic medications, she is on spironolactone.  Not on any loop diuretics at home.   She also complains of to 3 days of progressive crampy generalized abdominal discomfort, worse in the right lower quadrant, with associated exacerbation with palpation over this area.  No recent trauma.  Denies any associated diarrhea, melena, or hematochezia.  No recent dysuria, gross hematuria, or change in urinary urgency/frequency.   She recently underwent ORIF of the right ankle on 04/18/2023, with ensuing revision on 05/08/2023.       ED Course:  Vital signs in the ED were notable for the following: Afebrile; heart rates in the range of 70s to 80s; systolic blood pressures initially in the 220s, 1 teens improvement in the 160s following interval improvement in her abdominal discomfort with multiple IV opioid medications, as further detailed below; respiratory rate 14-18, oxygen saturation 98-1 high percent room air.   Labs were notable for the following: CMP notable for the following: Sodium 139 potassium 4.0, bicarbonate 22, creatinine 1.83 compared most recent prior value of 2.09 on 05/08/2023, liver enzymes within normal limits.  BNP 240, relative to 80 in April 2022.  High sensitive troponin I x 2 were Biphetamine 23, relative to most recent prior value of 26 in April 2022, CBC notable for white cell count 8300, hemoglobin 9.8 associated with normocytic properties and relative demonstration

## 2023-05-21 DIAGNOSIS — I5033 Acute on chronic diastolic (congestive) heart failure: Secondary | ICD-10-CM | POA: Diagnosis not present

## 2023-05-21 DIAGNOSIS — R8281 Pyuria: Secondary | ICD-10-CM | POA: Diagnosis not present

## 2023-05-21 DIAGNOSIS — N184 Chronic kidney disease, stage 4 (severe): Secondary | ICD-10-CM | POA: Diagnosis not present

## 2023-05-21 DIAGNOSIS — K5792 Diverticulitis of intestine, part unspecified, without perforation or abscess without bleeding: Secondary | ICD-10-CM | POA: Diagnosis not present

## 2023-05-21 NOTE — Consult Note (Signed)
[facility] Inpatient Consult   05/21/2023  [name] [birth year] [id number]  [facility]  Accountable Care Organization [ACO] Patient: [employer] Medicare [Dual Complete]   [facility] Liaison remote coverage review for patient that had admitted to [name]    Primary Care Provider:  [name], FNP with [facility]  Patient is currently active with [facility] Care Management for care coordination services.  Patient has been engaged by a [facility].  The community based plan of care has focused on disease management and community resource support.    Plan: Patient with upcoming appointment with [facility] POP HLT RN. Will likely have Community TOC follow up.  Will alert [facility] POP HLT RN of disposition from [facility] on 05/20/23.  Home Health noted from inpatient [facility] with [facility]   Of note, [facility] Care Management services does not replace or interfere with any services that are needed or arranged by inpatient [facility] care management team.   Charlesetta Shanks, RN, BSN, CCM [place]  [facility], [facility] Liaison Direct Dial: [phone] or secure chat Website: [email or web address]

## 2023-05-22 DIAGNOSIS — I872 Venous insufficiency (chronic) (peripheral): Secondary | ICD-10-CM | POA: Diagnosis not present

## 2023-05-22 DIAGNOSIS — S82891D Other fracture of right lower leg, subsequent encounter for closed fracture with routine healing: Secondary | ICD-10-CM | POA: Diagnosis not present

## 2023-05-22 DIAGNOSIS — N184 Chronic kidney disease, stage 4 (severe): Secondary | ICD-10-CM | POA: Diagnosis not present

## 2023-05-22 DIAGNOSIS — I5033 Acute on chronic diastolic (congestive) heart failure: Secondary | ICD-10-CM | POA: Diagnosis not present

## 2023-05-22 DIAGNOSIS — D631 Anemia in chronic kidney disease: Secondary | ICD-10-CM | POA: Diagnosis not present

## 2023-05-22 DIAGNOSIS — I130 Hypertensive heart and chronic kidney disease with heart failure and stage 1 through stage 4 chronic kidney disease, or unspecified chronic kidney disease: Secondary | ICD-10-CM | POA: Diagnosis not present

## 2023-05-29 ENCOUNTER — Encounter: Payer: Self-pay | Admitting: Nurse Practitioner

## 2023-05-29 ENCOUNTER — Ambulatory Visit: Payer: 59 | Admitting: Nurse Practitioner

## 2023-05-29 VITALS — BP 116/60 | HR 89 | Temp 98.0°F | Ht 68.0 in | Wt 220.0 lb

## 2023-05-29 DIAGNOSIS — I152 Hypertension secondary to endocrine disorders: Secondary | ICD-10-CM

## 2023-05-29 DIAGNOSIS — Z6833 Body mass index (BMI) 33.0-33.9, adult: Secondary | ICD-10-CM

## 2023-05-29 DIAGNOSIS — E66811 Obesity, class 1: Secondary | ICD-10-CM

## 2023-05-29 DIAGNOSIS — K5792 Diverticulitis of intestine, part unspecified, without perforation or abscess without bleeding: Secondary | ICD-10-CM | POA: Diagnosis not present

## 2023-05-29 DIAGNOSIS — Z2821 Immunization not carried out because of patient refusal: Secondary | ICD-10-CM

## 2023-05-29 DIAGNOSIS — I110 Hypertensive heart disease with heart failure: Secondary | ICD-10-CM | POA: Diagnosis not present

## 2023-05-29 DIAGNOSIS — Z09 Encounter for follow-up examination after completed treatment for conditions other than malignant neoplasm: Secondary | ICD-10-CM

## 2023-05-29 DIAGNOSIS — Z86018 Personal history of other benign neoplasm: Secondary | ICD-10-CM

## 2023-05-29 DIAGNOSIS — E279 Disorder of adrenal gland, unspecified: Secondary | ICD-10-CM | POA: Diagnosis not present

## 2023-05-29 DIAGNOSIS — Z23 Encounter for immunization: Secondary | ICD-10-CM

## 2023-05-29 DIAGNOSIS — G4701 Insomnia due to medical condition: Secondary | ICD-10-CM

## 2023-05-29 DIAGNOSIS — I10 Essential (primary) hypertension: Secondary | ICD-10-CM

## 2023-05-29 DIAGNOSIS — I5033 Acute on chronic diastolic (congestive) heart failure: Secondary | ICD-10-CM

## 2023-05-29 DIAGNOSIS — E6609 Other obesity due to excess calories: Secondary | ICD-10-CM

## 2023-05-29 MED ORDER — MELATONIN 3 MG PO TABS: 3.0000 mg | ORAL_TABLET | Freq: Every evening | ORAL | 1 refills | Status: DC | PRN

## 2023-05-30 LAB — BMP8+EGFR
BUN/Creatinine Ratio: 17 (ref 12–28)
BUN: 33 mg/dL — ABNORMAL HIGH (ref 8–27)
CO2: 21 mmol/L (ref 20–29)
Calcium: 8.5 mg/dL — ABNORMAL LOW (ref 8.7–10.3)
Chloride: 104 mmol/L (ref 96–106)
Creatinine, Ser: 1.99 mg/dL — ABNORMAL HIGH (ref 0.57–1.00)
Glucose: 98 mg/dL (ref 70–99)
Potassium: 4.5 mmol/L (ref 3.5–5.2)
Sodium: 142 mmol/L (ref 134–144)
eGFR: 28 mL/min/{1.73_m2} — ABNORMAL LOW (ref >59)

## 2023-05-31 ENCOUNTER — Other Ambulatory Visit: Payer: Self-pay | Admitting: Nurse Practitioner

## 2023-06-01 DIAGNOSIS — S82891D Other fracture of right lower leg, subsequent encounter for closed fracture with routine healing: Secondary | ICD-10-CM | POA: Diagnosis not present

## 2023-06-01 DIAGNOSIS — D631 Anemia in chronic kidney disease: Secondary | ICD-10-CM | POA: Diagnosis not present

## 2023-06-01 DIAGNOSIS — E269 Hyperaldosteronism, unspecified: Secondary | ICD-10-CM | POA: Diagnosis not present

## 2023-06-01 DIAGNOSIS — N184 Chronic kidney disease, stage 4 (severe): Secondary | ICD-10-CM | POA: Diagnosis not present

## 2023-06-01 DIAGNOSIS — I872 Venous insufficiency (chronic) (peripheral): Secondary | ICD-10-CM | POA: Diagnosis not present

## 2023-06-01 DIAGNOSIS — I5033 Acute on chronic diastolic (congestive) heart failure: Secondary | ICD-10-CM | POA: Diagnosis not present

## 2023-06-01 DIAGNOSIS — I129 Hypertensive chronic kidney disease with stage 1 through stage 4 chronic kidney disease, or unspecified chronic kidney disease: Secondary | ICD-10-CM | POA: Diagnosis not present

## 2023-06-01 DIAGNOSIS — N2581 Secondary hyperparathyroidism of renal origin: Secondary | ICD-10-CM | POA: Diagnosis not present

## 2023-06-01 DIAGNOSIS — I130 Hypertensive heart and chronic kidney disease with heart failure and stage 1 through stage 4 chronic kidney disease, or unspecified chronic kidney disease: Secondary | ICD-10-CM | POA: Diagnosis not present

## 2023-06-05 DIAGNOSIS — I130 Hypertensive heart and chronic kidney disease with heart failure and stage 1 through stage 4 chronic kidney disease, or unspecified chronic kidney disease: Secondary | ICD-10-CM | POA: Diagnosis not present

## 2023-06-05 DIAGNOSIS — N184 Chronic kidney disease, stage 4 (severe): Secondary | ICD-10-CM | POA: Diagnosis not present

## 2023-06-05 DIAGNOSIS — D631 Anemia in chronic kidney disease: Secondary | ICD-10-CM | POA: Diagnosis not present

## 2023-06-05 DIAGNOSIS — I872 Venous insufficiency (chronic) (peripheral): Secondary | ICD-10-CM | POA: Diagnosis not present

## 2023-06-05 DIAGNOSIS — S82891D Other fracture of right lower leg, subsequent encounter for closed fracture with routine healing: Secondary | ICD-10-CM | POA: Diagnosis not present

## 2023-06-05 DIAGNOSIS — I5033 Acute on chronic diastolic (congestive) heart failure: Secondary | ICD-10-CM | POA: Diagnosis not present

## 2023-06-06 ENCOUNTER — Encounter (HOSPITAL_BASED_OUTPATIENT_CLINIC_OR_DEPARTMENT_OTHER): Payer: Self-pay | Admitting: Cardiovascular Disease

## 2023-06-06 ENCOUNTER — Ambulatory Visit (HOSPITAL_BASED_OUTPATIENT_CLINIC_OR_DEPARTMENT_OTHER): Payer: 59 | Admitting: Cardiovascular Disease

## 2023-06-06 VITALS — BP 140/68 | HR 68 | Ht 68.0 in | Wt 215.0 lb

## 2023-06-06 DIAGNOSIS — E269 Hyperaldosteronism, unspecified: Secondary | ICD-10-CM

## 2023-06-06 DIAGNOSIS — Z5181 Encounter for therapeutic drug level monitoring: Secondary | ICD-10-CM | POA: Diagnosis not present

## 2023-06-06 DIAGNOSIS — E782 Mixed hyperlipidemia: Secondary | ICD-10-CM | POA: Diagnosis not present

## 2023-06-06 DIAGNOSIS — I517 Cardiomegaly: Secondary | ICD-10-CM | POA: Diagnosis not present

## 2023-06-06 DIAGNOSIS — I5033 Acute on chronic diastolic (congestive) heart failure: Secondary | ICD-10-CM

## 2023-06-06 DIAGNOSIS — I10 Essential (primary) hypertension: Secondary | ICD-10-CM

## 2023-06-06 MED ORDER — CARVEDILOL 12.5 MG PO TABS: 12.5000 mg | ORAL_TABLET | Freq: Two times a day (BID) | ORAL | 3 refills | Status: DC

## 2023-06-06 MED ORDER — ATORVASTATIN CALCIUM 80 MG PO TABS: 80.0000 mg | ORAL_TABLET | Freq: Every day | ORAL | 3 refills | Status: DC

## 2023-06-06 NOTE — Progress Notes (Signed)
Advanced Hypertension Clinic Follow-up:    Date:  06/06/2023   ID:  [name], DOB [birth year], MRN [id number]  PCP:  [name], FNP  Cardiologist:  Chilton Si, MD  Nephrologist: Dr. Thedore Mins, [facility]  Referring MD: [name], FNP   CC: Hypertension  History of Present Illness:    [name] is a 62 y.o. female with a hx of anemia, chronic diastolic heart failure, CKD 4, chronic bronchitis, GERD, hypertension, hypokalemia, venous stasis ulcers, and tobacco use, here for follow up.  She first established care in the hypertension clinic 01/11/2021.  She was seen in the ED 11/2020 with a blood pressure of 213/88. She had been drinking heavily and was experiencing palpitations. She previously had an echo 08/2018 with LVEF 60-65% and grade 1 diastolic dysfunction. She had severe LVH. When she last saw her PCP 10/2020, her blood pressure was uncontrolled and she was referred to cardiology.  She reported fatigue, and believed this is due to Norvasc. She has had hypertension since she was in her 71's. In 2018 she was noted to have an adrenal adenoma.  Labs at that time were consistent with hyperaldosteronism.  She was referred to Dr. Othella Chavez at [facility].  She was seen at [facility] and underwent adrenal vein sampling 02/2021 which was consistent with hypersecretion of aldosterone on the right. She confirms that she never proceeded with laparascopic R adrenalectomy.  Today, she states that recently she was hospitalized for 6 days with pericardial effusion, diverticulitis. At this time she is feeling better and is gradually returning to her baseline. In the office her blood pressure is 145/79 initially, and 140/68 on manual recheck. Recently well controlled at another clinic visit. This morning at home her blood pressure was 144/74. She takes her medications every morning at 6 AM with breakfast. Of note, she has been taking carvedilol once daily as she thought this was the prescribed dosing,  instead of BID as initially prescribed. She reports having a fall while at work on 04/20/23 resulting in right ankle fracture requiring multiple surgeries. Currently she is participating in PT. She complains of soreness and myalgias from constantly sitting in her wheelchair. Currently she is down to smoking 2 cigarettes a day; no alcohol consumption. She denies any palpitations, chest pain, shortness of breath, peripheral edema, lightheadedness, headaches, syncope, orthopnea, or PND.  Previous antihypertensives: Lisinopril  Past Medical History:  Diagnosis Date   Adrenal adenoma 01/11/2021   Anemia    CHF (congestive heart failure) (HCC)    Chronic bronchitis (HCC)    "probably once/yr" (05/12/2013)   Chronic kidney disease    Diastolic heart failure    Dyslipidemia    pt denies this hx on 05/12/2013   Exertional shortness of breath    GERD (gastroesophageal reflux disease)    Heart murmur    Hypertension    Hypokalemia    Left ventricular hypertrophy 01/11/2021   Venous stasis ulcers (HCC)     Past Surgical History:  Procedure Laterality Date   BIOPSY  02/23/2023   Procedure: BIOPSY;  Surgeon: Lemar Lofty., MD;  Location: [facility] ENDOSCOPY;  Service: Gastroenterology;;   COLONOSCOPY N/A 02/23/2023   Procedure: COLONOSCOPY;  Surgeon: Lemar Lofty., MD;  Location: [facility] ENDOSCOPY;  Service: Gastroenterology;  Laterality: N/A;   ESOPHAGOGASTRODUODENOSCOPY N/A 02/23/2023   Procedure: ESOPHAGOGASTRODUODENOSCOPY (EGD);  Surgeon: Lemar Lofty., MD;  Location: [facility] ENDOSCOPY;  Service: Gastroenterology;  Laterality: N/A;   HARDWARE REMOVAL Right 05/08/2023   Procedure: HARDWARE REMOVAL MEDIAL AND  LATERAL;  Surgeon: Terance Hart, MD;  Location: [facility] ORS;  Service: Orthopedics;  Laterality: Right;   HOT HEMOSTASIS N/A 02/23/2023   Procedure: HOT HEMOSTASIS (ARGON PLASMA COAGULATION/BICAP);  Surgeon: Lemar Lofty., MD;  Location: [facility] ENDOSCOPY;  Service:  Gastroenterology;  Laterality: N/A;   IR US GUIDE VASC ACCESS RIGHT  02/27/2017   IR VENOGRAM ADRENAL BI  02/27/2017   IR VENOGRAM HEPATIC WO HEMODYNAMIC EVALUATION  02/27/2017   IR VENOGRAM RENAL BI  02/27/2017   IR VENOUS SAMPLING  02/27/2017   IR VENOUS SAMPLING  02/27/2017   ORIF ANKLE FRACTURE Right 04/18/2023   Procedure: OPEN REDUCTION INTERNAL FIXATION (ORIF) ANKLE FRACTURE;  Surgeon: Joen Laura, MD;  Location: [facility] ORS;  Service: Orthopedics;  Laterality: Right;   ORIF ANKLE FRACTURE Right 05/08/2023   Procedure: REVISION OPEN REDUCTION INTERNAL FIXATION (ORIF) OF RIGHT TRIMALLEOLAR ANKLE FRACTURE, OPEN TREATMENT OF SYNDESMOSIS;  Surgeon: Terance Hart, MD;  Location: [facility] ORS;  Service: Orthopedics;  Laterality: Right;   POLYPECTOMY  02/23/2023   Procedure: POLYPECTOMY;  Surgeon: Meridee Score Netty Starring., MD;  Location: [facility] ENDOSCOPY;  Service: Gastroenterology;;   SYNDESMOSIS REPAIR Right 05/08/2023   Procedure: SYNDESMOSIS REPAIR;  Surgeon: Terance Hart, MD;  Location: [facility] ORS;  Service: Orthopedics;  Laterality: Right;   VAGINAL HYSTERECTOMY  1990's    Current Medications: Current Meds  Medication Sig   acetaminophen (TYLENOL) 325 MG tablet Take 650 mg by mouth every 6 (six) hours as needed for mild pain or headache.   amLODipine (NORVASC) 10 MG tablet Take 1 tablet (10 mg total) by mouth daily.   aspirin EC 81 MG tablet Take 1 tablet (81 mg total) by mouth 2 (two) times daily. For blood clot prevention. Take for 30 days after surgery. Swallow whole.   FARXIGA 10 MG TABS tablet Take 1 tablet (10 mg total) by mouth daily.   ferrous gluconate (FERGON) 324 MG tablet Take 1 tablet (324 mg total) by mouth daily with breakfast.   furosemide (LASIX) 40 MG tablet Take 1 tablet (40 mg total) by mouth daily.   hydrALAZINE (APRESOLINE) 50 MG tablet Take 1 tablet (50 mg total) by mouth 3 (three) times daily.   isosorbide mononitrate (IMDUR) 60 MG 24 hr tablet Take 1 tablet (60 mg  total) by mouth daily.   loperamide (IMODIUM) 2 MG capsule Take 1 capsule (2 mg total) by mouth as needed for diarrhea or loose stools.   melatonin 3 MG TABS tablet Take 1 tablet (3 mg total) by mouth at bedtime as needed.   methocarbamol (ROBAXIN) 500 MG tablet Take 500 mg by mouth every 6 (six) hours as needed.   oxyCODONE (ROXICODONE) 5 MG immediate release tablet Take 1 tablet (5 mg total) by mouth every 4 (four) hours as needed for severe pain.   polyethylene glycol (MIRALAX / GLYCOLAX) 17 g packet Take 17 g by mouth daily as needed for mild constipation or moderate constipation.   spironolactone (ALDACTONE) 25 MG tablet TAKE 1 TABLET BY MOUTH DAILY   [DISCONTINUED] atorvastatin (LIPITOR) 40 MG tablet TAKE 1 TABLET(40 MG) BY MOUTH DAILY (Patient taking differently: Take 40 mg by mouth daily.)   [DISCONTINUED] carvedilol (COREG) 6.25 MG tablet Take 1 tablet (6.25 mg total) by mouth 2 (two) times daily with a meal.     Allergies:   Lisinopril   Social History   Socioeconomic History   Marital status: Single    Spouse name: Not on file   Number of children:  Not on file   Years of education: Not on file   Highest education level: Not on file  Occupational History   Occupation: disability  Tobacco Use   Smoking status: Every Day    Current packs/day: 0.50    Average packs/day: 0.5 packs/day for 10.0 years (5.0 ttl pk-yrs)    Types: Cigarettes   Smokeless tobacco: Never   Tobacco comments:    trying to cut back, 3/29 - only had one cigarette today, average 4 a day  Vaping Use   Vaping status: Never Used  Substance and Sexual Activity   Alcohol use: Not Currently   Drug use: Not Currently    Types: Cocaine    Comment: 05/12/2013 "tried it a few days ago; didn't like it"   Sexual activity: Not Currently  Other Topics Concern   Not on file  Social History Narrative   Not on file   Social Determinants of Health   Financial Resource Strain: High Risk (11/06/2022)   Overall  Financial Resource Strain (CARDIA)    Difficulty of Paying Living Expenses: Very hard  Food Insecurity: No Food Insecurity (05/15/2023)   Hunger Vital Sign    Worried About Running Out of Food in the Last Year: Never true    Ran Out of Food in the Last Year: Never true  Transportation Needs: No Transportation Needs (05/15/2023)   PRAPARE - Administrator, Civil Service (Medical): No    Lack of Transportation (Non-Medical): No  Physical Activity: Insufficiently Active (11/06/2022)   Exercise Vital Sign    Days of Exercise per Week: 5 days    Minutes of Exercise per Session: 20 min  Stress: No Stress Concern Present (11/06/2022)   [employer] of Occupational Health - Occupational Stress Questionnaire    Feeling of Stress : Only a little  Social Connections: Moderately Isolated (11/06/2022)   Social Connection and Isolation Panel [NHANES]    Frequency of Communication with Friends and Family: Twice a week    Frequency of Social Gatherings with Friends and Family: Once a week    Attends Religious Services: More than 4 times per year    Active Member of [employer] or Organizations: No    Attends Banker Meetings: Never    Marital Status: Never married     Family History: The patient's family history includes Diabetes in her sister and sister; Heart attack in her father; Hypertension in her brother, mother, and sister; Kidney failure in her mother.  ROS:   Please see the history of present illness.    (+) Prior fall 03/2023 with right ankle fracture (+) Generalized soreness/myalgias All other systems reviewed and are negative.  EKGs/Labs/Other Studies Reviewed:    Echo  05/16/2023:  1. Left ventricular ejection fraction, by estimation, is 60 to 65%. The  left ventricle has normal function. The left ventricle has no regional  wall motion abnormalities. There is severe concentric left ventricular  hypertrophy. Left ventricular diastolic   parameters are consistent  with Grade I diastolic dysfunction (impaired  relaxation). Elevated left ventricular end-diastolic pressure.   2. Right ventricular systolic function is normal. The right ventricular  size is normal. Tricuspid regurgitation signal is inadequate for assessing  PA pressure.   3. Left atrial size was severely dilated.   4. Right atrial size was mildly dilated.   5. The mitral valve is normal in structure. No evidence of mitral valve  regurgitation. No evidence of mitral stenosis.   6. The aortic valve  is tricuspid. Aortic valve regurgitation is mild.  Aortic valve sclerosis/calcification is present, without any evidence of  aortic stenosis.   7. The inferior vena cava is normal in size with greater than 50%  respiratory variability, suggesting right atrial pressure of 3 mmHg.   US Arterial Seg Multiple LE 11/05/2020: Normal exam. No evidence of hemodynamically significant peripheral arterial disease.  Echo TTE 09/06/2018: - Left ventricle: The cavity size was normal. Wall thickness was    increased in a pattern of severe LVH. Systolic function was    normal. The estimated ejection fraction was in the range of 60%    to 65%. Wall motion was normal; there were no regional wall    motion abnormalities. Doppler parameters are consistent with    abnormal left ventricular relaxation (grade 1 diastolic    dysfunction).  - Aortic valve: There was no stenosis. There was trivial    regurgitation.  - Mitral valve: There was trivial regurgitation.  - Left atrium: The atrium was moderately dilated.  - Right ventricle: The cavity size was normal. Systolic function    was normal.  - Right atrium: The atrium was mildly dilated.  - Pulmonary arteries: No complete TR doppler jet so unable to    estimate PA systolic pressure.  - Inferior vena cava: The vessel was normal in size. The    respirophasic diameter changes were in the normal range (>= 50%),    consistent with normal central venous pressure.    Impressions:  - Normal LV size with severe LV hypertrophy. EF 60-65%. Moderate    LAE. Normal RV size and systolic function. No significant    valvular abnormalities. Cardiac amyloidosis is a consideration.    EKG:  EKG is personally reviewed. 06/06/2023:  Not ordered. 01/11/2021: Not ordered.  Recent Labs: 05/15/2023: B Natriuretic Peptide 240.7 05/16/2023: ALT 14; Magnesium 2.4 05/20/2023: Hemoglobin 9.0; Platelets 330 05/29/2023: BUN 33; Creatinine, Ser 1.99; Potassium 4.5; Sodium 142   Recent Lipid Panel    Component Value Date/Time   CHOL 202 (H) 09/21/2022 1303   TRIG 109 09/21/2022 1303   HDL 73 09/21/2022 1303   CHOLHDL 2.8 09/21/2022 1303   CHOLHDL 4.9 05/13/2013 0437   VLDL 20 05/13/2013 0437   LDLCALC 110 (H) 09/21/2022 1303    Physical Exam:    VS:  BP (!) 140/68 (BP Location: Left Arm, Patient Position: Sitting, Cuff Size: Large)   Pulse 68   Ht 5\' 8"  (1.727 m)   Wt 215 lb (97.5 kg)   SpO2 99%   BMI 32.69 kg/m  , BMI Body mass index is 32.69 kg/m. GENERAL:  Well appearing; ambulating independently in wheelchair HEENT: Pupils equal round and reactive, fundi not visualized, oral mucosa unremarkable NECK:  No jugular venous distention, waveform within normal limits, carotid upstroke brisk and symmetric, no bruits, no thyromegaly LYMPHATICS:  No cervical adenopathy LUNGS:  Clear to auscultation bilaterally HEART:  RRR.  PMI not displaced or sustained,S1 and S2 within normal limits, no S3, no S4, no clicks, no rubs, no murmurs ABD:  Flat, positive bowel sounds normal in frequency in pitch, no bruits, no rebound, no guarding, no midline pulsatile mass, no hepatomegaly, no splenomegaly EXT:  2 plus pulses throughout, + left LE edema, right LE in cast, no cyanosis no clubbing SKIN:  No rashes no nodules NEURO:  Cranial nerves II through XII grossly intact, motor grossly intact throughout PSYCH:  Cognitively intact, oriented to person place and  time  ASSESSMENT/PLAN:    #  Hypertension Blood pressure elevated at 144/78 despite medication regimen. Patient has been taking carvedilol 6.25mg  once daily instead of twice daily as prescribed. -Increase carvedilol to 12.5mg  twice daily. -Check blood pressure at home and follow up in a few months.  # Hypercholesterolemia LDL cholesterol elevated at 110 despite atorvastatin 40mg  daily. -Increase atorvastatin to 80mg  daily. -Recheck cholesterol levels in a few months.  # Adrenal Adenoma:  Patient has an adrenal adenoma producing excess aldosterone, contributing to hypertension. Patient is considering surgical removal but has not yet decided. -Continue to consider surgical removal of adrenal gland to potentially reduce need for antihypertensive medications.  # Recent Hospitalization Patient was recently hospitalized for congestive heart failure and diverticulitis.  Echo with preserved LVEF and grade 1 diastolic dysfunction.  -Continue current medications and follow up with primary care and nephrology as scheduled.  # Ankle Fracture Patient sustained a severe ankle fracture requiring two surgeries. Currently non-weight bearing and in a cast. -Continue to follow up with orthopedics as scheduled.  General Health Maintenance -Continue physical therapy as tolerated. -Consider smoking cessation for overall health improvement.        Screening for Secondary Hypertension:     01/11/2021    3:35 PM  Causes  Drugs/Herbals Screened  Sleep Apnea Screened     - Comments snores  Thyroid Disease Screened  Hyperaldosteronism Screened     - Comments 2018    Relevant Labs/Studies:    Latest Ref Rng & Units 05/29/2023    1:00 PM 05/20/2023    5:39 AM 05/19/2023    5:53 AM  Basic Labs  Sodium 134 - 144 mmol/L 142  136  134   Potassium 3.5 - 5.2 mmol/L 4.5  3.9  3.5   Creatinine 0.57 - 1.00 mg/dL 4.09  8.11  9.14        Latest Ref Rng & Units 01/11/2021    3:52 PM 02/27/2019   11:29 AM   Thyroid   TSH 0.450 - 4.500 uIU/mL 0.744  0.665        Latest Ref Rng & Units 01/11/2021    3:52 PM 02/27/2017   11:23 AM 02/27/2017   11:22 AM 02/27/2017   11:20 AM 02/27/2017   11:16 AM 02/27/2017   11:14 AM 02/27/2017   10:53 AM  Renin/Aldosterone   Aldosterone 0.0 - 30.0 ng/dL 78.2  95.6  21.3  08.6  <1.0  5,161.8  1.4   Renin 0.167 - 5.380 ng/mL/hr 1.455         Aldos/Renin Ratio 0.0 - 30.0 7.3                 Latest Ref Rng & Units 02/27/2017   11:23 AM 02/27/2017   11:22 AM 02/27/2017   11:20 AM 02/27/2017   11:16 AM 02/27/2017   11:14 AM 02/27/2017   10:53 AM 02/27/2017   10:40 AM  Cortisol  Cortisol  ug/dL 57.8  46.9  62.9  52.8  >100.0  5.0  23.6         Disposition:    FU with Advanced HTN clinic in 2 months.  Medication Adjustments/Labs and Tests Ordered: Current medicines are reviewed at length with the patient today.  Concerns regarding medicines are outlined above.   Orders Placed This Encounter  Procedures   Lipid panel   Comprehensive metabolic panel   Meds ordered this encounter  Medications   atorvastatin (LIPITOR) 80 MG tablet    Sig: Take 1 tablet (80 mg total) by mouth daily.  Dispense:  90 tablet    Refill:  3    NEW DOSE, D/C 40 MG RX   carvedilol (COREG) 12.5 MG tablet    Sig: Take 1 tablet (12.5 mg total) by mouth 2 (two) times daily with a meal.    Dispense:  180 tablet    Refill:  3    NEW DOSE, D/C 6.25 MG RX   I,Mathew Stumpf,acting as a scribe for Chilton Si, MD.,have documented all relevant documentation on the behalf of Chilton Si, MD,as directed by  Chilton Si, MD while in the presence of Chilton Si, MD.  I, Nur Krasinski C. Duke Salvia, MD have reviewed all documentation for this visit.  The documentation of the exam, diagnosis, procedures, and orders on 06/06/2023 are all accurate and complete.  Signed, Chilton Si, MD  06/06/2023 1:10 PM    [facility]

## 2023-06-06 NOTE — Progress Notes (Deleted)
Advanced Hypertension Clinic Initial Assessment:    Date:  06/06/2023   ID:  [name], DOB [birth year], MRN [id number]  PCP:  Summer Felts, FNP  Cardiologist:  Summer Si, MD  Nephrologist: Dr. Thedore Chavez, [facility]  Referring MD: Summer Felts, FNP   CC: Hypertension  History of Present Illness:    [name] is a 62 y.o. female with a hx of anemia, chronic diastolic heart failure, CKD 4, chronic bronchitis, GERD, hypertension, hypokalemia, venous stasis ulcers, and tobacco use, here for follow up.  She first established care in the hypertension clinic 01/11/2021.  She was seen in the ED 11/2020 with a blood pressure of 213/88. She had been drinking heavily and was experiencing palpitations. She previously had an echo 08/2018 with LVEF 60-65% and grade 1 diastolic dysfunction. She had severe LVH. When she last saw her PCP 10/2020, her blood pressure was uncontrolled and she was referred to cardiology.  She reported fatigued, and believed this is due to Norvasc. She has had hypertension since she was in her 28's. In 2018 she was noted to have an adrenal adenoma.  Labs at that time were consistent with hyperaldosteronism.  She was referred to Dr. Othella Chavez at [facility].  She was seen at [facility] and underwent adrenal vein sampling 02/2021 which was consistent with hypersecretion of aldosterone on the right.  She subsequently laprascopic Chavez adrenalectomy 05/2021.    Previous antihypertensives: Lisinopril   Past Medical History:  Diagnosis Date   Adrenal adenoma 01/11/2021   Anemia    CHF (congestive heart failure) (HCC)    Chronic bronchitis (HCC)    "probably once/yr" (05/12/2013)   Chronic kidney disease    Diastolic heart failure    Dyslipidemia    pt denies this hx on 05/12/2013   Exertional shortness of breath    GERD (gastroesophageal reflux disease)    Heart murmur    Hypertension    Hypokalemia    Left ventricular hypertrophy 01/11/2021   Venous stasis ulcers (HCC)      Past Surgical History:  Procedure Laterality Date   BIOPSY  02/23/2023   Procedure: BIOPSY;  Surgeon: [name]., MD;  Location: [facility] ENDOSCOPY;  Service: Gastroenterology;;   COLONOSCOPY N/A 02/23/2023   Procedure: COLONOSCOPY;  Surgeon: [name]., MD;  Location: [facility] ENDOSCOPY;  Service: Gastroenterology;  Laterality: N/A;   ESOPHAGOGASTRODUODENOSCOPY N/A 02/23/2023   Procedure: ESOPHAGOGASTRODUODENOSCOPY (EGD);  Surgeon: [name]., MD;  Location: [facility] ENDOSCOPY;  Service: Gastroenterology;  Laterality: N/A;   HARDWARE REMOVAL Right 05/08/2023   Procedure: HARDWARE REMOVAL MEDIAL AND LATERAL;  Surgeon: Summer Hart, MD;  Location: [facility] ORS;  Service: Orthopedics;  Laterality: Right;   HOT HEMOSTASIS N/A 02/23/2023   Procedure: HOT HEMOSTASIS (ARGON PLASMA COAGULATION/BICAP);  Surgeon: [name]., MD;  Location: [facility] ENDOSCOPY;  Service: Gastroenterology;  Laterality: N/A;   IR US GUIDE VASC ACCESS RIGHT  02/27/2017   IR VENOGRAM ADRENAL BI  02/27/2017   IR VENOGRAM HEPATIC WO HEMODYNAMIC EVALUATION  02/27/2017   IR VENOGRAM RENAL BI  02/27/2017   IR VENOUS SAMPLING  02/27/2017   IR VENOUS SAMPLING  02/27/2017   ORIF ANKLE FRACTURE Right 04/18/2023   Procedure: OPEN REDUCTION INTERNAL FIXATION (ORIF) ANKLE FRACTURE;  Surgeon: Summer Laura, MD;  Location: [facility] ORS;  Service: Orthopedics;  Laterality: Right;   ORIF ANKLE FRACTURE Right 05/08/2023   Procedure: REVISION OPEN REDUCTION INTERNAL FIXATION (ORIF) OF RIGHT TRIMALLEOLAR ANKLE FRACTURE, OPEN TREATMENT OF SYNDESMOSIS;  Surgeon: Summer Mikes  R, MD;  Location: [facility] ORS;  Service: Orthopedics;  Laterality: Right;   POLYPECTOMY  02/23/2023   Procedure: POLYPECTOMY;  Surgeon: [name]., MD;  Location: [facility] ENDOSCOPY;  Service: Gastroenterology;;   SYNDESMOSIS REPAIR Right 05/08/2023   Procedure: SYNDESMOSIS REPAIR;  Surgeon: Summer Hart, MD;  Location: [facility] ORS;  Service:  Orthopedics;  Laterality: Right;   VAGINAL HYSTERECTOMY  1990's    Current Medications: No outpatient medications have been marked as taking for the 06/06/23 encounter (Appointment) with Summer Si, MD.     Allergies:   Lisinopril   Social History   Socioeconomic History   Marital status: Single    Spouse name: Not on file   Number of children: Not on file   Years of education: Not on file   Highest education level: Not on file  Occupational History   Occupation: disability  Tobacco Use   Smoking status: Every Day    Current packs/day: 0.50    Average packs/day: 0.5 packs/day for 10.0 years (5.0 ttl pk-yrs)    Types: Cigarettes   Smokeless tobacco: Never   Tobacco comments:    trying to cut back, 3/29 - only had one cigarette today, average 4 a day  Vaping Use   Vaping status: Never Used  Substance and Sexual Activity   Alcohol use: Not Currently   Drug use: Not Currently    Types: Cocaine    Comment: 05/12/2013 "tried it a few days ago; didn't like it"   Sexual activity: Not Currently  Other Topics Concern   Not on file  Social History Narrative   Not on file   Social Determinants of Health   Financial Resource Strain: High Risk (11/06/2022)   Overall Financial Resource Strain (CARDIA)    Difficulty of Paying Living Expenses: Very hard  Food Insecurity: No Food Insecurity (05/15/2023)   Hunger Vital Sign    Worried About Running Out of Food in the Last Year: Never true    Ran Out of Food in the Last Year: Never true  Transportation Needs: No Transportation Needs (05/15/2023)   PRAPARE - Administrator, Civil Service (Medical): No    Lack of Transportation (Non-Medical): No  Physical Activity: Insufficiently Active (11/06/2022)   Exercise Vital Sign    Days of Exercise per Week: 5 days    Minutes of Exercise per Session: 20 min  Stress: No Stress Concern Present (11/06/2022)   [employer] of Occupational Health - Occupational Stress  Questionnaire    Feeling of Stress : Only a little  Social Connections: Moderately Isolated (11/06/2022)   Social Connection and Isolation Panel [NHANES]    Frequency of Communication with Friends and Family: Twice a week    Frequency of Social Gatherings with Friends and Family: Once a week    Attends Religious Services: More than 4 times per year    Active Member of [employer] or Organizations: No    Attends Banker Meetings: Never    Marital Status: Never married     Family History: The patient's family history includes Diabetes in her sister and sister; Heart attack in her father; Hypertension in her brother, mother, and sister; Kidney failure in her mother.  ROS:   Please see the history of present illness.    (+) Fatigue (+) LE edema (+) Snores All other systems reviewed and are negative.  EKGs/Labs/Other Studies Reviewed:    EKG:   01/11/2021: EKG is not ordered today.  Recent Labs: 05/15/2023: B  Natriuretic Peptide 240.7 05/16/2023: ALT 14; Magnesium 2.4 05/20/2023: Hemoglobin 9.0; Platelets 330 05/29/2023: BUN 33; Creatinine, Ser 1.99; Potassium 4.5; Sodium 142   Recent Lipid Panel    Component Value Date/Time   CHOL 202 (H) 09/21/2022 1303   TRIG 109 09/21/2022 1303   HDL 73 09/21/2022 1303   CHOLHDL 2.8 09/21/2022 1303   CHOLHDL 4.9 05/13/2013 0437   VLDL 20 05/13/2013 0437   LDLCALC 110 (H) 09/21/2022 1303   US Arterial Seg Multiple LE 11/05/2020: Normal exam. No evidence of hemodynamically significant peripheral arterial disease.  Echo TTE 09/06/2018: - Left ventricle: The cavity size was normal. Wall thickness was    increased in a pattern of severe LVH. Systolic function was    normal. The estimated ejection fraction was in the range of 60%    to 65%. Wall motion was normal; there were no regional wall    motion abnormalities. Doppler parameters are consistent with    abnormal left ventricular relaxation (grade 1 diastolic    dysfunction).  -  Aortic valve: There was no stenosis. There was trivial    regurgitation.  - Mitral valve: There was trivial regurgitation.  - Left atrium: The atrium was moderately dilated.  - Right ventricle: The cavity size was normal. Systolic function    was normal.  - Right atrium: The atrium was mildly dilated.  - Pulmonary arteries: No complete TR doppler jet so unable to    estimate PA systolic pressure.  - Inferior vena cava: The vessel was normal in size. The    respirophasic diameter changes were in the normal range (>= 50%),    consistent with normal central venous pressure.   Impressions:  - Normal LV size with severe LV hypertrophy. EF 60-65%. Moderate    LAE. Normal RV size and systolic function. No significant    valvular abnormalities. Cardiac amyloidosis is a consideration.    Physical Exam:   VS:  There were no vitals taken for this visit. , BMI There is no height or weight on file to calculate BMI. GENERAL:  Well appearing HEENT: Pupils equal round and reactive, fundi not visualized, oral mucosa unremarkable NECK:  No jugular venous distention, waveform within normal limits, carotid upstroke brisk and symmetric, no bruits, no thyromegaly LYMPHATICS:  No cervical adenopathy LUNGS:  Clear to auscultation bilaterally HEART:  RRR.  PMI not displaced or sustained,S1 and S2 within normal limits, no S3, no S4, no clicks, no rubs, no murmurs ABD:  Flat, positive bowel sounds normal in frequency in pitch, no bruits, no rebound, no guarding, no midline pulsatile mass, no hepatomegaly, no splenomegaly EXT:  2 plus pulses throughout, no edema, no cyanosis no clubbing SKIN:  No rashes no nodules NEURO:  Cranial nerves II through XII grossly intact, motor grossly intact throughout PSYCH:  Cognitively intact, oriented to person place and time   ASSESSMENT/PLAN:    No problem-specific Assessment & Plan notes found for this encounter.   Screening for Secondary Hypertension:     01/11/2021     3:35 PM  Causes  Drugs/Herbals Screened  Sleep Apnea Screened     - Comments snores  Thyroid Disease Screened  Hyperaldosteronism Screened     - Comments 2018    Relevant Labs/Studies:    Latest Ref Rng & Units 05/29/2023    1:00 PM 05/20/2023    5:39 AM 05/19/2023    5:53 AM  Basic Labs  Sodium 134 - 144 mmol/L 142  136  134  Potassium 3.5 - 5.2 mmol/L 4.5  3.9  3.5   Creatinine 0.57 - 1.00 mg/dL 8.11  9.14  7.82        Latest Ref Rng & Units 01/11/2021    3:52 PM 02/27/2019   11:29 AM  Thyroid   TSH 0.450 - 4.500 uIU/mL 0.744  0.665        Latest Ref Rng & Units 01/11/2021    3:52 PM 02/27/2017   11:23 AM 02/27/2017   11:22 AM 02/27/2017   11:20 AM 02/27/2017   11:16 AM 02/27/2017   11:14 AM 02/27/2017   10:53 AM  Renin/Aldosterone   Aldosterone 0.0 - 30.0 ng/dL 95.6  21.3  08.6  57.8  <1.0  5,161.8  1.4   Renin 0.167 - 5.380 ng/mL/hr 1.455         Aldos/Renin Ratio 0.0 - 30.0 7.3                 Latest Ref Rng & Units 02/27/2017   11:23 AM 02/27/2017   11:22 AM 02/27/2017   11:20 AM 02/27/2017   11:16 AM 02/27/2017   11:14 AM 02/27/2017   10:53 AM 02/27/2017   10:40 AM  Cortisol  Cortisol  ug/dL 46.9  62.9  52.8  41.3  >100.0  5.0  23.6          Disposition:    FU with MD in 6 months.   Medication Adjustments/Labs and Tests Ordered: Current medicines are reviewed at length with the patient today.  Concerns regarding medicines are outlined above.  No orders of the defined types were placed in this encounter.  No orders of the defined types were placed in this encounter.   I,Mathew Stumpf,acting as a Neurosurgeon for Summer Si, MD.,have documented all relevant documentation on the behalf of Summer Si, MD,as directed by  Summer Si, MD while in the presence of Summer Si, MD.  I, Derrious Bologna C. Duke Salvia, MD have reviewed all documentation for this visit.  The documentation of the exam, diagnosis, procedures, and orders on 06/06/2023 are all  accurate and complete.   Signed, Summer Si, MD  06/06/2023 8:24 AM    [facility]

## 2023-06-06 NOTE — Patient Instructions (Signed)
Medication Instructions:  INCREASE YOUR ATORVASTATIN TO 80 MG DAILY   INCREASE YOUR CARVEDILOL TO 12.5 MG TWICE A DAY   Labwork: FASTING LP/CMET 1 WEEK PRIOR TO FOLLOW UP   Testing/Procedures: NONE  Follow-Up: 2 MONTHS   Any Other Special Instructions Will Be Listed Below (If Applicable).     If you need a refill on your cardiac medications before your next appointment, please call your pharmacy.

## 2023-06-10 DIAGNOSIS — E66811 Obesity, class 1: Secondary | ICD-10-CM | POA: Insufficient documentation

## 2023-06-10 DIAGNOSIS — Z2821 Immunization not carried out because of patient refusal: Secondary | ICD-10-CM | POA: Insufficient documentation

## 2023-06-10 DIAGNOSIS — Z23 Encounter for immunization: Secondary | ICD-10-CM | POA: Insufficient documentation

## 2023-06-10 DIAGNOSIS — K5792 Diverticulitis of intestine, part unspecified, without perforation or abscess without bleeding: Secondary | ICD-10-CM | POA: Insufficient documentation

## 2023-06-10 DIAGNOSIS — G4701 Insomnia due to medical condition: Secondary | ICD-10-CM | POA: Insufficient documentation

## 2023-06-10 NOTE — Assessment & Plan Note (Signed)
She is to try melatonin first. She has had a change in her schedule which may be affecting her sleep.

## 2023-06-10 NOTE — Assessment & Plan Note (Signed)
Advised to avoid foods that are spicy and has seeds

## 2023-06-10 NOTE — Assessment & Plan Note (Addendum)
TCM Performed. A member of the clinical team spoke with the patient upon dischare. Discharge summary was reviewed in full detail during the visit. Meds reconciled and compared to discharge meds. Medication list is updated and reviewed with the patient.  Greater than 50% face to face time was spent in counseling an coordination of care.  All questions were answered to the satisfaction of the patient.   She is feeling better and has an upcoming appt with Cardiology.  She does not feel she needs Home Health at this time.

## 2023-06-10 NOTE — Assessment & Plan Note (Addendum)
She is encouraged to strive for BMI less than 30 to decrease cardiac risk. Advised to aim for at least 150 minutes of exercise per week as tolerated may need to do chair exercises due to her fracture.

## 2023-06-10 NOTE — Assessment & Plan Note (Signed)
Declines shingrix, educated on disease process and is aware if he changes his mind to notify office  

## 2023-06-10 NOTE — Assessment & Plan Note (Signed)
Influenza vaccine administered Encouraged to take Tylenol as needed for fever or muscle aches.

## 2023-06-11 ENCOUNTER — Other Ambulatory Visit: Payer: Self-pay | Admitting: Nurse Practitioner

## 2023-06-11 DIAGNOSIS — E2839 Other primary ovarian failure: Secondary | ICD-10-CM

## 2023-06-11 DIAGNOSIS — S82891G Other fracture of right lower leg, subsequent encounter for closed fracture with delayed healing: Secondary | ICD-10-CM

## 2023-06-13 DIAGNOSIS — D631 Anemia in chronic kidney disease: Secondary | ICD-10-CM | POA: Diagnosis not present

## 2023-06-13 DIAGNOSIS — I130 Hypertensive heart and chronic kidney disease with heart failure and stage 1 through stage 4 chronic kidney disease, or unspecified chronic kidney disease: Secondary | ICD-10-CM | POA: Diagnosis not present

## 2023-06-13 DIAGNOSIS — I872 Venous insufficiency (chronic) (peripheral): Secondary | ICD-10-CM | POA: Diagnosis not present

## 2023-06-13 DIAGNOSIS — S82891D Other fracture of right lower leg, subsequent encounter for closed fracture with routine healing: Secondary | ICD-10-CM | POA: Diagnosis not present

## 2023-06-13 DIAGNOSIS — N184 Chronic kidney disease, stage 4 (severe): Secondary | ICD-10-CM | POA: Diagnosis not present

## 2023-06-13 DIAGNOSIS — I5033 Acute on chronic diastolic (congestive) heart failure: Secondary | ICD-10-CM | POA: Diagnosis not present

## 2023-06-18 ENCOUNTER — Telehealth: Payer: Self-pay

## 2023-06-18 NOTE — Telephone Encounter (Signed)
Entered in error

## 2023-06-19 ENCOUNTER — Telehealth: Payer: Self-pay | Admitting: *Deleted

## 2023-06-19 ENCOUNTER — Other Ambulatory Visit: Payer: Self-pay | Admitting: *Deleted

## 2023-06-19 NOTE — Patient Outreach (Signed)
Care Management   Follow Up Note   06/19/2023 Name: [name] MRN: [id number] DOB: 08/16/1961   Referred by: [name], FNP Reason for referral : Care Coordination   An unsuccessful telephone outreach was attempted today. The patient was referred to the case management team for assistance with care management and care coordination.   Follow Up Plan: Telephone follow up appointment with care management team member scheduled for: upon care guide rescheduling.  Irving Shows [facility], BSN [place]/ Ambulatory Care Management [phone]

## 2023-06-25 DIAGNOSIS — I5033 Acute on chronic diastolic (congestive) heart failure: Secondary | ICD-10-CM | POA: Insufficient documentation

## 2023-06-25 NOTE — Assessment & Plan Note (Signed)
TCM Performed. A member of the clinical team spoke with the patient upon dischare. Discharge summary was reviewed in full detail during the visit. Meds reconciled and compared to discharge meds. Medication list is updated and reviewed with the patient.  Greater than 50% face to face time was spent in counseling an coordination of care.  All questions were answered to the satisfaction of the patient.

## 2023-06-25 NOTE — Assessment & Plan Note (Signed)
Blood pressure is better controlled, continue current medications and f/u with Cardiology

## 2023-07-03 NOTE — Progress Notes (Signed)
07/04/2023 Name: [name] MRN: [id number] DOB: [birth year]  Chief Complaint  Patient presents with   Medication Management   Hyperlipidemia    [name] is a 62 y.o. year old female who presented for a telephone visit.   They were referred to the pharmacist by their PCP for assistance in managing hypertension.    Subjective:  Care Team: Primary Care Provider: [name], FNP ; Next Scheduled Visit: 08/23/2023  Medication Access/Adherence  Current Pharmacy:  [id number] [place] - [address] AT [facility] [address] [place] [address] Phone: [phone] Fax: [phone]   Hypertension/Chronic Kidney Disease:  Current BP medications: amlodipine 10 mg daily, carvedilol 12.5 mg twice daily, Imdur 60 mg daily, hydralazine 50 mg three times daily, spironolactone 25 mg daily, Lasix 40 mg daily  Current CKD Medications: Farxiga 10 mg daily  Patient had appt with nephrologist, Dr. Thedore Mins, on October 24th. Dr. Thedore Mins called patient after collecting labs and patient reported no changes were made in medication regimen. Cardiologist recently increased carvedilol from 6.25 mg BID to 12.5 mg BID.   Patient does not have a validated, automated, upper arm home BP cuff. Patient is using a wrist home BP cuff.  Current blood pressure readings readings: 160/80 mmHg, 147/100 mmHg, 171/80 mmHg  Reports losing vision for about an hour yesterday. She feels better and vision resumed. Advised patient to keep an eye on this and call PCP if this happens again and/or seek emergency services.   Hyperlipidemia/ASCVD Risk Reduction  Current lipid lowering medications: atorvastatin 80 mg daily  Cardiologist recently increased atorvastatin 40 mg to 80 mg.    Antiplatelet regimen: ASA 81 mg twice daily  ASCVD History: none Family History: heart attack (father), HTN (mother and father), diabetes (father) Risk Factors: HTN, HLD, CKD   The 10-year  ASCVD risk score (Arnett DK, et al., 2019) is: 16.6%   Values used to calculate the score:     Age: 16 years     Sex: Female     Is Non-Hispanic African American: Yes     Diabetic: No     Tobacco smoker: Yes     Systolic Blood Pressure: 140 mmHg     Is BP treated: Yes     HDL Cholesterol: 73 mg/dL     Total Cholesterol: 202 mg/dL    Objective:  Lab Results  Component Value Date   HGBA1C 4.9 08/23/2021    Lab Results  Component Value Date   CREATININE 1.99 (H) 05/29/2023   BUN 33 (H) 05/29/2023   NA 142 05/29/2023   K 4.5 05/29/2023   CL 104 05/29/2023   CO2 21 05/29/2023    Lab Results  Component Value Date   CHOL 202 (H) 09/21/2022   HDL 73 09/21/2022   LDLCALC 110 (H) 09/21/2022   TRIG 109 09/21/2022   CHOLHDL 2.8 09/21/2022    Medications Reviewed Today     Reviewed by Roslyn Smiling, [facility] (Pharmacist) on 07/04/23 at 0930  Med List Status: <None>   Medication Order Taking? Sig Documenting Provider Last Dose Status Informant  acetaminophen (TYLENOL) 325 MG tablet [id number] Yes Take 650 mg by mouth every 6 (six) hours as needed for mild pain or headache. [provider] Taking Active Child, Pharmacy Records  amLODipine (NORVASC) 10 MG tablet [id number] Yes Take 1 tablet (10 mg total) by mouth daily. Ellender Hose, NP Taking Active Child, Pharmacy Records  Med Note Lenoria Farrier   Tue Apr 10, 2023  2:30 PM)    aspirin EC 81 MG tablet [id number] Yes Take 1 tablet (81 mg total) by mouth 2 (two) times daily. For blood clot prevention. Take for 30 days after surgery. Swallow whole. [place], Jesse J, PA-C Taking Active Child, Pharmacy Records  atorvastatin (LIPITOR) 80 MG tablet [id number] Yes Take 1 tablet (80 mg total) by mouth daily. Chilton Si, MD Taking Active   carvedilol (COREG) 12.5 MG tablet [id number] Yes Take 1 tablet (12.5 mg total) by mouth 2 (two) times daily with a meal. Chilton Si, MD Taking Active   FARXIGA 10 MG TABS tablet  [id number] Yes Take 1 tablet (10 mg total) by mouth daily. Ellender Hose, NP Taking Active Child, Pharmacy Records  ferrous gluconate [facility]) 324 MG tablet [id number] Yes Take 1 tablet (324 mg total) by mouth daily with breakfast. Rai, Delene Ruffini, MD Taking Active Child, Pharmacy Records  furosemide (LASIX) 40 MG tablet [id number] Yes Take 1 tablet (40 mg total) by mouth daily. Rodolph Bong, MD Taking Active   hydrALAZINE (APRESOLINE) 50 MG tablet [id number] Yes Take 1 tablet (50 mg total) by mouth 3 (three) times daily. Cathren Harsh, MD Taking Active Child, Pharmacy Records  isosorbide mononitrate (IMDUR) 60 MG 24 hr tablet [id number] Yes Take 1 tablet (60 mg total) by mouth daily. Cathren Harsh, MD Taking Active Child, Pharmacy Records  loperamide (IMODIUM) 2 MG capsule [id number] Yes Take 1 capsule (2 mg total) by mouth as needed for diarrhea or loose stools. Rodolph Bong, MD Taking Active   melatonin 3 MG TABS tablet [id number] Yes Take 1 tablet (3 mg total) by mouth at bedtime as needed. [name], FNP Taking Active   methocarbamol (ROBAXIN) 500 MG tablet [id number] Yes Take 500 mg by mouth every 6 (six) hours as needed. [provider] Taking Active Child, Pharmacy Records  oxyCODONE (ROXICODONE) 5 MG immediate release tablet [id number] No Take 1 tablet (5 mg total) by mouth every 4 (four) hours as needed for severe pain.  Patient not taking: Reported on 07/04/2023   Rodolph Bong, MD Not Taking Active   polyethylene glycol (MIRALAX / GLYCOLAX) 17 g packet [id number] Yes Take 17 g by mouth daily as needed for mild constipation or moderate constipation. [provider] Taking Active Child, Pharmacy Records  spironolactone (ALDACTONE) 25 MG tablet [id number] Yes TAKE 1 TABLET BY MOUTH DAILY [name], FNP Taking Active             Assessment/Plan:   Hypertension/Chronic Kidney Disease: - Currently uncontrolled most likely due to medication adherence and  underlying adrenal adenoma. BP goal of <130/80 mmHg.  - High home blood pressure readings are likely not as accurate using wrist blood pressure cuff than if using arm blood pressure cuff. Will set aside an arm BP cuff for patient to pick up - Reviewed appropriate blood pressure monitoring technique and reviewed goal blood pressure. Recommended to check home blood pressure and heart rate daily. The patient has a diagnosis of hypertension and it is medically necessary for them to have access to a home device to monitor blood pressure. The patient does not have readily available insurance access to a device and cannot afford to purchase a device at this time. The patient has been counseled that they do not need to continue to receive services from [facility] for receive a device. The patient will be given a device free of charge.  -  Recommend to continue amlodipine 10 mg daily, carvedilol 12.5 mg twice daily, Imdur 60 mg daily, hydralazine 50 mg three times daily, spironolactone 25 mg daily, Lasix 40 mg daily    Hyperlipidemia/ASCVD Risk Reduction: - Currently uncontrolled based on most recent lipid in February 2024. LDL was 110 (goal <100).  - Recommend to repeat lipid panel at next PCP appointment in January   - Recommend to continue atorvastatin 80 mg daily and 30-day course of ASA 81 mg for post-surgery blood clot prevention   Follow Up Plan: 08/08/2023 with pharmacist via telephone   Roslyn Smiling, PharmD PGY1 Pharmacy Resident 07/04/2023 9:45 AM   I have reviewed the pharmacist's encounter and agree with their documentation.   Catie Eppie Gibson, PharmD, BCACP, CPP [facility] [phone]

## 2023-07-04 ENCOUNTER — Other Ambulatory Visit: Payer: 59 | Admitting: Pharmacist

## 2023-07-04 DIAGNOSIS — I10 Essential (primary) hypertension: Secondary | ICD-10-CM

## 2023-07-04 DIAGNOSIS — E782 Mixed hyperlipidemia: Secondary | ICD-10-CM

## 2023-07-04 NOTE — Patient Instructions (Addendum)
Sugar,   It was great talking to you today!  Take atorvastatin 80 mg daily - either two tablets of 40 mg or, when you finish that bottle, 1 tablet of 80 mg.   Take carvedilol 12.5 mg TWICE DAILY.   Check your blood pressure once daily, and any time you have concerning symptoms like headache, chest pain, dizziness, shortness of breath, or vision changes.   Our goal is less than 130/80.  To appropriately check your blood pressure, make sure you do the following:  1) Avoid caffeine, exercise, or tobacco products for 30 minutes before checking. Empty your bladder. 2) Sit with your back supported in a flat-backed chair. Rest your arm on something flat (arm of the chair, table, etc). 3) Sit still with your feet flat on the floor, resting, for at least 5 minutes.  4) Check your blood pressure. Take 1-2 readings.  5) Write down these readings and bring with you to any provider appointments.  Bring your home blood pressure machine with you to a provider's office for accuracy comparison at least once a year.   Make sure you take your blood pressure medications before you come to any office visit, even if you were asked to fast for labs.      Catie Eppie Gibson, PharmD, BCACP, CPP Clinical Pharmacist [facility] [phone]

## 2023-07-11 ENCOUNTER — Ambulatory Visit: Payer: Self-pay

## 2023-07-12 ENCOUNTER — Ambulatory Visit: Payer: Self-pay | Admitting: Licensed Clinical Social Worker

## 2023-07-12 NOTE — Patient Outreach (Signed)
  Care Coordination   Initial Visit Note   07/12/2023 Name: Jenica Szymkowiak MRN: [id number] DOB: 06-09-1961  [name] is a 62 y.o. year old female who sees [name], FNP for primary care. I spoke with  [name] by phone today.  What matters to the patients health and wellness today?  Housing resources     Goals Addressed             This Visit's Progress    Care Coordination Activities       Care Coordination Interventions: Patient stated that she is currently living with her daughter and has been there since August and moved out of the hotel room she was living in because she has a ankle fracture at work and the daughter moved her stuff into a storage and she moved in with her daughter. Patient stated that she is currently out of work and would like to go back, and is getting Forensic psychologist comp and Tree surgeon.  Patient stated that it is a lot being at her daughters because she knows that her daughter and the family need their own space and my try to go to [employer] or [place] to try to get another room. She has about $800 saved up for her to rent a room. While staying with her daughter she is assisting her daughter with bill. Patient is unsure if she can apply for Section 8 again because she had an unresolved issue/bill about 4 years ago but she is willing to contact them to check her status SW educated the patient about low income housing and will mail the patient a list of resources for housing SW will follow up with patient on 07/26/2023 at 2:00 pm         SDOH assessments and interventions completed:  Yes  SDOH Interventions Today    Flowsheet Row Most Recent Value  SDOH Interventions   Food Insecurity Interventions Intervention Not Indicated  Housing Interventions Other (Comment)  [Patient is currently living with her daughter and may need to move soon]  Transportation Interventions Intervention Not Indicated  Utilities Interventions  Intervention Not Indicated        Care Coordination Interventions:  Yes, provided  Interventions Today    Flowsheet Row Most Recent Value  General Interventions   General Interventions Discussed/Reviewed General Interventions Discussed, [employer] is currently living with her daughter and may need to move]        Follow up plan: Follow up call scheduled for 07/26/2023 at 2:00 pm    Encounter Outcome:  Patient Visit Completed   Jeanie Cooks, PhD [facility], [facility] Social Worker Direct Dial: [phone]  Fax: [phone]

## 2023-07-12 NOTE — Patient Instructions (Signed)
Visit Information  Thank you for taking time to visit with me today. Please don't hesitate to contact me if I can be of assistance to you.   Following are the goals we discussed today:   Goals Addressed             This Visit's Progress    Care Coordination Activities       Care Coordination Interventions: Patient stated that she is currently living with her daughter and has been there since August and moved out of the hotel room she was living in because she has a ankle fracture at work and the daughter moved her stuff into a storage and she moved in with her daughter. Patient stated that she is currently out of work and would like to go back, and is getting Forensic psychologist comp and Tree surgeon.  Patient stated that it is a lot being at her daughters because she knows that her daughter and the family need their own space and my try to go to [employer] or [place] to try to get another room. She has about $800 saved up for her to rent a room. While staying with her daughter she is assisting her daughter with bill. Patient is unsure if she can apply for Section 8 again because she had an unresolved issue/bill about 4 years ago but she is willing to contact them to check her status SW educated the patient about low income housing and will mail the patient a list of resources for housing SW will follow up with patient on 07/26/2023 at 2:00 pm         Our next appointment is by telephone on 07/26/2023 at 2:00  Please call the care guide team at [phone] if you need to cancel or reschedule your appointment.   If you are experiencing a Mental Health or Behavioral Health Crisis or need someone to talk to, please call the Suicide and Crisis Lifeline: 988 go to [facility] [address] [place] [id number]) call 911  The patient verbalized understanding of instructions, educational materials, and care plan provided today and DECLINED offer to  receive copy of patient instructions, educational materials, and care plan.   Jeanie Cooks, PhD [facility], [facility] Social Worker Direct Dial: [phone]  Fax: [phone]

## 2023-07-12 NOTE — Patient Outreach (Signed)
  Care Coordination   Follow Up Visit Note   07/11/2023 Name: [name] MRN: [id number] DOB: 1960-11-07  [name] is a 62 y.o. year old female who sees [name], FNP for primary care. I spoke with  [name] by phone today.  What matters to the patients health and wellness today?  Patient would like to get better control over her BP and regain her full mobility.     Goals Addressed             This Visit's Progress    To obtain consistent BP control   On track    Care Coordination Interventions: Evaluation of current treatment plan related to hypertension self management and patient's adherence to plan as established by provider Provided education to patient re: stroke prevention, s/s of heart attack and stroke Reviewed medications with patient and discussed importance of compliance Advised patient, providing education and rationale, to monitor blood pressure daily and record, calling PCP for findings outside established parameters Last practice recorded BP readings:  BP Readings from Last 3 Encounters:  06/06/23 (!) 140/68  05/29/23 116/60  05/20/23 (!) 146/76   Most recent eGFR/CrCl:  Lab Results  Component Value Date   EGFR 28 (L) 05/29/2023    No components found for: "CRCL"        To recover from ankle fracture       Care Coordination Interventions: Evaluation of current treatment plan related to right ankle fracture and patient's adherence to plan as established by provider Review of patient status, including review of consultant's reports, relevant laboratory and other test results, and medications completed Determined patient is now weight bearing as tolerated wearing an orthotic show as directed by her orthopedic MD Discussed patient continues to make progress toward healing from her right ankle fracture Patient will continue to follow weight bearing instructions as tolerated and as directed by her doctor  Patient will use fall  precautions and notify her doctor of any/all falls that may occur Patient will notify her doctor promptly of new symptoms or concerns       Interventions Today    Flowsheet Row Most Recent Value  Chronic Disease   Chronic disease during today's visit Hypertension (HTN), Other  [s/p right ankle fracture]  General Interventions   General Interventions Discussed/Reviewed General Interventions Discussed, General Interventions Reviewed, Doctor Visits, Durable Medical Equipment (DME), Communication with  Durable Medical Equipment (DME) Wheelchair, Environmental consultant, Other  [scooter]  Wheelchair Motorized  Communication with Social Work  Remigio Eisenmenger BSW]  Exercise Interventions   Exercise Discussed/Reviewed Physical Activity  Education Interventions   Education Provided Provided Education  Provided Verbal Education On Medication, When to see the doctor  Pharmacy Interventions   Pharmacy Dicussed/Reviewed Pharmacy Topics Reviewed, Pharmacy Topics Discussed  Safety Interventions   Safety Discussed/Reviewed Safety Discussed, Safety Reviewed, Fall Risk, Home Safety  Home Safety Assistive Devices          SDOH assessments and interventions completed:  Yes  SDOH Interventions Today    Flowsheet Row Most Recent Value  SDOH Interventions   Housing Interventions AMB Referral  [SW referral sent to Remigio Eisenmenger BSW]        Care Coordination Interventions:  Yes, provided   Follow up plan: Referral made to Remigio Eisenmenger BSW to assist with resources for housing  Follow up call scheduled for 08/09/23 @12 :00 PM    Encounter Outcome:  Patient Visit Completed

## 2023-07-20 DIAGNOSIS — D631 Anemia in chronic kidney disease: Secondary | ICD-10-CM | POA: Diagnosis not present

## 2023-07-20 DIAGNOSIS — I130 Hypertensive heart and chronic kidney disease with heart failure and stage 1 through stage 4 chronic kidney disease, or unspecified chronic kidney disease: Secondary | ICD-10-CM | POA: Diagnosis not present

## 2023-07-20 DIAGNOSIS — N184 Chronic kidney disease, stage 4 (severe): Secondary | ICD-10-CM | POA: Diagnosis not present

## 2023-07-20 DIAGNOSIS — I872 Venous insufficiency (chronic) (peripheral): Secondary | ICD-10-CM | POA: Diagnosis not present

## 2023-07-20 DIAGNOSIS — S82891D Other fracture of right lower leg, subsequent encounter for closed fracture with routine healing: Secondary | ICD-10-CM | POA: Diagnosis not present

## 2023-07-20 DIAGNOSIS — I5033 Acute on chronic diastolic (congestive) heart failure: Secondary | ICD-10-CM | POA: Diagnosis not present

## 2023-07-25 ENCOUNTER — Emergency Department (HOSPITAL_COMMUNITY): Payer: 59

## 2023-07-25 ENCOUNTER — Other Ambulatory Visit: Payer: Self-pay

## 2023-07-25 ENCOUNTER — Encounter (HOSPITAL_COMMUNITY): Payer: Self-pay

## 2023-07-25 ENCOUNTER — Observation Stay (HOSPITAL_COMMUNITY)
Admission: EM | Admit: 2023-07-25 | Discharge: 2023-07-26 | Disposition: A | Discharge status: Home / Self Care | Payer: 59 | Attending: Internal Medicine | Admitting: Internal Medicine

## 2023-07-25 DIAGNOSIS — Z7984 Long term (current) use of oral hypoglycemic drugs: Secondary | ICD-10-CM | POA: Diagnosis not present

## 2023-07-25 DIAGNOSIS — I517 Cardiomegaly: Secondary | ICD-10-CM | POA: Diagnosis not present

## 2023-07-25 DIAGNOSIS — I130 Hypertensive heart and chronic kidney disease with heart failure and stage 1 through stage 4 chronic kidney disease, or unspecified chronic kidney disease: Secondary | ICD-10-CM | POA: Insufficient documentation

## 2023-07-25 DIAGNOSIS — I5033 Acute on chronic diastolic (congestive) heart failure: Principal | ICD-10-CM

## 2023-07-25 DIAGNOSIS — N184 Chronic kidney disease, stage 4 (severe): Secondary | ICD-10-CM | POA: Diagnosis not present

## 2023-07-25 DIAGNOSIS — Z79899 Other long term (current) drug therapy: Secondary | ICD-10-CM | POA: Insufficient documentation

## 2023-07-25 DIAGNOSIS — I509 Heart failure, unspecified: Principal | ICD-10-CM

## 2023-07-25 DIAGNOSIS — R0602 Shortness of breath: Secondary | ICD-10-CM | POA: Diagnosis not present

## 2023-07-25 DIAGNOSIS — R0789 Other chest pain: Secondary | ICD-10-CM

## 2023-07-25 DIAGNOSIS — E785 Hyperlipidemia, unspecified: Secondary | ICD-10-CM | POA: Diagnosis present

## 2023-07-25 DIAGNOSIS — F17210 Nicotine dependence, cigarettes, uncomplicated: Secondary | ICD-10-CM | POA: Diagnosis not present

## 2023-07-25 DIAGNOSIS — Z7982 Long term (current) use of aspirin: Secondary | ICD-10-CM | POA: Diagnosis not present

## 2023-07-25 DIAGNOSIS — I10 Essential (primary) hypertension: Secondary | ICD-10-CM | POA: Diagnosis present

## 2023-07-25 DIAGNOSIS — D638 Anemia in other chronic diseases classified elsewhere: Secondary | ICD-10-CM | POA: Diagnosis present

## 2023-07-25 DIAGNOSIS — I110 Hypertensive heart disease with heart failure: Secondary | ICD-10-CM | POA: Diagnosis not present

## 2023-07-25 DIAGNOSIS — R079 Chest pain, unspecified: Secondary | ICD-10-CM | POA: Diagnosis not present

## 2023-07-25 LAB — BASIC METABOLIC PANEL
Anion gap: 8 (ref 5–15)
BUN: 51 mg/dL — ABNORMAL HIGH (ref 8–23)
CO2: 23 mmol/L (ref 22–32)
Calcium: 9 mg/dL (ref 8.9–10.3)
Chloride: 110 mmol/L (ref 98–111)
Creatinine, Ser: 2.58 mg/dL — ABNORMAL HIGH (ref 0.44–1.00)
GFR, Estimated: 20 mL/min — ABNORMAL LOW (ref >60)
Glucose, Bld: 88 mg/dL (ref 70–99)
Potassium: 4.7 mmol/L (ref 3.5–5.1)
Sodium: 141 mmol/L (ref 135–145)

## 2023-07-25 LAB — CBC
HCT: 34.1 % — ABNORMAL LOW (ref 36.0–46.0)
Hemoglobin: 10.4 g/dL — ABNORMAL LOW (ref 12.0–15.0)
MCH: 28 pg (ref 26.0–34.0)
MCHC: 30.5 g/dL (ref 30.0–36.0)
MCV: 91.9 fL (ref 80.0–100.0)
Platelets: 265 10*3/uL (ref 150–400)
RBC: 3.71 MIL/uL — ABNORMAL LOW (ref 3.87–5.11)
RDW: 13.8 % (ref 11.5–15.5)
WBC: 6.3 10*3/uL (ref 4.0–10.5)
nRBC: 0 % (ref 0.0–0.2)

## 2023-07-25 LAB — HEPATIC FUNCTION PANEL
ALT: 21 U/L (ref 0–44)
AST: 17 U/L (ref 15–41)
Albumin: 3.9 g/dL (ref 3.5–5.0)
Alkaline Phosphatase: 71 U/L (ref 38–126)
Bilirubin, Direct: <0.1 mg/dL (ref 0.0–0.2)
Total Bilirubin: 0.6 mg/dL (ref <1.2)
Total Protein: 7.8 g/dL (ref 6.5–8.1)

## 2023-07-25 LAB — TROPONIN I (HIGH SENSITIVITY)
Troponin I (High Sensitivity): 21 ng/L — ABNORMAL HIGH (ref <18)
Troponin I (High Sensitivity): 22 ng/L — ABNORMAL HIGH (ref <18)

## 2023-07-25 LAB — BRAIN NATRIURETIC PEPTIDE: B Natriuretic Peptide: 240.9 pg/mL — ABNORMAL HIGH (ref 0.0–100.0)

## 2023-07-25 MED ORDER — SODIUM CHLORIDE 0.9% FLUSH
3.0000 mL | Freq: Two times a day (BID) | INTRAVENOUS | Status: DC
  Administered 2023-07-26: 3 mL via INTRAVENOUS

## 2023-07-25 MED ORDER — FUROSEMIDE 10 MG/ML IJ SOLN
40.0000 mg | Freq: Two times a day (BID) | INTRAMUSCULAR | Status: DC
Start: 2023-07-26 — End: 2023-07-26
  Administered 2023-07-26: 40 mg via INTRAVENOUS
  Filled 2023-07-25: qty 4

## 2023-07-25 MED ORDER — ONDANSETRON HCL 4 MG/2ML IJ SOLN: 4.0000 mg | Freq: Four times a day (QID) | INTRAMUSCULAR | Status: DC | PRN

## 2023-07-25 MED ORDER — HYDROMORPHONE HCL 1 MG/ML IJ SOLN
0.5000 mg | Freq: Once | INTRAMUSCULAR | Status: AC
  Administered 2023-07-25: 0.5 mg via INTRAVENOUS
  Filled 2023-07-25: qty 1

## 2023-07-25 MED ORDER — ATORVASTATIN CALCIUM 40 MG PO TABS
80.0000 mg | ORAL_TABLET | Freq: Every day | ORAL | Status: DC
  Administered 2023-07-26: 80 mg via ORAL
  Filled 2023-07-25: qty 2

## 2023-07-25 MED ORDER — FUROSEMIDE 10 MG/ML IJ SOLN
40.0000 mg | Freq: Once | INTRAMUSCULAR | Status: AC
  Administered 2023-07-25: 40 mg via INTRAVENOUS
  Filled 2023-07-25: qty 4

## 2023-07-25 MED ORDER — ONDANSETRON HCL 4 MG/2ML IJ SOLN
4.0000 mg | Freq: Once | INTRAMUSCULAR | Status: AC
  Administered 2023-07-25: 4 mg via INTRAVENOUS
  Filled 2023-07-25: qty 2

## 2023-07-25 MED ORDER — ACETAMINOPHEN 650 MG RE SUPP: 650.0000 mg | Freq: Four times a day (QID) | RECTAL | Status: DC | PRN

## 2023-07-25 MED ORDER — SENNOSIDES-DOCUSATE SODIUM 8.6-50 MG PO TABS: 1.0000 | ORAL_TABLET | Freq: Every evening | ORAL | Status: DC | PRN

## 2023-07-25 MED ORDER — CARVEDILOL 12.5 MG PO TABS
12.5000 mg | ORAL_TABLET | Freq: Once | ORAL | Status: AC
  Administered 2023-07-25: 12.5 mg via ORAL
  Filled 2023-07-25: qty 1

## 2023-07-25 MED ORDER — CYCLOBENZAPRINE HCL 10 MG PO TABS
10.0000 mg | ORAL_TABLET | Freq: Once | ORAL | Status: AC
  Administered 2023-07-25: 10 mg via ORAL
  Filled 2023-07-25: qty 1

## 2023-07-25 MED ORDER — HYDRALAZINE HCL 50 MG PO TABS
50.0000 mg | ORAL_TABLET | Freq: Three times a day (TID) | ORAL | Status: DC
  Administered 2023-07-26: 50 mg via ORAL
  Filled 2023-07-25: qty 1

## 2023-07-25 MED ORDER — BISACODYL 5 MG PO TBEC: 5.0000 mg | DELAYED_RELEASE_TABLET | Freq: Every day | ORAL | Status: DC | PRN

## 2023-07-25 MED ORDER — AMLODIPINE BESYLATE 5 MG PO TABS
10.0000 mg | ORAL_TABLET | Freq: Every day | ORAL | Status: DC
  Administered 2023-07-26: 10 mg via ORAL
  Filled 2023-07-25: qty 2

## 2023-07-25 MED ORDER — HYDRALAZINE HCL 25 MG PO TABS
50.0000 mg | ORAL_TABLET | Freq: Once | ORAL | Status: AC
  Administered 2023-07-25: 50 mg via ORAL
  Filled 2023-07-25: qty 2

## 2023-07-25 MED ORDER — METHOCARBAMOL 500 MG PO TABS: 500.0000 mg | ORAL_TABLET | Freq: Four times a day (QID) | ORAL | Status: DC | PRN

## 2023-07-25 MED ORDER — CARVEDILOL 12.5 MG PO TABS
12.5000 mg | ORAL_TABLET | Freq: Two times a day (BID) | ORAL | Status: DC
  Administered 2023-07-26: 12.5 mg via ORAL
  Filled 2023-07-25: qty 1

## 2023-07-25 MED ORDER — HEPARIN SODIUM (PORCINE) 5000 UNIT/ML IJ SOLN
5000.0000 [IU] | Freq: Three times a day (TID) | INTRAMUSCULAR | Status: DC
  Administered 2023-07-26: 5000 [IU] via SUBCUTANEOUS
  Filled 2023-07-25: qty 1

## 2023-07-25 MED ORDER — ONDANSETRON HCL 4 MG PO TABS: 4.0000 mg | ORAL_TABLET | Freq: Four times a day (QID) | ORAL | Status: DC | PRN

## 2023-07-25 MED ORDER — MELATONIN 3 MG PO TABS: 3.0000 mg | ORAL_TABLET | Freq: Every evening | ORAL | Status: DC | PRN

## 2023-07-25 MED ORDER — ACETAMINOPHEN 325 MG PO TABS
650.0000 mg | ORAL_TABLET | Freq: Four times a day (QID) | ORAL | Status: DC | PRN
  Administered 2023-07-26: 650 mg via ORAL
  Filled 2023-07-25: qty 2

## 2023-07-25 MED ORDER — ACETAMINOPHEN 500 MG PO TABS
1000.0000 mg | ORAL_TABLET | Freq: Once | ORAL | Status: AC
  Administered 2023-07-25: 1000 mg via ORAL
  Filled 2023-07-25: qty 2

## 2023-07-25 NOTE — ED Triage Notes (Signed)
4 days of feeling like the left side "is being pulled apart." Pain is increasing. Heart feels like it is beating too fast and there might be fluid on the chest.   Hx of congestive heart failure. Broke ankle in August

## 2023-07-25 NOTE — ED Notes (Signed)
Tech ambulated patient with pulse ox, pt reported feeling dizzy half way through the walk O2 stayed between 95-100%

## 2023-07-25 NOTE — Hospital Course (Signed)
[name] is a 62 y.o. female with medical history significant for HFpEF (EF 60-65%), CKD stage IV (baseline creatinine 1.7-2.5), HTN, HLD, anemia of chronic disease, adrenal adenoma who is admitted for acute on chronic HFpEF. She complained of worsening dyspnea on admission with lower extremity swelling.  BNP was mildly elevated, troponin trend was flat. She was treated with IV Lasix with good diuretic response and breathing status improved. CXR showed no significant signs of volume overload however.  She was offered further workup for her dyspnea with viral respiratory swab testing but she declined.  She has also had right ankle surgery on 8/28 and revision on 9/17.  She has been ambulating with her walking boot.  She was offered further VTE workup but also declined.  Due to her symptomatic improvement after Lasix, she was amenable with discharging home after declining further workup.  She was recommended to return if her dyspnea again worsened as she may need further workup to rule out potential DVT or PE.

## 2023-07-25 NOTE — ED Notes (Signed)
ED TO INPATIENT HANDOFF REPORT  ED Nurse Name and Phone #: Robbi Garter Name/Age/Gender [name] 62 y.o. female Room/Bed: WA24/WA24  Code Status   Code Status: Prior  Home/SNF/Other Home Patient oriented to: self, place, time, and situation Is this baseline? Yes   Triage Complete: Triage complete  Chief Complaint Acute on chronic heart failure with preserved ejection fraction (HFpEF) (HCC) [I50.33]  Triage Note 4 days of feeling like the left side "is being pulled apart." Pain is increasing. Heart feels like it is beating too fast and there might be fluid on the chest.   Hx of congestive heart failure. Broke ankle in August   Allergies Allergies  Allergen Reactions   Lisinopril Cough    Level of Care/Admitting Diagnosis ED Disposition     ED Disposition  Admit   Condition  --   Comment  Hospital Area: [facility] [[id number]]  Level of Care: Telemetry [5]  Admit to tele based on following criteria: Acute CHF  May admit patient to [facility] or [name] if equivalent level of care is available:: No  Covid Evaluation: Asymptomatic - no recent exposure (last 10 days) testing not required  Diagnosis: Acute on chronic heart failure with preserved ejection fraction (HFpEF) [facility]) [[id number]]  Admitting Physician: Charlsie Quest [[id number]]  Attending Physician: Charlsie Quest [[id number]]  Certification:: I certify this patient will need inpatient services for at least 2 midnights  Expected Medical Readiness: 07/27/2023          B Medical/Surgery History Past Medical History:  Diagnosis Date   Adrenal adenoma 01/11/2021   Anemia    CHF (congestive heart failure) (HCC)    Chronic bronchitis (HCC)    "probably once/yr" (05/12/2013)   Chronic kidney disease    Diastolic heart failure    Dyslipidemia    pt denies this hx on 05/12/2013   Exertional shortness of breath    GERD (gastroesophageal reflux disease)    Heart murmur    Hypertension     Hypokalemia    Left ventricular hypertrophy 01/11/2021   Venous stasis ulcers (HCC)    Past Surgical History:  Procedure Laterality Date   BIOPSY  02/23/2023   Procedure: BIOPSY;  Surgeon: Lemar Lofty., MD;  Location: [facility] ENDOSCOPY;  Service: Gastroenterology;;   COLONOSCOPY N/A 02/23/2023   Procedure: COLONOSCOPY;  Surgeon: Lemar Lofty., MD;  Location: [facility] ENDOSCOPY;  Service: Gastroenterology;  Laterality: N/A;   ESOPHAGOGASTRODUODENOSCOPY N/A 02/23/2023   Procedure: ESOPHAGOGASTRODUODENOSCOPY (EGD);  Surgeon: Lemar Lofty., MD;  Location: [facility] ENDOSCOPY;  Service: Gastroenterology;  Laterality: N/A;   HARDWARE REMOVAL Right 05/08/2023   Procedure: HARDWARE REMOVAL MEDIAL AND LATERAL;  Surgeon: Terance Hart, MD;  Location: [facility] ORS;  Service: Orthopedics;  Laterality: Right;   HOT HEMOSTASIS N/A 02/23/2023   Procedure: HOT HEMOSTASIS (ARGON PLASMA COAGULATION/BICAP);  Surgeon: Lemar Lofty., MD;  Location: [facility] ENDOSCOPY;  Service: Gastroenterology;  Laterality: N/A;   IR US GUIDE VASC ACCESS RIGHT  02/27/2017   IR VENOGRAM ADRENAL BI  02/27/2017   IR VENOGRAM HEPATIC WO HEMODYNAMIC EVALUATION  02/27/2017   IR VENOGRAM RENAL BI  02/27/2017   IR VENOUS SAMPLING  02/27/2017   IR VENOUS SAMPLING  02/27/2017   ORIF ANKLE FRACTURE Right 04/18/2023   Procedure: OPEN REDUCTION INTERNAL FIXATION (ORIF) ANKLE FRACTURE;  Surgeon: Joen Laura, MD;  Location: [facility] ORS;  Service: Orthopedics;  Laterality: Right;   ORIF ANKLE FRACTURE Right 05/08/2023   Procedure: REVISION OPEN  REDUCTION INTERNAL FIXATION (ORIF) OF RIGHT TRIMALLEOLAR ANKLE FRACTURE, OPEN TREATMENT OF SYNDESMOSIS;  Surgeon: Terance Hart, MD;  Location: [facility]S;  Service: Orthopedics;  Laterality: Right;   POLYPECTOMY  02/23/2023   Procedure: POLYPECTOMY;  Surgeon: Meridee Score Netty Starring., MD;  Location: [facility] ENDOSCOPY;  Service: Gastroenterology;;   SYNDESMOSIS REPAIR Right 05/08/2023   Procedure:  SYNDESMOSIS REPAIR;  Surgeon: Terance Hart, MD;  Location: [facility] ORS;  Service: Orthopedics;  Laterality: Right;   VAGINAL HYSTERECTOMY  1990's     A IV Location/Drains/Wounds Patient Lines/Drains/Airways Status     Active Line/Drains/Airways     Name Placement date Placement time Site Days   Peripheral IV 07/25/23 20 G Left Antecubital 07/25/23  1837  Antecubital  less than 1            Intake/Output Last 24 hours  Intake/Output Summary (Last 24 hours) at 07/25/2023 2310 Last data filed at 07/25/2023 2215 Gross per 24 hour  Intake --  Output 250 ml  Net -250 ml    Labs/Imaging Results for orders placed or performed during the hospital encounter of 07/25/23 (from the past 48 hour(s))  Basic metabolic panel     Status: Abnormal   Collection Time: 07/25/23  3:27 PM  Result Value Ref Range   Sodium 141 135 - 145 mmol/L   Potassium 4.7 3.5 - 5.1 mmol/L   Chloride 110 98 - 111 mmol/L   CO2 23 22 - 32 mmol/L   Glucose, Bld 88 70 - 99 mg/dL    Comment: Glucose reference range applies only to samples taken after fasting for at least 8 hours.   BUN 51 (H) 8 - 23 mg/dL   Creatinine, Ser 1.61 (H) 0.44 - 1.00 mg/dL   Calcium 9.0 8.9 - 09.6 mg/dL   GFR, Estimated 20 (L) >60 mL/min    Comment: (NOTE) Calculated using the CKD-EPI Creatinine Equation (2021)    Anion gap 8 5 - 15    Comment: Performed at [facility], [address], [place], [place] [address]  CBC     Status: Abnormal   Collection Time: 07/25/23  3:27 PM  Result Value Ref Range   WBC 6.3 4.0 - 10.5 K/uL   RBC 3.71 (L) 3.87 - 5.11 MIL/uL   Hemoglobin 10.4 (L) 12.0 - 15.0 g/dL   HCT 98.1 (L) 19.1 - 47.8 %   MCV 91.9 80.0 - 100.0 fL   MCH 28.0 26.0 - 34.0 pg   MCHC 30.5 30.0 - 36.0 g/dL   RDW 29.5 62.1 - 30.8 %   Platelets 265 150 - 400 K/uL   nRBC 0.0 0.0 - 0.2 %    Comment: Performed at [facility], [address], [place], [place] [address]  Troponin I (High  Sensitivity)     Status: Abnormal   Collection Time: 07/25/23  3:27 PM  Result Value Ref Range   Troponin I (High Sensitivity) 21 (H) <18 ng/L    Comment: (NOTE) Elevated high sensitivity troponin I (hsTnI) values and significant  changes across serial measurements may suggest ACS but many other  chronic and acute conditions are known to elevate hsTnI results.  Refer to the "Links" section for chest pain algorithms and additional  guidance. Performed at [facility], [address], [place], [place] [address]   Brain natriuretic peptide     Status: Abnormal   Collection Time: 07/25/23  3:27 PM  Result Value Ref Range   B Natriuretic Peptide  240.9 (H) 0.0 - 100.0 pg/mL    Comment: Performed at [facility], [address], [place], [place] [address]  Hepatic function panel     Status: None   Collection Time: 07/25/23  3:27 PM  Result Value Ref Range   Total Protein 7.8 6.5 - 8.1 g/dL   Albumin 3.9 3.5 - 5.0 g/dL   AST 17 15 - 41 U/L   ALT 21 0 - 44 U/L   Alkaline Phosphatase 71 38 - 126 U/L   Total Bilirubin 0.6 <1.2 mg/dL   Bilirubin, Direct <1.4 0.0 - 0.2 mg/dL   Indirect Bilirubin NOT CALCULATED 0.3 - 0.9 mg/dL    Comment: Performed at [facility], [address], [place], [place] [address]  Troponin I (High Sensitivity)     Status: Abnormal   Collection Time: 07/25/23  7:22 PM  Result Value Ref Range   Troponin I (High Sensitivity) 22 (H) <18 ng/L    Comment: (NOTE) Elevated high sensitivity troponin I (hsTnI) values and significant  changes across serial measurements may suggest ACS but many other  chronic and acute conditions are known to elevate hsTnI results.  Refer to the "Links" section for chest pain algorithms and additional  guidance. Performed at [facility], [address] [place], [place] [address]    DG Chest 2 View  Result Date: 07/25/2023 CLINICAL DATA:  Chest pain and  shortness of breath for 3 days. EXAM: CHEST - 2 VIEW COMPARISON:  Chest radiographs 05/15/2023 and 12/04/2020 FINDINGS: Cardiac silhouette is again mildly enlarged. Mediastinal contours are within normal limits. The lungs are clear. No pleural effusion pneumothorax. Minimal dextrocurvature of the mid to lower thoracic spine with mild-to-moderate multilevel degenerative disc changes. IMPRESSION: 1. No active cardiopulmonary disease. 2. Mild cardiomegaly. Electronically Signed   By: Neita Garnet M.D.   On: 07/25/2023 16:29    Pending Labs Unresulted Labs (From admission, onward)    None       Vitals/Pain Today's Vitals   07/25/23 1956 07/25/23 2030 07/25/23 2100 07/25/23 2250  BP:  (!) 139/58 137/64   Pulse:  65 66   Resp:  16 16   Temp:    98.4 F (36.9 C)  TempSrc:    Oral  SpO2:  98% 99%   Weight:      Height:      PainSc: 0-No pain       Isolation Precautions No active isolations  Medications Medications  cyclobenzaprine (FLEXERIL) tablet 10 mg (10 mg Oral Given 07/25/23 1631)  acetaminophen (TYLENOL) tablet 1,000 mg (1,000 mg Oral Given 07/25/23 1631)  furosemide (LASIX) injection 40 mg (40 mg Intravenous Given 07/25/23 1909)  HYDROmorphone (DILAUDID) injection 0.5 mg (0.5 mg Intravenous Given 07/25/23 1909)  ondansetron (ZOFRAN) injection 4 mg (4 mg Intravenous Given 07/25/23 1908)  carvedilol (COREG) tablet 12.5 mg (12.5 mg Oral Given 07/25/23 1908)  hydrALAZINE (APRESOLINE) tablet 50 mg (50 mg Oral Given 07/25/23 1908)    Mobility walks with person assist     Focused Assessments     R Recommendations: See Admitting Provider Note  Report given to:   Additional Notes:

## 2023-07-25 NOTE — ED Provider Notes (Signed)
[place] EMERGENCY DEPARTMENT AT [facility] Provider Note   CSN: [id number] Arrival date & time: 07/25/23  1456     History  Chief Complaint  Patient presents with   Chest Pain   Pain    [name] is a 62 y.o. female.  Patient is a 62 year old female with a history of diastolic heart failure, anemia, hypertension, chronic venous stasis, GERD, CKD who is presenting today with several complaints.  She reports for the last week she has had severe pain in the whole left side of her body including her left arm, chest, abdomen and leg.  She thinks it is related to having to overcompensate on that side because she has been in a boot since August on her right leg.  However she came in today because for the last 4 days to weeks she has had orthopnea and dyspnea on exertion.  The pain is a little bit worse at times when she takes a deep breath.  It seems to be more constant.  She does not check her weight regularly and has noticed swelling in her legs but is not sure if it is any worse.  She has been compliant with her medications and saw her cardiologist 2 weeks ago and they increased her cholesterol medication and her blood pressure medicine.  She reports her blood pressures have been better at home between the 140s over 70s.  She has had a mild cough but has not had known fever.  No sputum production.  She has not noticed any significant numbness or weakness on that side.  She only takes Tylenol for the pain.  The history is provided by the patient.  Chest Pain      Home Medications Prior to Admission medications   Medication Sig Start Date End Date Taking? Authorizing Provider  acetaminophen (TYLENOL) 325 MG tablet Take 650 mg by mouth every 6 (six) hours as needed for mild pain or headache.    [provider]  amLODipine (NORVASC) 10 MG tablet Take 1 tablet (10 mg total) by mouth daily. 03/07/23   Ellender Hose, NP  aspirin EC 81 MG tablet Take 1 tablet (81 mg  total) by mouth 2 (two) times daily. For blood clot prevention. Take for 30 days after surgery. Swallow whole. 05/08/23 05/07/24  [place], Jesse J, PA-C  atorvastatin (LIPITOR) 80 MG tablet Take 1 tablet (80 mg total) by mouth daily. 06/06/23   Chilton Si, MD  carvedilol (COREG) 12.5 MG tablet Take 1 tablet (12.5 mg total) by mouth 2 (two) times daily with a meal. 06/06/23   Chilton Si, MD  FARXIGA 10 MG TABS tablet Take 1 tablet (10 mg total) by mouth daily. 03/07/23   Ellender Hose, NP  ferrous gluconate (FERGON) 324 MG tablet Take 1 tablet (324 mg total) by mouth daily with breakfast. 02/23/23   Rai, Ripudeep K, MD  furosemide (LASIX) 40 MG tablet Take 1 tablet (40 mg total) by mouth daily. 05/21/23   Rodolph Bong, MD  hydrALAZINE (APRESOLINE) 50 MG tablet Take 1 tablet (50 mg total) by mouth 3 (three) times daily. 02/23/23   Rai, Delene Ruffini, MD  isosorbide mononitrate (IMDUR) 60 MG 24 hr tablet Take 1 tablet (60 mg total) by mouth daily. 02/24/23   Rai, Delene Ruffini, MD  loperamide (IMODIUM) 2 MG capsule Take 1 capsule (2 mg total) by mouth as needed for diarrhea or loose stools. 05/20/23   Rodolph Bong, MD  melatonin 3 MG TABS tablet  Take 1 tablet (3 mg total) by mouth at bedtime as needed. 05/29/23   [name], FNP  methocarbamol (ROBAXIN) 500 MG tablet Take 500 mg by mouth every 6 (six) hours as needed. 05/02/23   [provider]  oxyCODONE (ROXICODONE) 5 MG immediate release tablet Take 1 tablet (5 mg total) by mouth every 4 (four) hours as needed for severe pain. Patient not taking: Reported on 07/04/2023 05/20/23   Rodolph Bong, MD  polyethylene glycol (MIRALAX / GLYCOLAX) 17 g packet Take 17 g by mouth daily as needed for mild constipation or moderate constipation.    [provider]  spironolactone (ALDACTONE) 25 MG tablet TAKE 1 TABLET BY MOUTH DAILY 06/01/23   [name], FNP      Allergies    Lisinopril    Review of Systems   Review of  Systems  Cardiovascular:  Positive for chest pain.    Physical Exam Updated Vital Signs BP 137/64   Pulse 66   Temp 98.6 F (37 C) (Oral)   Resp 16   Ht 5\' 8"  (1.727 m)   Wt 97.5 kg   SpO2 99%   BMI 32.69 kg/m  Physical Exam Vitals and nursing note reviewed.  Constitutional:      General: She is not in acute distress.    Appearance: She is well-developed.  HENT:     Head: Normocephalic and atraumatic.  Eyes:     Pupils: Pupils are equal, round, and reactive to light.  Cardiovascular:     Rate and Rhythm: Normal rate and regular rhythm.     Heart sounds: Murmur heard.     Systolic murmur is present with a grade of 2/6.     No friction rub.  Pulmonary:     Effort: Pulmonary effort is normal.     Breath sounds: Normal breath sounds. No wheezing or rales.  Abdominal:     General: Bowel sounds are normal. There is no distension.     Palpations: Abdomen is soft.     Tenderness: There is no abdominal tenderness. There is no guarding or rebound.  Musculoskeletal:        General: Normal range of motion.     Cervical back: Normal, normal range of motion and neck supple. No tenderness.     Lumbar back: Tenderness and bony tenderness present.       Back:     Right lower leg: Edema present.     Left lower leg: Edema present.     Comments: 2+ pitting edema in the bilateral lower extremities from the shin down.    Skin:    General: Skin is warm and dry.     Findings: No rash.  Neurological:     Mental Status: She is alert and oriented to person, place, and time. Mental status is at baseline.     Cranial Nerves: No cranial nerve deficit.     Comments: No pronator drift noted in bilateral upper extremities.  No drift in the left lower extremity.  Boot on the right lower extremity and patient reports difficulty lifting it because the boot is heavy.  Sensation intact bilaterally in upper and lower extremities.  No notable facial droop.  Speech is normal.  Psychiatric:         Behavior: Behavior normal.     ED Results / Procedures / Treatments   Labs (all labs ordered are listed, but only abnormal results are displayed) Labs Reviewed  BASIC METABOLIC PANEL - Abnormal; Notable  for the following components:      Result Value   BUN 51 (*)    Creatinine, Ser 2.58 (*)    GFR, Estimated 20 (*)    All other components within normal limits  CBC - Abnormal; Notable for the following components:   RBC 3.71 (*)    Hemoglobin 10.4 (*)    HCT 34.1 (*)    All other components within normal limits  BRAIN NATRIURETIC PEPTIDE - Abnormal; Notable for the following components:   B Natriuretic Peptide 240.9 (*)    All other components within normal limits  TROPONIN I (HIGH SENSITIVITY) - Abnormal; Notable for the following components:   Troponin I (High Sensitivity) 21 (*)    All other components within normal limits  TROPONIN I (HIGH SENSITIVITY) - Abnormal; Notable for the following components:   Troponin I (High Sensitivity) 22 (*)    All other components within normal limits  HEPATIC FUNCTION PANEL    EKG EKG Interpretation Date/Time:  Wednesday July 25 2023 15:33:48 EST Ventricular Rate:  72 PR Interval:  228 QRS Duration:  92 QT Interval:  422 QTC Calculation: 462 R Axis:   -41  Text Interpretation: Sinus rhythm Prolonged PR interval Probable left atrial enlargement Abnormal R-wave progression, late transition Left ventricular hypertrophy Abnormal T, consider ischemia, lateral leads ST elevation, consider inferior injury No significant change since last tracing Confirmed by Gwyneth Sprout ([id number]) on 07/25/2023 4:27:49 PM  Radiology DG Chest 2 View  Result Date: 07/25/2023 CLINICAL DATA:  Chest pain and shortness of breath for 3 days. EXAM: CHEST - 2 VIEW COMPARISON:  Chest radiographs 05/15/2023 and 12/04/2020 FINDINGS: Cardiac silhouette is again mildly enlarged. Mediastinal contours are within normal limits. The lungs are clear. No pleural effusion  pneumothorax. Minimal dextrocurvature of the mid to lower thoracic spine with mild-to-moderate multilevel degenerative disc changes. IMPRESSION: 1. No active cardiopulmonary disease. 2. Mild cardiomegaly. Electronically Signed   By: Neita Garnet M.D.   On: 07/25/2023 16:29    Procedures Procedures    Medications Ordered in ED Medications  cyclobenzaprine (FLEXERIL) tablet 10 mg (10 mg Oral Given 07/25/23 1631)  acetaminophen (TYLENOL) tablet 1,000 mg (1,000 mg Oral Given 07/25/23 1631)  furosemide (LASIX) injection 40 mg (40 mg Intravenous Given 07/25/23 1909)  HYDROmorphone (DILAUDID) injection 0.5 mg (0.5 mg Intravenous Given 07/25/23 1909)  ondansetron (ZOFRAN) injection 4 mg (4 mg Intravenous Given 07/25/23 1908)  carvedilol (COREG) tablet 12.5 mg (12.5 mg Oral Given 07/25/23 1908)  hydrALAZINE (APRESOLINE) tablet 50 mg (50 mg Oral Given 07/25/23 1908)    ED Course/ Medical Decision Making/ A&P                                 Medical Decision Making Amount and/or Complexity of Data Reviewed External Data Reviewed: notes.    Details: Prior hospitalization Labs: ordered. Decision-making details documented in ED Course. Radiology: ordered and independent interpretation performed. Decision-making details documented in ED Course. ECG/medicine tests: ordered and independent interpretation performed. Decision-making details documented in ED Course.  Risk OTC drugs. Prescription drug management.   Pt with multiple medical problems and comorbidities and presenting today with a complaint that caries a high risk for morbidity and mortality.  Here today with 1 week of left sided pain.  She reports that involves her arm, leg, chest and abdomen.  Patient is not displaying signs of stroke at this time as she has no pronator drift  or weakness noted.  Sensation is normal.  Also patient is complaining of orthopnea and DOE.  She does have a history of diastolic dysfunction and CHF.  She has been  compliant with her medications including her diuretic.  She does report that she is urinating regularly.  She does report mild cough but has not had fever or sputum production.  Sats are 99% on room air.  Patient's heart rate is normal.  She does have a noted murmur however this has been documented multiple times in her prior doctors visits.  Concern for worsening renal failure, anemia, electrolyte abnormality, CHF.  Lower suspicion for PE, stroke, pneumonia.  I independently interpreted patient's EKG does have nonspecific ST changes however unchanged from prior EKGs.  10:39 PM I independently interpreted patient's labs and BMP today with normal electrolytes and creatinine of 2.58 with normal anion gap.  Patient's normal creatinine is between 1.7-2.5, CBC with normal white count and hemoglobin of 10.4 which is also within patient's normal range.  Patient's troponin today is 21 which is similar to prior and BNP is 240 which is what it was when she was last hospitalized for CHF 2 months ago.  LFTs are normal.  I have independently visualized and interpreted pt's images today. Chest x-ray without acute findings and no significant signs of fluid overload.  Given patient's symptoms and labs similar to her last hospitalization will try giving her 1 dose of IV Lasix here to see if she can start diuresing and have her increase her Lasix for home.  If her symptoms improve she can follow-up as an outpatient with Dr. Duke Salvia or she can return to the emergency room if symptoms are worsening.  She is comfortable with this plan so she was given IV Lasix here and will reassess.  10:39 PM After IV lasix pt has only put out 250 and still c/o of SOB and orthopnea.  BP improved with her home meds.  Pt was not able to ambulate due to feeling dizzy and SOB.  Due to minimal diuresis, persistent sx and unable to ambulate here will admit for further diuresis and symptoms control.           Final Clinical Impression(s) / ED  Diagnoses Final diagnoses:  Acute on chronic congestive heart failure, unspecified heart failure type (HCC)  Atypical chest pain    Rx / DC Orders ED Discharge Orders     None         Gwyneth Sprout, MD 07/25/23 2239

## 2023-07-25 NOTE — H&P (Signed)
History and Physical    [name] ZOX:[id number] DOB: 1960/12/07 DOA: 07/25/2023  PCP: [name], FNP  Patient coming from: Home  I have personally briefly reviewed patient's old medical records in [facility]  Chief Complaint: Shortness of breath  HPI: [name] is a 62 y.o. female with medical history significant for HFpEF (EF 60-65%), CKD stage IV (baseline creatinine 1.7-2.5), HTN, HLD, anemia of chronic disease, adrenal adenoma who presented to the ED for evaluation of shortness of breath.  Patient reports 1 week of orthopnea, dyspnea on exertion, nonproductive cough, swelling bilateral lower extremities.  She also reports pain involving her whole left side.  She is in a walking boot for her right leg and thinks that her left-sided pain is due to overcompensation on that side.  She says her weight at home was 215 pounds, she does think that she has gained weight but not sure how much.  She has felt congested in her chest.  She says she has been taking her Lasix 40 mg every morning and is urinating frequently at nighttime.  ED Course  Labs/Imaging on admission: I have personally reviewed following labs and imaging studies.  Initial vitals showed BP 147/81, pulse 74, RR 16, temp 98.1 F, SpO2 98% on room air.  Labs showed WBC 6.3, hemoglobin 10.4, platelets 265,000, sodium 141, potassium 4.7, bicarb 23, BUN 51, creatinine 2.58, serum glucose 88, troponin 21 > 22, BNP 240.9.  2 view chest x-ray showed mild cardiomegaly.  Patient was given IV Lasix 40 mg, oral Coreg 12.5 mg, oral hydralazine 50 mg, IV Dilaudid 0.5 mg.  Recorded UOP 250 mLs after receiving IV Lasix.  Patient ambulated in the ED and became dyspneic and dizzy.  The hospitalist service was consulted to admit for further evaluation and management.  Review of Systems: All systems reviewed and are negative except as documented in history of present illness above.   Past Medical History:  Diagnosis Date    Adrenal adenoma 01/11/2021   Anemia    CHF (congestive heart failure) (HCC)    Chronic bronchitis (HCC)    "probably once/yr" (05/12/2013)   Chronic kidney disease    Diastolic heart failure    Dyslipidemia    pt denies this hx on 05/12/2013   Exertional shortness of breath    GERD (gastroesophageal reflux disease)    Heart murmur    Hypertension    Hypokalemia    Left ventricular hypertrophy 01/11/2021   Venous stasis ulcers (HCC)     Past Surgical History:  Procedure Laterality Date   BIOPSY  02/23/2023   Procedure: BIOPSY;  Surgeon: Lemar Lofty., MD;  Location[facility] ENDOSCOPY;  Service: Gastroenterology;;   COLONOSCOPY N/A 02/23/2023   Procedure: COLONOSCOPY;  Surgeon: Lemar Lofty., MD;  Location: [facility] ENDOSCOPY;  Service: Gastroenterology;  Laterality: N/A;   ESOPHAGOGASTRODUODENOSCOPY N/A 02/23/2023   Procedure: ESOPHAGOGASTRODUODENOSCOPY (EGD);  Surgeon: Lemar Lofty., MD;  Location: [facility] ENDOSCOPY;  Service: Gastroenterology;  Laterality: N/A;   HARDWARE REMOVAL Right 05/08/2023   Procedure: HARDWARE REMOVAL MEDIAL AND LATERAL;  Surgeon: Terance Hart, MD;  Location: [facility] ORS;  Service: Orthopedics;  Laterality: Right;   HOT HEMOSTASIS N/A 02/23/2023   Procedure: HOT HEMOSTASIS (ARGON PLASMA COAGULATION/BICAP);  Surgeon: Lemar Lofty., MD;  Location: [facility] ENDOSCOPY;  Service: Gastroenterology;  Laterality: N/A;   IR US GUIDE VASC ACCESS RIGHT  02/27/2017   IR VENOGRAM ADRENAL BI  02/27/2017   IR VENOGRAM HEPATIC WO HEMODYNAMIC EVALUATION  02/27/2017  IR VENOGRAM RENAL BI  02/27/2017   IR VENOUS SAMPLING  02/27/2017   IR VENOUS SAMPLING  02/27/2017   ORIF ANKLE FRACTURE Right 04/18/2023   Procedure: OPEN REDUCTION INTERNAL FIXATION (ORIF) ANKLE FRACTURE;  Surgeon: Joen Laura, MD;  Location: [facility] ORS;  Service: Orthopedics;  Laterality: Right;   ORIF ANKLE FRACTURE Right 05/08/2023   Procedure: REVISION OPEN REDUCTION INTERNAL FIXATION  (ORIF) OF RIGHT TRIMALLEOLAR ANKLE FRACTURE, OPEN TREATMENT OF SYNDESMOSIS;  Surgeon: Terance Hart, MD;  Location: [facility] ORS;  Service: Orthopedics;  Laterality: Right;   POLYPECTOMY  02/23/2023   Procedure: POLYPECTOMY;  Surgeon: Meridee Score Netty Starring., MD;  Location: [facility] ENDOSCOPY;  Service: Gastroenterology;;   SYNDESMOSIS REPAIR Right 05/08/2023   Procedure: SYNDESMOSIS REPAIR;  Surgeon: Terance Hart, MD;  Location: [facility] ORS;  Service: Orthopedics;  Laterality: Right;   VAGINAL HYSTERECTOMY  1990's    Social History:  reports that she has been smoking cigarettes. She has a 5 pack-year smoking history. She has never used smokeless tobacco. She reports that she does not currently use alcohol. She reports that she does not currently use drugs after having used the following drugs: Cocaine.  Allergies  Allergen Reactions   Lisinopril Cough    Family History  Problem Relation Age of Onset   Hypertension Mother    Kidney failure Mother    Heart attack Father    Hypertension Sister    Diabetes Sister    Hypertension Brother    Diabetes Sister      Prior to Admission medications   Medication Sig Start Date End Date Taking? Authorizing Provider  acetaminophen (TYLENOL) 325 MG tablet Take 650 mg by mouth every 6 (six) hours as needed for mild pain or headache.    [provider]  amLODipine (NORVASC) 10 MG tablet Take 1 tablet (10 mg total) by mouth daily. 03/07/23   Ellender Hose, NP  aspirin EC 81 MG tablet Take 1 tablet (81 mg total) by mouth 2 (two) times daily. For blood clot prevention. Take for 30 days after surgery. Swallow whole. 05/08/23 05/07/24  [place], Jesse J, PA-C  atorvastatin (LIPITOR) 80 MG tablet Take 1 tablet (80 mg total) by mouth daily. 06/06/23   Chilton Si, MD  carvedilol (COREG) 12.5 MG tablet Take 1 tablet (12.5 mg total) by mouth 2 (two) times daily with a meal. 06/06/23   Chilton Si, MD  FARXIGA 10 MG TABS tablet Take 1 tablet (10 mg  total) by mouth daily. 03/07/23   Ellender Hose, NP  ferrous gluconate (FERGON) 324 MG tablet Take 1 tablet (324 mg total) by mouth daily with breakfast. 02/23/23   Rai, Ripudeep K, MD  furosemide (LASIX) 40 MG tablet Take 1 tablet (40 mg total) by mouth daily. 05/21/23   Rodolph Bong, MD  hydrALAZINE (APRESOLINE) 50 MG tablet Take 1 tablet (50 mg total) by mouth 3 (three) times daily. 02/23/23   Rai, Delene Ruffini, MD  isosorbide mononitrate (IMDUR) 60 MG 24 hr tablet Take 1 tablet (60 mg total) by mouth daily. 02/24/23   Rai, Delene Ruffini, MD  loperamide (IMODIUM) 2 MG capsule Take 1 capsule (2 mg total) by mouth as needed for diarrhea or loose stools. 05/20/23   Rodolph Bong, MD  melatonin 3 MG TABS tablet Take 1 tablet (3 mg total) by mouth at bedtime as needed. 05/29/23   [name], FNP  methocarbamol (ROBAXIN) 500 MG tablet Take 500 mg by mouth every 6 (six) hours as needed. 05/02/23  [provider]  oxyCODONE (ROXICODONE) 5 MG immediate release tablet Take 1 tablet (5 mg total) by mouth every 4 (four) hours as needed for severe pain. Patient not taking: Reported on 07/04/2023 05/20/23   Rodolph Bong, MD  polyethylene glycol (MIRALAX / GLYCOLAX) 17 g packet Take 17 g by mouth daily as needed for mild constipation or moderate constipation.    [provider]  spironolactone (ALDACTONE) 25 MG tablet TAKE 1 TABLET BY MOUTH DAILY 06/01/23   [name], FNP    Physical Exam: Vitals:   07/25/23 1903 07/25/23 2030 07/25/23 2100 07/25/23 2250  BP:  (!) 139/58 137/64   Pulse:  65 66   Resp:  16 16   Temp: 98.6 F (37 C)   98.4 F (36.9 C)  TempSrc: Oral   Oral  SpO2:  98% 99%   Weight:      Height:       Constitutional: Resting in bed, NAD, calm, comfortable Eyes: EOMI, lids and conjunctivae normal ENMT: Mucous membranes are moist. Posterior pharynx clear of any exudate or lesions.Normal dentition.  Neck: normal, supple, no masses. Respiratory: clear to  auscultation bilaterally, no wheezing, no crackles. Normal respiratory effort. No accessory muscle use.  Cardiovascular: Regular rate and rhythm, systolic murmur.  +1 bilateral lower extremity edema. Abdomen: no tenderness, no masses palpated. Musculoskeletal: Boot on right ankle/foot, ROM otherwise intact Skin: no rashes, lesions, ulcers. No induration Neurologic: Sensation intact. Strength largely 5 out of 5 in all extremities, limited right ankle due to fracture and boot in place. Psychiatric: Alert and oriented x 3. Normal mood.   EKG: Personally reviewed. Sinus rhythm, rate 72, first-degree AV block, late R wave transition, repolarization changes inferior leads.  Not significantly changed when compared to previous.  Assessment/Plan Principal Problem:   Acute on chronic heart failure with preserved ejection fraction (HFpEF) (HCC) Active Problems:   Chronic kidney disease (CKD), stage IV (severe) (HCC)   Essential hypertension   Hyperlipidemia   Anemia of chronic disease   [name] is a 62 y.o. female with medical history significant for HFpEF (EF 60-65%), CKD stage IV (baseline creatinine 1.7-2.5), HTN, HLD, anemia of chronic disease, adrenal adenoma who is admitted for acute on chronic HFpEF.  Assessment and Plan: Acute on chronic HFpEF: Presenting with DOE, orthopnea, peripheral edema, BNP 240.9.  Currently saturating well on room air.  Could not ambulate safely in ED due to dyspnea.  TTE 05/16/2023 showed EF 60-65%, severely dilated LA. -IV Lasix 40 mg twice daily -Continue Coreg 12.5 mg twice daily -Holding spironolactone and Farxiga for now due to renal dysfunction -Strict I/O's and daily weights  CKD stage IV: Creatinine 2.58 on admission, increased from recent baseline. -Holding spironolactone and Farxiga for now -Monitor response to diuresis  Hypertension: Continue amlodipine, Coreg, hydralazine.  Spironolactone on hold as above.  Anemia of chronic  disease: Hemoglobin stable at 10.4.  Hyperlipidemia: Continue atorvastatin.  Status post ORIF right ankle (04/18/2023) with revision (05/08/2023): Boot in place.  Follow-up with orthopedics.   DVT prophylaxis: heparin injection 5,000 Units Start: 07/26/23 0600 Code Status: Full code, confirmed with patient on admission Family Communication: Discussed with patient, she has discussed with family Disposition Plan: From home and likely discharge to home pending clinical progress Consults called: None Severity of Illness: The appropriate patient status for this patient is INPATIENT. Inpatient status is judged to be reasonable and necessary in order to provide the required intensity of service to ensure the patient's safety. The  patient's presenting symptoms, physical exam findings, and initial radiographic and laboratory data in the context of their chronic comorbidities is felt to place them at high risk for further clinical deterioration. Furthermore, it is not anticipated that the patient will be medically stable for discharge from the hospital within 2 midnights of admission.   * I certify that at the point of admission it is my clinical judgment that the patient will require inpatient hospital care spanning beyond 2 midnights from the point of admission due to high intensity of service, high risk for further deterioration and high frequency of surveillance required.Darreld Mclean MD [facility]  If 7PM-7AM, please contact night-coverage [email or web address]  07/25/2023, 11:32 PM

## 2023-07-26 ENCOUNTER — Encounter: Payer: Self-pay | Admitting: Licensed Clinical Social Worker

## 2023-07-26 DIAGNOSIS — I5033 Acute on chronic diastolic (congestive) heart failure: Secondary | ICD-10-CM | POA: Diagnosis present

## 2023-07-26 LAB — BASIC METABOLIC PANEL
Anion gap: 10 (ref 5–15)
BUN: 54 mg/dL — ABNORMAL HIGH (ref 8–23)
CO2: 23 mmol/L (ref 22–32)
Calcium: 9.1 mg/dL (ref 8.9–10.3)
Chloride: 106 mmol/L (ref 98–111)
Creatinine, Ser: 2.5 mg/dL — ABNORMAL HIGH (ref 0.44–1.00)
GFR, Estimated: 21 mL/min — ABNORMAL LOW (ref >60)
Glucose, Bld: 67 mg/dL — ABNORMAL LOW (ref 70–99)
Potassium: 4.2 mmol/L (ref 3.5–5.1)
Sodium: 139 mmol/L (ref 135–145)

## 2023-07-26 LAB — CBC
HCT: 33.6 % — ABNORMAL LOW (ref 36.0–46.0)
Hemoglobin: 10 g/dL — ABNORMAL LOW (ref 12.0–15.0)
MCH: 27.4 pg (ref 26.0–34.0)
MCHC: 29.8 g/dL — ABNORMAL LOW (ref 30.0–36.0)
MCV: 92.1 fL (ref 80.0–100.0)
Platelets: 264 10*3/uL (ref 150–400)
RBC: 3.65 MIL/uL — ABNORMAL LOW (ref 3.87–5.11)
RDW: 14 % (ref 11.5–15.5)
WBC: 5.3 10*3/uL (ref 4.0–10.5)
nRBC: 0 % (ref 0.0–0.2)

## 2023-07-26 LAB — MAGNESIUM: Magnesium: 2.2 mg/dL (ref 1.7–2.4)

## 2023-07-26 LAB — D-DIMER, QUANTITATIVE: D-Dimer, Quant: 0.83 ug{FEU}/mL — ABNORMAL HIGH (ref 0.00–0.50)

## 2023-07-26 NOTE — Progress Notes (Signed)
MD rounding made aware Patient refusing Nasal Swab ordered for Covid and Resp Panel

## 2023-07-26 NOTE — Progress Notes (Signed)
Patient to be discharged to home this afternoon. All discharge instructions including all discharge Medications and schedules for these Medications  reviewed with the Patient. Understanding verbalized and discharge AVS with the Patient at time of discharge

## 2023-07-26 NOTE — Plan of Care (Signed)
  Problem: Education: Goal: Knowledge of General Education information will improve Description: Including pain rating scale, medication(s)/side effects and non-pharmacologic comfort measures Outcome: Progressing   Problem: Clinical Measurements: Goal: Ability to maintain clinical measurements within normal limits will improve Outcome: Progressing Goal: Diagnostic test results will improve Outcome: Progressing   

## 2023-07-26 NOTE — Care Management Obs Status (Signed)
MEDICARE OBSERVATION STATUS NOTIFICATION   Patient Details  Name: [name] MRN: [id number] Date of Birth: August 06, 1961   Medicare Observation Status Notification Given:  Yes    Howell Rucks, RN 07/26/2023, 2:15 PM

## 2023-07-26 NOTE — Care Management CC44 (Signed)
Condition Code 44 Documentation Completed  Patient Details  Name: [name] MRN: [id number] Date of Birth: [birth year]   Condition Code 44 given:  Yes Patient signature on Condition Code 44 notice:  Yes Documentation of 2 MD's agreement:  Yes Code 44 added to claim:  Yes    Howell Rucks, RN 07/26/2023, 2:15 PM

## 2023-07-26 NOTE — Progress Notes (Signed)
Heart Failure Navigator Progress Note  Assessed for Heart & Vascular TOC clinic readiness.  Patient does not meet criteria due to EF 60-65%, has a scheduled appointment with Dr. Duke Salvia on 09/06/2023. .   Navigator will sign off at this time.   Rhae Hammock, BSN, Scientist, clinical (histocompatibility and immunogenetics) Only

## 2023-07-26 NOTE — TOC CM/SW Note (Signed)
Transition of Care [facility]) - Inpatient Brief Assessment   Patient Details  Name: [name] MRN: [id number] Date of Birth: [birth year]  Transition of Care [facility]) CM/SW Contact:    Howell Rucks, RN Phone Number: 07/26/2023, 9:56 AM   Clinical Narrative:  Call to pt's room to introduce role of TOC/NCM and review for dc planning. Pt reports she has an established PCP and pharmacy,  reports she currently receiving HH PT with [facility], home DME: walker, scooter, wheelchair, reports she lives with her dtr and feels safe returning home, confirmed her niece will provide transportation at discharge. Code 44 completed, pt gave verbal ok for NCM to sign form. TOC Brief Assessment completed. No further TOC needs identified.     Transition of Care Asessment: Insurance and Status: Insurance coverage has been reviewed Patient has primary care physician: Yes Home environment has been reviewed: return home with support from her dtr Prior level of function:: Independent with walker/cane /scooter as needed Prior/Current Home Services: Current home services (HH PT w/[facility]) Social Determinants of Health Reivew: SDOH reviewed no interventions necessary Readmission risk has been reviewed: Yes Transition of care needs: no transition of care needs at this time

## 2023-07-26 NOTE — Discharge Summary (Signed)
Physician Discharge Summary   [name] ZOX:[id number] DOB: [birth year] DOA: 07/25/2023  PCP: [name], FNP  Admit date: 07/25/2023 Discharge date: 07/26/2023   Admitted From: Home Disposition:  Home Discharging physician: Lewie Chamber, MD Barriers to discharge: none  Recommendations at discharge: If returns with worsening dyspnea, consider DVT/PE workup; patient declined when offered  Discharge Condition: stable CODE STATUS: Full Diet recommendation:  Diet Orders (From admission, onward)     Start     Ordered   07/26/23 0000  Diet - low sodium heart healthy        07/26/23 0851   07/25/23 2316  Diet renal with fluid restriction Fluid restriction: 1200 mL Fluid; Room service appropriate? Yes; Fluid consistency: Thin  Diet effective now       Question Answer Comment  Fluid restriction: 1200 mL Fluid   Room service appropriate? Yes   Fluid consistency: Thin      07/25/23 2316            Hospital Course: [name] is a 62 y.o. female with medical history significant for HFpEF (EF 60-65%), CKD stage IV (baseline creatinine 1.7-2.5), HTN, HLD, anemia of chronic disease, adrenal adenoma who is admitted for acute on chronic HFpEF. She complained of worsening dyspnea on admission with lower extremity swelling.  BNP was mildly elevated, troponin trend was flat. She was treated with IV Lasix with good diuretic response and breathing status improved. CXR showed no significant signs of volume overload however.  She was offered further workup for her dyspnea with viral respiratory swab testing but she declined.  She has also had right ankle surgery on 8/28 and revision on 9/17.  She has been ambulating with her walking boot.  She was offered further VTE workup but also declined.  Due to her symptomatic improvement after Lasix, she was amenable with discharging home after declining further workup.  She was recommended to return if her dyspnea again worsened as she may  need further workup to rule out potential DVT or PE.   The patient's acute and chronic medical conditions were treated accordingly. On day of discharge, patient was felt deemed stable for discharge. Patient/family member advised to call PCP or come back to ER if needed.   Principal Diagnosis: Acute on chronic heart failure with preserved ejection fraction (HFpEF) (HCC)  Discharge Diagnoses: Active Hospital Problems   Diagnosis Date Noted   Acute on chronic heart failure with preserved ejection fraction (HFpEF) (HCC) 07/25/2023    Priority: 1.   Chronic kidney disease (CKD), stage IV (severe) (HCC) 05/15/2023    Priority: 2.   Essential hypertension 05/12/2013    Priority: 3.   Acute on chronic diastolic (congestive) heart failure (HCC) 07/26/2023   Anemia of chronic disease 02/21/2023   Hyperlipidemia 05/12/2013    Resolved Hospital Problems  No resolved problems to display.     Discharge Instructions     Diet - low sodium heart healthy   Complete by: As directed    Increase activity slowly   Complete by: As directed       Allergies as of 07/26/2023       Reactions   Lisinopril Cough        Medication List     TAKE these medications    acetaminophen 325 MG tablet Commonly known as: TYLENOL Take 650 mg by mouth every 6 (six) hours as needed for mild pain or headache.   amLODipine 10 MG tablet Commonly known as: NORVASC Take 1 tablet (10  mg total) by mouth daily. Notes to patient: Last Dose of this Medication was given on July 26, 2023 at 9:59 am   aspirin EC 81 MG tablet Take 1 tablet (81 mg total) by mouth 2 (two) times daily. For blood clot prevention. Take for 30 days after surgery. Swallow whole. What changed: when to take this   atorvastatin 80 MG tablet Commonly known as: LIPITOR Take 1 tablet (80 mg total) by mouth daily. Notes to patient: Last Dose of this Medication was given on July 26, 2023 at 9:59 am   carvedilol 12.5 MG tablet Commonly  known as: COREG Take 1 tablet (12.5 mg total) by mouth 2 (two) times daily with a meal. Notes to patient: Last Dose of this Medication was given on July 26, 2023 at 8:05 am   Farxiga 10 MG Tabs tablet Generic drug: dapagliflozin propanediol Take 1 tablet (10 mg total) by mouth daily.   ferrous gluconate 324 MG tablet Commonly known as: FERGON Take 1 tablet (324 mg total) by mouth daily with breakfast.   furosemide 40 MG tablet Commonly known as: LASIX Take 1 tablet (40 mg total) by mouth daily.   hydrALAZINE 50 MG tablet Commonly known as: APRESOLINE Take 1 tablet (50 mg total) by mouth 3 (three) times daily.   isosorbide mononitrate 60 MG 24 hr tablet Commonly known as: IMDUR Take 1 tablet (60 mg total) by mouth daily. What changed:  when to take this reasons to take this   loperamide 2 MG capsule Commonly known as: IMODIUM Take 1 capsule (2 mg total) by mouth as needed for diarrhea or loose stools.   melatonin 3 MG Tabs tablet Take 1 tablet (3 mg total) by mouth at bedtime as needed.   methocarbamol 500 MG tablet Commonly known as: ROBAXIN Take 500 mg by mouth every 6 (six) hours as needed.   oxyCODONE 5 MG immediate release tablet Commonly known as: Roxicodone Take 1 tablet (5 mg total) by mouth every 4 (four) hours as needed for severe pain.   polyethylene glycol 17 g packet Commonly known as: MIRALAX / GLYCOLAX Take 17 g by mouth daily as needed for mild constipation or moderate constipation.   spironolactone 25 MG tablet Commonly known as: ALDACTONE TAKE 1 TABLET BY MOUTH DAILY        Allergies  Allergen Reactions   Lisinopril Cough    Consultations:   Procedures:   Discharge Exam: BP (!) 165/76   Pulse 78   Temp (!) 100.5 F (38.1 C) (Oral)   Resp 16   Ht 5\' 8"  (1.727 m)   Wt 97.9 kg   SpO2 98%   BMI 32.82 kg/m  Physical Exam Constitutional:      Appearance: Normal appearance.  HENT:     Head: Normocephalic and atraumatic.      Mouth/Throat:     Mouth: Mucous membranes are moist.  Eyes:     Extraocular Movements: Extraocular movements intact.  Cardiovascular:     Rate and Rhythm: Normal rate and regular rhythm.  Pulmonary:     Effort: Pulmonary effort is normal. No respiratory distress.     Breath sounds: Normal breath sounds. No wheezing.  Abdominal:     General: Bowel sounds are normal. There is no distension.     Palpations: Abdomen is soft.     Tenderness: There is no abdominal tenderness.  Musculoskeletal:        General: Normal range of motion.     Cervical back: Normal range  of motion and neck supple.     Comments: R>L LE edema with some tenderness in RLE at surgical/prior fracture site  Skin:    General: Skin is warm and dry.  Neurological:     General: No focal deficit present.     Mental Status: She is alert.  Psychiatric:        Mood and Affect: Mood normal.      The results of significant diagnostics from this hospitalization (including imaging, microbiology, ancillary and laboratory) are listed below for reference.   Microbiology: No results found for this or any previous visit (from the past 240 hour(s)).   Labs: BNP (last 3 results) Recent Labs    02/19/23 1049 05/15/23 1648 07/25/23 1527  BNP 144.5* 240.7* 240.9*   Basic Metabolic Panel: Recent Labs  Lab 07/25/23 1527 07/26/23 0537  NA 141 139  K 4.7 4.2  CL 110 106  CO2 23 23  GLUCOSE 88 67*  BUN 51* 54*  CREATININE 2.58* 2.50*  CALCIUM 9.0 9.1  MG  --  2.2   Liver Function Tests: Recent Labs  Lab 07/25/23 1527  AST 17  ALT 21  ALKPHOS 71  BILITOT 0.6  PROT 7.8  ALBUMIN 3.9   No results for input(s): "LIPASE", "AMYLASE" in the last 168 hours. No results for input(s): "AMMONIA" in the last 168 hours. CBC: Recent Labs  Lab 07/25/23 1527 07/26/23 0537  WBC 6.3 5.3  HGB 10.4* 10.0*  HCT 34.1* 33.6*  MCV 91.9 92.1  PLT 265 264   Cardiac Enzymes: No results for input(s): "CKTOTAL", "CKMB",  "CKMBINDEX", "TROPONINI" in the last 168 hours. BNP: Invalid input(s): "POCBNP" CBG: No results for input(s): "GLUCAP" in the last 168 hours. D-Dimer Recent Labs    07/26/23 0900  DDIMER 0.83*   Hgb A1c No results for input(s): "HGBA1C" in the last 72 hours. Lipid Profile No results for input(s): "CHOL", "HDL", "LDLCALC", "TRIG", "CHOLHDL", "LDLDIRECT" in the last 72 hours. Thyroid function studies No results for input(s): "TSH", "T4TOTAL", "T3FREE", "THYROIDAB" in the last 72 hours.  Invalid input(s): "FREET3" Anemia work up No results for input(s): "VITAMINB12", "FOLATE", "FERRITIN", "TIBC", "IRON", "RETICCTPCT" in the last 72 hours. Urinalysis    Component Value Date/Time   COLORURINE YELLOW 05/15/2023 1648   APPEARANCEUR HAZY (A) 05/15/2023 1648   LABSPEC 1.014 05/15/2023 1648   PHURINE 6.0 05/15/2023 1648   GLUCOSEU 150 (A) 05/15/2023 1648   HGBUR MODERATE (A) 05/15/2023 1648   BILIRUBINUR NEGATIVE 05/15/2023 1648   BILIRUBINUR negative 11/16/2020 1725   KETONESUR NEGATIVE 05/15/2023 1648   PROTEINUR 100 (A) 05/15/2023 1648   UROBILINOGEN 0.2 11/16/2020 1725   UROBILINOGEN 0.2 05/12/2013 1025   NITRITE NEGATIVE 05/15/2023 1648   LEUKOCYTESUR MODERATE (A) 05/15/2023 1648   Sepsis Labs Recent Labs  Lab 07/25/23 1527 07/26/23 0537  WBC 6.3 5.3   Microbiology No results found for this or any previous visit (from the past 240 hour(s)).  Procedures/Studies: DG Chest 2 View  Result Date: 07/25/2023 CLINICAL DATA:  Chest pain and shortness of breath for 3 days. EXAM: CHEST - 2 VIEW COMPARISON:  Chest radiographs 05/15/2023 and 12/04/2020 FINDINGS: Cardiac silhouette is again mildly enlarged. Mediastinal contours are within normal limits. The lungs are clear. No pleural effusion pneumothorax. Minimal dextrocurvature of the mid to lower thoracic spine with mild-to-moderate multilevel degenerative disc changes. IMPRESSION: 1. No active cardiopulmonary disease. 2. Mild  cardiomegaly. Electronically Signed   By: Neita Garnet M.D.   On: 07/25/2023 16:29  Time coordinating discharge: Over 30 minutes    Lewie Chamber, MD  [facility] 07/26/2023, 1:50 PM

## 2023-07-30 ENCOUNTER — Telehealth: Payer: Self-pay

## 2023-07-30 NOTE — Transitions of Care (Post Inpatient/ED Visit) (Signed)
07/30/2023  Name: [name] MRN: [id number] DOB: October 09, 1960  Today's TOC FU Call Status: Today's TOC FU Call Status:: Successful TOC FU Call Completed TOC FU Call Complete Date: 07/30/23 Patient's Name and Date of Birth confirmed.  Transition Care Management Follow-up Telephone Call Date of Discharge: 07/26/23 Discharge Facility: [name]) Type of Discharge: Inpatient Admission Primary Inpatient Discharge Diagnosis:: Acute on chronic heart failure with preserved ejection fraction How have you been since you were released from the hospital?: Same Any questions or concerns?: No  Items Reviewed: Did you receive and understand the discharge instructions provided?: Yes Medications obtained,verified, and reconciled?: Yes (Medications Reviewed) Any new allergies since your discharge?: No Dietary orders reviewed?: No Do you have support at home?: Yes People in Home: child(ren), dependent  Medications Reviewed Today: Medications Reviewed Today     Reviewed by Marlyn Corporal, CMA (Certified Medical Assistant) on 07/30/23 at 1619  Med List Status: <None>   Medication Order Taking? Sig Documenting Provider Last Dose Status Informant  acetaminophen (TYLENOL) 325 MG tablet [id number] Yes Take 650 mg by mouth every 6 (six) hours as needed for mild pain or headache. [provider] Taking Active Self  amLODipine (NORVASC) 10 MG tablet [id number] Yes Take 1 tablet (10 mg total) by mouth daily. Ellender Hose, NP Taking Active Self           Med Note Lenoria Farrier   Tue Apr 10, 2023  2:30 PM)    aspirin EC 81 MG tablet [id number] Yes Take 1 tablet (81 mg total) by mouth 2 (two) times daily. For blood clot prevention. Take for 30 days after surgery. Swallow whole.  Patient taking differently: Take 81 mg by mouth daily. For blood clot prevention. Take for 30 days after surgery. Swallow whole.   [place], Jesse J, PA-C Taking Active Self  atorvastatin (LIPITOR) 80 MG tablet  [id number] Yes Take 1 tablet (80 mg total) by mouth daily. Chilton Si, MD Taking Active Self  carvedilol (COREG) 12.5 MG tablet [id number] Yes Take 1 tablet (12.5 mg total) by mouth 2 (two) times daily with a meal. Chilton Si, MD Taking Active Self  FARXIGA 10 MG TABS tablet [id number] Yes Take 1 tablet (10 mg total) by mouth daily. Ellender Hose, NP Taking Active Self  ferrous gluconate (FERGON) 324 MG tablet [id number] Yes Take 1 tablet (324 mg total) by mouth daily with breakfast. Rai, Delene Ruffini, MD Taking Active Self  furosemide (LASIX) 40 MG tablet [id number] Yes Take 1 tablet (40 mg total) by mouth daily. Rodolph Bong, MD Taking Active Self  hydrALAZINE (APRESOLINE) 50 MG tablet [id number] Yes Take 1 tablet (50 mg total) by mouth 3 (three) times daily. Rai, Delene Ruffini, MD Taking Active Self  isosorbide mononitrate (IMDUR) 60 MG 24 hr tablet [id number] Yes Take 1 tablet (60 mg total) by mouth daily.  Patient taking differently: Take 60 mg by mouth daily as needed.   Rai, Delene Ruffini, MD Taking Active Self  loperamide (IMODIUM) 2 MG capsule [id number] Yes Take 1 capsule (2 mg total) by mouth as needed for diarrhea or loose stools. Rodolph Bong, MD Taking Active Self  melatonin 3 MG TABS tablet [id number] Yes Take 1 tablet (3 mg total) by mouth at bedtime as needed. [name], FNP Taking Active Self  methocarbamol (ROBAXIN) 500 MG tablet [id number] Yes Take 500 mg by mouth every 6 (six) hours as needed. [provider] Taking Active Self  oxyCODONE (ROXICODONE) 5 MG immediate release tablet [id number]  Yes Take 1 tablet (5 mg total) by mouth every 4 (four) hours as needed for severe pain. Rodolph Bong, MD Taking Active Self  polyethylene glycol (MIRALAX / GLYCOLAX) 17 g packet [id number] Yes Take 17 g by mouth daily as needed for mild constipation or moderate constipation. [provider] Taking Active Self  spironolactone (ALDACTONE) 25 MG tablet [id number]  Yes TAKE 1 TABLET BY MOUTH DAILY [name], FNP Taking Active Self            Home Care and Equipment/Supplies: Were Home Health Services Ordered?: No Any new equipment or medical supplies ordered?: No  Functional Questionnaire: Do you need assistance with bathing/showering or dressing?: No Do you need assistance with meal preparation?: No Do you need assistance with eating?: No Do you have difficulty maintaining continence: No Do you need assistance with getting out of bed/getting out of a chair/moving?: No Do you have difficulty managing or taking your medications?: No  Follow up appointments reviewed: PCP Follow-up appointment confirmed?: Yes Date of PCP follow-up appointment?: 08/01/23 Follow-up Provider: [facility] Follow-up appointment confirmed?: No Do you need transportation to your follow-up appointment?: No Do you understand care options if your condition(s) worsen?: Yes-patient verbalized understanding    SIGNATURE Lisabeth Devoid, CMA

## 2023-07-31 DIAGNOSIS — R809 Proteinuria, unspecified: Secondary | ICD-10-CM | POA: Diagnosis not present

## 2023-07-31 DIAGNOSIS — N184 Chronic kidney disease, stage 4 (severe): Secondary | ICD-10-CM | POA: Diagnosis not present

## 2023-07-31 DIAGNOSIS — I509 Heart failure, unspecified: Secondary | ICD-10-CM | POA: Diagnosis not present

## 2023-07-31 DIAGNOSIS — E269 Hyperaldosteronism, unspecified: Secondary | ICD-10-CM | POA: Diagnosis not present

## 2023-07-31 DIAGNOSIS — I129 Hypertensive chronic kidney disease with stage 1 through stage 4 chronic kidney disease, or unspecified chronic kidney disease: Secondary | ICD-10-CM | POA: Diagnosis not present

## 2023-07-31 DIAGNOSIS — D631 Anemia in chronic kidney disease: Secondary | ICD-10-CM | POA: Diagnosis not present

## 2023-07-31 DIAGNOSIS — N2581 Secondary hyperparathyroidism of renal origin: Secondary | ICD-10-CM | POA: Diagnosis not present

## 2023-08-01 ENCOUNTER — Encounter: Payer: Self-pay | Admitting: Nurse Practitioner

## 2023-08-01 ENCOUNTER — Ambulatory Visit (INDEPENDENT_AMBULATORY_CARE_PROVIDER_SITE_OTHER): Payer: 59 | Admitting: Nurse Practitioner

## 2023-08-01 VITALS — BP 120/64 | HR 71 | Temp 98.1°F | Ht 68.0 in | Wt 220.4 lb

## 2023-08-01 DIAGNOSIS — I5033 Acute on chronic diastolic (congestive) heart failure: Secondary | ICD-10-CM | POA: Diagnosis not present

## 2023-08-01 DIAGNOSIS — M545 Low back pain, unspecified: Secondary | ICD-10-CM | POA: Diagnosis not present

## 2023-08-01 DIAGNOSIS — I110 Hypertensive heart disease with heart failure: Secondary | ICD-10-CM | POA: Diagnosis not present

## 2023-08-01 DIAGNOSIS — I10 Essential (primary) hypertension: Secondary | ICD-10-CM

## 2023-08-01 DIAGNOSIS — E782 Mixed hyperlipidemia: Secondary | ICD-10-CM | POA: Diagnosis not present

## 2023-08-01 DIAGNOSIS — I5089 Other heart failure: Secondary | ICD-10-CM | POA: Diagnosis not present

## 2023-08-01 DIAGNOSIS — Z2821 Immunization not carried out because of patient refusal: Secondary | ICD-10-CM | POA: Diagnosis not present

## 2023-08-01 DIAGNOSIS — Z09 Encounter for follow-up examination after completed treatment for conditions other than malignant neoplasm: Secondary | ICD-10-CM

## 2023-08-01 MED ORDER — OXYCODONE HCL 5 MG PO TABS: 5.0000 mg | ORAL_TABLET | ORAL | 0 refills | Status: DC | PRN

## 2023-08-01 NOTE — Progress Notes (Unsigned)
Madelaine Bhat, CMA,acting as a Neurosurgeon for [name], FNP.,have documented all relevant documentation on the behalf of [name], FNP,as directed by  [name], FNP while in the presence of [name], FNP.  Subjective:  Patient ID: [name] , female    DOB: 05-Oct-1960 , 62 y.o.   MRN: [id number]  Chief Complaint  Patient presents with   Hospitalization Follow-up    HPI  Patient presents today for a hospital follow up, Patient reports compliance with medication. Patient denies any chest pain, SOB, or headaches. Patient has no concerns today. Patient was admitted on 07/25/2023 and discharged 07/26/2023. Patient was admitted for Acute on chronic heart failure with preserved ejection fraction Patient reports today she is feeling a lot of pain in her back she is unsure of the cause, she states she thinks the boot is too heavy. Her nephrologist stopped her farxiga for 5 days and to take furosemide twice a day for 5 days then she will go back to one tab daily. She goes to orthopedics on Monday for her right ankle fracture, she has a walking boot.   Back Pain This is a chronic problem. The current episode started 1 to 4 weeks ago. The quality of the pain is described as aching and cramping. The pain does not radiate. Pertinent negatives include no abdominal pain, chest pain or headaches.     Past Medical History:  Diagnosis Date   Adrenal adenoma 01/11/2021   Anemia    CHF (congestive heart failure) (HCC)    Chronic bronchitis (HCC)    "probably once/yr" (05/12/2013)   Chronic kidney disease    Diastolic heart failure    Dyslipidemia    pt denies this hx on 05/12/2013   Exertional shortness of breath    GERD (gastroesophageal reflux disease)    Heart murmur    Hypertension    Hypokalemia    Left ventricular hypertrophy 01/11/2021   Venous stasis ulcers (HCC)      Family History  Problem Relation Age of Onset   Hypertension Mother    Kidney failure Mother    Heart  attack Father    Hypertension Sister    Diabetes Sister    Hypertension Brother    Diabetes Sister      Current Outpatient Medications:    acetaminophen (TYLENOL) 325 MG tablet, Take 650 mg by mouth every 6 (six) hours as needed for mild pain or headache., Disp: , Rfl:    amLODipine (NORVASC) 10 MG tablet, Take 1 tablet (10 mg total) by mouth daily., Disp: 90 tablet, Rfl: 1   aspirin EC 81 MG tablet, Take 1 tablet (81 mg total) by mouth 2 (two) times daily. For blood clot prevention. Take for 30 days after surgery. Swallow whole. (Patient taking differently: Take 81 mg by mouth daily. For blood clot prevention. Take for 30 days after surgery. Swallow whole.), Disp: 60 tablet, Rfl: 0   atorvastatin (LIPITOR) 80 MG tablet, Take 1 tablet (80 mg total) by mouth daily., Disp: 90 tablet, Rfl: 3   carvedilol (COREG) 12.5 MG tablet, Take 1 tablet (12.5 mg total) by mouth 2 (two) times daily with a meal., Disp: 180 tablet, Rfl: 3   FARXIGA 10 MG TABS tablet, Take 1 tablet (10 mg total) by mouth daily., Disp: 90 tablet, Rfl: 1   ferrous gluconate (FERGON) 324 MG tablet, Take 1 tablet (324 mg total) by mouth daily with breakfast., Disp: 30 tablet, Rfl: 3   furosemide (LASIX) 40 MG tablet,  Take 1 tablet (40 mg total) by mouth daily., Disp: 30 tablet, Rfl: 1   hydrALAZINE (APRESOLINE) 50 MG tablet, Take 1 tablet (50 mg total) by mouth 3 (three) times daily., Disp: 90 tablet, Rfl: 2   isosorbide mononitrate (IMDUR) 60 MG 24 hr tablet, Take 1 tablet (60 mg total) by mouth daily. (Patient taking differently: Take 60 mg by mouth daily as needed.), Disp: 30 tablet, Rfl: 3   loperamide (IMODIUM) 2 MG capsule, Take 1 capsule (2 mg total) by mouth as needed for diarrhea or loose stools., Disp: , Rfl:    melatonin 3 MG TABS tablet, Take 1 tablet (3 mg total) by mouth at bedtime as needed., Disp: 90 tablet, Rfl: 1   methocarbamol (ROBAXIN) 500 MG tablet, Take 500 mg by mouth every 6 (six) hours as needed., Disp: ,  Rfl:    oxyCODONE (ROXICODONE) 5 MG immediate release tablet, Take 1 tablet (5 mg total) by mouth every 4 (four) hours as needed for severe pain., Disp: 12 tablet, Rfl: 0   polyethylene glycol (MIRALAX / GLYCOLAX) 17 g packet, Take 17 g by mouth daily as needed for mild constipation or moderate constipation., Disp: , Rfl:    spironolactone (ALDACTONE) 25 MG tablet, TAKE 1 TABLET BY MOUTH DAILY, Disp: 90 tablet, Rfl: 1   Allergies  Allergen Reactions   Lisinopril Cough     Review of Systems  Constitutional: Negative.   Respiratory: Negative.    Cardiovascular: Negative.  Negative for chest pain, palpitations and leg swelling.  Gastrointestinal: Negative.  Negative for abdominal pain.  Musculoskeletal:  Positive for back pain.       Walking boot  Neurological: Negative.  Negative for headaches.  Psychiatric/Behavioral: Negative.       Today's Vitals   08/01/23 1422  BP: 120/64  Pulse: 71  Temp: 98.1 F (36.7 C)  TempSrc: Oral  Weight: 220 lb 6.4 oz (100 kg)  Height: 5\' 8"  (1.727 m)  PainSc: 10-Worst pain ever  PainLoc: Back   Body mass index is 33.51 kg/m.  Wt Readings from Last 3 Encounters:  08/01/23 220 lb 6.4 oz (100 kg)  07/26/23 215 lb 13.3 oz (97.9 kg)  06/06/23 215 lb (97.5 kg)    The 10-year ASCVD risk score (Arnett DK, et al., 2019) is: 11.2%   Values used to calculate the score:     Age: 55 years     Sex: Female     Is Non-Hispanic African American: Yes     Diabetic: No     Tobacco smoker: Yes     Systolic Blood Pressure: 120 mmHg     Is BP treated: Yes     HDL Cholesterol: 73 mg/dL     Total Cholesterol: 202 mg/dL  Objective:  Physical Exam Vitals reviewed.  Constitutional:      Appearance: Normal appearance.  Cardiovascular:     Rate and Rhythm: Normal rate and regular rhythm.     Pulses: Normal pulses.     Heart sounds: Normal heart sounds. No murmur heard. Pulmonary:     Effort: Pulmonary effort is normal. No respiratory distress.     Breath  sounds: Normal breath sounds. No stridor. No wheezing.  Musculoskeletal:        General: No swelling or tenderness.  Skin:    General: Skin is warm and dry.  Neurological:     General: No focal deficit present.     Mental Status: She is alert and oriented to person, place, and time.  Psychiatric:        Mood and Affect: Mood normal.        Behavior: Behavior normal.        Thought Content: Thought content normal.        Judgment: Judgment normal.         Assessment And Plan:  Hospital discharge follow-up  Acute on chronic heart failure with preserved ejection fraction (HFpEF) (HCC)  COVID-19 vaccination declined  Acute bilateral low back pain without sciatica  Essential hypertension  Mixed hyperlipidemia    Return for uncontrolled bp 3-105months check.  Patient was given opportunity to ask questions. Patient verbalized understanding of the plan and was able to repeat key elements of the plan. All questions were answered to their satisfaction.    Jeanell Sparrow, FNP, have reviewed all documentation for this visit. The documentation on 08/01/23 for the exam, diagnosis, procedures, and orders are all accurate and complete.   IF YOU HAVE BEEN REFERRED TO A SPECIALIST, IT MAY TAKE 1-2 WEEKS TO SCHEDULE/PROCESS THE REFERRAL. IF YOU HAVE NOT HEARD FROM US/SPECIALIST IN TWO WEEKS, PLEASE GIVE [place] A CALL AT [phone].

## 2023-08-02 DIAGNOSIS — I130 Hypertensive heart and chronic kidney disease with heart failure and stage 1 through stage 4 chronic kidney disease, or unspecified chronic kidney disease: Secondary | ICD-10-CM | POA: Diagnosis not present

## 2023-08-02 DIAGNOSIS — S82891D Other fracture of right lower leg, subsequent encounter for closed fracture with routine healing: Secondary | ICD-10-CM | POA: Diagnosis not present

## 2023-08-02 DIAGNOSIS — N184 Chronic kidney disease, stage 4 (severe): Secondary | ICD-10-CM | POA: Diagnosis not present

## 2023-08-02 DIAGNOSIS — I872 Venous insufficiency (chronic) (peripheral): Secondary | ICD-10-CM | POA: Diagnosis not present

## 2023-08-02 DIAGNOSIS — D631 Anemia in chronic kidney disease: Secondary | ICD-10-CM | POA: Diagnosis not present

## 2023-08-02 DIAGNOSIS — I5033 Acute on chronic diastolic (congestive) heart failure: Secondary | ICD-10-CM | POA: Diagnosis not present

## 2023-08-08 ENCOUNTER — Other Ambulatory Visit: Payer: Self-pay

## 2023-08-08 DIAGNOSIS — D631 Anemia in chronic kidney disease: Secondary | ICD-10-CM | POA: Diagnosis not present

## 2023-08-08 DIAGNOSIS — S82891D Other fracture of right lower leg, subsequent encounter for closed fracture with routine healing: Secondary | ICD-10-CM | POA: Diagnosis not present

## 2023-08-08 DIAGNOSIS — I130 Hypertensive heart and chronic kidney disease with heart failure and stage 1 through stage 4 chronic kidney disease, or unspecified chronic kidney disease: Secondary | ICD-10-CM | POA: Diagnosis not present

## 2023-08-08 DIAGNOSIS — I5033 Acute on chronic diastolic (congestive) heart failure: Secondary | ICD-10-CM | POA: Diagnosis not present

## 2023-08-08 DIAGNOSIS — N184 Chronic kidney disease, stage 4 (severe): Secondary | ICD-10-CM | POA: Diagnosis not present

## 2023-08-08 DIAGNOSIS — I872 Venous insufficiency (chronic) (peripheral): Secondary | ICD-10-CM | POA: Diagnosis not present

## 2023-08-09 ENCOUNTER — Ambulatory Visit: Payer: Self-pay

## 2023-08-09 NOTE — Patient Outreach (Signed)
  Care Coordination   Follow Up Visit Note   08/09/2023 Name: Braylei Ellenwood MRN: [id number] DOB: January 27, 1961  Altina Boisselle is a 62 y.o. year old female who sees [name], FNP for primary care. I spoke with  [name] by phone today.  What matters to the patients health and wellness today?  Patient would like to obtain a new weight scale for her home.     Goals Addressed             This Visit's Progress    To better manage CHF       Care Coordination Interventions: Basic overview and discussion of pathophysiology of Heart Failure reviewed Provided education on low sodium diet Reviewed Heart Failure Action Plan in depth and provided written copy Assessed need for readable accurate scales in home Provided education about placing scale on hard, flat surface Advised patient to weigh each morning after emptying bladder Discussed importance of daily weight and advised patient to weigh and record daily Reviewed role of diuretics in prevention of fluid overload and management of heart failure; Discussed the importance of keeping all appointments with provider       Interventions Today    Flowsheet Row Most Recent Value  Chronic Disease   Chronic disease during today's visit Congestive Heart Failure (CHF), Hypertension (HTN)  General Interventions   General Interventions Discussed/Reviewed General Interventions Discussed, General Interventions Reviewed, Doctor Visits  Doctor Visits Discussed/Reviewed Doctor Visits Discussed, Doctor Visits Reviewed, PCP, Specialist  Education Interventions   Education Provided Provided Education, Provided Printed Education  Provided Verbal Education On When to see the doctor, Medication  Pharmacy Interventions   Pharmacy Dicussed/Reviewed Pharmacy Topics Discussed, Pharmacy Topics Reviewed, Medications and their functions          SDOH assessments and interventions completed:  No     Care Coordination Interventions:   Yes, provided   Follow up plan: Follow up call scheduled for 09/10/23 @10 :30 AM    Encounter Outcome:  Patient Visit Completed

## 2023-08-09 NOTE — Patient Instructions (Signed)
Visit Information  Thank you for taking time to visit with me today. Please don't hesitate to contact me if I can be of assistance to you.   Following are the goals we discussed today:   Goals Addressed             This Visit's Progress    To better manage CHF       Care Coordination Interventions: Basic overview and discussion of pathophysiology of Heart Failure reviewed Provided education on low sodium diet Reviewed Heart Failure Action Plan in depth and provided written copy Assessed need for readable accurate scales in home Provided education about placing scale on hard, flat surface Advised patient to weigh each morning after emptying bladder Discussed importance of daily weight and advised patient to weigh and record daily Reviewed role of diuretics in prevention of fluid overload and management of heart failure; Discussed the importance of keeping all appointments with provider           Our next appointment is by telephone on 09/10/23 at 10:30 AM  Please call the care guide team at [phone] if you need to cancel or reschedule your appointment.   If you are experiencing a Mental Health or Behavioral Health Crisis or need someone to talk to, please call 1-800-273-TALK (toll free, 24 hour hotline)  The patient verbalized understanding of instructions, educational materials, and care plan provided today and DECLINED offer to receive copy of patient instructions, educational materials, and care plan.   Delsa Sale RN BSN CCM [facility], [facility] Nurse Care Coordinator  Direct Dial: [phone] Website: [email or web address]

## 2023-08-13 ENCOUNTER — Other Ambulatory Visit: Payer: 59 | Admitting: Pharmacist

## 2023-08-13 VITALS — BP 128/78

## 2023-08-13 DIAGNOSIS — I10 Essential (primary) hypertension: Secondary | ICD-10-CM

## 2023-08-13 MED ORDER — FERROUS GLUCONATE 324 (38 FE) MG PO TABS: 324.0000 mg | ORAL_TABLET | Freq: Every day | ORAL | 3 refills | Status: DC

## 2023-08-13 NOTE — Progress Notes (Cosign Needed)
08/13/2023 Name: [name] MRN: [id number] DOB: 1961/06/27  Chief Complaint  Patient presents with   Medication Management   Diabetes    [name] is a 62 y.o. year old female who presented for a telephone visit.   They were referred to the pharmacist by their PCP for assistance in managing hypertension and hyperlipidemia.    Subjective:  Care Team: Primary Care Provider: [name], FNP ; Next Scheduled Visit: 10/30/23 Cardiologist: [name]; Next Scheduled Visit: 09/05/22  Medication Access/Adherence  Current Pharmacy:  [id number] [place] - [address] AT [facility] [address] [place] [address] Phone: [phone] Fax: [phone]   Patient reports affordability concerns with their medications: No  Patient reports access/transportation concerns to their pharmacy: No  Patient reports adherence concerns with their medications:  No    Previously discussed adherence packaging but patient preferred to switch back to bottles from [facility]. Has a system of organizing medication bottles in a tupperware. Denies missed doses recently.    Hyperlipidemia/ASCVD Risk Reduction  Current lipid lowering medications: atorvastatin 80 mg daily  Antiplatelet regimen: aspirin 81 mg daily   Clinical ASCVD: No  The 10-year ASCVD risk score (Arnett DK, et al., 2019) is: 13.2%   Values used to calculate the score:     Age: 61 years     Sex: Female     Is Non-Hispanic African American: Yes     Diabetic: No     Tobacco smoker: Yes     Systolic Blood Pressure: 128 mmHg     Is BP treated: Yes     HDL Cholesterol: 73 mg/dL     Total Cholesterol: 202 mg/dL    Heart Failure (EF 13-24%):  Current medications:  ACEi/ARB/ARNI: none due to renal function SGLT2i: Farxiga 10 mg daily Beta blocker: carvedilol 12.5 mg twice daily Mineralocorticoid Receptor Antagonist: spironolactone 25 mg daily  Diuretic regimen: furosemide 40 mg daily   Additional antihypertensives: hydralazine 50 mg three times daily, amlodipine 10 mg daily   Current home blood pressure readings: 120s/80s Current home weights: reports ~215 lbs;   Patient denies volume overload signs or symptoms including shortness of breath, lower extremity edema, increased use of pillows at night  Current medication access support: Medicare + Medicaid dual enrolled   Objective:  Lab Results  Component Value Date   HGBA1C 4.9 08/23/2021    Lab Results  Component Value Date   CREATININE 2.50 (H) 07/26/2023   BUN 54 (H) 07/26/2023   NA 139 07/26/2023   K 4.2 07/26/2023   CL 106 07/26/2023   CO2 23 07/26/2023    Lab Results  Component Value Date   CHOL 202 (H) 09/21/2022   HDL 73 09/21/2022   LDLCALC 110 (H) 09/21/2022   TRIG 109 09/21/2022   CHOLHDL 2.8 09/21/2022    Medications Reviewed Today     Reviewed by [name], RPH-CPP (Pharmacist) on 08/13/23 at 1515  Med List Status: <None>   Medication Order Taking? Sig Documenting Provider Last Dose Status Informant  acetaminophen (TYLENOL) 325 MG tablet [id number]  Take 650 mg by mouth every 6 (six) hours as needed for mild pain or headache. [provider]  Active Self  amLODipine (NORVASC) 10 MG tablet [id number] Yes Take 1 tablet (10 mg total) by mouth daily. Summer Hose, NP Taking Active Self           Med Note Lenoria Farrier   Tue Apr 10, 2023  2:30 PM)  aspirin EC 81 MG tablet [id number] Yes Take 1 tablet (81 mg total) by mouth 2 (two) times daily. For blood clot prevention. Take for 30 days after surgery. Swallow whole.  Patient taking differently: Take 81 mg by mouth daily. For blood clot prevention. Take for 30 days after surgery. Swallow whole.   Chavez, Summer J, PA-C Taking Active Self  atorvastatin (LIPITOR) 80 MG tablet [id number] Yes Take 1 tablet (80 mg total) by mouth daily. Summer Si, MD Taking Active Self  carvedilol (COREG) 12.5 MG tablet [id number] Yes  Take 1 tablet (12.5 mg total) by mouth 2 (two) times daily with a meal. Summer Si, MD Taking Active Self  FARXIGA 10 MG TABS tablet [id number] Yes Take 1 tablet (10 mg total) by mouth daily. Summer Hose, NP Taking Active Self           Med Note Raeanne Gathers Aug 13, 2023  3:14 PM)    ferrous gluconate (FERGON) 324 MG tablet [id number] Yes Take 1 tablet (324 mg total) by mouth daily with breakfast. Chavez, Summer Ruffini, MD Taking Active Self  furosemide (LASIX) 40 MG tablet [id number] Yes Take 1 tablet (40 mg total) by mouth daily. Summer Bong, MD Taking Active Self  hydrALAZINE (APRESOLINE) 50 MG tablet [id number] Yes Take 1 tablet (50 mg total) by mouth 3 (three) times daily. Chavez, Summer Ruffini, MD Taking Active Self  isosorbide mononitrate (IMDUR) 60 MG 24 hr tablet [id number] No Take 1 tablet (60 mg total) by mouth daily.  Patient not taking: Reported on 08/13/2023   Summer Harsh, MD Not Taking Active Self  melatonin 3 MG TABS tablet [id number] Yes Take 1 tablet (3 mg total) by mouth at bedtime as needed. Summer Felts, FNP Taking Active Self  oxyCODONE (ROXICODONE) 5 MG immediate release tablet [id number] Yes Take 1 tablet (5 mg total) by mouth every 4 (four) hours as needed for severe pain (pain score 7-10). Summer Felts, FNP Taking Active   polyethylene glycol (MIRALAX / GLYCOLAX) 17 g packet [id number]  Take 17 g by mouth daily as needed for mild constipation or moderate constipation. [provider]  Active Self  spironolactone (ALDACTONE) 25 MG tablet [id number] Yes TAKE 1 TABLET BY MOUTH DAILY Summer Felts, FNP Taking Active Self              Assessment/Plan:   Hyperlipidemia/ASCVD Risk Reduction: - Currently uncontrolled per last lipid panel but due for recheck - Recommend to continue current regimen with focus on adherence. Follow up with cardiology as scheduled  Heart Failure: - Currently appropriately managed - Reviewed appropriate blood pressure  monitoring technique and reviewed goal blood pressure - Reviewed to weigh daily and when to contact cardiology with weight gain - Reviewed dietary modifications including avoid significant salt intake  Follow Up Plan: phone call in 6 weeks  Catie Eppie Gibson, PharmD, BCACP, CPP Clinical Pharmacist [facility] [phone]

## 2023-08-17 DIAGNOSIS — I872 Venous insufficiency (chronic) (peripheral): Secondary | ICD-10-CM | POA: Diagnosis not present

## 2023-08-17 DIAGNOSIS — N184 Chronic kidney disease, stage 4 (severe): Secondary | ICD-10-CM | POA: Diagnosis not present

## 2023-08-17 DIAGNOSIS — S82891D Other fracture of right lower leg, subsequent encounter for closed fracture with routine healing: Secondary | ICD-10-CM | POA: Diagnosis not present

## 2023-08-17 DIAGNOSIS — D631 Anemia in chronic kidney disease: Secondary | ICD-10-CM | POA: Diagnosis not present

## 2023-08-17 DIAGNOSIS — I5033 Acute on chronic diastolic (congestive) heart failure: Secondary | ICD-10-CM | POA: Diagnosis not present

## 2023-08-17 DIAGNOSIS — I130 Hypertensive heart and chronic kidney disease with heart failure and stage 1 through stage 4 chronic kidney disease, or unspecified chronic kidney disease: Secondary | ICD-10-CM | POA: Diagnosis not present

## 2023-08-20 DIAGNOSIS — I5033 Acute on chronic diastolic (congestive) heart failure: Secondary | ICD-10-CM | POA: Diagnosis not present

## 2023-08-20 DIAGNOSIS — S82891D Other fracture of right lower leg, subsequent encounter for closed fracture with routine healing: Secondary | ICD-10-CM | POA: Diagnosis not present

## 2023-08-20 DIAGNOSIS — N184 Chronic kidney disease, stage 4 (severe): Secondary | ICD-10-CM | POA: Diagnosis not present

## 2023-08-20 DIAGNOSIS — D631 Anemia in chronic kidney disease: Secondary | ICD-10-CM | POA: Diagnosis not present

## 2023-08-20 DIAGNOSIS — I872 Venous insufficiency (chronic) (peripheral): Secondary | ICD-10-CM | POA: Diagnosis not present

## 2023-08-20 DIAGNOSIS — I130 Hypertensive heart and chronic kidney disease with heart failure and stage 1 through stage 4 chronic kidney disease, or unspecified chronic kidney disease: Secondary | ICD-10-CM | POA: Diagnosis not present

## 2023-08-21 ENCOUNTER — Other Ambulatory Visit: Payer: Self-pay

## 2023-08-21 DIAGNOSIS — Z2821 Immunization not carried out because of patient refusal: Secondary | ICD-10-CM | POA: Insufficient documentation

## 2023-08-21 DIAGNOSIS — M545 Low back pain, unspecified: Secondary | ICD-10-CM | POA: Insufficient documentation

## 2023-08-21 MED ORDER — FUROSEMIDE 40 MG PO TABS: 40.0000 mg | ORAL_TABLET | Freq: Every day | ORAL | 1 refills | Status: DC

## 2023-08-21 NOTE — Assessment & Plan Note (Signed)
 TCM Performed. A member of the clinical team spoke with the patient upon dischare. Discharge summary was reviewed in full detail during the visit. Meds reconciled and compared to discharge meds. Medication list is updated and reviewed with the patient.  Greater than 50% face to face time was spent in counseling an coordination of care.  All questions were answered to the satisfaction of the patient.  Continue f/u with Cardiology

## 2023-08-21 NOTE — Assessment & Plan Note (Signed)
 Blood pressure is better controlled, continue current medications and f/u with Cardiology

## 2023-08-21 NOTE — Assessment & Plan Note (Signed)
She is now having back pain likely related staying in the hospital. Will provide a limited amount of oxycodone due to OTC medications not effective.

## 2023-08-21 NOTE — Assessment & Plan Note (Signed)
 Declines covid 19 vaccine. Discussed risk of covid 59 and if she changes her mind about the vaccine to call the office. Education has been provided regarding the importance of this vaccine but patient still declined. Advised may receive this vaccine at local pharmacy or Health Dept.or vaccine clinic. Aware to provide a copy of the vaccination record if obtained from local pharmacy or Health Dept.  Encouraged to take multivitamin, vitamin d , vitamin c and zinc to increase immune system. Aware can call office if would like to have vaccine here at office. Verbalized acceptance and understanding.

## 2023-08-21 NOTE — Assessment & Plan Note (Signed)
 Continue statin, tolerating well

## 2023-08-23 ENCOUNTER — Ambulatory Visit: Payer: 59 | Admitting: Nurse Practitioner

## 2023-08-27 ENCOUNTER — Ambulatory Visit: Payer: Self-pay | Admitting: Licensed Clinical Social Worker

## 2023-08-27 NOTE — Patient Outreach (Signed)
  Care Coordination   Follow Up Visit Note   08/27/2023 Name: [name] MRN: [id number] DOB: September 11, 1960  [name] is a 63 y.o. year old female who sees Georgina Speaks, FNP for primary care. I spoke with  [name] by phone today.  What matters to the patients health and wellness today?  Housing resources mailed    Goals Addressed             This Visit's Progress    COMPLETED: Care Coordination Activities       Care Coordination Interventions: Patient stated that she is currently living with her daughter and has been there since August and moved out of the hotel room she was living in because she has a ankle fracture at work and the daughter moved her stuff into a storage and she moved in with her daughter. 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  Patient stated that it is a lot being at her daughters because she knows that her daughter and the family need their own space and my try to go to [employer] or [place] to try to get another room. She has about $800 saved up for her to rent a room. While staying with her daughter she is assisting her daughter with bill. Patient is unsure if she can apply for Section 8 again because she had an unresolved issue/bill about 4 years ago but she is willing to contact them to check her status SW educated the patient about low income housing and will mail the patient a list of resources for housing SW will follow up with patient on 07/26/2023 at 2:00 pm         SDOH assessments and interventions completed:  Yes  SDOH Interventions Today    Flowsheet Row Most Recent Value  SDOH Interventions   Food Insecurity Interventions Intervention Not Indicated  Housing Interventions Intervention Not Indicated  Transportation Interventions Intervention Not Indicated  Utilities Interventions Intervention Not Indicated        Care  Coordination Interventions:  Yes, provided  Interventions Today    Flowsheet Row Most Recent Value  General Interventions   General Interventions Discussed/Reviewed General Interventions Reviewed, [employer] received the housing resources and will be reviewing them.Sshe is currently still living with her daughter and will no longer need a follow up call from the SW]        Follow up plan: No further intervention required.   Encounter Outcome:  Patient Visit Completed   Tobias CHARM Maranda HEDWIG, PhD [facility], [facility] Social Worker Direct Dial: [phone]  Fax: [phone]

## 2023-08-27 NOTE — Patient Instructions (Signed)
 Visit Information  Thank you for taking time to visit with me today. Please don't hesitate to contact me if I can be of assistance to you.   Following are the goals we discussed today:   Goals Addressed             This Visit's Progress    COMPLETED: Care Coordination Activities       Care Coordination Interventions: Patient stated that she is currently living with her daughter and has been there since August and moved out of the hotel room she was living in because she has a ankle fracture at work and the daughter moved her stuff into a storage and she moved in with her daughter. 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  Patient stated that it is a lot being at her daughters because she knows that her daughter and the family need their own space and my try to go to [employer] or [place] to try to get another room. She has about $800 saved up for her to rent a room. While staying with her daughter she is assisting her daughter with bill. Patient is unsure if she can apply for Section 8 again because she had an unresolved issue/bill about 4 years ago but she is willing to contact them to check her status SW educated the patient about low income housing and will mail the patient a list of resources for housing SW will follow up with patient on 07/26/2023 at 2:00 pm         No further follow up needed, Sw encouraged patient to inform the PCP if the SW is needed in the future.   Please call the care guide team at [phone] if you need to cancel or reschedule your appointment.   If you are experiencing a Mental Health or Behavioral Health Crisis or need someone to talk to, please call the Suicide and Crisis Lifeline: 988 go to [facility] [address], [place] [phone]) call 911  The patient verbalized understanding of instructions, educational  materials, and care plan provided today and DECLINED offer to receive copy of patient instructions, educational materials, and care plan.   Summer CHARM Maranda HEDWIG, PhD [facility], [facility] Social Worker Direct Dial: [phone]  Fax: [phone]

## 2023-08-29 DIAGNOSIS — I872 Venous insufficiency (chronic) (peripheral): Secondary | ICD-10-CM | POA: Diagnosis not present

## 2023-08-29 DIAGNOSIS — I130 Hypertensive heart and chronic kidney disease with heart failure and stage 1 through stage 4 chronic kidney disease, or unspecified chronic kidney disease: Secondary | ICD-10-CM | POA: Diagnosis not present

## 2023-08-29 DIAGNOSIS — N184 Chronic kidney disease, stage 4 (severe): Secondary | ICD-10-CM | POA: Diagnosis not present

## 2023-08-29 DIAGNOSIS — D631 Anemia in chronic kidney disease: Secondary | ICD-10-CM | POA: Diagnosis not present

## 2023-08-29 DIAGNOSIS — I5033 Acute on chronic diastolic (congestive) heart failure: Secondary | ICD-10-CM | POA: Diagnosis not present

## 2023-08-29 DIAGNOSIS — S82891D Other fracture of right lower leg, subsequent encounter for closed fracture with routine healing: Secondary | ICD-10-CM | POA: Diagnosis not present

## 2023-09-05 DIAGNOSIS — N184 Chronic kidney disease, stage 4 (severe): Secondary | ICD-10-CM | POA: Diagnosis not present

## 2023-09-05 DIAGNOSIS — S82891D Other fracture of right lower leg, subsequent encounter for closed fracture with routine healing: Secondary | ICD-10-CM | POA: Diagnosis not present

## 2023-09-05 DIAGNOSIS — I872 Venous insufficiency (chronic) (peripheral): Secondary | ICD-10-CM | POA: Diagnosis not present

## 2023-09-05 DIAGNOSIS — I130 Hypertensive heart and chronic kidney disease with heart failure and stage 1 through stage 4 chronic kidney disease, or unspecified chronic kidney disease: Secondary | ICD-10-CM | POA: Diagnosis not present

## 2023-09-05 DIAGNOSIS — D631 Anemia in chronic kidney disease: Secondary | ICD-10-CM | POA: Diagnosis not present

## 2023-09-05 DIAGNOSIS — I5033 Acute on chronic diastolic (congestive) heart failure: Secondary | ICD-10-CM | POA: Diagnosis not present

## 2023-09-06 ENCOUNTER — Encounter (HOSPITAL_BASED_OUTPATIENT_CLINIC_OR_DEPARTMENT_OTHER): Payer: Self-pay | Admitting: Cardiovascular Disease

## 2023-09-06 ENCOUNTER — Other Ambulatory Visit (HOSPITAL_BASED_OUTPATIENT_CLINIC_OR_DEPARTMENT_OTHER): Payer: Self-pay

## 2023-09-06 ENCOUNTER — Other Ambulatory Visit: Payer: Self-pay

## 2023-09-06 ENCOUNTER — Ambulatory Visit (INDEPENDENT_AMBULATORY_CARE_PROVIDER_SITE_OTHER): Payer: 59 | Admitting: Cardiovascular Disease

## 2023-09-06 VITALS — BP 148/60 | HR 65 | Ht 68.0 in | Wt 222.7 lb

## 2023-09-06 DIAGNOSIS — I10 Essential (primary) hypertension: Secondary | ICD-10-CM

## 2023-09-06 DIAGNOSIS — E782 Mixed hyperlipidemia: Secondary | ICD-10-CM

## 2023-09-06 DIAGNOSIS — I5033 Acute on chronic diastolic (congestive) heart failure: Secondary | ICD-10-CM | POA: Diagnosis not present

## 2023-09-06 DIAGNOSIS — Z5181 Encounter for therapeutic drug level monitoring: Secondary | ICD-10-CM | POA: Diagnosis not present

## 2023-09-06 MED ORDER — SPIRONOLACTONE 25 MG PO TABS
25.0000 mg | ORAL_TABLET | Freq: Every day | ORAL | 1 refills | Status: DC
  Filled 2023-09-06 (×2): qty 90, 90d supply, fill #0

## 2023-09-06 MED ORDER — HYDRALAZINE HCL 50 MG PO TABS
50.0000 mg | ORAL_TABLET | Freq: Three times a day (TID) | ORAL | 1 refills | Status: DC
  Filled 2023-09-06: qty 270, 90d supply, fill #0

## 2023-09-06 NOTE — Progress Notes (Signed)
Advanced Hypertension Clinic Follow-up:    Date:  09/06/2023   ID:  [name], DOB 08-14-1961, MRN [id number]  PCP:  [name], FNP  Cardiologist:  Chilton Si, MD  Nephrologist: Dr. Thedore Mins, [facility]  Referring MD: [name], FNP   CC: Hypertension  History of Present Illness:    [name] is a 63 y.o. female with a hx of anemia, chronic diastolic heart failure, CKD 4, chronic bronchitis, GERD, hypertension, hypokalemia, venous stasis ulcers, and tobacco use, here for follow up.  She first established care in the hypertension clinic 01/11/2021.  She was seen in the ED 11/2020 with a blood pressure of 213/88. She had been drinking heavily and was experiencing palpitations. She previously had an echo 08/2018 with LVEF 60-65% and grade 1 diastolic dysfunction. She had severe LVH. When she last saw her PCP 10/2020, her blood pressure was uncontrolled and she was referred to cardiology.  She reported fatigue, and believed this is due to Norvasc. She has had hypertension since she was in her 63's. In 2018 she was noted to have an adrenal adenoma.  Labs at that time were consistent with hyperaldosteronism.  She was referred to Dr. Othella Boyer at [facility]  She was seen at [facility] and underwent adrenal vein sampling 02/2021 which was consistent with hypersecretion of aldosterone on the right. She confirms that she never proceeded with laparascopic R adrenalectomy.  At her visit 05/2023 she was recently hospitalized for 6 days with pericardial effusion, diverticulitis.  At follow up she was slowing improving.  Carvedilol and atorvastatin were increased.  She remained unsure about removing her adrenal adenoma.  She was admitted 07/2023 with acute on chronic HFpEF treated with IV lasix.  Ms. Trana presents with ongoing problems related to her ankle. Despite the doctor's reassurance of healing, the patient reports persistent swelling, which has necessitated a transition from a boot to a  brace. The swelling has been severe enough to prevent the patient from wearing her shoe. The patient denies any swelling in the left leg.  She also reports recent issues with medication refills, specifically hydralazine and spironolactone, which has led to a temporary cessation of these medications. Prior to running out, the patient's blood pressure was reportedly well-controlled, but has since increased to 150/67.  She also mentions an increase in weight, which she attributes to a temporary lack of furosemide, her fluid medication. She denies any changes in her breathing or other symptoms of fluid overload.  Lastly, the patient reports awaiting new glasses due to difficulty seeing close up, which has been causing some frustration. She denies any other new or concerning symptoms.     Previous antihypertensives: Lisinopril  Past Medical History:  Diagnosis Date   Adrenal adenoma 01/11/2021   Anemia    CHF (congestive heart failure) (HCC)    Chronic bronchitis (HCC)    "probably once/yr" (05/12/2013)   Chronic kidney disease    Diastolic heart failure    Dyslipidemia    pt denies this hx on 05/12/2013   Exertional shortness of breath    GERD (gastroesophageal reflux disease)    Heart murmur    Hypertension    Hypokalemia    Left ventricular hypertrophy 01/11/2021   Venous stasis ulcers (HCC)     Past Surgical History:  Procedure Laterality Date   BIOPSY  02/23/2023   Procedure: BIOPSY;  Surgeon: Lemar Lofty., MD;  Location: [facility] ENDOSCOPY;  Service: Gastroenterology;;   COLONOSCOPY N/A 02/23/2023   Procedure: COLONOSCOPY;  Surgeon:  Mansouraty, Netty Starring., MD;  Location: [facility] ENDOSCOPY;  Service: Gastroenterology;  Laterality: N/A;   ESOPHAGOGASTRODUODENOSCOPY N/A 02/23/2023   Procedure: ESOPHAGOGASTRODUODENOSCOPY (EGD);  Surgeon: Lemar Lofty., MD;  Location: [facility] ENDOSCOPY;  Service: Gastroenterology;  Laterality: N/A;   HARDWARE REMOVAL Right 05/08/2023   Procedure:  HARDWARE REMOVAL MEDIAL AND LATERAL;  Surgeon: Terance Hart, MD;  Location: [facility] ORS;  Service: Orthopedics;  Laterality: Right;   HOT HEMOSTASIS N/A 02/23/2023   Procedure: HOT HEMOSTASIS (ARGON PLASMA COAGULATION/BICAP);  Surgeon: Lemar Lofty., MD;  Location: [facility] ENDOSCOPY;  Service: Gastroenterology;  Laterality: N/A;   IR US GUIDE VASC ACCESS RIGHT  02/27/2017   IR VENOGRAM ADRENAL BI  02/27/2017   IR VENOGRAM HEPATIC WO HEMODYNAMIC EVALUATION  02/27/2017   IR VENOGRAM RENAL BI  02/27/2017   IR VENOUS SAMPLING  02/27/2017   IR VENOUS SAMPLING  02/27/2017   ORIF ANKLE FRACTURE Right 04/18/2023   Procedure: OPEN REDUCTION INTERNAL FIXATION (ORIF) ANKLE FRACTURE;  Surgeon: Joen Laura, MD;  Location: [facility] ORS;  Service: Orthopedics;  Laterality: Right;   ORIF ANKLE FRACTURE Right 05/08/2023   Procedure: REVISION OPEN REDUCTION INTERNAL FIXATION (ORIF) OF RIGHT TRIMALLEOLAR ANKLE FRACTURE, OPEN TREATMENT OF SYNDESMOSIS;  Surgeon: Terance Hart, MD;  Location: [facility] ORS;  Service: Orthopedics;  Laterality: Right;   POLYPECTOMY  02/23/2023   Procedure: POLYPECTOMY;  Surgeon: Meridee Score Netty Starring., MD;  Location: [facility] ENDOSCOPY;  Service: Gastroenterology;;   SYNDESMOSIS REPAIR Right 05/08/2023   Procedure: SYNDESMOSIS REPAIR;  Surgeon: Terance Hart, MD;  Location: [facility] ORS;  Service: Orthopedics;  Laterality: Right;   VAGINAL HYSTERECTOMY  1990's    Current Medications: Current Meds  Medication Sig   acetaminophen (TYLENOL) 325 MG tablet Take 650 mg by mouth every 6 (six) hours as needed for mild pain or headache.   amLODipine (NORVASC) 10 MG tablet Take 1 tablet (10 mg total) by mouth daily.   aspirin EC 81 MG tablet Take 1 tablet (81 mg total) by mouth 2 (two) times daily. For blood clot prevention. Take for 30 days after surgery. Swallow whole. (Patient taking differently: Take 81 mg by mouth daily. For blood clot prevention. Take for 30 days after surgery. Swallow  whole.)   atorvastatin (LIPITOR) 80 MG tablet Take 1 tablet (80 mg total) by mouth daily.   carvedilol (COREG) 12.5 MG tablet Take 1 tablet (12.5 mg total) by mouth 2 (two) times daily with a meal.   FARXIGA 10 MG TABS tablet Take 1 tablet (10 mg total) by mouth daily.   ferrous gluconate (FERGON) 324 MG tablet Take 1 tablet (324 mg total) by mouth daily with breakfast.   furosemide (LASIX) 40 MG tablet Take 1 tablet (40 mg total) by mouth daily.   melatonin 3 MG TABS tablet Take 1 tablet (3 mg total) by mouth at bedtime as needed.   oxyCODONE (ROXICODONE) 5 MG immediate release tablet Take 1 tablet (5 mg total) by mouth every 4 (four) hours as needed for severe pain (pain score 7-10).   polyethylene glycol (MIRALAX / GLYCOLAX) 17 g packet Take 17 g by mouth daily as needed for mild constipation or moderate constipation.   [DISCONTINUED] hydrALAZINE (APRESOLINE) 50 MG tablet Take 1 tablet (50 mg total) by mouth 3 (three) times daily.   [DISCONTINUED] spironolactone (ALDACTONE) 25 MG tablet TAKE 1 TABLET BY MOUTH DAILY     Allergies:   Lisinopril   Social History   Socioeconomic History   Marital status: Single  Spouse name: Not on file   Number of children: Not on file   Years of education: Not on file   Highest education level: Not on file  Occupational History   Occupation: disability  Tobacco Use   Smoking status: Every Day    Current packs/day: 0.50    Average packs/day: 0.5 packs/day for 10.0 years (5.0 ttl pk-yrs)    Types: Cigarettes   Smokeless tobacco: Never   Tobacco comments:    trying to cut back, 3/29 - only had one cigarette today, average 4 a day    07/25/23 cut back to 1 a day  Vaping Use   Vaping status: Never Used  Substance and Sexual Activity   Alcohol use: Not Currently   Drug use: Not Currently    Types: Cocaine    Comment: 05/12/2013 "tried it a few days ago; didn't like it"   Sexual activity: Not Currently  Other Topics Concern   Not on file  Social  History Narrative   Not on file   Social Drivers of Health   Financial Resource Strain: High Risk (11/06/2022)   Overall Financial Resource Strain (CARDIA)    Difficulty of Paying Living Expenses: Very hard  Food Insecurity: No Food Insecurity (08/27/2023)   Hunger Vital Sign    Worried About Running Out of Food in the Last Year: Never true    Ran Out of Food in the Last Year: Never true  Transportation Needs: No Transportation Needs (08/27/2023)   PRAPARE - Administrator, Civil Service (Medical): No    Lack of Transportation (Non-Medical): No  Physical Activity: Insufficiently Active (11/06/2022)   Exercise Vital Sign    Days of Exercise per Week: 5 days    Minutes of Exercise per Session: 20 min  Stress: No Stress Concern Present (11/06/2022)   [employer] of Occupational Health - Occupational Stress Questionnaire    Feeling of Stress : Only a little  Social Connections: Moderately Isolated (11/06/2022)   Social Connection and Isolation Panel [NHANES]    Frequency of Communication with Friends and Family: Twice a week    Frequency of Social Gatherings with Friends and Family: Once a week    Attends Religious Services: More than 4 times per year    Active Member of [employer] or Organizations: No    Attends Banker Meetings: Never    Marital Status: Never married     Family History: The patient's family history includes Diabetes in her sister and sister; Heart attack in her father; Hypertension in her brother, mother, and sister; Kidney failure in her mother.  ROS:   Please see the history of present illness.    (+) Prior fall 03/2023 with right ankle fracture (+) Generalized soreness/myalgias All other systems reviewed and are negative.  EKGs/Labs/Other Studies Reviewed:    Echo  05/16/2023:  1. Left ventricular ejection fraction, by estimation, is 60 to 65%. The  left ventricle has normal function. The left ventricle has no regional  wall motion  abnormalities. There is severe concentric left ventricular  hypertrophy. Left ventricular diastolic   parameters are consistent with Grade I diastolic dysfunction (impaired  relaxation). Elevated left ventricular end-diastolic pressure.   2. Right ventricular systolic function is normal. The right ventricular  size is normal. Tricuspid regurgitation signal is inadequate for assessing  PA pressure.   3. Left atrial size was severely dilated.   4. Right atrial size was mildly dilated.   5. The mitral valve is normal  in structure. No evidence of mitral valve  regurgitation. No evidence of mitral stenosis.   6. The aortic valve is tricuspid. Aortic valve regurgitation is mild.  Aortic valve sclerosis/calcification is present, without any evidence of  aortic stenosis.   7. The inferior vena cava is normal in size with greater than 50%  respiratory variability, suggesting right atrial pressure of 3 mmHg.   US Arterial Seg Multiple LE 11/05/2020: Normal exam. No evidence of hemodynamically significant peripheral arterial disease.  Echo TTE 09/06/2018: - Left ventricle: The cavity size was normal. Wall thickness was    increased in a pattern of severe LVH. Systolic function was    normal. The estimated ejection fraction was in the range of 60%    to 65%. Wall motion was normal; there were no regional wall    motion abnormalities. Doppler parameters are consistent with    abnormal left ventricular relaxation (grade 1 diastolic    dysfunction).  - Aortic valve: There was no stenosis. There was trivial    regurgitation.  - Mitral valve: There was trivial regurgitation.  - Left atrium: The atrium was moderately dilated.  - Right ventricle: The cavity size was normal. Systolic function    was normal.  - Right atrium: The atrium was mildly dilated.  - Pulmonary arteries: No complete TR doppler jet so unable to    estimate PA systolic pressure.  - Inferior vena cava: The vessel was normal in  size. The    respirophasic diameter changes were in the normal range (>= 50%),    consistent with normal central venous pressure.   Impressions:  - Normal LV size with severe LV hypertrophy. EF 60-65%. Moderate    LAE. Normal RV size and systolic function. No significant    valvular abnormalities. Cardiac amyloidosis is a consideration.    EKG:  EKG is personally reviewed. 06/06/2023:  Not ordered. 01/11/2021: Not ordered.  Recent Labs: 07/25/2023: ALT 21; B Natriuretic Peptide 240.9 07/26/2023: BUN 54; Creatinine, Ser 2.50; Hemoglobin 10.0; Magnesium 2.2; Platelets 264; Potassium 4.2; Sodium 139   Recent Lipid Panel    Component Value Date/Time   CHOL 202 (H) 09/21/2022 1303   TRIG 109 09/21/2022 1303   HDL 73 09/21/2022 1303   CHOLHDL 2.8 09/21/2022 1303   CHOLHDL 4.9 05/13/2013 0437   VLDL 20 05/13/2013 0437   LDLCALC 110 (H) 09/21/2022 1303    Physical Exam:    VS:  BP (!) 148/60   Pulse 65   Ht 5\' 8"  (1.727 m)   Wt 222 lb 11.2 oz (101 kg)   SpO2 99%   BMI 33.86 kg/m  , BMI Body mass index is 33.86 kg/m. GENERAL:  Well appearing; ambulating independently in wheelchair HEENT: Pupils equal round and reactive, fundi not visualized, oral mucosa unremarkable NECK:  No jugular venous distention, waveform within normal limits, carotid upstroke brisk and symmetric, no bruits, no thyromegaly LUNGS:  Clear to auscultation bilaterally HEART:  RRR.  PMI not displaced or sustained,S1 and S2 within normal limits, no S3, no S4, no clicks, no rubs, no murmurs ABD:  Flat, positive bowel sounds normal in frequency in pitch, no bruits, no rebound, no guarding, no midline pulsatile mass, no hepatomegaly, no splenomegaly EXT:  2 plus pulses throughout, + left LE edema, right LE in cast, no cyanosis no clubbing SKIN:  No rashes no nodules NEURO:  Cranial nerves II through XII grossly intact, motor grossly intact throughout PSYCH:  Cognitively intact, oriented to person place and  time  ASSESSMENT/PLAN:    # Ankle Injury Healing but with persistent swelling. Brace applied. No current plans for surgery. Follow-up with orthopedic surgeon in February. -Continue current management and monitor for changes.  # Hypertension Patient ran out of Hydralazine and Spironolactone. Recent blood pressure readings slightly elevated compared to baseline. -Refill Hydralazine and Spironolactone prescriptions at the pharmacy downstairs today.  # Fluid Retention # Acute on chronic HFpEF:  Recent increase in weight and ankle swelling, likely due to missed doses of Furosemide. -Double dose of Furosemide for two days to reduce fluid retention. -Continue amlodipine, carvedilol, Farxiga, hydralazine, Imdur and spironolactone.  # Hyperlipidemia Due for cholesterol check. Currently on 80mg  of cholesterol medication. -Order lipid panel and comprehensive metabolic panel today.  Follow-up in 3-4 months to ensure stability of conditions.      Screening for Secondary Hypertension:     01/11/2021    3:35 PM  Causes  Drugs/Herbals Screened  Sleep Apnea Screened     - Comments snores  Thyroid Disease Screened  Hyperaldosteronism Screened     - Comments 2018    Relevant Labs/Studies:    Latest Ref Rng & Units 07/26/2023    5:37 AM 07/25/2023    3:27 PM 05/29/2023    1:00 PM  Basic Labs  Sodium 135 - 145 mmol/L 139  141  142   Potassium 3.5 - 5.1 mmol/L 4.2  4.7  4.5   Creatinine 0.44 - 1.00 mg/dL 9.14  7.82  9.56        Latest Ref Rng & Units 01/11/2021    3:52 PM 02/27/2019   11:29 AM  Thyroid   TSH 0.450 - 4.500 uIU/mL 0.744  0.665        Latest Ref Rng & Units 01/11/2021    3:52 PM 02/27/2017   11:23 AM 02/27/2017   11:22 AM 02/27/2017   11:20 AM 02/27/2017   11:16 AM 02/27/2017   11:14 AM 02/27/2017   10:53 AM  Renin/Aldosterone   Aldosterone 0.0 - 30.0 ng/dL 21.3  08.6  57.8  46.9  <1.0  5,161.8  1.4   Renin 0.167 - 5.380 ng/mL/hr 1.455         Aldos/Renin Ratio 0.0 - 30.0  7.3                 Latest Ref Rng & Units 02/27/2017   11:23 AM 02/27/2017   11:22 AM 02/27/2017   11:20 AM 02/27/2017   11:16 AM 02/27/2017   11:14 AM 02/27/2017   10:53 AM 02/27/2017   10:40 AM  Cortisol  Cortisol  ug/dL 62.9  52.8  41.3  24.4  >100.0  5.0  23.6      Disposition:    FU with Advanced HTN clinic in 2 months.  Medication Adjustments/Labs and Tests Ordered: Current medicines are reviewed at length with the patient today.  Concerns regarding medicines are outlined above.   Orders Placed This Encounter  Procedures   Lipid panel   Comprehensive metabolic panel   Meds ordered this encounter  Medications   spironolactone (ALDACTONE) 25 MG tablet    Sig: Take 1 tablet (25 mg total) by mouth daily.    Dispense:  90 tablet    Refill:  1   hydrALAZINE (APRESOLINE) 50 MG tablet    Sig: Take 1 tablet (50 mg total) by mouth 3 (three) times daily.    Dispense:  270 tablet    Refill:  1    Signed, Chilton Si, MD  09/06/2023 1:59 PM    [facility]

## 2023-09-06 NOTE — Patient Instructions (Signed)
Medication Instructions:  TAKE LASIX (FUROSEMIDE) 80 MG FOR 2 DAYS THEN RESUME 40 MG   Labwork: LP/CMET TODY   Testing/Procedures: NONE  Follow-Up: 11/14/2023 1:45 WITH DR [facility]   If you need a refill on your cardiac medications before your next appointment, please call your pharmacy.

## 2023-09-07 LAB — COMPREHENSIVE METABOLIC PANEL
ALT: 19 [IU]/L (ref 0–32)
AST: 16 [IU]/L (ref 0–40)
Albumin: 4.6 g/dL (ref 3.9–4.9)
Alkaline Phosphatase: 103 [IU]/L (ref 44–121)
BUN/Creatinine Ratio: 18 (ref 12–28)
BUN: 46 mg/dL — ABNORMAL HIGH (ref 8–27)
Bilirubin Total: 0.8 mg/dL (ref 0.0–1.2)
CO2: 21 mmol/L (ref 20–29)
Calcium: 9.4 mg/dL (ref 8.7–10.3)
Chloride: 103 mmol/L (ref 96–106)
Creatinine, Ser: 2.61 mg/dL — ABNORMAL HIGH (ref 0.57–1.00)
Globulin, Total: 3.2 g/dL (ref 1.5–4.5)
Glucose: 85 mg/dL (ref 70–99)
Potassium: 5.3 mmol/L — ABNORMAL HIGH (ref 3.5–5.2)
Sodium: 141 mmol/L (ref 134–144)
Total Protein: 7.8 g/dL (ref 6.0–8.5)
eGFR: 20 mL/min/{1.73_m2} — ABNORMAL LOW (ref >59)

## 2023-09-07 LAB — LIPID PANEL
Chol/HDL Ratio: 2.9 {ratio} (ref 0.0–4.4)
Cholesterol, Total: 129 mg/dL (ref 100–199)
HDL: 45 mg/dL (ref >39)
LDL Chol Calc (NIH): 73 mg/dL (ref 0–99)
Triglycerides: 46 mg/dL (ref 0–149)
VLDL Cholesterol Cal: 11 mg/dL (ref 5–40)

## 2023-09-10 ENCOUNTER — Ambulatory Visit: Payer: Self-pay

## 2023-09-10 DIAGNOSIS — D631 Anemia in chronic kidney disease: Secondary | ICD-10-CM | POA: Diagnosis not present

## 2023-09-10 DIAGNOSIS — I509 Heart failure, unspecified: Secondary | ICD-10-CM | POA: Diagnosis not present

## 2023-09-10 DIAGNOSIS — I129 Hypertensive chronic kidney disease with stage 1 through stage 4 chronic kidney disease, or unspecified chronic kidney disease: Secondary | ICD-10-CM | POA: Diagnosis not present

## 2023-09-10 DIAGNOSIS — N184 Chronic kidney disease, stage 4 (severe): Secondary | ICD-10-CM | POA: Diagnosis not present

## 2023-09-10 DIAGNOSIS — N2581 Secondary hyperparathyroidism of renal origin: Secondary | ICD-10-CM | POA: Diagnosis not present

## 2023-09-11 NOTE — Patient Outreach (Signed)
Care Coordination   Follow Up Visit Note   09/10/2023 Name: [name] MRN: [id number] DOB: January 29, 1961  [name] is a 63 y.o. year old female who sees [name], FNP for primary care. I spoke with  [name] by phone today.  What matters to the patients health and wellness today?  Patient would like to restart her blood pressure medications as prescribed. She will work on filling her medications prior to running out.     Goals Addressed             This Visit's Progress    COMPLETED: CCM (HYPERTENSION) EXPECTED OUTCOME: MONITOR, SELF-MANAGE AND REDUCE SYMPTOMS OF HYPERTENSION   On track    Duplicate goal      To better manage CHF   On track    Care Coordination Interventions: Re-educated patient regarding basic overview and discussion of pathophysiology of Heart Failure reviewed Provided education while providing rationale regarding importance to adhere to a low sodium diet Reviewed Heart Failure Action Plan in depth and provided written copy Assessed need for readable accurate scales in home Provided education about placing scale on hard, flat surface Advised patient to weigh each morning after emptying bladder Discussed importance of daily weight and advised patient to weigh and record daily Reviewed role of diuretics in prevention of fluid overload and management of heart failure; Discussed the importance of keeping all appointments with provider Discussed plans with patient for ongoing care coordination follow up and provided patient with direct contact information for nurse care coordinator        To obtain consistent BP control   On track    Care Coordination Interventions:  Evaluation of current treatment plan related to hypertension self management and patient's adherence to plan as established by provider;   Reviewed medications with patient and discussed importance of compliance;   Counseled on the importance of exercise goals with target of  150 minutes per week Advised patient, providing education and rationale, to monitor blood pressure daily and record, calling PCP for findings outside established parameters;  Discussed complications of poorly controlled blood pressure such as heart disease, stroke, circulatory complications, vision complications, kidney impairment, sexual dysfunction;  Reinforced importance of taking medications as prescribed and timely refills Reviewed low sodium diet Discussed plans with patient for ongoing care coordination follow up and provided patient with direct contact information for nurse care coordinator Last practice recorded BP readings:  BP Readings from Last 3 Encounters:  09/06/23 (!) 148/60  08/13/23 128/78  08/01/23 120/64   Most recent eGFR/CrCl:  Lab Results  Component Value Date   EGFR 20 (L) 09/06/2023    No components found for: "CRCL"     Interventions Today    Flowsheet Row Most Recent Value  Chronic Disease   Chronic disease during today's visit Congestive Heart Failure (CHF), Hypertension (HTN)  General Interventions   General Interventions Discussed/Reviewed General Interventions Discussed, General Interventions Reviewed, Labs, Doctor Visits, Durable Medical Equipment (DME)  Doctor Visits Discussed/Reviewed PCP, Doctor Visits Reviewed, Doctor Visits Discussed, Specialist  Durable Medical Equipment (DME) BP Cuff  Education Interventions   Education Provided Provided Education, Provided Printed Education  Provided Verbal Education On Nutrition, When to see the doctor, Labs, Medication  Labs Reviewed Kidney Function  Nutrition Interventions   Nutrition Discussed/Reviewed Nutrition Discussed, Nutrition Reviewed, Fluid intake, Decreasing salt  Pharmacy Interventions   Pharmacy Dicussed/Reviewed Pharmacy Topics Discussed, Pharmacy Topics Reviewed, Medications and their functions          SDOH  assessments and interventions completed:  No     Care Coordination  Interventions:  Yes, provided   Follow up plan: Follow up call scheduled for 11/16/23 @10 :30 AM    Encounter Outcome:  Patient Visit Completed

## 2023-09-11 NOTE — Patient Instructions (Signed)
Visit Information  Thank you for taking time to visit with me today. Please don't hesitate to contact me if I can be of assistance to you.   Following are the goals we discussed today:   Goals Addressed             This Visit's Progress    COMPLETED: CCM (HYPERTENSION) EXPECTED OUTCOME: MONITOR, SELF-MANAGE AND REDUCE SYMPTOMS OF HYPERTENSION   On track    Duplicate goal      To better manage CHF   On track    Care Coordination Interventions: Re-educated patient regarding basic overview and discussion of pathophysiology of Heart Failure reviewed Provided education while providing rationale regarding importance to adhere to a low sodium diet Reviewed Heart Failure Action Plan in depth and provided written copy Assessed need for readable accurate scales in home Provided education about placing scale on hard, flat surface Advised patient to weigh each morning after emptying bladder Discussed importance of daily weight and advised patient to weigh and record daily Reviewed role of diuretics in prevention of fluid overload and management of heart failure; Discussed the importance of keeping all appointments with provider Discussed plans with patient for ongoing care coordination follow up and provided patient with direct contact information for nurse care coordinator        To obtain consistent BP control   On track    Care Coordination Interventions:  Evaluation of current treatment plan related to hypertension self management and patient's adherence to plan as established by provider;   Reviewed medications with patient and discussed importance of compliance;   Counseled on the importance of exercise goals with target of 150 minutes per week Advised patient, providing education and rationale, to monitor blood pressure daily and record, calling PCP for findings outside established parameters;  Discussed complications of poorly controlled blood pressure such as heart disease, stroke,  circulatory complications, vision complications, kidney impairment, sexual dysfunction;  Reinforced importance of taking medications as prescribed and timely refills Reviewed low sodium diet Discussed plans with patient for ongoing care coordination follow up and provided patient with direct contact information for nurse care coordinator Last practice recorded BP readings:  BP Readings from Last 3 Encounters:  09/06/23 (!) 148/60  08/13/23 128/78  08/01/23 120/64   Most recent eGFR/CrCl:  Lab Results  Component Value Date   EGFR 20 (L) 09/06/2023    No components found for: "CRCL"         Our next appointment is by telephone on 11/16/23 at 10:30 AM  Please call the care guide team at [phone] if you need to cancel or reschedule your appointment.   If you are experiencing a Mental Health or Behavioral Health Crisis or need someone to talk to, please call 1-800-273-TALK (toll free, 24 hour hotline)  The patient verbalized understanding of instructions, educational materials, and care plan provided today and DECLINED offer to receive copy of patient instructions, educational materials, and care plan.   Delsa Sale RN BSN CCM [place]  [facility], [facility] Nurse Care Coordinator  Direct Dial: [phone] Website: [email or web address]  '

## 2023-09-12 DIAGNOSIS — N184 Chronic kidney disease, stage 4 (severe): Secondary | ICD-10-CM | POA: Diagnosis not present

## 2023-09-12 DIAGNOSIS — D631 Anemia in chronic kidney disease: Secondary | ICD-10-CM | POA: Diagnosis not present

## 2023-09-12 DIAGNOSIS — S82891D Other fracture of right lower leg, subsequent encounter for closed fracture with routine healing: Secondary | ICD-10-CM | POA: Diagnosis not present

## 2023-09-12 DIAGNOSIS — I872 Venous insufficiency (chronic) (peripheral): Secondary | ICD-10-CM | POA: Diagnosis not present

## 2023-09-12 DIAGNOSIS — I5033 Acute on chronic diastolic (congestive) heart failure: Secondary | ICD-10-CM | POA: Diagnosis not present

## 2023-09-12 DIAGNOSIS — I130 Hypertensive heart and chronic kidney disease with heart failure and stage 1 through stage 4 chronic kidney disease, or unspecified chronic kidney disease: Secondary | ICD-10-CM | POA: Diagnosis not present

## 2023-09-27 DIAGNOSIS — I129 Hypertensive chronic kidney disease with stage 1 through stage 4 chronic kidney disease, or unspecified chronic kidney disease: Secondary | ICD-10-CM | POA: Diagnosis not present

## 2023-09-27 DIAGNOSIS — I509 Heart failure, unspecified: Secondary | ICD-10-CM | POA: Diagnosis not present

## 2023-09-27 DIAGNOSIS — N2581 Secondary hyperparathyroidism of renal origin: Secondary | ICD-10-CM | POA: Diagnosis not present

## 2023-09-27 DIAGNOSIS — K219 Gastro-esophageal reflux disease without esophagitis: Secondary | ICD-10-CM | POA: Diagnosis not present

## 2023-09-27 DIAGNOSIS — N184 Chronic kidney disease, stage 4 (severe): Secondary | ICD-10-CM | POA: Diagnosis not present

## 2023-09-27 DIAGNOSIS — D631 Anemia in chronic kidney disease: Secondary | ICD-10-CM | POA: Diagnosis not present

## 2023-09-27 DIAGNOSIS — E269 Hyperaldosteronism, unspecified: Secondary | ICD-10-CM | POA: Diagnosis not present

## 2023-09-27 DIAGNOSIS — N189 Chronic kidney disease, unspecified: Secondary | ICD-10-CM | POA: Diagnosis not present

## 2023-09-27 DIAGNOSIS — E559 Vitamin D deficiency, unspecified: Secondary | ICD-10-CM | POA: Diagnosis not present

## 2023-09-28 LAB — LAB REPORT - SCANNED: EGFR: 13

## 2023-10-08 ENCOUNTER — Other Ambulatory Visit: Payer: Self-pay

## 2023-10-09 ENCOUNTER — Other Ambulatory Visit (HOSPITAL_BASED_OUTPATIENT_CLINIC_OR_DEPARTMENT_OTHER): Payer: Self-pay

## 2023-10-09 ENCOUNTER — Other Ambulatory Visit: Payer: Self-pay | Admitting: Pharmacist

## 2023-10-09 MED ORDER — HYDRALAZINE HCL 50 MG PO TABS: 50.0000 mg | ORAL_TABLET | Freq: Three times a day (TID) | ORAL | 1 refills | Status: AC

## 2023-10-09 NOTE — Progress Notes (Signed)
10/09/2023 Name: [name] MRN: [id number] DOB: Dec 27, 1960  Chief Complaint  Patient presents with   Medication Management    Hypertension & Hyperlipidemia    [name] is a 63 y.o. year old female who presented for a telephone visit.   They were referred to the pharmacist by their PCP for assistance in managing hypertension and hyperlipidemia.    Subjective:  Care Team: Primary Care Provider: [name], FNP ; Next Scheduled Visit: 10/30/2023 Cardiologist: Dr. Duke Salvia ; Next Scheduled Visit: 11/12/2023  Medication Access/Adherence  Current Pharmacy:  [facility] #[id number] [facility], [place] - [address] AT [facility] [address] [place] [address] Phone: [phone] Fax: [phone]   Patient reports affordability concerns with their medications: No  Patient reports access/transportation concerns to their pharmacy: Yes  Patient reports adherence concerns with their medications:  Yes  Hydralazine   Hypertension:  Current medications:   Carvedilol 12.5 1 Tablet twice daily Hydralazine 50 mg 1 tablet three times daily   Patient has a validated, automated, upper arm home BP cuff Current blood pressure readings readings: 161/92  Patient denies hypotensive/hypertensiv /hs/sx including  dizziness, lightheadedness. headache, chest pain, shortness of breath      Heart Failure (EF 60-65%):  Current medications:  ACEi/ARB/ARNI: none due to renal function SGLT2i: Farxiga 10 mg daily Beta blocker: carvedilol 12.5 mg twice daily Mineralocorticoid Receptor Antagonist: spironolactone 25 mg daily  Diuretic regimen: furosemide 40 mg daily  Additional antihypertensives: hydralazine 50 mg three times daily, amlodipine 10 mg daily   Patient not currently taking Hydralazine.  There was some confusion on her part.  Dr. Duke Salvia sent in a new prescription on 09/06/23 to the [facility] at [facility].  The pharmacy reported the script is just  "on hold" as it was never picked up.  Patient said she did not have a ride to that pharmacy but could get to her neighborhood pharmacy--[facility] today.   Requested refill request to be sent to the local Pharmacy by [name], FNP and she approved.   Patient denies volume overload signs or symptoms including  shortness of breath, lower extremity edema, increased use of pillows at night    Lab Results  Component Value Date   HGBA1C 4.9 08/23/2021    Lab Results  Component Value Date   CREATININE 2.61 (H) 09/06/2023   BUN 46 (H) 09/06/2023   NA 141 09/06/2023   K 5.3 (H) 09/06/2023   CL 103 09/06/2023   CO2 21 09/06/2023    Lab Results  Component Value Date   CHOL 129 09/06/2023   HDL 45 09/06/2023   LDLCALC 73 09/06/2023   TRIG 46 09/06/2023   CHOLHDL 2.9 09/06/2023    Medications Reviewed Today     Reviewed by [name], RPH (Pharmacist) on 10/09/23 at 1507  Med List Status: <None>   Medication Order Taking? Sig Documenting Provider Last Dose Status Informant  acetaminophen (TYLENOL) 325 MG tablet [id number] Yes Take 650 mg by mouth every 6 (six) hours as needed for mild pain or headache. [provider] Taking Active Self  amLODipine (NORVASC) 10 MG tablet [id number] Yes Take 1 tablet (10 mg total) by mouth daily. Ellender Hose, NP Taking Active Self           Med Note Lenoria Farrier   Tue Apr 10, 2023  2:30 PM)    aspirin EC 81 MG tablet [id number] Yes Take 1 tablet (81 mg total) by mouth 2 (two) times daily.  For blood clot prevention. Take for 30 days after surgery. Swallow whole.  Patient taking differently: Take 81 mg by mouth daily. For blood clot prevention. Take for 30 days after surgery. Swallow whole.   [place], Jesse J, PA-C Taking Active Self  atorvastatin (LIPITOR) 80 MG tablet [id number] Yes Take 1 tablet (80 mg total) by mouth daily. Chilton Si, MD Taking Active Self  carvedilol (COREG) 12.5 MG tablet [id number] Yes Take 1 tablet (12.5  mg total) by mouth 2 (two) times daily with a meal. Chilton Si, MD Taking Active Self  FARXIGA 10 MG TABS tablet [id number] Yes Take 1 tablet (10 mg total) by mouth daily. Ellender Hose, NP Taking Active Self           Med Note Raeanne Gathers Aug 13, 2023  3:14 PM)    ferrous gluconate (FERGON) 324 MG tablet [id number] Yes Take 1 tablet (324 mg total) by mouth daily with breakfast. [name], FNP Taking Active   furosemide (LASIX) 40 MG tablet [id number] Yes Take 1 tablet (40 mg total) by mouth daily. [name], FNP Taking Active   hydrALAZINE (APRESOLINE) 50 MG tablet [id number] No Take 1 tablet (50 mg total) by mouth 3 (three) times daily.  Patient not taking: Reported on 10/09/2023   Chilton Si, MD Not Taking Active   melatonin 3 MG TABS tablet [id number] No Take 1 tablet (3 mg total) by mouth at bedtime as needed.  Patient not taking: Reported on 10/09/2023   [name], FNP Not Taking Active Self  pantoprazole (PROTONIX) 40 MG tablet [id number] Yes Take 40 mg by mouth 2 (two) times daily. [provider]  Active   polyethylene glycol (MIRALAX / GLYCOLAX) 17 g packet [id number] Yes Take 17 g by mouth daily as needed for mild constipation or moderate constipation. [provider] Taking Active Self  spironolactone (ALDACTONE) 25 MG tablet [id number] Yes Take 1 tablet (25 mg total) by mouth daily. Chilton Si, MD Taking Active              Assessment/Plan:   Hypertension: - Currently uncontrolled - Reviewed appropriate blood pressure monitoring technique and reviewed goal blood pressure. - -Recommend to pick up refill at [facility]   Follow Up Plan:    Follow up with patient in 1 week to check on blood pressure.  [name], PharmD, [facility] Clinical Pharmacist [facility] [phone]

## 2023-10-16 ENCOUNTER — Telehealth: Payer: Self-pay | Admitting: Pharmacist

## 2023-10-16 DIAGNOSIS — I152 Hypertension secondary to endocrine disorders: Secondary | ICD-10-CM

## 2023-10-16 NOTE — Progress Notes (Signed)
   10/16/2023 Name: Edit Ricciardelli MRN: [id number] DOB: [birth year]  Chief Complaint  Patient presents with   Medication Management    Hypertension     [name] is a 63 y.o. year old female who presented for a telephone visit.   They were referred to the pharmacist by their PCP for assistance in managing hypertension.    Subjective:  Care Team: Primary Care Provider: [name], FNP ; Next Scheduled Visit: 10/30/2023   Medication Access/Adherence  Current Pharmacy:  [facility] #[id number] [facility], [place] - [address] AT [facility] [address] [place] [address] Phone: [phone] Fax: [phone]   Patient reports affordability concerns with their medications: No  Patient reports access/transportation concerns to their pharmacy: No  Patient reports adherence concerns with their medications:  No  Patient reported picking up Hydralazine at her local pharmacy and that she is taking it as prescribed.     Hypertension:  Current medications:   Hypertension:   Current medications:   Carvedilol 12.5 1 Tablet twice daily Hydralazine 50 mg 1 tablet three times daily     Patient has a validated, automated, upper arm home BP cuff (she was not at home at the time of my call to tell me what her blood pressure was this morning) She said it was 147/70 at therapy yesterday and that she was able to have therapy.  (Last week they would not complete her therapy due to her blood pressure)   Patient denies hypotensive/hypertensiv /hs/sx including  dizziness, lightheadedness. headache, chest pain, shortness of breath   Objective:  Lab Results  Component Value Date   HGBA1C 4.9 08/23/2021    Lab Results  Component Value Date   CREATININE 2.61 (H) 09/06/2023   BUN 46 (H) 09/06/2023   NA 141 09/06/2023   K 5.3 (H) 09/06/2023   CL 103 09/06/2023   CO2 21 09/06/2023    Lab Results  Component Value Date   CHOL 129 09/06/2023   HDL 45 09/06/2023    LDLCALC 73 09/06/2023   TRIG 46 09/06/2023   CHOLHDL 2.9 09/06/2023    Medications Reviewed Today   Medications were not reviewed in this encounter       Assessment/Plan:   Hypertension: - Currently uncontrolled - Continue current therapy.   Follow Up Plan:    Follow up after her PCP appt.  [name], PharmD, BCACP Clinical Pharmacist [phone]

## 2023-10-30 ENCOUNTER — Encounter: Payer: 59 | Admitting: Nurse Practitioner

## 2023-11-05 DIAGNOSIS — I129 Hypertensive chronic kidney disease with stage 1 through stage 4 chronic kidney disease, or unspecified chronic kidney disease: Secondary | ICD-10-CM | POA: Diagnosis not present

## 2023-11-05 DIAGNOSIS — N2581 Secondary hyperparathyroidism of renal origin: Secondary | ICD-10-CM | POA: Diagnosis not present

## 2023-11-05 DIAGNOSIS — N189 Chronic kidney disease, unspecified: Secondary | ICD-10-CM | POA: Diagnosis not present

## 2023-11-05 DIAGNOSIS — R809 Proteinuria, unspecified: Secondary | ICD-10-CM | POA: Diagnosis not present

## 2023-11-05 DIAGNOSIS — D631 Anemia in chronic kidney disease: Secondary | ICD-10-CM | POA: Diagnosis not present

## 2023-11-05 DIAGNOSIS — I509 Heart failure, unspecified: Secondary | ICD-10-CM | POA: Diagnosis not present

## 2023-11-05 DIAGNOSIS — E269 Hyperaldosteronism, unspecified: Secondary | ICD-10-CM | POA: Diagnosis not present

## 2023-11-05 DIAGNOSIS — N184 Chronic kidney disease, stage 4 (severe): Secondary | ICD-10-CM | POA: Diagnosis not present

## 2023-11-05 LAB — LAB REPORT - SCANNED: EGFR: 18

## 2023-11-13 NOTE — Progress Notes (Signed)
 Hi Dr. Duke Salvia and Orpha, [name] has been identified as a patient who may benefit from health coaching for smoking cessation. If patient is interested please send referral to [id number] or search for Care Navigation.   Renaee Munda, MS, ERHD, [facility]  Care Guide [facility] Care Navigation Telephone: [phone] Email: [email or web address]

## 2023-11-14 ENCOUNTER — Telehealth: Payer: Self-pay | Admitting: Pharmacist

## 2023-11-14 ENCOUNTER — Encounter (HOSPITAL_BASED_OUTPATIENT_CLINIC_OR_DEPARTMENT_OTHER): Payer: Self-pay | Admitting: Cardiovascular Disease

## 2023-11-14 ENCOUNTER — Ambulatory Visit (HOSPITAL_BASED_OUTPATIENT_CLINIC_OR_DEPARTMENT_OTHER): Payer: 59 | Admitting: Cardiovascular Disease

## 2023-11-14 VITALS — BP 130/62 | HR 78 | Ht 68.0 in | Wt 227.2 lb

## 2023-11-14 DIAGNOSIS — D3502 Benign neoplasm of left adrenal gland: Secondary | ICD-10-CM | POA: Diagnosis not present

## 2023-11-14 DIAGNOSIS — I5032 Chronic diastolic (congestive) heart failure: Secondary | ICD-10-CM | POA: Diagnosis not present

## 2023-11-14 DIAGNOSIS — I152 Hypertension secondary to endocrine disorders: Secondary | ICD-10-CM

## 2023-11-14 DIAGNOSIS — E7800 Pure hypercholesterolemia, unspecified: Secondary | ICD-10-CM | POA: Diagnosis not present

## 2023-11-14 DIAGNOSIS — I10 Essential (primary) hypertension: Secondary | ICD-10-CM

## 2023-11-14 DIAGNOSIS — I517 Cardiomegaly: Secondary | ICD-10-CM | POA: Diagnosis not present

## 2023-11-14 DIAGNOSIS — E269 Hyperaldosteronism, unspecified: Secondary | ICD-10-CM

## 2023-11-14 DIAGNOSIS — F1410 Cocaine abuse, uncomplicated: Secondary | ICD-10-CM

## 2023-11-14 DIAGNOSIS — N184 Chronic kidney disease, stage 4 (severe): Secondary | ICD-10-CM

## 2023-11-14 NOTE — Progress Notes (Signed)
 11/14/2023 Name: [name] MRN: [id number] DOB: 1960/11/01  Chief Complaint  Patient presents with   Medication Management    Hypertension     [name] is a 63 y.o. year old female who presented for a telephone visit.   They were referred to the pharmacist by their PCP for assistance in managing hypertension.    Subjective:  Care Team: Primary Care Provider: [name], FNP ; Next Scheduled Visit: 12/17/23 Cardiologist: Dr, Duke Salvia; Next Scheduled Visit: 11/14/2023  Medication Access/Adherence  Current Pharmacy:  [facility] #[id number] [facility], [place] - [address] AT [facility] [address] [place] [address] Phone: [phone] Fax: [phone]   Patient reports affordability concerns with their medications: No  Patient reports access/transportation concerns to their pharmacy: No  Patient reports adherence concerns with their medications:  No       Hypertension:  Current medications:    Amlodipine 10mg  Daily Carvedilol 12.5mg  1 tablet twice daily Furosemide 40 mg 1 tablet daily Hydralazine 50mg  1 tablet three times daily Spironolactone 25mg  1 tablet daily  Patient has a validated, automated, upper arm home BP cuff Current blood pressure readings readings: 168/92 This morning.  (Had just taken her medications at the time of our call)  Patient denies hypotensive s/sx including  dizziness, lightheadedness.  Patient denies hypertensive symptoms including  headache, chest pain, shortness of breath   Patient reported taking all of her medications as prescribed.  She had been off of Hydralazine but this was called in for her in February and she said she picked it up at her local pharmacy on 10/11/23.   She has an appointment to see Cardiology today.   Objective:  Lab Results  Component Value Date   HGBA1C 4.9 08/23/2021    Lab Results  Component Value Date   CREATININE 2.61 (H) 09/06/2023   BUN 46 (H) 09/06/2023   NA  141 09/06/2023   K 5.3 (H) 09/06/2023   CL 103 09/06/2023   CO2 21 09/06/2023    Lab Results  Component Value Date   CHOL 129 09/06/2023   HDL 45 09/06/2023   LDLCALC 73 09/06/2023   TRIG 46 09/06/2023   CHOLHDL 2.9 09/06/2023    Medications Reviewed Today     Reviewed by [name], RPH (Pharmacist) on 11/14/23 at 1009  Med List Status: <None>   Medication Order Taking? Sig Documenting Provider Last Dose Status Informant  acetaminophen (TYLENOL) 325 MG tablet [id number] Yes Take 650 mg by mouth every 6 (six) hours as needed for mild pain or headache. [provider] Taking Active Self  amLODipine (NORVASC) 10 MG tablet [id number] Yes Take 1 tablet (10 mg total) by mouth daily. Ellender Hose, NP Taking Active Self           Med Note Lenoria Farrier   Tue Apr 10, 2023  2:30 PM)    aspirin EC 81 MG tablet [id number] Yes Take 1 tablet (81 mg total) by mouth 2 (two) times daily. For blood clot prevention. Take for 30 days after surgery. Swallow whole.  Patient taking differently: Take 81 mg by mouth daily. For blood clot prevention. Take for 30 days after surgery. Swallow whole.   [place], Jesse J, PA-C Taking Active Self  atorvastatin (LIPITOR) 80 MG tablet [id number] Yes Take 1 tablet (80 mg total) by mouth daily. Chilton Si, MD Taking Active Self  carvedilol (COREG) 12.5 MG tablet [id number] Yes Take 1 tablet (12.5 mg total) by mouth 2 (two)  times daily with a meal. Chilton Si, MD Taking Active Self  FARXIGA 10 MG TABS tablet [id number] No Take 1 tablet (10 mg total) by mouth daily.  Patient not taking: Reported on 11/14/2023   Ellender Hose, NP Not Taking Active Self           Med Note Grayce Sessions Nov 14, 2023  9:53 AM) Held my Nephrologist  ferrous gluconate (FERGON) 324 MG tablet [id number] Yes Take 1 tablet (324 mg total) by mouth daily with breakfast. [name], FNP Taking Active   furosemide (LASIX) 40 MG tablet [id number] Yes Take 1 tablet (40  mg total) by mouth daily. [name], FNP Taking Active   hydrALAZINE (APRESOLINE) 50 MG tablet [id number] Yes Take 1 tablet (50 mg total) by mouth 3 (three) times daily. [name], FNP Taking Active   pantoprazole (PROTONIX) 40 MG tablet [id number] Yes Take 40 mg by mouth daily. [provider] Taking Active   polyethylene glycol (MIRALAX / GLYCOLAX) 17 g packet [id number] Yes Take 17 g by mouth daily as needed for mild constipation or moderate constipation. [provider] Taking Active Self  spironolactone (ALDACTONE) 25 MG tablet [id number] Yes Take 1 tablet (25 mg total) by mouth daily. Chilton Si, MD Taking Active               Assessment/Plan:   Hypertension: - Currently uncontrolled patient reported home blood pressure:  168/92 - Recommend to keep her appointment today with cardiology.  -Instructed patient to take her medication bottles with her to her appointment today. -depending on her pulse, there may be some room to increase the Carvedilol dose from 12.5 mg twice daily to 25 mg twice daily   Follow Up Plan:    Route note to Dr. Duke Salvia Follow up with Patient in 1 week.   [name], PharmD, BCACP Clinical Pharmacist [phone]

## 2023-11-14 NOTE — Progress Notes (Signed)
 Advanced Hypertension Clinic Follow-up:    Date:  11/24/2023   ID:  [name], DOB 07/01/1961, MRN [id number]  PCP:  [name], FNP  Cardiologist:  Chilton Si, MD  Nephrologist: Dr. Thedore Mins, [facility]  Referring MD: [name], FNP   CC: Hypertension  History of Present Illness:    [name] is a 63 y.o. female with a hx of anemia, chronic diastolic heart failure, CKD 4, chronic bronchitis, GERD, hypertension, hypokalemia, venous stasis ulcers, and tobacco use, here for follow up.  She first established care in the hypertension clinic 01/11/2021.  She was seen in the ED 11/2020 with a blood pressure of 213/88. She had been drinking heavily and was experiencing palpitations. She previously had an echo 08/2018 with LVEF 60-65% and grade 1 diastolic dysfunction. She had severe LVH. When she last saw her PCP 10/2020, her blood pressure was uncontrolled and she was referred to cardiology.  She reported fatigue, and believed this is due to Norvasc. She has had hypertension since she was in her 19's. In 2018 she was noted to have an adrenal adenoma.  Labs at that time were consistent with hyperaldosteronism.  She was referred to Dr. Othella Boyer at [facility].  She was seen at [facility] and underwent adrenal vein sampling 02/2021 which was consistent with hypersecretion of aldosterone on the right. She confirms that she never proceeded with laparascopic R adrenalectomy.  At her visit 05/2023 she was recently hospitalized for 6 days with pericardial effusion, diverticulitis.  At follow up she was slowing improving.  Carvedilol and atorvastatin were increased.  She remained unsure about removing her adrenal adenoma.  She was admitted 07/2023 with acute on chronic HFpEF treated with IV lasix.  She was seen 08/2023 and noted persistent LE edema.  She stopped taking hydralazine and spironolactone due to intolerance.  She had been out of furosemide and it was increased temporarily due to  edema.  Discussed the use of AI scribe software for clinical note transcription with the patient, who gave verbal consent to proceed.  History of Present Illness [name] is a 63 year old female who presents with sinus congestion and allergy symptoms.  She has been experiencing sinus congestion, sneezing, and runny eyes since yesterday, particularly at night. She attributes these symptoms to potential allergies due to blooming flowers and has not been taking any allergy medications. The sinus congestion worsens when lying down, causing difficulty sleeping. No issues with breathing during exercise, and she attributes her breathing difficulties to nasal congestion.  Her blood pressure was 160/90 at home after taking her medication, but it was 130/62 during the visit. She uses a home monitoring system that alerts her healthcare provider if her blood pressure is high.   Ms. Swickard notes that her swelling has been well-managed with compression socks.  She denies CP, orthopnea or PND.  A recent visit to her nephrologist she notes that Marcelline Deist was discontinued due to worsening eGFR.  She was advised to drink plenty of water. She has been signed up for dialysis classes, although she is not currently undergoing dialysis.  She has been in therapy, which was temporarily stopped but is scheduled to resume. She wants to return to work, stating 'I'm having a hard time. I really am.'   Previous antihypertensives: Lisinopril  Past Medical History:  Diagnosis Date   (HFpEF) heart failure with preserved ejection fraction (HCC) 05/15/2023   Adrenal adenoma 01/11/2021   Anemia    CHF (congestive heart failure) (HCC)  Chronic bronchitis (HCC)    "probably once/yr" (05/12/2013)   Chronic kidney disease    Diastolic heart failure    Dyslipidemia    pt denies this hx on 05/12/2013   Exertional shortness of breath    GERD (gastroesophageal reflux disease)    Heart murmur    Hypertension     Hypokalemia    Left ventricular hypertrophy 01/11/2021   Venous stasis ulcers (HCC)     Past Surgical History:  Procedure Laterality Date   BIOPSY  02/23/2023   Procedure: BIOPSY;  Surgeon: Lemar Lofty., MD;  Location: [facility] ENDOSCOPY;  Service: Gastroenterology;;   COLONOSCOPY N/A 02/23/2023   Procedure: COLONOSCOPY;  Surgeon: Lemar Lofty., MD;  Location[facility] ENDOSCOPY;  Service: Gastroenterology;  Laterality: N/A;   ESOPHAGOGASTRODUODENOSCOPY N/A 02/23/2023   Procedure: ESOPHAGOGASTRODUODENOSCOPY (EGD);  Surgeon: Lemar Lofty., MD;  Location: [facility] ENDOSCOPY;  Service: Gastroenterology;  Laterality: N/A;   HARDWARE REMOVAL Right 05/08/2023   Procedure: HARDWARE REMOVAL MEDIAL AND LATERAL;  Surgeon: Terance Hart, MD;  Location: [facility] ORS;  Service: Orthopedics;  Laterality: Right;   HOT HEMOSTASIS N/A 02/23/2023   Procedure: HOT HEMOSTASIS (ARGON PLASMA COAGULATION/BICAP);  Surgeon: Lemar Lofty., MD;  Location: [facility] ENDOSCOPY;  Service: Gastroenterology;  Laterality: N/A;   IR US GUIDE VASC ACCESS RIGHT  02/27/2017   IR VENOGRAM ADRENAL BI  02/27/2017   IR VENOGRAM HEPATIC WO HEMODYNAMIC EVALUATION  02/27/2017   IR VENOGRAM RENAL BI  02/27/2017   IR VENOUS SAMPLING  02/27/2017   IR VENOUS SAMPLING  02/27/2017   ORIF ANKLE FRACTURE Right 04/18/2023   Procedure: OPEN REDUCTION INTERNAL FIXATION (ORIF) ANKLE FRACTURE;  Surgeon: Joen Laura, MD;  Location: [facility] ORS;  Service: Orthopedics;  Laterality: Right;   ORIF ANKLE FRACTURE Right 05/08/2023   Procedure: REVISION OPEN REDUCTION INTERNAL FIXATION (ORIF) OF RIGHT TRIMALLEOLAR ANKLE FRACTURE, OPEN TREATMENT OF SYNDESMOSIS;  Surgeon: Terance Hart, MD;  Location: [facility] ORS;  Service: Orthopedics;  Laterality: Right;   POLYPECTOMY  02/23/2023   Procedure: POLYPECTOMY;  Surgeon: Meridee Score Netty Starring., MD;  Location: [facility] ENDOSCOPY;  Service: Gastroenterology;;   SYNDESMOSIS REPAIR Right 05/08/2023   Procedure:  SYNDESMOSIS REPAIR;  Surgeon: Terance Hart, MD;  Location: [facility] ORS;  Service: Orthopedics;  Laterality: Right;   VAGINAL HYSTERECTOMY  1990's    Current Medications: Current Meds  Medication Sig   acetaminophen (TYLENOL) 325 MG tablet Take 650 mg by mouth every 6 (six) hours as needed for mild pain or headache.   amLODipine (NORVASC) 10 MG tablet Take 1 tablet (10 mg total) by mouth daily.   aspirin EC 81 MG tablet Take 1 tablet (81 mg total) by mouth 2 (two) times daily. For blood clot prevention. Take for 30 days after surgery. Swallow whole. (Patient taking differently: Take 81 mg by mouth daily. For blood clot prevention. Take for 30 days after surgery. Swallow whole.)   atorvastatin (LIPITOR) 80 MG tablet Take 1 tablet (80 mg total) by mouth daily.   carvedilol (COREG) 12.5 MG tablet Take 1 tablet (12.5 mg total) by mouth 2 (two) times daily with a meal.   cetirizine (ZYRTEC) 10 MG tablet Take 10 mg by mouth daily as needed for allergies.   fluticasone (FLONASE) 50 MCG/ACT nasal spray Place 1 spray into both nostrils daily. EACH NOSTRIL AS NEEDED   furosemide (LASIX) 40 MG tablet Take 1 tablet (40 mg total) by mouth daily.   hydrALAZINE (APRESOLINE) 50 MG tablet Take 1 tablet (50 mg  total) by mouth 3 (three) times daily.   pantoprazole (PROTONIX) 40 MG tablet Take 40 mg by mouth daily.   polyethylene glycol (MIRALAX / GLYCOLAX) 17 g packet Take 17 g by mouth daily as needed for mild constipation or moderate constipation.   spironolactone (ALDACTONE) 25 MG tablet Take 1 tablet (25 mg total) by mouth daily.   [DISCONTINUED] ferrous gluconate (FERGON) 324 MG tablet Take 1 tablet (324 mg total) by mouth daily with breakfast.     Allergies:   Lisinopril   Social History   Socioeconomic History   Marital status: Single    Spouse name: Not on file   Number of children: Not on file   Years of education: Not on file   Highest education level: Not on file  Occupational History    Occupation: disability  Tobacco Use   Smoking status: Every Day    Current packs/day: 0.50    Average packs/day: 0.5 packs/day for 10.0 years (5.0 ttl pk-yrs)    Types: Cigarettes   Smokeless tobacco: Never   Tobacco comments:    trying to cut back, 3/29 - only had one cigarette today, average 4 a day    07/25/23 cut back to 1 a day  Vaping Use   Vaping status: Never Used  Substance and Sexual Activity   Alcohol use: Not Currently   Drug use: Not Currently    Types: Cocaine    Comment: 05/12/2013 "tried it a few days ago; didn't like it"   Sexual activity: Not Currently  Other Topics Concern   Not on file  Social History Narrative   Not on file   Social Drivers of Health   Financial Resource Strain: High Risk (11/06/2022)   Overall Financial Resource Strain (CARDIA)    Difficulty of Paying Living Expenses: Very hard  Food Insecurity: No Food Insecurity (08/27/2023)   Hunger Vital Sign    Worried About Running Out of Food in the Last Year: Never true    Ran Out of Food in the Last Year: Never true  Transportation Needs: No Transportation Needs (08/27/2023)   PRAPARE - Administrator, Civil Service (Medical): No    Lack of Transportation (Non-Medical): No  Physical Activity: Insufficiently Active (11/06/2022)   Exercise Vital Sign    Days of Exercise per Week: 5 days    Minutes of Exercise per Session: 20 min  Stress: No Stress Concern Present (11/06/2022)   [employer] of Occupational Health - Occupational Stress Questionnaire    Feeling of Stress : Only a little  Social Connections: Moderately Isolated (11/06/2022)   Social Connection and Isolation Panel [NHANES]    Frequency of Communication with Friends and Family: Twice a week    Frequency of Social Gatherings with Friends and Family: Once a week    Attends Religious Services: More than 4 times per year    Active Member of [employer] or Organizations: No    Attends Banker Meetings: Never    Marital  Status: Never married     Family History: The patient's family history includes Diabetes in her sister and sister; Heart attack in her father; Hypertension in her brother, mother, and sister; Kidney failure in her mother.  ROS:   Please see the history of present illness.    (+) Prior fall 03/2023 with right ankle fracture (+) Generalized soreness/myalgias All other systems reviewed and are negative.  EKGs/Labs/Other Studies Reviewed:    Echo  05/16/2023:  1. Left ventricular ejection fraction,  by estimation, is 60 to 65%. The  left ventricle has normal function. The left ventricle has no regional  wall motion abnormalities. There is severe concentric left ventricular  hypertrophy. Left ventricular diastolic   parameters are consistent with Grade I diastolic dysfunction (impaired  relaxation). Elevated left ventricular end-diastolic pressure.   2. Right ventricular systolic function is normal. The right ventricular  size is normal. Tricuspid regurgitation signal is inadequate for assessing  PA pressure.   3. Left atrial size was severely dilated.   4. Right atrial size was mildly dilated.   5. The mitral valve is normal in structure. No evidence of mitral valve  regurgitation. No evidence of mitral stenosis.   6. The aortic valve is tricuspid. Aortic valve regurgitation is mild.  Aortic valve sclerosis/calcification is present, without any evidence of  aortic stenosis.   7. The inferior vena cava is normal in size with greater than 50%  respiratory variability, suggesting right atrial pressure of 3 mmHg.   US Arterial Seg Multiple LE 11/05/2020: Normal exam. No evidence of hemodynamically significant peripheral arterial disease.  Echo TTE 09/06/2018: - Left ventricle: The cavity size was normal. Wall thickness was    increased in a pattern of severe LVH. Systolic function was    normal. The estimated ejection fraction was in the range of 60%    to 65%. Wall motion was normal;  there were no regional wall    motion abnormalities. Doppler parameters are consistent with    abnormal left ventricular relaxation (grade 1 diastolic    dysfunction).  - Aortic valve: There was no stenosis. There was trivial    regurgitation.  - Mitral valve: There was trivial regurgitation.  - Left atrium: The atrium was moderately dilated.  - Right ventricle: The cavity size was normal. Systolic function    was normal.  - Right atrium: The atrium was mildly dilated.  - Pulmonary arteries: No complete TR doppler jet so unable to    estimate PA systolic pressure.  - Inferior vena cava: The vessel was normal in size. The    respirophasic diameter changes were in the normal range (>= 50%),    consistent with normal central venous pressure.   Impressions:  - Normal LV size with severe LV hypertrophy. EF 60-65%. Moderate    LAE. Normal RV size and systolic function. No significant    valvular abnormalities. Cardiac amyloidosis is a consideration.    EKG:  EKG is personally reviewed. 06/06/2023:  Not ordered. 01/11/2021: Not ordered.  Recent Labs: 07/25/2023: B Natriuretic Peptide 240.9 07/26/2023: Hemoglobin 10.0; Magnesium 2.2; Platelets 264 09/06/2023: ALT 19; BUN 46; Creatinine, Ser 2.61; Potassium 5.3; Sodium 141   Recent Lipid Panel    Component Value Date/Time   CHOL 129 09/06/2023 1319   TRIG 46 09/06/2023 1319   HDL 45 09/06/2023 1319   CHOLHDL 2.9 09/06/2023 1319   CHOLHDL 4.9 05/13/2013 0437   VLDL 20 05/13/2013 0437   LDLCALC 73 09/06/2023 1319    Physical Exam:    VS:  BP 130/62 (BP Location: Left Arm)   Pulse 78   Ht 5\' 8"  (1.727 m)   Wt 227 lb 3.2 oz (103.1 kg)   SpO2 99%   BMI 34.55 kg/m  , BMI Body mass index is 34.55 kg/m. GENERAL:  Well appearing; ambulating independently in wheelchair HEENT: Pupils equal round and reactive, fundi not visualized, oral mucosa unremarkable NECK:  No jugular venous distention, waveform within normal limits, carotid  upstroke brisk and  symmetric, no bruits, no thyromegaly LUNGS:  Clear to auscultation bilaterally HEART:  RRR.  PMI not displaced or sustained,S1 and S2 within normal limits, no S3, no S4, no clicks, no rubs, no murmurs ABD:  Flat, positive bowel sounds normal in frequency in pitch, no bruits, no rebound, no guarding, no midline pulsatile mass, no hepatomegaly, no splenomegaly EXT:  2 plus pulses throughout, + left LE edema, right LE in cast, no cyanosis no clubbing SKIN:  No rashes no nodules NEURO:  Cranial nerves II through XII grossly intact, motor grossly intact throughout PSYCH:  Cognitively intact, oriented to person place and time  ASSESSMENT/PLAN:    Assessment & Plan # Hypertension # HFpEF: Blood pressure well-controlled with current regimen. Recent home reading elevated, office reading normal. Home monitoring system may be in place. - Continue current antihypertensive medications (amlodipine, carvedilol, hydralazine and spironolactone) - Monitor blood pressure at home and report significant changes.  # CKD IV: Renal function has been declining.  She is no longer on Farxiga.  Hydration advised by nephrology.. Kidney function monitored by nephrologist. Dialysis classes scheduled.  BP now better controlled.  # Hyperlipidemia On atorvastatin 80 mg for cholesterol management. - Continue atorvastatin 80 mg.  # Allergic Rhinitis Symptoms due to seasonal allergies, likely pollen exposure. No fluid overload or pulmonary issues. - Recommend Flonase nasal spray. - Recommend Zyrtec. - Advise to purchase medications at pharmacy.  # Follow-up Monitor condition, adjust follow-up based on stability. Last visit January, extend follow-up if stable. - Schedule follow-up in four months. - Advise to contact if symptoms worsen or new symptoms arise.    Screening for Secondary Hypertension:     01/11/2021    3:35 PM  Causes  Drugs/Herbals Screened  Sleep Apnea Screened     - Comments  snores  Thyroid Disease Screened  Hyperaldosteronism Screened     - Comments 2018    Relevant Labs/Studies:    Latest Ref Rng & Units 09/06/2023    1:19 PM 07/26/2023    5:37 AM 07/25/2023    3:27 PM  Basic Labs  Sodium 134 - 144 mmol/L 141  139  141   Potassium 3.5 - 5.2 mmol/L 5.3  4.2  4.7   Creatinine 0.57 - 1.00 mg/dL 1.61  0.96  0.45        Latest Ref Rng & Units 01/11/2021    3:52 PM 02/27/2019   11:29 AM  Thyroid   TSH 0.450 - 4.500 uIU/mL 0.744  0.665        Latest Ref Rng & Units 01/11/2021    3:52 PM 02/27/2017   11:23 AM 02/27/2017   11:22 AM 02/27/2017   11:20 AM 02/27/2017   11:16 AM 02/27/2017   11:14 AM 02/27/2017   10:53 AM  Renin/Aldosterone   Aldosterone 0.0 - 30.0 ng/dL 40.9  81.1  91.4  78.2  <1.0  5,161.8  1.4   Renin 0.167 - 5.380 ng/mL/hr 1.455         Aldos/Renin Ratio 0.0 - 30.0 7.3                 Latest Ref Rng & Units 02/27/2017   11:23 AM 02/27/2017   11:22 AM 02/27/2017   11:20 AM 02/27/2017   11:16 AM 02/27/2017   11:14 AM 02/27/2017   10:53 AM 02/27/2017   10:40 AM  Cortisol  Cortisol  ug/dL 95.6  21.3  08.6  57.8  >100.0  5.0  23.6      Disposition:  FU with Advanced HTN clinic in 4 months.  Medication Adjustments/Labs and Tests Ordered: Current medicines are reviewed at length with the patient today.  Concerns regarding medicines are outlined above.   No orders of the defined types were placed in this encounter.  No orders of the defined types were placed in this encounter.   Signed, Chilton Si, MD  11/24/2023 6:51 PM    [facility]

## 2023-11-14 NOTE — Telephone Encounter (Addendum)
 11/14/2023 Name: [name] MRN: [id number] DOB: 1960/11/01  Chief Complaint  Patient presents with   Medication Management    Hypertension     [name] is a 63 y.o. year old female who presented for a telephone visit.   They were referred to the pharmacist by their PCP for assistance in managing hypertension.    Subjective:  Care Team: Primary Care Provider: [name], FNP ; Next Scheduled Visit: 12/17/23 Cardiologist: Dr, Duke Salvia; Next Scheduled Visit: 11/14/2023  Medication Access/Adherence  Current Pharmacy:  [id number] [place] - [address] AT [facility] [address] [place] [address] Phone: [phone] Fax: [phone]   Patient reports affordability concerns with their medications: No  Patient reports access/transportation concerns to their pharmacy: No  Patient reports adherence concerns with their medications:  No       Hypertension:  Current medications:    Amlodipine 10mg  Daily Carvedilol 12.5mg  1 tablet twice daily Furosemide 40 mg 1 tablet daily Hydralazine 50mg  1 tablet three times daily Spironolactone 25mg  1 tablet daily  Patient has a validated, automated, upper arm home BP cuff Current blood pressure readings readings: 168/92 This morning.  (Had just taken her medications at the time of our call)  Patient denies hypotensive s/sx including  dizziness, lightheadedness.  Patient denies hypertensive symptoms including  headache, chest pain, shortness of breath   Patient reported taking all of her medications as prescribed.  She had been off of Hydralazine but this was called in for her in February and she said she picked it up at her local pharmacy on 10/11/23.   She has an appointment to see Cardiology today.   Objective:  Lab Results  Component Value Date   HGBA1C 4.9 08/23/2021    Lab Results  Component Value Date   CREATININE 2.61 (H) 09/06/2023   BUN 46 (H) 09/06/2023   NA  141 09/06/2023   K 5.3 (H) 09/06/2023   CL 103 09/06/2023   CO2 21 09/06/2023    Lab Results  Component Value Date   CHOL 129 09/06/2023   HDL 45 09/06/2023   LDLCALC 73 09/06/2023   TRIG 46 09/06/2023   CHOLHDL 2.9 09/06/2023    Medications Reviewed Today     Reviewed by [name], RPH (Pharmacist) on 11/14/23 at 1009  Med List Status: <None>   Medication Order Taking? Sig Documenting Provider Last Dose Status Informant  acetaminophen (TYLENOL) 325 MG tablet [id number] Yes Take 650 mg by mouth every 6 (six) hours as needed for mild pain or headache. [provider] Taking Active Self  amLODipine (NORVASC) 10 MG tablet [id number] Yes Take 1 tablet (10 mg total) by mouth daily. Ellender Hose, NP Taking Active Self           Med Note Lenoria Farrier   Tue Apr 10, 2023  2:30 PM)    aspirin EC 81 MG tablet [id number] Yes Take 1 tablet (81 mg total) by mouth 2 (two) times daily. For blood clot prevention. Take for 30 days after surgery. Swallow whole.  Patient taking differently: Take 81 mg by mouth daily. For blood clot prevention. Take for 30 days after surgery. Swallow whole.   [place], Jesse J, PA-C Taking Active Self  atorvastatin (LIPITOR) 80 MG tablet [id number] Yes Take 1 tablet (80 mg total) by mouth daily. Chilton Si, MD Taking Active Self  carvedilol (COREG) 12.5 MG tablet [id number] Yes Take 1 tablet (12.5 mg total) by mouth 2 (two)  times daily with a meal. Chilton Si, MD Taking Active Self  FARXIGA 10 MG TABS tablet [id number] No Take 1 tablet (10 mg total) by mouth daily.  Patient not taking: Reported on 11/14/2023   Ellender Hose, NP Not Taking Active Self           Med Note Grayce Sessions Nov 14, 2023  9:53 AM) Held my Nephrologist  ferrous gluconate (FERGON) 324 MG tablet [id number] Yes Take 1 tablet (324 mg total) by mouth daily with breakfast. [name], FNP Taking Active   furosemide (LASIX) 40 MG tablet [id number] Yes Take 1 tablet (40  mg total) by mouth daily. [name], FNP Taking Active   hydrALAZINE (APRESOLINE) 50 MG tablet [id number] Yes Take 1 tablet (50 mg total) by mouth 3 (three) times daily. [name], FNP Taking Active   pantoprazole (PROTONIX) 40 MG tablet [id number] Yes Take 40 mg by mouth daily. [provider] Taking Active   polyethylene glycol (MIRALAX / GLYCOLAX) 17 g packet [id number] Yes Take 17 g by mouth daily as needed for mild constipation or moderate constipation. [provider] Taking Active Self  spironolactone (ALDACTONE) 25 MG tablet [id number] Yes Take 1 tablet (25 mg total) by mouth daily. Chilton Si, MD Taking Active               Assessment/Plan:   Hypertension: - Currently uncontrolled patient reported home blood pressure:  168/92 - Recommend to keep her appointment today with cardiology.  -Instructed patient to take her medication bottles with her to her appointment today. -depending on her pulse, there may be some room to increase the Carvedilol dose from 12.5 mg twice daily to 25 mg twice daily   Follow Up Plan:    Route note to Dr. Duke Salvia Follow up with Patient in 1 week.   [name], PharmD, BCACP Clinical Pharmacist [phone]

## 2023-11-14 NOTE — Patient Instructions (Signed)
 Medication Instructions:  GO TO PHARMACY DOWNSTAIRS AND GET FLONASE AND ZYRTEC, BOTH PLAIN  OK TO GET GENERIC   Labwork: NONE  Testing/Procedures: NONE  Follow-Up: 4 MONTHS WITH DR [place] OR CAITLIN W NP   If you need a refill on your cardiac medications before your next appointment, please call your pharmacy.

## 2023-11-15 ENCOUNTER — Ambulatory Visit: Payer: Self-pay

## 2023-11-15 NOTE — Patient Instructions (Signed)
 Visit Information  Thank you for taking time to visit with me today. Please don't hesitate to contact me if I can be of assistance to you.   Following are the goals we discussed today:   Goals Addressed             This Visit's Progress    To better manage CHF   On track    Care Coordination Interventions: Basic overview and discussion of pathophysiology of Heart Failure reviewed Provided education on low sodium diet Reviewed Heart Failure Action Plan in depth and provided written copy Discussed importance of daily weight and advised patient to weigh and record daily Discussed patient reported weight today of 227 lbs Discussed plans with patient for ongoing care coordination follow up and provided patient with direct contact information for nurse care coordinator Scheduled nurse follow up call with patient for 12/19/23 @11 :00 AM        To obtain consistent BP control   On track    Care Coordination Interventions:  Evaluation of current treatment plan related to hypertension self management and patient's adherence to plan as established by provider;   Reviewed medications with patient and discussed importance of compliance;   Advised patient, providing education and rationale, to monitor blood pressure daily and record, calling PCP for findings outside established parameters;  Reviewed low sodium diet Discussed plans with patient for ongoing care coordination follow up and provided patient with direct contact information for nurse care coordinator Scheduled nurse follow up call with patient for 12/19/23 @11 :00 AM Last practice recorded BP readings:  BP Readings from Last 3 Encounters:  11/14/23 130/62  09/06/23 (!) 148/60  08/13/23 128/78   Most recent eGFR/CrCl:  Lab Results  Component Value Date   EGFR 18.0 11/05/2023    No components found for: "CRCL"        To recover from ankle fracture   On track    Care Coordination Interventions: Evaluation of current treatment  plan related to right ankle fracture and patient's adherence to plan as established by provider Determined patient's orthopedic surgeon renewed her PT Counseled patient on the importance of adhering to her HEP Discussed plans with patient for ongoing care coordination follow up and provided patient with direct contact information for nurse care coordinator Scheduled nurse follow up call with patient for 12/19/23 @11 :00 AM        Our next appointment is by telephone on 12/19/23 at 11:00 AM  Please call the care guide team at [phone] if you need to cancel or reschedule your appointment.   If you are experiencing a Mental Health or Behavioral Health Crisis or need someone to talk to, please call 1-800-273-TALK (toll free, 24 hour hotline)  The patient verbalized understanding of instructions, educational materials, and care plan provided today and DECLINED offer to receive copy of patient instructions, educational materials, and care plan.   Delsa Sale RN BSN CCM [place]  [facility], [facility] Nurse Care Coordinator  Direct Dial: [phone] Website: [email or web address]

## 2023-11-15 NOTE — Patient Outreach (Signed)
 Care Coordination   Follow Up Visit Note   11/15/2023 Name: Mayerli Kirst MRN: [id number] DOB: 01/04/1961  Nisha Dhami is a 63 y.o. year old female who sees [name], FNP for primary care. I spoke with  [name] by phone today.  What matters to the patients health and wellness today?  Patient would like to continue to improve self-health management of her blood pressure and heart failure.     Goals Addressed             This Visit's Progress    To better manage CHF   On track    Care Coordination Interventions: Basic overview and discussion of pathophysiology of Heart Failure reviewed Provided education on low sodium diet Reviewed Heart Failure Action Plan in depth and provided written copy Discussed importance of daily weight and advised patient to weigh and record daily Discussed patient reported weight today of 227 lbs Discussed plans with patient for ongoing care coordination follow up and provided patient with direct contact information for nurse care coordinator Scheduled nurse follow up call with patient for 12/19/23 @11 :00 AM     To obtain consistent BP control   On track    Care Coordination Interventions:  Evaluation of current treatment plan related to hypertension self management and patient's adherence to plan as established by provider;   Reviewed medications with patient and discussed importance of compliance;   Advised patient, providing education and rationale, to monitor blood pressure daily and record, calling PCP for findings outside established parameters;  Reviewed low sodium diet Discussed plans with patient for ongoing care coordination follow up and provided patient with direct contact information for nurse care coordinator Scheduled nurse follow up call with patient for 12/19/23 @11 :00 AM Last practice recorded BP readings:  BP Readings from Last 3 Encounters:  11/14/23 130/62  09/06/23 (!) 148/60  08/13/23 128/78   Most recent  eGFR/CrCl:  Lab Results  Component Value Date   EGFR 18.0 11/05/2023    No components found for: "CRCL"     To recover from ankle fracture   On track    Care Coordination Interventions: Evaluation of current treatment plan related to right ankle fracture and patient's adherence to plan as established by provider Determined patient's orthopedic surgeon renewed her PT Counseled patient on the importance of adhering to her HEP Discussed plans with patient for ongoing care coordination follow up and provided patient with direct contact information for nurse care coordinator Scheduled nurse follow up call with patient for 12/19/23 @11 :00 AM    Interventions Today    Flowsheet Row Most Recent Value  Chronic Disease   Chronic disease during today's visit Hypertension (HTN), Congestive Heart Failure (CHF)  General Interventions   General Interventions Discussed/Reviewed General Interventions Discussed, General Interventions Reviewed, Doctor Visits  Doctor Visits Discussed/Reviewed Doctor Visits Discussed, Doctor Visits Reviewed, PCP, Specialist  Education Interventions   Education Provided Provided Education  Provided Verbal Education On Medication, When to see the doctor  Pharmacy Interventions   Pharmacy Dicussed/Reviewed Pharmacy Topics Discussed, Pharmacy Topics Reviewed, Medication Adherence, Medications and their functions          SDOH assessments and interventions completed:  No     Care Coordination Interventions:  Yes, provided   Follow up plan: Follow up call scheduled for 12/19/23 @11 :00 AM    Encounter Outcome:  Patient Visit Completed

## 2023-11-21 ENCOUNTER — Telehealth: Payer: Self-pay | Admitting: Pharmacist

## 2023-11-21 DIAGNOSIS — I5033 Acute on chronic diastolic (congestive) heart failure: Secondary | ICD-10-CM

## 2023-11-21 MED ORDER — FERROUS GLUCONATE 324 (38 FE) MG PO TABS: 324.0000 mg | ORAL_TABLET | Freq: Every day | ORAL | 3 refills | Status: DC

## 2023-11-21 NOTE — Progress Notes (Addendum)
 11/21/2023 Name: [name] MRN: [id number] DOB: 1961-03-12  Chief Complaint  Patient presents with   Medication Management    Hypertension    [name] is a 63 y.o. year old female who presented for a telephone visit.   They were referred to the pharmacist by their PCP for assistance in managing hypertension.    Subjective:  Care Team: Primary Care Provider: [name], FNP ; Next Scheduled Visit: 12/17/23   Medication Access/Adherence  Current Pharmacy:  [id number] - [place], [place] - [address] AT [facility] [address] [place] [address] Phone: [phone] Fax: [phone]   Patient reports affordability concerns with their medications: No  Patient reports access/transportation concerns to their pharmacy: No  Patient reports adherence concerns with their medications:  Yes      Hypertension:  Current medications:   Amlodipine 10 mg 1 tablet daily Carvedilol 12.5mg  1 tablet twice daily Hydralazine 50 mg 1 tablet three times daily Furosemide 40 mg 1 tablet daily Spironolactone 25 mg 1 tablet daily   Patient has a validated, automated, upper arm home BP cuff Current blood pressure readings readings: 151/99 (home blood pressure during our phone call)  Patient denies hypotensive s/sx including  dizziness, lightheadedness.  Patient denies hypertensive symptoms including  headache, chest pain, shortness of breath     Objective:  Lab Results  Component Value Date   HGBA1C 4.9 08/23/2021    Lab Results  Component Value Date   CREATININE 2.61 (H) 09/06/2023   BUN 46 (H) 09/06/2023   NA 141 09/06/2023   K 5.3 (H) 09/06/2023   CL 103 09/06/2023   CO2 21 09/06/2023    Lab Results  Component Value Date   CHOL 129 09/06/2023   HDL 45 09/06/2023   LDLCALC 73 09/06/2023   TRIG 46 09/06/2023   CHOLHDL 2.9 09/06/2023    Medications Reviewed Today     Reviewed by [name], RPH (Pharmacist) on 11/21/23 at  1424  Med List Status: <None>   Medication Order Taking? Sig Documenting Provider Last Dose Status Informant  acetaminophen (TYLENOL) 325 MG tablet [id number] Yes Take 650 mg by mouth every 6 (six) hours as needed for mild pain or headache. [provider] Taking Active Self  amLODipine (NORVASC) 10 MG tablet [id number] Yes Take 1 tablet (10 mg total) by mouth daily. Ellender Hose, NP Taking Active Self           Med Note Lenoria Farrier   Tue Apr 10, 2023  2:30 PM)    aspirin EC 81 MG tablet [id number] Yes Take 1 tablet (81 mg total) by mouth 2 (two) times daily. For blood clot prevention. Take for 30 days after surgery. Swallow whole.  Patient taking differently: Take 81 mg by mouth daily. For blood clot prevention. Take for 30 days after surgery. Swallow whole.   [place], Jesse J, PA-C Taking Active Self  atorvastatin (LIPITOR) 80 MG tablet [id number] Yes Take 1 tablet (80 mg total) by mouth daily. Chilton Si, MD Taking Active Self  carvedilol (COREG) 12.5 MG tablet [id number] Yes Take 1 tablet (12.5 mg total) by mouth 2 (two) times daily with a meal. Chilton Si, MD Taking Active Self  cetirizine (ZYRTEC) 10 MG tablet [id number] Yes Take 10 mg by mouth daily as needed for allergies. [provider] Taking Active   FARXIGA 10 MG TABS tablet [id number]  Take 1 tablet (10 mg total) by mouth daily.  Patient not taking: Reported on  11/14/2023   Ellender Hose, NP  Active Self           Med Note Leavy Cella, Romilda Joy   Wed Nov 14, 2023  9:53 AM) Held my Nephrologist  ferrous gluconate (FERGON) 324 MG tablet [id number] Yes Take 1 tablet (324 mg total) by mouth daily with breakfast. [name], FNP Taking Active   fluticasone (FLONASE) 50 MCG/ACT nasal spray [id number] Yes Place 1 spray into both nostrils daily. EACH NOSTRIL AS NEEDED [provider] Taking Active   furosemide (LASIX) 40 MG tablet [id number]  Take 1 tablet (40 mg total) by mouth daily. [name], FNP   Active   hydrALAZINE (APRESOLINE) 50 MG tablet [id number] Yes Take 1 tablet (50 mg total) by mouth 3 (three) times daily. [name], FNP Taking Active   pantoprazole (PROTONIX) 40 MG tablet [id number] Yes Take 40 mg by mouth daily. [provider] Taking Active   polyethylene glycol (MIRALAX / GLYCOLAX) 17 g packet [id number] Yes Take 17 g by mouth daily as needed for mild constipation or moderate constipation. [provider] Taking Active Self  spironolactone (ALDACTONE) 25 MG tablet [id number] Yes Take 1 tablet (25 mg total) by mouth daily. Chilton Si, MD Taking Active               Assessment/Plan:   Hypertension: - Currently uncontrolled - Reviewed long term cardiovascular and renal outcomes of uncontrolled blood pressure -- Recommend to continue current therapy for now. Carvedilol dose may be able to be increased from 12.5 mg twice daily to 25 mg twice daily  -Patient reported being in need of ferrous gluconate refills. --they will be sent to her provider for signature.    Follow Up Plan:    Follow up with Janece prior to the 12/17/23 appt about the potential of increasing the Carvediolol dose.    [name], PharmD, BCACP Clinical Pharmacist [phone]

## 2023-11-21 NOTE — Addendum Note (Signed)
 Addended by: [name] on: 11/21/2023 04:13 PM   Modules accepted: Orders

## 2023-11-24 ENCOUNTER — Encounter (HOSPITAL_BASED_OUTPATIENT_CLINIC_OR_DEPARTMENT_OTHER): Payer: Self-pay | Admitting: Cardiovascular Disease

## 2023-12-04 ENCOUNTER — Other Ambulatory Visit: Payer: Self-pay | Admitting: Family Medicine

## 2023-12-04 DIAGNOSIS — I152 Hypertension secondary to endocrine disorders: Secondary | ICD-10-CM

## 2023-12-12 ENCOUNTER — Telehealth: Payer: Self-pay | Admitting: Pharmacist

## 2023-12-12 DIAGNOSIS — I110 Hypertensive heart disease with heart failure: Secondary | ICD-10-CM

## 2023-12-12 NOTE — Progress Notes (Signed)
   12/12/2023  Patient ID: [name], female   DOB: May 30, 1961, 63 y.o.   MRN: [id number]  Pharmacy Quality Measure Review  This patient is appearing on a report for being at risk of failing the adherence measure for diabetes medications this calendar year.   Medication: Farxiga  10 mg Last fill date: 10/24/2023 for 30 day supply  Spoke with Patient and confirmed via chart review, that Farxiga  is being held by her nephrologist.  Last impact date is 01/24/24.  Patient reported having and appointment with her nephrologist later this month.  Plan: Will follow up after her nephro appointment. If farxiga  is restarted will request a 90 day supply prescription be sent to her pharmacy.   Geronimo Krabbe, PharmD, BCACP Clinical Pharmacist [phone]

## 2023-12-17 ENCOUNTER — Encounter: Admitting: Nurse Practitioner

## 2023-12-18 DIAGNOSIS — N2581 Secondary hyperparathyroidism of renal origin: Secondary | ICD-10-CM | POA: Diagnosis not present

## 2023-12-18 DIAGNOSIS — D631 Anemia in chronic kidney disease: Secondary | ICD-10-CM | POA: Diagnosis not present

## 2023-12-18 DIAGNOSIS — I509 Heart failure, unspecified: Secondary | ICD-10-CM | POA: Diagnosis not present

## 2023-12-18 DIAGNOSIS — N184 Chronic kidney disease, stage 4 (severe): Secondary | ICD-10-CM | POA: Diagnosis not present

## 2023-12-18 DIAGNOSIS — I129 Hypertensive chronic kidney disease with stage 1 through stage 4 chronic kidney disease, or unspecified chronic kidney disease: Secondary | ICD-10-CM | POA: Diagnosis not present

## 2023-12-19 LAB — LAB REPORT - SCANNED: EGFR: 22

## 2024-01-04 ENCOUNTER — Other Ambulatory Visit (HOSPITAL_COMMUNITY): Payer: Self-pay | Admitting: *Deleted

## 2024-01-07 ENCOUNTER — Ambulatory Visit (HOSPITAL_COMMUNITY)
Admission: RE | Admit: 2024-01-07 | Discharge: 2024-01-07 | Disposition: A | Discharge status: Home / Self Care | Source: Ambulatory Visit | Attending: Nephrology | Admitting: Nephrology

## 2024-01-07 DIAGNOSIS — D631 Anemia in chronic kidney disease: Secondary | ICD-10-CM | POA: Diagnosis not present

## 2024-01-07 DIAGNOSIS — N189 Chronic kidney disease, unspecified: Secondary | ICD-10-CM | POA: Diagnosis not present

## 2024-01-07 MED ORDER — IRON SUCROSE 200 MG IVPB - SIMPLE MED
200.0000 mg | Status: DC
  Administered 2024-01-07: 200 mg via INTRAVENOUS
  Filled 2024-01-07: qty 200

## 2024-01-15 ENCOUNTER — Encounter (HOSPITAL_COMMUNITY)
Admission: RE | Admit: 2024-01-15 | Discharge: 2024-01-15 | Disposition: A | Discharge status: Home / Self Care | Source: Ambulatory Visit | Attending: Nephrology | Admitting: Nephrology

## 2024-01-15 ENCOUNTER — Telehealth: Payer: Self-pay

## 2024-01-15 DIAGNOSIS — D631 Anemia in chronic kidney disease: Secondary | ICD-10-CM | POA: Insufficient documentation

## 2024-01-15 DIAGNOSIS — N189 Chronic kidney disease, unspecified: Secondary | ICD-10-CM | POA: Diagnosis not present

## 2024-01-15 MED ORDER — IRON SUCROSE 200 MG IVPB - SIMPLE MED
200.0000 mg | Status: DC
  Administered 2024-01-15: 200 mg via INTRAVENOUS
  Filled 2024-01-15: qty 200

## 2024-01-16 ENCOUNTER — Other Ambulatory Visit: Payer: Self-pay | Admitting: Orthopaedic Surgery

## 2024-01-17 ENCOUNTER — Telehealth: Payer: Self-pay

## 2024-01-17 NOTE — Telephone Encounter (Signed)
 Auth Submission: NO AUTH NEEDED Site of care: Site of care: [facility] Payer: [employer] dual complete Medication & CPT/J Code(s) submitted: Venofer  (Iron  Sucrose) J1756 Route of submission (phone, fax, portal):  Phone # Fax # Auth type: Buy/Bill PB Units/visits requested: 200mg  x 5 doses Reference number:  Approval from: 01/17/24 to 08/20/24

## 2024-01-22 ENCOUNTER — Ambulatory Visit (HOSPITAL_COMMUNITY)
Admission: RE | Admit: 2024-01-22 | Discharge: 2024-01-22 | Disposition: A | Discharge status: Home / Self Care | Source: Ambulatory Visit | Attending: Nephrology | Admitting: Nephrology

## 2024-01-22 DIAGNOSIS — Z01810 Encounter for preprocedural cardiovascular examination: Secondary | ICD-10-CM | POA: Insufficient documentation

## 2024-01-22 DIAGNOSIS — N184 Chronic kidney disease, stage 4 (severe): Secondary | ICD-10-CM | POA: Diagnosis not present

## 2024-01-22 DIAGNOSIS — D631 Anemia in chronic kidney disease: Secondary | ICD-10-CM | POA: Diagnosis not present

## 2024-01-22 MED ORDER — IRON SUCROSE 200 MG IVPB - SIMPLE MED
200.0000 mg | Status: DC
  Administered 2024-01-22: 200 mg via INTRAVENOUS
  Filled 2024-01-22: qty 200

## 2024-01-23 ENCOUNTER — Telehealth: Payer: Self-pay | Admitting: Pharmacist

## 2024-01-23 DIAGNOSIS — Z79899 Other long term (current) drug therapy: Secondary | ICD-10-CM

## 2024-01-23 NOTE — Progress Notes (Signed)
 Pharmacy Quality Measure Review  This patient is appearing on a report for being at risk of failing the adherence measure for diabetes medications this calendar year.   Medication: Farxiga  10 mg Last fill date: 10/24/23 for 30 day supply  Discussed barriers to adherence, which included Farxiga  held by Nephrology. Patient reported her follow up appointment is not until 01/31/24.  Last impact date is 01/30/24.  Patient will NOT pass the measure.  Geronimo Krabbe, PharmD, BCACP Clinical Pharmacist [phone]

## 2024-01-29 ENCOUNTER — Encounter (HOSPITAL_COMMUNITY)
Admission: RE | Admit: 2024-01-29 | Discharge: 2024-01-29 | Disposition: A | Discharge status: Home / Self Care | Source: Ambulatory Visit | Attending: Nephrology | Admitting: Nephrology

## 2024-01-29 DIAGNOSIS — D631 Anemia in chronic kidney disease: Secondary | ICD-10-CM | POA: Diagnosis not present

## 2024-01-29 DIAGNOSIS — N189 Chronic kidney disease, unspecified: Secondary | ICD-10-CM | POA: Insufficient documentation

## 2024-01-29 MED ORDER — IRON SUCROSE 200 MG IVPB - SIMPLE MED
200.0000 mg | Status: DC
  Administered 2024-01-29: 200 mg via INTRAVENOUS
  Filled 2024-01-29: qty 200

## 2024-02-05 ENCOUNTER — Encounter (HOSPITAL_COMMUNITY)
Admission: RE | Admit: 2024-02-05 | Discharge: 2024-02-05 | Disposition: A | Discharge status: Home / Self Care | Source: Ambulatory Visit | Attending: Nephrology

## 2024-02-05 DIAGNOSIS — D631 Anemia in chronic kidney disease: Secondary | ICD-10-CM | POA: Diagnosis not present

## 2024-02-05 DIAGNOSIS — N189 Chronic kidney disease, unspecified: Secondary | ICD-10-CM | POA: Diagnosis not present

## 2024-02-05 MED ORDER — IRON SUCROSE 200 MG IVPB - SIMPLE MED
200.0000 mg | Status: DC
  Administered 2024-02-05: 200 mg via INTRAVENOUS
  Filled 2024-02-05: qty 200

## 2024-02-06 NOTE — Progress Notes (Signed)
 Surgical Instructions   Your procedure is scheduled on July 1,2025. Report to [facility] Main Entrance A at 5:30 A.M., then check in with the Admitting office. Any questions or running late day of surgery: call [phone]  Questions prior to your surgery date: call [phone], Monday-Friday, 8am-4pm. If you experience any cold or flu symptoms such as cough, fever, chills, shortness of breath, etc. between now and your scheduled surgery, please notify us  at the above number.     Remember:  Do not eat after midnight the night before your surgery  You may drink clear liquids until 4:30 the morning of your surgery.   Clear liquids allowed are: Water, Non-Citrus Juices (without pulp), Carbonated Beverages, Clear Tea (no milk, honey, etc.), Black Coffee Only (NO MILK, CREAM OR POWDERED CREAMER of any kind), and Gatorade. Patient Instructions  The night before surgery:  No food after midnight. ONLY clear liquids after midnight  The day of surgery (if you have diabetes): Drink ONE (1) 12 oz G2 given to you in your pre admission testing appointment by 4:30 the morning of surgery. Drink in one sitting. Do not sip.  This drink was given to you during your hospital  pre-op appointment visit.  Nothing else to drink after completing the  12 oz bottle of G2.         If you have questions, please contact your surgeon's office.    Take these medicines the morning of surgery with A SIP OF WATER  amLODipine  (NORVASC )  atorvastatin  (LIPITOR)  carvedilol  (COREG )  hydrALAZINE  (APRESOLINE )  pantoprazole  (PROTONIX )   May take these medicines IF NEEDED: acetaminophen  (TYLENOL )  cetirizine (ZYRTEC)   One week prior to surgery, STOP taking any Aspirin  (unless otherwise instructed by your surgeon) Aleve, Naproxen, Ibuprofen , Motrin , Advil , Goody's, BC's, all herbal medications, fish oil, and non-prescription vitamins.    WHAT DO I DO ABOUT MY DIABETES MEDICATION?   Do not take oral diabetes  medicines  FARXIGA  the morning of surgery.  FARXIGA  LAST DOSE: 02-16-24  The day of surgery, do not take other diabetes injectables, including Byetta (exenatide), Bydureon (exenatide ER), Victoza (liraglutide), or Trulicity (dulaglutide).  If your CBG is greater than 220 mg/dL, you may take  of your sliding scale (correction) dose of insulin .   HOW TO MANAGE YOUR DIABETES BEFORE AND AFTER SURGERY  Why is it important to control my blood sugar before and after surgery? Improving blood sugar levels before and after surgery helps healing and can limit problems. A way of improving blood sugar control is eating a healthy diet by:  Eating less sugar and carbohydrates  Increasing activity/exercise  Talking with your doctor about reaching your blood sugar goals High blood sugars (greater than 180 mg/dL) can raise your risk of infections and slow your recovery, so you will need to focus on controlling your diabetes during the weeks before surgery. Make sure that the doctor who takes care of your diabetes knows about your planned surgery including the date and location.  How do I manage my blood sugar before surgery? Check your blood sugar at least 4 times a day, starting 2 days before surgery, to make sure that the level is not too high or low.  Check your blood sugar the morning of your surgery when you wake up and every 2 hours until you get to the Short Stay unit.  If your blood sugar is less than 70 mg/dL, you will need to treat for low blood sugar: Do not take insulin .  Treat a low blood sugar (less than 70 mg/dL) with  cup of clear juice (cranberry or apple), 4 glucose tablets, OR glucose gel. Recheck blood sugar in 15 minutes after treatment (to make sure it is greater than 70 mg/dL). If your blood sugar is not greater than 70 mg/dL on recheck, call [phone] for further instructions. Report your blood sugar to the short stay nurse when you get to Short Stay.  If you are admitted to  the hospital after surgery: Your blood sugar will be checked by the staff and you will probably be given insulin  after surgery (instead of oral diabetes medicines) to make sure you have good blood sugar levels. The goal for blood sugar control after surgery is 80-180 mg/dL.                  Do NOT Smoke (Tobacco/Vaping) for 24 hours prior to your procedure.  If you use a CPAP at night, you may bring your mask/headgear for your overnight stay.   You will be asked to remove any contacts, glasses, piercing's, hearing aid's, dentures/partials prior to surgery. Please bring cases for these items if needed.    Patients discharged the day of surgery will not be allowed to drive home, and someone needs to stay with them for 24 hours.  SURGICAL WAITING ROOM VISITATION Patients may have no more than 2 support people in the waiting area - these visitors may rotate.   Pre-op nurse will coordinate an appropriate time for 1 ADULT support person, who may not rotate, to accompany patient in pre-op.  Children under the age of 64 must have an adult with them who is not the patient and must remain in the main waiting area with an adult.  If the patient needs to stay at the hospital during part of their recovery, the visitor guidelines for inpatient rooms apply.  Please refer to the [facility] website for the visitor guidelines for any additional information.   If you received a COVID test during your pre-op visit  it is requested that you wear a mask when out in public, stay away from anyone that may not be feeling well and notify your surgeon if you develop symptoms. If you have been in contact with anyone that has tested positive in the last 10 days please notify you surgeon.      Pre-operative 5 CHG Bathing Instructions   You can play a key role in reducing the risk of infection after surgery. Your skin needs to be as free of germs as possible. You can reduce the number of germs on your skin by washing  with CHG (chlorhexidine  gluconate) soap before surgery. CHG is an antiseptic soap that kills germs and continues to kill germs even after washing.   DO NOT use if you have an allergy to chlorhexidine /CHG or antibacterial soaps. If your skin becomes reddened or irritated, stop using the CHG and notify one of our RNs at [phone].   Please shower with the CHG soap starting 4 days before surgery using the following schedule:     Please keep in mind the following:  DO NOT shave, including legs and underarms, starting the day of your first shower.   You may shave your face at any point before/day of surgery.  Place clean sheets on your bed the day you start using CHG soap. Use a clean washcloth (not used since being washed) for each shower. DO NOT sleep with pets once you start using the CHG.  CHG Shower Instructions:  Wash your face and private area with normal soap. If you choose to wash your hair, wash first with your normal shampoo.  After you use shampoo/soap, rinse your hair and body thoroughly to remove shampoo/soap residue.  Turn the water OFF and apply about 3 tablespoons (45 ml) of CHG soap to a CLEAN washcloth.  Apply CHG soap ONLY FROM YOUR NECK DOWN TO YOUR TOES (washing for 3-5 minutes)  DO NOT use CHG soap on face, private areas, open wounds, or sores.  Pay special attention to the area where your surgery is being performed.  If you are having back surgery, having someone wash your back for you may be helpful. Wait 2 minutes after CHG soap is applied, then you may rinse off the CHG soap.  Pat dry with a clean towel  Put on clean clothes/pajamas   If you choose to wear lotion, please use ONLY the CHG-compatible lotions that are listed below.  Additional instructions for the day of surgery: DO NOT APPLY any lotions, deodorants, cologne, or perfumes.   Do not bring valuables to the hospital. [facility] is not responsible for any belongings/valuables. Do not wear nail  polish, gel polish, artificial nails, or any other type of covering on natural nails (fingers and toes) Do not wear jewelry or makeup Put on clean/comfortable clothes.  Please brush your teeth.  Ask your nurse before applying any prescription medications to the skin.     CHG Compatible Lotions   Aveeno Moisturizing lotion  Cetaphil Moisturizing Cream  Cetaphil Moisturizing Lotion  Clairol Herbal Essence Moisturizing Lotion, Dry Skin  Clairol Herbal Essence Moisturizing Lotion, Extra Dry Skin  Clairol Herbal Essence Moisturizing Lotion, Normal Skin  Curel Age Defying Therapeutic Moisturizing Lotion with Alpha Hydroxy  Curel Extreme Care Body Lotion  Curel Soothing Hands Moisturizing Hand Lotion  Curel Therapeutic Moisturizing Cream, Fragrance-Free  Curel Therapeutic Moisturizing Lotion, Fragrance-Free  Curel Therapeutic Moisturizing Lotion, Original Formula  Eucerin Daily Replenishing Lotion  Eucerin Dry Skin Therapy Plus Alpha Hydroxy Crme  Eucerin Dry Skin Therapy Plus Alpha Hydroxy Lotion  Eucerin Original Crme  Eucerin Original Lotion  Eucerin Plus Crme Eucerin Plus Lotion  Eucerin TriLipid Replenishing Lotion  Keri Anti-Bacterial Hand Lotion  Keri Deep Conditioning Original Lotion Dry Skin Formula Softly Scented  Keri Deep Conditioning Original Lotion, Fragrance Free Sensitive Skin Formula  Keri Lotion Fast Absorbing Fragrance Free Sensitive Skin Formula  Keri Lotion Fast Absorbing Softly Scented Dry Skin Formula  Keri Original Lotion  Keri Skin Renewal Lotion Keri Silky Smooth Lotion  Keri Silky Smooth Sensitive Skin Lotion  Nivea Body Creamy Conditioning Oil  Nivea Body Extra Enriched Lotion  Nivea Body Original Lotion  Nivea Body Sheer Moisturizing Lotion Nivea Crme  Nivea Skin Firming Lotion  NutraDerm 30 Skin Lotion  NutraDerm Skin Lotion  NutraDerm Therapeutic Skin Cream  NutraDerm Therapeutic Skin Lotion  ProShield Protective Hand Cream  Provon  moisturizing lotion  Please read over the following fact sheets that you were given.

## 2024-02-08 ENCOUNTER — Encounter (HOSPITAL_COMMUNITY): Payer: Self-pay

## 2024-02-08 ENCOUNTER — Other Ambulatory Visit: Payer: Self-pay

## 2024-02-08 ENCOUNTER — Encounter (HOSPITAL_COMMUNITY)
Admission: RE | Admit: 2024-02-08 | Discharge: 2024-02-08 | Disposition: A | Discharge status: Home / Self Care | Source: Ambulatory Visit | Attending: Orthopaedic Surgery | Admitting: Orthopaedic Surgery

## 2024-02-08 VITALS — BP 168/78 | HR 69 | Temp 98.6°F | Resp 17 | Ht 68.0 in | Wt 236.9 lb

## 2024-02-08 DIAGNOSIS — K219 Gastro-esophageal reflux disease without esophagitis: Secondary | ICD-10-CM | POA: Diagnosis not present

## 2024-02-08 DIAGNOSIS — N184 Chronic kidney disease, stage 4 (severe): Secondary | ICD-10-CM | POA: Diagnosis not present

## 2024-02-08 DIAGNOSIS — Z01812 Encounter for preprocedural laboratory examination: Secondary | ICD-10-CM | POA: Insufficient documentation

## 2024-02-08 DIAGNOSIS — E785 Hyperlipidemia, unspecified: Secondary | ICD-10-CM | POA: Diagnosis not present

## 2024-02-08 DIAGNOSIS — I5032 Chronic diastolic (congestive) heart failure: Secondary | ICD-10-CM | POA: Insufficient documentation

## 2024-02-08 DIAGNOSIS — F17210 Nicotine dependence, cigarettes, uncomplicated: Secondary | ICD-10-CM | POA: Insufficient documentation

## 2024-02-08 DIAGNOSIS — M19071 Primary osteoarthritis, right ankle and foot: Secondary | ICD-10-CM | POA: Diagnosis not present

## 2024-02-08 DIAGNOSIS — D631 Anemia in chronic kidney disease: Secondary | ICD-10-CM | POA: Insufficient documentation

## 2024-02-08 DIAGNOSIS — I130 Hypertensive heart and chronic kidney disease with heart failure and stage 1 through stage 4 chronic kidney disease, or unspecified chronic kidney disease: Secondary | ICD-10-CM | POA: Diagnosis not present

## 2024-02-08 DIAGNOSIS — F1090 Alcohol use, unspecified, uncomplicated: Secondary | ICD-10-CM

## 2024-02-08 DIAGNOSIS — Z01818 Encounter for other preprocedural examination: Secondary | ICD-10-CM

## 2024-02-08 LAB — COMPREHENSIVE METABOLIC PANEL WITH GFR
ALT: 25 U/L (ref 0–44)
AST: 19 U/L (ref 15–41)
Albumin: 3.8 g/dL (ref 3.5–5.0)
Alkaline Phosphatase: 76 U/L (ref 38–126)
Anion gap: 8 (ref 5–15)
BUN: 29 mg/dL — ABNORMAL HIGH (ref 8–23)
CO2: 25 mmol/L (ref 22–32)
Calcium: 8.6 mg/dL — ABNORMAL LOW (ref 8.9–10.3)
Chloride: 105 mmol/L (ref 98–111)
Creatinine, Ser: 2.09 mg/dL — ABNORMAL HIGH (ref 0.44–1.00)
GFR, Estimated: 26 mL/min — ABNORMAL LOW (ref >60)
Glucose, Bld: 85 mg/dL (ref 70–99)
Potassium: 4.5 mmol/L (ref 3.5–5.1)
Sodium: 138 mmol/L (ref 135–145)
Total Bilirubin: 0.9 mg/dL (ref 0.0–1.2)
Total Protein: 7.6 g/dL (ref 6.5–8.1)

## 2024-02-08 LAB — CBC
HCT: 37.6 % (ref 36.0–46.0)
Hemoglobin: 10.9 g/dL — ABNORMAL LOW (ref 12.0–15.0)
MCH: 27.8 pg (ref 26.0–34.0)
MCHC: 29 g/dL — ABNORMAL LOW (ref 30.0–36.0)
MCV: 95.9 fL (ref 80.0–100.0)
Platelets: 282 10*3/uL (ref 150–400)
RBC: 3.92 MIL/uL (ref 3.87–5.11)
RDW: 13.2 % (ref 11.5–15.5)
WBC: 7.3 10*3/uL (ref 4.0–10.5)
nRBC: 0 % (ref 0.0–0.2)

## 2024-02-08 LAB — SURGICAL PCR SCREEN
MRSA, PCR: NEGATIVE
Staphylococcus aureus: NEGATIVE

## 2024-02-08 NOTE — Progress Notes (Addendum)
 Surgical Instructions    Your procedure is scheduled on Tuesday, July 1st.    Report to [facility] Main Entrance A at 0530 A.M., then check in with the Admitting office.  Call this number if you have problems the morning of surgery:  [phone]  If you have any questions prior to your surgery date call [phone]: Open Monday-Friday 8am-4pm If you experience any cold or flu symptoms such as cough, fever, chills, shortness of breath, etc. between now and your scheduled surgery, please notify us  at the above number.     Remember:  Do not eat after midnight the night before your surgery  You may drink clear liquids until 0430 AM the morning of your surgery.   Clear liquids allowed are: Water, Non-Citrus Juices (without pulp), Carbonated Beverages, Clear Tea, Black Coffee Only (NO MILK, CREAM OR POWDERED CREAMER of any kind), and Gatorade.  Patient Instructions  The night before surgery:  No food after midnight. ONLY clear liquids after midnight  The day of surgery (if you do NOT have diabetes):  Drink ONE (1) Pre-Surgery Clear Ensure by 0430 AM the morning of surgery. Drink in one sitting. Do not sip.  This drink was given to you during your hospital  pre-op appointment visit.  Nothing else to drink after completing the  Pre-Surgery Clear Ensure.  Take these medicines the morning of surgery with A SIP OF WATER  amLODipine  (NORVASC )  atorvastatin  (LIPITOR)  carvedilol  (COREG )  hydrALAZINE  (APRESOLINE )  pantoprazole  (PROTONIX )    May take these medicines IF NEEDED: acetaminophen  (TYLENOL )  cetirizine (ZYRTEC)   Hold Farxiga  for 72 hrs before your procedure. Last dose should be - June 27th.    As of today, STOP taking any Aspirin  (unless otherwise instructed by your surgeon) Aleve, Naproxen, Ibuprofen , Motrin , Advil , Goody's, BC's, all herbal medications, fish oil, and all vitamins.                     Do NOT Smoke (Tobacco/Vaping) for 24 hours prior to your  procedure.  If you use a CPAP at night, you may bring your mask/headgear for your overnight stay.   Contacts, glasses, piercing's, hearing aid's, dentures or partials may not be worn into surgery, please bring cases for these belongings.    For patients admitted to the hospital, discharge time will be determined by your treatment team.   Patients discharged the day of surgery will not be allowed to drive home, and someone needs to stay with them for 24 hours.  SURGICAL WAITING ROOM VISITATION Patients having surgery or a procedure may have no more than 2 support people in the waiting area - these visitors may rotate.   Children under the age of 71 must have an adult with them who is not the patient. If the patient needs to stay at the hospital during part of their recovery, the visitor guidelines for inpatient rooms apply. Pre-op nurse will coordinate an appropriate time for 1 support person to accompany patient in pre-op.  This support person may not rotate.   Please refer to the [facility] website for the visitor guidelines for Inpatients (after your surgery is over and you are in a regular room).    Special instructions:   [place]- Preparing For Surgery  Before surgery, you can play an important role. Because skin is not sterile, your skin needs to be as free of germs as possible. You can reduce the number of germs on your skin by washing with CHG (  chlorahexidine gluconate) Soap before surgery.  CHG is an antiseptic cleaner which kills germs and bonds with the skin to continue killing germs even after washing.    Oral Hygiene is also important to reduce your risk of infection.  Remember - BRUSH YOUR TEETH THE MORNING OF SURGERY WITH YOUR REGULAR TOOTHPASTE  Please do not use if you have an allergy to CHG or antibacterial soaps. If your skin becomes reddened/irritated stop using the CHG.  Do not shave (including legs and underarms) for at least 48 hours prior to first CHG shower. It  is OK to shave your face.  Please follow these instructions carefully.   Shower the NIGHT BEFORE SURGERY and the MORNING OF SURGERY  If you chose to wash your hair, wash your hair first as usual with your normal shampoo.  After you shampoo, rinse your hair and body thoroughly to remove the shampoo.  Use CHG Soap as you would any other liquid soap. You can apply CHG directly to the skin and wash gently with a scrungie or a clean washcloth.   Apply the CHG Soap to your body ONLY FROM THE NECK DOWN.  Do not use on open wounds or open sores. Avoid contact with your eyes, ears, mouth and genitals (private parts). Wash Face and genitals (private parts)  with your normal soap.   Wash thoroughly, paying special attention to the area where your surgery will be performed.  Thoroughly rinse your body with warm water from the neck down.  DO NOT shower/wash with your normal soap after using and rinsing off the CHG Soap.  Pat yourself dry with a CLEAN TOWEL.  Wear CLEAN PAJAMAS to bed the night before surgery  Place CLEAN SHEETS on your bed the night before your surgery  DO NOT SLEEP WITH PETS.   Day of Surgery: Take a shower with CHG soap. Do not wear jewelry or makeup Do not wear lotions, powders, perfumes/colognes, or deodorant. Do not shave 48 hours prior to surgery.  Men may shave face and neck. Do not bring valuables to the hospital.  [facility] is not responsible for any belongings or valuables. Do not wear nail polish, gel polish, artificial nails, or any other type of covering on natural nails (fingers and toes) If you have artificial nails or gel coating that need to be removed by a nail salon, please have this removed prior to surgery. Artificial nails or gel coating may interfere with anesthesia's ability to adequately monitor your vital signs. Wear Clean/Comfortable clothing the morning of surgery Remember to brush your teeth WITH YOUR REGULAR TOOTHPASTE.   Please read over  the following fact sheets that you were given.    If you received a COVID test during your pre-op visit  it is requested that you wear a mask when out in public, stay away from anyone that may not be feeling well and notify your surgeon if you develop symptoms. If you have been in contact with anyone that has tested positive in the last 10 days please notify you surgeon.

## 2024-02-08 NOTE — Progress Notes (Signed)
 PCP -Summer Epley, FNP  Cardiologist - Summer Sos, MD Nephrologist- Summer Donalds, MD  PPM/ICD - denies Device Orders - n/a Rep Notified - n/a  Chest x-ray - 07/25/2023 EKG - 07/25/2023 Stress Test - denies ECHO - 05/16/2023 Cardiac Cath - denies  Sleep Study - denies CPAP -n/a   Fasting Blood Sugar - no DM Checks Blood Sugar _____ times a day  Last dose of GLP1 agonist-  n/a GLP1 instructions: n/a  Per pt dr. Zelda Chavez put Farxiga  on hold. Pt is aware if she will have to start taking Farxiga  before her surgery, she has to hold it for 72 hrs; last dose will be June 27th.   Blood Thinner Instructions: n/a Aspirin  Instructions: follow surgeon's insrustions on when to stop Aspirin .  ERAS Protcol - yes, till 0430 AM PRE-SURGERY Ensure or G2- yes, Ensure  COVID TEST- n/a    Anesthesia review: yes. Hx of CHF, HTN, CKD. Last note from cardiologist - 11/14/2023.    Patient denies shortness of breath, fever, cough and chest pain at PAT appointment   All instructions explained to the patient, with a verbal understanding of the material. Patient agrees to go over the instructions while at home for a better understanding. Patient also instructed to self quarantine after being tested for COVID-19. The opportunity to ask questions was provided.

## 2024-02-11 ENCOUNTER — Other Ambulatory Visit: Payer: Self-pay | Admitting: Nurse Practitioner

## 2024-02-11 ENCOUNTER — Telehealth: Payer: Self-pay | Admitting: *Deleted

## 2024-02-11 NOTE — Telephone Encounter (Signed)
   Pre-operative Risk Assessment    Patient Name: [name]  DOB: 03-31-1961 MRN: [id number]   Date of last office visit: 11/13/2023 Date of next office visit: 03/11/2024   Request for Surgical Clearance    Procedure:  RIGHT ANKLE DEEP ORTHOPEDIC HARDWARE REMOVAL. ANKLE ARTHRODESIS  Date of Surgery:  Clearance 02/19/24                                Surgeon:  MEDFORD PAE, MD Surgeon's Group or Practice Name:  [facility] Phone number:  [phone] Fax number:  [phone]   Type of Clearance Requested:   - Medical  - Pharmacy:  Hold Aspirin      Type of Anesthesia:  General    Additional requests/questions:    SignedMemory Nest   02/11/2024, 2:10 PM

## 2024-02-11 NOTE — Progress Notes (Signed)
 Case: [id number] Date/Time: 02/19/24 0715   Procedures:      REMOVAL, HARDWARE (Right)     ARTHRODESIS ANKLE (Right: Ankle)   Anesthesia type: General   Diagnosis:      Osteoarthritis of right ankle, unspecified osteoarthritis type [M19.071]     Painful orthopaedic hardware (HCC) [T84.84XA]   Pre-op diagnosis: RIGHT ANKLE OSTEOARTHRITIS, RIGHT ANKLE PAINFUL ORTHOPEDIC HARDWARE   [facility] RO[facility] 12 / [facility]   Surgeons: Elsa Lonni SAUNDERS, MD       DISCUSSION: [name] is a 63 yo female who presents to PAT prior to surgery above. PMH of current smoking HTN, HFpEF, severe LVH, HLD, hx of hyperaldosteronism 2/2 adrenal adenoma, GERD, CKD stage 4, chronic anemia, hx of substance abuse (ETOH and cocaine)  Patient with hx of difficult airway. Glidescope used for 05/08/23 surgery  Pt follows with Cardiology for hx of HFpEF and LVH. Last CHF exacerbation was in 07/2023. Last seen by Dr. Raford on 11/14/23. BP noted to be controlled at that visit (although high at PAT visit). She was advised to f/u in 4 months.  Pt followed by Nephrology. Last seen on 12/18/23. There has been discussion about access options in the future. BP was noted to be uncontrolled with adherence issues. Noted to be volume up. Lasix  dose increased to BID  VS: BP (!) 168/78   Pulse 69   Temp 37 C   Resp 17   Ht 5' 8 (1.727 m)   Wt 107.5 kg   SpO2 100%   BMI 36.02 kg/m   PROVIDERS: Georgina Speaks, FNP [facility], Dr. Dennise  Cardiology: Annabella Raford, MD  LABS: Labs reviewed: Acceptable for surgery. CKD and anemia stable (all labs ordered are listed, but only abnormal results are displayed)  Labs Reviewed  COMPREHENSIVE METABOLIC PANEL WITH GFR - Abnormal; Notable for the following components:      Result Value   BUN 29 (*)    Creatinine, Ser 2.09 (*)    Calcium  8.6 (*)    GFR, Estimated 26 (*)    All other components within normal limits  CBC - Abnormal; Notable for the  following components:   Hemoglobin 10.9 (*)    MCHC 29.0 (*)    All other components within normal limits  SURGICAL PCR SCREEN     IMAGES:   EKG:   CV:  Past Medical History:  Diagnosis Date   (HFpEF) heart failure with preserved ejection fraction (HCC) 05/15/2023   Adrenal adenoma 01/11/2021   Anemia    CHF (congestive heart failure) (HCC)    Chronic bronchitis (HCC)    probably once/yr (05/12/2013)   Chronic kidney disease    Diastolic heart failure    Dyslipidemia    pt denies this hx on 05/12/2013   Exertional shortness of breath    GERD (gastroesophageal reflux disease)    Heart murmur    Hypertension    Hypokalemia    Left ventricular hypertrophy 01/11/2021   Primary hypertension 05/12/2013   Venous stasis ulcers (HCC)     Past Surgical History:  Procedure Laterality Date   BIOPSY  02/23/2023   Procedure: BIOPSY;  Surgeon: Wilhelmenia Aloha Raddle., MD;  Location: [facility] ENDOSCOPY;  Service: Gastroenterology;;   COLONOSCOPY N/A 02/23/2023   Procedure: COLONOSCOPY;  Surgeon: Wilhelmenia Aloha Raddle., MD;  Location[facility] ENDOSCOPY;  Service: Gastroenterology;  Laterality: N/A;   ESOPHAGOGASTRODUODENOSCOPY N/A 02/23/2023   Procedure: ESOPHAGOGASTRODUODENOSCOPY (EGD);  Surgeon: Wilhelmenia Aloha Raddle., MD;  Location: [facility] ENDOSCOPY;  Service: Gastroenterology;  Laterality: N/A;   HARDWARE REMOVAL Right 05/08/2023   Procedure: HARDWARE REMOVAL MEDIAL AND LATERAL;  Surgeon: Elsa Lonni SAUNDERS, MD;  Location: [facility] ORS;  Service: Orthopedics;  Laterality: Right;   HOT HEMOSTASIS N/A 02/23/2023   Procedure: HOT HEMOSTASIS (ARGON PLASMA COAGULATION/BICAP);  Surgeon: Wilhelmenia Aloha Raddle., MD;  Location: [facility] ENDOSCOPY;  Service: Gastroenterology;  Laterality: N/A;   IR US  GUIDE VASC ACCESS RIGHT  02/27/2017   IR VENOGRAM ADRENAL BI  02/27/2017   IR VENOGRAM HEPATIC WO HEMODYNAMIC EVALUATION  02/27/2017   IR VENOGRAM RENAL BI  02/27/2017   IR VENOUS SAMPLING  02/27/2017   IR VENOUS SAMPLING   02/27/2017   ORIF ANKLE FRACTURE Right 04/18/2023   Procedure: OPEN REDUCTION INTERNAL FIXATION (ORIF) ANKLE FRACTURE;  Surgeon: Edna Toribio LABOR, MD;  Location: [facility] ORS;  Service: Orthopedics;  Laterality: Right;   ORIF ANKLE FRACTURE Right 05/08/2023   Procedure: REVISION OPEN REDUCTION INTERNAL FIXATION (ORIF) OF RIGHT TRIMALLEOLAR ANKLE FRACTURE, OPEN TREATMENT OF SYNDESMOSIS;  Surgeon: Elsa Lonni SAUNDERS, MD;  Location: [facility] ORS;  Service: Orthopedics;  Laterality: Right;   POLYPECTOMY  02/23/2023   Procedure: POLYPECTOMY;  Surgeon: Wilhelmenia Aloha Raddle., MD;  Location: [facility] ENDOSCOPY;  Service: Gastroenterology;;   SYNDESMOSIS REPAIR Right 05/08/2023   Procedure: SYNDESMOSIS REPAIR;  Surgeon: Elsa Lonni SAUNDERS, MD;  Location: [facility] ORS;  Service: Orthopedics;  Laterality: Right;   VAGINAL HYSTERECTOMY  1990's    MEDICATIONS:  acetaminophen  (TYLENOL ) 325 MG tablet   amLODipine  (NORVASC ) 10 MG tablet   aspirin  EC 81 MG tablet   atorvastatin  (LIPITOR) 80 MG tablet   carvedilol  (COREG ) 12.5 MG tablet   cetirizine (ZYRTEC) 10 MG tablet   FARXIGA  10 MG TABS tablet   ferrous gluconate  (FERGON) 324 MG tablet   furosemide  (LASIX ) 40 MG tablet   hydrALAZINE  (APRESOLINE ) 50 MG tablet   pantoprazole  (PROTONIX ) 40 MG tablet   polyethylene glycol (MIRALAX  / GLYCOLAX ) 17 g packet   spironolactone  (ALDACTONE ) 25 MG tablet   No current facility-administered medications for this encounter.

## 2024-02-11 NOTE — Telephone Encounter (Signed)
   Name: [name]  DOB: [birth year]  MRN: [id number]  Primary Cardiologist: Annabella Scarce, MD   Preoperative team, please contact this patient and set up a phone call appointment for further preoperative risk assessment. Please obtain consent and complete medication review. Thank you for your help.  I confirm that guidance regarding antiplatelet and oral anticoagulation therapy has been completed and, if necessary, noted below.  Patient's aspirin  not prescribed by cardiology.  Recommendations for holding aspirin  will need to come from prescribing provider.  I also confirmed the patient resides in the state of [place] . As per [facility] Medical Board telemedicine laws, the patient must reside in the state in which the provider is licensed.   Josefa CHRISTELLA Beauvais, NP 02/11/2024, 4:12 PM [facility]

## 2024-02-11 NOTE — Telephone Encounter (Signed)
  Patient Consent for Virtual Visit        [name] has provided verbal consent on 02/11/2024 for a virtual visit (video or telephone).   CONSENT FOR VIRTUAL VISIT FOR:  [name]  By participating in this virtual visit I agree to the following:  I hereby voluntarily request, consent and authorize [facility] and its employed or contracted physicians, physician assistants, nurse practitioners or other licensed health care professionals (the Practitioner), to provide me with telemedicine health care services (the "Services) as deemed necessary by the treating Practitioner. I acknowledge and consent to receive the Services by the Practitioner via telemedicine. I understand that the telemedicine visit will involve communicating with the Practitioner through live audiovisual communication technology and the disclosure of certain medical information by electronic transmission. I acknowledge that I have been given the opportunity to request an in-person assessment or other available alternative prior to the telemedicine visit and am voluntarily participating in the telemedicine visit.  I understand that I have the right to withhold or withdraw my consent to the use of telemedicine in the course of my care at any time, without affecting my right to future care or treatment, and that the Practitioner or I may terminate the telemedicine visit at any time. I understand that I have the right to inspect all information obtained and/or recorded in the course of the telemedicine visit and may receive copies of available information for a reasonable fee.  I understand that some of the potential risks of receiving the Services via telemedicine include:  Delay or interruption in medical evaluation due to technological equipment failure or disruption; Information transmitted may not be sufficient (e.g. poor resolution of images) to allow for appropriate medical decision making by the  Practitioner; and/or  In rare instances, security protocols could fail, causing a breach of personal health information.  Furthermore, I acknowledge that it is my responsibility to provide information about my medical history, conditions and care that is complete and accurate to the best of my ability. I acknowledge that Practitioner's advice, recommendations, and/or decision may be based on factors not within their control, such as incomplete or inaccurate data provided by me or distortions of diagnostic images or specimens that may result from electronic transmissions. I understand that the practice of medicine is not an exact science and that Practitioner makes no warranties or guarantees regarding treatment outcomes. I acknowledge that a copy of this consent can be made available to me via my patient portal [facility] MyChart), or I can request a printed copy by calling the office of [facility].    I understand that my insurance will be billed for this visit.   I have read or had this consent read to me. I understand the contents of this consent, which adequately explains the benefits and risks of the Services being provided via telemedicine.  I have been provided ample opportunity to ask questions regarding this consent and the Services and have had my questions answered to my satisfaction. I give my informed consent for the services to be provided through the use of telemedicine in my medical care

## 2024-02-12 ENCOUNTER — Ambulatory Visit (INDEPENDENT_AMBULATORY_CARE_PROVIDER_SITE_OTHER)

## 2024-02-12 DIAGNOSIS — Z01810 Encounter for preprocedural cardiovascular examination: Secondary | ICD-10-CM

## 2024-02-12 NOTE — Progress Notes (Signed)
 Virtual Visit via Telephone Note   Because of [name] co-morbid illnesses, she is at least at moderate risk for complications without adequate follow up.  This format is felt to be most appropriate for this patient at this time.  Due to technical limitations with video connection (technology), today's appointment will be conducted as an audio only telehealth visit, and [name] verbally agreed to proceed in this manner.   All issues noted in this document were discussed and addressed.  No physical exam could be performed with this format.  Evaluation Performed:  Preoperative cardiovascular risk assessment _____________   Date:  02/12/2024   Patient ID:  [name], DOB 1960/10/06, MRN [id number] Patient Location:  Home Provider location:   Office  Primary Care Provider:  Georgina Speaks, FNP Primary Cardiologist:  Annabella Scarce, MD  Chief Complaint / Patient Profile   63 y.o. y/o female with a h/o hypertension, CKD stage III, tobacco abuse, cardiac murmur who is pending right ankle deep orthopedic hardware removal.  Ankle arthrodesis and presents today for telephonic preoperative cardiovascular risk assessment.  History of Present Illness    [name] is a 63 y.o. female who presents via audio/video conferencing for a telehealth visit today.  Pt was last seen in cardiology clinic on 11/14/2023 by Dr. Scarce.  At that time [name] was doing well .  The patient is now pending procedure as outlined above. Since her last visit, she remained stable from a cardiac standpoint.  Today she denies chest pain, shortness of breath, lower extremity edema, fatigue, palpitations, melena, hematuria, hemoptysis, diaphoresis, weakness, presyncope, syncope, orthopnea, and PND.   Past Medical History    Past Medical History:  Diagnosis Date   (HFpEF) heart failure with preserved ejection fraction (HCC) 05/15/2023   Adrenal adenoma 01/11/2021   Anemia    CHF  (congestive heart failure) (HCC)    Chronic bronchitis (HCC)    probably once/yr (05/12/2013)   Chronic kidney disease    Diastolic heart failure    Dyslipidemia    pt denies this hx on 05/12/2013   Exertional shortness of breath    GERD (gastroesophageal reflux disease)    Heart murmur    Hypertension    Hypokalemia    Left ventricular hypertrophy 01/11/2021   Primary hypertension 05/12/2013   Venous stasis ulcers (HCC)    Past Surgical History:  Procedure Laterality Date   BIOPSY  02/23/2023   Procedure: BIOPSY;  Surgeon: Wilhelmenia Aloha Raddle., MD;  Location: [facility] ENDOSCOPY;  Service: Gastroenterology;;   COLONOSCOPY N/A 02/23/2023   Procedure: COLONOSCOPY;  Surgeon: Wilhelmenia Aloha Raddle., MD;  Location: [facility] ENDOSCOPY;  Service: Gastroenterology;  Laterality: N/A;   ESOPHAGOGASTRODUODENOSCOPY N/A 02/23/2023   Procedure: ESOPHAGOGASTRODUODENOSCOPY (EGD);  Surgeon: Wilhelmenia Aloha Raddle., MD;  Location: [facility] ENDOSCOPY;  Service: Gastroenterology;  Laterality: N/A;   HARDWARE REMOVAL Right 05/08/2023   Procedure: HARDWARE REMOVAL MEDIAL AND LATERAL;  Surgeon: Elsa Lonni SAUNDERS, MD;  Location: [facility] ORS;  Service: Orthopedics;  Laterality: Right;   HOT HEMOSTASIS N/A 02/23/2023   Procedure: HOT HEMOSTASIS (ARGON PLASMA COAGULATION/BICAP);  Surgeon: Wilhelmenia Aloha Raddle., MD;  Location: [facility] ENDOSCOPY;  Service: Gastroenterology;  Laterality: N/A;   IR US  GUIDE VASC ACCESS RIGHT  02/27/2017   IR VENOGRAM ADRENAL BI  02/27/2017   IR VENOGRAM HEPATIC WO HEMODYNAMIC EVALUATION  02/27/2017   IR VENOGRAM RENAL BI  02/27/2017   IR VENOUS SAMPLING  02/27/2017   IR VENOUS SAMPLING  02/27/2017   ORIF ANKLE FRACTURE Right  04/18/2023   Procedure: OPEN REDUCTION INTERNAL FIXATION (ORIF) ANKLE FRACTURE;  Surgeon: Edna Toribio LABOR, MD;  Location: [facility]S;  Service: Orthopedics;  Laterality: Right;   ORIF ANKLE FRACTURE Right 05/08/2023   Procedure: REVISION OPEN REDUCTION INTERNAL FIXATION (ORIF) OF RIGHT  TRIMALLEOLAR ANKLE FRACTURE, OPEN TREATMENT OF SYNDESMOSIS;  Surgeon: Elsa Lonni SAUNDERS, MD;  Location: [facility] ORS;  Service: Orthopedics;  Laterality: Right;   POLYPECTOMY  02/23/2023   Procedure: POLYPECTOMY;  Surgeon: Wilhelmenia Aloha Raddle., MD;  Location: [facility] ENDOSCOPY;  Service: Gastroenterology;;   SYNDESMOSIS REPAIR Right 05/08/2023   Procedure: SYNDESMOSIS REPAIR;  Surgeon: Elsa Lonni SAUNDERS, MD;  Location: [facility]S;  Service: Orthopedics;  Laterality: Right;   VAGINAL HYSTERECTOMY  1990's    Allergies  Allergies  Allergen Reactions   Lisinopril  Cough    Home Medications    Prior to Admission medications   Medication Sig Start Date End Date Taking? Authorizing Provider  acetaminophen  (TYLENOL ) 325 MG tablet Take 650 mg by mouth every 6 (six) hours as needed for mild pain or headache.    [provider]  amLODipine  (NORVASC ) 10 MG tablet TAKE 1 TABLET(10 MG) BY MOUTH DAILY 12/04/23   Petrina Pries, NP  aspirin  EC 81 MG tablet Take 1 tablet (81 mg total) by mouth 2 (two) times daily. For blood clot prevention. Take for 30 days after surgery. Swallow whole. Patient not taking: Reported on 02/11/2024 05/08/23 05/07/24  [place], Lukasz Rogus J, PA-C  atorvastatin  (LIPITOR) 80 MG tablet Take 1 tablet (80 mg total) by mouth daily. 06/06/23   Raford Riggs, MD  carvedilol  (COREG ) 12.5 MG tablet Take 1 tablet (12.5 mg total) by mouth 2 (two) times daily with a meal. 06/06/23   Raford Riggs, MD  cetirizine (ZYRTEC) 10 MG tablet Take 10 mg by mouth daily as needed for allergies.    [provider]  FARXIGA  10 MG TABS tablet Take 1 tablet (10 mg total) by mouth daily. Patient not taking: Reported on 02/11/2024 03/07/23   Petrina Pries, NP  ferrous gluconate  (FERGON) 324 MG tablet Take 1 tablet (324 mg total) by mouth daily with breakfast. 11/21/23   Georgina Speaks, FNP  furosemide  (LASIX ) 40 MG tablet TAKE 1 TABLET(40 MG) BY MOUTH DAILY 02/11/24   Georgina Speaks, FNP  hydrALAZINE   (APRESOLINE ) 50 MG tablet Take 1 tablet (50 mg total) by mouth 3 (three) times daily. 10/09/23   Georgina Speaks, FNP  pantoprazole  (PROTONIX ) 40 MG tablet Take 40 mg by mouth daily. 09/27/23   [provider]  polyethylene glycol (MIRALAX  / GLYCOLAX ) 17 g packet Take 17 g by mouth daily as needed for mild constipation or moderate constipation.    [provider]  spironolactone  (ALDACTONE ) 25 MG tablet Take 1 tablet (25 mg total) by mouth daily. 09/06/23   Raford Riggs, MD    Physical Exam    Vital Signs:  [name] does not have vital signs available for review today.  Given telephonic nature of communication, physical exam is limited. AAOx3. NAD. Normal affect.  Speech and respirations are unlabored.  Accessory Clinical Findings    None  Assessment & Plan    1.  Preoperative Cardiovascular Risk Assessment:Procedure:  RIGHT ANKLE DEEP ORTHOPEDIC HARDWARE REMOVAL. ANKLE ARTHRODESIS   Date of Surgery:  Clearance 02/19/24                                  Surgeon:  MEDFORD ELSA,  MD Surgeon's Group or Practice Name:  [facility] Phone number:  [phone] Fax number:  [phone]      Primary Cardiologist: Annabella Scarce, MD  Chart reviewed as part of pre-operative protocol coverage. Given past medical history and time since last visit, based on ACC/AHA guidelines, [name] would be at acceptable risk for the planned procedure without further cardiovascular testing.   Her RCRI is low risk, 0.9% risk of major cardiac event.  She is able to complete greater than 4 METS of physical activity.  Patient was advised that if she develops new symptoms prior to surgery to contact our office to arrange a follow-up appointment.  He verbalized understanding.  Patient's aspirin  not prescribed by cardiology. Recommendations for holding aspirin  will need to come from prescribing provider.   I will route this recommendation to the requesting party via  Epic fax function and remove from pre-op pool.       Time:   Today, I have spent  5 minutes with the patient with telehealth technology discussing medical history, symptoms, and management plan. I spent 10 minutes reviewing patient's past cardiac history and cardiac medications.     Summer CHRISTELLA Beauvais, NP  02/12/2024, 7:35 AM

## 2024-02-12 NOTE — Anesthesia Preprocedure Evaluation (Addendum)
 Anesthesia Evaluation  Patient identified by MRN, date of birth, ID band Patient awake    Reviewed: Allergy & Precautions, NPO status , Patient's Chart, lab work & pertinent test results  History of Anesthesia Complications Negative for: history of anesthetic complications  Airway Mallampati: IV  TM Distance: >3 FB Neck ROM: Full    Dental  (+) Teeth Intact, Dental Advisory Given   Pulmonary neg shortness of breath, neg sleep apnea, neg COPD, neg recent URI, Current Smoker and Patient abstained from smoking.   breath sounds clear to auscultation       Cardiovascular hypertension, Pt. on medications and Pt. on home beta blockers (-) angina +CHF  (-) Past MI + Valvular Problems/Murmurs AI  Rhythm:Regular   1. Left ventricular ejection fraction, by estimation, is 60 to 65%. The  left ventricle has normal function. The left ventricle has no regional  wall motion abnormalities. There is severe concentric left ventricular  hypertrophy. Left ventricular diastolic   parameters are consistent with Grade I diastolic dysfunction (impaired  relaxation). Elevated left ventricular end-diastolic pressure.   2. Right ventricular systolic function is normal. The right ventricular  size is normal. Tricuspid regurgitation signal is inadequate for assessing  PA pressure.   3. Left atrial size was severely dilated.   4. Right atrial size was mildly dilated.   5. The mitral valve is normal in structure. No evidence of mitral valve  regurgitation. No evidence of mitral stenosis.   6. The aortic valve is tricuspid. Aortic valve regurgitation is mild.  Aortic valve sclerosis/calcification is present, without any evidence of  aortic stenosis.   7. The inferior vena cava is normal in size with greater than 50%  respiratory variability, suggesting right atrial pressure of 3 mmHg.      Neuro/Psych negative neurological ROS     GI/Hepatic Neg liver  ROS,GERD  ,,  Endo/Other  negative endocrine ROS    Renal/GU CRFRenal diseaseLab Results      Component                Value               Date                      NA                       138                 02/08/2024                K                        4.5                 02/08/2024                CO2                      25                  02/08/2024                GLUCOSE                  85                  02/08/2024  BUN                      29 (H)              02/08/2024                CREATININE               2.09 (H)            02/08/2024                CALCIUM                   8.6 (L)             02/08/2024                EGFR                     22.0                12/19/2023                GFRNONAA                 26 (L)              02/08/2024                Musculoskeletal negative musculoskeletal ROS (+)    Abdominal   Peds  Hematology  (+) Blood dyscrasia, anemia Lab Results      Component                Value               Date                      WBC                      7.3                 02/08/2024                HGB                      10.9 (L)            02/08/2024                HCT                      37.6                02/08/2024                MCV                      95.9                02/08/2024                PLT                      282                 02/08/2024              Anesthesia Other Findings   Reproductive/Obstetrics  Anesthesia Physical Anesthesia Plan  ASA: 3  Anesthesia Plan: General and Regional   Post-op Pain Management: Regional block* and Ofirmev  IV (intra-op)*   Induction: Intravenous  PONV Risk Score and Plan: 2 and Ondansetron  and Dexamethasone   Airway Management Planned: LMA and Oral ETT  Additional Equipment: None  Intra-op Plan:   Post-operative Plan: Extubation in OR  Informed Consent: I have reviewed the patients History and  Physical, chart, labs and discussed the procedure including the risks, benefits and alternatives for the proposed anesthesia with the patient or authorized representative who has indicated his/her understanding and acceptance.     Dental advisory given  Plan Discussed with: CRNA  Anesthesia Plan Comments: (See PAT note from 6/20)         Anesthesia Quick Evaluation

## 2024-02-12 NOTE — Telephone Encounter (Signed)
 2nd preop clearance request was received. Will route notes to the surgeons office.

## 2024-02-19 ENCOUNTER — Ambulatory Visit (HOSPITAL_BASED_OUTPATIENT_CLINIC_OR_DEPARTMENT_OTHER): Payer: Self-pay | Admitting: Anesthesiology

## 2024-02-19 ENCOUNTER — Ambulatory Visit (HOSPITAL_COMMUNITY)
Admission: RE | Admit: 2024-02-19 | Discharge: 2024-02-19 | Disposition: A | Discharge status: Home / Self Care | Source: Ambulatory Visit | Attending: Orthopaedic Surgery | Admitting: Orthopaedic Surgery

## 2024-02-19 ENCOUNTER — Other Ambulatory Visit: Payer: Self-pay

## 2024-02-19 ENCOUNTER — Encounter (HOSPITAL_COMMUNITY)
Admission: RE | Disposition: A | Discharge status: Home / Self Care | Payer: Self-pay | Source: Ambulatory Visit | Attending: Orthopaedic Surgery

## 2024-02-19 ENCOUNTER — Ambulatory Visit (HOSPITAL_COMMUNITY)

## 2024-02-19 ENCOUNTER — Ambulatory Visit (HOSPITAL_COMMUNITY): Payer: Self-pay | Admitting: Medical

## 2024-02-19 ENCOUNTER — Telehealth

## 2024-02-19 ENCOUNTER — Other Ambulatory Visit (HOSPITAL_COMMUNITY): Payer: Self-pay

## 2024-02-19 ENCOUNTER — Encounter (HOSPITAL_COMMUNITY): Payer: Self-pay | Admitting: Orthopaedic Surgery

## 2024-02-19 DIAGNOSIS — K219 Gastro-esophageal reflux disease without esophagitis: Secondary | ICD-10-CM | POA: Diagnosis not present

## 2024-02-19 DIAGNOSIS — F17210 Nicotine dependence, cigarettes, uncomplicated: Secondary | ICD-10-CM | POA: Diagnosis not present

## 2024-02-19 DIAGNOSIS — T8484XA Pain due to internal orthopedic prosthetic devices, implants and grafts, initial encounter: Secondary | ICD-10-CM

## 2024-02-19 DIAGNOSIS — I130 Hypertensive heart and chronic kidney disease with heart failure and stage 1 through stage 4 chronic kidney disease, or unspecified chronic kidney disease: Secondary | ICD-10-CM | POA: Insufficient documentation

## 2024-02-19 DIAGNOSIS — M19171 Post-traumatic osteoarthritis, right ankle and foot: Secondary | ICD-10-CM | POA: Diagnosis not present

## 2024-02-19 DIAGNOSIS — I5032 Chronic diastolic (congestive) heart failure: Secondary | ICD-10-CM | POA: Diagnosis not present

## 2024-02-19 DIAGNOSIS — Z5986 Financial insecurity: Secondary | ICD-10-CM | POA: Diagnosis not present

## 2024-02-19 DIAGNOSIS — X58XXXA Exposure to other specified factors, initial encounter: Secondary | ICD-10-CM | POA: Diagnosis not present

## 2024-02-19 DIAGNOSIS — I110 Hypertensive heart disease with heart failure: Secondary | ICD-10-CM | POA: Diagnosis not present

## 2024-02-19 DIAGNOSIS — I5033 Acute on chronic diastolic (congestive) heart failure: Secondary | ICD-10-CM | POA: Diagnosis not present

## 2024-02-19 DIAGNOSIS — N189 Chronic kidney disease, unspecified: Secondary | ICD-10-CM | POA: Insufficient documentation

## 2024-02-19 DIAGNOSIS — M19071 Primary osteoarthritis, right ankle and foot: Secondary | ICD-10-CM | POA: Diagnosis not present

## 2024-02-19 SURGERY — REMOVAL, HARDWARE
Anesthesia: Regional | Site: Ankle | Laterality: Right

## 2024-02-19 MED ORDER — EPHEDRINE 5 MG/ML INJ
INTRAVENOUS | Status: AC
  Filled 2024-02-19: qty 5

## 2024-02-19 MED ORDER — MIDAZOLAM HCL 2 MG/2ML IJ SOLN
INTRAMUSCULAR | Status: DC | PRN
  Administered 2024-02-19: 2 mg via INTRAVENOUS

## 2024-02-19 MED ORDER — CEFAZOLIN SODIUM-DEXTROSE 2-4 GM/100ML-% IV SOLN
2.0000 g | INTRAVENOUS | Status: AC
  Administered 2024-02-19: 2 g via INTRAVENOUS
  Filled 2024-02-19: qty 100

## 2024-02-19 MED ORDER — MIDAZOLAM HCL 2 MG/2ML IJ SOLN
INTRAMUSCULAR | Status: AC
  Filled 2024-02-19: qty 2

## 2024-02-19 MED ORDER — FENTANYL CITRATE (PF) 250 MCG/5ML IJ SOLN
INTRAMUSCULAR | Status: AC
  Filled 2024-02-19: qty 5

## 2024-02-19 MED ORDER — ROCURONIUM BROMIDE 10 MG/ML (PF) SYRINGE
PREFILLED_SYRINGE | INTRAVENOUS | Status: AC
  Filled 2024-02-19: qty 10

## 2024-02-19 MED ORDER — LIDOCAINE 2% (20 MG/ML) 5 ML SYRINGE
INTRAMUSCULAR | Status: AC
  Filled 2024-02-19: qty 5

## 2024-02-19 MED ORDER — ONDANSETRON HCL 4 MG/2ML IJ SOLN
INTRAMUSCULAR | Status: DC | PRN
  Administered 2024-02-19: 4 mg via INTRAVENOUS

## 2024-02-19 MED ORDER — ORAL CARE MOUTH RINSE: 15.0000 mL | Freq: Once | OROMUCOSAL | Status: AC

## 2024-02-19 MED ORDER — POVIDONE-IODINE 10 % EX SWAB
2.0000 | Freq: Once | CUTANEOUS | Status: AC
  Administered 2024-02-19: 2 via TOPICAL

## 2024-02-19 MED ORDER — EPHEDRINE SULFATE-NACL 50-0.9 MG/10ML-% IV SOSY
PREFILLED_SYRINGE | INTRAVENOUS | Status: DC | PRN
  Administered 2024-02-19 (×2): 5 mg via INTRAVENOUS

## 2024-02-19 MED ORDER — PROPOFOL 10 MG/ML IV BOLUS
INTRAVENOUS | Status: AC
  Filled 2024-02-19: qty 20

## 2024-02-19 MED ORDER — PHENYLEPHRINE 80 MCG/ML (10ML) SYRINGE FOR IV PUSH (FOR BLOOD PRESSURE SUPPORT)
PREFILLED_SYRINGE | INTRAVENOUS | Status: AC
Start: 2024-02-19 — End: 2024-02-19
  Filled 2024-02-19: qty 10

## 2024-02-19 MED ORDER — DEXAMETHASONE SODIUM PHOSPHATE 10 MG/ML IJ SOLN
INTRAMUSCULAR | Status: DC | PRN
  Administered 2024-02-19: 5 mg via INTRAVENOUS

## 2024-02-19 MED ORDER — CHLORHEXIDINE GLUCONATE 0.12 % MT SOLN
15.0000 mL | Freq: Once | OROMUCOSAL | Status: AC
  Administered 2024-02-19: 15 mL via OROMUCOSAL
  Filled 2024-02-19: qty 15

## 2024-02-19 MED ORDER — LACTATED RINGERS IV SOLN
INTRAVENOUS | Status: DC | PRN
Start: 2024-02-19 — End: 2024-02-19

## 2024-02-19 MED ORDER — POVIDONE-IODINE 7.5 % EX SOLN
Freq: Once | CUTANEOUS | Status: DC
  Filled 2024-02-19: qty 118

## 2024-02-19 MED ORDER — OXYCODONE HCL 5 MG PO TABS
5.0000 mg | ORAL_TABLET | ORAL | 0 refills | Status: DC | PRN
  Filled 2024-02-19: qty 30, 5d supply, fill #0

## 2024-02-19 MED ORDER — FENTANYL CITRATE (PF) 250 MCG/5ML IJ SOLN
INTRAMUSCULAR | Status: DC | PRN
  Administered 2024-02-19 (×2): 50 ug via INTRAVENOUS
  Administered 2024-02-19: 25 ug via INTRAVENOUS
  Administered 2024-02-19: 50 ug via INTRAVENOUS
  Administered 2024-02-19: 75 ug via INTRAVENOUS

## 2024-02-19 MED ORDER — LACTATED RINGERS IV SOLN: INTRAVENOUS | Status: DC

## 2024-02-19 MED ORDER — OXYCODONE HCL 5 MG PO TABS: 5.0000 mg | ORAL_TABLET | Freq: Once | ORAL | Status: DC | PRN

## 2024-02-19 MED ORDER — ACETAMINOPHEN 10 MG/ML IV SOLN: 1000.0000 mg | Freq: Once | INTRAVENOUS | Status: DC | PRN

## 2024-02-19 MED ORDER — 0.9 % SODIUM CHLORIDE (POUR BTL) OPTIME
TOPICAL | Status: DC | PRN
  Administered 2024-02-19: 1000 mL

## 2024-02-19 MED ORDER — SUGAMMADEX SODIUM 200 MG/2ML IV SOLN
INTRAVENOUS | Status: DC | PRN
  Administered 2024-02-19: 200 mg via INTRAVENOUS

## 2024-02-19 MED ORDER — ROCURONIUM BROMIDE 10 MG/ML (PF) SYRINGE
PREFILLED_SYRINGE | INTRAVENOUS | Status: DC | PRN
  Administered 2024-02-19: 70 mg via INTRAVENOUS

## 2024-02-19 MED ORDER — OXYCODONE HCL 5 MG/5ML PO SOLN: 5.0000 mg | Freq: Once | ORAL | Status: DC | PRN

## 2024-02-19 MED ORDER — ONDANSETRON HCL 4 MG/2ML IJ SOLN
INTRAMUSCULAR | Status: AC
  Filled 2024-02-19: qty 2

## 2024-02-19 MED ORDER — ALBUTEROL SULFATE HFA 108 (90 BASE) MCG/ACT IN AERS
INHALATION_SPRAY | RESPIRATORY_TRACT | Status: DC | PRN
  Administered 2024-02-19: 4 via RESPIRATORY_TRACT

## 2024-02-19 MED ORDER — FENTANYL CITRATE (PF) 100 MCG/2ML IJ SOLN: 25.0000 ug | INTRAMUSCULAR | Status: DC | PRN

## 2024-02-19 MED ORDER — PROPOFOL 10 MG/ML IV BOLUS
INTRAVENOUS | Status: DC | PRN
  Administered 2024-02-19: 200 mg via INTRAVENOUS

## 2024-02-19 MED ORDER — DEXAMETHASONE SODIUM PHOSPHATE 10 MG/ML IJ SOLN
INTRAMUSCULAR | Status: AC
Start: 2024-02-19 — End: 2024-02-19
  Filled 2024-02-19: qty 1

## 2024-02-19 MED ORDER — ASPIRIN 325 MG PO TABS
ORAL_TABLET | ORAL | 0 refills | Status: DC
  Filled 2024-02-19: qty 30, 30d supply, fill #0

## 2024-02-19 SURGICAL SUPPLY — 76 items
ALLOGRAFT ARTHROCELL PLUS 5 (Bone Implant) IMPLANT
BAG COUNTER SPONGE SURGICOUNT (BAG) ×2 IMPLANT
BANDAGE ESMARK 6X9 LF (GAUZE/BANDAGES/DRESSINGS) IMPLANT
BENZOIN TINCTURE PRP APPL 2/3 (GAUZE/BANDAGES/DRESSINGS) IMPLANT
BIT DRILL 2.5 CANN LNG (BIT) IMPLANT
BIT DRILL 2.5 CANN STRL (BIT) IMPLANT
BIT DRILL 4 CANN (BIT) IMPLANT
BIT DRILL CANN LNG FLUTE 3.0 (BIT) IMPLANT
BIT DRILL SOLID LONG 5.5 (BIT) IMPLANT
BLADE SURG 15 STRL LF DISP TIS (BLADE) ×4 IMPLANT
BNDG COMPR ESMARK 4X3 LF (GAUZE/BANDAGES/DRESSINGS) IMPLANT
BNDG ELASTIC 4X5.8 VLCR STR LF (GAUZE/BANDAGES/DRESSINGS) IMPLANT
BNDG ELASTIC 6INX 5YD STR LF (GAUZE/BANDAGES/DRESSINGS) IMPLANT
BNDG ELASTIC 6X10 VLCR STRL LF (GAUZE/BANDAGES/DRESSINGS) ×2 IMPLANT
CHIPS CANC BONE 20CC PCAN1/4 (Bone Implant) ×1 IMPLANT
CHLORAPREP W/TINT 26 (MISCELLANEOUS) ×2 IMPLANT
CUFF TRNQT CYL 34X4.125X (TOURNIQUET CUFF) ×2 IMPLANT
DRAPE C-ARM 42X120 X-RAY (DRAPES) IMPLANT
DRAPE C-ARMOR (DRAPES) ×2 IMPLANT
DRAPE IMP U-DRAPE 54X76 (DRAPES) ×2 IMPLANT
DRAPE OEC MINIVIEW 54X84 (DRAPES) ×2 IMPLANT
DRAPE U-SHAPE 47X51 STRL (DRAPES) ×2 IMPLANT
DRSG XEROFORM 1X8 (GAUZE/BANDAGES/DRESSINGS) ×2 IMPLANT
ELECTRODE REM PT RTRN 9FT ADLT (ELECTROSURGICAL) ×2 IMPLANT
GAUZE PAD ABD 8X10 STRL (GAUZE/BANDAGES/DRESSINGS) IMPLANT
GAUZE SPONGE 4X4 12PLY STRL (GAUZE/BANDAGES/DRESSINGS) ×2 IMPLANT
GAUZE SPONGE 4X4 12PLY STRL LF (GAUZE/BANDAGES/DRESSINGS) ×2 IMPLANT
GAUZE XEROFORM 1X8 LF (GAUZE/BANDAGES/DRESSINGS) ×2 IMPLANT
GLOVE BIOGEL M STRL SZ7.5 (GLOVE) ×4 IMPLANT
GLOVE BIOGEL PI IND STRL 8 (GLOVE) ×4 IMPLANT
GOWN STRL REUS W/ TWL LRG LVL3 (GOWN DISPOSABLE) ×2 IMPLANT
GOWN STRL REUS W/ TWL XL LVL3 (GOWN DISPOSABLE) ×2 IMPLANT
GRAFT BNE CANC CHIPS 1-8 20CC (Bone Implant) IMPLANT
GUIDEWIRE W/TROCAR TIP .094X8 (WIRE) IMPLANT
INSTRUMENT LRG BB TAK DISP (EXFIX) IMPLANT
KIT BASIN OR (CUSTOM PROCEDURE TRAY) ×2 IMPLANT
NDL 18GX1X1/2 (RX/OR ONLY) (NEEDLE) ×2 IMPLANT
NDL HYPO 25X1 1.5 SAFETY (NEEDLE) IMPLANT
NEEDLE 18GX1X1/2 (RX/OR ONLY) (NEEDLE) IMPLANT
NEEDLE HYPO 25X1 1.5 SAFETY (NEEDLE) IMPLANT
NS IRRIG 1000ML POUR BTL (IV SOLUTION) ×2 IMPLANT
PACK ORTHO EXTREMITY (CUSTOM PROCEDURE TRAY) ×2 IMPLANT
PAD CAST 4YDX4 CTTN HI CHSV (CAST SUPPLIES) ×2 IMPLANT
PADDING CAST SYNTHETIC 4X4 STR (CAST SUPPLIES) IMPLANT
PLATE ANKLE FUSION TT RT (Plate) IMPLANT
SCREW BONE TI LP 4.5X30 (Screw) IMPLANT
SCREW BONE TI LP 5.5X50 (Screw) IMPLANT
SCREW CNTRSNK CANN FIXED 6.7 (MISCELLANEOUS) IMPLANT
SCREW LOCK 40X4.5X ANKL TI (Screw) IMPLANT
SCREW LOCK LOW PRO 4.5X26 (Screw) IMPLANT
SCREW LOCK LOW PRO 4.5X32 (Screw) IMPLANT
SCREW LOCK LOW PROFILE 4.5X50 (Screw) IMPLANT
SCREW LOCK LP 4.5X30 (Screw) IMPLANT
SCREW LOW PRO CANN 6.7X60 (Screw) IMPLANT
SCREW LOW PRO TI 4.5X34 (Screw) IMPLANT
SHEET MEDIUM DRAPE 40X70 STRL (DRAPES) ×2 IMPLANT
SLEEVE SCD COMPRESS KNEE MED (STOCKING) ×2 IMPLANT
SPIKE FLUID TRANSFER (MISCELLANEOUS) ×2 IMPLANT
SPLINT PLASTER CAST XFAST 5X30 (CAST SUPPLIES) IMPLANT
SPONGE T-LAP 18X18 ~~LOC~~+RFID (SPONGE) ×2 IMPLANT
STOCKINETTE 6 STRL (DRAPES) ×2 IMPLANT
STRIP CLOSURE SKIN 1/2X4 (GAUZE/BANDAGES/DRESSINGS) ×2 IMPLANT
SUCTION TUBE FRAZIER 10FR DISP (SUCTIONS) IMPLANT
SUT ETHILON 2 0 PSLX (SUTURE) IMPLANT
SUT ETHILON 3 0 PS 1 (SUTURE) ×2 IMPLANT
SUT MNCRL AB 3-0 PS2 18 (SUTURE) ×2 IMPLANT
SUT PDS AB 2-0 CT2 27 (SUTURE) ×2 IMPLANT
SUT VIC AB 2-0 CT1 TAPERPNT 27 (SUTURE) IMPLANT
SUT VIC AB 3-0 FS2 27 (SUTURE) ×2 IMPLANT
SYR 20ML LL LF (SYRINGE) ×2 IMPLANT
SYR BULB EAR ULCER 3OZ GRN STR (SYRINGE) ×2 IMPLANT
SYR CONTROL 10ML LL (SYRINGE) IMPLANT
TOWEL GREEN STERILE FF (TOWEL DISPOSABLE) ×4 IMPLANT
TUBE CONNECTING 20X1/4 (TUBING) ×2 IMPLANT
UNDERPAD 30X36 HEAVY ABSORB (UNDERPADS AND DIAPERS) ×2 IMPLANT
YANKAUER SUCT BULB TIP NO VENT (SUCTIONS) IMPLANT

## 2024-02-19 NOTE — H&P (Signed)
 PREOPERATIVE H&P  Chief Complaint: Right ankle pain  HPI: [name] is a 63 y.o. female who presents for preoperative history and physical with a diagnosis of right ankle osteoarthritis, posttraumatic with collapse and retained symptomatic orthopedic hardware she is here today for surgery.  She had open treatment of unstable ankle fracture that went on to failure and was revised.  After revision she had some AVN form of her tibia and some persistent collapse and valgus deformity.  She has pain and dysfunction from this.  She is here today for ankle arthrodesis and removal of hardware.. Symptoms are rated as moderate to severe, and have been worsening.  This is significantly impairing activities of daily living.  She has elected for surgical management.   Past Medical History:  Diagnosis Date   (HFpEF) heart failure with preserved ejection fraction (HCC) 05/15/2023   Adrenal adenoma 01/11/2021   Anemia    CHF (congestive heart failure) (HCC)    Chronic bronchitis (HCC)    probably once/yr (05/12/2013)   Chronic kidney disease    Diastolic heart failure    Dyslipidemia    pt denies this hx on 05/12/2013   Exertional shortness of breath    GERD (gastroesophageal reflux disease)    Heart murmur    Hypertension    Hypokalemia    Left ventricular hypertrophy 01/11/2021   Primary hypertension 05/12/2013   Venous stasis ulcers (HCC)    Past Surgical History:  Procedure Laterality Date   BIOPSY  02/23/2023   Procedure: BIOPSY;  Surgeon: Wilhelmenia Aloha Raddle., MD;  Location: [facility] ENDOSCOPY;  Service: Gastroenterology;;   COLONOSCOPY N/A 02/23/2023   Procedure: COLONOSCOPY;  Surgeon: Wilhelmenia Aloha Raddle., MD;  Location: [facility] ENDOSCOPY;  Service: Gastroenterology;  Laterality: N/A;   ESOPHAGOGASTRODUODENOSCOPY N/A 02/23/2023   Procedure: ESOPHAGOGASTRODUODENOSCOPY (EGD);  Surgeon: Wilhelmenia Aloha Raddle., MD;  Location: [facility] ENDOSCOPY;  Service: Gastroenterology;  Laterality: N/A;   HARDWARE  REMOVAL Right 05/08/2023   Procedure: HARDWARE REMOVAL MEDIAL AND LATERAL;  Surgeon: Elsa Lonni SAUNDERS, MD;  Location: [facility] ORS;  Service: Orthopedics;  Laterality: Right;   HOT HEMOSTASIS N/A 02/23/2023   Procedure: HOT HEMOSTASIS (ARGON PLASMA COAGULATION/BICAP);  Surgeon: Wilhelmenia Aloha Raddle., MD;  Location: [facility] ENDOSCOPY;  Service: Gastroenterology;  Laterality: N/A;   IR US  GUIDE VASC ACCESS RIGHT  02/27/2017   IR VENOGRAM ADRENAL BI  02/27/2017   IR VENOGRAM HEPATIC WO HEMODYNAMIC EVALUATION  02/27/2017   IR VENOGRAM RENAL BI  02/27/2017   IR VENOUS SAMPLING  02/27/2017   IR VENOUS SAMPLING  02/27/2017   ORIF ANKLE FRACTURE Right 04/18/2023   Procedure: OPEN REDUCTION INTERNAL FIXATION (ORIF) ANKLE FRACTURE;  Surgeon: Edna Toribio LABOR, MD;  Location: [facility] ORS;  Service: Orthopedics;  Laterality: Right;   ORIF ANKLE FRACTURE Right 05/08/2023   Procedure: REVISION OPEN REDUCTION INTERNAL FIXATION (ORIF) OF RIGHT TRIMALLEOLAR ANKLE FRACTURE, OPEN TREATMENT OF SYNDESMOSIS;  Surgeon: Elsa Lonni SAUNDERS, MD;  Location: [facility] ORS;  Service: Orthopedics;  Laterality: Right;   POLYPECTOMY  02/23/2023   Procedure: POLYPECTOMY;  Surgeon: Wilhelmenia Aloha Raddle., MD;  Location: [facility] ENDOSCOPY;  Service: Gastroenterology;;   SYNDESMOSIS REPAIR Right 05/08/2023   Procedure: SYNDESMOSIS REPAIR;  Surgeon: Elsa Lonni SAUNDERS, MD;  Location: [facility];  Service: Orthopedics;  Laterality: Right;   VAGINAL HYSTERECTOMY  1990's   Social History   Socioeconomic History   Marital status: Single    Spouse name: Not on file   Number of children: Not on file   Years of education: Not  on file   Highest education level: Not on file  Occupational History   Occupation: disability  Tobacco Use   Smoking status: Every Day    Current packs/day: 0.50    Average packs/day: 0.5 packs/day for 10.0 years (5.0 ttl pk-yrs)    Types: Cigarettes   Smokeless tobacco: Never   Tobacco comments:    trying to cut back, 3/29 - only  had one cigarette today, average 4 a day    07/25/23 cut back to 1 a day  Vaping Use   Vaping status: Never Used  Substance and Sexual Activity   Alcohol  use: Not Currently    Comment: stopped last August   Drug use: Not Currently    Types: Cocaine    Comment: 05/12/2013 tried it a few days ago; didn't like it   Sexual activity: Not Currently  Other Topics Concern   Not on file  Social History Narrative   Not on file   Social Drivers of Health   Financial Resource Strain: High Risk (11/06/2022)   Overall Financial Resource Strain (CARDIA)    Difficulty of Paying Living Expenses: Very hard  Food Insecurity: No Food Insecurity (08/27/2023)   Hunger Vital Sign    Worried About Running Out of Food in the Last Year: Never true    Ran Out of Food in the Last Year: Never true  Transportation Needs: No Transportation Needs (08/27/2023)   PRAPARE - Administrator, Civil Service (Medical): No    Lack of Transportation (Non-Medical): No  Physical Activity: Insufficiently Active (11/06/2022)   Exercise Vital Sign    Days of Exercise per Week: 5 days    Minutes of Exercise per Session: 20 min  Stress: No Stress Concern Present (11/06/2022)   [employer] of Occupational Health - Occupational Stress Questionnaire    Feeling of Stress : Only a little  Social Connections: Moderately Isolated (11/06/2022)   Social Connection and Isolation Panel    Frequency of Communication with Friends and Family: Twice a week    Frequency of Social Gatherings with Friends and Family: Once a week    Attends Religious Services: More than 4 times per year    Active Member of [employer] or Organizations: No    Attends Engineer, structural: Never    Marital Status: Never married   Family History  Problem Relation Age of Onset   Hypertension Mother    Kidney failure Mother    Heart attack Father    Hypertension Sister    Diabetes Sister    Hypertension Brother    Diabetes Sister     Allergies  Allergen Reactions   Lisinopril  Cough   Prior to Admission medications   Medication Sig Start Date End Date Taking? Authorizing Provider  acetaminophen  (TYLENOL ) 325 MG tablet Take 650 mg by mouth every 6 (six) hours as needed for mild pain or headache.   Yes [provider]  amLODipine  (NORVASC ) 10 MG tablet TAKE 1 TABLET(10 MG) BY MOUTH DAILY 12/04/23  Yes Petrina Pries, NP  aspirin  EC 81 MG tablet Take 1 tablet (81 mg total) by mouth 2 (two) times daily. For blood clot prevention. Take for 30 days after surgery. Swallow whole. 05/08/23 05/07/24 Yes [place], Jesse J, PA-C  atorvastatin  (LIPITOR) 80 MG tablet Take 1 tablet (80 mg total) by mouth daily. 06/06/23  Yes Raford Riggs, MD  carvedilol  (COREG ) 12.5 MG tablet Take 1 tablet (12.5 mg total) by mouth 2 (two) times daily with a  meal. 06/06/23  Yes Raford Riggs, MD  cetirizine (ZYRTEC) 10 MG tablet Take 10 mg by mouth daily as needed for allergies.   Yes [provider]  FARXIGA  10 MG TABS tablet Take 1 tablet (10 mg total) by mouth daily. 03/07/23  Yes Petrina Pries, NP  ferrous gluconate  [facility]) 324 MG tablet Take 1 tablet (324 mg total) by mouth daily with breakfast. 11/21/23  Yes Georgina Speaks, FNP  furosemide  (LASIX ) 40 MG tablet TAKE 1 TABLET(40 MG) BY MOUTH DAILY 02/11/24  Yes Georgina Speaks, FNP  hydrALAZINE  (APRESOLINE ) 50 MG tablet Take 1 tablet (50 mg total) by mouth 3 (three) times daily. 10/09/23  Yes Georgina Speaks, FNP  pantoprazole  (PROTONIX ) 40 MG tablet Take 40 mg by mouth daily. 09/27/23  Yes [provider]  spironolactone  (ALDACTONE ) 25 MG tablet Take 1 tablet (25 mg total) by mouth daily. 09/06/23  Yes Raford Riggs, MD  polyethylene glycol (MIRALAX  / GLYCOLAX ) 17 g packet Take 17 g by mouth daily as needed for mild constipation or moderate constipation.    [provider]     Positive ROS: All other systems have been reviewed and were otherwise negative with the  exception of those mentioned in the HPI and as above.  Physical Exam:  Vitals:   02/19/24 0553  BP: (!) 159/78  Pulse: 69  Resp: 20  Temp: 98.2 F (36.8 C)  SpO2: 97%   General: Alert, no acute distress Cardiovascular: No pedal edema Respiratory: No cyanosis, no use of accessory musculature GI: No organomegaly, abdomen is soft and non-tender Skin: No lesions in the area of chief complaint Neurologic: Sensation intact distally Psychiatric: Patient is competent for consent with normal mood and affect Lymphatic: No axillary or cervical lymphadenopathy  MUSCULOSKELETAL: Right ankle demonstrates swelling.  Tender to palpation.  Valgus deformity.  Surgical incisions from previous surgeries are well-healed.  She tolerates gentle motion through the ankle but it is painful.  Sensation grossly intact distally.  Foot is warm and well-perfused.  Assessment: Right ankle posttraumatic osteoarthritis with collapse and symptomatic orthopedic hardware   Plan: Plan for hardware removal and ankle arthrodesis of her right ankle joint.  We discussed the risks, benefits and alternatives of surgery which include but are not limited to wound healing complications, infection, nonunion, malunion, need for further surgery, damage to surrounding structures and continued pain.  They understand there is no guarantees to an acceptable outcome.  After weighing these risks they opted to proceed with surgery.     Lonni JONELLE Pae, MD    02/19/2024 6:08 AM

## 2024-02-19 NOTE — Anesthesia Procedure Notes (Signed)
 Procedure Name: Intubation Date/Time: 02/19/2024 7:54 AM  Performed by: Scherrie Mast, CRNAPre-anesthesia Checklist: Patient identified, Emergency Drugs available, Suction available and Patient being monitored Patient Re-evaluated:Patient Re-evaluated prior to induction Oxygen Delivery Method: Circle System Utilized Preoxygenation: Pre-oxygenation with 100% oxygen Induction Type: IV induction and Cricoid Pressure applied Ventilation: Mask ventilation without difficulty Laryngoscope Size: Mac and 3 Grade View: Grade II Tube type: Oral Tube size: 7.0 mm Number of attempts: 1 Airway Equipment and Method: Stylet and Oral airway Placement Confirmation: ETT inserted through vocal cords under direct vision, positive ETCO2 and breath sounds checked- equal and bilateral Secured at: 22 cm Tube secured with: Tape Dental Injury: Teeth and Oropharynx as per pre-operative assessment  Comments: Intubated by Lauraine Rana, SRNA

## 2024-02-19 NOTE — Discharge Instructions (Signed)
 DR. Susa Simmonds FOOT & ANKLE SURGERY POST-OP INSTRUCTIONS   Pain Management The numbing medicine and your leg will last around 18 hours, take a dose of your pain medicine as soon as you feel it wearing off to avoid rebound pain. Keep your foot elevated above heart level.  Make sure that your heel hangs free ('floats'). Take all prescribed medication as directed. If taking narcotic pain medication you may want to use an over-the-counter stool softener to avoid constipation. You may take over-the-counter NSAIDs (ibuprofen, naproxen, etc.) as well as over-the-counter acetaminophen as directed on the packaging as a supplement for your pain and may also use it to wean away from the prescription medication.  Activity Non-weightbearing Keep splint intact  First Postoperative Visit Your first postop visit will be at least 2 weeks after surgery.  This should be scheduled when you schedule surgery. If you do not have a postoperative visit scheduled please call [phone] to schedule an appointment. At the appointment your incision will be evaluated for suture removal, x-rays will be obtained if necessary.  General Instructions Swelling is very common after foot and ankle surgery.  It often takes 3 months for the foot and ankle to begin to feel comfortable.  Some amount of swelling will persist for 6-12 months. DO NOT change the dressing.  If there is a problem with the dressing (too tight, loose, gets wet, etc.) please contact Dr. Donnie Mesa office. DO NOT get the dressing wet.  For showers you can use an over-the-counter cast cover or wrap a washcloth around the top of your dressing and then cover it with a plastic bag and tape it to your leg. DO NOT soak the incision (no tubs, pools, bath, etc.) until you have approval from Dr. Susa Simmonds.  Contact Dr. Garret Reddish office or go to Emergency Room if: Temperature above 101 F. Increasing pain that is unresponsive to pain medication or elevation Excessive redness or  swelling in your foot Dressing problems - excessive bloody drainage, looseness or tightness, or if dressing gets wet Develop pain, swelling, warmth, or discoloration of your calf

## 2024-02-19 NOTE — Transfer of Care (Signed)
 Immediate Anesthesia Transfer of Care Note  Patient: [name]  Procedure(s) Performed: HARDWARE REMOVAL RIGHT ANKLE (Right: Ankle) RIGHT ANKLE ARTHRODESIS (Right: Ankle)  Patient Location: PACU  Anesthesia Type:General  Level of Consciousness: awake and drowsy  Airway & Oxygen Therapy: Patient Spontanous Breathing and Patient connected to face mask  Post-op Assessment: Report given to RN and Post -op Vital signs reviewed and stable  Post vital signs: Reviewed and stable  Last Vitals:  Vitals Value Taken Time  BP 162/73 02/19/24 10:55  Temp 36.9 C 02/19/24 10:54  Pulse 63 02/19/24 10:56  Resp 13 02/19/24 10:56  SpO2 99 % 02/19/24 10:56  Vitals shown include unfiled device data.  Last Pain:  Vitals:   02/19/24 0600  TempSrc:   PainSc: 8       Patients Stated Pain Goal: 1 (02/19/24 0600)  Complications: No notable events documented.

## 2024-02-19 NOTE — Brief Op Note (Signed)
 02/19/2024  10:24 AM  PATIENT:  [name]  63 y.o. female  PRE-OPERATIVE DIAGNOSIS:  RIGHT ANKLE OSTEOARTHRITIS, RIGHT ANKLE PAINFUL ORTHOPEDIC HARDWARE  POST-OPERATIVE DIAGNOSIS:  RIGHT ANKLE OSTEOARTHRITIS, RIGHT ANKLE PAINFUL ORTHOPEDIC HARDWARE  PROCEDURE:  Procedure(s): HARDWARE REMOVAL RIGHT ANKLE (Right) RIGHT ANKLE ARTHRODESIS (Right)  SURGEON:  Surgeons and Role:    * Elsa Lonni SAUNDERS, MD - Primary  PHYSICIAN ASSISTANT: J [place] PA-C  ASSISTANTS: none   ANESTHESIA:   general  EBL:  Minimal  BLOOD ADMINISTERED:none  DRAINS: none   LOCAL MEDICATIONS USED:  NONE  SPECIMEN:  No Specimen  DISPOSITION OF SPECIMEN:  N/A  COUNTS:  YES  TOURNIQUET:  Less than two hours  DICTATION: .Dragon Dictation  PLAN OF CARE: Discharge to home after PACU  PATIENT DISPOSITION:  PACU - hemodynamically stable.   Delay start of Pharmacological VTE agent (>24hrs) due to surgical blood loss or risk of bleeding: yes

## 2024-02-20 ENCOUNTER — Encounter (HOSPITAL_COMMUNITY): Payer: Self-pay | Admitting: Orthopaedic Surgery

## 2024-02-21 MED ORDER — LIDOCAINE-EPINEPHRINE (PF) 1.5 %-1:200000 IJ SOLN
INTRAMUSCULAR | Status: DC | PRN
  Administered 2024-02-19 (×2): 5 mL via PERINEURAL

## 2024-02-21 MED ORDER — BUPIVACAINE-EPINEPHRINE (PF) 0.5% -1:200000 IJ SOLN
INTRAMUSCULAR | Status: DC | PRN
  Administered 2024-02-19: 10 mL via PERINEURAL
  Administered 2024-02-19: 25 mL via PERINEURAL

## 2024-02-21 MED ORDER — BUPIVACAINE-EPINEPHRINE (PF) 0.5% -1:200000 IJ SOLN
INTRAMUSCULAR | Status: DC | PRN
Start: 2024-02-19 — End: 2024-02-21

## 2024-02-21 NOTE — Anesthesia Procedure Notes (Signed)
 Anesthesia Regional Block: Adductor canal block   Pre-Anesthetic Checklist: , timeout performed,  Correct Patient, Correct Site, Correct Laterality,  Correct Procedure, Correct Position, site marked,  Risks and benefits discussed,  Surgical consent,  Pre-op evaluation,  At surgeon's request and post-op pain management  Laterality: Right and Lower  Prep: chloraprep       Needles:  Injection technique: Single-shot      Needle Length: 9cm  Needle Gauge: 22     Additional Needles: Arrow StimuQuik ECHO Echogenic Stimulating PNB Needle  Procedures:,,,, ultrasound used (permanent image in chart),,    Narrative:  Start time: 02/19/2024 7:25 AM End time: 02/19/2024 7:30 AM Injection made incrementally with aspirations every 5 mL.  Performed by: Personally  Anesthesiologist: Leopoldo Bruckner, MD

## 2024-02-21 NOTE — Anesthesia Procedure Notes (Signed)
 Anesthesia Regional Block: Popliteal block   Pre-Anesthetic Checklist: , timeout performed,  Correct Patient, Correct Site, Correct Laterality,  Correct Procedure, Correct Position, site marked,  Risks and benefits discussed,  Surgical consent,  Pre-op evaluation,  At surgeon's request and post-op pain management  Laterality: Right and Lower  Prep: chloraprep       Needles:  Injection technique: Single-shot      Needle Length: 9cm  Needle Gauge: 22     Additional Needles: Arrow StimuQuik ECHO Echogenic Stimulating PNB Needle  Procedures:,,,, ultrasound used (permanent image in chart),,    Narrative:  Start time: 02/19/2024 7:31 AM End time: 02/19/2024 7:36 AM Injection made incrementally with aspirations every 5 mL.  Performed by: Personally  Anesthesiologist: Leopoldo Bruckner, MD

## 2024-02-21 NOTE — Anesthesia Postprocedure Evaluation (Signed)
 Anesthesia Post Note  Patient: [name]  Procedure(s) Performed: HARDWARE REMOVAL RIGHT ANKLE (Right: Ankle) RIGHT ANKLE ARTHRODESIS (Right: Ankle)     Patient location during evaluation: PACU Anesthesia Type: Regional and General Level of consciousness: awake and alert Pain management: pain level controlled Vital Signs Assessment: post-procedure vital signs reviewed and stable Respiratory status: spontaneous breathing, nonlabored ventilation and respiratory function stable Cardiovascular status: blood pressure returned to baseline and stable Postop Assessment: no apparent nausea or vomiting Anesthetic complications: no   There were no known notable events for this encounter.  Last Vitals:  Vitals:   02/19/24 1115 02/19/24 1130  BP: 137/72 (!) 148/68  Pulse: 61 61  Resp: 15 16  Temp:  36.9 C  SpO2: 95% 98%    Last Pain:  Vitals:   02/19/24 1054  TempSrc:   PainSc: Asleep                 Regana Kemple

## 2024-03-05 ENCOUNTER — Ambulatory Visit

## 2024-03-05 DIAGNOSIS — Z0000 Encounter for general adult medical examination without abnormal findings: Secondary | ICD-10-CM

## 2024-03-05 NOTE — Progress Notes (Signed)
 Subjective:   [name] is a 63 y.o. who presents for a Medicare Wellness preventive visit.  As a reminder, Annual Wellness Visits don't include a physical exam, and some assessments may be limited, especially if this visit is performed virtually. We may recommend an in-person follow-up visit with your provider if needed.  Visit Complete: Virtual I connected with  [name] on 03/05/24 by a audio enabled telemedicine application and verified that I am speaking with the correct person using two identifiers.  Patient Location: Home  Provider Location: Office/Clinic  I discussed the limitations of evaluation and management by telemedicine. The patient expressed understanding and agreed to proceed.  Vital Signs: Because this visit was a virtual/telehealth visit, some criteria may be missing or patient reported. Any vitals not documented were not able to be obtained and vitals that have been documented are patient reported.  VideoError- Librarian, academic were attempted between this provider and patient, however failed, due to patient having technical difficulties OR patient did not have access to video capability.  We continued and completed visit with audio only.   Persons Participating in Visit: Patient.  AWV Questionnaire: No: Patient Medicare AWV questionnaire was not completed prior to this visit.  Cardiac Risk Factors include: advanced age (>11men, >49 women);dyslipidemia;hypertension     Objective:    Today's Vitals   03/05/24 1356  PainSc: 9    There is no height or weight on file to calculate BMI.     03/05/2024    2:10 PM 02/19/2024    6:03 AM 02/08/2024    9:14 AM 07/25/2023   11:45 PM 07/25/2023    3:17 PM 05/15/2023    2:57 PM 05/07/2023    9:23 AM  Advanced Directives  Does Patient Have a Medical Advance Directive? No No No No No No No  Would patient like information on creating a medical advance directive? No - Patient  declined No - Patient declined No - Patient declined No - Patient declined  No - Patient declined     Current Medications (verified) Outpatient Encounter Medications as of 03/05/2024  Medication Sig   acetaminophen  (TYLENOL ) 325 MG tablet Take 650 mg by mouth every 6 (six) hours as needed for mild pain or headache.   amLODipine  (NORVASC ) 10 MG tablet TAKE 1 TABLET(10 MG) BY MOUTH DAILY   aspirin  EC 81 MG tablet Take 81 mg by mouth daily. Swallow whole.   atorvastatin  (LIPITOR) 80 MG tablet Take 1 tablet (80 mg total) by mouth daily.   carvedilol  (COREG ) 12.5 MG tablet Take 1 tablet (12.5 mg total) by mouth 2 (two) times daily with a meal.   cetirizine (ZYRTEC) 10 MG tablet Take 10 mg by mouth daily as needed for allergies.   ferrous gluconate  (FERGON) 324 MG tablet Take 1 tablet (324 mg total) by mouth daily with breakfast.   furosemide  (LASIX ) 40 MG tablet TAKE 1 TABLET(40 MG) BY MOUTH DAILY   hydrALAZINE  (APRESOLINE ) 50 MG tablet Take 1 tablet (50 mg total) by mouth 3 (three) times daily.   oxyCODONE  (ROXICODONE ) 5 MG immediate release tablet Take 1 tablet (5 mg total) by mouth every 4 (four) hours as needed.   pantoprazole  (PROTONIX ) 40 MG tablet Take 40 mg by mouth daily.   polyethylene glycol (MIRALAX  / GLYCOLAX ) 17 g packet Take 17 g by mouth daily as needed for mild constipation or moderate constipation.   spironolactone  (ALDACTONE ) 25 MG tablet Take 1 tablet (25 mg total) by mouth daily.  aspirin  (BAYER ASPIRIN ) 325 MG tablet Take 1 tablet by mouth for 30 DAYS for blood clot prevention. Start post op day one (Patient not taking: Reported on 03/05/2024)   FARXIGA  10 MG TABS tablet Take 1 tablet (10 mg total) by mouth daily. (Patient not taking: Reported on 03/05/2024)   No facility-administered encounter medications on file as of 03/05/2024.    Allergies (verified) Lisinopril    History: Past Medical History:  Diagnosis Date   (HFpEF) heart failure with preserved ejection fraction  (HCC) 05/15/2023   Adrenal adenoma 01/11/2021   Anemia    CHF (congestive heart failure) (HCC)    Chronic bronchitis (HCC)    probably once/yr (05/12/2013)   Chronic kidney disease    Diastolic heart failure    Dyslipidemia    pt denies this hx on 05/12/2013   Exertional shortness of breath    GERD (gastroesophageal reflux disease)    Heart murmur    Hypertension    Hypokalemia    Left ventricular hypertrophy 01/11/2021   Primary hypertension 05/12/2013   Venous stasis ulcers (HCC)    Past Surgical History:  Procedure Laterality Date   ANKLE FUSION Right 02/19/2024   Procedure: RIGHT ANKLE ARTHRODESIS;  Surgeon: Elsa Lonni SAUNDERS, MD;  Location: [facility] OR;  Service: Orthopedics;  Laterality: Right;   BIOPSY  02/23/2023   Procedure: BIOPSY;  Surgeon: Wilhelmenia Aloha Raddle., MD;  Location: [facility] ENDOSCOPY;  Service: Gastroenterology;;   COLONOSCOPY N/A 02/23/2023   Procedure: COLONOSCOPY;  Surgeon: Wilhelmenia Aloha Raddle., MD;  Location: [facility] ENDOSCOPY;  Service: Gastroenterology;  Laterality: N/A;   ESOPHAGOGASTRODUODENOSCOPY N/A 02/23/2023   Procedure: ESOPHAGOGASTRODUODENOSCOPY (EGD);  Surgeon: Wilhelmenia Aloha Raddle., MD;  Location: [facility] ENDOSCOPY;  Service: Gastroenterology;  Laterality: N/A;   HARDWARE REMOVAL Right 05/08/2023   Procedure: HARDWARE REMOVAL MEDIAL AND LATERAL;  Surgeon: Elsa Lonni SAUNDERS, MD;  Location: [facility] ORS;  Service: Orthopedics;  Laterality: Right;   HARDWARE REMOVAL Right 02/19/2024   Procedure: HARDWARE REMOVAL RIGHT ANKLE;  Surgeon: Elsa Lonni SAUNDERS, MD;  Location: [facility] OR;  Service: Orthopedics;  Laterality: Right;   HOT HEMOSTASIS N/A 02/23/2023   Procedure: HOT HEMOSTASIS (ARGON PLASMA COAGULATION/BICAP);  Surgeon: Wilhelmenia Aloha Raddle., MD;  Location: [facility] ENDOSCOPY;  Service: Gastroenterology;  Laterality: N/A;   IR US  GUIDE VASC ACCESS RIGHT  02/27/2017   IR VENOGRAM ADRENAL BI  02/27/2017   IR VENOGRAM HEPATIC WO HEMODYNAMIC EVALUATION  02/27/2017   IR VENOGRAM  RENAL BI  02/27/2017   IR VENOUS SAMPLING  02/27/2017   IR VENOUS SAMPLING  02/27/2017   ORIF ANKLE FRACTURE Right 04/18/2023   Procedure: OPEN REDUCTION INTERNAL FIXATION (ORIF) ANKLE FRACTURE;  Surgeon: Edna Toribio LABOR, MD;  Location: [facility] ORS;  Service: Orthopedics;  Laterality: Right;   ORIF ANKLE FRACTURE Right 05/08/2023   Procedure: REVISION OPEN REDUCTION INTERNAL FIXATION (ORIF) OF RIGHT TRIMALLEOLAR ANKLE FRACTURE, OPEN TREATMENT OF SYNDESMOSIS;  Surgeon: Elsa Lonni SAUNDERS, MD;  Location: [facility] ORS;  Service: Orthopedics;  Laterality: Right;   POLYPECTOMY  02/23/2023   Procedure: POLYPECTOMY;  Surgeon: Wilhelmenia Aloha Raddle., MD;  Location: [facility] ENDOSCOPY;  Service: Gastroenterology;;   SYNDESMOSIS REPAIR Right 05/08/2023   Procedure: SYNDESMOSIS REPAIR;  Surgeon: Elsa Lonni SAUNDERS, MD;  Location: [facility] ORS;  Service: Orthopedics;  Laterality: Right;   VAGINAL HYSTERECTOMY  1990's   Family History  Problem Relation Age of Onset   Hypertension Mother    Kidney failure Mother    Heart attack Father    Hypertension Sister    Diabetes  Sister    Hypertension Brother    Diabetes Sister    Social History   Socioeconomic History   Marital status: Single    Spouse name: Not on file   Number of children: Not on file   Years of education: Not on file   Highest education level: Not on file  Occupational History   Occupation: disability  Tobacco Use   Smoking status: Every Day    Current packs/day: 0.50    Average packs/day: 0.5 packs/day for 10.0 years (5.0 ttl pk-yrs)    Types: Cigarettes   Smokeless tobacco: Never   Tobacco comments:    trying to cut back, 3/29 - only had one cigarette today, average 4 a day    07/25/23 cut back to 1 a day  Vaping Use   Vaping status: Never Used  Substance and Sexual Activity   Alcohol  use: Not Currently    Comment: stopped last August   Drug use: Yes    Types: Oxycodone     Comment: 05/12/2013 tried it a few days ago; didn't like it    Sexual activity: Not Currently  Other Topics Concern   Not on file  Social History Narrative   Not on file   Social Drivers of Health   Financial Resource Strain: Low Risk  (03/05/2024)   Overall Financial Resource Strain (CARDIA)    Difficulty of Paying Living Expenses: Not hard at all  Food Insecurity: No Food Insecurity (03/05/2024)   Hunger Vital Sign    Worried About Running Out of Food in the Last Year: Never true    Ran Out of Food in the Last Year: Never true  Transportation Needs: No Transportation Needs (03/05/2024)   PRAPARE - Administrator, Civil Service (Medical): No    Lack of Transportation (Non-Medical): No  Physical Activity: Inactive (03/05/2024)   Exercise Vital Sign    Days of Exercise per Week: 0 days    Minutes of Exercise per Session: 0 min  Stress: No Stress Concern Present (03/05/2024)   [employer] of Occupational Health - Occupational Stress Questionnaire    Feeling of Stress: Not at all  Social Connections: Moderately Integrated (03/05/2024)   Social Connection and Isolation Panel    Frequency of Communication with Friends and Family: More than three times a week    Frequency of Social Gatherings with Friends and Family: Twice a week    Attends Religious Services: More than 4 times per year    Active Member of [employer] or Organizations: Yes    Attends Engineer, structural: More than 4 times per year    Marital Status: Never married    Tobacco Counseling Ready to quit: Not Answered Counseling given: Not Answered Tobacco comments: trying to cut back, 3/29 - only had one cigarette today, average 4 a day 07/25/23 cut back to 1 a day    Clinical Intake:  Pre-visit preparation completed: Yes  Pain : 0-10 Pain Score: 9  Pain Type: Acute pain Pain Orientation: Right Pain Onset: 1 to 4 weeks ago Pain Frequency: Constant     Nutritional Risks: None Diabetes: No  Lab Results  Component Value Date   HGBA1C 4.9 08/23/2021    HGBA1C 5.1 10/17/2017   HGBA1C 5.0 12/20/2016     How often do you need to have someone help you when you read instructions, pamphlets, or other written materials from your doctor or pharmacy?: 1 - Never  Interpreter Needed?: No  Information entered by ::  NAllen LPN   Activities of Daily Living     03/05/2024    2:04 PM 02/19/2024    5:46 AM  In your present state of health, do you have any difficulty performing the following activities:  Hearing? 0 0  Vision? 0 0  Difficulty concentrating or making decisions? 0 0  Walking or climbing stairs? 0   Dressing or bathing? 0   Doing errands, shopping? 0   Preparing Food and eating ? N   Using the Toilet? N   In the past six months, have you accidently leaked urine? N   Do you have problems with loss of bowel control? N   Managing your Medications? N   Managing your Finances? N   Housekeeping or managing your Housekeeping? Y     Patient Care Team: Georgina Speaks, FNP as PCP - General (General Practice) Raford Riggs, MD as PCP - Cardiology (Cardiology) Britto, Errol, MD (Inactive) as Consulting Physician (Surgery) Morgan Clayborne CROME, RN as [facility] Management Little, Clayborne CROME, RN Jolee Cassius PARAS, [facility] as Pharmacist (Pharmacist)  I have updated your Care Teams any recent Medical Services you may have received from other providers in the past year.     Assessment:   This is a routine wellness examination for Blair.  Hearing/Vision screen Hearing Screening - Comments:: Denies hearing issues Vision Screening - Comments:: Regular eye exams, [facility]   Goals Addressed             This Visit's Progress    Patient Stated       03/05/2024, wants to heal from surgery       Depression Screen     03/05/2024    2:11 PM 11/06/2022   10:13 AM 11/06/2022   10:12 AM 10/04/2022    3:17 PM 09/21/2022   12:15 PM 05/02/2022   11:06 AM 09/08/2021    3:28 PM  PHQ 2/9 Scores  PHQ - 2 Score 0 0 0 0 0 0 0  PHQ- 9 Score 3           Fall Risk     03/05/2024    2:11 PM 11/06/2022   10:20 AM 10/04/2022    3:16 PM 09/21/2022   12:15 PM 05/02/2022   11:06 AM  Fall Risk   Falls in the past year? 0 0 1 0 0  Comment   legs gave out    Number falls in past yr: 0  0 0 0  Injury with Fall? 0  0 0 0  Risk for fall due to : Impaired mobility;Impaired balance/gait;Medication side effect  Medication side effect No Fall Risks No Fall Risks  Follow up Falls evaluation completed;Falls prevention discussed  Falls prevention discussed;Education provided;Falls evaluation completed Falls evaluation completed Falls evaluation completed      Data saved with a previous flowsheet row definition    MEDICARE RISK AT HOME:  Medicare Risk at Home Any stairs in or around the home?: No If so, are there any without handrails?: No Home free of loose throw rugs in walkways, pet beds, electrical cords, etc?: Yes Adequate lighting in your home to reduce risk of falls?: Yes Life alert?: No Use of a cane, walker or w/c?: Yes Grab bars in the bathroom?: Yes Shower chair or bench in shower?: No Elevated toilet seat or a handicapped toilet?: Yes  TIMED UP AND GO:  Was the test performed?  No  Cognitive Function: 6CIT completed        03/05/2024  2:12 PM 10/04/2022    3:18 PM 09/08/2021    3:29 PM 11/17/2020   12:09 PM  6CIT Screen  What Year? 0 points 0 points 0 points 0 points  What month? 0 points 0 points 0 points 0 points  What time? 0 points 0 points 0 points 0 points  Count back from 20 0 points 0 points 0 points 0 points  Months in reverse 0 points 2 points 0 points 0 points  Repeat phrase 6 points 4 points 6 points 2 points  Total Score 6 points 6 points 6 points 2 points    Immunizations Immunization History  Administered Date(s) Administered   Influenza, Seasonal, Injecte, Preservative Fre 05/29/2023   Influenza,inj,Quad PF,6+ Mos 05/02/2022   PFIZER(Purple Top)SARS-COV-2 Vaccination 05/24/2020, 08/02/2020   Tdap  09/21/2022    Screening Tests Health Maintenance  Topic Date Due   Pneumococcal Vaccine 83-59 Years old (1 of 2 - PCV) Never done   Zoster Vaccines- Shingrix (1 of 2) Never done   MAMMOGRAM  Never done   COVID-19 Vaccine (3 - Pfizer risk series) 08/30/2020   INFLUENZA VACCINE  03/21/2024   Medicare Annual Wellness (AWV)  03/05/2025   DTaP/Tdap/Td (2 - Td or Tdap) 09/21/2032   Colonoscopy  02/22/2033   Hepatitis C Screening  Completed   HIV Screening  Completed   Hepatitis B Vaccines  Aged Out   HPV VACCINES  Aged Out   Meningococcal B Vaccine  Aged Out    Health Maintenance  Health Maintenance Due  Topic Date Due   Pneumococcal Vaccine 59-46 Years old (1 of 2 - PCV) Never done   Zoster Vaccines- Shingrix (1 of 2) Never done   MAMMOGRAM  Never done   COVID-19 Vaccine (3 - Pfizer risk series) 08/30/2020   Health Maintenance Items Addressed: Due for for pneumonia and shingles vaccines. Hold off on mammogram until leg is better.  Additional Screening:  Vision Screening: Recommended annual ophthalmology exams for early detection of glaucoma and other disorders of the eye. Would you like a referral to an eye doctor? No    Dental Screening: Recommended annual dental exams for proper oral hygiene  Community Resource Referral / Chronic Care Management: CRR required this visit?  No   CCM required this visit?  No   Plan:    I have personally reviewed and noted the following in the patient's chart:   Medical and social history Use of alcohol , tobacco or illicit drugs  Current medications and supplements including opioid prescriptions. Patient is currently taking opioid prescriptions. Information provided to patient regarding non-opioid alternatives. Patient advised to discuss non-opioid treatment plan with their provider. Functional ability and status Nutritional status Physical activity Advanced directives List of other physicians Hospitalizations, surgeries, and ER  visits in previous 12 months Vitals Screenings to include cognitive, depression, and falls Referrals and appointments  In addition, I have reviewed and discussed with patient certain preventive protocols, quality metrics, and best practice recommendations. A written personalized care plan for preventive services as well as general preventive health recommendations were provided to patient.   Ardella FORBES Dawn, LPN   [id number]   After Visit Summary: (Pick Up) Due to this being a telephonic visit, with patients personalized plan was offered to patient and patient has requested to Pick up at office.  Notes: Nothing significant to report at this time.

## 2024-03-05 NOTE — Patient Instructions (Signed)
 [name] , Thank you for taking time out of your busy schedule to complete your Annual Wellness Visit with me. I enjoyed our conversation and look forward to speaking with you again next year. I, as well as your care team,  appreciate your ongoing commitment to your health goals. Please review the following plan we discussed and let me know if I can assist you in the future. Your Game plan/ To Do List    Referrals: If you haven't heard from the office you've been referred to, please reach out to them at the phone provided.  N/a Follow up Visits: Next Medicare AWV with our clinical staff: office will schedule   Have you seen your provider in the last 6 months (3 months if uncontrolled diabetes)? No Next Office Visit with your provider: 03/13/2024 at 2:40  Clinician Recommendations:  Aim for 30 minutes of exercise or brisk walking, 6-8 glasses of water, and 5 servings of fruits and vegetables each day.       This is a list of the screening recommended for you and due dates:  Health Maintenance  Topic Date Due   Pneumococcal Vaccination (1 of 2 - PCV) Never done   Zoster (Shingles) Vaccine (1 of 2) Never done   Mammogram  Never done   COVID-19 Vaccine (3 - Pfizer risk series) 08/30/2020   Flu Shot  03/21/2024   Medicare Annual Wellness Visit  03/05/2025   DTaP/Tdap/Td vaccine (2 - Td or Tdap) 09/21/2032   Colon Cancer Screening  02/22/2033   Hepatitis C Screening  Completed   HIV Screening  Completed   Hepatitis B Vaccine  Aged Out   HPV Vaccine  Aged Out   Meningitis B Vaccine  Aged Out    Advanced directives: (Declined) Advance directive discussed with you today. Even though you declined this today, please call our office should you change your mind, and we can give you the proper paperwork for you to fill out. Advance Care Planning is important because it:  [x]  Makes sure you receive the medical care that is consistent with your values, goals, and preferences  [x]  It provides  guidance to your family and loved ones and reduces their decisional burden about whether or not they are making the right decisions based on your wishes.  Follow the link provided in your after visit summary or read over the paperwork we have mailed to you to help you started getting your Advance Directives in place. If you need assistance in completing these, please reach out to us  so that we can help you!  See attachments for Preventive Care and Fall Prevention Tips.

## 2024-03-07 NOTE — Op Note (Signed)
 [name] female 63 y.o. 02/19/2024  PreOperative Diagnosis: Right ankle symptomatic deep orthopedic hardware, medial Right ankle end-stage posttraumatic osteoarthritis with deformity  PostOperative Diagnosis: Same  PROCEDURE: Right ankle deep orthopedic hardware removal, medial through separate incision Right tibiotalar joint arthrodesis  SURGEON: Lonni Pae, MD  ASSISTANT: Jesse [place], PA-C was necessary for patient positioning, prep, drape, assistance with hardware removal, fusion and closure.  ANESTHESIA: General anesthesia with peripheral nerve blockade  FINDINGS: See below  IMPLANTS: Arthrex tibiotalar joint arthrodesis plate with partially threaded cannulated screw  INDICATIONS:63 y.o. femaleSustained an ankle fracture several months ago and underwent open treatment.  She had failure of her initial implant and underwent revision surgery with me.  She subsequently developed significant posttraumatic arthritis with some valgus deformity through the ankle.  She failed conservative treatment the form of bracing, activity modification and therapy.  She was indicated for arthrodesis for deformity correction and pain relief.   Patient understood the risks, benefits and alternatives to surgery which include but are not limited to wound healing complications, infection, nonunion, malunion, need for further surgery as well as damage to surrounding structures. They also understood the potential for continued pain in that there were no guarantees of acceptable outcome After weighing these risks the patient opted to proceed with surgery.  PROCEDURE:  Patient was identified in the preoperative holding area.  The Right ankle was marked by myself.  Consent was signed by myself and the patient.  Block was performed by anesthesia in the preoperative holding area.  Patient was taken to the operative suite and placed supine on the operative table.  General anesthesia was induced  without difficulty. Bump was placed under the operative hip and bone foam was used.  All bony prominences were well padded.  Tourniquet was placed on the operative thigh.  Preoperative antibiotics were given. The extremity was prepped and draped in the usual sterile fashion and surgical timeout was performed.  The limb was elevated and the tourniquet was inflated to 250 mmHg.  We began by using fluoroscopy to localize the hardware along the medial aspect of the tibia.  Then incision was made overlying the retained hardware.  The incisions were carried sharply through skin and subcutaneous tissue.  Blunt dissection was used to mobilize flaps to gain access to the hardware.  Once hardware was identified the screw heads were cleared of any tissue and a screwdriver was used to sequentially remove the medial screws on the previously placed buttress plate.  The remaining hardware medially was left in place distally.  The hardware that was removed was discarded.  We then turned our attention to the ankle joint.  We proceeded by making a longitudinal incision in the anterior ankle area.  This is taken sharply down through skin and subcutaneous tissue.  Blunt dissection was used to mobilize skin flaps.  The extensor retinacular tissue was identified and incised in line with the incision.  This was between the tibialis anterior and extensor hallucis longus tendons.  Blunt dissection was then used to mobilize the tibialis anterior tendon and extensor hallucis longus tendon.  The neurovascular bundle was identified and protected through the entirety of the case.  Then further dissection was carried down sharply to the bone and anterior joint capsule.  Incision was made through the joint capsule to gain access to the ankle joint.  Then subperiosteal flaps were created on the anterior distal tibia and the joint capsule tissue was mobilized to gain access to the entire anterior aspect of  the ankle joint.  Once access to the  ankle joint was identified there was significant amount of osteophytes on the dorsal talus and anterior distal tibia.  Using an osteotome the distal tibial osteophytes were removed to gain access to the joint.  Then the dorsal talar osteophytes were removed.  Then a large osteotome was placed within the joint to mobilize the joint.  Then a lamina spreader was placed within the tibiotalar joint to distract it for joint preparation purposes.    Then using an osteotome and a curette the remaining cartilage surfaces were denuded of cartilage.  The joint was fully mobilized.  Once the cartilage surfaces on the dorsal talar dome and tibial plafond were removed the joint was irrigated and further inspected.  Care was taken to remove the cartilage surfaces from the medial and lateral gutters as well using a curette.  Once adequate cartilage removal was completed a drill was used to trephinate the distal tibia and dorsal talus. There is some valgus deformity through the joint due to collapse of the lateral aspect of the tibia.  Extra bone was removed from the medial aspect of the tibial plafond to reduce the joint in a more anatomic and neutral position. Then the joint was reduced under direct visualization and held provisionally with K wire fixation.  Then the appropriate size plate was placed anteriorly and confirmed on fluoroscopy to be acceptable.  Then a combination of locking and nonlocking screws with compression and a headed partly threaded cannulated screw was placed across the ankle joint with good compression and fixation.    Fluoroscopy then confirmed appropriate plate placement, reduction of the ankle joint in the AP and lateral planes.  The wounds were then irrigated closely with normal saline.    The tourniquet was released and hemostasis was obtained.The wound was then closed in a layered fashion using 3-0 Monocryl and 3-0 nylon suture.  This included the joint capsule and extensor retinacular  tissue.  Soft dressing was then placed.  Nonweightbearing short leg splint was placed.  Patient was awakened from anesthesia and taken to recovery in stable condition.  There were no complications.  They tolerated the procedure well.   POST OPERATIVE INSTRUCTIONS: Nonweightbearing to operative extremity Keep splint dry and intact Elevate limb Take one 325 mg aspirin  daily for DVT prophylaxis if not already on blood thinner Follow-up in 2 weeks for splint removal, suture removal if appropriate and nonweightbearing x-rays of the operative ankle  She will be placed in a short leg cast.    TOURNIQUET TIME:Less than 2 hours  BLOOD LOSS:  less than 50 mL         DRAINS: none         SPECIMEN: none       COMPLICATIONS:  * No complications entered in OR log *         Disposition: PACU - hemodynamically stable.         Condition: stable

## 2024-03-11 ENCOUNTER — Encounter (HOSPITAL_BASED_OUTPATIENT_CLINIC_OR_DEPARTMENT_OTHER): Admitting: Cardiovascular Disease

## 2024-03-13 ENCOUNTER — Ambulatory Visit: Admitting: Family Medicine

## 2024-03-13 ENCOUNTER — Encounter: Payer: Self-pay | Admitting: Family Medicine

## 2024-03-13 VITALS — BP 124/80 | HR 95 | Temp 99.0°F | Ht 68.0 in | Wt 230.0 lb

## 2024-03-13 DIAGNOSIS — D508 Other iron deficiency anemias: Secondary | ICD-10-CM

## 2024-03-13 DIAGNOSIS — K5909 Other constipation: Secondary | ICD-10-CM

## 2024-03-13 MED ORDER — FUSION PLUS PO CAPS
1.0000 | ORAL_CAPSULE | Freq: Every day | ORAL | 5 refills | Status: DC
Start: 2024-03-13 — End: 2024-05-28

## 2024-03-13 MED ORDER — POLYETHYLENE GLYCOL 3350 17 G PO PACK
17.0000 g | PACK | Freq: Every day | ORAL | 5 refills | Status: AC | PRN
Start: 2024-03-13 — End: ?

## 2024-03-13 NOTE — Progress Notes (Signed)
 I,Jameka J Llittleton, CMA,acting as a Neurosurgeon for [employer], NP.,have documented all relevant documentation on the behalf of [name], NP,as directed by  [name], NP while in the presence of [name], NP.  Subjective:  Patient ID: [name] , female    DOB: 08/20/1961 , 63 y.o.   MRN: [id number]  Chief Complaint  Patient presents with   Diverticulitis    Patient presents today for a flare up. She stated she feels really short of breath and feels like she doesn't have any energy. She reports she just doesn't feel well overall. Patient reports she doesn't have an appetite.     HPI Discussed the use of AI scribe software for clinical note transcription with the patient, who gave verbal consent to proceed.  History of Present Illness     [name] is a 63 year old female who presents with abdominal complaints following recent surgery.  She has been experiencing constipation since her surgery three weeks ago, with no bowel movement in the past week, deviating from her usual pattern of twice a week. She attributes this to the use of oxycodone  for post-surgical pain management.  She has attempted to manage the constipation with Miralax  and methamphetamine citrate, though she has not been taking Miralax  daily. She currently does not have Miralax  at home.  She reports a lack of appetite, stating she feels full and unable to eat, which she believes is related to the constipation.  She has run out of her iron  tablets and has not been taking them recently, which is a change since her surgery.      Past Medical History:  Diagnosis Date   (HFpEF) heart failure with preserved ejection fraction (HCC) 05/15/2023   Adrenal adenoma 01/11/2021   Anemia    CHF (congestive heart failure) (HCC)    Chronic bronchitis (HCC)    probably once/yr (05/12/2013)   Chronic kidney disease    Diastolic heart failure    Dyslipidemia    pt denies this hx on 05/12/2013   Exertional  shortness of breath    GERD (gastroesophageal reflux disease)    Heart murmur    Hypertension    Hypokalemia    Left ventricular hypertrophy 01/11/2021   Primary hypertension 05/12/2013   Venous stasis ulcers (HCC)      Family History  Problem Relation Age of Onset   Hypertension Mother    Kidney failure Mother    Heart attack Father    Hypertension Sister    Diabetes Sister    Hypertension Brother    Diabetes Sister      Current Outpatient Medications:    acetaminophen  (TYLENOL ) 325 MG tablet, Take 650 mg by mouth every 6 (six) hours as needed for mild pain or headache., Disp: , Rfl:    amLODipine  (NORVASC ) 10 MG tablet, TAKE 1 TABLET(10 MG) BY MOUTH DAILY, Disp: 90 tablet, Rfl: 1   aspirin  (BAYER ASPIRIN ) 325 MG tablet, Take 1 tablet by mouth for 30 DAYS for blood clot prevention. Start post op day one, Disp: 30 tablet, Rfl: 0   aspirin  EC 81 MG tablet, Take 81 mg by mouth daily. Swallow whole., Disp: , Rfl:    atorvastatin  (LIPITOR) 80 MG tablet, Take 1 tablet (80 mg total) by mouth daily., Disp: 90 tablet, Rfl: 3   carvedilol  (COREG ) 12.5 MG tablet, Take 1 tablet (12.5 mg total) by mouth 2 (two) times daily with a meal., Disp: 180 tablet, Rfl: 3   cetirizine (ZYRTEC) 10 MG  tablet, Take 10 mg by mouth daily as needed for allergies., Disp: , Rfl:    FARXIGA  10 MG TABS tablet, Take 1 tablet (10 mg total) by mouth daily., Disp: 90 tablet, Rfl: 1   furosemide  (LASIX ) 40 MG tablet, TAKE 1 TABLET(40 MG) BY MOUTH DAILY, Disp: 90 tablet, Rfl: 1   hydrALAZINE  (APRESOLINE ) 50 MG tablet, Take 1 tablet (50 mg total) by mouth 3 (three) times daily., Disp: 270 tablet, Rfl: 1   Iron -FA-B Cmp-C-Biot-Probiotic (FUSION PLUS) CAPS, Take 1 capsule by mouth daily., Disp: 30 capsule, Rfl: 5   oxyCODONE  (ROXICODONE ) 5 MG immediate release tablet, Take 1 tablet (5 mg total) by mouth every 4 (four) hours as needed., Disp: 30 tablet, Rfl: 0   pantoprazole  (PROTONIX ) 40 MG tablet, Take 40 mg by mouth  daily., Disp: , Rfl:    spironolactone  (ALDACTONE ) 25 MG tablet, Take 1 tablet (25 mg total) by mouth daily., Disp: 90 tablet, Rfl: 1   polyethylene glycol (MIRALAX  / GLYCOLAX ) 17 g packet, Take 17 g by mouth daily as needed for mild constipation or moderate constipation., Disp: 14 each, Rfl: 5   senna-docusate (SENOKOT-S) 8.6-50 MG tablet, Take 1 tablet by mouth daily., Disp: 30 tablet, Rfl: 0   sodium phosphate  (FLEET) ENEM, Place 399 mLs (3 enemas total) rectally daily as needed for up to 3 doses for severe constipation., Disp: 133 mL, Rfl: 0   Allergies  Allergen Reactions   Lisinopril  Cough     Review of Systems  HENT: Negative.    Respiratory: Negative.    Cardiovascular: Negative.   Gastrointestinal:  Positive for constipation.  Musculoskeletal:  Positive for gait problem.       Has a boot on right leg on wheel chair     Today's Vitals   03/13/24 1424  BP: 124/80  Pulse: 95  Temp: 99 F (37.2 C)  TempSrc: Oral  Weight: 230 lb (104.3 kg)  Height: 5' 8 (1.727 m)  PainSc: 0-No pain   Body mass index is 34.97 kg/m.  Wt Readings from Last 3 Encounters:  03/13/24 230 lb (104.3 kg)  02/19/24 230 lb (104.3 kg)  02/08/24 236 lb 14.4 oz (107.5 kg)    The ASCVD Risk score (Arnett DK, et al., 2019) failed to calculate for the following reasons:   The valid total cholesterol range is 130 to 320 mg/dL  Objective:  Physical Exam HENT:     Head: Normocephalic.  Cardiovascular:     Rate and Rhythm: Normal rate.  Pulmonary:     Effort: Pulmonary effort is normal.     Breath sounds: Normal breath sounds.  Abdominal:     General: Bowel sounds are normal.  Neurological:     Mental Status: She is alert.         Assessment And Plan:  Other constipation Assessment & Plan:  Constipation due to surgery and oxycodone , causing discomfort and appetite loss. Diverticulitis not suspected. - Advise daily Miralax  (polyethylene glycol). - Provide stool softener sample. -  Encourage high-fiber diet. - Recommend Miralax  with coffee, juice, or water.   Orders: -     Polyethylene Glycol 3350 ; Take 17 g by mouth daily as needed for mild constipation or moderate constipation.  Dispense: 14 each; Refill: 5  Iron  deficiency anemia due to dietary causes Assessment & Plan: Anemia worsened by surgery and lack of iron  supplementation. - Prescribe iron  tablets (Ferrous gluconate ).  Orders: -     Fusion Plus; Take 1 capsule by mouth daily.  Dispense: 30 capsule; Refill: 5   Assessment & Plan       Return if symptoms worsen or fail to improve.  Patient was given opportunity to ask questions. Patient verbalized understanding of the plan and was able to repeat key elements of the plan. All questions were answered to their satisfaction.    I, [name], NP, have reviewed all documentation for this visit. The documentation on 03/24/2024 for the exam, diagnosis, procedures, and orders are all accurate and complete.   IF YOU HAVE BEEN REFERRED TO A SPECIALIST, IT MAY TAKE 1-2 WEEKS TO SCHEDULE/PROCESS THE REFERRAL. IF YOU HAVE NOT HEARD FROM US /SPECIALIST IN TWO WEEKS, PLEASE GIVE US  A CALL AT [phone].

## 2024-03-14 ENCOUNTER — Other Ambulatory Visit: Payer: Self-pay

## 2024-03-14 ENCOUNTER — Encounter (HOSPITAL_COMMUNITY): Payer: Self-pay | Admitting: Emergency Medicine

## 2024-03-14 ENCOUNTER — Emergency Department (HOSPITAL_COMMUNITY)

## 2024-03-14 ENCOUNTER — Emergency Department (HOSPITAL_COMMUNITY)
Admission: EM | Admit: 2024-03-14 | Discharge: 2024-03-14 | Disposition: A | Discharge status: Home / Self Care | Attending: Emergency Medicine | Admitting: Emergency Medicine

## 2024-03-14 DIAGNOSIS — Z79899 Other long term (current) drug therapy: Secondary | ICD-10-CM | POA: Insufficient documentation

## 2024-03-14 DIAGNOSIS — N184 Chronic kidney disease, stage 4 (severe): Secondary | ICD-10-CM | POA: Diagnosis not present

## 2024-03-14 DIAGNOSIS — K529 Noninfective gastroenteritis and colitis, unspecified: Secondary | ICD-10-CM | POA: Diagnosis not present

## 2024-03-14 DIAGNOSIS — R059 Cough, unspecified: Secondary | ICD-10-CM | POA: Diagnosis not present

## 2024-03-14 DIAGNOSIS — I130 Hypertensive heart and chronic kidney disease with heart failure and stage 1 through stage 4 chronic kidney disease, or unspecified chronic kidney disease: Secondary | ICD-10-CM | POA: Insufficient documentation

## 2024-03-14 DIAGNOSIS — R197 Diarrhea, unspecified: Secondary | ICD-10-CM | POA: Diagnosis not present

## 2024-03-14 DIAGNOSIS — R0602 Shortness of breath: Secondary | ICD-10-CM | POA: Insufficient documentation

## 2024-03-14 DIAGNOSIS — K429 Umbilical hernia without obstruction or gangrene: Secondary | ICD-10-CM | POA: Diagnosis not present

## 2024-03-14 DIAGNOSIS — R748 Abnormal levels of other serum enzymes: Secondary | ICD-10-CM | POA: Insufficient documentation

## 2024-03-14 DIAGNOSIS — I509 Heart failure, unspecified: Secondary | ICD-10-CM | POA: Diagnosis not present

## 2024-03-14 DIAGNOSIS — Z720 Tobacco use: Secondary | ICD-10-CM | POA: Insufficient documentation

## 2024-03-14 DIAGNOSIS — R531 Weakness: Secondary | ICD-10-CM | POA: Diagnosis not present

## 2024-03-14 DIAGNOSIS — R1084 Generalized abdominal pain: Secondary | ICD-10-CM | POA: Diagnosis not present

## 2024-03-14 DIAGNOSIS — D3501 Benign neoplasm of right adrenal gland: Secondary | ICD-10-CM | POA: Diagnosis not present

## 2024-03-14 DIAGNOSIS — D72829 Elevated white blood cell count, unspecified: Secondary | ICD-10-CM | POA: Insufficient documentation

## 2024-03-14 DIAGNOSIS — R1032 Left lower quadrant pain: Secondary | ICD-10-CM | POA: Diagnosis not present

## 2024-03-14 DIAGNOSIS — R1030 Lower abdominal pain, unspecified: Secondary | ICD-10-CM | POA: Diagnosis present

## 2024-03-14 DIAGNOSIS — I517 Cardiomegaly: Secondary | ICD-10-CM | POA: Diagnosis not present

## 2024-03-14 DIAGNOSIS — Z7982 Long term (current) use of aspirin: Secondary | ICD-10-CM | POA: Insufficient documentation

## 2024-03-14 LAB — COMPREHENSIVE METABOLIC PANEL WITH GFR
ALT: 16 U/L (ref 0–44)
AST: 14 U/L — ABNORMAL LOW (ref 15–41)
Albumin: 3.9 g/dL (ref 3.5–5.0)
Alkaline Phosphatase: 101 U/L (ref 38–126)
Anion gap: 9 (ref 5–15)
BUN: 37 mg/dL — ABNORMAL HIGH (ref 8–23)
CO2: 24 mmol/L (ref 22–32)
Calcium: 9 mg/dL (ref 8.9–10.3)
Chloride: 106 mmol/L (ref 98–111)
Creatinine, Ser: 2.79 mg/dL — ABNORMAL HIGH (ref 0.44–1.00)
GFR, Estimated: 18 mL/min — ABNORMAL LOW (ref >60)
Glucose, Bld: 103 mg/dL — ABNORMAL HIGH (ref 70–99)
Potassium: 4.1 mmol/L (ref 3.5–5.1)
Sodium: 139 mmol/L (ref 135–145)
Total Bilirubin: 1.1 mg/dL (ref 0.0–1.2)
Total Protein: 8.3 g/dL — ABNORMAL HIGH (ref 6.5–8.1)

## 2024-03-14 LAB — CBC WITH DIFFERENTIAL/PLATELET
Abs Immature Granulocytes: 0.05 K/uL (ref 0.00–0.07)
Basophils Absolute: 0 K/uL (ref 0.0–0.1)
Basophils Relative: 0 %
Eosinophils Absolute: 0.1 K/uL (ref 0.0–0.5)
Eosinophils Relative: 1 %
HCT: 33 % — ABNORMAL LOW (ref 36.0–46.0)
Hemoglobin: 9.2 g/dL — ABNORMAL LOW (ref 12.0–15.0)
Immature Granulocytes: 1 %
Lymphocytes Relative: 13 %
Lymphs Abs: 1.4 K/uL (ref 0.7–4.0)
MCH: 27 pg (ref 26.0–34.0)
MCHC: 27.9 g/dL — ABNORMAL LOW (ref 30.0–36.0)
MCV: 96.8 fL (ref 80.0–100.0)
Monocytes Absolute: 0.6 K/uL (ref 0.1–1.0)
Monocytes Relative: 5 %
Neutro Abs: 8.4 K/uL — ABNORMAL HIGH (ref 1.7–7.7)
Neutrophils Relative %: 80 %
Platelets: 402 K/uL — ABNORMAL HIGH (ref 150–400)
RBC: 3.41 MIL/uL — ABNORMAL LOW (ref 3.87–5.11)
RDW: 14.5 % (ref 11.5–15.5)
WBC: 10.6 K/uL — ABNORMAL HIGH (ref 4.0–10.5)
nRBC: 0 % (ref 0.0–0.2)

## 2024-03-14 LAB — RESP PANEL BY RT-PCR (RSV, FLU A&B, COVID)  RVPGX2
Influenza A by PCR: NEGATIVE
Influenza B by PCR: NEGATIVE
Resp Syncytial Virus by PCR: NEGATIVE
SARS Coronavirus 2 by RT PCR: NEGATIVE

## 2024-03-14 LAB — LIPASE, BLOOD: Lipase: 89 U/L — ABNORMAL HIGH (ref 11–51)

## 2024-03-14 LAB — BRAIN NATRIURETIC PEPTIDE: B Natriuretic Peptide: 83.4 pg/mL (ref 0.0–100.0)

## 2024-03-14 MED ORDER — MAGNESIUM CITRATE PO SOLN
1.0000 | Freq: Once | ORAL | Status: DC
  Filled 2024-03-14: qty 296

## 2024-03-14 MED ORDER — FLEET ENEMA RE ENEM: 1.0000 | ENEMA | Freq: Once | RECTAL | Status: DC

## 2024-03-14 MED ORDER — OXYCODONE-ACETAMINOPHEN 5-325 MG PO TABS
1.0000 | ORAL_TABLET | Freq: Once | ORAL | Status: AC
  Administered 2024-03-14: 1 via ORAL
  Filled 2024-03-14: qty 1

## 2024-03-14 MED ORDER — SENNOSIDES-DOCUSATE SODIUM 8.6-50 MG PO TABS: 1.0000 | ORAL_TABLET | Freq: Every day | ORAL | 0 refills | Status: DC

## 2024-03-14 MED ORDER — FLEET ENEMA RE ENEM: 3.0000 | ENEMA | Freq: Every day | RECTAL | 0 refills | Status: DC | PRN

## 2024-03-14 NOTE — ED Provider Triage Note (Signed)
 Emergency Medicine Provider Triage Evaluation Note  [name] , a 63 y.o. female  was evaluated in triage.  Pt complains of SHOB, cough, fatigue x 1 wk. Hx CHF  Review of Systems  Positive: SHOB, cough, fatigue, appetite change, abdominal pain LLQ Negative: HA, N/V/D  Physical Exam  There were no vitals taken for this visit. Gen:   Awake, no distress, uncomfortable appearing   Resp:  Normal effort  MSK:   Moves extremities without difficulty  Other:    Medical Decision Making  Medically screening exam initiated at 10:58 AM.  Appropriate orders placed.  [name] was informed that the remainder of the evaluation will be completed by another provider, this initial triage assessment does not replace that evaluation, and the importance of remaining in the ED until their evaluation is complete.  Labs and imaging ordered   [name] 03/14/24 1102

## 2024-03-14 NOTE — ED Triage Notes (Signed)
 Pt BIB EMS from home, c/o SOB, cough, and lethargy x 1 week.   BP 150 palpated P 80 SpO2 98% CBG 112

## 2024-03-14 NOTE — Discharge Instructions (Addendum)
 You came into the ED due to lower belly pain and possible diverticulitis. The CT you had shows you are not having an active diverticulitis flare, but instead are having sterocoral colitis. This is caused by chronic constipation pushing against the colon, causing your colon to swell and become painful. There is no rupture of your colon. I will send a bowel regimen your pharmacy to help you pass your constipation at home. This includes an at home enema, senna, and the miralax  you have at home. Please take these medicines to have a bowel movement. If after 3 days you do not have a bowel movement and have continued pain, please return to the ED.

## 2024-03-14 NOTE — ED Notes (Signed)
 Patient transported to x-ray. ?

## 2024-03-14 NOTE — ED Provider Notes (Signed)
 [facility]ERG[facility] DEPARTMENT AT [facility] Provider Note   CSN: [id number] Arrival date & time: 03/14/24  1048     Patient presents with: Shortness of Breath and Abdominal Pain   [name] is a 63 y.o. female with a past medical history of CHF, CKD 4, HTN , hypokalemia, and tobacco use who presented to the ED from home for lower abdominal pain and shortness of breath. Patient states she has been having intermittent, sharp abdominal pain for the last 3-4 days with associated decreased appetite. States her stool is black and watery, but has a history of iron  deficiency anemia for which she takes iron  supplements. Has some fecal incontinence, wearing pull-ups. Denies chest pain, nausea or vomiting, fever, chills, or dysuria. Has a history of diverticulitis for which she was hospitalized and was given abx in October 2024. Saw her PCP yesterday who prescribed iron  supplements and Miralax . Patient had right ankle surgery on 7/1.     Prior to Admission medications   Medication Sig Start Date End Date Taking? Authorizing Provider  acetaminophen  (TYLENOL ) 325 MG tablet Take 650 mg by mouth every 6 (six) hours as needed for mild pain or headache.    [provider]  amLODipine  (NORVASC ) 10 MG tablet TAKE 1 TABLET(10 MG) BY MOUTH DAILY 12/04/23   Petrina Pries, NP  aspirin  (BAYER ASPIRIN ) 325 MG tablet Take 1 tablet by mouth for 30 DAYS for blood clot prevention. Start post op day one 02/19/24   [place], Jesse J, PA-C  aspirin  EC 81 MG tablet Take 81 mg by mouth daily. Swallow whole.    [provider]  atorvastatin  (LIPITOR) 80 MG tablet Take 1 tablet (80 mg total) by mouth daily. 06/06/23   Raford Riggs, MD  carvedilol  (COREG ) 12.5 MG tablet Take 1 tablet (12.5 mg total) by mouth 2 (two) times daily with a meal. 06/06/23   Raford Riggs, MD  cetirizine (ZYRTEC) 10 MG tablet Take 10 mg by mouth daily as needed for allergies.    [provider]   FARXIGA  10 MG TABS tablet Take 1 tablet (10 mg total) by mouth daily. 03/07/23   Petrina Pries, NP  furosemide  (LASIX ) 40 MG tablet TAKE 1 TABLET(40 MG) BY MOUTH DAILY 02/11/24   Georgina Speaks, FNP  hydrALAZINE  (APRESOLINE ) 50 MG tablet Take 1 tablet (50 mg total) by mouth 3 (three) times daily. 10/09/23   Georgina Speaks, FNP  Iron -FA-B Cmp-C-Biot-Probiotic (FUSION PLUS) CAPS Take 1 capsule by mouth daily. 03/13/24   Petrina Pries, NP  oxyCODONE  (ROXICODONE ) 5 MG immediate release tablet Take 1 tablet (5 mg total) by mouth every 4 (four) hours as needed. 02/19/24   [place], Jesse J, PA-C  pantoprazole  (PROTONIX ) 40 MG tablet Take 40 mg by mouth daily. 09/27/23   [provider]  polyethylene glycol (MIRALAX  / GLYCOLAX ) 17 g packet Take 17 g by mouth daily as needed for mild constipation or moderate constipation. 03/13/24   Petrina Pries, NP  senna-docusate (SENOKOT-S) 8.6-50 MG tablet Take 1 tablet by mouth daily. 03/14/24  Yes Yogi Arther, DO  sodium phosphate  (FLEET) ENEM Place 399 mLs (3 enemas total) rectally daily as needed for up to 3 doses for severe constipation. 03/14/24  Yes Aiman Sonn, DO  spironolactone  (ALDACTONE ) 25 MG tablet Take 1 tablet (25 mg total) by mouth daily. 09/06/23   Raford Riggs, MD    Allergies: Lisinopril     Review of Systems  Constitutional:  Positive for appetite change and fatigue. Negative for chills  and fever.  Respiratory:  Positive for shortness of breath. Negative for wheezing.   Cardiovascular:  Negative for chest pain and palpitations.  Gastrointestinal:  Positive for abdominal pain, diarrhea and rectal pain. Negative for abdominal distention, nausea and vomiting.  Genitourinary:  Negative for dysuria and urgency.  Neurological:  Negative for dizziness, weakness and light-headedness.    Updated Vital Signs BP (!) 172/70   Pulse 64   Temp 98.1 F (36.7 C)   Resp 17   SpO2 100%   Physical Exam Constitutional:      General: She is in acute  distress.     Comments: Tearful from pain  HENT:     Head: Normocephalic and atraumatic.  Eyes:     Extraocular Movements: Extraocular movements intact.     Pupils: Pupils are equal, round, and reactive to light.  Cardiovascular:     Rate and Rhythm: Normal rate and regular rhythm.     Pulses: Normal pulses.  Pulmonary:     Effort: Pulmonary effort is normal.     Breath sounds: Normal breath sounds. No decreased breath sounds.  Abdominal:     General: Bowel sounds are normal.     Tenderness: There is abdominal tenderness. There is no rebound.     Comments: Pain with palpation to the lower quadrants bilaterally  Musculoskeletal:        General: Normal range of motion.     Cervical back: Normal range of motion.     Right lower leg: No tenderness. No edema.     Left lower leg: No tenderness. No edema.     Comments: Ankle boot to right lower extremity.   Skin:    General: Skin is warm and dry.  Neurological:     General: No focal deficit present.     Mental Status: She is alert and oriented to person, place, and time.  Psychiatric:        Mood and Affect: Mood normal.     (all labs ordered are listed, but only abnormal results are displayed) Labs Reviewed  CBC WITH DIFFERENTIAL/PLATELET - Abnormal; Notable for the following components:      Result Value   WBC 10.6 (*)    RBC 3.41 (*)    Hemoglobin 9.2 (*)    HCT 33.0 (*)    MCHC 27.9 (*)    Platelets 402 (*)    Neutro Abs 8.4 (*)    All other components within normal limits  COMPREHENSIVE METABOLIC PANEL WITH GFR - Abnormal; Notable for the following components:   Glucose, Bld 103 (*)    BUN 37 (*)    Creatinine, Ser 2.79 (*)    Total Protein 8.3 (*)    AST 14 (*)    GFR, Estimated 18 (*)    All other components within normal limits  LIPASE, BLOOD - Abnormal; Notable for the following components:   Lipase 89 (*)    All other components within normal limits  RESP PANEL BY RT-PCR (RSV, FLU A&B, COVID)  RVPGX2  BRAIN  NATRIURETIC PEPTIDE  URINALYSIS, ROUTINE W REFLEX MICROSCOPIC    EKG: None  Radiology: CT ABDOMEN PELVIS WO CONTRAST Result Date: 03/14/2024 CLINICAL DATA:  Left lower quadrant abdominal pain and change in appetite. EXAM: CT ABDOMEN AND PELVIS WITHOUT CONTRAST TECHNIQUE: Multidetector CT imaging of the abdomen and pelvis was performed following the standard protocol without IV contrast. RADIATION DOSE REDUCTION: This exam was performed according to the departmental dose-optimization program which includes automated exposure control,  adjustment of the mA and/or kV according to patient size and/or use of iterative reconstruction technique. COMPARISON:  05/15/2023 FINDINGS: Lower chest: Mildly enlarged heart. Small pericardial effusion with a maximum thickness of 9.5 mm. Minimal left lower lobe atelectasis adjacent to the heart. Hepatobiliary: No focal liver abnormality is seen. No gallstones, gallbladder wall thickening, or biliary dilatation. Pancreas: Unremarkable. No pancreatic ductal dilatation or surrounding inflammatory changes. Spleen: Normal in size without focal abnormality. Adrenals/Urinary Tract: Stable low-density right adrenal mass measuring 4.2 cm in maximum diameter, previously 4.3 cm. This previously had density measurements of an adenoma. Stable mild left adrenal hyperplasia. Unremarkable kidneys, ureters and urinary bladder. Stomach/Bowel: Again demonstrated is large amount of stool in the rectum and distal sigmoid colon with mild diffuse rectal and distal sigmoid colon wall thickening. Moderate stool elsewhere in the colon. Normal-appearing stomach, small bowel and appendix. Vascular/Lymphatic: Atheromatous arterial calcifications without aneurysm. No enlarged lymph nodes. Reproductive: Status post hysterectomy. No adnexal masses. Other: Stable small to moderate-sized umbilical hernia containing fat. Minimal presacral fluid with mild improvement. Musculoskeletal: Mild lower thoracic spine  degenerative changes. Mild lumbar spine facet degenerative changes. IMPRESSION: 1. Large amount of stool in the rectum and distal sigmoid colon with mild diffuse rectal and distal sigmoid colon wall thickening, consistent with stercoral colitis. 2. Moderate stool elsewhere in the colon. 3. No evidence of diverticulitis. 4. Mild cardiomegaly with a small pericardial effusion. 5. Stable 4.2 cm right adrenal adenoma. 6. Stable small to moderate-sized umbilical hernia containing fat. Electronically Signed   By: Elspeth Bathe M.D.   On: 03/14/2024 18:35   DG Chest 2 View Result Date: 03/14/2024 CLINICAL DATA:  Shortness of breath, cough. EXAM: CHEST - 2 VIEW COMPARISON:  July 25, 2023. FINDINGS: Stable cardiomegaly. Both lungs are clear. The visualized skeletal structures are unremarkable. IMPRESSION: No active cardiopulmonary disease. Electronically Signed   By: Lynwood Landy Raddle M.D.   On: 03/14/2024 11:39     Procedures   Medications Ordered in the ED  sodium phosphate  (FLEET) enema 1 enema (1 enema Rectal Patient Refused/Not Given 03/14/24 1850)  magnesium  citrate solution 1 Bottle (1 Bottle Oral Patient Refused/Not Given 03/14/24 1910)  oxyCODONE -acetaminophen  (PERCOCET/ROXICET) 5-325 MG per tablet 1 tablet (1 tablet Oral Given 03/14/24 1640)                                    Medical Decision Making Patient presented to ED for intermittent abdominal pain x3-4 days. Has hx of diverticulitis. Differential diagnosis includes ischemic colitis vs colorectal cancer vs infectious colitis vs IBD vs PID vs diverticulitis. Will order CT Abd/Pelvis without contrast.  CXR normal with no signs of infection or pleural fluid. Will order Labs, BNP, troponins, lipase, and UA. Respiratory PCR. Will place the patient on cardiac monitoring given cardiac history and give Percocet for pain.   5PM CBC shows slight leukocytosis of 10.6 with a left shift of 8.4. Hgb 9.2 (baseline 10-11). CMP shows elevated lipase 89, GFR  18, BUN 37, and Cr 2.79. BNP normal. Respiratory PCR negative. EKG shows sinus rhythm with prolonged PR interval, similar to her EKG 07/2023. Pending UA and CT Ab/Pelv.  645PM CT Abd/Pelv shows a large amount of stool in the rectum and distal sigmoid colon with mild diffuse rectal and distal sigmoid colon wall thickening, consistent with stercoral colitis. Moderate stool elsewhere in the colon. No evidence of diverticulitis. Stable 4.2cm right adrenal adenoma. Stable small  to moderate-sized umbilical hernia containing fat. Will order fleet enema and magnesium  citrate to help pass bowel movement.   7PM  Patient is refusing enema because she feels like this will not help her since she feels like she has diarrhea. Discussed her colitis and how she can still have constipation. Patient voiced understanding but is wanting to attempt BM at home with bowel regimen.   Amount and/or Complexity of Data Reviewed Radiology: ordered.  Risk OTC drugs. Prescription drug management.      Final diagnoses:  Colitis    ED Discharge Orders          Ordered    senna-docusate (SENOKOT-S) 8.6-50 MG tablet  Daily        03/14/24 1925    sodium phosphate  (FLEET) ENEM  Daily PRN        03/14/24 1925               Zienna Ahlin, DO 03/14/24 1927    Pamella Ozell LABOR, DO 03/16/24 [phone]

## 2024-03-24 ENCOUNTER — Other Ambulatory Visit: Payer: Self-pay

## 2024-03-24 VITALS — BP 139/77 | HR 76

## 2024-03-24 DIAGNOSIS — S82891D Other fracture of right lower leg, subsequent encounter for closed fracture with routine healing: Secondary | ICD-10-CM

## 2024-03-24 DIAGNOSIS — K5909 Other constipation: Secondary | ICD-10-CM | POA: Insufficient documentation

## 2024-03-24 DIAGNOSIS — D508 Other iron deficiency anemias: Secondary | ICD-10-CM | POA: Insufficient documentation

## 2024-03-24 DIAGNOSIS — I10 Essential (primary) hypertension: Secondary | ICD-10-CM

## 2024-03-24 NOTE — Assessment & Plan Note (Signed)
  Constipation due to surgery and oxycodone , causing discomfort and appetite loss. Diverticulitis not suspected. - Advise daily Miralax  (polyethylene glycol). - Provide stool softener sample. - Encourage high-fiber diet. - Recommend Miralax  with coffee, juice, or water.

## 2024-03-24 NOTE — Assessment & Plan Note (Signed)
 Anemia worsened by surgery and lack of iron  supplementation. - Prescribe iron  tablets (Ferrous gluconate ).

## 2024-03-24 NOTE — Patient Instructions (Signed)
 Visit Information  Thank you for taking time to visit with me today. Please don't hesitate to contact me if I can be of assistance to you before our next scheduled appointment.  Your next care management appointment is by telephone on Thursday, September 4 at 2:00 PM  Please call the care guide team at [phone] if you need to cancel, schedule, or reschedule an appointment.   Please call [phone]-TALK (toll free, 24 hour hotline) if you are experiencing a Mental Health or Behavioral Health Crisis or need someone to talk to.  Clayborne Ly RN BSN CCM [place]  [facility], [facility] Nurse Care Coordinator  Direct Dial: [phone] Website: [email or web address]

## 2024-03-24 NOTE — Patient Outreach (Signed)
 Complex Care Management   Visit Note  03/24/2024  Name:  [name] MRN: [id number] DOB: 04-30-1961  Situation: Referral received for Complex Care Management related to SDOH Barriers:  Housing living in a motel and Primary Hypertension, left eye Cataract, right closed ankle fracture, narcotic induced constipation.  I obtained verbal consent from Patient.  Visit completed with patient  on the phone.  Background:   Past Medical History:  Diagnosis Date   (HFpEF) heart failure with preserved ejection fraction (HCC) 05/15/2023   Adrenal adenoma 01/11/2021   Anemia    CHF (congestive heart failure) (HCC)    Chronic bronchitis (HCC)    probably once/yr (05/12/2013)   Chronic kidney disease    Diastolic heart failure    Dyslipidemia    pt denies this hx on 05/12/2013   Exertional shortness of breath    GERD (gastroesophageal reflux disease)    Heart murmur    Hypertension    Hypokalemia    Left ventricular hypertrophy 01/11/2021   Primary hypertension 05/12/2013   Venous stasis ulcers (HCC)     Assessment: Patient Reported Symptoms:  Cognitive Cognitive Status: Alert and oriented to person, place, and time Cognitive/Intellectual Conditions Management [RPT]: None reported or documented in medical history or problem list   Health Maintenance Behaviors: Annual physical exam, Healthy diet Healing Pattern: Slow Health Facilitated by: Pain control, Rest, Healthy diet  Neurological Neurological Review of Symptoms: No symptoms reported    HEENT HEENT Symptoms Reported: Other: (missing teeth on bottom, needs dentures) HEENT Management Strategies: Medical device, Routine screening HEENT Self-Management Outcome: 4 (good) HEENT Comment: patient has a Cataract to her left eye, sent PCP in basket message requesting an Opthalmology referral    Cardiovascular Cardiovascular Symptoms Reported: No symptoms reported Does patient have uncontrolled Hypertension?: Yes Is patient checking  Blood Pressure at home?: Yes Patient's Recent BP reading at home: 139/77 Cardiovascular Management Strategies: Medication therapy, Medical device, Routine screening Cardiovascular Self-Management Outcome: 4 (good)  Respiratory Respiratory Symptoms Reported: No symptoms reported    Endocrine Endocrine Symptoms Reported: No symptoms reported    Gastrointestinal Gastrointestinal Symptoms Reported: No symptoms reported      Genitourinary Genitourinary Symptoms Reported: No symptoms reported    Integumentary Integumentary Symptoms Reported: Wound Additional Integumentary Details: surgical wound to right ankle, patient is wearing a cast and being followed by Ortho Skin Management Strategies: Adequate rest, Routine screening, Medication therapy (DME and routine screening, case to right ankle)  Musculoskeletal Musculoskelatal Symptoms Reviewed: Difficulty walking, Limited mobility, Unsteady gait Additional Musculoskeletal Details: s/p revision right ankle fx Musculoskeletal Management Strategies: Medication therapy, Routine screening (elevating) Musculoskeletal Self-Management Outcome: 4 (good) Falls in the past year?: No Number of falls in past year: 1 or less Was there an injury with Fall?: No Fall Risk Category Calculator: 0 Patient Fall Risk Level: Low Fall Risk Patient at Risk for Falls Due to: Impaired balance/gait, Impaired mobility, Orthopedic patient Fall risk Follow up: Falls evaluation completed, Education provided  Psychosocial Psychosocial Symptoms Reported: No symptoms reported     Quality of Family Relationships: helpful, involved, supportive Do you feel physically threatened by others?: No      03/13/2024    2:26 PM  Depression screen PHQ 2/9  Decreased Interest 0  Down, Depressed, Hopeless 0  PHQ - 2 Score 0  Altered sleeping 0  Tired, decreased energy 0  Change in appetite 0  Feeling bad or failure about yourself  0  Trouble concentrating 0  Moving slowly or  fidgety/restless 0  Suicidal thoughts 0  PHQ-9 Score 0  Difficult doing work/chores Not difficult at all    Vitals:   03/24/24 1219  BP: 139/77  Pulse: 76    Medications Reviewed Today     Reviewed by Morgan Clayborne CROME, RN (Registered Nurse) on 03/24/24 at 1228  Med List Status: <None>   Medication Order Taking? Sig Documenting Provider Last Dose Status Informant  acetaminophen  (TYLENOL ) 325 MG tablet [id number] Yes Take 650 mg by mouth every 6 (six) hours as needed for mild pain or headache. [provider]  Active Self  amLODipine  (NORVASC ) 10 MG tablet [id number] Yes TAKE 1 TABLET(10 MG) BY MOUTH DAILY Petrina Pries, NP  Active Self  aspirin  (BAYER ASPIRIN ) 325 MG tablet [id number]  Take 1 tablet by mouth for 30 DAYS for blood clot prevention. Start post op day one [place], Jesse J, PA-C  Consider Medication Status and Discontinue (Change in therapy)   aspirin  EC 81 MG tablet [id number] Yes Take 81 mg by mouth daily. Swallow whole. [provider]  Active   atorvastatin  (LIPITOR) 80 MG tablet [id number] Yes Take 1 tablet (80 mg total) by mouth daily. Raford Riggs, MD  Active Self  carvedilol  (COREG ) 12.5 MG tablet [id number] Yes Take 1 tablet (12.5 mg total) by mouth 2 (two) times daily with a meal. Raford Riggs, MD  Active Self  cetirizine (ZYRTEC) 10 MG tablet [id number] Yes Take 10 mg by mouth daily as needed for allergies. [provider]  Active Self  FARXIGA  10 MG TABS tablet [id number]  Take 1 tablet (10 mg total) by mouth daily.  Patient not taking: Reported on 03/24/2024   Petrina Pries, NP  Active Self           Med Note MACK CASSIUS JINNY Stevan Nov 14, 2023  9:53 AM) Held my Nephrologist  furosemide  (LASIX ) 40 MG tablet [id number] Yes TAKE 1 TABLET(40 MG) BY MOUTH DAILY Georgina Speaks, FNP  Active   hydrALAZINE  (APRESOLINE ) 50 MG tablet [id number] Yes Take 1 tablet (50 mg total) by mouth 3 (three) times daily. Georgina Speaks, FNP  Active Self  Iron -FA-B  Cmp-C-Biot-Probiotic (FUSION PLUS) CAPS [id number] Yes Take 1 capsule by mouth daily. Petrina Pries, NP  Active   oxyCODONE  (ROXICODONE ) 5 MG immediate release tablet [id number]  Take 1 tablet (5 mg total) by mouth every 4 (four) hours as needed.  Patient not taking: Reported on 03/24/2024   [place], Jesse J, PA-C  Active   pantoprazole  (PROTONIX ) 40 MG tablet [id number] Yes Take 40 mg by mouth daily. [provider]  Active Self  polyethylene glycol (MIRALAX  / GLYCOLAX ) 17 g packet [id number] Yes Take 17 g by mouth daily as needed for mild constipation or moderate constipation. Petrina Pries, NP  Active   senna-docusate (SENOKOT-S) 8.6-50 MG tablet [id number]  Take 1 tablet by mouth daily.  Patient not taking: Reported on 03/24/2024   Amilibia, Jaden, DO  Active   sodium phosphate  (FLEET) CRETA [id number]  Place 399 mLs (3 enemas total) rectally daily as needed for up to 3 doses for severe constipation.  Patient not taking: Reported on 03/24/2024   Amilibia, Jaden, DO  Active   spironolactone  (ALDACTONE ) 25 MG tablet [id number]  Take 1 tablet (25 mg total) by mouth daily.  Patient not taking: Reported on 03/24/2024   Raford Riggs, MD  Active Self           Recommendation:   Specialty provider follow-up with Dr. Elsa for treatment of right closed  ankle fracture as directed   Follow Up Plan:   Telephone follow up appointment date/time:  Thursday, September 4 at 2:00 PM Referral to [facility] Marye Eagen RN BSN CCM   [facility], [facility] Nurse Care Coordinator  Direct Dial: [phone] Website: [email or web address]

## 2024-03-25 ENCOUNTER — Other Ambulatory Visit: Payer: Self-pay | Admitting: Nurse Practitioner

## 2024-03-26 ENCOUNTER — Other Ambulatory Visit: Admitting: Licensed Clinical Social Worker

## 2024-03-26 NOTE — Patient Outreach (Signed)
 Complex Care Management   Visit Note  03/26/2024  Name:  [name] MRN: [id number] DOB: [birth year]  Situation: Referral received for Complex Care Management related to SDOH Barriers:  Housing section 8 I obtained verbal consent from Patient.  Visit completed with patient  on the phone  Background:   Past Medical History:  Diagnosis Date   (HFpEF) heart failure with preserved ejection fraction (HCC) 05/15/2023   Adrenal adenoma 01/11/2021   Anemia    CHF (congestive heart failure) (HCC)    Chronic bronchitis (HCC)    probably once/yr (05/12/2013)   Chronic kidney disease    Diastolic heart failure    Dyslipidemia    pt denies this hx on 05/12/2013   Exertional shortness of breath    GERD (gastroesophageal reflux disease)    Heart murmur    Hypertension    Hypokalemia    Left ventricular hypertrophy 01/11/2021   Primary hypertension 05/12/2013   Venous stasis ulcers (HCC)     Assessment: Patient Reported Symptoms:  Cognitive        Neurological      HEENT        Cardiovascular      Respiratory      Endocrine      Gastrointestinal        Genitourinary      Integumentary      Musculoskeletal          Psychosocial              03/13/2024    2:26 PM  Depression screen PHQ 2/9  Decreased Interest 0  Down, Depressed, Hopeless 0  PHQ - 2 Score 0  Altered sleeping 0  Tired, decreased energy 0  Change in appetite 0  Feeling bad or failure about yourself  0  Trouble concentrating 0  Moving slowly or fidgety/restless 0  Suicidal thoughts 0  PHQ-9 Score 0  Difficult doing work/chores Not difficult at all    There were no vitals filed for this visit.  Medications Reviewed Today   Medications were not reviewed in this encounter     Recommendation:   none  Follow Up Plan:   Telephone follow up appointment date/time:  04/11/2024 at 10:00 am  Tobias CHARM Maranda HEDWIG, PhD [facility], [facility] Social  Worker Direct Dial: [phone]  Fax: [phone]

## 2024-03-26 NOTE — Patient Instructions (Signed)
 Visit Information  Thank you for taking time to visit with me today. Please don't hesitate to contact me if I can be of assistance to you before our next scheduled appointment.  Our next appointment is by telephone on 04/11/2024 at 10:00 am Please call the care guide team at [phone] if you need to cancel or reschedule your appointment.   Following is a copy of your care plan:   Goals Addressed             This Visit's Progress    BSW VBCI Social Work Care Plan       Problems:   Housing   CSW Clinical Goal(s):   Over the next 2 weeks the Patient will will follow up with [employer] and complete the application as directed by Social Work.  Interventions:  SW will mail the section 8 application and information   Patient Goals/Self-Care Activities:  Coordinate with [facility]  to assist with Section [employer] application.  Plan:   Telephone follow up appointment with care management team member scheduled for:  04/11/2024 at 10:00 am        Please call the Suicide and Crisis Lifeline: 988 go to [facility] [address], [place] [phone]) call 911 if you are experiencing a Mental Health or Behavioral Health Crisis or need someone to talk to.  The patient verbalized understanding of instructions, educational materials, and care plan provided today and DECLINED offer to receive copy of patient instructions, educational materials, and care plan.   [name] [name] Maranda HEDWIG, PhD [facility], [facility] Social Worker Direct Dial: [phone]  Fax: [phone]

## 2024-03-28 ENCOUNTER — Telehealth (HOSPITAL_BASED_OUTPATIENT_CLINIC_OR_DEPARTMENT_OTHER): Payer: Self-pay | Admitting: *Deleted

## 2024-03-28 DIAGNOSIS — E782 Mixed hyperlipidemia: Secondary | ICD-10-CM

## 2024-03-28 MED ORDER — ATORVASTATIN CALCIUM 80 MG PO TABS: 80.0000 mg | ORAL_TABLET | Freq: Every day | ORAL | 3 refills | Status: AC

## 2024-03-28 NOTE — Telephone Encounter (Signed)
 Refilled Atorvastatin , Spironolactone  refilled by Dr JINNY Ada

## 2024-03-28 NOTE — Telephone Encounter (Signed)
-----   Message from [facility] sent at 03/27/2024  5:04 PM EDT ----- Regarding: FW: patient is requesting refills  ----- Message ----- From: Morgan Clayborne CROME, RN Sent: 03/24/2024  12:45 PM EDT To: Annabella Scarce, MD Subject: RE: patient is requesting refills              Hello Dr. Scarce,   Patient reports she needs a new Rx sent to her pharmacy listed in [facility] for Atorvastatin  80 mg daily, she is down to 3 doses. She has been out of her Spironolactone  25 mg daily x 3 days. She stated the pharmacy told her there were no refills and she needed to check with her doctor. It looks like you are the prescribing MD for these meds so I wanted to make you aware. Please let me know if I can further assist.  Warmly,  Clayborne Morgan RN BSN CCM [place]  [facility], [facility] Nurse Care Coordinator  Direct Dial: [phone] Website: [email or web address]

## 2024-04-11 ENCOUNTER — Other Ambulatory Visit: Payer: Self-pay | Admitting: Licensed Clinical Social Worker

## 2024-04-11 NOTE — Patient Outreach (Signed)
 Complex Care Management   Visit Note  04/11/2024  Name:  [name] MRN: [id number] DOB: 1960/11/04  Situation: Referral received for Complex Care Management related to SDOH Barriers:  Housing section 8 application and housing information  I obtained verbal consent from [name].  Visit completed with [name]  on the phone  Background:   Past Medical History:  Diagnosis Date   (HFpEF) heart failure with preserved ejection fraction (HCC) 05/15/2023   Adrenal adenoma 01/11/2021   Anemia    CHF (congestive heart failure) (HCC)    Chronic bronchitis (HCC)    probably once/yr (05/12/2013)   Chronic kidney disease    Diastolic heart failure    Dyslipidemia    pt denies this hx on 05/12/2013   Exertional shortness of breath    GERD (gastroesophageal reflux disease)    Heart murmur    Hypertension    Hypokalemia    Left ventricular hypertrophy 01/11/2021   Primary hypertension 05/12/2013   Venous stasis ulcers (HCC)     Assessment: [name] stated that she is currently living in a hotel, she was once staying her daughter but could no longer stay there. [name] stated that she has net received the housing information sent by the SW. The SW another request for it to be mailed again and verified the address [address]  [place] [address]    SDOH Interventions    Flowsheet Row [name] Outreach Telephone from 03/26/2024 in [place] POPULATION HEALTH DEPARTMENT [name] Outreach Telephone from 03/24/2024 in [place] POPULATION HEALTH DEPARTMENT Clinical Support from 03/05/2024 in [facility] Care Coordination from 08/27/2023 in [facility] Care Coordination Care Coordination from 07/12/2023 in [facility] Care Coordination Care Coordination from 07/11/2023 in [facility] Care Coordination  SDOH Interventions        Food Insecurity Interventions Intervention Not Indicated --  Intervention Not Indicated Intervention Not Indicated Intervention Not Indicated --  Housing Interventions -- AMB Referral Intervention Not Indicated Intervention Not Indicated Other (Comment)  [[name] is currently living with her daughter and may need to move soon] AMB Referral  [SW referral sent to Tobias Moose BSW]  Transportation Interventions -- -- Intervention Not Indicated Intervention Not Indicated Intervention Not Indicated --  Utilities Interventions -- -- Intervention Not Indicated Intervention Not Indicated Intervention Not Indicated --  Alcohol  Usage Interventions -- -- Intervention Not Indicated (Score <7) -- -- --  Depression Interventions/Treatment  -- -- EYV7-0 Score <4 Follow-up Not Indicated -- -- --  Financial Strain Interventions -- -- Intervention Not Indicated -- -- --  Physical Activity Interventions -- -- [name] Declined -- -- --  Stress Interventions -- -- Intervention Not Indicated -- -- --  Social Connections Interventions -- -- Intervention Not Indicated -- -- --  Health Literacy Interventions -- -- Intervention Not Indicated -- -- --    Recommendation:   none  Follow Up Plan:   Telephone follow up appointment date/time:  04/28/2024 at 10  Tobias CHARM Moose HEDWIG, PhD [facility], [facility] Social Worker Direct Dial: [phone]  Fax: [phone]

## 2024-04-11 NOTE — Patient Instructions (Signed)
 Visit Information  Thank you for taking time to visit with me today. Please don't hesitate to contact me if I can be of assistance to you before our next scheduled appointment.  Your next care management appointment is by telephone on 04/28/2024 at 10:00 am    Please call the care guide team at [phone] if you need to cancel, schedule, or reschedule an appointment.   Please call the Suicide and Crisis Lifeline: 988 go to [facility] Urgent [facility] [address], [place] [phone]) call 911 if you are experiencing a Mental Health or Behavioral Health Crisis or need someone to talk to.  Tobias CHARM Maranda HEDWIG, PhD [facility], [facility] Social Worker Direct Dial: [phone]  Fax: [phone]

## 2024-04-24 ENCOUNTER — Other Ambulatory Visit: Payer: Self-pay

## 2024-04-24 NOTE — Patient Instructions (Signed)
 Visit Information  Thank you for taking time to visit with me today. Please don't hesitate to contact me if I can be of assistance to you before our next scheduled appointment.  Your next care management appointment is by telephone on Thursday, October 2 at 2:00 PM  Please call the care guide team at [phone] if you need to cancel, schedule, or reschedule an appointment.   Please call 1-800-273-TALK (toll free, 24 hour hotline) if you are experiencing a Mental Health or Behavioral Health Crisis or need someone to talk to.  Clayborne Ly RN BSN CCM [place]  [facility], [facility] Nurse Care Coordinator  Direct Dial: [phone] Website: [email or web address]

## 2024-04-24 NOTE — Patient Outreach (Signed)
 Complex Care Management   Visit Note  04/24/2024  Name:  [name] MRN: [id number] DOB: February 13, 1961  Situation: Referral received for Complex Care Management related to SDOH Barriers:  Housing living in a motel and Primary Hypertension, left eye Cataract, right closed ankle fracture, narcotic induced constipation. I obtained verbal consent from Patient.  Visit completed with Patient  on the phone.  Background:   Past Medical History:  Diagnosis Date   (HFpEF) heart failure with preserved ejection fraction (HCC) 05/15/2023   Adrenal adenoma 01/11/2021   Anemia    CHF (congestive heart failure) (HCC)    Chronic bronchitis (HCC)    probably once/yr (05/12/2013)   Chronic kidney disease    Diastolic heart failure    Dyslipidemia    pt denies this hx on 05/12/2013   Exertional shortness of breath    GERD (gastroesophageal reflux disease)    Heart murmur    Hypertension    Hypokalemia    Left ventricular hypertrophy 01/11/2021   Primary hypertension 05/12/2013   Venous stasis ulcers (HCC)     Assessment: Patient Reported Symptoms:  Cognitive Cognitive Status: Alert and oriented to person, place, and time, Normal speech and language skills Cognitive/Intellectual Conditions Management [RPT]: None reported or documented in medical history or problem list   Health Maintenance Behaviors: Annual physical exam Healing Pattern: Slow Health Facilitated by: Pain control, Rest  Neurological Neurological Review of Symptoms: Not assessed    HEENT HEENT Symptoms Reported: Not assessed      Cardiovascular Cardiovascular Symptoms Reported: No symptoms reported Does patient have uncontrolled Hypertension?: Yes Is patient checking Blood Pressure at home?: Yes Patient's Recent BP reading at home: 150's/80's Cardiovascular Management Strategies: Medication therapy, Routine screening Cardiovascular Self-Management Outcome: 4 (good)  Respiratory Respiratory Symptoms Reported: No symptoms  reported    Endocrine Endocrine Symptoms Reported: Not assessed    Gastrointestinal Gastrointestinal Symptoms Reported: No symptoms reported      Genitourinary Genitourinary Symptoms Reported: No symptoms reported    Integumentary Integumentary Symptoms Reported: No symptoms reported    Musculoskeletal Musculoskelatal Symptoms Reviewed: Difficulty walking, Limited mobility, Unsteady gait Additional Musculoskeletal Details: patient is s/p closed right ankle repair Musculoskeletal Management Strategies: Adequate rest, Routine screening, Medical device, Medication therapy Musculoskeletal Self-Management Outcome: 4 (good) Falls in the past year?: No Number of falls in past year: 1 or less Was there an injury with Fall?: No Fall Risk Category Calculator: 0 Patient Fall Risk Level: Low Fall Risk Patient at Risk for Falls Due to: Impaired balance/gait, Impaired mobility, History of fall(s) Fall risk Follow up: Falls evaluation completed, Education provided, Falls prevention discussed  Psychosocial Psychosocial Symptoms Reported: No symptoms reported     Quality of Family Relationships: helpful, involved, supportive Do you feel physically threatened by others?: No    04/24/2024    PHQ2-9 Depression Screening   Ange Puskas interest or pleasure in doing things    Feeling down, depressed, or hopeless    PHQ-2 - Total Score    Trouble falling or staying asleep, or sleeping too much    Feeling tired or having Antawn Sison energy    Poor appetite or overeating     Feeling bad about yourself - or that you are a failure or have let yourself or your family down    Trouble concentrating on things, such as reading the newspaper or watching television    Moving or speaking so slowly that other people could have noticed.  Or the opposite - being so fidgety or restless that  you have been moving around a lot more than usual    Thoughts that you would be better off dead, or hurting yourself in some way    PHQ2-9  Total Score    If you checked off any problems, how difficult have these problems made it for you to do your work, take care of things at home, or get along with other people    Depression Interventions/Treatment      There were no vitals filed for this visit.  Medications Reviewed Today     Reviewed by Morgan Clayborne CROME, RN (Registered Nurse) on 04/24/24 at 1419  Med List Status: <None>   Medication Order Taking? Sig Documenting Provider Last Dose Status Informant  acetaminophen  (TYLENOL ) 325 MG tablet [id number]  Take 650 mg by mouth every 6 (six) hours as needed for mild pain or headache. [provider]  Active Self  amLODipine  (NORVASC ) 10 MG tablet [id number]  TAKE 1 TABLET(10 MG) BY MOUTH DAILY Ohenhen, Pat, NP  Active Self  aspirin  (BAYER ASPIRIN ) 325 MG tablet [id number]  Take 1 tablet by mouth for 30 DAYS for blood clot prevention. Start post op day one [place], Jesse J, PA-C  Active   aspirin  EC 81 MG tablet [id number]  Take 81 mg by mouth daily. Swallow whole. [provider]  Active   atorvastatin  (LIPITOR) 80 MG tablet [id number]  Take 1 tablet (80 mg total) by mouth daily. Raford Riggs, MD  Active   carvedilol  (COREG ) 12.5 MG tablet [id number]  Take 1 tablet (12.5 mg total) by mouth 2 (two) times daily with a meal. Raford Riggs, MD  Active Self  cetirizine (ZYRTEC) 10 MG tablet [id number]  Take 10 mg by mouth daily as needed for allergies. [provider]  Active Self  FARXIGA  10 MG TABS tablet [id number]  Take 1 tablet (10 mg total) by mouth daily.  Patient not taking: Reported on 03/24/2024   Petrina Pries, NP  Active Self           Med Note MACK CASSIUS JINNY Stevan Nov 14, 2023  9:53 AM) Held my Nephrologist  furosemide  (LASIX ) 40 MG tablet [id number]  TAKE 1 TABLET(40 MG) BY MOUTH DAILY Georgina Speaks, FNP  Active   hydrALAZINE  (APRESOLINE ) 50 MG tablet [id number]  Take 1 tablet (50 mg total) by mouth 3 (three) times daily. Georgina Speaks, FNP  Active Self   Iron -FA-B Cmp-C-Biot-Probiotic (FUSION PLUS) CAPS [id number]  Take 1 capsule by mouth daily. Petrina Pries, NP  Active   oxyCODONE  (ROXICODONE ) 5 MG immediate release tablet [id number]  Take 1 tablet (5 mg total) by mouth every 4 (four) hours as needed.  Patient not taking: Reported on 03/24/2024   [place], Jesse J, PA-C  Active   pantoprazole  (PROTONIX ) 40 MG tablet [id number]  Take 40 mg by mouth daily. [provider]  Active Self  polyethylene glycol (MIRALAX  / GLYCOLAX ) 17 g packet [id number]  Take 17 g by mouth daily as needed for mild constipation or moderate constipation. Petrina Pries, NP  Active   senna-docusate (SENOKOT-S) 8.6-50 MG tablet [id number]  Take 1 tablet by mouth daily.  Patient not taking: Reported on 03/24/2024   Amilibia, Jaden, DO  Active   sodium phosphate  (FLEET) CRETA [id number]  Place 399 mLs (3 enemas total) rectally daily as needed for up to 3 doses for severe constipation.  Patient not taking: Reported on 03/24/2024   Amilibia, Jaden, DO  Active   spironolactone  (ALDACTONE ) 25 MG tablet [id number]  TAKE 1 TABLET BY MOUTH DAILY Georgina Speaks, FNP  Active             Recommendation:   Specialty provider follow-up with Dr. Elsa for orthopedic follow up as directed  Specialty provider follow-up with Dr. Dennise for kidney recheck as directed  Specialty provider follow-up with Dr. Annabella Scarce on 06/05/24 at 3:30 PM for a BP recheck   Follow Up Plan:   Telephone follow up appointment with social worker Tobias Moose BSW scheduled for date/time: 04/28/24 at 10:00 AM Telephone follow up appointment with nurse care manager date/time:  Thursday, October 2 at 2:00 PM  Clayborne Ly RN BSN CCM [place]  [facility], [facility] Nurse Care Coordinator  Direct Dial: [phone] Website: [email or web address]

## 2024-04-28 ENCOUNTER — Other Ambulatory Visit: Payer: Self-pay | Admitting: Licensed Clinical Social Worker

## 2024-04-28 NOTE — Patient Instructions (Signed)
 Visit Information  Thank you for taking time to visit with me today. Please don't hesitate to contact me if I can be of assistance to you before our next scheduled appointment.  Your next care management appointment is by telephone on 05/19/2024 at 10:00 am   Please call the care guide team at [phone] if you need to cancel, schedule, or reschedule an appointment.   Please call the Suicide and Crisis Lifeline: 988 go to [facility] Urgent [facility] [address] [place] [phone]) call 911 if you are experiencing a Mental Health or Behavioral Health Crisis or need someone to talk to.  [name] Summer HEDWIG, PhD [facility], [facility] Social Worker Direct Dial: [phone]  Fax: [phone]

## 2024-04-28 NOTE — Patient Outreach (Signed)
 Complex Care Management   Visit Note  04/28/2024  Name:  [name] MRN: [id number] DOB: 03-11-1961  Situation: Referral received for Complex Care Management related to SDOH Barriers:  Housing Section 8 application never received SW will mail back I obtained verbal consent from Patient.  Visit completed with Patient  on the phone  Background:   Past Medical History:  Diagnosis Date   (HFpEF) heart failure with preserved ejection fraction (HCC) 05/15/2023   Adrenal adenoma 01/11/2021   Anemia    CHF (congestive heart failure) (HCC)    Chronic bronchitis (HCC)    probably once/yr (05/12/2013)   Chronic kidney disease    Diastolic heart failure    Dyslipidemia    pt denies this hx on 05/12/2013   Exertional shortness of breath    GERD (gastroesophageal reflux disease)    Heart murmur    Hypertension    Hypokalemia    Left ventricular hypertrophy 01/11/2021   Primary hypertension 05/12/2013   Venous stasis ulcers (HCC)     Assessment: Patient is stil looking for  housing and received the housing list but not the section 8 application , SW will have the application mailed back out    SDOH Interventions    Flowsheet Row Patient Outreach Telephone from 03/26/2024 in [place] POPULATION HEALTH DEPARTMENT Patient Outreach Telephone from 03/24/2024 in [place] POPULATION HEALTH DEPARTMENT Clinical Support from 03/05/2024 in [place] Care Coordination from 08/27/2023 in [facility] Care Coordination Care Coordination from 07/12/2023 in [facility] Care Coordination Care Coordination from 07/11/2023 in [facility] Care Coordination  SDOH Interventions        Food Insecurity Interventions Intervention Not Indicated -- Intervention Not Indicated Intervention Not Indicated Intervention Not Indicated --  Housing Interventions -- AMB Referral Intervention Not Indicated Intervention Not  Indicated Other (Comment)  [Patient is currently living with her daughter and may need to move soon] AMB Referral  [SW referral sent to Tobias Moose BSW]  Transportation Interventions -- -- Intervention Not Indicated Intervention Not Indicated Intervention Not Indicated --  Utilities Interventions -- -- Intervention Not Indicated Intervention Not Indicated Intervention Not Indicated --  Alcohol  Usage Interventions -- -- Intervention Not Indicated (Score <7) -- -- --  Depression Interventions/Treatment  -- -- EYV7-0 Score <4 Follow-up Not Indicated -- -- --  Financial Strain Interventions -- -- Intervention Not Indicated -- -- --  Physical Activity Interventions -- -- Patient Declined -- -- --  Stress Interventions -- -- Intervention Not Indicated -- -- --  Social Connections Interventions -- -- Intervention Not Indicated -- -- --  Health Literacy Interventions -- -- Intervention Not Indicated -- -- --      Recommendation:   none  Follow Up Plan:   Telephone follow up appointment date/time:  05/19/2024 at 10:00 am  Tobias CHARM Moose HEDWIG, PhD [facility], [facility] Social Worker Direct Dial: [phone]  Fax: [phone]

## 2024-05-01 DIAGNOSIS — E269 Hyperaldosteronism, unspecified: Secondary | ICD-10-CM | POA: Diagnosis not present

## 2024-05-01 DIAGNOSIS — D631 Anemia in chronic kidney disease: Secondary | ICD-10-CM | POA: Diagnosis not present

## 2024-05-01 DIAGNOSIS — I129 Hypertensive chronic kidney disease with stage 1 through stage 4 chronic kidney disease, or unspecified chronic kidney disease: Secondary | ICD-10-CM | POA: Diagnosis not present

## 2024-05-01 DIAGNOSIS — E785 Hyperlipidemia, unspecified: Secondary | ICD-10-CM | POA: Diagnosis not present

## 2024-05-01 DIAGNOSIS — I509 Heart failure, unspecified: Secondary | ICD-10-CM | POA: Diagnosis not present

## 2024-05-01 DIAGNOSIS — N2581 Secondary hyperparathyroidism of renal origin: Secondary | ICD-10-CM | POA: Diagnosis not present

## 2024-05-02 LAB — LAB REPORT - SCANNED: EGFR: 21

## 2024-05-12 ENCOUNTER — Encounter (HOSPITAL_BASED_OUTPATIENT_CLINIC_OR_DEPARTMENT_OTHER): Payer: Self-pay | Admitting: Cardiovascular Disease

## 2024-05-12 ENCOUNTER — Ambulatory Visit (INDEPENDENT_AMBULATORY_CARE_PROVIDER_SITE_OTHER): Admitting: Cardiovascular Disease

## 2024-05-12 VITALS — BP 124/68 | HR 75 | Resp 17 | Ht 68.0 in | Wt 233.0 lb

## 2024-05-12 DIAGNOSIS — E782 Mixed hyperlipidemia: Secondary | ICD-10-CM | POA: Diagnosis not present

## 2024-05-12 DIAGNOSIS — Z5181 Encounter for therapeutic drug level monitoring: Secondary | ICD-10-CM

## 2024-05-12 DIAGNOSIS — I10 Essential (primary) hypertension: Secondary | ICD-10-CM | POA: Diagnosis not present

## 2024-05-12 NOTE — Patient Instructions (Signed)
 Medication Instructions:  Your physician recommends that you continue on your current medications as directed. Please refer to the Current Medication list given to you today.   Labwork: FASTING LP/CMET IN [place]   Testing/Procedures: NONE  Follow-Up: WITH DR [place] OR CAITLIN W NP AFTER LABS IN [place]  If you need a refill on your cardiac medications before your next appointment, please call your pharmacy.

## 2024-05-12 NOTE — Progress Notes (Signed)
 Advanced Hypertension Clinic Follow-up:    Date:  05/12/2024   ID:  [name], DOB 12-11-1960, MRN [id number]  PCP:  Georgina Speaks, FNP  Cardiologist:  Annabella Scarce, MD  Nephrologist: Dr. Dennise, [facility]  Referring MD: Georgina Speaks, FNP   CC: Hypertension  History of Present Illness:    [name] is a 63 y.o. female with a hx of anemia, chronic diastolic heart failure, CKD 4, adrenal adenoma,  hyperaldosteronism, chronic bronchitis, GERD, hypertension, hypokalemia, venous stasis ulcers, and tobacco use, here for follow up.  She first established care in the hypertension clinic 01/11/2021.  She was seen in the ED 11/2020 with a blood pressure of 213/88. She had been drinking heavily and was experiencing palpitations. She previously had an echo 08/2018 with LVEF 60-65% and grade 1 diastolic dysfunction. She had severe LVH. When she last saw her PCP 10/2020, her blood pressure was uncontrolled and she was referred to cardiology.  She reported fatigue, and believed this is due to Norvasc . She has had hypertension since she was in her 43's. In 2018 she was noted to have an adrenal adenoma.  Labs at that time were consistent with hyperaldosteronism.  She was referred to Dr. Henrey at [facility].  She was seen at [facility] and underwent adrenal vein sampling 02/2021 which was consistent with hypersecretion of aldosterone on the right. She confirms that she never proceeded with laparascopic R adrenalectomy.  At her visit 05/2023 she was recently hospitalized for 6 days with pericardial effusion, diverticulitis.  At follow up she was slowing improving.  Carvedilol  and atorvastatin  were increased.  She remained unsure about removing her adrenal adenoma.  She was admitted 07/2023 with acute on chronic HFpEF treated with IV lasix .  She was seen 08/2023 and noted persistent LE edema.  She stopped taking hydralazine  and spironolactone  due to intolerance.  She had been out of furosemide  and it was  increased temporarily due to edema.  At her visit 10/2023 BP was controlled in the office and high at home.  Her nephrologist discontinued Farxiga  2/2 worsening renal function.   Discussed the use of AI scribe software for clinical note transcription with the patient, who gave verbal consent to proceed.  History of Present Illness [name] experiences significant difficulty sleeping, unable to fall asleep until around 6 AM. Despite trying melatonin, it has not been effective. Her blood pressure medication causes daytime drowsiness, yet she continues to struggle with insomnia at night.  She has a history of hypertension and is currently on multiple medications including hydralazine  three times a day, carvedilol , amlodipine  once daily, spironolactone , and atorvastatin  for cholesterol management. Her blood pressure readings have been around 137/70 mmHg and 142/62 mmHg, which she monitors regularly at home with similar results. She receives regular calls from her primary doctor's office to check her blood pressure as part of chronic care management.  She has an adrenal adenoma, identified as 4.2 cm in size. Prior imaging studies, including a CT scan, have shown the adenoma pressing on her kidneys.  She has undergone three surgeries on her ankle following an injury at work last year, with the most recent surgery addressing a misplacement of a screw. 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  Additionally, she reports having a cataract in her left eye, causing frequent watering. She plans to follow up with an eye doctor for further evaluation and  management.  No chest pain or pressure, and breathing is okay.   Previous antihypertensives: Lisinopril   Past Medical History:  Diagnosis Date   (HFpEF) heart failure with preserved ejection fraction (HCC) 05/15/2023    Adrenal adenoma 01/11/2021   Anemia    CHF (congestive heart failure) (HCC)    Chronic bronchitis (HCC)    probably once/yr (05/12/2013)   Chronic kidney disease    Diastolic heart failure    Dyslipidemia    pt denies this hx on 05/12/2013   Exertional shortness of breath    GERD (gastroesophageal reflux disease)    Heart murmur    Hypertension    Hypokalemia    Left ventricular hypertrophy 01/11/2021   Primary hypertension 05/12/2013   Venous stasis ulcers (HCC)     Past Surgical History:  Procedure Laterality Date   ANKLE FUSION Right 02/19/2024   Procedure: RIGHT ANKLE ARTHRODESIS;  Surgeon: Elsa Lonni SAUNDERS, MD;  Location: [facility] OR;  Service: Orthopedics;  Laterality: Right;   BIOPSY  02/23/2023   Procedure: BIOPSY;  Surgeon: Wilhelmenia Aloha Raddle., MD;  Location: [facility] ENDOSCOPY;  Service: Gastroenterology;;   COLONOSCOPY N/A 02/23/2023   Procedure: COLONOSCOPY;  Surgeon: Wilhelmenia Aloha Raddle., MD;  Location: [facility] ENDOSCOPY;  Service: Gastroenterology;  Laterality: N/A;   ESOPHAGOGASTRODUODENOSCOPY N/A 02/23/2023   Procedure: ESOPHAGOGASTRODUODENOSCOPY (EGD);  Surgeon: Wilhelmenia Aloha Raddle., MD;  Location: [facility] ENDOSCOPY;  Service: Gastroenterology;  Laterality: N/A;   HARDWARE REMOVAL Right 05/08/2023   Procedure: HARDWARE REMOVAL MEDIAL AND LATERAL;  Surgeon: Elsa Lonni SAUNDERS, MD;  Location: [facility] ORS;  Service: Orthopedics;  Laterality: Right;   HARDWARE REMOVAL Right 02/19/2024   Procedure: HARDWARE REMOVAL RIGHT ANKLE;  Surgeon: Elsa Lonni SAUNDERS, MD;  Location: [facility] OR;  Service: Orthopedics;  Laterality: Right;   HOT HEMOSTASIS N/A 02/23/2023   Procedure: HOT HEMOSTASIS (ARGON PLASMA COAGULATION/BICAP);  Surgeon: Wilhelmenia Aloha Raddle., MD;  Location: [facility] ENDOSCOPY;  Service: Gastroenterology;  Laterality: N/A;   IR US  GUIDE VASC ACCESS RIGHT  02/27/2017   IR VENOGRAM ADRENAL BI  02/27/2017   IR VENOGRAM HEPATIC WO HEMODYNAMIC EVALUATION  02/27/2017   IR VENOGRAM RENAL BI   02/27/2017   IR VENOUS SAMPLING  02/27/2017   IR VENOUS SAMPLING  02/27/2017   ORIF ANKLE FRACTURE Right 04/18/2023   Procedure: OPEN REDUCTION INTERNAL FIXATION (ORIF) ANKLE FRACTURE;  Surgeon: Edna Toribio LABOR, MD;  Location: [facility] ORS;  Service: Orthopedics;  Laterality: Right;   ORIF ANKLE FRACTURE Right 05/08/2023   Procedure: REVISION OPEN REDUCTION INTERNAL FIXATION (ORIF) OF RIGHT TRIMALLEOLAR ANKLE FRACTURE, OPEN TREATMENT OF SYNDESMOSIS;  Surgeon: Elsa Lonni SAUNDERS, MD;  Location: [facility] ORS;  Service: Orthopedics;  Laterality: Right;   POLYPECTOMY  02/23/2023   Procedure: POLYPECTOMY;  Surgeon: Wilhelmenia Aloha Raddle., MD;  Location: [facility] ENDOSCOPY;  Service: Gastroenterology;;   SYNDESMOSIS REPAIR Right 05/08/2023   Procedure: SYNDESMOSIS REPAIR;  Surgeon: Elsa Lonni SAUNDERS, MD;  Location: [facility] ORS;  Service: Orthopedics;  Laterality: Right;   VAGINAL HYSTERECTOMY  1990's    Current Medications: Current Meds  Medication Sig   acetaminophen  (TYLENOL ) 325 MG tablet Take 650 mg by mouth every 6 (six) hours as needed for mild pain or headache.   amLODipine  (NORVASC ) 10 MG tablet TAKE 1 TABLET(10 MG) BY MOUTH DAILY   aspirin  (BAYER ASPIRIN ) 325 MG tablet Take 1 tablet by mouth for 30 DAYS for blood clot prevention. Start post op day one   atorvastatin  (LIPITOR) 80 MG tablet Take 1 tablet (80 mg total) by mouth daily.  carvedilol  (COREG ) 12.5 MG tablet Take 1 tablet (12.5 mg total) by mouth 2 (two) times daily with a meal.   cetirizine (ZYRTEC) 10 MG tablet Take 10 mg by mouth daily as needed for allergies.   cyclobenzaprine  (FEXMID ) 7.5 MG tablet Take 7.5 mg by mouth 3 (three) times daily as needed.   Diclofenac Sodium 3 % GEL SMARTSIG:1-2 Gram(s) Topical 3-4 Times Daily   furosemide  (LASIX ) 40 MG tablet TAKE 1 TABLET(40 MG) BY MOUTH DAILY   hydrALAZINE  (APRESOLINE ) 50 MG tablet Take 1 tablet (50 mg total) by mouth 3 (three) times daily.   Iron -FA-B Cmp-C-Biot-Probiotic (FUSION PLUS) CAPS  Take 1 capsule by mouth daily.   pantoprazole  (PROTONIX ) 40 MG tablet Take 40 mg by mouth daily.   polyethylene glycol (MIRALAX  / GLYCOLAX ) 17 g packet Take 17 g by mouth daily as needed for mild constipation or moderate constipation.   spironolactone  (ALDACTONE ) 25 MG tablet TAKE 1 TABLET BY MOUTH DAILY     Allergies:   Oxycodone  and Lisinopril    Social History   Socioeconomic History   Marital status: Single    Spouse name: Not on file   Number of children: 5   Years of education: Not on file   Highest education level: Not on file  Occupational History   Occupation: disability  Tobacco Use   Smoking status: Every Day    Current packs/day: 0.50    Average packs/day: 0.5 packs/day for 10.0 years (5.0 ttl pk-yrs)    Types: Cigarettes   Smokeless tobacco: Never   Tobacco comments:    trying to cut back, 3/29 - only had one cigarette today, average 4 a day    07/25/23 cut back to 1 a day  Vaping Use   Vaping status: Never Used  Substance and Sexual Activity   Alcohol  use: Not Currently    Comment: stopped last August   Drug use: Yes    Types: Oxycodone     Comment: 05/12/2013 tried it a few days ago; didn't like it   Sexual activity: Not Currently  Other Topics Concern   Not on file  Social History Narrative   Not on file   Social Drivers of Health   Financial Resource Strain: Low Risk  (03/05/2024)   Overall Financial Resource Strain (CARDIA)    Difficulty of Paying Living Expenses: Not hard at all  Food Insecurity: No Food Insecurity (03/26/2024)   Hunger Vital Sign    Worried About Running Out of Food in the Last Year: Never true    Ran Out of Food in the Last Year: Never true  Transportation Needs: No Transportation Needs (03/05/2024)   PRAPARE - Administrator, Civil Service (Medical): No    Lack of Transportation (Non-Medical): No  Physical Activity: Inactive (03/05/2024)   Exercise Vital Sign    Days of Exercise per Week: 0 days    Minutes of Exercise  per Session: 0 min  Stress: No Stress Concern Present (03/05/2024)   [employer] of Occupational Health - Occupational Stress Questionnaire    Feeling of Stress: Not at all  Social Connections: Moderately Integrated (03/05/2024)   Social Connection and Isolation Panel    Frequency of Communication with Friends and Family: More than three times a week    Frequency of Social Gatherings with Friends and Family: Twice a week    Attends Religious Services: More than 4 times per year    Active Member of [employer] or Organizations: Yes    Attends [employer]  or Organization Meetings: More than 4 times per year    Marital Status: Never married     Family History: The patient's family history includes Diabetes in her sister and sister; Heart attack in her father; Hypertension in her brother, mother, and sister; Kidney failure in her mother.  ROS:   Please see the history of present illness.    (+) Prior fall 03/2023 with right ankle fracture (+) Generalized soreness/myalgias All other systems reviewed and are negative.  EKGs/Labs/Other Studies Reviewed:    Echo  05/16/2023:  1. Left ventricular ejection fraction, by estimation, is 60 to 65%. The  left ventricle has normal function. The left ventricle has no regional  wall motion abnormalities. There is severe concentric left ventricular  hypertrophy. Left ventricular diastolic   parameters are consistent with Grade I diastolic dysfunction (impaired  relaxation). Elevated left ventricular end-diastolic pressure.   2. Right ventricular systolic function is normal. The right ventricular  size is normal. Tricuspid regurgitation signal is inadequate for assessing  PA pressure.   3. Left atrial size was severely dilated.   4. Right atrial size was mildly dilated.   5. The mitral valve is normal in structure. No evidence of mitral valve  regurgitation. No evidence of mitral stenosis.   6. The aortic valve is tricuspid. Aortic valve regurgitation is  mild.  Aortic valve sclerosis/calcification is present, without any evidence of  aortic stenosis.   7. The inferior vena cava is normal in size with greater than 50%  respiratory variability, suggesting right atrial pressure of 3 mmHg.   US  Arterial Seg Multiple LE 11/05/2020: Normal exam. No evidence of hemodynamically significant peripheral arterial disease.  Echo TTE 09/06/2018: - Left ventricle: The cavity size was normal. Wall thickness was    increased in a pattern of severe LVH. Systolic function was    normal. The estimated ejection fraction was in the range of 60%    to 65%. Wall motion was normal; there were no regional wall    motion abnormalities. Doppler parameters are consistent with    abnormal left ventricular relaxation (grade 1 diastolic    dysfunction).  - Aortic valve: There was no stenosis. There was trivial    regurgitation.  - Mitral valve: There was trivial regurgitation.  - Left atrium: The atrium was moderately dilated.  - Right ventricle: The cavity size was normal. Systolic function    was normal.  - Right atrium: The atrium was mildly dilated.  - Pulmonary arteries: No complete TR doppler jet so unable to    estimate PA systolic pressure.  - Inferior vena cava: The vessel was normal in size. The    respirophasic diameter changes were in the normal range (>= 50%),    consistent with normal central venous pressure.   Impressions:  - Normal LV size with severe LV hypertrophy. EF 60-65%. Moderate    LAE. Normal RV size and systolic function. No significant    valvular abnormalities. Cardiac amyloidosis is a consideration.    EKG:  EKG is personally reviewed. 06/06/2023:  Not ordered. 01/11/2021: Not ordered.  Recent Labs: 07/26/2023: Magnesium  2.2 03/14/2024: ALT 16; B Natriuretic Peptide 83.4; BUN 37; Creatinine, Ser 2.79; Hemoglobin 9.2; Platelets 402; Potassium 4.1; Sodium 139   Recent Lipid Panel    Component Value Date/Time   CHOL 129 09/06/2023  1319   TRIG 46 09/06/2023 1319   HDL 45 09/06/2023 1319   CHOLHDL 2.9 09/06/2023 1319   CHOLHDL 4.9 05/13/2013 0437   VLDL  20 05/13/2013 0437   LDLCALC 73 09/06/2023 1319    Physical Exam:    VS:  BP 124/68   Pulse 75   Resp 17   Ht 5' 8 (1.727 m)   Wt 233 lb (105.7 kg)   SpO2 98%   BMI 35.43 kg/m  , BMI Body mass index is 35.43 kg/m. GENERAL:  Well appearing; ambulating independently in wheelchair HEENT: Pupils equal round and reactive, fundi not visualized, oral mucosa unremarkable NECK:  No jugular venous distention, waveform within normal limits, carotid upstroke brisk and symmetric, no bruits, no thyromegaly LUNGS:  Clear to auscultation bilaterally HEART:  RRR.  PMI not displaced or sustained,S1 and S2 within normal limits, no S3, no S4, no clicks, no rubs, no murmurs ABD:  Flat, positive bowel sounds normal in frequency in pitch, no bruits, no rebound, no guarding, no midline pulsatile mass, no hepatomegaly, no splenomegaly EXT:  2 plus pulses throughout, + left LE edema, right LE in cast, no cyanosis no clubbing SKIN:  No rashes no nodules NEURO:  Cranial nerves II through XII grossly intact, motor grossly intact throughout PSYCH:  Cognitively intact, oriented to person place and time  ASSESSMENT/PLAN:    Assessment & Plan # Secondary Hypertension in the setting of adrenal adenoma Hypertension partially controlled with current medications. Adrenal adenoma likely contributing to elevated blood pressure. Surgical removal planned. - Continue hydralazine , carvedilol , amlodipine , spironolactone . - Her nephrologist is referring her for a local surgeon for excision.  Previously referred to [facility] but she is unable to travel that far.  # Hyperlipidemia Managed with atorvastatin . Cholesterol levels satisfactory as of January. - Repeat fasting lipid panel and comprehensive metabolic panel at year's start.  # CKD IV: Continue with BP management as above and nephrology f/u.     Screening for Secondary Hypertension:     01/11/2021    3:35 PM  Causes  Drugs/Herbals Screened  Sleep Apnea Screened     - Comments snores  Thyroid Disease Screened  Hyperaldosteronism Screened     - Comments 2018    Relevant Labs/Studies:    Latest Ref Rng & Units 03/14/2024   11:02 AM 02/08/2024   10:00 AM 09/06/2023    1:19 PM  Basic Labs  Sodium 135 - 145 mmol/L 139  138  141   Potassium 3.5 - 5.1 mmol/L 4.1  4.5  5.3   Creatinine 0.44 - 1.00 mg/dL 7.20  7.90  7.38        Latest Ref Rng & Units 01/11/2021    3:52 PM 02/27/2019   11:29 AM  Thyroid   TSH 0.450 - 4.500 uIU/mL 0.744  0.665        Latest Ref Rng & Units 01/11/2021    3:52 PM 02/27/2017   11:23 AM 02/27/2017   11:22 AM 02/27/2017   11:20 AM 02/27/2017   11:16 AM 02/27/2017   11:14 AM 02/27/2017   10:53 AM  Renin/Aldosterone   Aldosterone 0.0 - 30.0 ng/dL 89.3  76.2  84.0  47.1  <1.0  5,161.8  1.4   Renin 0.167 - 5.380 ng/mL/hr 1.455         Aldos/Renin Ratio 0.0 - 30.0 7.3                 Latest Ref Rng & Units 02/27/2017   11:23 AM 02/27/2017   11:22 AM 02/27/2017   11:20 AM 02/27/2017   11:16 AM 02/27/2017   11:14 AM 02/27/2017   10:53 AM 02/27/2017  10:40 AM  Cortisol  Cortisol  ug/dL 83.0  80.4  80.0  85.3  >100.0  5.0  23.6      Disposition:    FU with Advanced HTN clinic in 4 months.  Medication Adjustments/Labs and Tests Ordered: Current medicines are reviewed at length with the patient today.  Concerns regarding medicines are outlined above.   Orders Placed This Encounter  Procedures   Lipid panel   Comprehensive metabolic panel with GFR   No orders of the defined types were placed in this encounter.   Signed, Annabella Scarce, MD  05/12/2024 1:57 PM    [facility]

## 2024-05-19 ENCOUNTER — Other Ambulatory Visit: Payer: Self-pay | Admitting: Licensed Clinical Social Worker

## 2024-05-19 NOTE — Patient Instructions (Signed)

## 2024-05-19 NOTE — Patient Outreach (Signed)
 Complex Care Management   Visit Note  05/19/2024  Name:  [name] MRN: [id number] DOB: 03-Mar-1961  Situation: Referral received for Complex Care Management related to SDOH Barriers:  Housing Section application I obtained verbal consent from Patient.  Visit completed with Patient  on the phone  Background:   Past Medical History:  Diagnosis Date   (HFpEF) heart failure with preserved ejection fraction (HCC) 05/15/2023   Adrenal adenoma 01/11/2021   Anemia    CHF (congestive heart failure) (HCC)    Chronic bronchitis (HCC)    probably once/yr (05/12/2013)   Chronic kidney disease    Diastolic heart failure    Dyslipidemia    pt denies this hx on 05/12/2013   Exertional shortness of breath    GERD (gastroesophageal reflux disease)    Heart murmur    Hypertension    Hypokalemia    Left ventricular hypertrophy 01/11/2021   Primary hypertension 05/12/2013   Venous stasis ulcers (HCC)     Assessment: Patient is staring Physical therapy today and she also completed the section 8 application and submitted it to [facility], no other SDOH needs. SW will close out case and notified the [facility].  SDOH Interventions    Flowsheet Row Patient Outreach from 05/19/2024 in [place] POPULATION HEALTH DEPARTMENT Patient Outreach Telephone from 03/26/2024 in [facility] POPULATION HEALTH DEPARTMENT Patient Outreach Telephone from 03/24/2024 in  POPULATION HEALTH DEPARTMENT Clinical Support from 03/05/2024 in [facility] Care Coordination from 08/27/2023 in [facility] Coordination Care Coordination from 07/12/2023 in [facility] Care Coordination  SDOH Interventions        Food Insecurity Interventions Intervention Not Indicated Intervention Not Indicated -- Intervention Not Indicated Intervention Not Indicated Intervention Not Indicated  Housing Interventions Intervention Not Indicated  [Patient completed  section application] -- AMB Referral Intervention Not Indicated Intervention Not Indicated Other (Comment)  [Patient is currently living with her daughter and may need to move soon]  Transportation Interventions Intervention Not Indicated -- -- Intervention Not Indicated Intervention Not Indicated Intervention Not Indicated  Utilities Interventions Intervention Not Indicated -- -- Intervention Not Indicated Intervention Not Indicated Intervention Not Indicated  Alcohol  Usage Interventions -- -- -- Intervention Not Indicated (Score <7) -- --  Depression Interventions/Treatment  -- -- -- PHQ2-9 Score <4 Follow-up Not Indicated -- --  Financial Strain Interventions -- -- -- Intervention Not Indicated -- --  Physical Activity Interventions -- -- -- Patient Declined -- --  Stress Interventions -- -- -- Intervention Not Indicated -- --  Social Connections Interventions -- -- -- Intervention Not Indicated -- --  Health Literacy Interventions -- -- -- Intervention Not Indicated -- --       Recommendation:   none  Follow Up Plan:   Closing From:  Complex Care Management  Tobias CHARM Maranda HEDWIG, PhD [facility], [facility] Social Worker Direct Dial: [phone]  Fax: [phone]

## 2024-05-22 ENCOUNTER — Other Ambulatory Visit: Payer: Self-pay

## 2024-05-22 NOTE — Patient Outreach (Signed)
 Complex Care Management   Visit Note  05/22/2024  Name:  [name] MRN: [id number] DOB: 04-May-1961  Situation: Referral received for Complex Care Management related to SDOH Barriers:  Housing living in a motel and Primary Hypertension, left eye Cataract, s/p right closed ankle fracture, Adrenal adenoma with Hyperaldosteronism, CKD stage IV. I obtained verbal consent from Patient.  Visit completed with Patient on the phone.  Background:   Past Medical History:  Diagnosis Date   (HFpEF) heart failure with preserved ejection fraction (HCC) 05/15/2023   Adrenal adenoma 01/11/2021   Anemia    CHF (congestive heart failure) (HCC)    Chronic bronchitis (HCC)    probably once/yr (05/12/2013)   Chronic kidney disease    Diastolic heart failure    Dyslipidemia    pt denies this hx on 05/12/2013   Exertional shortness of breath    GERD (gastroesophageal reflux disease)    Heart murmur    Hypertension    Hypokalemia    Left ventricular hypertrophy 01/11/2021   Primary hypertension 05/12/2013   Venous stasis ulcers (HCC)     Assessment: Patient Reported Symptoms:  Cognitive Cognitive Status: Alert and oriented to person, place, and time, Normal speech and language skills Cognitive/Intellectual Conditions Management [RPT]: None reported or documented in medical history or problem list   Health Maintenance Behaviors: Annual physical exam  Neurological Neurological Review of Symptoms: No symptoms reported    HEENT HEENT Symptoms Reported: Not assessed      Cardiovascular Cardiovascular Symptoms Reported: No symptoms reported Does patient have uncontrolled Hypertension?: Yes Is patient checking Blood Pressure at home?: Yes Cardiovascular Management Strategies: Routine screening, Medication therapy Cardiovascular Self-Management Outcome: 4 (good)  Respiratory Respiratory Symptoms Reported: No symptoms reported    Endocrine Endocrine Symptoms Reported: No symptoms reported Is  patient diabetic?: No    Gastrointestinal Gastrointestinal Symptoms Reported: No symptoms reported      Genitourinary Genitourinary Symptoms Reported: No symptoms reported    Integumentary Integumentary Symptoms Reported: No symptoms reported    Musculoskeletal Musculoskelatal Symptoms Reviewed: Limited mobility, Unsteady gait Additional Musculoskeletal Details: s/p closed right ankle repair, now working with outpatient PT Musculoskeletal Management Strategies: Adequate rest, Routine screening Musculoskeletal Self-Management Outcome: 4 (good)      Psychosocial Psychosocial Symptoms Reported: No symptoms reported   Major Change/Loss/Stressor/Fears (CP): Denies Quality of Family Relationships: supportive, involved, helpful Do you feel physically threatened by others?: No    05/22/2024    PHQ2-9 Depression Screening   [name] interest or pleasure in doing things    Feeling down, depressed, or hopeless    PHQ-2 - Total Score    Trouble falling or staying asleep, or sleeping too much    Feeling tired or having [name] energy    Poor appetite or overeating     Feeling bad about yourself - or that you are a failure or have let yourself or your family down    Trouble concentrating on things, such as reading the newspaper or watching television    Moving or speaking so slowly that other people could have noticed.  Or the opposite - being so fidgety or restless that you have been moving around a lot more than usual    Thoughts that you would be better off dead, or hurting yourself in some way    PHQ2-9 Total Score    If you checked off any problems, how difficult have these problems made it for you to do your work, take care of things at home, or get  along with other people    Depression Interventions/Treatment      There were no vitals filed for this visit.  Medications Reviewed Today     Reviewed by Morgan Clayborne CROME, RN (Registered Nurse) on 05/22/24 at 1351  Med List Status: <None>    Medication Order Taking? Sig Documenting Provider Last Dose Status Informant  acetaminophen  (TYLENOL ) 325 MG tablet [id number]  Take 650 mg by mouth every 6 (six) hours as needed for mild pain or headache. [provider]  Active Self  amLODipine  (NORVASC ) 10 MG tablet [id number]  TAKE 1 TABLET(10 MG) BY MOUTH DAILY Petrina Pries, NP  Active Self  aspirin  (BAYER ASPIRIN ) 325 MG tablet [id number]  Take 1 tablet by mouth for 30 DAYS for blood clot prevention. Start post op day one [place], Jesse J, PA-C  Active   aspirin  EC 81 MG tablet [id number]  Take 81 mg by mouth daily. Swallow whole.  Patient not taking: Reported on 05/12/2024   [provider]  Active   atorvastatin  (LIPITOR) 80 MG tablet [id number]  Take 1 tablet (80 mg total) by mouth daily. Raford Riggs, MD  Active   carvedilol  (COREG ) 12.5 MG tablet [id number]  Take 1 tablet (12.5 mg total) by mouth 2 (two) times daily with a meal. Raford Riggs, MD  Active Self  cetirizine (ZYRTEC) 10 MG tablet [id number]  Take 10 mg by mouth daily as needed for allergies. [provider]  Active Self  cyclobenzaprine  (FEXMID ) 7.5 MG tablet [id number]  Take 7.5 mg by mouth 3 (three) times daily as needed. [provider]  Active   Diclofenac Sodium 3 % GEL [id number]  SMARTSIG:1-2 Gram(s) Topical 3-4 Times Daily [provider]  Active   furosemide  (LASIX ) 40 MG tablet [id number]  TAKE 1 TABLET(40 MG) BY MOUTH DAILY Georgina Speaks, FNP  Active   hydrALAZINE  (APRESOLINE ) 50 MG tablet [id number]  Take 1 tablet (50 mg total) by mouth 3 (three) times daily. Georgina Speaks, FNP  Active Self  Iron -FA-B Cmp-C-Biot-Probiotic (FUSION PLUS) CAPS [id number]  Take 1 capsule by mouth daily. Petrina Pries, NP  Active   pantoprazole  (PROTONIX ) 40 MG tablet [id number]  Take 40 mg by mouth daily. [provider]  Active Self  polyethylene glycol (MIRALAX  / GLYCOLAX ) 17 g packet [id number]  Take 17 g by mouth daily as needed for  mild constipation or moderate constipation. Petrina Pries, NP  Active   spironolactone  (ALDACTONE ) 25 MG tablet [id number]  TAKE 1 TABLET BY MOUTH DAILY Georgina Speaks, FNP  Active             Recommendation:   Specialty provider follow-up with Dr. Dennise, Nephrologist as directed for management of CKD Continue Current Plan of Care  Follow Up Plan:   Telephone follow up appointment date/time:  Thursday, October 30 at 2:00 PM  Clayborne Morgan RN BSN CCM [place]  [facility], [facility] Health Nurse Care Coordinator  Direct Dial: [phone] Website: [email or web address]

## 2024-05-22 NOTE — Patient Instructions (Signed)
 Visit Information  Thank you for taking time to visit with me today. Please don't hesitate to contact me if I can be of assistance to you before our next scheduled appointment.  Your next care management appointment is by telephone on Thursday, October 30 at 2:00 PM  Please call the care guide team at [phone] if you need to cancel, schedule, or reschedule an appointment.   Please call [phone]-TALK (toll free, 24 hour hotline) if you are experiencing a Mental Health or Behavioral Health Crisis or need someone to talk to.  Clayborne Ly RN BSN CCM [place]  [facility], [facility] Health Nurse Care Coordinator  Direct Dial: [phone] Website: [email or web address]

## 2024-05-25 ENCOUNTER — Other Ambulatory Visit: Payer: Self-pay

## 2024-05-25 ENCOUNTER — Emergency Department (HOSPITAL_COMMUNITY)

## 2024-05-25 ENCOUNTER — Inpatient Hospital Stay (HOSPITAL_COMMUNITY)
Admission: EM | Admit: 2024-05-25 | Discharge: 2024-05-28 | DRG: 389 | Disposition: A | Discharge status: Home Health | Attending: Internal Medicine | Admitting: Internal Medicine

## 2024-05-25 DIAGNOSIS — F17210 Nicotine dependence, cigarettes, uncomplicated: Secondary | ICD-10-CM | POA: Diagnosis not present

## 2024-05-25 DIAGNOSIS — R1013 Epigastric pain: Secondary | ICD-10-CM | POA: Diagnosis not present

## 2024-05-25 DIAGNOSIS — R1011 Right upper quadrant pain: Secondary | ICD-10-CM | POA: Diagnosis not present

## 2024-05-25 DIAGNOSIS — I739 Peripheral vascular disease, unspecified: Secondary | ICD-10-CM | POA: Diagnosis not present

## 2024-05-25 DIAGNOSIS — N179 Acute kidney failure, unspecified: Secondary | ICD-10-CM | POA: Diagnosis not present

## 2024-05-25 DIAGNOSIS — K219 Gastro-esophageal reflux disease without esophagitis: Secondary | ICD-10-CM | POA: Diagnosis present

## 2024-05-25 DIAGNOSIS — Z888 Allergy status to other drugs, medicaments and biological substances status: Secondary | ICD-10-CM

## 2024-05-25 DIAGNOSIS — E279 Disorder of adrenal gland, unspecified: Secondary | ICD-10-CM | POA: Diagnosis present

## 2024-05-25 DIAGNOSIS — I5032 Chronic diastolic (congestive) heart failure: Secondary | ICD-10-CM | POA: Diagnosis present

## 2024-05-25 DIAGNOSIS — X58XXXD Exposure to other specified factors, subsequent encounter: Secondary | ICD-10-CM | POA: Diagnosis present

## 2024-05-25 DIAGNOSIS — J42 Unspecified chronic bronchitis: Secondary | ICD-10-CM | POA: Diagnosis not present

## 2024-05-25 DIAGNOSIS — R1084 Generalized abdominal pain: Secondary | ICD-10-CM | POA: Diagnosis not present

## 2024-05-25 DIAGNOSIS — I700 Atherosclerosis of aorta: Secondary | ICD-10-CM | POA: Diagnosis not present

## 2024-05-25 DIAGNOSIS — E872 Acidosis, unspecified: Secondary | ICD-10-CM | POA: Diagnosis present

## 2024-05-25 DIAGNOSIS — N1832 Chronic kidney disease, stage 3b: Secondary | ICD-10-CM | POA: Diagnosis not present

## 2024-05-25 DIAGNOSIS — I130 Hypertensive heart and chronic kidney disease with heart failure and stage 1 through stage 4 chronic kidney disease, or unspecified chronic kidney disease: Secondary | ICD-10-CM | POA: Diagnosis present

## 2024-05-25 DIAGNOSIS — D631 Anemia in chronic kidney disease: Secondary | ICD-10-CM | POA: Diagnosis not present

## 2024-05-25 DIAGNOSIS — N183 Chronic kidney disease, stage 3 unspecified: Secondary | ICD-10-CM | POA: Diagnosis present

## 2024-05-25 DIAGNOSIS — Z79899 Other long term (current) drug therapy: Secondary | ICD-10-CM | POA: Diagnosis not present

## 2024-05-25 DIAGNOSIS — Z7982 Long term (current) use of aspirin: Secondary | ICD-10-CM | POA: Diagnosis not present

## 2024-05-25 DIAGNOSIS — R748 Abnormal levels of other serum enzymes: Secondary | ICD-10-CM | POA: Diagnosis present

## 2024-05-25 DIAGNOSIS — N184 Chronic kidney disease, stage 4 (severe): Secondary | ICD-10-CM | POA: Diagnosis present

## 2024-05-25 DIAGNOSIS — Z8419 Family history of other disorders of kidney and ureter: Secondary | ICD-10-CM

## 2024-05-25 DIAGNOSIS — K769 Liver disease, unspecified: Secondary | ICD-10-CM | POA: Diagnosis not present

## 2024-05-25 DIAGNOSIS — I5189 Other ill-defined heart diseases: Secondary | ICD-10-CM

## 2024-05-25 DIAGNOSIS — E785 Hyperlipidemia, unspecified: Secondary | ICD-10-CM | POA: Diagnosis not present

## 2024-05-25 DIAGNOSIS — S82851D Displaced trimalleolar fracture of right lower leg, subsequent encounter for closed fracture with routine healing: Secondary | ICD-10-CM

## 2024-05-25 DIAGNOSIS — Z90711 Acquired absence of uterus with remaining cervical stump: Secondary | ICD-10-CM

## 2024-05-25 DIAGNOSIS — R112 Nausea with vomiting, unspecified: Secondary | ICD-10-CM | POA: Diagnosis not present

## 2024-05-25 DIAGNOSIS — Z981 Arthrodesis status: Secondary | ICD-10-CM

## 2024-05-25 DIAGNOSIS — R109 Unspecified abdominal pain: Secondary | ICD-10-CM | POA: Diagnosis not present

## 2024-05-25 DIAGNOSIS — Z8249 Family history of ischemic heart disease and other diseases of the circulatory system: Secondary | ICD-10-CM | POA: Diagnosis not present

## 2024-05-25 DIAGNOSIS — Z885 Allergy status to narcotic agent status: Secondary | ICD-10-CM

## 2024-05-25 DIAGNOSIS — I160 Hypertensive urgency: Secondary | ICD-10-CM | POA: Diagnosis present

## 2024-05-25 DIAGNOSIS — Z833 Family history of diabetes mellitus: Secondary | ICD-10-CM

## 2024-05-25 DIAGNOSIS — I10 Essential (primary) hypertension: Secondary | ICD-10-CM | POA: Diagnosis present

## 2024-05-25 DIAGNOSIS — E669 Obesity, unspecified: Secondary | ICD-10-CM | POA: Diagnosis present

## 2024-05-25 DIAGNOSIS — Z6835 Body mass index (BMI) 35.0-35.9, adult: Secondary | ICD-10-CM

## 2024-05-25 DIAGNOSIS — K56609 Unspecified intestinal obstruction, unspecified as to partial versus complete obstruction: Principal | ICD-10-CM | POA: Diagnosis present

## 2024-05-25 LAB — CBC
HCT: 39.8 % (ref 36.0–46.0)
Hemoglobin: 11.5 g/dL — ABNORMAL LOW (ref 12.0–15.0)
MCH: 26.9 pg (ref 26.0–34.0)
MCHC: 28.9 g/dL — ABNORMAL LOW (ref 30.0–36.0)
MCV: 93 fL (ref 80.0–100.0)
Platelets: 279 K/uL (ref 150–400)
RBC: 4.28 MIL/uL (ref 3.87–5.11)
RDW: 13.5 % (ref 11.5–15.5)
WBC: 10.7 K/uL — ABNORMAL HIGH (ref 4.0–10.5)
nRBC: 0 % (ref 0.0–0.2)

## 2024-05-25 LAB — COMPREHENSIVE METABOLIC PANEL WITH GFR
ALT: 40 U/L (ref 0–44)
AST: 38 U/L (ref 15–41)
Albumin: 4.4 g/dL (ref 3.5–5.0)
Alkaline Phosphatase: 121 U/L (ref 38–126)
Anion gap: 15 (ref 5–15)
BUN: 45 mg/dL — ABNORMAL HIGH (ref 8–23)
CO2: 19 mmol/L — ABNORMAL LOW (ref 22–32)
Calcium: 9.5 mg/dL (ref 8.9–10.3)
Chloride: 104 mmol/L (ref 98–111)
Creatinine, Ser: 2.58 mg/dL — ABNORMAL HIGH (ref 0.44–1.00)
GFR, Estimated: 20 mL/min — ABNORMAL LOW (ref >60)
Glucose, Bld: 122 mg/dL — ABNORMAL HIGH (ref 70–99)
Potassium: 4.8 mmol/L (ref 3.5–5.1)
Sodium: 137 mmol/L (ref 135–145)
Total Bilirubin: 0.6 mg/dL (ref 0.0–1.2)
Total Protein: 8.2 g/dL — ABNORMAL HIGH (ref 6.5–8.1)

## 2024-05-25 LAB — LIPASE, BLOOD: Lipase: 182 U/L — ABNORMAL HIGH (ref 11–51)

## 2024-05-25 MED ORDER — ONDANSETRON HCL 4 MG/2ML IJ SOLN
4.0000 mg | Freq: Once | INTRAMUSCULAR | Status: AC
  Administered 2024-05-25: 4 mg via INTRAVENOUS
  Filled 2024-05-25: qty 2

## 2024-05-25 MED ORDER — LACTATED RINGERS IV SOLN: INTRAVENOUS | Status: DC

## 2024-05-25 MED ORDER — LACTATED RINGERS IV BOLUS
1000.0000 mL | Freq: Once | INTRAVENOUS | Status: AC
  Administered 2024-05-25: 1000 mL via INTRAVENOUS

## 2024-05-25 MED ORDER — MORPHINE SULFATE (PF) 4 MG/ML IV SOLN
6.0000 mg | Freq: Once | INTRAVENOUS | Status: AC
  Administered 2024-05-25: 6 mg via INTRAVENOUS
  Filled 2024-05-25: qty 2

## 2024-05-25 NOTE — ED Provider Notes (Signed)
 [place] EMERGENCY DEPARTMENT AT [facility] Provider Note   CSN: [id number] Arrival date & time: 05/25/24  2129     Patient presents with: Abdominal Pain   [name] is a 63 y.o. female.   63 year old female presents with epigastric and right upper quadrant abdominal pain with nausea vomiting which began earlier this afternoon.  States that her emesis has been nonbilious nonbloody.  No fever or chills.  Pain is nonradiating and characterizes a burning sensation.  No cardiac symptomatology.  Denies any diarrhea.  No prior history of same.  Denies any prior history of surgeries.  Called EMS and transported here       Prior to Admission medications   Medication Sig Start Date End Date Taking? Authorizing Provider  acetaminophen  (TYLENOL ) 325 MG tablet Take 650 mg by mouth every 6 (six) hours as needed for mild pain or headache.    [provider]  amLODipine  (NORVASC ) 10 MG tablet TAKE 1 TABLET(10 MG) BY MOUTH DAILY 12/04/23   Petrina Pries, NP  aspirin  (BAYER ASPIRIN ) 325 MG tablet Take 1 tablet by mouth for 30 DAYS for blood clot prevention. Start post op day one 02/19/24   [place], Jesse J, PA-C  aspirin  EC 81 MG tablet Take 81 mg by mouth daily. Swallow whole. Patient not taking: Reported on 05/12/2024    [provider]  atorvastatin  (LIPITOR) 80 MG tablet Take 1 tablet (80 mg total) by mouth daily. 03/28/24   Raford Riggs, MD  carvedilol  (COREG ) 12.5 MG tablet Take 1 tablet (12.5 mg total) by mouth 2 (two) times daily with a meal. 06/06/23   Raford Riggs, MD  cetirizine (ZYRTEC) 10 MG tablet Take 10 mg by mouth daily as needed for allergies.    [provider]  cyclobenzaprine  (FEXMID ) 7.5 MG tablet Take 7.5 mg by mouth 3 (three) times daily as needed. 04/28/24   [provider]  Diclofenac Sodium 3 % GEL SMARTSIG:1-2 Gram(s) Topical 3-4 Times Daily 04/28/24   [provider]  furosemide  (LASIX ) 40 MG tablet TAKE 1  TABLET(40 MG) BY MOUTH DAILY 02/11/24   Georgina Speaks, FNP  hydrALAZINE  (APRESOLINE ) 50 MG tablet Take 1 tablet (50 mg total) by mouth 3 (three) times daily. 10/09/23   Georgina Speaks, FNP  Iron -FA-B Cmp-C-Biot-Probiotic (FUSION PLUS) CAPS Take 1 capsule by mouth daily. 03/13/24   Petrina Pries, NP  pantoprazole  (PROTONIX ) 40 MG tablet Take 40 mg by mouth daily. 09/27/23   [provider]  polyethylene glycol (MIRALAX  / GLYCOLAX ) 17 g packet Take 17 g by mouth daily as needed for mild constipation or moderate constipation. 03/13/24   Petrina Pries, NP  spironolactone  (ALDACTONE ) 25 MG tablet TAKE 1 TABLET BY MOUTH DAILY 03/26/24   Georgina Speaks, FNP    Allergies: Oxycodone  and Lisinopril     Review of Systems  All other systems reviewed and are negative.   Updated Vital Signs BP (S) (!) 211/101 Comment: Pt states she took her hypertension medication today but was unable to keep it down  Pulse 79   Temp 98.3 F (36.8 C)   Resp 20   SpO2 97%   Physical Exam Vitals and nursing note reviewed.  Constitutional:      General: She is not in acute distress.    Appearance: Normal appearance. She is well-developed. She is not toxic-appearing.  HENT:     Head: Normocephalic and atraumatic.  Eyes:     General: Lids are normal.     Conjunctiva/sclera: Conjunctivae normal.  Pupils: Pupils are equal, round, and reactive to light.  Neck:     Thyroid: No thyroid mass.     Trachea: No tracheal deviation.  Cardiovascular:     Rate and Rhythm: Normal rate and regular rhythm.     Heart sounds: Normal heart sounds. No murmur heard.    No gallop.  Pulmonary:     Effort: Pulmonary effort is normal. No respiratory distress.     Breath sounds: Normal breath sounds. No stridor. No decreased breath sounds, wheezing, rhonchi or rales.  Abdominal:     General: There is no distension.     Palpations: Abdomen is soft.     Tenderness: There is abdominal tenderness in the right upper quadrant and  epigastric area. There is guarding. There is no rebound.   Musculoskeletal:        General: No tenderness. Normal range of motion.     Cervical back: Normal range of motion and neck supple.  Skin:    General: Skin is warm and dry.     Findings: No abrasion or rash.  Neurological:     Mental Status: She is alert and oriented to person, place, and time. Mental status is at baseline.     GCS: GCS eye subscore is 4. GCS verbal subscore is 5. GCS motor subscore is 6.     Cranial Nerves: No cranial nerve deficit.     Sensory: No sensory deficit.     Motor: Motor function is intact.  Psychiatric:        Attention and Perception: Attention normal.        Speech: Speech normal.        Behavior: Behavior normal.     (all labs ordered are listed, but only abnormal results are displayed) Labs Reviewed  LIPASE, BLOOD  COMPREHENSIVE METABOLIC PANEL WITH GFR  CBC  URINALYSIS, ROUTINE W REFLEX MICROSCOPIC    EKG: None  Radiology: No results found.   Procedures   Medications Ordered in the ED  lactated ringers  bolus 1,000 mL (has no administration in time range)  lactated ringers  infusion (has no administration in time range)  morphine  (PF) 4 MG/ML injection 6 mg (has no administration in time range)  ondansetron  (ZOFRAN ) injection 4 mg (has no administration in time range)                                    Medical Decision Making Amount and/or Complexity of Data Reviewed Labs: ordered. Radiology: ordered.  Risk Prescription drug management.   Patient treated with IV fluids as well as analgesics and antiemetics.  Concern for possible gallbladder etiology of her symptoms.  Will order abdominal ultrasound.  Will sign out to Dr. Carita     Final diagnoses:  None    ED Discharge Orders     None          Dasie Faden, MD 05/25/24 2156

## 2024-05-25 NOTE — ED Triage Notes (Signed)
 Pt bib [facility] c/o abdominal pain and N/V that started around 1pm today. Reports she has not been able to keep anything down.

## 2024-05-25 NOTE — ED Notes (Signed)
 Patient transported to CT

## 2024-05-25 NOTE — ED Notes (Signed)
 Ultrasound at bedside

## 2024-05-26 ENCOUNTER — Inpatient Hospital Stay (HOSPITAL_COMMUNITY)

## 2024-05-26 DIAGNOSIS — I700 Atherosclerosis of aorta: Secondary | ICD-10-CM | POA: Diagnosis present

## 2024-05-26 DIAGNOSIS — Z8249 Family history of ischemic heart disease and other diseases of the circulatory system: Secondary | ICD-10-CM | POA: Diagnosis not present

## 2024-05-26 DIAGNOSIS — K56600 Partial intestinal obstruction, unspecified as to cause: Secondary | ICD-10-CM | POA: Diagnosis not present

## 2024-05-26 DIAGNOSIS — K219 Gastro-esophageal reflux disease without esophagitis: Secondary | ICD-10-CM | POA: Diagnosis present

## 2024-05-26 DIAGNOSIS — I5032 Chronic diastolic (congestive) heart failure: Secondary | ICD-10-CM | POA: Diagnosis present

## 2024-05-26 DIAGNOSIS — E279 Disorder of adrenal gland, unspecified: Secondary | ICD-10-CM | POA: Diagnosis present

## 2024-05-26 DIAGNOSIS — D631 Anemia in chronic kidney disease: Secondary | ICD-10-CM | POA: Diagnosis present

## 2024-05-26 DIAGNOSIS — Z4682 Encounter for fitting and adjustment of non-vascular catheter: Secondary | ICD-10-CM | POA: Diagnosis not present

## 2024-05-26 DIAGNOSIS — E785 Hyperlipidemia, unspecified: Secondary | ICD-10-CM | POA: Diagnosis present

## 2024-05-26 DIAGNOSIS — Z90711 Acquired absence of uterus with remaining cervical stump: Secondary | ICD-10-CM | POA: Diagnosis not present

## 2024-05-26 DIAGNOSIS — K5669 Other partial intestinal obstruction: Secondary | ICD-10-CM | POA: Diagnosis not present

## 2024-05-26 DIAGNOSIS — Z885 Allergy status to narcotic agent status: Secondary | ICD-10-CM | POA: Diagnosis not present

## 2024-05-26 DIAGNOSIS — E872 Acidosis, unspecified: Secondary | ICD-10-CM | POA: Diagnosis present

## 2024-05-26 DIAGNOSIS — I739 Peripheral vascular disease, unspecified: Secondary | ICD-10-CM | POA: Diagnosis present

## 2024-05-26 DIAGNOSIS — K56609 Unspecified intestinal obstruction, unspecified as to partial versus complete obstruction: Principal | ICD-10-CM | POA: Diagnosis present

## 2024-05-26 DIAGNOSIS — I130 Hypertensive heart and chronic kidney disease with heart failure and stage 1 through stage 4 chronic kidney disease, or unspecified chronic kidney disease: Secondary | ICD-10-CM | POA: Diagnosis present

## 2024-05-26 DIAGNOSIS — E669 Obesity, unspecified: Secondary | ICD-10-CM | POA: Diagnosis present

## 2024-05-26 DIAGNOSIS — X58XXXD Exposure to other specified factors, subsequent encounter: Secondary | ICD-10-CM | POA: Diagnosis present

## 2024-05-26 DIAGNOSIS — J42 Unspecified chronic bronchitis: Secondary | ICD-10-CM | POA: Diagnosis present

## 2024-05-26 DIAGNOSIS — N179 Acute kidney failure, unspecified: Secondary | ICD-10-CM | POA: Diagnosis present

## 2024-05-26 DIAGNOSIS — R748 Abnormal levels of other serum enzymes: Secondary | ICD-10-CM | POA: Diagnosis present

## 2024-05-26 DIAGNOSIS — Z981 Arthrodesis status: Secondary | ICD-10-CM | POA: Diagnosis not present

## 2024-05-26 DIAGNOSIS — Z7982 Long term (current) use of aspirin: Secondary | ICD-10-CM | POA: Diagnosis not present

## 2024-05-26 DIAGNOSIS — N1832 Chronic kidney disease, stage 3b: Secondary | ICD-10-CM | POA: Diagnosis present

## 2024-05-26 DIAGNOSIS — I160 Hypertensive urgency: Secondary | ICD-10-CM | POA: Diagnosis present

## 2024-05-26 DIAGNOSIS — Z79899 Other long term (current) drug therapy: Secondary | ICD-10-CM | POA: Diagnosis not present

## 2024-05-26 DIAGNOSIS — Z888 Allergy status to other drugs, medicaments and biological substances status: Secondary | ICD-10-CM | POA: Diagnosis not present

## 2024-05-26 DIAGNOSIS — F17210 Nicotine dependence, cigarettes, uncomplicated: Secondary | ICD-10-CM | POA: Diagnosis present

## 2024-05-26 LAB — URINALYSIS, ROUTINE W REFLEX MICROSCOPIC
Bilirubin Urine: NEGATIVE
Glucose, UA: NEGATIVE mg/dL
Hgb urine dipstick: NEGATIVE
Ketones, ur: NEGATIVE mg/dL
Leukocytes,Ua: NEGATIVE
Nitrite: NEGATIVE
Protein, ur: >=300 mg/dL — AB
Specific Gravity, Urine: 1.013 (ref 1.005–1.030)
pH: 5 (ref 5.0–8.0)

## 2024-05-26 LAB — PHOSPHORUS: Phosphorus: 4.3 mg/dL (ref 2.5–4.6)

## 2024-05-26 LAB — CBC
HCT: 38.3 % (ref 36.0–46.0)
Hemoglobin: 11.2 g/dL — ABNORMAL LOW (ref 12.0–15.0)
MCH: 27.7 pg (ref 26.0–34.0)
MCHC: 29.2 g/dL — ABNORMAL LOW (ref 30.0–36.0)
MCV: 94.8 fL (ref 80.0–100.0)
Platelets: 294 K/uL (ref 150–400)
RBC: 4.04 MIL/uL (ref 3.87–5.11)
RDW: 13.4 % (ref 11.5–15.5)
WBC: 10.2 K/uL (ref 4.0–10.5)
nRBC: 0 % (ref 0.0–0.2)

## 2024-05-26 LAB — BASIC METABOLIC PANEL WITH GFR
Anion gap: 12 (ref 5–15)
BUN: 42 mg/dL — ABNORMAL HIGH (ref 8–23)
CO2: 21 mmol/L — ABNORMAL LOW (ref 22–32)
Calcium: 9.1 mg/dL (ref 8.9–10.3)
Chloride: 107 mmol/L (ref 98–111)
Creatinine, Ser: 2.27 mg/dL — ABNORMAL HIGH (ref 0.44–1.00)
GFR, Estimated: 24 mL/min — ABNORMAL LOW (ref >60)
Glucose, Bld: 103 mg/dL — ABNORMAL HIGH (ref 70–99)
Potassium: 4.6 mmol/L (ref 3.5–5.1)
Sodium: 140 mmol/L (ref 135–145)

## 2024-05-26 LAB — MAGNESIUM: Magnesium: 2.3 mg/dL (ref 1.7–2.4)

## 2024-05-26 LAB — LIPASE, BLOOD: Lipase: 167 U/L — ABNORMAL HIGH (ref 11–51)

## 2024-05-26 LAB — HIV ANTIBODY (ROUTINE TESTING W REFLEX): HIV Screen 4th Generation wRfx: NONREACTIVE

## 2024-05-26 MED ORDER — HYDRALAZINE HCL 20 MG/ML IJ SOLN: 10.0000 mg | Freq: Four times a day (QID) | INTRAMUSCULAR | Status: DC | PRN

## 2024-05-26 MED ORDER — HYDRALAZINE HCL 20 MG/ML IJ SOLN
5.0000 mg | INTRAMUSCULAR | Status: AC
  Administered 2024-05-26: 5 mg via INTRAVENOUS
  Filled 2024-05-26: qty 1

## 2024-05-26 MED ORDER — METOPROLOL TARTRATE 5 MG/5ML IV SOLN
5.0000 mg | Freq: Three times a day (TID) | INTRAVENOUS | Status: DC
  Administered 2024-05-26 – 2024-05-27 (×4): 5 mg via INTRAVENOUS
  Filled 2024-05-26 (×4): qty 5

## 2024-05-26 MED ORDER — HYDRALAZINE HCL 20 MG/ML IJ SOLN
5.0000 mg | INTRAMUSCULAR | Status: AC
  Administered 2024-05-26: 5 mg via INTRAVENOUS

## 2024-05-26 MED ORDER — HYDRALAZINE HCL 20 MG/ML IJ SOLN
5.0000 mg | Freq: Four times a day (QID) | INTRAMUSCULAR | Status: DC | PRN
  Administered 2024-05-26 – 2024-05-28 (×2): 5 mg via INTRAVENOUS
  Filled 2024-05-26 (×2): qty 1

## 2024-05-26 MED ORDER — HYDRALAZINE HCL 20 MG/ML IJ SOLN
20.0000 mg | Freq: Three times a day (TID) | INTRAMUSCULAR | Status: DC
  Administered 2024-05-26 – 2024-05-27 (×4): 20 mg via INTRAVENOUS
  Filled 2024-05-26 (×4): qty 1

## 2024-05-26 MED ORDER — LACTATED RINGERS IV BOLUS
1000.0000 mL | Freq: Once | INTRAVENOUS | Status: AC
  Administered 2024-05-26: 1000 mL via INTRAVENOUS

## 2024-05-26 MED ORDER — PROCHLORPERAZINE EDISYLATE 10 MG/2ML IJ SOLN
5.0000 mg | Freq: Four times a day (QID) | INTRAMUSCULAR | Status: DC | PRN
  Administered 2024-05-26: 5 mg via INTRAVENOUS
  Filled 2024-05-26: qty 2

## 2024-05-26 MED ORDER — LACTATED RINGERS IV SOLN: INTRAVENOUS | Status: DC

## 2024-05-26 MED ORDER — HYDRALAZINE HCL 20 MG/ML IJ SOLN
5.0000 mg | Freq: Four times a day (QID) | INTRAMUSCULAR | Status: DC | PRN
  Administered 2024-05-26: 5 mg via INTRAVENOUS
  Filled 2024-05-26: qty 1

## 2024-05-26 MED ORDER — MORPHINE SULFATE (PF) 2 MG/ML IV SOLN
2.0000 mg | Freq: Once | INTRAVENOUS | Status: AC | PRN
  Administered 2024-05-26: 2 mg via INTRAVENOUS
  Filled 2024-05-26: qty 1

## 2024-05-26 MED ORDER — HYDRALAZINE HCL 20 MG/ML IJ SOLN
5.0000 mg | Freq: Three times a day (TID) | INTRAMUSCULAR | Status: DC | PRN
  Filled 2024-05-26: qty 1

## 2024-05-26 MED ORDER — HEPARIN SODIUM (PORCINE) 5000 UNIT/ML IJ SOLN
5000.0000 [IU] | Freq: Three times a day (TID) | INTRAMUSCULAR | Status: DC
  Administered 2024-05-26 – 2024-05-28 (×7): 5000 [IU] via SUBCUTANEOUS
  Filled 2024-05-26 (×7): qty 1

## 2024-05-26 MED ORDER — BISACODYL 10 MG RE SUPP: 10.0000 mg | Freq: Once | RECTAL | Status: DC

## 2024-05-26 MED ORDER — DIATRIZOATE MEGLUMINE & SODIUM 66-10 % PO SOLN
90.0000 mL | Freq: Once | ORAL | Status: AC
  Administered 2024-05-26: 90 mL via NASOGASTRIC
  Filled 2024-05-26: qty 90

## 2024-05-26 NOTE — ED Notes (Signed)
 Per Dr. Shona NG is in correct position and can be hooked up to suction

## 2024-05-26 NOTE — Consult Note (Signed)
 Consult Note  [name] 07/18/1961  [id number].    Requesting MD: Terry Hurst, DO Chief Complaint/Reason for Consult: SBO HPI:  Patient is a 63 year old female who presented to the ED yesterday with acute onset abdominal pain, nausea and vomiting. Pain started around 1 PM while patient was at church and was generalized. Pain described as a knot-like sensation. She takes miralax  every other day at baseline and last BM was yesterday after this. Prior abdominal hysterectomy in the early 90s, no hx of prior SBO. PMH otherwise significant for HFpEF, CKD stage IIIb, Chronic bronchitis, HTN, HLD, PVD and GERD. Intolerances to oxycodone  and lisinopril . Not on any blood thinners. This AM patient reports she is now passing some flatus and pain is significantly improved since NGT placement.   ROS: Negative other than HPI  Family History  Problem Relation Age of Onset   Hypertension Mother    Kidney failure Mother    Heart attack Father    Hypertension Sister    Diabetes Sister    Hypertension Brother    Diabetes Sister     Past Medical History:  Diagnosis Date   (HFpEF) heart failure with preserved ejection fraction (HCC) 05/15/2023   Adrenal adenoma 01/11/2021   Anemia    CHF (congestive heart failure) (HCC)    Chronic bronchitis (HCC)    probably once/yr (05/12/2013)   Chronic kidney disease    Diastolic heart failure    Dyslipidemia    pt denies this hx on 05/12/2013   Exertional shortness of breath    GERD (gastroesophageal reflux disease)    Heart murmur    Hypertension    Hypokalemia    Left ventricular hypertrophy 01/11/2021   Primary hypertension 05/12/2013   Venous stasis ulcers (HCC)     Past Surgical History:  Procedure Laterality Date   ANKLE FUSION Right 02/19/2024   Procedure: RIGHT ANKLE ARTHRODESIS;  Surgeon: Elsa Lonni SAUNDERS, MD;  Location: [facility] OR;  Service: Orthopedics;  Laterality: Right;   BIOPSY  02/23/2023   Procedure: BIOPSY;  Surgeon:  Wilhelmenia Aloha Raddle., MD;  Location: [facility] ENDOSCOPY;  Service: Gastroenterology;;   COLONOSCOPY N/A 02/23/2023   Procedure: COLONOSCOPY;  Surgeon: Wilhelmenia Aloha Raddle., MD;  Location: [facility] ENDOSCOPY;  Service: Gastroenterology;  Laterality: N/A;   ESOPHAGOGASTRODUODENOSCOPY N/A 02/23/2023   Procedure: ESOPHAGOGASTRODUODENOSCOPY (EGD);  Surgeon: Wilhelmenia Aloha Raddle., MD;  Location[facility] ENDOSCOPY;  Service: Gastroenterology;  Laterality: N/A;   HARDWARE REMOVAL Right 05/08/2023   Procedure: HARDWARE REMOVAL MEDIAL AND LATERAL;  Surgeon: Elsa Lonni SAUNDERS, MD;  Location: [facility] ORS;  Service: Orthopedics;  Laterality: Right;   HARDWARE REMOVAL Right 02/19/2024   Procedure: HARDWARE REMOVAL RIGHT ANKLE;  Surgeon: Elsa Lonni SAUNDERS, MD;  Location: [facility] OR;  Service: Orthopedics;  Laterality: Right;   HOT HEMOSTASIS N/A 02/23/2023   Procedure: HOT HEMOSTASIS (ARGON PLASMA COAGULATION/BICAP);  Surgeon: Wilhelmenia Aloha Raddle., MD;  Location: [facility] ENDOSCOPY;  Service: Gastroenterology;  Laterality: N/A;   IR US  GUIDE VASC ACCESS RIGHT  02/27/2017   IR VENOGRAM ADRENAL BI  02/27/2017   IR VENOGRAM HEPATIC WO HEMODYNAMIC EVALUATION  02/27/2017   IR VENOGRAM RENAL BI  02/27/2017   IR VENOUS SAMPLING  02/27/2017   IR VENOUS SAMPLING  02/27/2017   ORIF ANKLE FRACTURE Right 04/18/2023   Procedure: OPEN REDUCTION INTERNAL FIXATION (ORIF) ANKLE FRACTURE;  Surgeon: Edna Toribio LABOR, MD;  Location: [facility] ORS;  Service: Orthopedics;  Laterality: Right;   ORIF ANKLE FRACTURE Right 05/08/2023  Procedure: REVISION OPEN REDUCTION INTERNAL FIXATION (ORIF) OF RIGHT TRIMALLEOLAR ANKLE FRACTURE, OPEN TREATMENT OF SYNDESMOSIS;  Surgeon: Elsa Lonni SAUNDERS, MD;  Location: [facility] ORS;  Service: Orthopedics;  Laterality: Right;   POLYPECTOMY  02/23/2023   Procedure: POLYPECTOMY;  Surgeon: Wilhelmenia Aloha Raddle., MD;  Location: [facility] ENDOSCOPY;  Service: Gastroenterology;;   SYNDESMOSIS REPAIR Right 05/08/2023   Procedure: SYNDESMOSIS REPAIR;   Surgeon: Elsa Lonni SAUNDERS, MD;  Location: [facility] ORS;  Service: Orthopedics;  Laterality: Right;   VAGINAL HYSTERECTOMY  1990's    Social History:  reports that she has been smoking cigarettes. She has a 5 pack-year smoking history. She has never used smokeless tobacco. She reports that she does not currently use alcohol . She reports current drug use. Drug: Oxycodone .  Allergies:  Allergies  Allergen Reactions   Oxycodone  Other (See Comments)    Severe constipation    Lisinopril  Cough    Medications Prior to Admission  Medication Sig Dispense Refill   acetaminophen  (TYLENOL ) 325 MG tablet Take 650 mg by mouth every 6 (six) hours as needed for mild pain or headache.     amLODipine  (NORVASC ) 10 MG tablet TAKE 1 TABLET(10 MG) BY MOUTH DAILY 90 tablet 1   aspirin  EC 81 MG tablet Take 81 mg by mouth daily. Swallow whole.     atorvastatin  (LIPITOR) 80 MG tablet Take 1 tablet (80 mg total) by mouth daily. 90 tablet 3   carvedilol  (COREG ) 12.5 MG tablet Take 1 tablet (12.5 mg total) by mouth 2 (two) times daily with a meal. 180 tablet 3   Diclofenac Sodium 3 % GEL SMARTSIG:1-2 Gram(s) Topical 3-4 Times Daily     furosemide  (LASIX ) 40 MG tablet TAKE 1 TABLET(40 MG) BY MOUTH DAILY 90 tablet 1   hydrALAZINE  (APRESOLINE ) 50 MG tablet Take 1 tablet (50 mg total) by mouth 3 (three) times daily. 270 tablet 1   polyethylene glycol (MIRALAX  / GLYCOLAX ) 17 g packet Take 17 g by mouth daily as needed for mild constipation or moderate constipation. 14 each 5   spironolactone  (ALDACTONE ) 25 MG tablet TAKE 1 TABLET BY MOUTH DAILY 90 tablet 1   aspirin  (BAYER ASPIRIN ) 325 MG tablet Take 1 tablet by mouth for 30 DAYS for blood clot prevention. Start post op day one (Patient not taking: Reported on 05/26/2024) 30 tablet 0   cetirizine (ZYRTEC) 10 MG tablet Take 10 mg by mouth daily as needed for allergies.     cyclobenzaprine  (FEXMID ) 7.5 MG tablet Take 7.5 mg by mouth 3 (three) times daily as needed.      Iron -FA-B Cmp-C-Biot-Probiotic (FUSION PLUS) CAPS Take 1 capsule by mouth daily. (Patient not taking: Reported on 05/26/2024) 30 capsule 5   pantoprazole  (PROTONIX ) 40 MG tablet Take 40 mg by mouth daily. (Patient not taking: Reported on 05/26/2024)      Blood pressure (!) 216/103, pulse 81, temperature 98 F (36.7 C), temperature source Oral, resp. rate 17, SpO2 98%. Physical Exam:  General: pleasant, WD, obese female who is laying in bed in NAD HEENT: head is normocephalic, atraumatic.  Sclera are noninjected.  Ears and nose without any masses or lesions.  Mouth is pink and moist Heart: regular, rate, and rhythm.  Lungs: CTAB, no wheezes, rhonchi, or rales noted.  Respiratory effort nonlabored Abd: soft, NT, mild distention, minimal thin bilious output from NGT MS: all 4 extremities are symmetrical with no cyanosis, clubbing, or edema. Skin: warm and dry with no masses, lesions, or rashes Neuro: Cranial nerves 2-12 grossly intact,  sensation is normal throughout Psych: A&Ox3 with an appropriate affect.   Results for orders placed or performed during the hospital encounter of 05/25/24 (from the past 48 hours)  Lipase, blood     Status: Abnormal   Collection Time: 05/25/24 10:16 PM  Result Value Ref Range   Lipase 182 (H) 11 - 51 U/L    Comment: Performed at [facility], [address] [place], [place] [address]  Comprehensive metabolic panel     Status: Abnormal   Collection Time: 05/25/24 10:16 PM  Result Value Ref Range   Sodium 137 135 - 145 mmol/L   Potassium 4.8 3.5 - 5.1 mmol/L    Comment: HEMOLYSIS AT THIS LEVEL MAY AFFECT RESULT   Chloride 104 98 - 111 mmol/L   CO2 19 (L) 22 - 32 mmol/L   Glucose, Bld 122 (H) 70 - 99 mg/dL    Comment: Glucose reference range applies only to samples taken after fasting for at least 8 hours.   BUN 45 (H) 8 - 23 mg/dL   Creatinine, Ser 7.41 (H) 0.44 - 1.00 mg/dL   Calcium  9.5 8.9 - 10.3 mg/dL   Total Protein 8.2 (H) 6.5 -  8.1 g/dL   Albumin 4.4 3.5 - 5.0 g/dL   AST 38 15 - 41 U/L    Comment: HEMOLYSIS AT THIS LEVEL MAY AFFECT RESULT   ALT 40 0 - 44 U/L   Alkaline Phosphatase 121 38 - 126 U/L   Total Bilirubin 0.6 0.0 - 1.2 mg/dL   GFR, Estimated 20 (L) >60 mL/min    Comment: (NOTE) Calculated using the CKD-EPI Creatinine Equation (2021)    Anion gap 15 5 - 15    Comment: Performed at [facility], [address] [place], [place] [address]  CBC     Status: Abnormal   Collection Time: 05/25/24 10:16 PM  Result Value Ref Range   WBC 10.7 (H) 4.0 - 10.5 K/uL   RBC 4.28 3.87 - 5.11 MIL/uL   Hemoglobin 11.5 (L) 12.0 - 15.0 g/dL   HCT 60.1 63.9 - 53.9 %   MCV 93.0 80.0 - 100.0 fL   MCH 26.9 26.0 - 34.0 pg   MCHC 28.9 (L) 30.0 - 36.0 g/dL   RDW 86.4 88.4 - 84.4 %   Platelets 279 150 - 400 K/uL   nRBC 0.0 0.0 - 0.2 %    Comment: Performed at [facility], [address], [place], [place] [address]  Urinalysis, Routine w reflex microscopic -Urine, Clean Catch     Status: Abnormal   Collection Time: 05/26/24  5:04 AM  Result Value Ref Range   Color, Urine YELLOW YELLOW   APPearance HAZY (A) CLEAR   Specific Gravity, Urine 1.013 1.005 - 1.030   pH 5.0 5.0 - 8.0   Glucose, UA NEGATIVE NEGATIVE mg/dL   Hgb urine dipstick NEGATIVE NEGATIVE   Bilirubin Urine NEGATIVE NEGATIVE   Ketones, ur NEGATIVE NEGATIVE mg/dL   Protein, ur >=699 (A) NEGATIVE mg/dL   Nitrite NEGATIVE NEGATIVE   Leukocytes,Ua NEGATIVE NEGATIVE   RBC / HPF 0-5 0 - 5 RBC/hpf   WBC, UA 0-5 0 - 5 WBC/hpf   Bacteria, UA RARE (A) NONE SEEN   Squamous Epithelial / HPF 0-5 0 - 5 /HPF    Comment: Performed at [facility], [address] [place], [place] [address]  HIV Antibody (routine testing w rflx)     Status: None   Collection  Time: 05/26/24  7:01 AM  Result Value Ref Range   HIV Screen 4th Generation wRfx Non Reactive Non Reactive    Comment: Performed at [facility]  Lab, [address] [place], [place] [address]  Lipase, blood     Status: Abnormal   Collection Time: 05/26/24  7:01 AM  Result Value Ref Range   Lipase 167 (H) 11 - 51 U/L    Comment: Performed at [facility], [address] [place], [place] [address]  CBC     Status: Abnormal   Collection Time: 05/26/24  7:01 AM  Result Value Ref Range   WBC 10.2 4.0 - 10.5 K/uL   RBC 4.04 3.87 - 5.11 MIL/uL   Hemoglobin 11.2 (L) 12.0 - 15.0 g/dL   HCT 61.6 63.9 - 53.9 %   MCV 94.8 80.0 - 100.0 fL   MCH 27.7 26.0 - 34.0 pg   MCHC 29.2 (L) 30.0 - 36.0 g/dL   RDW 86.5 88.4 - 84.4 %   Platelets 294 150 - 400 K/uL   nRBC 0.0 0.0 - 0.2 %    Comment: Performed at [facility], [address] [place], [place] [address]  Basic metabolic panel     Status: Abnormal   Collection Time: 05/26/24  7:01 AM  Result Value Ref Range   Sodium 140 135 - 145 mmol/L   Potassium 4.6 3.5 - 5.1 mmol/L   Chloride 107 98 - 111 mmol/L   CO2 21 (L) 22 - 32 mmol/L   Glucose, Bld 103 (H) 70 - 99 mg/dL    Comment: Glucose reference range applies only to samples taken after fasting for at least 8 hours.   BUN 42 (H) 8 - 23 mg/dL   Creatinine, Ser 7.72 (H) 0.44 - 1.00 mg/dL   Calcium  9.1 8.9 - 10.3 mg/dL   GFR, Estimated 24 (L) >60 mL/min    Comment: (NOTE) Calculated using the CKD-EPI Creatinine Equation (2021)    Anion gap 12 5 - 15    Comment: Performed at [facility], [address] [place], [place] [address]  Magnesium      Status: None   Collection Time: 05/26/24  7:01 AM  Result Value Ref Range   Magnesium  2.3 1.7 - 2.4 mg/dL    Comment: Performed at [facility], [address], [place], [place] [address]  Phosphorus     Status: None   Collection Time: 05/26/24  7:01 AM  Result Value Ref Range   Phosphorus 4.3 2.5 - 4.6 mg/dL    Comment: Performed at [facility], [address] [place], [place] [address]   DG Abd  Portable 1 View Result Date: 05/26/2024 EXAM: 1 VIEW XRAY OF THE ABDOMEN 05/26/2024 03:16:00 AM CLINICAL HISTORY: NG placement. NG placement FINDINGS: BOWEL: Nonobstructive bowel gas pattern. SOFT TISSUES: No opaque urinary calculi. Enteric tube in place with tip overlying the gastric lumen and proximal side port in the expected location of the gastroesophageal junction. BONES: No acute osseous abnormality. IMPRESSION: 1. Enteric tube in place with tip overlying the gastric lumen and proximal side port in the expected location of the gastroesophageal junction. The catheter could be advanced 5-10 cm for more optimal positioning. 2. Enlarged cardiac silhouette. Electronically signed by: Dorethia Molt MD 05/26/2024 03:26 AM EDT RP Workstation: [id number]   CT ABDOMEN PELVIS WO CONTRAST Result Date: 05/26/2024 EXAM: CT ABDOMEN AND PELVIS WITHOUT CONTRAST 05/25/2024 11:45:04 PM TECHNIQUE: CT of the abdomen  and pelvis was performed without the administration of intravenous contrast. Multiplanar reformatted images are provided for review. Automated exposure control, iterative reconstruction, and/or weight-based adjustment of the mA/kV was utilized to reduce the radiation dose to as low as reasonably achievable. COMPARISON: 03/14/2024 CLINICAL HISTORY: Abdominal pain, acute, nonlocalized. FINDINGS: LOWER CHEST: No acute abnormality. LIVER: The liver is unremarkable. GALLBLADDER AND BILE DUCTS: Gallbladder is unremarkable. No biliary ductal dilatation. SPLEEN: No acute abnormality. PANCREAS: No acute abnormality. ADRENAL GLANDS: Right adrenal mass measures 4.4 x 3.2 cm with an attenuation value of 3 HU. Normal left adrenal gland. KIDNEYS, URETERS AND BLADDER: No stones in the kidneys or ureters. No hydronephrosis. No perinephric or periureteral stranding. Urinary bladder is unremarkable. GI AND BOWEL: Stomach demonstrates no acute abnormality. There is dilated fluid-filled small bowel within the right abdomen and the  lower midline abdomen. A transition point appears to be within the anterior central abdomen (series 2, image 59). Normal appendix. PERITONEUM AND RETROPERITONEUM: No ascites. No free intraperitoneal air. VASCULATURE: Aorta is normal in caliber. Calcific aortic atherosclerosis. LYMPH NODES: No lymphadenopathy. REPRODUCTIVE ORGANS: No acute abnormality. BONES AND SOFT TISSUES: No acute osseous abnormality. No focal soft tissue abnormality. IMPRESSION: 1. Dilated fluid-filled small bowel with a transition point in the anterior central abdomen, concerning for small bowel obstruction. No free intraperitoneal air. 2. Right adrenal mass measuring 4.4 x 3.2 cm with attenuation of 3 HU, compatible with a lipid-rich adenoma. 3. Calcific aortic atherosclerosis. Electronically signed by: Franky Stanford MD 05/26/2024 12:16 AM EDT RP Workstation: [id number]   US  Abdomen Limited Result Date: 05/25/2024 CLINICAL DATA:  Right upper quadrant pain. EXAM: ULTRASOUND ABDOMEN LIMITED RIGHT UPPER QUADRANT COMPARISON:  CT 03/14/2024, ultrasound 02/21/2023 FINDINGS: Gallbladder: No gallstones or wall thickening visualized. No sonographic Murphy sign noted by sonographer. Common bile duct: Diameter: 3 mm. Liver: Heterogeneous and coarsened parenchymal echogenicity. Question of subtle capsular nodularity. Portal vein is patent on color Doppler imaging with normal direction of blood flow towards the liver. Other: Right upper quadrant free fluid. 4.1 cm extrahepatic structures likely known right adrenal adenoma. This was seen on prior CT, and unchanged in size. Fluid-filled bowel is adjacent to the gallbladder in right lobe of the liver. IMPRESSION: 1. No gallstones or biliary dilatation. 2. Heterogeneous coarsened hepatic echogenicity. Question of subtle capsular nodularity raises concern for cirrhosis. Trace perihepatic free fluid. 3. Peri hepatic rounded 4.1 cm lesion corresponds to known adrenal adenoma. 4. Suspected fluid-filled colon  adjacent to the liver and gallbladder. Electronically Signed   By: Andrea Gasman M.D.   On: 05/25/2024 22:59      Assessment/Plan SBO - no prior SBO, Hx of partial abdominal hysterectomy  - NGT in place and SBO protocol was ordered by admitting provider - pt reports improvement in abdominal pain and now passing flatus - will follow up 8h delay film - no indication for emergent surgical intervention at this time but we will follow   FEN: NPO, IVF per TRH VTE: SQH ID: no current abx  - per [facility]H -  HFpEF CKD stage IIIb Chronic bronchitis HTN HLD PVD GERD  I reviewed ED provider notes, hospitalist notes, last 24 h vitals and pain scores, last 48 h intake and output, last 24 h labs and trends, and last 24 h imaging results.  This care required high  level of medical decision making.   Burnard JONELLE Louder, [facility] 05/26/2024, 12:02 PM Please see Amion for pager number during day hours 7:00am-4:30pm

## 2024-05-26 NOTE — TOC Initial Note (Addendum)
 Transition of Care [facility]) - Initial/Assessment Note    Patient Details  Name: [name] MRN: [id number] Date of Birth: Mar 22, 1961  [facility]) CM/SW Contact:    Alfonse JONELLE Rex, RN Phone Number: 05/26/2024, 4:14 PM  Clinical Narrative:    Met with patient at bedside to introduce role of TOC/NCM and review for dc planning, patient confirmed she has an established PCP, no current home care services, reports she is undergoing OPPT at [facility] under her workers comp. Home DME: RW, W/C, Cane, BSC. Patient reports she resides alone with support from her son and her sister.  PT eval completed, recommendation for resumption of OPPT. TOC will continue to follow.                Expected Discharge Plan: Home/Self Care Barriers to Discharge: Continued Medical Work up   Patient Goals and CMS Choice Patient states their goals for this hospitalization and ongoing recovery are:: return home          Expected Discharge Plan and Services       Living arrangements for the past 2 months: Single Family Home                                      Prior Living Arrangements/Services Living arrangements for the past 2 months: Single Family Home Lives with:: Self Patient language and need for interpreter reviewed:: Yes Do you feel safe going back to the place where you live?: Yes      Need for Family Participation in Patient Care: Yes (Comment) Care giver support system in place?: Yes (comment) Current home services: DME (RW, W/C, Cane, BSC) Criminal Activity/Legal Involvement Pertinent to Current Situation/Hospitalization: No - Comment as needed  Activities of Daily Living      Permission Sought/Granted                  Emotional Assessment Appearance:: Appears stated age Attitude/Demeanor/Rapport: Engaged Affect (typically observed): Accepting Orientation: : Oriented to Self, Oriented to Place, Oriented to  Time, Oriented to  Situation Alcohol  / Substance Use: Not Applicable Psych Involvement: No (comment)  Admission diagnosis:  Small bowel obstruction (HCC) [K56.609] SBO (small bowel obstruction) (HCC) [K56.609] Patient Active Problem List   Diagnosis Date Noted   SBO (small bowel obstruction) (HCC) 05/26/2024   Other constipation 03/24/2024   Iron  deficiency anemia due to dietary causes 03/24/2024   COVID-19 vaccination declined 08/21/2023   Acute bilateral low back pain without sciatica 08/21/2023   Acute on chronic heart failure with preserved ejection fraction (HFpEF) (HCC) 07/25/2023   Hypertensive heart disease with acute on chronic diastolic congestive heart failure (HCC) 06/25/2023   Diverticulitis 06/10/2023   Need for influenza vaccination 06/10/2023   Herpes zoster vaccination declined 06/10/2023   Class 1 obesity due to excess calories with body mass index (BMI) of 33.0 to 33.9 in adult 06/10/2023   Insomnia due to medical condition 06/10/2023   (HFpEF) heart failure with preserved ejection fraction (HCC) 05/15/2023   Acute diverticulitis 05/15/2023   Pyuria, sterile 05/15/2023   Chronic kidney disease (CKD), stage IV (severe) (HCC) 05/15/2023   Closed right ankle fracture 04/18/2023   Tobacco dependence 03/07/2023   Rectal bleeding 02/22/2023   Anemia of chronic disease 02/21/2023   Bright red blood per rectum 02/21/2023   Microcytic anemia 02/21/2023   Murmur, heart 02/19/2023   Pica in adults  02/19/2023   Vomiting 02/19/2023   Claudication, intermittent 09/21/2022   Adrenal adenoma 01/11/2021   Left ventricular hypertrophy 01/11/2021   Cocaine use disorder (HCC) 12/05/2020   Atypical angina 10/11/2020   Stress 02/27/2019   Hypoglycemia without diagnosis of diabetes mellitus 09/06/2018   CKD (chronic kidney disease) stage 3, GFR 30-59 ml/min (HCC) 09/06/2018   Abdominal pain 09/06/2018   Chest pain 09/05/2018   Hypertensive urgency 09/05/2018   Wrist joint effusion, right  05/30/2017   Hypertension due to endocrine disorder 01/17/2017   Hyperaldosteronism 01/17/2017   AKI (acute kidney injury) 10/19/2015   Varicose veins of left lower extremity with complications 10/05/2015   Anemia 12/25/2014   Encounter for preventive care 11/11/2014   Tobacco abuse 11/17/2013   Diastolic dysfunction without heart failure 06/11/2013   GERD (gastroesophageal reflux disease) 06/11/2013   Primary hypertension 05/12/2013   Hyperlipidemia 05/12/2013   PCP:  Georgina Speaks, FNP Pharmacy:   [facility] [phone] [address], [place] - [address] AT [facility] [address] [place] [address] Phone: [phone] Fax: [phone]     Social Drivers of Health (SDOH) Social History: SDOH Screenings   Food Insecurity: No Food Insecurity (05/26/2024)  Housing: High Risk (05/26/2024)  Transportation Needs: No Transportation Needs (05/26/2024)  Utilities: Not At Risk (05/26/2024)  Alcohol  Screen: Low Risk  (03/05/2024)  Depression (PHQ2-9): Low Risk  (03/13/2024)  Financial Resource Strain: Low Risk  (03/05/2024)  Physical Activity: Inactive (03/05/2024)  Social Connections: Moderately Integrated (03/05/2024)  Stress: No Stress Concern Present (03/05/2024)  Tobacco Use: High Risk (05/12/2024)  Health Literacy: Adequate Health Literacy (03/05/2024)   SDOH Interventions:     Readmission Risk Interventions    05/26/2024    4:12 PM 07/26/2023    9:52 AM  Readmission Risk Prevention Plan  Transportation Screening Complete Complete  PCP or Specialist Appt within 3-5 Days Complete Complete  HRI or Home Care Consult Complete Complete  Social Work Consult for Recovery Care Planning/Counseling Complete Complete  Palliative Care Screening Not Applicable Not Applicable  Medication Review Oceanographer) Complete Complete

## 2024-05-26 NOTE — Progress Notes (Signed)
 OT Cancellation Note  Patient Details Name: [name] MRN: [id number] DOB: March 24, 1961   Cancelled Treatment:    Reason Eval/Treat Not Completed: Patient not medically ready (RN reports elevated BP and fatigue) Will f/u at a later time.  Belvie KATHEE Bud, MS, OTR/L 05/26/2024, 10:18 AM

## 2024-05-26 NOTE — Evaluation (Addendum)
 Physical Therapy Evaluation Patient Details Name: [name] MRN: [id number] DOB: 1961-02-19 Today's Date: 05/26/2024  History of Present Illness  63 year old female who presented to the ED with c/o upper abdominal pain with N/V.  Dx with SBO, AKI on CKD III, and uncontrolled HTN.  PMHx significant for recurrent hypertensive urgency, CHF (pEF), current smoker, CKD III, R ankle Fx (ORIF x3)  Clinical Impression  Pt admitted with above diagnosis.  Pt motivated to work with PT. Pt reports she typically amb with RW d/t her R ankle issues. Pt able to amb ~ 77' with RW and CGA fade to supervision, no LOB. Encouraged pt to continue to mobilize with staff as much as possible, will follow in acute setting, recommend pt return to OPPT post acute. BP prior to amb 163/88   Pt currently with functional limitations due to the deficits listed below (see PT Problem List). Pt will benefit from acute skilled PT to increase their independence and safety with mobility to allow discharge.           If plan is discharge home, recommend the following: A little help with walking and/or transfers;A little help with bathing/dressing/bathroom;Assist for transportation   Can travel by private vehicle        Equipment Recommendations None recommended by PT  Recommendations for Other Services       Functional Status Assessment Patient has had a recent decline in their functional status and demonstrates the ability to make significant improvements in function in a reasonable and predictable amount of time.     Precautions / Restrictions Precautions Precautions: Fall Precaution/Restrictions Comments: NGT, R ankle ASO, reports being in camboot at home Restrictions Weight Bearing Restrictions Per Provider Order: No      Mobility  Bed Mobility Overal bed mobility: Needs Assistance Bed Mobility: Supine to Sit     Supine to sit: Supervision     General bed mobility comments: for lines and safety     Transfers Overall transfer level: Needs assistance Equipment used: Rolling walker (2 wheels) Transfers: Sit to/from Stand, Bed to chair/wheelchair/BSC Sit to Stand: Contact guard assist   Step pivot transfers: Contact guard assist       General transfer comment: cues for hand placement, CGA for safety and line management    Ambulation/Gait Ambulation/Gait assistance: Contact guard assist, Supervision Gait Distance (Feet): 60 Feet Assistive device: Rolling walker (2 wheels) Gait Pattern/deviations: Step-to pattern, Decreased stance time - right, Wide base of support       General Gait Details: pt with steady gait with RW support, CGA fade to supervision for safety  Stairs            Wheelchair Mobility     Tilt Bed    Modified Rankin (Stroke Patients Only)       Balance Overall balance assessment: Needs assistance Sitting-balance support: No upper extremity supported, Feet supported Sitting balance-Leahy Scale: Good     Standing balance support: Bilateral upper extremity supported, Reliant on assistive device for balance, During functional activity Standing balance-Leahy Scale: Fair                               Pertinent Vitals/Pain Pain Assessment Pain Assessment: No/denies pain    Home Living Family/patient expects to be discharged to:: Private residence Living Arrangements: Alone Available Help at Discharge: Family (multiple children, dtr and son that help as needed) Type of Home: Apartment Home Access: Level entry  Home Layout: One level Home Equipment: Agricultural consultant (2 wheels);Wheelchair - manual Additional Comments: pt reports going to OPPT for R ankle    Prior Function               Mobility Comments: amb with RW most of the time       Extremity/Trunk Assessment   Upper Extremity Assessment Upper Extremity Assessment: Defer to OT evaluation    Lower Extremity Assessment Lower Extremity Assessment:  Overall WFL for tasks assessed;RLE deficits/detail RLE Deficits / Details: baseline ankle ROM limitations, able to df to neutral, ltd eversion/inversion       Communication   Communication Communication: No apparent difficulties    Cognition Arousal: Alert Behavior During Therapy: WFL for tasks assessed/performed   PT - Cognitive impairments: No apparent impairments                         Following commands: Intact       Cueing Cueing Techniques: Verbal cues     General Comments      Exercises Other Exercises Other Exercises: right ankle df stretch,  ankle inversion/eversion, pf/df   Assessment/Plan    PT Assessment Patient needs continued PT services  PT Problem List Decreased balance;Decreased activity tolerance;Decreased mobility;Decreased knowledge of use of DME       PT Treatment Interventions DME instruction;Therapeutic activities;Functional mobility training;Therapeutic exercise;Patient/family education    PT Goals (Current goals can be found in the Care Plan section)  Acute Rehab PT Goals Patient Stated Goal: get back to OPPT for my ankle and not have abd surgery PT Goal Formulation: With patient Time For Goal Achievement: 06/09/24 Potential to Achieve Goals: Good    Frequency Min 3X/week     Co-evaluation               AM-PAC PT 6 Clicks Mobility  Outcome Measure Help needed turning from your back to your side while in a flat bed without using bedrails?: None Help needed moving from lying on your back to sitting on the side of a flat bed without using bedrails?: None Help needed moving to and from a bed to a chair (including a wheelchair)?: A Little Help needed standing up from a chair using your arms (e.g., wheelchair or bedside chair)?: A Little Help needed to walk in hospital room?: A Little Help needed climbing 3-5 steps with a railing? : A Little 6 Click Score: 20    End of Session Equipment Utilized During Treatment: Gait  belt Activity Tolerance: Patient tolerated treatment well Patient left: in chair;with call bell/phone within reach Nurse Communication: Mobility status PT Visit Diagnosis: Other abnormalities of gait and mobility (R26.89)    Time: 8798-8774 PT Time Calculation (min) (ACUTE ONLY): 24 min   Charges:   PT Evaluation $PT Eval Low Complexity: 1 Low PT Treatments $Gait Training: 8-22 mins PT General Charges $$ ACUTE PT VISIT: 1 Visit         Rexene, PT  Acute Rehab Dept [facility]) [phone]  05/26/2024   [facility] 05/26/2024, 12:51 PM

## 2024-05-26 NOTE — Progress Notes (Signed)
 PROGRESS NOTE    [name]  FMW:[id number] DOB: 07-05-1961 DOA: 05/25/2024 PCP: Georgina Speaks, FNP   Brief Narrative: 63 yo PMH heart failure preserved ejection fraction on Lasix , hypertension, hyperlipidemia, CKD 3B, GERD, history of right ankle fracture status post 3 surgery, presents complaining of abdominal pain associated with nausea and vomiting.  Evaluation in the ED CT abdomen and pelvis without contrast revealed dilated fluid-filled small bowel with transition point in the anterior central abdomen concerning for small bowel obstruction.   Patient admitted for SBO.  Surgery consulted.   Assessment & Plan:   Principal Problem:   SBO (small bowel obstruction) (HCC)   1-SBO: Presents with abdominal pain nausea vomiting.  CT abdomen showed dilated small bowel with transition point anterior central abdomen. -Continue IV fluids - Continue NG tube - Surgery consulted, started small bowel protocol - Patient reports passing some gas  2-AKI on CKD 3B In the setting of poor oral intake and vomiting Creatinine baseline 1.8 Presented with a creatinine of 2.5 Continue IV fluids  3-Hypertension, urgency: Unable to tolerate oral medication Will schedule IV hydralazine  20 mg 3 times daily Will schedule IV metoprolol 5 mg  3 times daily PRN hydralazine .   Elevated lipase: Could be in the setting of AKI.  Monitor  Non anion gap metabolic acidosis: In the setting of vomiting. Continue IV fluids  Anemia of chronic disease: Monitor hemoglobin  History of right ankle fracture status post 3 surgery PT OT consulted  Right adrenal mass measuring 4 x 3 cm.  Incidental finding on CT will need outpatient follow-up   Estimated body mass index is 35.43 kg/m as calculated from the following:   Height as of 05/12/24: 5' 8 (1.727 m).   Weight as of 05/12/24: 105.7 kg.   DVT prophylaxis: Heparin  Code Status: Full code Family Communication: Care discussed with  patient Disposition Plan:  Status is: Inpatient Remains inpatient appropriate because: management of SBO    Consultants:  Surgery   Procedures:  none  Antimicrobials:    Subjective: She will wake up to voice, reports some improvement of abdominal pain.  Passing some gas.  She currently has NG tube in place   Objective: Vitals:   05/26/24 0345 05/26/24 0400 05/26/24 0515 05/26/24 0808  BP: (!) 186/88 (!) 153/74 (!) 169/102 (!) 188/104  Pulse:  87 81   Resp:  16 17   Temp:   98 F (36.7 C)   TempSrc:   Oral   SpO2:  98% 98%     Intake/Output Summary (Last 24 hours) at 05/26/2024 0901 Last data filed at 05/26/2024 0500 Gross per 24 hour  Intake 90 ml  Output 100 ml  Net -10 ml   There were no vitals filed for this visit.  Examination:  General exam: Appears calm and comfortable  Respiratory system: Clear to auscultation. Respiratory effort normal. Cardiovascular system: S1 & S2 heard, RRR. No JVD, murmurs, rubs, gallops or clicks. No pedal edema. Gastrointestinal system: Abdomen is nondistended, soft, bowel hyperactive, mild tender. Central nervous system: Alert and oriented.  Moves extremities  Extremities: Symmetric 5 x 5 power.   Data Reviewed: I have personally reviewed following labs and imaging studies  CBC: Recent Labs  Lab 05/25/24 2216 05/26/24 0701  WBC 10.7* 10.2  HGB 11.5* 11.2*  HCT 39.8 38.3  MCV 93.0 94.8  PLT 279 294   Basic Metabolic Panel: Recent Labs  Lab 05/25/24 2216 05/26/24 0701  NA 137 140  K 4.8 4.6  CL  104 107  CO2 19* 21*  GLUCOSE 122* 103*  BUN 45* 42*  CREATININE 2.58* 2.27*  CALCIUM  9.5 9.1  MG  --  2.3  PHOS  --  4.3   GFR: Estimated Creatinine Clearance: 32.3 mL/min (A) (by C-G formula based on SCr of 2.27 mg/dL (H)). Liver Function Tests: Recent Labs  Lab 05/25/24 2216  AST 38  ALT 40  ALKPHOS 121  BILITOT 0.6  PROT 8.2*  ALBUMIN 4.4   Recent Labs  Lab 05/25/24 2216 05/26/24 0701  LIPASE 182*  167*   No results for input(s): AMMONIA in the last 168 hours. Coagulation Profile: No results for input(s): INR, PROTIME in the last 168 hours. Cardiac Enzymes: No results for input(s): CKTOTAL, CKMB, CKMBINDEX, TROPONINI in the last 168 hours. BNP (last 3 results) No results for input(s): PROBNP in the last 8760 hours. HbA1C: No results for input(s): HGBA1C in the last 72 hours. CBG: No results for input(s): GLUCAP in the last 168 hours. Lipid Profile: No results for input(s): CHOL, HDL, LDLCALC, TRIG, CHOLHDL, LDLDIRECT in the last 72 hours. Thyroid Function Tests: No results for input(s): TSH, T4TOTAL, FREET4, T3FREE, THYROIDAB in the last 72 hours. Anemia Panel: No results for input(s): VITAMINB12, FOLATE, FERRITIN, TIBC, IRON , RETICCTPCT in the last 72 hours. Sepsis Labs: No results for input(s): PROCALCITON, LATICACIDVEN in the last 168 hours.  No results found for this or any previous visit (from the past 240 hours).       Radiology Studies: DG Abd Portable 1 View Result Date: 05/26/2024 EXAM: 1 VIEW XRAY OF THE ABDOMEN 05/26/2024 03:16:00 AM CLINICAL HISTORY: NG placement. NG placement FINDINGS: BOWEL: Nonobstructive bowel gas pattern. SOFT TISSUES: No opaque urinary calculi. Enteric tube in place with tip overlying the gastric lumen and proximal side port in the expected location of the gastroesophageal junction. BONES: No acute osseous abnormality. IMPRESSION: 1. Enteric tube in place with tip overlying the gastric lumen and proximal side port in the expected location of the gastroesophageal junction. The catheter could be advanced 5-10 cm for more optimal positioning. 2. Enlarged cardiac silhouette. Electronically signed by: Dorethia Molt MD 05/26/2024 03:26 AM EDT RP Workstation: [id number]   CT ABDOMEN PELVIS WO CONTRAST Result Date: 05/26/2024 EXAM: CT ABDOMEN AND PELVIS WITHOUT CONTRAST 05/25/2024 11:45:04 PM  TECHNIQUE: CT of the abdomen and pelvis was performed without the administration of intravenous contrast. Multiplanar reformatted images are provided for review. Automated exposure control, iterative reconstruction, and/or weight-based adjustment of the mA/kV was utilized to reduce the radiation dose to as low as reasonably achievable. COMPARISON: 03/14/2024 CLINICAL HISTORY: Abdominal pain, acute, nonlocalized. FINDINGS: LOWER CHEST: No acute abnormality. LIVER: The liver is unremarkable. GALLBLADDER AND BILE DUCTS: Gallbladder is unremarkable. No biliary ductal dilatation. SPLEEN: No acute abnormality. PANCREAS: No acute abnormality. ADRENAL GLANDS: Right adrenal mass measures 4.4 x 3.2 cm with an attenuation value of 3 HU. Normal left adrenal gland. KIDNEYS, URETERS AND BLADDER: No stones in the kidneys or ureters. No hydronephrosis. No perinephric or periureteral stranding. Urinary bladder is unremarkable. GI AND BOWEL: Stomach demonstrates no acute abnormality. There is dilated fluid-filled small bowel within the right abdomen and the lower midline abdomen. A transition point appears to be within the anterior central abdomen (series 2, image 59). Normal appendix. PERITONEUM AND RETROPERITONEUM: No ascites. No free intraperitoneal air. VASCULATURE: Aorta is normal in caliber. Calcific aortic atherosclerosis. LYMPH NODES: No lymphadenopathy. REPRODUCTIVE ORGANS: No acute abnormality. BONES AND SOFT TISSUES: No acute osseous abnormality. No focal soft tissue  abnormality. IMPRESSION: 1. Dilated fluid-filled small bowel with a transition point in the anterior central abdomen, concerning for small bowel obstruction. No free intraperitoneal air. 2. Right adrenal mass measuring 4.4 x 3.2 cm with attenuation of 3 HU, compatible with a lipid-rich adenoma. 3. Calcific aortic atherosclerosis. Electronically signed by: Franky Stanford MD 05/26/2024 12:16 AM EDT RP Workstation: [id number]   US  Abdomen Limited Result Date:  05/25/2024 CLINICAL DATA:  Right upper quadrant pain. EXAM: ULTRASOUND ABDOMEN LIMITED RIGHT UPPER QUADRANT COMPARISON:  CT 03/14/2024, ultrasound 02/21/2023 FINDINGS: Gallbladder: No gallstones or wall thickening visualized. No sonographic Murphy sign noted by sonographer. Common bile duct: Diameter: 3 mm. Liver: Heterogeneous and coarsened parenchymal echogenicity. Question of subtle capsular nodularity. Portal vein is patent on color Doppler imaging with normal direction of blood flow towards the liver. Other: Right upper quadrant free fluid. 4.1 cm extrahepatic structures likely known right adrenal adenoma. This was seen on prior CT, and unchanged in size. Fluid-filled bowel is adjacent to the gallbladder in right lobe of the liver. IMPRESSION: 1. No gallstones or biliary dilatation. 2. Heterogeneous coarsened hepatic echogenicity. Question of subtle capsular nodularity raises concern for cirrhosis. Trace perihepatic free fluid. 3. Peri hepatic rounded 4.1 cm lesion corresponds to known adrenal adenoma. 4. Suspected fluid-filled colon adjacent to the liver and gallbladder. Electronically Signed   By: Andrea Gasman M.D.   On: 05/25/2024 22:59        Scheduled Meds:  bisacodyl   10 mg Rectal Once   heparin   5,000 Units Subcutaneous Q8H   Continuous Infusions:  lactated ringers  125 mL/hr at 05/26/24 0529     LOS: 0 days    Time spent: 35 Minutes    Etsuko Dierolf A Elohim Brune, MD [facility]   If 7PM-7AM, please contact night-coverage [email or web address]  05/26/2024, 9:01 AM

## 2024-05-26 NOTE — Plan of Care (Signed)
  Problem: Education: Goal: Knowledge of General Education information will improve Description: Including pain rating scale, medication(s)/side effects and non-pharmacologic comfort measures Outcome: Progressing   Problem: Health Behavior/Discharge Planning: Goal: Ability to manage health-related needs will improve Outcome: Progressing   Problem: Clinical Measurements: Goal: Ability to maintain clinical measurements within normal limits will improve Outcome: Progressing Goal: Will remain free from infection Outcome: Progressing Goal: Diagnostic test results will improve Outcome: Progressing Goal: Respiratory complications will improve Outcome: Progressing Goal: Cardiovascular complication will be avoided Outcome: Progressing   Problem: Clinical Measurements: Goal: Will remain free from infection Outcome: Progressing   Problem: Coping: Goal: Level of anxiety will decrease Outcome: Progressing   Problem: Nutrition: Goal: Adequate nutrition will be maintained Outcome: Progressing   Problem: Elimination: Goal: Will not experience complications related to bowel motility Outcome: Progressing Goal: Will not experience complications related to urinary retention Outcome: Progressing

## 2024-05-26 NOTE — Evaluation (Signed)
 Occupational Therapy Evaluation Patient Details Name: [name] MRN: [id number] DOB: 1961/08/03 Today's Date: 05/26/2024   History of Present Illness   Patient is a 63 year old female who presented to the ED with c/o upper abdominal pain with N/V.  Dx with SBO, AKI on CKD III, and uncontrolled HTN.  PMHx significant for recurrent hypertensive urgency, CHF (pEF), current smoker, CKD III, R ankle Fx (ORIF x3)     Clinical Impressions PTA, patient was ModI with ADL and IADL tasks at 2WW level at home where she lives alone, including participation in outpatient PT for right ankle Fx ORIF.  Patient presents with decreased functional activity tolerance and completes toilet transfers at [facility].  Patient also reports dropping items from left hand, confirming that she has difficulty regulating grip force and experiences mild paresthesia in digits I - III.  Patient can benefit from acute OT services to promote increased activity tolerance and performance of functional transfers and to address potential fine motor coordination & control deficit in left hand to support safe return home at [facility]  OT services are not anticipated post-discharge from hospital.     If plan is discharge home, recommend the following:   Assist for transportation     Functional Status Assessment   Patient has had a recent decline in their functional status and demonstrates the ability to make significant improvements in function in a reasonable and predictable amount of time.     Equipment Recommendations   None recommended by OT      Precautions/Restrictions   Precautions Precautions: Fall Recall of Precautions/Restrictions: Intact Precaution/Restrictions Comments: OG tube right nare Required Braces or Orthoses: Other Brace Other Brace: Rignt ankle ASO Restrictions Weight Bearing Restrictions Per Provider Order: No Other Position/Activity Restrictions: Patient reports TTWB R ankle        Balance  Overall balance assessment: Mild deficits observed, not formally tested Sitting-balance support: No upper extremity supported, Feet unsupported Sitting balance-Leahy Scale: Good Sitting balance - Comments: Good - demonstrated mild posterior lean during MMT     ADL either performed or assessed with clinical judgement   ADL Overall ADL's : Needs assistance/impaired Eating/Feeding:  (Nursing reports patient was upgraded to clear liquid diet this date.)   Grooming: Modified independent   Upper Body Bathing: Modified independent;Sitting   Lower Body Bathing: Modified independent;Sitting/lateral leans   Upper Body Dressing : Modified independent;Sitting   Lower Body Dressing: Modified independent;Sitting/lateral leans   Toilet Transfer: Contact guard assist   Toileting- Clothing Manipulation and Hygiene: Modified independent;Sit to/from stand   Functional mobility during ADLs: Contact guard assist       Vision Baseline Vision/History: 0 No visual deficits Ability to See in Adequate Light: 0 Adequate Patient Visual Report: No change from baseline Vision Assessment?: No apparent visual deficits            Pertinent Vitals/Pain Pain Assessment Pain Assessment: No/denies pain     Extremity/Trunk Assessment Upper Extremity Assessment Upper Extremity Assessment: Overall WFL for tasks assessed;Generalized weakness;Right hand dominant;LUE deficits/detail (BUE MMT grossly 4-/5) LUE Deficits / Details: Patient reports dropping items from left hand occasionally; light touch decreased in digits I - III LUE Sensation: decreased light touch LUE Coordination: decreased fine motor   Lower Extremity Assessment Lower Extremity Assessment: Defer to PT evaluation RLE Deficits / Details: baseline ankle ROM limitations, able to df to neutral, ltd eversion/inversion      Communication Communication Communication: No apparent difficulties   Cognition Arousal: Alert (Patient observed to be  moderately drowsy; reported to be due to medication.) Behavior During Therapy: [facility] for tasks assessed/performed Cognition: No apparent impairments   OT - Cognition Comments: Oriented x4   Following commands: Intact                  Home Living Family/patient expects to be discharged to:: Private residence Living Arrangements: Alone Available Help at Discharge: Family Type of Home: Apartment Marketing executive floor) Home Access: Level entry    Home Layout: One level    Bathroom Shower/Tub: Chief Strategy Officer: Standard Bathroom Accessibility: No   Home Equipment: Agricultural consultant (2 wheels);Cane - single point;Tub bench;BSC/3in1;Wheelchair - manual   Additional Comments: pt reports going to OPPT for R ankle      Prior Functioning/Environment Prior Level of Function : Independent/Modified Independent   Mobility Comments: Patient reports using SPC, 2WW, and WC for household mobility; relies primarily on sister for community mobility ADLs Comments: Completes ADLs independently; sister usually brings meals.    OT Problem List: Decreased strength;Decreased activity tolerance;Decreased coordination;Impaired sensation   OT Treatment/Interventions: Self-care/ADL training;Therapeutic exercise;Energy conservation;Therapeutic activities;Patient/family education      OT Goals(Current goals can be found in the care plan section)   Acute Rehab OT Goals Patient Stated Goal: To feel better and return home at [facility]. OT Goal Formulation: With patient Time For Goal Achievement: 06/09/24 Potential to Achieve Goals: Good ADL Goals Pt Will Perform Lower Body Dressing: Independently;sitting/lateral leans Pt Will Transfer to Toilet: with modified independence Pt/caregiver will Perform Home Exercise Program: Left upper extremity;Independently;With written HEP provided (Increased sensation)   OT Frequency:  Min 2X/week       AM-PAC OT 6 Clicks Daily Activity     Outcome  Measure Help from another person eating meals?: None Help from another person taking care of personal grooming?: None Help from another person toileting, which includes using toliet, bedpan, or urinal?: A Little Help from another person bathing (including washing, rinsing, drying)?: None Help from another person to put on and taking off regular upper body clothing?: None Help from another person to put on and taking off regular lower body clothing?: None 6 Click Score: 23   End of Session    Activity Tolerance: Patient tolerated treatment well Patient left: in bed;with call bell/phone within reach;with bed alarm set;with nursing/sitter in room  OT Visit Diagnosis: Muscle weakness (generalized) (M62.81)                Time: 1437-1500 OT Time Calculation (min): 23 min Charges:  OT General Charges $OT Visit: 1 Visit OT Evaluation $OT Eval Moderate Complexity: 1 Mod OT Treatments $Therapeutic Activity: 8-22 mins  Summer Madewell B. Aviyah Swetz, MS, OTR/L  05/26/2024, 4:13 PM

## 2024-05-26 NOTE — Progress Notes (Signed)
   05/26/24 0944  Assess: MEWS Score  Temp 98 F (36.7 C)  BP (!) 216/103  MAP (mmHg) 91  Pulse Rate 81  Resp 17  Level of Consciousness Alert  SpO2 98 %  O2 Device Room Air  Assess: MEWS Score  MEWS Temp 0  MEWS Systolic 2  MEWS Pulse 0  MEWS RR 0  MEWS LOC 0  MEWS Score 2  MEWS Score Color Yellow  Assess: if the MEWS score is Yellow or Red  Were vital signs accurate and taken at a resting state? Yes  Assess: SIRS CRITERIA  SIRS Temperature  0  SIRS Respirations  0  SIRS Pulse 0  SIRS WBC 0  SIRS Score Sum  0

## 2024-05-26 NOTE — Progress Notes (Signed)
   05/26/24 0944  Vitals  Temp 98 F (36.7 C)  BP (!) 216/103  MAP (mmHg) 91  Pulse Rate 81  Resp 17  Level of Consciousness  Level of Consciousness Alert  MEWS COLOR  MEWS Score Color Yellow  Oxygen Therapy  SpO2 98 %  O2 Device Room Air  Pain Assessment  Pain Scale 0-10  Pain Score 0  Pain Type Acute pain  MEWS Score  MEWS Temp 0  MEWS Systolic 2  MEWS Pulse 0  MEWS RR 0  MEWS LOC 0  MEWS Score 2  Provider Notification  Provider Name/Title Regalado  Date Provider Notified 05/26/24  Time Provider Notified 0945  Method of Notification Face-to-face  Notification Reason Other (Comment) (bp elevated)  Date of Provider Response 05/26/24  Time of Provider Response 0945

## 2024-05-26 NOTE — H&P (Addendum)
 History and Physical  [name] FMW:[id number] DOB: 12-06-1960 DOA: 05/25/2024  Referring physician: Dr. Carita, EDP  PCP: Georgina Speaks, FNP  Outpatient Specialists: None Patient coming from: Home  Chief Complaint: Upper abdominal pain, nausea and vomiting.   HPI: [name] is a 63 y.o. female with medical history significant for chronic HFpEF on Lasix  prior to admission, hypertension, hyperlipidemia, CKD 3B, GERD, history of right ankle fracture status post 3 surgeries, who presents to the ER due to upper abdominal pain.  Associated with nausea and vomiting.  Symptoms started today around 1 PM while at church.  Unable to keep anything down.  Had a partial hysterectomy 32 years ago.  Last bowel movement was today after taking MiraLAX .  No diarrhea.  No cardiopulmonary symptoms.  No reported subjective fevers or chills.  EMS was activated.  In the ER, severely hypertensive with SBP in the 220s.  CT abdomen and pelvis without contrast revealed dilated fluid-filled small bowel with a transition point in the anterior central abdomen, concerning for small bowel obstruction.  No free intraperitoneal air.  Right adrenal mass measuring 4.4 x 3.2 cm with attenuation of 3 HU, compatible with a lipid rich adenoma.  Calcific aortic atherosclerosis.  The patient received IV fluid hydration, IV analgesics and IV antiemetics.  Admitted for further management of SBO.  EDP consulted general surgery on-call.  ED Course: Temperature 98.3.  BP 224/94, pulse 79, respiratory rate 20, O2 saturation 97% on room air.  Lab studies notable for WBC 10.7, hemoglobin 11.5, platelet count 279.  Serum bicarb 19, glucose 122, BUN 45, creatinine 2.58, lipase 182, GFR 20.  Review of Systems: Review of systems as noted in the HPI. All other systems reviewed and are negative.   Past Medical History:  Diagnosis Date   (HFpEF) heart failure with preserved ejection fraction (HCC) 05/15/2023   Adrenal adenoma  01/11/2021   Anemia    CHF (congestive heart failure) (HCC)    Chronic bronchitis (HCC)    probably once/yr (05/12/2013)   Chronic kidney disease    Diastolic heart failure    Dyslipidemia    pt denies this hx on 05/12/2013   Exertional shortness of breath    GERD (gastroesophageal reflux disease)    Heart murmur    Hypertension    Hypokalemia    Left ventricular hypertrophy 01/11/2021   Primary hypertension 05/12/2013   Venous stasis ulcers (HCC)    Past Surgical History:  Procedure Laterality Date   ANKLE FUSION Right 02/19/2024   Procedure: RIGHT ANKLE ARTHRODESIS;  Surgeon: Elsa Lonni SAUNDERS, MD;  Location: [facility] OR;  Service: Orthopedics;  Laterality: Right;   BIOPSY  02/23/2023   Procedure: BIOPSY;  Surgeon: Wilhelmenia Aloha Raddle., MD;  Location: [facility] ENDOSCOPY;  Service: Gastroenterology;;   COLONOSCOPY N/A 02/23/2023   Procedure: COLONOSCOPY;  Surgeon: Wilhelmenia Aloha Raddle., MD;  Location: [facility] ENDOSCOPY;  Service: Gastroenterology;  Laterality: N/A;   ESOPHAGOGASTRODUODENOSCOPY N/A 02/23/2023   Procedure: ESOPHAGOGASTRODUODENOSCOPY (EGD);  Surgeon: Wilhelmenia Aloha Raddle., MD;  Location: [facility] ENDOSCOPY;  Service: Gastroenterology;  Laterality: N/A;   HARDWARE REMOVAL Right 05/08/2023   Procedure: HARDWARE REMOVAL MEDIAL AND LATERAL;  Surgeon: Elsa Lonni SAUNDERS, MD;  Location: [facility] ORS;  Service: Orthopedics;  Laterality: Right;   HARDWARE REMOVAL Right 02/19/2024   Procedure: HARDWARE REMOVAL RIGHT ANKLE;  Surgeon: Elsa Lonni SAUNDERS, MD;  Location: [facility] OR;  Service: Orthopedics;  Laterality: Right;   HOT HEMOSTASIS N/A 02/23/2023   Procedure: HOT HEMOSTASIS (ARGON PLASMA COAGULATION/BICAP);  Surgeon: Wilhelmenia,  Aloha Raddle., MD;  Location: [facility] ENDOSCOPY;  Service: Gastroenterology;  Laterality: N/A;   IR US  GUIDE VASC ACCESS RIGHT  02/27/2017   IR VENOGRAM ADRENAL BI  02/27/2017   IR VENOGRAM HEPATIC WO HEMODYNAMIC EVALUATION  02/27/2017   IR VENOGRAM RENAL BI  02/27/2017   IR VENOUS  SAMPLING  02/27/2017   IR VENOUS SAMPLING  02/27/2017   ORIF ANKLE FRACTURE Right 04/18/2023   Procedure: OPEN REDUCTION INTERNAL FIXATION (ORIF) ANKLE FRACTURE;  Surgeon: Edna Toribio LABOR, MD;  Location: [facility] ORS;  Service: Orthopedics;  Laterality: Right;   ORIF ANKLE FRACTURE Right 05/08/2023   Procedure: REVISION OPEN REDUCTION INTERNAL FIXATION (ORIF) OF RIGHT TRIMALLEOLAR ANKLE FRACTURE, OPEN TREATMENT OF SYNDESMOSIS;  Surgeon: Elsa Lonni SAUNDERS, MD;  Location: [facility] ORS;  Service: Orthopedics;  Laterality: Right;   POLYPECTOMY  02/23/2023   Procedure: POLYPECTOMY;  Surgeon: Wilhelmenia Aloha Raddle., MD;  Location: [facility] ENDOSCOPY;  Service: Gastroenterology;;   SYNDESMOSIS REPAIR Right 05/08/2023   Procedure: SYNDESMOSIS REPAIR;  Surgeon: Elsa Lonni SAUNDERS, MD;  Location: [facility] ORS;  Service: Orthopedics;  Laterality: Right;   VAGINAL HYSTERECTOMY  1990's    Social History:  reports that she has been smoking cigarettes. She has a 5 pack-year smoking history. She has never used smokeless tobacco. She reports that she does not currently use alcohol . She reports current drug use. Drug: Oxycodone .   Allergies  Allergen Reactions   Oxycodone  Other (See Comments)    Severe constipation    Lisinopril  Cough    Family History  Problem Relation Age of Onset   Hypertension Mother    Kidney failure Mother    Heart attack Father    Hypertension Sister    Diabetes Sister    Hypertension Brother    Diabetes Sister       Prior to Admission medications   Medication Sig Start Date End Date Taking? Authorizing Provider  acetaminophen  (TYLENOL ) 325 MG tablet Take 650 mg by mouth every 6 (six) hours as needed for mild pain or headache.    [provider]  amLODipine  (NORVASC ) 10 MG tablet TAKE 1 TABLET(10 MG) BY MOUTH DAILY 12/04/23   Petrina Pries, NP  aspirin  (BAYER ASPIRIN ) 325 MG tablet Take 1 tablet by mouth for 30 DAYS for blood clot prevention. Start post op day one 02/19/24   [place],  Jesse J, PA-C  aspirin  EC 81 MG tablet Take 81 mg by mouth daily. Swallow whole. Patient not taking: Reported on 05/12/2024    [provider]  atorvastatin  (LIPITOR) 80 MG tablet Take 1 tablet (80 mg total) by mouth daily. 03/28/24   Raford Riggs, MD  carvedilol  (COREG ) 12.5 MG tablet Take 1 tablet (12.5 mg total) by mouth 2 (two) times daily with a meal. 06/06/23   Raford Riggs, MD  cetirizine (ZYRTEC) 10 MG tablet Take 10 mg by mouth daily as needed for allergies.    [provider]  cyclobenzaprine  (FEXMID ) 7.5 MG tablet Take 7.5 mg by mouth 3 (three) times daily as needed. 04/28/24   [provider]  Diclofenac Sodium 3 % GEL SMARTSIG:1-2 Gram(s) Topical 3-4 Times Daily 04/28/24   [provider]  furosemide  (LASIX ) 40 MG tablet TAKE 1 TABLET(40 MG) BY MOUTH DAILY 02/11/24   Georgina Speaks, FNP  hydrALAZINE  (APRESOLINE ) 50 MG tablet Take 1 tablet (50 mg total) by mouth 3 (three) times daily. 10/09/23   Georgina Speaks, FNP  Iron -FA-B Cmp-C-Biot-Probiotic (FUSION PLUS) CAPS Take 1 capsule by mouth daily. 03/13/24   Petrina,  Bruna, NP  pantoprazole  (PROTONIX ) 40 MG tablet Take 40 mg by mouth daily. 09/27/23   [provider]  polyethylene glycol (MIRALAX  / GLYCOLAX ) 17 g packet Take 17 g by mouth daily as needed for mild constipation or moderate constipation. 03/13/24   Petrina Bruna, NP  spironolactone  (ALDACTONE ) 25 MG tablet TAKE 1 TABLET BY MOUTH DAILY 03/26/24   Georgina Speaks, FNP    Physical Exam: BP (!) 221/95   Pulse 68   Temp 98.3 F (36.8 C)   Resp 15   SpO2 98%   General: 63 y.o. year-old female well developed well nourished in no acute distress.  Alert and oriented x3. Cardiovascular: Regular rate and rhythm with no rubs or gallops.  No thyromegaly or JVD noted.  No lower extremity edema. 2/4 pulses in all 4 extremities. Respiratory: Clear to auscultation with no wheezes or rales. Good inspiratory effort. Abdomen: Soft diffusely tender with  hypoactive bowel sounds.   Muskuloskeletal: No cyanosis, clubbing or edema noted bilaterally Neuro: CN II-XII intact, strength, sensation, reflexes Skin: No ulcerative lesions noted or rashes Psychiatry: Judgement and insight appear normal. Mood is appropriate for condition and setting          Labs on Admission:  Basic Metabolic Panel: Recent Labs  Lab 05/25/24 2216  NA 137  K 4.8  CL 104  CO2 19*  GLUCOSE 122*  BUN 45*  CREATININE 2.58*  CALCIUM  9.5   Liver Function Tests: Recent Labs  Lab 05/25/24 2216  AST 38  ALT 40  ALKPHOS 121  BILITOT 0.6  PROT 8.2*  ALBUMIN 4.4   Recent Labs  Lab 05/25/24 2216  LIPASE 182*   No results for input(s): AMMONIA in the last 168 hours. CBC: Recent Labs  Lab 05/25/24 2216  WBC 10.7*  HGB 11.5*  HCT 39.8  MCV 93.0  PLT 279   Cardiac Enzymes: No results for input(s): CKTOTAL, CKMB, CKMBINDEX, TROPONINI in the last 168 hours.  BNP (last 3 results) Recent Labs    07/25/23 1527 03/14/24 1101  BNP 240.9* 83.4    ProBNP (last 3 results) No results for input(s): PROBNP in the last 8760 hours.  CBG: No results for input(s): GLUCAP in the last 168 hours.  Radiological Exams on Admission: CT ABDOMEN PELVIS WO CONTRAST Result Date: 05/26/2024 EXAM: CT ABDOMEN AND PELVIS WITHOUT CONTRAST 05/25/2024 11:45:04 PM TECHNIQUE: CT of the abdomen and pelvis was performed without the administration of intravenous contrast. Multiplanar reformatted images are provided for review. Automated exposure control, iterative reconstruction, and/or weight-based adjustment of the mA/kV was utilized to reduce the radiation dose to as low as reasonably achievable. COMPARISON: 03/14/2024 CLINICAL HISTORY: Abdominal pain, acute, nonlocalized. FINDINGS: LOWER CHEST: No acute abnormality. LIVER: The liver is unremarkable. GALLBLADDER AND BILE DUCTS: Gallbladder is unremarkable. No biliary ductal dilatation. SPLEEN: No acute abnormality.  PANCREAS: No acute abnormality. ADRENAL GLANDS: Right adrenal mass measures 4.4 x 3.2 cm with an attenuation value of 3 HU. Normal left adrenal gland. KIDNEYS, URETERS AND BLADDER: No stones in the kidneys or ureters. No hydronephrosis. No perinephric or periureteral stranding. Urinary bladder is unremarkable. GI AND BOWEL: Stomach demonstrates no acute abnormality. There is dilated fluid-filled small bowel within the right abdomen and the lower midline abdomen. A transition point appears to be within the anterior central abdomen (series 2, image 59). Normal appendix. PERITONEUM AND RETROPERITONEUM: No ascites. No free intraperitoneal air. VASCULATURE: Aorta is normal in caliber. Calcific aortic atherosclerosis. LYMPH NODES: No lymphadenopathy. REPRODUCTIVE ORGANS: No acute  abnormality. BONES AND SOFT TISSUES: No acute osseous abnormality. No focal soft tissue abnormality. IMPRESSION: 1. Dilated fluid-filled small bowel with a transition point in the anterior central abdomen, concerning for small bowel obstruction. No free intraperitoneal air. 2. Right adrenal mass measuring 4.4 x 3.2 cm with attenuation of 3 HU, compatible with a lipid-rich adenoma. 3. Calcific aortic atherosclerosis. Electronically signed by: Franky Stanford MD 05/26/2024 12:16 AM EDT RP Workstation: [id number]   US  Abdomen Limited Result Date: 05/25/2024 CLINICAL DATA:  Right upper quadrant pain. EXAM: ULTRASOUND ABDOMEN LIMITED RIGHT UPPER QUADRANT COMPARISON:  CT 03/14/2024, ultrasound 02/21/2023 FINDINGS: Gallbladder: No gallstones or wall thickening visualized. No sonographic Murphy sign noted by sonographer. Common bile duct: Diameter: 3 mm. Liver: Heterogeneous and coarsened parenchymal echogenicity. Question of subtle capsular nodularity. Portal vein is patent on color Doppler imaging with normal direction of blood flow towards the liver. Other: Right upper quadrant free fluid. 4.1 cm extrahepatic structures likely known right adrenal  adenoma. This was seen on prior CT, and unchanged in size. Fluid-filled bowel is adjacent to the gallbladder in right lobe of the liver. IMPRESSION: 1. No gallstones or biliary dilatation. 2. Heterogeneous coarsened hepatic echogenicity. Question of subtle capsular nodularity raises concern for cirrhosis. Trace perihepatic free fluid. 3. Peri hepatic rounded 4.1 cm lesion corresponds to known adrenal adenoma. 4. Suspected fluid-filled colon adjacent to the liver and gallbladder. Electronically Signed   By: Andrea Gasman M.D.   On: 05/25/2024 22:59    EKG: I independently viewed the EKG done and my findings are as followed: Sinus rhythm rate of 78.  Nonspecific ST-T changes.  QTc 488.  Assessment/Plan Present on Admission:  SBO (small bowel obstruction) (HCC)  Principal Problem:   SBO (small bowel obstruction) (HCC)  SBO, with a transition point in the anterior central abdomen History of partial hysterectomy 32 years ago. SBO protocol in place IV fluid hydration Dulcolax suppository 10 mg x 1. Ambulate as tolerated. Replete electrolytes Goal magnesium  greater than 2.0, goal potassium greater than 4.0. General surgery consulted by EDP.  AKI on CKD 3B, prerenal in setting of vomiting and poor oral intake. Baseline creatinine is 1.8 with GFR of 31 Presented with creatinine of 2.58 with GFR of 20 Avoid nephrotoxic agents, dehydration, and hypotension. Continue IV fluid hydration Monitor urine output Repeat BMP in the morning.  Elevated lipase level with concern for acute pancreatitis Continue IV fluid hydration Continue supportive care. Repeat lipase level in the morning.  Non-anion gap metabolic acidosis Serum bicarb 19, anion gap 15 Continue IV fluid hydration Repeat chemistry panel in the morning.  Hypertension, BP is not at goal, elevated IV hydralazine  as needed with parameters Continue to closely monitor vital signs  Anemia of chronic disease in the setting of CKD  3B Hemoglobin 11.5, at baseline Continue to monitor for now.  History of right ankle fracture status post 3 surgeries Currently undergoing physical therapy twice a week. PT OT evaluation Fall precautions.  Right adrenal mass measuring 4.4 x 3.2 cm with attenuation of 3 HU, compatible with a lipid rich adenoma.  Incidental finding on CT scan.   Time: 75 minutes.   DVT prophylaxis: Subcu heparin  3 times daily  Code Status: Full code.  Family Communication: None at bedside.  Disposition Plan: Admitted to MedSurg unit.  Consults called: General Surgery consulted by EDP.  Admission status: Inpatient status.   Status is: Inpatient The patient requires at least 2 midnights for further evaluation and treatment of present condition.   Terry  LOISE Hurst MD [facility] Pager [phone]  If 7PM-7AM, please contact night-coverage [email or web address] Password [email or web address]  05/26/2024, 1:11 AM

## 2024-05-26 NOTE — ED Provider Notes (Signed)
 Care assumed from previous shift.  Patient here with upper abdominal pain, nausea and vomiting onset this afternoon.  Emesis nonbloody and nonbilious.  Ultrasound shows no gallstones or biliary dilatation.  Does show concern for cirrhosis and adrenal adenoma. CT scan concerning for small bowel obstruction with transition point in anterior abdomen.  No free air.  Will continue IV fluids, place NG tube.  Control pain and nausea.  Lipase elevated 182. Normal LFTs. Creatinine at baseline.  Consult to general surgery.  Medical admission discussed with Dr. Shona.   Carita Senior, MD 05/26/24 [phone]

## 2024-05-26 NOTE — Plan of Care (Signed)
   Problem: Activity: Goal: Risk for activity intolerance will decrease Outcome: Progressing   Problem: Nutrition: Goal: Adequate nutrition will be maintained Outcome: Progressing   Problem: Pain Managment: Goal: General experience of comfort will improve and/or be controlled Outcome: Progressing   Problem: Safety: Goal: Ability to remain free from injury will improve Outcome: Progressing

## 2024-05-26 NOTE — Progress Notes (Signed)
 NT reports pt accidentally pulled out NGT. Page on call. Waiting on response. Pt tolerates clear liq diet well today. No problem noted.

## 2024-05-27 ENCOUNTER — Encounter (HOSPITAL_COMMUNITY): Payer: Self-pay | Admitting: Internal Medicine

## 2024-05-27 DIAGNOSIS — K56609 Unspecified intestinal obstruction, unspecified as to partial versus complete obstruction: Secondary | ICD-10-CM | POA: Diagnosis not present

## 2024-05-27 LAB — CBC
HCT: 35.4 % — ABNORMAL LOW (ref 36.0–46.0)
Hemoglobin: 10 g/dL — ABNORMAL LOW (ref 12.0–15.0)
MCH: 26.7 pg (ref 26.0–34.0)
MCHC: 28.2 g/dL — ABNORMAL LOW (ref 30.0–36.0)
MCV: 94.7 fL (ref 80.0–100.0)
Platelets: 287 K/uL (ref 150–400)
RBC: 3.74 MIL/uL — ABNORMAL LOW (ref 3.87–5.11)
RDW: 13.4 % (ref 11.5–15.5)
WBC: 7.7 K/uL (ref 4.0–10.5)
nRBC: 0 % (ref 0.0–0.2)

## 2024-05-27 LAB — COMPREHENSIVE METABOLIC PANEL WITH GFR
ALT: 29 U/L (ref 0–44)
AST: 32 U/L (ref 15–41)
Albumin: 3.8 g/dL (ref 3.5–5.0)
Alkaline Phosphatase: 94 U/L (ref 38–126)
Anion gap: 11 (ref 5–15)
BUN: 38 mg/dL — ABNORMAL HIGH (ref 8–23)
CO2: 22 mmol/L (ref 22–32)
Calcium: 8.9 mg/dL (ref 8.9–10.3)
Chloride: 108 mmol/L (ref 98–111)
Creatinine, Ser: 2.17 mg/dL — ABNORMAL HIGH (ref 0.44–1.00)
GFR, Estimated: 25 mL/min — ABNORMAL LOW (ref >60)
Glucose, Bld: 86 mg/dL (ref 70–99)
Potassium: 4.4 mmol/L (ref 3.5–5.1)
Sodium: 141 mmol/L (ref 135–145)
Total Bilirubin: 0.7 mg/dL (ref 0.0–1.2)
Total Protein: 6.8 g/dL (ref 6.5–8.1)

## 2024-05-27 MED ORDER — HYDRALAZINE HCL 50 MG PO TABS
50.0000 mg | ORAL_TABLET | Freq: Three times a day (TID) | ORAL | Status: DC
  Administered 2024-05-27 – 2024-05-28 (×3): 50 mg via ORAL
  Filled 2024-05-27 (×3): qty 1

## 2024-05-27 MED ORDER — PANTOPRAZOLE SODIUM 40 MG IV SOLR
40.0000 mg | Freq: Two times a day (BID) | INTRAVENOUS | Status: DC
  Administered 2024-05-27 – 2024-05-28 (×3): 40 mg via INTRAVENOUS
  Filled 2024-05-27 (×3): qty 10

## 2024-05-27 MED ORDER — ASPIRIN 81 MG PO TBEC
81.0000 mg | DELAYED_RELEASE_TABLET | Freq: Every day | ORAL | Status: DC
  Administered 2024-05-27 – 2024-05-28 (×2): 81 mg via ORAL
  Filled 2024-05-27 (×2): qty 1

## 2024-05-27 MED ORDER — ATORVASTATIN CALCIUM 40 MG PO TABS
80.0000 mg | ORAL_TABLET | Freq: Every day | ORAL | Status: DC
  Administered 2024-05-27 – 2024-05-28 (×2): 80 mg via ORAL
  Filled 2024-05-27 (×2): qty 2

## 2024-05-27 MED ORDER — CARVEDILOL 12.5 MG PO TABS
12.5000 mg | ORAL_TABLET | Freq: Two times a day (BID) | ORAL | Status: DC
  Administered 2024-05-27 – 2024-05-28 (×2): 12.5 mg via ORAL
  Filled 2024-05-27 (×2): qty 1

## 2024-05-27 MED ORDER — AMLODIPINE BESYLATE 10 MG PO TABS
10.0000 mg | ORAL_TABLET | Freq: Every day | ORAL | Status: DC
  Administered 2024-05-27 – 2024-05-28 (×2): 10 mg via ORAL
  Filled 2024-05-27 (×2): qty 1

## 2024-05-27 MED ORDER — ACETAMINOPHEN 325 MG PO TABS
650.0000 mg | ORAL_TABLET | Freq: Four times a day (QID) | ORAL | Status: DC | PRN
  Administered 2024-05-27 – 2024-05-28 (×3): 650 mg via ORAL
  Filled 2024-05-27 (×3): qty 2

## 2024-05-27 MED ORDER — DOCUSATE SODIUM 100 MG PO CAPS
100.0000 mg | ORAL_CAPSULE | Freq: Two times a day (BID) | ORAL | Status: DC
  Administered 2024-05-27 – 2024-05-28 (×3): 100 mg via ORAL
  Filled 2024-05-27 (×3): qty 1

## 2024-05-27 MED ORDER — HYDROMORPHONE HCL 1 MG/ML IJ SOLN: 0.5000 mg | INTRAMUSCULAR | Status: DC | PRN

## 2024-05-27 MED ORDER — POLYETHYLENE GLYCOL 3350 17 G PO PACK
17.0000 g | PACK | Freq: Every day | ORAL | Status: DC
  Administered 2024-05-27 – 2024-05-28 (×2): 17 g via ORAL
  Filled 2024-05-27 (×2): qty 1

## 2024-05-27 NOTE — Plan of Care (Signed)
  Problem: Education: Goal: Knowledge of General Education information will improve Description: Including pain rating scale, medication(s)/side effects and non-pharmacologic comfort measures Outcome: Progressing   Problem: Health Behavior/Discharge Planning: Goal: Ability to manage health-related needs will improve Outcome: Progressing   Problem: Clinical Measurements: Goal: Ability to maintain clinical measurements within normal limits will improve Outcome: Progressing Goal: Will remain free from infection Outcome: Progressing Goal: Diagnostic test results will improve Outcome: Progressing Goal: Respiratory complications will improve Outcome: Progressing Goal: Cardiovascular complication will be avoided Outcome: Progressing   Problem: Activity: Goal: Risk for activity intolerance will decrease Outcome: Progressing   Problem: Nutrition: Goal: Adequate nutrition will be maintained Outcome: Progressing   Problem: Coping: Goal: Level of anxiety will decrease Outcome: Progressing   Problem: Elimination: Goal: Will not experience complications related to bowel motility Outcome: Completed/Met Goal: Will not experience complications related to urinary retention Outcome: Completed/Met   Problem: Pain Managment: Goal: General experience of comfort will improve and/or be controlled Outcome: Progressing   Problem: Safety: Goal: Ability to remain free from injury will improve Outcome: Progressing   Problem: Skin Integrity: Goal: Risk for impaired skin integrity will decrease Outcome: Progressing

## 2024-05-27 NOTE — Progress Notes (Signed)
 PROGRESS NOTE    [name]  FMW:[id number] DOB: 10/28/1960 DOA: 05/25/2024 PCP: Georgina Speaks, FNP   Brief Narrative: 63 yo PMH heart failure preserved ejection fraction on Lasix , hypertension, hyperlipidemia, CKD 3B, GERD, history of right ankle fracture status post 3 surgery, presents complaining of abdominal pain associated with nausea and vomiting.  Evaluation in the ED CT abdomen and pelvis without contrast revealed dilated fluid-filled small bowel with transition point in the anterior central abdomen concerning for small bowel obstruction.   Patient admitted for SBO.  Surgery consulted.   Assessment & Plan:   Principal Problem:   SBO (small bowel obstruction) (HCC)   1-SBO: Presents with abdominal pain nausea vomiting.  CT abdomen showed dilated small bowel with transition point anterior central abdomen. NG tube - Surgery consulted, started small bowel protocol - NG tube came off, she has been tolerating clear diet.  -Surgery have advanced diet to soft today.  -Improving.   2-AKI on CKD 3B In the setting of poor oral intake and vomiting Creatinine baseline 1.8 Presented with a creatinine of 2.5--- down to 2.1 Continue IV fluids Holding lasix  and spironolactone .   3-Hypertension, urgency: Resume oral medications today  Change IV hydralazine  and metoprolol to oral.  Resume Norvasc .  PRN hydralazine .   Elevated lipase: Could be in the setting of AKI.  Monitor  Non anion gap metabolic acidosis: In the setting of vomiting. Continue IV fluids  Anemia of chronic disease: Monitor hemoglobin  History of right ankle fracture status post 3 surgery PT OT consulted Tylenol  PRN for pain.   Right adrenal mass measuring 4 x 3 cm.  Incidental finding on CT will need outpatient follow-up   Estimated body mass index is 35.3 kg/m as calculated from the following:   Height as of this encounter: 5' 8 (1.727 m).   Weight as of this encounter: 105.3 kg.   DVT  prophylaxis: Heparin  Code Status: Full code Family Communication: Care discussed with patient Disposition Plan:  Status is: Inpatient Remains inpatient appropriate because: management of SBO    Consultants:  Surgery   Procedures:  none  Antimicrobials:    Subjective: NG tube came off last night.  She has been able to tolerates liquids.  She had 2 BM last night. Passing gas today.   Objective: Vitals:   05/26/24 2040 05/27/24 0530 05/27/24 0729 05/27/24 1033  BP:  (!) 185/82 (!) 183/78 (!) 187/84  Pulse:  80 79   Resp:  14 18   Temp:  98.4 F (36.9 C) 98.4 F (36.9 C)   TempSrc:  Oral Oral   SpO2:  98% 91%   Weight: 105.3 kg     Height: 5' 8 (1.727 m)       Intake/Output Summary (Last 24 hours) at 05/27/2024 1340 Last data filed at 05/27/2024 0200 Gross per 24 hour  Intake 588.06 ml  Output --  Net 588.06 ml   Filed Weights   05/26/24 2040  Weight: 105.3 kg    Examination:  General exam: NAD Respiratory system: CTA Cardiovascular system: S 1, S 2 RRR Gastrointestinal system: BS present, soft, nt.  Central nervous system: Alert, oriented.  Extremities: No edema.    Data Reviewed: I have personally reviewed following labs and imaging studies  CBC: Recent Labs  Lab 05/25/24 2216 05/26/24 0701 05/27/24 0410  WBC 10.7* 10.2 7.7  HGB 11.5* 11.2* 10.0*  HCT 39.8 38.3 35.4*  MCV 93.0 94.8 94.7  PLT 279 294 287   Basic Metabolic Panel:  Recent Labs  Lab 05/25/24 2216 05/26/24 0701 05/27/24 0410  NA 137 140 141  K 4.8 4.6 4.4  CL 104 107 108  CO2 19* 21* 22  GLUCOSE 122* 103* 86  BUN 45* 42* 38*  CREATININE 2.58* 2.27* 2.17*  CALCIUM  9.5 9.1 8.9  MG  --  2.3  --   PHOS  --  4.3  --    GFR: Estimated Creatinine Clearance: 33.7 mL/min (A) (by C-G formula based on SCr of 2.17 mg/dL (H)). Liver Function Tests: Recent Labs  Lab 05/25/24 2216 05/27/24 0410  AST 38 32  ALT 40 29  ALKPHOS 121 94  BILITOT 0.6 0.7  PROT 8.2* 6.8  ALBUMIN  4.4 3.8   Recent Labs  Lab 05/25/24 2216 05/26/24 0701  LIPASE 182* 167*   No results for input(s): AMMONIA in the last 168 hours. Coagulation Profile: No results for input(s): INR, PROTIME in the last 168 hours. Cardiac Enzymes: No results for input(s): CKTOTAL, CKMB, CKMBINDEX, TROPONINI in the last 168 hours. BNP (last 3 results) No results for input(s): PROBNP in the last 8760 hours. HbA1C: No results for input(s): HGBA1C in the last 72 hours. CBG: No results for input(s): GLUCAP in the last 168 hours. Lipid Profile: No results for input(s): CHOL, HDL, LDLCALC, TRIG, CHOLHDL, LDLDIRECT in the last 72 hours. Thyroid Function Tests: No results for input(s): TSH, T4TOTAL, FREET4, T3FREE, THYROIDAB in the last 72 hours. Anemia Panel: No results for input(s): VITAMINB12, FOLATE, FERRITIN, TIBC, IRON , RETICCTPCT in the last 72 hours. Sepsis Labs: No results for input(s): PROCALCITON, LATICACIDVEN in the last 168 hours.  No results found for this or any previous visit (from the past 240 hours).       Radiology Studies: DG Abd Portable 1V-Small Bowel Obstruction Protocol-initial, 8 hr delay Result Date: 05/26/2024 CLINICAL DATA:  Possible small bowel obstruction on CT abdomen pelvis 05/25/2024. 8 hour delayed post contrast injection. EXAM: PORTABLE ABDOMEN - 1 VIEW COMPARISON:  05/26/2024 and CT abdomen pelvis 05/25/2024. FINDINGS: Oral contrast is seen in the distal small bowel and throughout the colon. There is persistent gaseous distention of small bowel. Nasogastric tube terminates in the stomach. IMPRESSION: Oral contrast is seen in the distal small bowel and throughout the colon, with small bowel dilatation, findings indicative a mild residual partial small bowel obstruction. Electronically Signed   By: Newell Eke M.D.   On: 05/26/2024 14:08   DG Abd Portable 1 View Result Date: 05/26/2024 EXAM: 1 VIEW XRAY OF THE  ABDOMEN 05/26/2024 03:16:00 AM CLINICAL HISTORY: NG placement. NG placement FINDINGS: BOWEL: Nonobstructive bowel gas pattern. SOFT TISSUES: No opaque urinary calculi. Enteric tube in place with tip overlying the gastric lumen and proximal side port in the expected location of the gastroesophageal junction. BONES: No acute osseous abnormality. IMPRESSION: 1. Enteric tube in place with tip overlying the gastric lumen and proximal side port in the expected location of the gastroesophageal junction. The catheter could be advanced 5-10 cm for more optimal positioning. 2. Enlarged cardiac silhouette. Electronically signed by: Dorethia Molt MD 05/26/2024 03:26 AM EDT RP Workstation: [id number]   CT ABDOMEN PELVIS WO CONTRAST Result Date: 05/26/2024 EXAM: CT ABDOMEN AND PELVIS WITHOUT CONTRAST 05/25/2024 11:45:04 PM TECHNIQUE: CT of the abdomen and pelvis was performed without the administration of intravenous contrast. Multiplanar reformatted images are provided for review. Automated exposure control, iterative reconstruction, and/or weight-based adjustment of the mA/kV was utilized to reduce the radiation dose to as low as reasonably achievable. COMPARISON: 03/14/2024  CLINICAL HISTORY: Abdominal pain, acute, nonlocalized. FINDINGS: LOWER CHEST: No acute abnormality. LIVER: The liver is unremarkable. GALLBLADDER AND BILE DUCTS: Gallbladder is unremarkable. No biliary ductal dilatation. SPLEEN: No acute abnormality. PANCREAS: No acute abnormality. ADRENAL GLANDS: Right adrenal mass measures 4.4 x 3.2 cm with an attenuation value of 3 HU. Normal left adrenal gland. KIDNEYS, URETERS AND BLADDER: No stones in the kidneys or ureters. No hydronephrosis. No perinephric or periureteral stranding. Urinary bladder is unremarkable. GI AND BOWEL: Stomach demonstrates no acute abnormality. There is dilated fluid-filled small bowel within the right abdomen and the lower midline abdomen. A transition point appears to be within the  anterior central abdomen (series 2, image 59). Normal appendix. PERITONEUM AND RETROPERITONEUM: No ascites. No free intraperitoneal air. VASCULATURE: Aorta is normal in caliber. Calcific aortic atherosclerosis. LYMPH NODES: No lymphadenopathy. REPRODUCTIVE ORGANS: No acute abnormality. BONES AND SOFT TISSUES: No acute osseous abnormality. No focal soft tissue abnormality. IMPRESSION: 1. Dilated fluid-filled small bowel with a transition point in the anterior central abdomen, concerning for small bowel obstruction. No free intraperitoneal air. 2. Right adrenal mass measuring 4.4 x 3.2 cm with attenuation of 3 HU, compatible with a lipid-rich adenoma. 3. Calcific aortic atherosclerosis. Electronically signed by: Franky Stanford MD 05/26/2024 12:16 AM EDT RP Workstation: [id number]   US  Abdomen Limited Result Date: 05/25/2024 CLINICAL DATA:  Right upper quadrant pain. EXAM: ULTRASOUND ABDOMEN LIMITED RIGHT UPPER QUADRANT COMPARISON:  CT 03/14/2024, ultrasound 02/21/2023 FINDINGS: Gallbladder: No gallstones or wall thickening visualized. No sonographic Murphy sign noted by sonographer. Common bile duct: Diameter: 3 mm. Liver: Heterogeneous and coarsened parenchymal echogenicity. Question of subtle capsular nodularity. Portal vein is patent on color Doppler imaging with normal direction of blood flow towards the liver. Other: Right upper quadrant free fluid. 4.1 cm extrahepatic structures likely known right adrenal adenoma. This was seen on prior CT, and unchanged in size. Fluid-filled bowel is adjacent to the gallbladder in right lobe of the liver. IMPRESSION: 1. No gallstones or biliary dilatation. 2. Heterogeneous coarsened hepatic echogenicity. Question of subtle capsular nodularity raises concern for cirrhosis. Trace perihepatic free fluid. 3. Peri hepatic rounded 4.1 cm lesion corresponds to known adrenal adenoma. 4. Suspected fluid-filled colon adjacent to the liver and gallbladder. Electronically Signed   By:  Andrea Gasman M.D.   On: 05/25/2024 22:59        Scheduled Meds:  bisacodyl   10 mg Rectal Once   docusate sodium   100 mg Oral BID   heparin   5,000 Units Subcutaneous Q8H   hydrALAZINE   20 mg Intravenous TID   metoprolol tartrate  5 mg Intravenous TID   pantoprazole  (PROTONIX ) IV  40 mg Intravenous Q12H   polyethylene glycol  17 g Oral Daily   Continuous Infusions:  lactated ringers  75 mL/hr at 05/27/24 0410     LOS: 1 day    Time spent: 35 Minutes    Zhi Geier A Cierra Rothgeb, MD [facility]   If 7PM-7AM, please contact night-coverage [email or web address]  05/27/2024, 1:40 PM

## 2024-05-27 NOTE — Progress Notes (Signed)
 Progress Note     Subjective: Pt tolerating CLD and NGT came out last night. Passing flatus and having BMs. Denies nausea or vomiting. Denies abdominal pain.   Objective: Vital signs in last 24 hours: Temp:  [98.3 F (36.8 C)-98.5 F (36.9 C)] 98.4 F (36.9 C) (10/07 0729) Pulse Rate:  [72-88] 79 (10/07 0729) Resp:  [14-18] 18 (10/07 0729) BP: (147-187)/(77-88) 187/84 (10/07 1033) SpO2:  [91 %-98 %] 91 % (10/07 0729) Weight:  [105.3 kg] 105.3 kg (10/06 2040) Last BM Date : 05/26/24  Intake/Output from previous day: 10/06 0701 - 10/07 0700 In: 588.1 [I.V.:588.1] Out: -  Intake/Output this shift: No intake/output data recorded.  PE: General: pleasant, WD, obese female who is laying in bed in NAD Heart: regular, rate, and rhythm.  Lungs: Respiratory effort nonlabored Abd: soft, NT, mild distention Psych: A&Ox3 with an appropriate affect.   Lab Results:  Recent Labs    05/26/24 0701 05/27/24 0410  WBC 10.2 7.7  HGB 11.2* 10.0*  HCT 38.3 35.4*  PLT 294 287   BMET Recent Labs    05/26/24 0701 05/27/24 0410  NA 140 141  K 4.6 4.4  CL 107 108  CO2 21* 22  GLUCOSE 103* 86  BUN 42* 38*  CREATININE 2.27* 2.17*  CALCIUM  9.1 8.9   PT/INR No results for input(s): LABPROT, INR in the last 72 hours. CMP     Component Value Date/Time   NA 141 05/27/2024 0410   NA 141 09/06/2023 1319   K 4.4 05/27/2024 0410   CL 108 05/27/2024 0410   CO2 22 05/27/2024 0410   GLUCOSE 86 05/27/2024 0410   BUN 38 (H) 05/27/2024 0410   BUN 46 (H) 09/06/2023 1319   CREATININE 2.17 (H) 05/27/2024 0410   CREATININE 2.45 (H) 03/09/2023 1448   CREATININE 0.97 11/17/2013 0924   CALCIUM  8.9 05/27/2024 0410   PROT 6.8 05/27/2024 0410   PROT 7.8 09/06/2023 1319   ALBUMIN 3.8 05/27/2024 0410   ALBUMIN 4.6 09/06/2023 1319   AST 32 05/27/2024 0410   AST 11 (L) 03/09/2023 1448   ALT 29 05/27/2024 0410   ALT 15 03/09/2023 1448   ALKPHOS 94 05/27/2024 0410   BILITOT 0.7  05/27/2024 0410   BILITOT 0.8 09/06/2023 1319   BILITOT 0.3 03/09/2023 1448   GFRNONAA 25 (L) 05/27/2024 0410   GFRNONAA 22 (L) 03/09/2023 1448   GFRNONAA 67 11/17/2013 0924   GFRAA 48 (L) 10/11/2020 1519   GFRAA 77 11/17/2013 0924   Lipase     Component Value Date/Time   LIPASE 167 (H) 05/26/2024 0701       Studies/Results: DG Abd Portable 1V-Small Bowel Obstruction Protocol-initial, 8 hr delay Result Date: 05/26/2024 CLINICAL DATA:  Possible small bowel obstruction on CT abdomen pelvis 05/25/2024. 8 hour delayed post contrast injection. EXAM: PORTABLE ABDOMEN - 1 VIEW COMPARISON:  05/26/2024 and CT abdomen pelvis 05/25/2024. FINDINGS: Oral contrast is seen in the distal small bowel and throughout the colon. There is persistent gaseous distention of small bowel. Nasogastric tube terminates in the stomach. IMPRESSION: Oral contrast is seen in the distal small bowel and throughout the colon, with small bowel dilatation, findings indicative a mild residual partial small bowel obstruction. Electronically Signed   By: Newell Eke M.D.   On: 05/26/2024 14:08   DG Abd Portable 1 View Result Date: 05/26/2024 EXAM: 1 VIEW XRAY OF THE ABDOMEN 05/26/2024 03:16:00 AM CLINICAL HISTORY: NG placement. NG placement FINDINGS: BOWEL: Nonobstructive bowel gas pattern.  SOFT TISSUES: No opaque urinary calculi. Enteric tube in place with tip overlying the gastric lumen and proximal side port in the expected location of the gastroesophageal junction. BONES: No acute osseous abnormality. IMPRESSION: 1. Enteric tube in place with tip overlying the gastric lumen and proximal side port in the expected location of the gastroesophageal junction. The catheter could be advanced 5-10 cm for more optimal positioning. 2. Enlarged cardiac silhouette. Electronically signed by: Dorethia Molt MD 05/26/2024 03:26 AM EDT RP Workstation: [id number]   CT ABDOMEN PELVIS WO CONTRAST Result Date: 05/26/2024 EXAM: CT ABDOMEN AND  PELVIS WITHOUT CONTRAST 05/25/2024 11:45:04 PM TECHNIQUE: CT of the abdomen and pelvis was performed without the administration of intravenous contrast. Multiplanar reformatted images are provided for review. Automated exposure control, iterative reconstruction, and/or weight-based adjustment of the mA/kV was utilized to reduce the radiation dose to as low as reasonably achievable. COMPARISON: 03/14/2024 CLINICAL HISTORY: Abdominal pain, acute, nonlocalized. FINDINGS: LOWER CHEST: No acute abnormality. LIVER: The liver is unremarkable. GALLBLADDER AND BILE DUCTS: Gallbladder is unremarkable. No biliary ductal dilatation. SPLEEN: No acute abnormality. PANCREAS: No acute abnormality. ADRENAL GLANDS: Right adrenal mass measures 4.4 x 3.2 cm with an attenuation value of 3 HU. Normal left adrenal gland. KIDNEYS, URETERS AND BLADDER: No stones in the kidneys or ureters. No hydronephrosis. No perinephric or periureteral stranding. Urinary bladder is unremarkable. GI AND BOWEL: Stomach demonstrates no acute abnormality. There is dilated fluid-filled small bowel within the right abdomen and the lower midline abdomen. A transition point appears to be within the anterior central abdomen (series 2, image 59). Normal appendix. PERITONEUM AND RETROPERITONEUM: No ascites. No free intraperitoneal air. VASCULATURE: Aorta is normal in caliber. Calcific aortic atherosclerosis. LYMPH NODES: No lymphadenopathy. REPRODUCTIVE ORGANS: No acute abnormality. BONES AND SOFT TISSUES: No acute osseous abnormality. No focal soft tissue abnormality. IMPRESSION: 1. Dilated fluid-filled small bowel with a transition point in the anterior central abdomen, concerning for small bowel obstruction. No free intraperitoneal air. 2. Right adrenal mass measuring 4.4 x 3.2 cm with attenuation of 3 HU, compatible with a lipid-rich adenoma. 3. Calcific aortic atherosclerosis. Electronically signed by: Franky Stanford MD 05/26/2024 12:16 AM EDT RP Workstation:  [id number]   US  Abdomen Limited Result Date: 05/25/2024 CLINICAL DATA:  Right upper quadrant pain. EXAM: ULTRASOUND ABDOMEN LIMITED RIGHT UPPER QUADRANT COMPARISON:  CT 03/14/2024, ultrasound 02/21/2023 FINDINGS: Gallbladder: No gallstones or wall thickening visualized. No sonographic Murphy sign noted by sonographer. Common bile duct: Diameter: 3 mm. Liver: Heterogeneous and coarsened parenchymal echogenicity. Question of subtle capsular nodularity. Portal vein is patent on color Doppler imaging with normal direction of blood flow towards the liver. Other: Right upper quadrant free fluid. 4.1 cm extrahepatic structures likely known right adrenal adenoma. This was seen on prior CT, and unchanged in size. Fluid-filled bowel is adjacent to the gallbladder in right lobe of the liver. IMPRESSION: 1. No gallstones or biliary dilatation. 2. Heterogeneous coarsened hepatic echogenicity. Question of subtle capsular nodularity raises concern for cirrhosis. Trace perihepatic free fluid. 3. Peri hepatic rounded 4.1 cm lesion corresponds to known adrenal adenoma. 4. Suspected fluid-filled colon adjacent to the liver and gallbladder. Electronically Signed   By: Andrea Gasman M.D.   On: 05/25/2024 22:59    Anti-infectives: Anti-infectives (From admission, onward)    None        Assessment/Plan SBO - no prior SBO, Hx of partial abdominal hysterectomy  - NGT placed and SBO protocol was ordered by admitting provider - 8h delay with contrast throughout colon -  NGT came out yesterday evening, pt tolerating CLD and having bowel function  - ok to advance to soft diet, will order colace miralax  daily since pt takes miralax  every other day at home  - no indication for emergent surgical intervention at this time but we will follow    FEN: soft diet, IVF per [facility] VTE: SQH ID: no current abx   - per [facility] -  HFpEF CKD stage IIIb Chronic bronchitis HTN HLD PVD GERD    LOS: 1 day   I reviewed hospitalist  notes, last 24 h vitals and pain scores, last 48 h intake and output, last 24 h labs and trends, and last 24 h imaging results.  This care required moderate level of medical decision making.    Burnard JONELLE Louder, [facility] 05/27/2024, 12:27 PM Please see Amion for pager number during day hours 7:00am-4:30pm

## 2024-05-27 NOTE — Progress Notes (Signed)
 Mobility Specialist - Progress Note   Pre-mobility: 198/86 mmHg (118 MAP) BP Post-mobility: 197/96 mmHg (123 MAP) BP    05/27/24 0905  Mobility  Activity Ambulated with assistance  Level of Assistance Contact guard assist, steadying assist  Assistive Device Front wheel walker  Distance Ambulated (ft) 90 ft  Range of Motion/Exercises Active  Activity Response Tolerated well  Mobility Referral Yes  Mobility visit 1 Mobility  Mobility Specialist Start Time (ACUTE ONLY) [id number]  Mobility Specialist Stop Time (ACUTE ONLY) 0904  Mobility Specialist Time Calculation (min) (ACUTE ONLY) 22 min   Pt was received in bed and agreed to mobility. Returned to recliner with all needs met. Call bell in reach.  [employer] - Mobility Specialist

## 2024-05-28 DIAGNOSIS — K56609 Unspecified intestinal obstruction, unspecified as to partial versus complete obstruction: Secondary | ICD-10-CM | POA: Diagnosis not present

## 2024-05-28 DIAGNOSIS — E872 Acidosis, unspecified: Secondary | ICD-10-CM | POA: Insufficient documentation

## 2024-05-28 LAB — BASIC METABOLIC PANEL WITH GFR
Anion gap: 10 (ref 5–15)
BUN: 32 mg/dL — ABNORMAL HIGH (ref 8–23)
CO2: 22 mmol/L (ref 22–32)
Calcium: 8.6 mg/dL — ABNORMAL LOW (ref 8.9–10.3)
Chloride: 107 mmol/L (ref 98–111)
Creatinine, Ser: 1.96 mg/dL — ABNORMAL HIGH (ref 0.44–1.00)
GFR, Estimated: 28 mL/min — ABNORMAL LOW (ref >60)
Glucose, Bld: 95 mg/dL (ref 70–99)
Potassium: 4.3 mmol/L (ref 3.5–5.1)
Sodium: 139 mmol/L (ref 135–145)

## 2024-05-28 MED ORDER — FUROSEMIDE 40 MG PO TABS
40.0000 mg | ORAL_TABLET | Freq: Every day | ORAL | Status: DC
  Administered 2024-05-28: 40 mg via ORAL
  Filled 2024-05-28: qty 1

## 2024-05-28 MED ORDER — SPIRONOLACTONE 25 MG PO TABS
25.0000 mg | ORAL_TABLET | Freq: Every day | ORAL | Status: DC
  Administered 2024-05-28: 25 mg via ORAL
  Filled 2024-05-28: qty 1

## 2024-05-28 NOTE — Progress Notes (Signed)
 [facility] PROGRESS NOTE    Progress Note  [name]  FMW:[id number] DOB: 07/24/1961 DOA: 05/25/2024 PCP: Georgina Speaks, FNP     Brief Narrative:   [name] is an 63 y.o. female HFpEF, essential hypertension, chronic kidney disease stage IIIb history of ankle fracture status post surgery comes in complaining of abdominal pain nausea vomiting CT scan of the abdomen pelvis without contrast showed possible small bowel obstruction with a transition point in the anterior central abdomen.  Surgery was consulted NG tube was placed and is being treated conservatively.   Assessment/Plan:   SBO (small bowel obstruction) (HCC) NG tube was placed, started on the small bowel protocol. NG tube came off will start on a clear liquid diet which she has been tolerated. Advance diet as tolerated per surgery.  Acute kidney injury on chronic kidney disease stage IIIb: Baseline creatinine around 1.8. On admission 2.1 started on IV fluids diuretics were held. This morning of 1.9.  Primary hypertension Started back on his home regimen.  Norvasc , Coreg  and hydralazine . Discontinue lactate will start him on his home dose of Lasix  and Aldactone .  Elevated lipase: Noted.  Normal anion gap metabolic acidosis: Likely due to decreased oral intake/starvation ketosis. Now resolved.  Anemia of chronic renal disease: Hemoglobin stable.  History of right ankle fracture status post surgery: Continue Tylenol  for pain. PT evaluated the patient, will need home health PT.  Diastolic dysfunction without heart failure Continue Lasix  Aldactone  and beta-blockers.   DVT prophylaxis: lovenox  Family Communication:noen Status is: Inpatient Remains inpatient appropriate because: Small bowel obstruction    Code Status:     Code Status Orders  (From admission, onward)           Start     Ordered   05/26/24 0109  Full code  Continuous       Question:  By:  Answer:  Consent:  discussion documented in EHR   05/26/24 0108           Code Status History     Date Active Date Inactive Code Status Order ID Comments User Context   07/25/2023 2325 07/26/2023 1721 Full Code [id number]  Tobie Jorie SAUNDERS, MD ED   05/15/2023 1939 05/20/2023 2328 Full Code [id number]  Marcene Eva NOVAK, DO ED   04/18/2023 1624 04/19/2023 1939 Full Code [id number]  Edna Toribio LABOR, MD Inpatient   02/20/2023 1330 02/23/2023 1947 Full Code [id number]  Laurita Cort DASEN, MD ED   12/04/2020 1341 12/05/2020 1556 Full Code [id number]  Brien Therisa MATSU, MD ED   09/05/2018 1928 09/07/2018 1938 Full Code [id number]  Alfornia Madison, MD ED   06/11/2013 0835 06/13/2013 1846 Full Code [id number]  Hilma Rankins, MD Inpatient   05/12/2013 1636 05/13/2013 1743 Full Code [id number]  Arlena Almarie LABOR, MD Inpatient         IV Access:   Peripheral IV   Procedures and diagnostic studies:   DG Abd Portable 1V-Small Bowel Obstruction Protocol-initial, 8 hr delay Result Date: 05/26/2024 CLINICAL DATA:  Possible small bowel obstruction on CT abdomen pelvis 05/25/2024. 8 hour delayed post contrast injection. EXAM: PORTABLE ABDOMEN - 1 VIEW COMPARISON:  05/26/2024 and CT abdomen pelvis 05/25/2024. FINDINGS: Oral contrast is seen in the distal small bowel and throughout the colon. There is persistent gaseous distention of small bowel. Nasogastric tube terminates in the stomach. IMPRESSION: Oral contrast is seen in the distal small bowel and throughout the colon, with small bowel dilatation, findings indicative a mild residual partial small bowel obstruction.  Electronically Signed   By: Newell Eke M.D.   On: 05/26/2024 14:08     Medical Consultants:   None.   Subjective:    [name] relates she is hungry has had plenty of bowel movements.  Objective:    Vitals:   05/28/24 0105 05/28/24 0110 05/28/24 0226 05/28/24 0711  BP: (!) 173/72 (!) 173/72 (!) 191/84 (!) 188/87  Pulse: 75  72 75  Resp: 18  18 18    Temp: 98.9 F (37.2 C)  99.1 F (37.3 C) 98.5 F (36.9 C)  TempSrc: Oral  Oral Oral  SpO2: 98%  96% 99%  Weight:      Height:       SpO2: 99 %   Intake/Output Summary (Last 24 hours) at 05/28/2024 0716 Last data filed at 05/27/2024 1600 Gross per 24 hour  Intake 893.22 ml  Output --  Net 893.22 ml   Filed Weights   05/26/24 2040  Weight: 105.3 kg    Exam: General exam: In no acute distress. Respiratory system: Good air movement and clear to auscultation. Cardiovascular system: S1 & S2 heard, RRR. No JVD. Gastrointestinal system: Abdomen is nondistended, soft and nontender.  Extremities: No pedal edema. Skin: No rashes, lesions or ulcers Psychiatry: Judgement and insight appear normal. Mood & affect appropriate.    Data Reviewed:    Labs: Basic Metabolic Panel: Recent Labs  Lab 05/25/24 2216 05/26/24 0701 05/27/24 0410 05/28/24 0409  NA 137 140 141 139  K 4.8 4.6 4.4 4.3  CL 104 107 108 107  CO2 19* 21* 22 22  GLUCOSE 122* 103* 86 95  BUN 45* 42* 38* 32*  CREATININE 2.58* 2.27* 2.17* 1.96*  CALCIUM  9.5 9.1 8.9 8.6*  MG  --  2.3  --   --   PHOS  --  4.3  --   --    GFR Estimated Creatinine Clearance: 37.3 mL/min (A) (by C-G formula based on SCr of 1.96 mg/dL (H)). Liver Function Tests: Recent Labs  Lab 05/25/24 2216 05/27/24 0410  AST 38 32  ALT 40 29  ALKPHOS 121 94  BILITOT 0.6 0.7  PROT 8.2* 6.8  ALBUMIN 4.4 3.8   Recent Labs  Lab 05/25/24 2216 05/26/24 0701  LIPASE 182* 167*   No results for input(s): AMMONIA in the last 168 hours. Coagulation profile No results for input(s): INR, PROTIME in the last 168 hours. COVID-19 Labs  No results for input(s): DDIMER, FERRITIN, LDH, CRP in the last 72 hours.  Lab Results  Component Value Date   SARSCOV2NAA NEGATIVE 03/14/2024   SARSCOV2NAA NEGATIVE 12/04/2020    CBC: Recent Labs  Lab 05/25/24 2216 05/26/24 0701 05/27/24 0410  WBC 10.7* 10.2 7.7  HGB 11.5* 11.2*  10.0*  HCT 39.8 38.3 35.4*  MCV 93.0 94.8 94.7  PLT 279 294 287   Cardiac Enzymes: No results for input(s): CKTOTAL, CKMB, CKMBINDEX, TROPONINI in the last 168 hours. BNP (last 3 results) No results for input(s): PROBNP in the last 8760 hours. CBG: No results for input(s): GLUCAP in the last 168 hours. D-Dimer: No results for input(s): DDIMER in the last 72 hours. Hgb A1c: No results for input(s): HGBA1C in the last 72 hours. Lipid Profile: No results for input(s): CHOL, HDL, LDLCALC, TRIG, CHOLHDL, LDLDIRECT in the last 72 hours. Thyroid function studies: No results for input(s): TSH, T4TOTAL, T3FREE, THYROIDAB in the last 72 hours.  Invalid input(s): FREET3 Anemia work up: No results for input(s): VITAMINB12, FOLATE, FERRITIN, TIBC, IRON ,  RETICCTPCT in the last 72 hours. Sepsis Labs: Recent Labs  Lab 05/25/24 2216 05/26/24 0701 05/27/24 0410  WBC 10.7* 10.2 7.7   Microbiology No results found for this or any previous visit (from the past 240 hours).   Medications:    amLODipine   10 mg Oral Daily   aspirin  EC  81 mg Oral Daily   atorvastatin   80 mg Oral Daily   bisacodyl   10 mg Rectal Once   carvedilol   12.5 mg Oral BID WC   docusate sodium   100 mg Oral BID   heparin   5,000 Units Subcutaneous Q8H   hydrALAZINE   50 mg Oral TID   pantoprazole  (PROTONIX ) IV  40 mg Intravenous Q12H   polyethylene glycol  17 g Oral Daily   Continuous Infusions:  lactated ringers  75 mL/hr at 05/27/24 0410      LOS: 2 days   [name]  [facility]  05/28/2024, 7:16 AM

## 2024-05-28 NOTE — Progress Notes (Signed)
 Discharge instructions given to patient questions asked and answered.

## 2024-05-28 NOTE — Discharge Summary (Signed)
 Physician Discharge Summary  [name] FMW:[id number] DOB: [birth year] DOA: 05/25/2024  PCP: Georgina Speaks, FNP  Admit date: 05/25/2024 Discharge date: 05/28/2024  Admitted From: Home Disposition:  Home  Recommendations for Outpatient Follow-up:  Follow up with general surgery in 1-2 weeks Please obtain BMP/CBC in one week   Home Health:Yes Equipment/Devices:None  Discharge Condition:Stable CODE STATUS:Full Diet recommendation: Heart Healthy  Brief/Interim Summary: 63 y.o. female HFpEF, essential hypertension, chronic kidney disease stage IIIb history of ankle fracture status post surgery comes in complaining of abdominal pain nausea vomiting CT scan of the abdomen pelvis without contrast showed possible small bowel obstruction with a transition point in the anterior central abdomen.  Surgery was consulted NG tube was placed and is being treated conservatively.   Discharge Diagnoses:  Principal Problem:   SBO (small bowel obstruction) (HCC) Active Problems:   Primary hypertension   Diastolic dysfunction without heart failure   AKI (acute kidney injury)   CKD (chronic kidney disease) stage 3, GFR 30-59 ml/min (HCC)   Normal anion gap metabolic acidosis  Small bowel obstruction: She was treated conservatively with NG tube and IV fluids.  General surgery was consulted who agreed with surgical management. Small bowel resolved NG tube was discontinued. She was able to tolerate her diet having bowel movements.  Acute kidney injury on chronic kidney stage IIIb: With a baseline creatinine around 1.8. Likely prerenal azotemia started on IV fluids her creatinine returned to baseline.  Essential hypertension: No changes made to her medication resume home regimen as an outpatient.  Elevated lipase: Noted.  Normal anion gap metabolic acidosis: Resolved with IV fluids.  Anemia chronic renal disease: Hemoglobin stable.  History of right ankle fracture: PT evaluated patient  recommended PT OT.  Chronic diastolic dysfunction without heart failure: No changes made to her medication.  Discharge Instructions  Discharge Instructions     Diet - low sodium heart healthy   Complete by: As directed    Increase activity slowly   Complete by: As directed       Allergies as of 05/28/2024       Reactions   Oxycodone  Other (See Comments)   Severe constipation    Lisinopril  Cough        Medication List     STOP taking these medications    Fusion Plus Caps   pantoprazole  40 MG tablet Commonly known as: PROTONIX        TAKE these medications    acetaminophen  325 MG tablet Commonly known as: TYLENOL  Take 650 mg by mouth every 6 (six) hours as needed for mild pain or headache.   amLODipine  10 MG tablet Commonly known as: NORVASC  TAKE 1 TABLET(10 MG) BY MOUTH DAILY   aspirin  EC 81 MG tablet Take 81 mg by mouth daily. Swallow whole. What changed: Another medication with the same name was removed. Continue taking this medication, and follow the directions you see here.   atorvastatin  80 MG tablet Commonly known as: LIPITOR Take 1 tablet (80 mg total) by mouth daily.   carvedilol  12.5 MG tablet Commonly known as: COREG  Take 1 tablet (12.5 mg total) by mouth 2 (two) times daily with a meal.   cetirizine 10 MG tablet Commonly known as: ZYRTEC Take 10 mg by mouth daily as needed for allergies.   cyclobenzaprine  7.5 MG tablet Commonly known as: FEXMID  Take 7.5 mg by mouth 3 (three) times daily as needed.   Diclofenac Sodium 3 % Gel SMARTSIG:1-2 Gram(s) Topical 3-4 Times Daily   furosemide  40  MG tablet Commonly known as: LASIX  TAKE 1 TABLET(40 MG) BY MOUTH DAILY   hydrALAZINE  50 MG tablet Commonly known as: APRESOLINE  Take 1 tablet (50 mg total) by mouth 3 (three) times daily.   polyethylene glycol 17 g packet Commonly known as: MIRALAX  / GLYCOLAX  Take 17 g by mouth daily as needed for mild constipation or moderate constipation.    spironolactone  25 MG tablet Commonly known as: ALDACTONE  TAKE 1 TABLET BY MOUTH DAILY        Allergies  Allergen Reactions   Oxycodone  Other (See Comments)    Severe constipation    Lisinopril  Cough    Consultations: General Surgery   Procedures/Studies: DG Abd Portable 1V-Small Bowel Obstruction Protocol-initial, 8 hr delay Result Date: 05/26/2024 CLINICAL DATA:  Possible small bowel obstruction on CT abdomen pelvis 05/25/2024. 8 hour delayed post contrast injection. EXAM: PORTABLE ABDOMEN - 1 VIEW COMPARISON:  05/26/2024 and CT abdomen pelvis 05/25/2024. FINDINGS: Oral contrast is seen in the distal small bowel and throughout the colon. There is persistent gaseous distention of small bowel. Nasogastric tube terminates in the stomach. IMPRESSION: Oral contrast is seen in the distal small bowel and throughout the colon, with small bowel dilatation, findings indicative a mild residual partial small bowel obstruction. Electronically Signed   By: Newell Eke M.D.   On: 05/26/2024 14:08   DG Abd Portable 1 View Result Date: 05/26/2024 EXAM: 1 VIEW XRAY OF THE ABDOMEN 05/26/2024 03:16:00 AM CLINICAL HISTORY: NG placement. NG placement FINDINGS: BOWEL: Nonobstructive bowel gas pattern. SOFT TISSUES: No opaque urinary calculi. Enteric tube in place with tip overlying the gastric lumen and proximal side port in the expected location of the gastroesophageal junction. BONES: No acute osseous abnormality. IMPRESSION: 1. Enteric tube in place with tip overlying the gastric lumen and proximal side port in the expected location of the gastroesophageal junction. The catheter could be advanced 5-10 cm for more optimal positioning. 2. Enlarged cardiac silhouette. Electronically signed by: Dorethia Molt MD 05/26/2024 03:26 AM EDT RP Workstation: [id number]   CT ABDOMEN PELVIS WO CONTRAST Result Date: 05/26/2024 EXAM: CT ABDOMEN AND PELVIS WITHOUT CONTRAST 05/25/2024 11:45:04 PM TECHNIQUE: CT of the  abdomen and pelvis was performed without the administration of intravenous contrast. Multiplanar reformatted images are provided for review. Automated exposure control, iterative reconstruction, and/or weight-based adjustment of the mA/kV was utilized to reduce the radiation dose to as low as reasonably achievable. COMPARISON: 03/14/2024 CLINICAL HISTORY: Abdominal pain, acute, nonlocalized. FINDINGS: LOWER CHEST: No acute abnormality. LIVER: The liver is unremarkable. GALLBLADDER AND BILE DUCTS: Gallbladder is unremarkable. No biliary ductal dilatation. SPLEEN: No acute abnormality. PANCREAS: No acute abnormality. ADRENAL GLANDS: Right adrenal mass measures 4.4 x 3.2 cm with an attenuation value of 3 HU. Normal left adrenal gland. KIDNEYS, URETERS AND BLADDER: No stones in the kidneys or ureters. No hydronephrosis. No perinephric or periureteral stranding. Urinary bladder is unremarkable. GI AND BOWEL: Stomach demonstrates no acute abnormality. There is dilated fluid-filled small bowel within the right abdomen and the lower midline abdomen. A transition point appears to be within the anterior central abdomen (series 2, image 59). Normal appendix. PERITONEUM AND RETROPERITONEUM: No ascites. No free intraperitoneal air. VASCULATURE: Aorta is normal in caliber. Calcific aortic atherosclerosis. LYMPH NODES: No lymphadenopathy. REPRODUCTIVE ORGANS: No acute abnormality. BONES AND SOFT TISSUES: No acute osseous abnormality. No focal soft tissue abnormality. IMPRESSION: 1. Dilated fluid-filled small bowel with a transition point in the anterior central abdomen, concerning for small bowel obstruction. No free intraperitoneal air. 2. Right  adrenal mass measuring 4.4 x 3.2 cm with attenuation of 3 HU, compatible with a lipid-rich adenoma. 3. Calcific aortic atherosclerosis. Electronically signed by: Franky Stanford MD 05/26/2024 12:16 AM EDT RP Workstation: [id number]   US  Abdomen Limited Result Date: 05/25/2024 CLINICAL  DATA:  Right upper quadrant pain. EXAM: ULTRASOUND ABDOMEN LIMITED RIGHT UPPER QUADRANT COMPARISON:  CT 03/14/2024, ultrasound 02/21/2023 FINDINGS: Gallbladder: No gallstones or wall thickening visualized. No sonographic Murphy sign noted by sonographer. Common bile duct: Diameter: 3 mm. Liver: Heterogeneous and coarsened parenchymal echogenicity. Question of subtle capsular nodularity. Portal vein is patent on color Doppler imaging with normal direction of blood flow towards the liver. Other: Right upper quadrant free fluid. 4.1 cm extrahepatic structures likely known right adrenal adenoma. This was seen on prior CT, and unchanged in size. Fluid-filled bowel is adjacent to the gallbladder in right lobe of the liver. IMPRESSION: 1. No gallstones or biliary dilatation. 2. Heterogeneous coarsened hepatic echogenicity. Question of subtle capsular nodularity raises concern for cirrhosis. Trace perihepatic free fluid. 3. Peri hepatic rounded 4.1 cm lesion corresponds to known adrenal adenoma. 4. Suspected fluid-filled colon adjacent to the liver and gallbladder. Electronically Signed   By: Andrea Gasman M.D.   On: 05/25/2024 22:59     Subjective: No complaints she relates feels great.  Discharge Exam: Vitals:   05/28/24 0726 05/28/24 0807  BP: (!) 188/87 (!) 200/94  Pulse: 75   Resp:    Temp:    SpO2:     Vitals:   05/28/24 0226 05/28/24 0711 05/28/24 0726 05/28/24 0807  BP: (!) 191/84 (!) 188/87 (!) 188/87 (!) 200/94  Pulse: 72 75 75   Resp: 18 18    Temp: 99.1 F (37.3 C) 98.5 F (36.9 C)    TempSrc: Oral Oral    SpO2: 96% 99%    Weight:      Height:        General: Pt is alert, awake, not in acute distress Cardiovascular: RRR, S1/S2 +, no rubs, no gallops Respiratory: CTA bilaterally, no wheezing, no rhonchi Abdominal: Soft, NT, ND, bowel sounds + Extremities: no edema, no cyanosis    The results of significant diagnostics from this hospitalization (including imaging,  microbiology, ancillary and laboratory) are listed below for reference.     Microbiology: No results found for this or any previous visit (from the past 240 hours).   Labs: BNP (last 3 results) Recent Labs    07/25/23 1527 03/14/24 1101  BNP 240.9* 83.4   Basic Metabolic Panel: Recent Labs  Lab 05/25/24 2216 05/26/24 0701 05/27/24 0410 05/28/24 0409  NA 137 140 141 139  K 4.8 4.6 4.4 4.3  CL 104 107 108 107  CO2 19* 21* 22 22  GLUCOSE 122* 103* 86 95  BUN 45* 42* 38* 32*  CREATININE 2.58* 2.27* 2.17* 1.96*  CALCIUM  9.5 9.1 8.9 8.6*  MG  --  2.3  --   --   PHOS  --  4.3  --   --    Liver Function Tests: Recent Labs  Lab 05/25/24 2216 05/27/24 0410  AST 38 32  ALT 40 29  ALKPHOS 121 94  BILITOT 0.6 0.7  PROT 8.2* 6.8  ALBUMIN 4.4 3.8   Recent Labs  Lab 05/25/24 2216 05/26/24 0701  LIPASE 182* 167*   No results for input(s): AMMONIA in the last 168 hours. CBC: Recent Labs  Lab 05/25/24 2216 05/26/24 0701 05/27/24 0410  WBC 10.7* 10.2 7.7  HGB 11.5* 11.2* 10.0*  HCT 39.8 38.3 35.4*  MCV 93.0 94.8 94.7  PLT 279 294 287   Cardiac Enzymes: No results for input(s): CKTOTAL, CKMB, CKMBINDEX, TROPONINI in the last 168 hours. BNP: Invalid input(s): POCBNP CBG: No results for input(s): GLUCAP in the last 168 hours. D-Dimer No results for input(s): DDIMER in the last 72 hours. Hgb A1c No results for input(s): HGBA1C in the last 72 hours. Lipid Profile No results for input(s): CHOL, HDL, LDLCALC, TRIG, CHOLHDL, LDLDIRECT in the last 72 hours. Thyroid function studies No results for input(s): TSH, T4TOTAL, T3FREE, THYROIDAB in the last 72 hours.  Invalid input(s): FREET3 Anemia work up No results for input(s): VITAMINB12, FOLATE, FERRITIN, TIBC, IRON , RETICCTPCT in the last 72 hours. Urinalysis    Component Value Date/Time   COLORURINE YELLOW 05/26/2024 0504   APPEARANCEUR HAZY (A) 05/26/2024  0504   LABSPEC 1.013 05/26/2024 0504   PHURINE 5.0 05/26/2024 0504   GLUCOSEU NEGATIVE 05/26/2024 0504   HGBUR NEGATIVE 05/26/2024 0504   BILIRUBINUR NEGATIVE 05/26/2024 0504   BILIRUBINUR negative 11/16/2020 1725   KETONESUR NEGATIVE 05/26/2024 0504   PROTEINUR >=300 (A) 05/26/2024 0504   UROBILINOGEN 0.2 11/16/2020 1725   UROBILINOGEN 0.2 05/12/2013 1025   NITRITE NEGATIVE 05/26/2024 0504   LEUKOCYTESUR NEGATIVE 05/26/2024 0504   Sepsis Labs Recent Labs  Lab 05/25/24 2216 05/26/24 0701 05/27/24 0410  WBC 10.7* 10.2 7.7   Microbiology No results found for this or any previous visit (from the past 240 hours).   Time coordinating discharge: Over 35 minutes  SIGNED:   Erle Odell Castor, MD  [facility] 05/28/2024, 8:56 AM Pager   If 7PM-7AM, please contact night-coverage [email or web address] Password [email or web address]

## 2024-05-28 NOTE — Progress Notes (Signed)
 Progress Note     Subjective: Pt tolerating soft diet and continues to have bowel function. Denies nausea, vomiting or abdominal pain.   Objective: Vital signs in last 24 hours: Temp:  [98.5 F (36.9 C)-99.1 F (37.3 C)] 98.5 F (36.9 C) (10/08 0711) Pulse Rate:  [69-75] 75 (10/08 0726) Resp:  [18-20] 18 (10/08 0711) BP: (173-201)/(72-94) 200/94 (10/08 0807) SpO2:  [96 %-99 %] 99 % (10/08 0711) Last BM Date : 05/27/24  Intake/Output from previous day: 10/07 0701 - 10/08 0700 In: 893.2 [I.V.:893.2] Out: -  Intake/Output this shift: Total I/O In: 225 [I.V.:225] Out: -   PE: General: pleasant, WD, obese female who is laying in bed in NAD Heart: regular, rate, and rhythm.  Lungs: Respiratory effort nonlabored Abd: soft, NT, ND Psych: A&Ox3 with an appropriate affect.   Lab Results:  Recent Labs    05/26/24 0701 05/27/24 0410  WBC 10.2 7.7  HGB 11.2* 10.0*  HCT 38.3 35.4*  PLT 294 287   BMET Recent Labs    05/27/24 0410 05/28/24 0409  NA 141 139  K 4.4 4.3  CL 108 107  CO2 22 22  GLUCOSE 86 95  BUN 38* 32*  CREATININE 2.17* 1.96*  CALCIUM  8.9 8.6*   PT/INR No results for input(s): LABPROT, INR in the last 72 hours. CMP     Component Value Date/Time   NA 139 05/28/2024 0409   NA 141 09/06/2023 1319   K 4.3 05/28/2024 0409   CL 107 05/28/2024 0409   CO2 22 05/28/2024 0409   GLUCOSE 95 05/28/2024 0409   BUN 32 (H) 05/28/2024 0409   BUN 46 (H) 09/06/2023 1319   CREATININE 1.96 (H) 05/28/2024 0409   CREATININE 2.45 (H) 03/09/2023 1448   CREATININE 0.97 11/17/2013 0924   CALCIUM  8.6 (L) 05/28/2024 0409   PROT 6.8 05/27/2024 0410   PROT 7.8 09/06/2023 1319   ALBUMIN 3.8 05/27/2024 0410   ALBUMIN 4.6 09/06/2023 1319   AST 32 05/27/2024 0410   AST 11 (L) 03/09/2023 1448   ALT 29 05/27/2024 0410   ALT 15 03/09/2023 1448   ALKPHOS 94 05/27/2024 0410   BILITOT 0.7 05/27/2024 0410   BILITOT 0.8 09/06/2023 1319   BILITOT 0.3 03/09/2023 1448    GFRNONAA 28 (L) 05/28/2024 0409   GFRNONAA 22 (L) 03/09/2023 1448   GFRNONAA 67 11/17/2013 0924   GFRAA 48 (L) 10/11/2020 1519   GFRAA 77 11/17/2013 0924   Lipase     Component Value Date/Time   LIPASE 167 (H) 05/26/2024 0701       Studies/Results: DG Abd Portable 1V-Small Bowel Obstruction Protocol-initial, 8 hr delay Result Date: 05/26/2024 CLINICAL DATA:  Possible small bowel obstruction on CT abdomen pelvis 05/25/2024. 8 hour delayed post contrast injection. EXAM: PORTABLE ABDOMEN - 1 VIEW COMPARISON:  05/26/2024 and CT abdomen pelvis 05/25/2024. FINDINGS: Oral contrast is seen in the distal small bowel and throughout the colon. There is persistent gaseous distention of small bowel. Nasogastric tube terminates in the stomach. IMPRESSION: Oral contrast is seen in the distal small bowel and throughout the colon, with small bowel dilatation, findings indicative a mild residual partial small bowel obstruction. Electronically Signed   By: Newell Eke M.D.   On: 05/26/2024 14:08    Anti-infectives: Anti-infectives (From admission, onward)    None        Assessment/Plan SBO - no prior SBO, Hx of partial abdominal hysterectomy  - NGT placed and SBO protocol was ordered by admitting  provider - 8h delay with contrast throughout colon - tolerating soft diet and having bowel function  - stable for DC from surgery perspective   FEN: soft diet, IVF per [facility] VTE: SQH ID: no current abx   - per [facility] -  HFpEF CKD stage IIIb Chronic bronchitis HTN HLD PVD GERD    LOS: 2 days   I reviewed hospitalist notes, last 24 h vitals and pain scores, last 48 h intake and output, last 24 h labs and trends, and last 24 h imaging results.  This care required moderate level of medical decision making.    Burnard JONELLE Louder, [facility] Surgery 05/28/2024, 8:55 AM Please see Amion for pager number during day hours 7:00am-4:30pm

## 2024-05-29 ENCOUNTER — Telehealth: Payer: Self-pay | Admitting: *Deleted

## 2024-05-29 NOTE — Transitions of Care (Post Inpatient/ED Visit) (Signed)
 05/29/2024  Name: [name] MRN: [id number] DOB: [birth year]  Today's TOC FU Call Status: Today's TOC FU Call Status:: Successful TOC FU Call Completed TOC FU Call Complete Date: 05/29/24 Patient's Name and Date of Birth confirmed.  Transition Care Management Follow-up Telephone Call Date of Discharge: 05/28/24 Discharge Facility: [facility]) Type of Discharge: Inpatient Admission Primary Inpatient Discharge Diagnosis:: SBO How have you been since you were released from the hospital?: Better Any questions or concerns?: No  Items Reviewed: Did you receive and understand the discharge instructions provided?: Yes Medications obtained,verified, and reconciled?: Yes (Medications Reviewed) Any new allergies since your discharge?: No Dietary orders reviewed?: Yes Type of Diet Ordered:: low sodium heart healthy-discussed in detail Do you have support at home?: Yes People in Home [RPT]: alone Name of Support/Comfort Primary Source: Sister/Landy is supportive  Medications Reviewed Today: Medications Reviewed Today     Reviewed by Lucky Andrea LABOR, RN (Registered Nurse) on 05/29/24 at 1058  Med List Status: <None>   Medication Order Taking? Sig Documenting Provider Last Dose Status Informant  acetaminophen  (TYLENOL ) 325 MG tablet [id number] Yes Take 650 mg by mouth every 6 (six) hours as needed for mild pain or headache. [provider]  Active Self, Pharmacy Records  amLODipine  (NORVASC ) 10 MG tablet [id number] Yes TAKE 1 TABLET(10 MG) BY MOUTH DAILY Petrina Pries, NP  Active Self, Pharmacy Records  aspirin  EC 81 MG tablet [id number] Yes Take 81 mg by mouth daily. Swallow whole. [provider]  Active Self, Pharmacy Records  atorvastatin  (LIPITOR) 80 MG tablet [id number] Yes Take 1 tablet (80 mg total) by mouth daily. Raford Riggs, MD  Active Self, Pharmacy Records  carvedilol  (COREG ) 12.5 MG tablet [id number] Yes Take 1 tablet (12.5 mg total) by mouth 2 (two)  times daily with a meal. Raford Riggs, MD  Active Self, Pharmacy Records  cetirizine (ZYRTEC) 10 MG tablet [id number] Yes Take 10 mg by mouth daily as needed for allergies. [provider]  Active Self, Pharmacy Records  cyclobenzaprine  (FEXMID ) 7.5 MG tablet [id number] Yes Take 7.5 mg by mouth 3 (three) times daily as needed. [provider]  Active Self, Pharmacy Records  Diclofenac Sodium 3 % GEL [id number] Yes SMARTSIG:1-2 Gram(s) Topical 3-4 Times Daily [provider]  Active Self, Pharmacy Records  furosemide  (LASIX ) 40 MG tablet [id number]  TAKE 1 TABLET(40 MG) BY MOUTH DAILY  Patient not taking: Reported on 05/29/2024   Georgina Speaks, FNP  Active Self, Pharmacy Records  hydrALAZINE  (APRESOLINE ) 50 MG tablet [id number] Yes Take 1 tablet (50 mg total) by mouth 3 (three) times daily. Georgina Speaks, FNP  Active Self, Pharmacy Records  polyethylene glycol (MIRALAX  / GLYCOLAX ) 17 g packet [id number] Yes Take 17 g by mouth daily as needed for mild constipation or moderate constipation. Petrina Pries, NP  Active Self, Pharmacy Records  spironolactone  (ALDACTONE ) 25 MG tablet [id number] Yes TAKE 1 TABLET BY MOUTH DAILY Georgina Speaks, FNP  Active Self, Pharmacy Records            Home Care and Equipment/Supplies: Were Home Health Services Ordered?: No Any new equipment or medical supplies ordered?: No  Functional Questionnaire: Do you need assistance with bathing/showering or dressing?: No Do you need assistance with meal preparation?: No Do you need assistance with eating?: No Do you have difficulty maintaining continence: No Do you need assistance with getting out of bed/getting out of a chair/moving?: No Do you have difficulty managing or taking your medications?: No  Follow up appointments  reviewed: PCP Follow-up appointment confirmed?: No (RNCM assisted with scheduling on 06/02/24) MD Provider Line Number:[phone] Given: No Specialist Hospital Follow-up  appointment confirmed?: NA Do you need transportation to your follow-up appointment?: No Do you understand care options if your condition(s) worsen?: Yes-patient verbalized understanding  SDOH Interventions Today    Flowsheet Row Most Recent Value  SDOH Interventions   Food Insecurity Interventions Intervention Not Indicated  Housing Interventions Patient Declined  [Currently working with BSW, Carla]  Transportation Interventions Intervention Not Indicated  Utilities Interventions Intervention Not Indicated    Goals Addressed             This Visit's Progress    VBCI Transitions of Care (TOC) Care Plan       Problems:  Recent Hospitalization for treatment of Small bowel obstruction Knowledge Deficit Related to BP management  Goal:  Over the next 30 days, the patient will not experience hospital readmission  Interventions:  Transitions of Care: Doctor Visits  - discussed the importance of doctor visits Post discharge activity limitations prescribed by provider reviewed Reviewed Signs and symptoms of infection Medications reviewed, patient will request refills for Furosemide  and Amlodipine  today Discussed compliance packaging and delivery offered at [facility]-patient not interested Discussed patient will resume PT for right ankle fracture on 06/04/24 Discussed management of SBO, diet reviewed  Hypertension Interventions: Last practice recorded BP readings:  BP Readings from Last 3 Encounters:  05/29/24 132/66  05/28/24 (!) 157/74  05/12/24 124/68   Most recent eGFR/CrCl:  Lab Results  Component Value Date   EGFR 21.0 05/02/2024    No components found for: CRCL  Evaluation of current treatment plan related to hypertension self management and patient's adherence to plan as established by provider Reviewed medications with patient and discussed importance of compliance Counseled on the importance of exercise goals with target of 150 minutes per  week Assessed social determinant of health barriers Reviewed proper technique for checking BP, advised to check 2 hours after taking BP medication, record and take to provider appointments Diet reviewed BP readings reviewed Discussed the importance of requesting refills prior to running out  Patient Self Care Activities:  Attend all scheduled provider appointments Call pharmacy for medication refills 3-7 days in advance of running out of medications Call provider office for new concerns or questions  Notify RN Care Manager of TOC call rescheduling needs Participate in Transition of Care Program/Attend TOC scheduled calls Take medications as prescribed   check blood pressure daily write blood pressure results in a log or diary take blood pressure log to all doctor appointments call doctor for signs and symptoms of high blood pressure keep all doctor appointments take medications for blood pressure exactly as prescribed eat more whole grains, fruits and vegetables, lean meats and healthy fats  Plan:  Telephone follow up appointment with care management team member scheduled for:  06/05/24 with Alan, RN        Andrea Dimes RN, BSN [place]  [facility] RN Care Manager [phone]

## 2024-05-31 ENCOUNTER — Other Ambulatory Visit: Payer: Self-pay | Admitting: Family Medicine

## 2024-05-31 ENCOUNTER — Other Ambulatory Visit: Payer: Self-pay | Admitting: Nurse Practitioner

## 2024-05-31 DIAGNOSIS — I152 Hypertension secondary to endocrine disorders: Secondary | ICD-10-CM

## 2024-05-31 DIAGNOSIS — R1110 Vomiting, unspecified: Secondary | ICD-10-CM

## 2024-06-02 ENCOUNTER — Ambulatory Visit (INDEPENDENT_AMBULATORY_CARE_PROVIDER_SITE_OTHER): Admitting: Family Medicine

## 2024-06-02 ENCOUNTER — Encounter: Payer: Self-pay | Admitting: Family Medicine

## 2024-06-02 VITALS — BP 120/60 | HR 80 | Temp 98.2°F | Ht 68.0 in | Wt 240.0 lb

## 2024-06-02 DIAGNOSIS — I5030 Unspecified diastolic (congestive) heart failure: Secondary | ICD-10-CM

## 2024-06-02 DIAGNOSIS — K56609 Unspecified intestinal obstruction, unspecified as to partial versus complete obstruction: Secondary | ICD-10-CM | POA: Diagnosis not present

## 2024-06-02 DIAGNOSIS — E66812 Obesity, class 2: Secondary | ICD-10-CM

## 2024-06-02 DIAGNOSIS — N183 Chronic kidney disease, stage 3 unspecified: Secondary | ICD-10-CM | POA: Diagnosis not present

## 2024-06-02 DIAGNOSIS — F17200 Nicotine dependence, unspecified, uncomplicated: Secondary | ICD-10-CM

## 2024-06-02 DIAGNOSIS — Z6836 Body mass index (BMI) 36.0-36.9, adult: Secondary | ICD-10-CM

## 2024-06-02 DIAGNOSIS — I130 Hypertensive heart and chronic kidney disease with heart failure and stage 1 through stage 4 chronic kidney disease, or unspecified chronic kidney disease: Secondary | ICD-10-CM

## 2024-06-02 DIAGNOSIS — Z23 Encounter for immunization: Secondary | ICD-10-CM | POA: Diagnosis not present

## 2024-06-02 LAB — CBC WITH DIFFERENTIAL/PLATELET
Basophils Absolute: 0 x10E3/uL (ref 0.0–0.2)
Basos: 0 %
EOS (ABSOLUTE): 0.1 x10E3/uL (ref 0.0–0.4)
Eos: 1 %
Hematocrit: 31.3 % — ABNORMAL LOW (ref 34.0–46.6)
Hemoglobin: 10.1 g/dL — ABNORMAL LOW (ref 11.1–15.9)
Immature Grans (Abs): 0 x10E3/uL (ref 0.0–0.1)
Immature Granulocytes: 0 %
Lymphocytes Absolute: 1.4 x10E3/uL (ref 0.7–3.1)
Lymphs: 23 %
MCH: 28.9 pg (ref 26.6–33.0)
MCHC: 32.3 g/dL (ref 31.5–35.7)
MCV: 90 fL (ref 79–97)
Monocytes Absolute: 0.6 x10E3/uL (ref 0.1–0.9)
Monocytes: 10 %
Neutrophils Absolute: 4.1 x10E3/uL (ref 1.4–7.0)
Neutrophils: 65 %
Platelets: 263 x10E3/uL (ref 150–450)
RBC: 3.49 x10E6/uL — ABNORMAL LOW (ref 3.77–5.28)
RDW: 11.8 % (ref 11.7–15.4)
WBC: 6.3 x10E3/uL (ref 3.4–10.8)

## 2024-06-02 LAB — BASIC METABOLIC PANEL WITH GFR
BUN/Creatinine Ratio: 12 (ref 12–28)
BUN: 27 mg/dL (ref 8–27)
CO2: 23 mmol/L (ref 20–29)
Calcium: 8.8 mg/dL (ref 8.7–10.3)
Chloride: 106 mmol/L (ref 96–106)
Creatinine, Ser: 2.18 mg/dL — ABNORMAL HIGH (ref 0.57–1.00)
Glucose: 92 mg/dL (ref 70–99)
Potassium: 4.7 mmol/L (ref 3.5–5.2)
Sodium: 141 mmol/L (ref 134–144)
eGFR: 25 mL/min/1.73 — ABNORMAL LOW (ref >59)

## 2024-06-02 NOTE — Progress Notes (Signed)
 I,Jameka J Llittleton, CMA,acting as a neurosurgeon for [employer], NP.,have documented all relevant documentation on the behalf of [name], NP,as directed by  [name], NP while in the presence of [name], NP.  Subjective:  Patient ID: [name] , female    DOB: October 01, 1960 , 63 y.o.   MRN: [id number]  Chief Complaint  Patient presents with   HOSPITAL F/U    Patient presents today for a hospital f/u. Patient feels ok today. She went for a small bowel obstruction. She doesn't have any questions or concerns at this time.     HPI Discussed the use of AI scribe software for clinical note transcription with the patient, who gave verbal consent to proceed.  History of Present Illness     [name] is a 63 year old female who presents for a follow-up visit after a recent hospitalization for bowel obstruction.  She was hospitalized from Sunday to Wednesday last week due to a bowel obstruction, experiencing severe abdominal pain described as her stomach being 'in a knot' after church, followed by persistent vomiting. Her symptoms were relieved by a nasogastric tube placed in the hospital, and she was on a liquid diet for three days before being discharged after passing gas and having a bowel movement. She is now able to eat solid foods and is taking Miralax  every two days to maintain bowel regularity.  She has a history of a right ankle fracture from a fall at work last year, which resulted in three surgeries. Despite ongoing therapy twice a week, she reports persistent pain and a sensation of a 'shield' over the ankle. She uses a cane for stability and is under the care of an orthopedic specialist.  She has a history of congestive heart failure and experiences occasional symptoms such as her heart feeling 'heavy', which improved with her medication, including an 81 mg aspirin . Her blood pressure was elevated during her recent hospitalization due to not receiving her usual  medications, hydroxyzine and carvedilol , because of concerns about her kidneys and bowel obstruction. She is now back on her regular medication regimen.  Her social history includes a significant reduction in smoking, although she has smoked for about 30 years. She reports past cocaine use as a one-time event last year and has abstained from alcohol  since then. She is active in her church and enjoys spending time with her grandchildren.      Past Medical History:  Diagnosis Date   (HFpEF) heart failure with preserved ejection fraction (HCC) 05/15/2023   Adrenal adenoma 01/11/2021   Anemia    CHF (congestive heart failure) (HCC)    Chronic bronchitis (HCC)    probably once/yr (05/12/2013)   Chronic kidney disease    Diastolic heart failure    Dyslipidemia    pt denies this hx on 05/12/2013   Exertional shortness of breath    GERD (gastroesophageal reflux disease)    Heart murmur    Hypertension    Hypokalemia    Left ventricular hypertrophy 01/11/2021   Primary hypertension 05/12/2013   Venous stasis ulcers (HCC)      Family History  Problem Relation Age of Onset   Hypertension Mother    Kidney failure Mother    Heart attack Father    Hypertension Sister    Diabetes Sister    Hypertension Brother    Diabetes Sister      Current Outpatient Medications:    acetaminophen  (TYLENOL ) 325 MG tablet, Take 650 mg by mouth every  6 (six) hours as needed for mild pain or headache. (Patient not taking: Reported on 06/19/2024), Disp: , Rfl:    aspirin  EC 81 MG tablet, Take 81 mg by mouth daily. Swallow whole., Disp: , Rfl:    atorvastatin  (LIPITOR) 80 MG tablet, Take 1 tablet (80 mg total) by mouth daily., Disp: 90 tablet, Rfl: 3   carvedilol  (COREG ) 12.5 MG tablet, Take 1 tablet (12.5 mg total) by mouth 2 (two) times daily with a meal., Disp: 180 tablet, Rfl: 3   cetirizine (ZYRTEC) 10 MG tablet, Take 10 mg by mouth daily as needed for allergies. (Patient not taking: Reported on  06/19/2024), Disp: , Rfl:    cyclobenzaprine  (FEXMID ) 7.5 MG tablet, Take 7.5 mg by mouth 3 (three) times daily as needed., Disp: , Rfl:    Diclofenac Sodium 3 % GEL, SMARTSIG:1-2 Gram(s) Topical 3-4 Times Daily, Disp: , Rfl:    furosemide  (LASIX ) 40 MG tablet, TAKE 1 TABLET(40 MG) BY MOUTH DAILY, Disp: 90 tablet, Rfl: 1   hydrALAZINE  (APRESOLINE ) 50 MG tablet, Take 1 tablet (50 mg total) by mouth 3 (three) times daily., Disp: 270 tablet, Rfl: 1   polyethylene glycol (MIRALAX  / GLYCOLAX ) 17 g packet, Take 17 g by mouth daily as needed for mild constipation or moderate constipation., Disp: 14 each, Rfl: 5   spironolactone  (ALDACTONE ) 25 MG tablet, TAKE 1 TABLET BY MOUTH DAILY, Disp: 90 tablet, Rfl: 1   amLODipine  (NORVASC ) 10 MG tablet, TAKE 1 TABLET(10 MG) BY MOUTH DAILY, Disp: 90 tablet, Rfl: 1   Allergies  Allergen Reactions   Oxycodone  Other (See Comments)    Severe constipation    Lisinopril  Cough     Review of Systems  Constitutional: Negative.   Respiratory: Negative.  Negative for choking, chest tightness and wheezing.   Cardiovascular:  Positive for leg swelling.  Gastrointestinal:  Negative for abdominal distention, abdominal pain and constipation.  Musculoskeletal:  Positive for gait problem.       Cane  Psychiatric/Behavioral: Negative.       Today's Vitals   06/02/24 0940  BP: 120/60  Pulse: 80  Temp: 98.2 F (36.8 C)  TempSrc: Oral  Weight: 240 lb (108.9 kg)  Height: 5' 8 (1.727 m)  PainSc: 7   PainLoc: Ankle   Body mass index is 36.49 kg/m.  Wt Readings from Last 3 Encounters:  06/12/24 239 lb (108.4 kg)  06/02/24 240 lb (108.9 kg)  05/26/24 232 lb 2.3 oz (105.3 kg)    The ASCVD Risk score (Arnett DK, et al., 2019) failed to calculate for the following reasons:   The valid total cholesterol range is 130 to 320 mg/dL  Objective:  Physical Exam Constitutional:      Appearance: Normal appearance.  Cardiovascular:     Rate and Rhythm: Normal rate and  regular rhythm.     Pulses: Normal pulses.     Heart sounds: Normal heart sounds.  Pulmonary:     Effort: Pulmonary effort is normal.     Breath sounds: Normal breath sounds.  Abdominal:     General: Bowel sounds are normal.  Skin:    General: Skin is warm and dry.  Neurological:     Mental Status: She is alert and oriented to person, place, and time. Mental status is at baseline.         Assessment And Plan:  SBO (small bowel obstruction) (HCC)  Need for influenza vaccination -     Flu vaccine trivalent PF, 6mos and older(Flulaval,Afluria,Fluarix,Fluzone)  Tobacco dependence  Benign hypertensive heart and kidney disease with diastolic CHF, NYHA class II and CKD stage III (HCC) -     CBC with Differential/Platelet -     Basic metabolic panel with GFR  Class 2 severe obesity due to excess calories with serious comorbidity and body mass index (BMI) of 36.0 to 36.9 in adult   Assessment & Plan Hospital follow-up for unspecified intestinal obstruction Recent hospitalization for intestinal obstruction, resolved with nasogastric tube decompression. Currently tolerating solid food and having regular bowel movements. - Follow up with general surgery in 1-2 weeks as per discharge instructions.  Right ankle fracture, status post surgical repair Right ankle fracture with three surgeries. Reports ongoing pain and instability, using a cane for ambulation. Smoking may impact healing process. - Continue physical therapy sessions on Wednesdays and Fridays. - Follow up with orthopedic specialist as scheduled. - Encourage smoking cessation to aid healing.  Congestive heart failure   Chronic kidney disease Chronic kidney disease monitored by nephrologist. - Continue monitoring kidney function with nephrologist.  Hypertension Hypertension currently well-controlled on home regimen. - Continue current antihypertensive medications.  Constipation Constipation managed with Miralax  every  two days to maintain bowel regularity. - Continue Miralax  every two days.  Tobacco use disorder Long history of smoking, has reduced smoking significantly. Acknowledges impact on healing and overall health. - Encourage smoking cessation.  General Health Maintenance Discussed flu vaccination, hesitant due to daughter's experience. - Encourage flu vaccination.    Return in 9 weeks (on 08/04/2024), or if symptoms worsen or fail to improve, for physical.  Patient was given opportunity to ask questions. Patient verbalized understanding of the plan and was able to repeat key elements of the plan. All questions were answered to their satisfaction.    I, [name], NP, have reviewed all documentation for this visit. The documentation on 06/20/2024 for the exam, diagnosis, procedures, and orders are all accurate and complete.    IF YOU HAVE BEEN REFERRED TO A SPECIALIST, IT MAY TAKE 1-2 WEEKS TO SCHEDULE/PROCESS THE REFERRAL. IF YOU HAVE NOT HEARD FROM US /SPECIALIST IN TWO WEEKS, PLEASE GIVE US  A CALL AT [phone].

## 2024-06-05 ENCOUNTER — Other Ambulatory Visit: Payer: Self-pay

## 2024-06-05 ENCOUNTER — Encounter (HOSPITAL_BASED_OUTPATIENT_CLINIC_OR_DEPARTMENT_OTHER): Admitting: Cardiovascular Disease

## 2024-06-05 NOTE — Patient Instructions (Signed)
 Visit Information  Thank you for taking time to visit with me today. Please don't hesitate to contact me if I can be of assistance to you before our next scheduled telephone appointment.  Our next appointment is by telephone on 06/12/2024 at 10 am  Following is a copy of your care plan:   Goals Addressed             This Visit's Progress    VBCI Transitions of Care (TOC) Care Plan       Problems:  Recent Hospitalization for treatment of Small bowel obstruction  06/05/2024   Bowels moving daily.  Denies pain.  Knowledge Deficit Related to BP management  06/05/2024 Patient reports BP is good.   Goal:  Over the next 30 days, the patient will not experience hospital readmission  Interventions:  Transitions of Care: Doctor Visits  - discussed the importance of doctor visits Post discharge activity limitations prescribed by provider reviewed Reviewed Signs and symptoms of infection Medications reviewed- Patient  confirms she has all her medications and is taking as prescribed.  Discussed patient will resume PT for right ankle fracture- confirmed PT is going well.  Discussed management of SBO, diet reviewed Assessed Bowel movements  Hypertension Interventions: Last practice recorded BP readings:  BP Readings from Last 3 Encounters:  06/02/24 120/60  05/29/24 132/66  05/28/24 (!) 157/74   Most recent eGFR/CrCl:  Lab Results  Component Value Date   EGFR 25 (L) 06/02/2024    No components found for: CRCL  Evaluation of current treatment plan related to hypertension self management and patient's adherence to plan as established by provider Reviewed medications with patient and discussed importance of compliance Counseled on the importance of exercise goals with target of 150 minutes per week Reviewed proper technique for checking BP, advised to check 2 hours after taking BP medication, record and take to provider appointments Diet reviewed BP readings reviewed Discussed the  importance of requesting refills prior to running out  Patient Self Care Activities:  Attend all scheduled provider appointments Call pharmacy for medication refills 3-7 days in advance of running out of medications Call provider office for new concerns or questions  Notify RN Care Manager of TOC call rescheduling needs Participate in Transition of Care Program/Attend TOC scheduled calls Take medications as prescribed   check blood pressure daily write blood pressure results in a log or diary take blood pressure log to all doctor appointments call doctor for signs and symptoms of high blood pressure keep all doctor appointments take medications for blood pressure exactly as prescribed eat more whole grains, fruits and vegetables, lean meats and healthy fats  Plan:  Telephone follow up appointment with care management team member scheduled for:  06/12/2024 at 10 am        Patient verbalizes understanding of instructions and care plan provided today and agrees to view in MyChart. Active MyChart status and patient understanding of how to access instructions and care plan via MyChart confirmed with patient.     Telephone follow up appointment with care management team member scheduled for:  06/12/2024 at 10 am  Please call the care guide team at [phone] if you need to cancel or reschedule your appointment.   Please call the Suicide and Crisis Lifeline: 988 call the [place]  National Suicide Prevention Lifeline: (479)744-1539 or TTY: (229)719-7599 TTY 949-641-6579) to talk to a trained counselor call 1-800-273-TALK (toll free, 24 hour hotline) call 911 if you are experiencing a Mental Health or Behavioral Health  Crisis or need someone to talk to.  Alan Ee, RN, BSN, CEN [employer]- Transition of Care Team.  [facility] [phone]

## 2024-06-05 NOTE — Transitions of Care (Post Inpatient/ED Visit) (Signed)
 Transition of Care week 2  Visit Note  06/05/2024  Name: [name] MRN: [id number]          DOB: [birth year]  Situation: Patient enrolled in [facility] 30-day program. Visit completed with patient by telephone.   Background:   Initial Transition Care Management Follow-up Telephone Call Discharge Date and Diagnosis: 05/28/24, SBO   Past Medical History:  Diagnosis Date   (HFpEF) heart failure with preserved ejection fraction (HCC) 05/15/2023   Adrenal adenoma 01/11/2021   Anemia    CHF (congestive heart failure) (HCC)    Chronic bronchitis (HCC)    probably once/yr (05/12/2013)   Chronic kidney disease    Diastolic heart failure    Dyslipidemia    pt denies this hx on 05/12/2013   Exertional shortness of breath    GERD (gastroesophageal reflux disease)    Heart murmur    Hypertension    Hypokalemia    Left ventricular hypertrophy 01/11/2021   Primary hypertension 05/12/2013   Venous stasis ulcers (HCC)     Assessment: Patient reports that she is feeling better. Having normal daily bowel movements.  Patient Reported Symptoms: Cognitive Cognitive Status: Able to follow simple commands, Alert and oriented to person, place, and time, Normal speech and language skills      Neurological Neurological Review of Symptoms: No symptoms reported    HEENT HEENT Symptoms Reported: No symptoms reported      Cardiovascular Cardiovascular Symptoms Reported: Swelling in legs or feet Patient's Recent BP reading at home: Reports swelling to the right ankle from previous injury.  Has not checked BP at home. Normal BP per patient report at PT visit this week. Cardiovascular Management Strategies: Medication therapy Cardiovascular Self-Management Outcome: 3 (uncertain)  Respiratory Respiratory Symptoms Reported: No symptoms reported    Endocrine Endocrine Symptoms Reported: No symptoms reported Is patient diabetic?: No Endocrine Self-Management Outcome: 5 (very good)  Gastrointestinal  Gastrointestinal Symptoms Reported: No symptoms reported, Other Other Gastrointestinal Symptoms: Reports bowels moving daily. No NVD. denies any abdominal pain. reports she feels better. Gastrointestinal Self-Management Outcome: 4 (good)    Genitourinary Genitourinary Symptoms Reported: No symptoms reported    Integumentary Integumentary Symptoms Reported: No symptoms reported    Musculoskeletal Musculoskelatal Symptoms Reviewed: Unsteady gait Additional Musculoskeletal Details: Active with PT. Reports ankle is sore.        Psychosocial Psychosocial Symptoms Reported: Not assessed         There were no vitals filed for this visit.  Medications Reviewed Today     Reviewed by Rumalda Alan PENNER, RN (Registered Nurse) on 06/05/24 at 1149  Med List Status: <None>   Medication Order Taking? Sig Documenting Provider Last Dose Status Informant  acetaminophen  (TYLENOL ) 325 MG tablet [id number] Yes Take 650 mg by mouth every 6 (six) hours as needed for mild pain or headache. [provider]  Active Self, Pharmacy Records  amLODipine  (NORVASC ) 10 MG tablet [id number] Yes TAKE 1 TABLET(10 MG) BY MOUTH DAILY Petrina Pries, NP  Active   aspirin  EC 81 MG tablet [id number] Yes Take 81 mg by mouth daily. Swallow whole. [provider]  Active Self, Pharmacy Records  atorvastatin  (LIPITOR) 80 MG tablet [id number] Yes Take 1 tablet (80 mg total) by mouth daily. Raford Riggs, MD  Active Self, Pharmacy Records  carvedilol  (COREG ) 12.5 MG tablet [id number] Yes Take 1 tablet (12.5 mg total) by mouth 2 (two) times daily with a meal. Raford Riggs, MD  Active Self, Pharmacy Records  cetirizine (ZYRTEC) 10 MG tablet [id number]  Yes Take 10 mg by mouth daily as needed for allergies. [provider]  Active Self, Pharmacy Records  cyclobenzaprine  (FEXMID ) 7.5 MG tablet [id number] Yes Take 7.5 mg by mouth 3 (three) times daily as needed. [provider]  Active Self, Pharmacy  Records  Diclofenac Sodium 3 % GEL [id number] Yes SMARTSIG:1-2 Gram(s) Topical 3-4 Times Daily [provider]  Active Self, Pharmacy Records  furosemide  (LASIX ) 40 MG tablet [id number] Yes TAKE 1 TABLET(40 MG) BY MOUTH DAILY Georgina Speaks, FNP  Active Self, Pharmacy Records  hydrALAZINE  (APRESOLINE ) 50 MG tablet [id number] Yes Take 1 tablet (50 mg total) by mouth 3 (three) times daily. Georgina Speaks, FNP  Active Self, Pharmacy Records  polyethylene glycol (MIRALAX  / GLYCOLAX ) 17 g packet [id number] Yes Take 17 g by mouth daily as needed for mild constipation or moderate constipation. Petrina Pries, NP  Active Self, Pharmacy Records  spironolactone  (ALDACTONE ) 25 MG tablet [id number] Yes TAKE 1 TABLET BY MOUTH DAILY Georgina Speaks, FNP  Active Self, Pharmacy Records            Goals Addressed             This Visit's Progress    VBCI Transitions of Care (TOC) Care Plan       Problems:  Recent Hospitalization for treatment of Small bowel obstruction  06/05/2024   Bowels moving daily.  Denies pain.  Knowledge Deficit Related to BP management  06/05/2024 Patient reports BP is good.   Goal:  Over the next 30 days, the patient will not experience hospital readmission  Interventions:  Transitions of Care: Doctor Visits  - discussed the importance of doctor visits Post discharge activity limitations prescribed by provider reviewed Reviewed Signs and symptoms of infection Medications reviewed- Patient  confirms she has all her medications and is taking as prescribed.  Discussed patient will resume PT for right ankle fracture- confirmed PT is going well.  Discussed management of SBO, diet reviewed Assessed Bowel movements  Hypertension Interventions: Last practice recorded BP readings:  BP Readings from Last 3 Encounters:  06/02/24 120/60  05/29/24 132/66  05/28/24 (!) 157/74   Most recent eGFR/CrCl:  Lab Results  Component Value Date   EGFR 25 (L) 06/02/2024    No components  found for: CRCL  Evaluation of current treatment plan related to hypertension self management and patient's adherence to plan as established by provider Reviewed medications with patient and discussed importance of compliance Counseled on the importance of exercise goals with target of 150 minutes per week Reviewed proper technique for checking BP, advised to check 2 hours after taking BP medication, record and take to provider appointments Diet reviewed BP readings reviewed Discussed the importance of requesting refills prior to running out  Patient Self Care Activities:  Attend all scheduled provider appointments Call pharmacy for medication refills 3-7 days in advance of running out of medications Call provider office for new concerns or questions  Notify RN Care Manager of TOC call rescheduling needs Participate in Transition of Care Program/Attend TOC scheduled calls Take medications as prescribed   check blood pressure daily write blood pressure results in a log or diary take blood pressure log to all doctor appointments call doctor for signs and symptoms of high blood pressure keep all doctor appointments take medications for blood pressure exactly as prescribed eat more whole grains, fruits and vegetables, lean meats and healthy fats  Plan:  Telephone follow up appointment with care management team member scheduled for:  06/12/2024 at  10 am        Recommendation:   Continue Current Plan of Care  Follow Up Plan:   Telephone follow up appointment date/time:  06/12/2024 at 10 am  Alan Ee, RN, BSN, [employer]- Transition of Care Team.  [facility] [phone]

## 2024-06-12 ENCOUNTER — Other Ambulatory Visit: Payer: Self-pay | Admitting: *Deleted

## 2024-06-12 NOTE — Patient Instructions (Signed)
 Visit Information  Thank you for taking time to visit with me today. Please don't hesitate to contact me if I can be of assistance to you before our next scheduled telephone appointment.   Following is a copy of your care plan:   Goals Addressed             This Visit's Progress    VBCI Transitions of Care (TOC) Care Plan       Problems:  Recent Hospitalization for treatment of Small bowel obstruction   Knowledge Deficit Related to BP management    Goal:  Over the next 30 days, the patient will not experience hospital readmission  Interventions:  Transitions of Care: Doctor Visits  - discussed the importance of doctor visits Post discharge activity limitations prescribed by provider reviewed Reviewed Signs and symptoms of infection Medications reviewed- Patient  confirms she has all her medications and is taking as prescribed Discussed patient attending PT twice weekly Assessed Bowel movements  Hypertension Interventions: Last practice recorded BP readings:  BP Readings from Last 3 Encounters:  06/12/24 127/66  06/02/24 120/60  05/29/24 132/66   Most recent eGFR/CrCl:  Lab Results  Component Value Date   EGFR 25 (L) 06/02/2024    No components found for: CRCL  Evaluation of current treatment plan related to hypertension self management and patient's adherence to plan as established by provider Reviewed medications with patient and discussed importance of compliance Counseled on the importance of exercise goals with target of 150 minutes per week Reviewed proper technique for checking BP, advised to check 2 hours after taking BP medication, record and take to provider appointments Diet reviewed BP readings reviewed-patient checking twice daily Discussed the importance of requesting refills prior to running out  Patient Self Care Activities:  Attend all scheduled provider appointments Call pharmacy for medication refills 3-7 days in advance of running out of  medications Call provider office for new concerns or questions  Notify RN Care Manager of TOC call rescheduling needs Participate in Transition of Care Program/Attend TOC scheduled calls Take medications as prescribed   check blood pressure daily write blood pressure results in a log or diary take blood pressure log to all doctor appointments call doctor for signs and symptoms of high blood pressure keep all doctor appointments take medications for blood pressure exactly as prescribed eat more whole grains, fruits and vegetables, lean meats and healthy fats  Plan:  Telephone follow up appointment with care management team member scheduled for:  06/19/2024 at 11 am        Patient verbalizes understanding of instructions and care plan provided today and agrees to view in MyChart. Active MyChart status and patient understanding of how to access instructions and care plan via MyChart confirmed with patient.     Telephone follow up appointment with care management team member scheduled for:06/19/24 at 11am  Please call the care guide team at [phone] if you need to cancel or reschedule your appointment.   Please call 1-800-273-TALK (toll free, 24 hour hotline) go to [facility] Urgent [facility] [address], [place] [phone]) call 911 if you are experiencing a Mental Health or Behavioral Health Crisis or need someone to talk to.  Andrea Dimes RN, BSN [place]  [facility] RN Care Manager [phone]

## 2024-06-12 NOTE — Transitions of Care (Post Inpatient/ED Visit) (Signed)
 Transition of Care week 3  Visit Note  06/12/2024  Name: [name] MRN: [id number]          DOB: 1961/04/08  Situation: Patient enrolled in [facility] 30-day program. Visit completed with Ms. Flannigan by telephone.   Background:   Initial Transition Care Management Follow-up Telephone Call Discharge Date and Diagnosis: 05/28/24, SBO   Past Medical History:  Diagnosis Date   (HFpEF) heart failure with preserved ejection fraction (HCC) 05/15/2023   Adrenal adenoma 01/11/2021   Anemia    CHF (congestive heart failure) (HCC)    Chronic bronchitis (HCC)    probably once/yr (05/12/2013)   Chronic kidney disease    Diastolic heart failure    Dyslipidemia    pt denies this hx on 05/12/2013   Exertional shortness of breath    GERD (gastroesophageal reflux disease)    Heart murmur    Hypertension    Hypokalemia    Left ventricular hypertrophy 01/11/2021   Primary hypertension 05/12/2013   Venous stasis ulcers (HCC)     Assessment: Patient Reported Symptoms: Cognitive Cognitive Status: Able to follow simple commands, Normal speech and language skills, Alert and oriented to person, place, and time      Neurological Neurological Review of Symptoms: No symptoms reported    HEENT HEENT Symptoms Reported: No symptoms reported      Cardiovascular Cardiovascular Symptoms Reported: No symptoms reported Is patient checking Blood Pressure at home?: Yes Patient's Recent BP reading at home: BP today 127/66 Cardiovascular Management Strategies: Medication therapy, Diet modification, Routine screening Weight: 239 lb (108.4 kg) (home device) Cardiovascular Self-Management Outcome: 4 (good)  Respiratory Respiratory Symptoms Reported: No symptoms reported    Endocrine Endocrine Symptoms Reported: Not assessed    Gastrointestinal Gastrointestinal Symptoms Reported: No symptoms reported Gastrointestinal Self-Management Outcome: 4 (good)    Genitourinary Genitourinary Symptoms Reported:  Not assessed    Integumentary Integumentary Symptoms Reported: Not assessed    Musculoskeletal Musculoskelatal Symptoms Reviewed: Unsteady gait Additional Musculoskeletal Details: PT twice weekly, working on exercises at home provided by PT Musculoskeletal Self-Management Outcome: 4 (good)      Psychosocial Psychosocial Symptoms Reported: Not assessed         Vitals:   06/12/24 1033  BP: 127/66    Medications Reviewed Today     Reviewed by Lucky Andrea LABOR, RN (Registered Nurse) on 06/12/24 at 1017  Med List Status: <None>   Medication Order Taking? Sig Documenting Provider Last Dose Status Informant  acetaminophen  (TYLENOL ) 325 MG tablet [id number] Yes Take 650 mg by mouth every 6 (six) hours as needed for mild pain or headache. [provider]  Active Self, Pharmacy Records  amLODipine  (NORVASC ) 10 MG tablet [id number] Yes TAKE 1 TABLET(10 MG) BY MOUTH DAILY Petrina Pries, NP  Active   aspirin  EC 81 MG tablet [id number] Yes Take 81 mg by mouth daily. Swallow whole. [provider]  Active Self, Pharmacy Records  atorvastatin  (LIPITOR) 80 MG tablet [id number] Yes Take 1 tablet (80 mg total) by mouth daily. Raford Riggs, MD  Active Self, Pharmacy Records  carvedilol  (COREG ) 12.5 MG tablet [id number] Yes Take 1 tablet (12.5 mg total) by mouth 2 (two) times daily with a meal. Raford Riggs, MD  Active Self, Pharmacy Records  cetirizine (ZYRTEC) 10 MG tablet [id number]  Take 10 mg by mouth daily as needed for allergies.  Patient not taking: Reported on 06/12/2024   [provider]  Active Self, Pharmacy Records  cyclobenzaprine  (FEXMID ) 7.5 MG tablet [id number] Yes Take 7.5  mg by mouth 3 (three) times daily as needed. [provider]  Active Self, Pharmacy Records  Diclofenac Sodium 3 % GEL [id number] Yes SMARTSIG:1-2 Gram(s) Topical 3-4 Times Daily [provider]  Active Self, Pharmacy Records  furosemide  (LASIX ) 40 MG tablet [id number] Yes  TAKE 1 TABLET(40 MG) BY MOUTH DAILY Georgina Speaks, FNP  Active Self, Pharmacy Records  hydrALAZINE  (APRESOLINE ) 50 MG tablet [id number] Yes Take 1 tablet (50 mg total) by mouth 3 (three) times daily. Georgina Speaks, FNP  Active Self, Pharmacy Records  polyethylene glycol (MIRALAX  / GLYCOLAX ) 17 g packet [id number] Yes Take 17 g by mouth daily as needed for mild constipation or moderate constipation. Petrina Pries, NP  Active Self, Pharmacy Records  spironolactone  (ALDACTONE ) 25 MG tablet [id number] Yes TAKE 1 TABLET BY MOUTH DAILY Georgina Speaks, FNP  Active Self, Pharmacy Records            Recommendation:   Continue Current Plan of Care  Follow Up Plan:   Telephone follow-up in 1 week  Andrea Dimes RN, BSN [place]  [facility] RN Care Manager [phone]

## 2024-06-18 NOTE — Progress Notes (Signed)
 [name]                                          MRN: [id number]   06/18/2024   The VBCI Quality Team Specialist reviewed this patient medical record for the purposes of chart review for care gap closure. The following were reviewed: chart review for care gap closure-kidney health evaluation for diabetes:eGFR  and uACR.    VBCI Quality Team

## 2024-06-19 ENCOUNTER — Telehealth: Payer: Self-pay

## 2024-06-19 ENCOUNTER — Other Ambulatory Visit: Payer: Self-pay | Admitting: *Deleted

## 2024-06-19 NOTE — Transitions of Care (Post Inpatient/ED Visit) (Signed)
 Transition of Care week 4  Visit Note  06/19/2024  Name: [name] MRN: [id number]          DOB: [birth year]  Situation: Patient enrolled in [facility] 30-day program. Visit completed with Ms. Spease by telephone.   Background:   Initial Transition Care Management Follow-up Telephone Call Discharge Date and Diagnosis: 05/28/24, SBO   Past Medical History:  Diagnosis Date   (HFpEF) heart failure with preserved ejection fraction (HCC) 05/15/2023   Adrenal adenoma 01/11/2021   Anemia    CHF (congestive heart failure) (HCC)    Chronic bronchitis (HCC)    probably once/yr (05/12/2013)   Chronic kidney disease    Diastolic heart failure    Dyslipidemia    pt denies this hx on 05/12/2013   Exertional shortness of breath    GERD (gastroesophageal reflux disease)    Heart murmur    Hypertension    Hypokalemia    Left ventricular hypertrophy 01/11/2021   Primary hypertension 05/12/2013   Venous stasis ulcers (HCC)     Assessment: Patient Reported Symptoms: Cognitive Cognitive Status: Able to follow simple commands, Alert and oriented to person, place, and time, Normal speech and language skills      Neurological Neurological Review of Symptoms: No symptoms reported    HEENT HEENT Symptoms Reported: Not assessed      Cardiovascular Cardiovascular Symptoms Reported: No symptoms reported Is patient checking Blood Pressure at home?: Yes Patient's Recent BP reading at home: BP today 127/81 Cardiovascular Self-Management Outcome: 4 (good)  Respiratory Respiratory Symptoms Reported: No symptoms reported    Endocrine Endocrine Symptoms Reported: Not assessed    Gastrointestinal Gastrointestinal Symptoms Reported: No symptoms reported Other Gastrointestinal Symptoms: LBM 06/19/24 Gastrointestinal Self-Management Outcome: 4 (good)    Genitourinary Genitourinary Symptoms Reported: No symptoms reported    Integumentary Integumentary Symptoms Reported: No symptoms reported     Musculoskeletal Musculoskelatal Symptoms Reviewed: Unsteady gait Additional Musculoskeletal Details: PT twice weekly, ambulates with cane Musculoskeletal Self-Management Outcome: 4 (good) Falls in the past year?: No    Psychosocial Psychosocial Symptoms Reported: Not assessed         Vitals:   06/19/24 1129  BP: 127/81    Medications Reviewed Today     Reviewed by Lucky Andrea LABOR, RN (Registered Nurse) on 06/19/24 at 1125  Med List Status: <None>   Medication Order Taking? Sig Documenting Provider Last Dose Status Informant  acetaminophen  (TYLENOL ) 325 MG tablet [id number]  Take 650 mg by mouth every 6 (six) hours as needed for mild pain or headache.  Patient not taking: Reported on 06/19/2024   [provider]  Active Self, Pharmacy Records  amLODipine  (NORVASC ) 10 MG tablet [id number] Yes TAKE 1 TABLET(10 MG) BY MOUTH DAILY Petrina Pries, NP  Active   aspirin  EC 81 MG tablet [id number] Yes Take 81 mg by mouth daily. Swallow whole. [provider]  Active Self, Pharmacy Records  atorvastatin  (LIPITOR) 80 MG tablet [id number] Yes Take 1 tablet (80 mg total) by mouth daily. Raford Riggs, MD  Active Self, Pharmacy Records  carvedilol  (COREG ) 12.5 MG tablet [id number] Yes Take 1 tablet (12.5 mg total) by mouth 2 (two) times daily with a meal. Raford Riggs, MD  Active Self, Pharmacy Records  cetirizine (ZYRTEC) 10 MG tablet [id number]  Take 10 mg by mouth daily as needed for allergies.  Patient not taking: Reported on 06/19/2024   [provider]  Active Self, Pharmacy Records  cyclobenzaprine  (FEXMID ) 7.5 MG tablet [id number] Yes Take 7.5 mg by  mouth 3 (three) times daily as needed. [provider]  Active Self, Pharmacy Records  Diclofenac Sodium 3 % GEL [id number] Yes SMARTSIG:1-2 Gram(s) Topical 3-4 Times Daily [provider]  Active Self, Pharmacy Records  furosemide  (LASIX ) 40 MG tablet [id number] Yes TAKE 1 TABLET(40 MG) BY MOUTH  DAILY Georgina Speaks, FNP  Active Self, Pharmacy Records  hydrALAZINE  (APRESOLINE ) 50 MG tablet [id number] Yes Take 1 tablet (50 mg total) by mouth 3 (three) times daily. Georgina Speaks, FNP  Active Self, Pharmacy Records  polyethylene glycol (MIRALAX  / GLYCOLAX ) 17 g packet [id number] Yes Take 17 g by mouth daily as needed for mild constipation or moderate constipation. Petrina Pries, NP  Active Self, Pharmacy Records  spironolactone  (ALDACTONE ) 25 MG tablet [id number] Yes TAKE 1 TABLET BY MOUTH DAILY Georgina Speaks, FNP  Active Self, Pharmacy Records            Recommendation:   Continue Current Plan of Care  Follow Up Plan:   Telephone follow-up in 1 week  Andrea Dimes RN, BSN [place]  [facility] RN Care Manager [phone]

## 2024-06-19 NOTE — Patient Instructions (Signed)
 Visit Information  Thank you for taking time to visit with me today. Please don't hesitate to contact me if I can be of assistance to you before our next scheduled telephone appointment.   Following is a copy of your care plan:   Goals Addressed             This Visit's Progress    VBCI Transitions of Care (TOC) Care Plan       Problems:  Recent Hospitalization for treatment of Small bowel obstruction   Knowledge Deficit Related to BP management    Goal:  Over the next 30 days, the patient will not experience hospital readmission  Interventions:  Transitions of Care: Doctor Visits  - discussed the importance of doctor visits Post discharge activity limitations prescribed by provider reviewed Reviewed Signs and symptoms of infection Medications reviewed- Patient  confirms she has all her medications and is taking as prescribed Discussed patient attending PT twice weekly Assessed Bowel movements  Hypertension Interventions: Last practice recorded BP readings:  BP Readings from Last 3 Encounters:  06/19/24 127/81  06/12/24 127/66  06/02/24 120/60   Most recent eGFR/CrCl:  Lab Results  Component Value Date   EGFR 25 (L) 06/02/2024    No components found for: CRCL  Reviewed medications with patient and discussed importance of compliance Counseled on the importance of exercise goals with target of 150 minutes per week BP readings reviewed-patient checking twice daily Discussed the importance of requesting refills prior to running out  Patient Self Care Activities:  Attend all scheduled provider appointments Call pharmacy for medication refills 3-7 days in advance of running out of medications Call provider office for new concerns or questions  Notify RN Care Manager of TOC call rescheduling needs Participate in Transition of Care Program/Attend TOC scheduled calls Take medications as prescribed   check blood pressure daily write blood pressure results in a log or  diary take blood pressure log to all doctor appointments call doctor for signs and symptoms of high blood pressure keep all doctor appointments take medications for blood pressure exactly as prescribed eat more whole grains, fruits and vegetables, lean meats and healthy fats  Plan:  Telephone follow up appointment with care management team member scheduled for:  05/26/2024 at 11 am        Patient verbalizes understanding of instructions and care plan provided today and agrees to view in MyChart. Active MyChart status and patient understanding of how to access instructions and care plan via MyChart confirmed with patient.     Telephone follow up appointment with care management team member scheduled for:06/26/24 at 11:00am  Please call the care guide team at [phone] if you need to cancel or reschedule your appointment.   Please call 1-800-273-TALK (toll free, 24 hour hotline) go to [facility] Urgent [facility] [address] [place] [phone]) call 911 if you are experiencing a Mental Health or Behavioral Health Crisis or need someone to talk to.  Andrea Dimes RN, BSN   [facility] Health RN Care Manager [phone]

## 2024-06-19 NOTE — Patient Outreach (Signed)
 Received an update from Andrea Dimes RN with the [facility] team advising patient enrolled in the [facility] 30 day program. Andrea spoke with her this am and should have one more visit with her.   Clayborne Ly RN BSN CCM [place]  [facility], [facility] Health Nurse Care Coordinator  Direct Dial: [phone] Website: [email or web address]

## 2024-06-20 ENCOUNTER — Ambulatory Visit: Payer: Self-pay | Admitting: Family Medicine

## 2024-06-20 DIAGNOSIS — Z6836 Body mass index (BMI) 36.0-36.9, adult: Secondary | ICD-10-CM | POA: Insufficient documentation

## 2024-06-20 DIAGNOSIS — N183 Chronic kidney disease, stage 3 unspecified: Secondary | ICD-10-CM | POA: Insufficient documentation

## 2024-06-20 NOTE — Assessment & Plan Note (Signed)
 Recent hospitalization for intestinal obstruction, resolved with nasogastric tube decompression. Currently tolerating solid food and having regular bowel movements. - Follow up with general surgery in 1-2 weeks as per discharge instructions.

## 2024-06-20 NOTE — Assessment & Plan Note (Signed)
 Long history of smoking, has reduced smoking significantly.  Acknowledges impact on healing and overall health. - Encourage smoking cessation.

## 2024-06-20 NOTE — Assessment & Plan Note (Addendum)
-  Congestive heart failure managed by cardiologist.  - Follow up with cardiologist as scheduled. - Continue monitoring kidney function with nephrologist.

## 2024-06-26 ENCOUNTER — Other Ambulatory Visit: Payer: Self-pay | Admitting: *Deleted

## 2024-06-26 NOTE — Transitions of Care (Post Inpatient/ED Visit) (Signed)
 Transition of Care week 5  Visit Note  06/26/2024  Name: [name] MRN: [id number]          DOB: Jul 07, 1961  Situation: Patient enrolled in [facility] 30-day program. Visit completed with Ms. Sapien by telephone.   Background:   Initial Transition Care Management Follow-up Telephone Call Discharge Date and Diagnosis: 05/28/24, SBO   Past Medical History:  Diagnosis Date   (HFpEF) heart failure with preserved ejection fraction (HCC) 05/15/2023   Adrenal adenoma 01/11/2021   Anemia    CHF (congestive heart failure) (HCC)    Chronic bronchitis (HCC)    probably once/yr (05/12/2013)   Chronic kidney disease    Diastolic heart failure    Dyslipidemia    pt denies this hx on 05/12/2013   Exertional shortness of breath    GERD (gastroesophageal reflux disease)    Heart murmur    Hypertension    Hypokalemia    Left ventricular hypertrophy 01/11/2021   Primary hypertension 05/12/2013   Venous stasis ulcers (HCC)     Assessment: Patient Reported Symptoms: Cognitive Cognitive Status: Able to follow simple commands, Alert and oriented to person, place, and time, Normal speech and language skills      Neurological Neurological Review of Symptoms: No symptoms reported    HEENT HEENT Symptoms Reported: Not assessed      Cardiovascular Cardiovascular Symptoms Reported: No symptoms reported Does patient have uncontrolled Hypertension?: Yes Is patient checking Blood Pressure at home?: Yes Patient's Recent BP reading at home: BP today 140/86 Cardiovascular Management Strategies: Medication therapy, Routine screening, Diet modification, Adequate rest Cardiovascular Self-Management Outcome: 4 (good)  Respiratory Respiratory Symptoms Reported: No symptoms reported    Endocrine Endocrine Symptoms Reported: Not assessed    Gastrointestinal Gastrointestinal Symptoms Reported: No symptoms reported Other Gastrointestinal Symptoms: LBM 06/26/24 Gastrointestinal Management Strategies:  Diet modification, Adequate rest, Medication therapy Gastrointestinal Self-Management Outcome: 4 (good)    Genitourinary Genitourinary Symptoms Reported: No symptoms reported    Integumentary Integumentary Symptoms Reported: No symptoms reported    Musculoskeletal Additional Musculoskeletal Details: PT now once weekly Musculoskeletal Self-Management Outcome: 4 (good) Falls in the past year?: No    Psychosocial Psychosocial Symptoms Reported: Not assessed         Vitals:   06/26/24 1104  BP: (!) 140/86    Medications Reviewed Today     Reviewed by Lucky Andrea LABOR, RN (Registered Nurse) on 06/26/24 at 1055  Med List Status: <None>   Medication Order Taking? Sig Documenting Provider Last Dose Status Informant  acetaminophen  (TYLENOL ) 325 MG tablet [id number] Yes Take 650 mg by mouth every 6 (six) hours as needed for mild pain or headache. [provider]  Active Self, Pharmacy Records  amLODipine  (NORVASC ) 10 MG tablet [id number] Yes TAKE 1 TABLET(10 MG) BY MOUTH DAILY Petrina Pries, NP  Active   aspirin  EC 81 MG tablet [id number] Yes Take 81 mg by mouth daily. Swallow whole. [provider]  Active Self, Pharmacy Records  atorvastatin  (LIPITOR) 80 MG tablet [id number] Yes Take 1 tablet (80 mg total) by mouth daily. Raford Riggs, MD  Active Self, Pharmacy Records  carvedilol  (COREG ) 12.5 MG tablet [id number] Yes Take 1 tablet (12.5 mg total) by mouth 2 (two) times daily with a meal. Raford Riggs, MD  Active Self, Pharmacy Records  cetirizine (ZYRTEC) 10 MG tablet [id number]  Take 10 mg by mouth daily as needed for allergies.  Patient not taking: Reported on 06/26/2024   [provider]  Active Self, Pharmacy Records  cyclobenzaprine  (FEXMID ) 7.5 MG tablet [id number] Yes Take 7.5 mg by mouth 3 (three) times daily as needed. [provider]  Active Self, Pharmacy Records  Diclofenac Sodium 3 % GEL [id number] Yes SMARTSIG:1-2 Gram(s) Topical 3-4 Times  Daily [provider]  Active Self, Pharmacy Records  furosemide  (LASIX ) 40 MG tablet [id number] Yes TAKE 1 TABLET(40 MG) BY MOUTH DAILY Georgina Speaks, FNP  Active Self, Pharmacy Records  hydrALAZINE  (APRESOLINE ) 50 MG tablet [id number] Yes Take 1 tablet (50 mg total) by mouth 3 (three) times daily. Georgina Speaks, FNP  Active Self, Pharmacy Records  polyethylene glycol (MIRALAX  / GLYCOLAX ) 17 g packet [id number] Yes Take 17 g by mouth daily as needed for mild constipation or moderate constipation. Petrina Pries, NP  Active Self, Pharmacy Records  spironolactone  (ALDACTONE ) 25 MG tablet [id number] Yes TAKE 1 TABLET BY MOUTH DAILY Georgina Speaks, FNP  Active Self, Pharmacy Records            Recommendation:   Continue Current Plan of Care  Follow Up Plan:   Telephone follow-up in 1 week  Andrea Dimes RN, BSN [place]  [facility] Health RN Care Manager [phone]

## 2024-06-26 NOTE — Patient Instructions (Signed)
 Visit Information  Thank you for taking time to visit with me today. Please don't hesitate to contact me if I can be of assistance to you before our next scheduled telephone appointment.   Following is a copy of your care plan:   Goals Addressed             This Visit's Progress    COMPLETED: VBCI Transitions of Care (TOC) Care Plan       Problems:  Recent Hospitalization for treatment of Small bowel obstruction   Knowledge Deficit Related to BP management    Goal:  Over the next 30 days, the patient will not experience hospital readmission  Interventions:  Transitions of Care: Doctor Visits  - discussed the importance of doctor visits Medications reviewed- Patient confirms she has all her medications and is taking as prescribed Discussed patient attending PT weekly Assessed Bowel movements Collaborated with Clayborne, RN-patient to resume CCM-currently scheduled on 07/10/24  Hypertension Interventions: Last practice recorded BP readings:  BP Readings from Last 3 Encounters:  06/26/24 (!) 140/86  06/19/24 127/81  06/12/24 127/66   Most recent eGFR/CrCl:  Lab Results  Component Value Date   EGFR 25 (L) 06/02/2024    No components found for: CRCL  Reviewed medications with patient and discussed importance of compliance Counseled on the importance of exercise goals with target of 150 minutes per week BP readings reviewed-patient checking twice daily Discussed BP a little higher today as she recently lost her best friend and will attend the funeral today Discussed the importance of requesting refills prior to running out  Patient Self Care Activities:  Attend all scheduled provider appointments Call pharmacy for medication refills 3-7 days in advance of running out of medications Call provider office for new concerns or questions  Notify RN Care Manager of TOC call rescheduling needs Participate in Transition of Care Program/Attend TOC scheduled calls Take medications  as prescribed   check blood pressure daily write blood pressure results in a log or diary take blood pressure log to all doctor appointments call doctor for signs and symptoms of high blood pressure keep all doctor appointments take medications for blood pressure exactly as prescribed eat more whole grains, fruits and vegetables, lean meats and healthy fats  Plan:  Telephone follow up appointment with care management team member scheduled for:  07/10/2024 at 1 pm        Patient verbalizes understanding of instructions and care plan provided today and agrees to view in MyChart. Active MyChart status and patient understanding of how to access instructions and care plan via MyChart confirmed with patient.     Telephone follow up appointment with care management team member scheduled for:07/10/24 at 1pm  Please call the care guide team at [phone] if you need to cancel or reschedule your appointment.   Please call 1-800-273-TALK (toll free, 24 hour hotline) go to [facility] [address], [place] [phone]) call 911 if you are experiencing a Mental Health or Behavioral Health Crisis or need someone to talk to.  Andrea Dimes RN, BSN [place]  [facility] Health RN Care Manager [phone]

## 2024-07-02 ENCOUNTER — Encounter: Admitting: Family Medicine

## 2024-07-03 ENCOUNTER — Encounter: Payer: Self-pay | Admitting: Family Medicine

## 2024-07-03 ENCOUNTER — Ambulatory Visit: Payer: Self-pay | Admitting: Family Medicine

## 2024-07-03 VITALS — BP 130/84 | HR 75 | Temp 98.2°F | Ht 68.0 in | Wt 248.0 lb

## 2024-07-03 DIAGNOSIS — Z0000 Encounter for general adult medical examination without abnormal findings: Secondary | ICD-10-CM | POA: Diagnosis not present

## 2024-07-03 DIAGNOSIS — I5030 Unspecified diastolic (congestive) heart failure: Secondary | ICD-10-CM

## 2024-07-03 DIAGNOSIS — N183 Chronic kidney disease, stage 3 unspecified: Secondary | ICD-10-CM | POA: Diagnosis not present

## 2024-07-03 DIAGNOSIS — I130 Hypertensive heart and chronic kidney disease with heart failure and stage 1 through stage 4 chronic kidney disease, or unspecified chronic kidney disease: Secondary | ICD-10-CM | POA: Diagnosis not present

## 2024-07-03 DIAGNOSIS — R300 Dysuria: Secondary | ICD-10-CM

## 2024-07-03 DIAGNOSIS — E782 Mixed hyperlipidemia: Secondary | ICD-10-CM

## 2024-07-03 DIAGNOSIS — Z833 Family history of diabetes mellitus: Secondary | ICD-10-CM

## 2024-07-03 DIAGNOSIS — D508 Other iron deficiency anemias: Secondary | ICD-10-CM

## 2024-07-03 LAB — POCT URINALYSIS DIP (CLINITEK)
Bilirubin, UA: NEGATIVE
Blood, UA: NEGATIVE
Glucose, UA: NEGATIVE mg/dL
Ketones, POC UA: NEGATIVE mg/dL
Leukocytes, UA: NEGATIVE
Nitrite, UA: NEGATIVE
POC PROTEIN,UA: >=300 — AB
Spec Grav, UA: 1.02 (ref 1.010–1.025)
Urobilinogen, UA: 0.2 U/dL
pH, UA: 5.5 (ref 5.0–8.0)

## 2024-07-03 MED ORDER — SPIRONOLACTONE 25 MG PO TABS: 25.0000 mg | ORAL_TABLET | Freq: Every day | ORAL | 1 refills | Status: AC

## 2024-07-03 NOTE — Patient Instructions (Signed)

## 2024-07-03 NOTE — Progress Notes (Signed)
 I,Jameka J Llittleton, CMA,acting as a neurosurgeon for [employer], NP.,have documented all relevant documentation on the behalf of [name], NP,as directed by  [name], NP while in the presence of [name], NP.  Subjective:    Patient ID: [name] , female    DOB: [birth year] , 63 y.o.   MRN: [id number]  Chief Complaint  Patient presents with   Annual Exam    Patient presents today for her physical. Patient reports compliance with her meds. Patient denies having any chest pain,sob or headaches at this time. Patient is concerned she may have a uti.     HPI Discussed the use of AI scribe software for clinical note transcription with the patient, who gave verbal consent to proceed.  History of Present Illness    [name] is a 63 year old female who presents for an annual physical exam.  She has a history of a broken ankle sustained in August of the previous year after slipping on orange juice at work. The injury involved three fractures of the fibula, requiring three surgeries. She is currently undergoing physical therapy twice a week on Wednesdays and Fridays. She describes the pain as severe, particularly at night, and likens it to 'worse than having a baby'.  About a month ago, she was hospitalized for a stomach blockage and has been doing okay since then.  She experiences frequent urination and a strong odor in her urine, suspecting a urinary tract infection. She attributes the frequent urination to her medication regimen, which includes spironolactone  and Lasix .  Her medication regimen includes spironolactone , Lasix , hydralazine , Norvasc , an 81 mg aspirin , and carvedilol . She reports running out of spironolactone  and cholesterol medication, having taken her last dose this morning, and is awaiting a refill from the pharmacy.  She has a family history of diabetes, with her mother and sister both diagnosed with the condition, although she has never been diagnosed  herself. She engages in regular physical activity, including walking.  She experiences allergies and was previously on allergy medication, which was discontinued during a hospital stay due to concerns about her kidneys. She uses a saline rinse to help with nasal symptoms.  She has a history of depression and past drug use, which she describes as experimental and ceased two years ago. She no longer consumes alcohol .      Past Medical History:  Diagnosis Date   (HFpEF) heart failure with preserved ejection fraction (HCC) 05/15/2023   Adrenal adenoma 01/11/2021   Anemia    CHF (congestive heart failure) (HCC)    Chronic bronchitis (HCC)    probably once/yr (05/12/2013)   Chronic kidney disease    Diastolic heart failure    Dyslipidemia    pt denies this hx on 05/12/2013   Exertional shortness of breath    GERD (gastroesophageal reflux disease)    Heart murmur    Hypertension    Hypokalemia    Left ventricular hypertrophy 01/11/2021   Primary hypertension 05/12/2013   Venous stasis ulcers (HCC)      Family History  Problem Relation Age of Onset   Hypertension Mother    Kidney failure Mother    Heart attack Father    Hypertension Sister    Diabetes Sister    Hypertension Brother    Diabetes Sister      Current Outpatient Medications:    acetaminophen  (TYLENOL ) 325 MG tablet, Take 650 mg by mouth every 6 (six) hours as needed for mild pain or headache., Disp: ,  Rfl:    amLODipine  (NORVASC ) 10 MG tablet, TAKE 1 TABLET(10 MG) BY MOUTH DAILY, Disp: 90 tablet, Rfl: 1   aspirin  EC 81 MG tablet, Take 81 mg by mouth daily. Swallow whole., Disp: , Rfl:    atorvastatin  (LIPITOR) 80 MG tablet, Take 1 tablet (80 mg total) by mouth daily., Disp: 90 tablet, Rfl: 3   carvedilol  (COREG ) 12.5 MG tablet, Take 1 tablet (12.5 mg total) by mouth 2 (two) times daily with a meal., Disp: 180 tablet, Rfl: 3   cetirizine (ZYRTEC) 10 MG tablet, Take 10 mg by mouth daily as needed for allergies.,  Disp: , Rfl:    cyclobenzaprine  (FEXMID ) 7.5 MG tablet, Take 7.5 mg by mouth 3 (three) times daily as needed., Disp: , Rfl:    Diclofenac Sodium 3 % GEL, SMARTSIG:1-2 Gram(s) Topical 3-4 Times Daily, Disp: , Rfl:    furosemide  (LASIX ) 40 MG tablet, TAKE 1 TABLET(40 MG) BY MOUTH DAILY, Disp: 90 tablet, Rfl: 1   hydrALAZINE  (APRESOLINE ) 50 MG tablet, Take 1 tablet (50 mg total) by mouth 3 (three) times daily., Disp: 270 tablet, Rfl: 1   polyethylene glycol (MIRALAX  / GLYCOLAX ) 17 g packet, Take 17 g by mouth daily as needed for mild constipation or moderate constipation., Disp: 14 each, Rfl: 5   spironolactone  (ALDACTONE ) 25 MG tablet, Take 1 tablet (25 mg total) by mouth daily., Disp: 90 tablet, Rfl: 1   Allergies  Allergen Reactions   Oxycodone  Other (See Comments)    Severe constipation    Lisinopril  Cough       Social History   Tobacco Use  Smoking Status Every Day   Current packs/day: 0.50   Average packs/day: 0.5 packs/day for 10.0 years (5.0 ttl pk-yrs)   Types: Cigarettes  Smokeless Tobacco Never  Tobacco Comments   trying to cut back, 3/29 - only had one cigarette today, average 4 a day   07/25/23 cut back to 1 a day   Social History   Substance and Sexual Activity  Alcohol  Use Not Currently   Comment: stopped last August     Review of Systems  Constitutional: Negative.   HENT: Negative.    Eyes: Negative.   Respiratory: Negative.    Cardiovascular: Negative.   Gastrointestinal: Negative.   Endocrine: Negative.   Genitourinary: Negative.   Musculoskeletal: Negative.        Has an ankle brace  Skin: Negative.   Allergic/Immunologic: Negative.   Neurological: Negative.   Hematological: Negative.   Psychiatric/Behavioral: Negative.       Today's Vitals   07/03/24 1000  BP: 130/84  Pulse: 75  Temp: 98.2 F (36.8 C)  TempSrc: Oral  Weight: 248 lb (112.5 kg)  Height: 5' 8 (1.727 m)  PainSc: 8   PainLoc: Ankle   Body mass index is 37.71 kg/m.  Wt  Readings from Last 3 Encounters:  07/03/24 248 lb (112.5 kg)  06/12/24 239 lb (108.4 kg)  06/02/24 240 lb (108.9 kg)     Objective:  Physical Exam Constitutional:      Appearance: Normal appearance.  HENT:     Head: Normocephalic.     Nose: Nose normal.  Cardiovascular:     Rate and Rhythm: Normal rate and regular rhythm.     Pulses: Normal pulses.     Heart sounds: Normal heart sounds.  Pulmonary:     Effort: Pulmonary effort is normal.     Breath sounds: Normal breath sounds.  Abdominal:     General: Bowel  sounds are normal.  Musculoskeletal:        General: Normal range of motion.  Skin:    General: Skin is warm and dry.  Neurological:     General: No focal deficit present.     Mental Status: She is alert and oriented to person, place, and time. Mental status is at baseline.  Psychiatric:        Mood and Affect: Mood normal.         Assessment And Plan:     Encounter for general adult medical examination w/o abnormal findings  Benign hypertensive heart and kidney disease with diastolic CHF, NYHA class II and CKD stage III (HCC) -     POCT URINALYSIS DIP (CLINITEK) -     CMP14+EGFR -     Microalbumin / creatinine urine ratio -     Spironolactone ; Take 1 tablet (25 mg total) by mouth daily.  Dispense: 90 tablet; Refill: 1 -     CBC with Differential/Platelet  Family history of diabetes mellitus in mother -     Hemoglobin A1c  Obesity, morbid, BMI 40.0-49.9 (HCC) Assessment & Plan: She is encouraged to strive for BMI less than 30 to decrease cardiac risk. Advised to aim for at least 150 minutes of exercise per week.    Mixed hyperlipidemia Assessment & Plan: Continue statin therapy  Orders: -     Lipid panel     Return in about 6 months (around 12/31/2024) for 1 year physical, bpc. Patient was given opportunity to ask questions. Patient verbalized understanding of the plan and was able to repeat key elements of the plan. All questions were answered to  their satisfaction.   I, [name], NP, have reviewed all documentation for this visit. The documentation on 07/14/2024 for the exam, diagnosis, procedures, and orders are all accurate and complete.

## 2024-07-04 LAB — CMP14+EGFR
ALT: 287 IU/L — ABNORMAL HIGH (ref 0–32)
AST: 188 IU/L — ABNORMAL HIGH (ref 0–40)
Albumin: 3.9 g/dL (ref 3.9–4.9)
Alkaline Phosphatase: 160 IU/L — ABNORMAL HIGH (ref 49–135)
BUN/Creatinine Ratio: 14 (ref 12–28)
BUN: 32 mg/dL — ABNORMAL HIGH (ref 8–27)
Bilirubin Total: 0.6 mg/dL (ref 0.0–1.2)
CO2: 21 mmol/L (ref 20–29)
Calcium: 8.6 mg/dL — ABNORMAL LOW (ref 8.7–10.3)
Chloride: 106 mmol/L (ref 96–106)
Creatinine, Ser: 2.25 mg/dL — ABNORMAL HIGH (ref 0.57–1.00)
Globulin, Total: 2.8 g/dL (ref 1.5–4.5)
Glucose: 122 mg/dL — ABNORMAL HIGH (ref 70–99)
Potassium: 4.9 mmol/L (ref 3.5–5.2)
Sodium: 142 mmol/L (ref 134–144)
Total Protein: 6.7 g/dL (ref 6.0–8.5)
eGFR: 24 mL/min/1.73 — ABNORMAL LOW (ref >59)

## 2024-07-04 LAB — CBC WITH DIFFERENTIAL/PLATELET
Basophils Absolute: 0 x10E3/uL (ref 0.0–0.2)
Basos: 1 %
EOS (ABSOLUTE): 0.1 x10E3/uL (ref 0.0–0.4)
Eos: 2 %
Hematocrit: 34.3 % (ref 34.0–46.6)
Hemoglobin: 10.5 g/dL — ABNORMAL LOW (ref 11.1–15.9)
Immature Grans (Abs): 0 x10E3/uL (ref 0.0–0.1)
Immature Granulocytes: 0 %
Lymphocytes Absolute: 1 x10E3/uL (ref 0.7–3.1)
Lymphs: 17 %
MCH: 28.5 pg (ref 26.6–33.0)
MCHC: 30.6 g/dL — ABNORMAL LOW (ref 31.5–35.7)
MCV: 93 fL (ref 79–97)
Monocytes Absolute: 0.5 x10E3/uL (ref 0.1–0.9)
Monocytes: 8 %
Neutrophils Absolute: 4.4 x10E3/uL (ref 1.4–7.0)
Neutrophils: 72 %
Platelets: 261 x10E3/uL (ref 150–450)
RBC: 3.69 x10E6/uL — ABNORMAL LOW (ref 3.77–5.28)
RDW: 11.7 % (ref 11.7–15.4)
WBC: 6.1 x10E3/uL (ref 3.4–10.8)

## 2024-07-04 LAB — LIPID PANEL
Chol/HDL Ratio: 3 ratio (ref 0.0–4.4)
Cholesterol, Total: 115 mg/dL (ref 100–199)
HDL: 38 mg/dL — ABNORMAL LOW (ref >39)
LDL Chol Calc (NIH): 63 mg/dL (ref 0–99)
Triglycerides: 66 mg/dL (ref 0–149)
VLDL Cholesterol Cal: 14 mg/dL (ref 5–40)

## 2024-07-04 LAB — HEMOGLOBIN A1C
Est. average glucose Bld gHb Est-mCnc: 88 mg/dL
Hgb A1c MFr Bld: 4.7 % — ABNORMAL LOW (ref 4.8–5.6)

## 2024-07-04 LAB — MICROALBUMIN / CREATININE URINE RATIO
Creatinine, Urine: 128.9 mg/dL
Microalb/Creat Ratio: 513 mg/g{creat} — ABNORMAL HIGH (ref 0–29)
Microalbumin, Urine: 661.5 ug/mL

## 2024-07-10 ENCOUNTER — Telehealth: Payer: Self-pay

## 2024-07-10 NOTE — Patient Instructions (Signed)
 Elveria Odea - I am sorry I was unable to reach you today for our scheduled appointment.  Your next care management appointment is by telephone on Wednesday, December 10 at 11:15 AM  Please call the care guide team at [phone] if you need to cancel, schedule, or reschedule an appointment.   Please call the Suicide and Crisis Lifeline: 988 if you are experiencing a Mental Health or Behavioral Health Crisis or need someone to talk to.  Clayborne Ly RN BSN CCM [place]  [facility], [facility] Health Nurse Care Coordinator  Direct Dial: [phone] Website: [email or web address]

## 2024-07-14 ENCOUNTER — Ambulatory Visit: Payer: Self-pay | Admitting: Family Medicine

## 2024-07-14 DIAGNOSIS — Z833 Family history of diabetes mellitus: Secondary | ICD-10-CM | POA: Insufficient documentation

## 2024-07-14 DIAGNOSIS — R300 Dysuria: Secondary | ICD-10-CM | POA: Insufficient documentation

## 2024-07-14 DIAGNOSIS — I130 Hypertensive heart and chronic kidney disease with heart failure and stage 1 through stage 4 chronic kidney disease, or unspecified chronic kidney disease: Secondary | ICD-10-CM | POA: Insufficient documentation

## 2024-07-14 NOTE — Assessment & Plan Note (Signed)
 Continue statin therapy

## 2024-07-14 NOTE — Assessment & Plan Note (Signed)
 She is encouraged to strive for BMI less than 30 to decrease cardiac risk. Advised to aim for at least 150 minutes of exercise per week.

## 2024-07-14 NOTE — Progress Notes (Signed)
 Your A1c 4.7, meaning that your blood sugar is well controlled, but your kidney function is same from a month ago. Do you see a kidney doctor? Will refer you to a nephrologist  Your blood count is low , most likely due to the kidney disease.The liver enzymes are elevated also, do you consume alcohol  regularly? And do you take a lot of acetaminophen  if yes minimize use.  I would have ordered ultrasound of abdomen but you had one done in October 2025, and it did not indicate any liver damage ; so we will recheck labs at your next appointment. So avoid alcohol  and minimize use of acetaminophen  eg Tylenol .   Thank you!

## 2024-07-21 ENCOUNTER — Telehealth

## 2024-07-30 ENCOUNTER — Encounter (HOSPITAL_BASED_OUTPATIENT_CLINIC_OR_DEPARTMENT_OTHER): Payer: Self-pay | Admitting: Cardiovascular Disease

## 2024-07-30 ENCOUNTER — Telehealth: Payer: Self-pay

## 2024-07-30 NOTE — Patient Instructions (Signed)
 Elveria Odea - I am sorry we were unable to complete our scheduled appointment today.   Your next care management appointment is by telephone on Wednesday, January 7 at 1:00 PM  Please call the care guide team at [phone] if you need to cancel, schedule, or reschedule an appointment.   Please call 1-800-273-TALK (toll free, 24 hour hotline) if you are experiencing a Mental Health or Behavioral Health Crisis or need someone to talk to.   Thank you,   Clayborne Ly RN BSN CCM [place]  [facility], [facility] Health Nurse Care Coordinator  Direct Dial: [phone] Website: [email or web address]

## 2024-07-30 NOTE — Progress Notes (Unsigned)
 This encounter was created in error - please disregard.

## 2024-08-27 ENCOUNTER — Other Ambulatory Visit: Payer: Self-pay

## 2024-08-27 ENCOUNTER — Other Ambulatory Visit: Payer: Self-pay | Admitting: Family Medicine

## 2024-08-27 DIAGNOSIS — L609 Nail disorder, unspecified: Secondary | ICD-10-CM | POA: Insufficient documentation

## 2024-08-27 DIAGNOSIS — H259 Unspecified age-related cataract: Secondary | ICD-10-CM | POA: Insufficient documentation

## 2024-09-01 NOTE — Patient Instructions (Addendum)
 Visit Information  Thank you for taking time to visit with me today. Please don't hesitate to contact me if I can be of assistance to you before our next scheduled appointment.  Your next care management appointment is by telephone on Wednesday, January 28 at 1:15 PM  Please call the care guide team at [phone] if you need to cancel, schedule, or reschedule an appointment.   Please call 1-800-273-TALK (toll free, 24 hour hotline) if you are experiencing a Mental Health or Behavioral Health Crisis or need someone to talk to.  Clayborne Ly RN BSN CCM [place]  [facility], [facility] Health Nurse Care Coordinator  Direct Dial: [phone] Website: [email or web address]

## 2024-09-01 NOTE — Patient Outreach (Signed)
 Complex Care Management   Visit Note  08/27/2024  Name:  [name] MRN: [id number] DOB: 1960-10-10  Situation: Referral received for Complex Care Management related to SDOH Barriers:  Housing living in a motel and Primary Hypertension, left eye Cataract, s/p right closed ankle fracture, Adrenal adenoma with Hyperaldosteronism, CKD stage IV. I obtained verbal consent from Patient.  Visit completed with Patient on the phone.  Background:   Past Medical History:  Diagnosis Date   (HFpEF) heart failure with preserved ejection fraction (HCC) 05/15/2023   Adrenal adenoma 01/11/2021   Anemia    CHF (congestive heart failure) (HCC)    Chronic bronchitis (HCC)    probably once/yr (05/12/2013)   Chronic kidney disease    Diastolic heart failure    Dyslipidemia    pt denies this hx on 05/12/2013   Exertional shortness of breath    GERD (gastroesophageal reflux disease)    Heart murmur    Hypertension    Hypokalemia    Left ventricular hypertrophy 01/11/2021   Primary hypertension 05/12/2013   Venous stasis ulcers (HCC)     Assessment: Patient Reported Symptoms:  Cognitive Cognitive Status: Alert and oriented to person, place, and time, Normal speech and language skills Cognitive/Intellectual Conditions Management [RPT]: None reported or documented in medical history or problem list   Health Maintenance Behaviors: Annual physical exam Health Facilitated by: Pain control, Rest  Neurological Neurological Review of Symptoms: No symptoms reported    HEENT HEENT Symptoms Reported: No symptoms reported      Cardiovascular Cardiovascular Symptoms Reported: No symptoms reported Does patient have uncontrolled Hypertension?: No Cardiovascular Management Strategies: Adequate rest, Medication therapy, Routine screening, Diet modification Cardiovascular Self-Management Outcome: 4 (good)  Respiratory Respiratory Symptoms Reported: No symptoms reported    Endocrine Endocrine Symptoms  Reported: No symptoms reported Is patient diabetic?: No    Gastrointestinal Gastrointestinal Symptoms Reported: No symptoms reported      Genitourinary Genitourinary Symptoms Reported: No symptoms reported    Integumentary Integumentary Symptoms Reported: Other Other Integumentary Symptoms: changes with L hallux toenail Additional Integumentary Details: Patient with upcoming scheduled appointment with Dr. Christine, [facility] on 09/10/24 Skin Management Strategies: Routine screening Skin Self-Management Outcome: 3 (uncertain)  Musculoskeletal Musculoskelatal Symptoms Reviewed: Limited mobility, Unsteady gait, Difficulty walking Musculoskeletal Management Strategies: Adequate rest, Routine screening, Medical device Musculoskeletal Self-Management Outcome: 4 (good) Falls in the past year?: No Number of falls in past year: 1 or less Was there an injury with Fall?: No Fall Risk Category Calculator: 0 Patient Fall Risk Level: Low Fall Risk Patient at Risk for Falls Due to: Impaired balance/gait, Impaired mobility, Orthopedic patient Fall risk Follow up: Falls evaluation completed, Education provided, Falls prevention discussed  Psychosocial Psychosocial Symptoms Reported: No symptoms reported   Major Change/Loss/Stressor/Fears (CP): Denies Quality of Family Relationships: supportive Do you feel physically threatened by others?: No    09/01/2024    PHQ2-9 Depression Screening   Zaniyah Wernette interest or pleasure in doing things    Feeling down, depressed, or hopeless    PHQ-2 - Total Score    Trouble falling or staying asleep, or sleeping too much    Feeling tired or having Kirbi Farrugia energy    Poor appetite or overeating     Feeling bad about yourself - or that you are a failure or have let yourself or your family down    Trouble concentrating on things, such as reading the newspaper or watching television    Moving or speaking so slowly that other people could have  noticed.  Or the opposite - being so  fidgety or restless that you have been moving around a lot more than usual    Thoughts that you would be better off dead, or hurting yourself in some way    PHQ2-9 Total Score    If you checked off any problems, how difficult have these problems made it for you to do your work, take care of things at home, or get along with other people    Depression Interventions/Treatment      There were no vitals filed for this visit. Pain Scale: Not given for pain  Medications Reviewed Today     Reviewed by Morgan Clayborne CROME, RN (Registered Nurse) on 08/27/24 at 1323  Med List Status: <None>   Medication Order Taking? Sig Documenting Provider Last Dose Status Informant  acetaminophen  (TYLENOL ) 325 MG tablet [id number]  Take 650 mg by mouth every 6 (six) hours as needed for mild pain or headache.  Patient not taking: Reported on 08/27/2024   [provider]  Active Self, Pharmacy Records  amLODipine  (NORVASC ) 10 MG tablet [id number]  TAKE 1 TABLET(10 MG) BY MOUTH DAILY Ohenhen, Pat, NP  Active   aspirin  EC 81 MG tablet [id number]  Take 81 mg by mouth daily. Swallow whole. [provider]  Active Self, Pharmacy Records  atorvastatin  (LIPITOR) 80 MG tablet [id number]  Take 1 tablet (80 mg total) by mouth daily. Raford Riggs, MD  Active Self, Pharmacy Records  carvedilol  (COREG ) 12.5 MG tablet [id number]  Take 1 tablet (12.5 mg total) by mouth 2 (two) times daily with a meal. Raford Riggs, MD  Active Self, Pharmacy Records  cetirizine (ZYRTEC) 10 MG tablet [id number]  Take 10 mg by mouth daily as needed for allergies. [provider]  Active Self, Pharmacy Records  cyclobenzaprine  (FEXMID ) 7.5 MG tablet [id number]  Take 7.5 mg by mouth 3 (three) times daily as needed. [provider]  Active Self, Pharmacy Records  Diclofenac Sodium 3 % GEL [id number]  SMARTSIG:1-2 Gram(s) Topical 3-4 Times Daily [provider]  Active Self, Pharmacy Records  furosemide  (LASIX ) 40  MG tablet [id number]  TAKE 1 TABLET(40 MG) BY MOUTH DAILY Georgina Speaks, FNP  Active Self, Pharmacy Records  hydrALAZINE  (APRESOLINE ) 50 MG tablet [id number]  Take 1 tablet (50 mg total) by mouth 3 (three) times daily. Georgina Speaks, FNP  Active Self, Pharmacy Records  polyethylene glycol (MIRALAX  / GLYCOLAX ) 17 g packet [id number]  Take 17 g by mouth daily as needed for mild constipation or moderate constipation. Petrina Pries, NP  Active Self, Pharmacy Records  Semaglutide,0.25 or 0.5MG /DOS, (OZEMPIC, 0.25 OR 0.5 MG/DOSE,) 2 MG/3ML SOPN [id number]  Inject shot weekly. Georgina Speaks, FNP  Active   spironolactone  (ALDACTONE ) 25 MG tablet [id number]  Take 1 tablet (25 mg total) by mouth daily. Petrina Pries, NP  Active             Recommendation:   Specialty provider follow-up   09/10/2024 Status: Sch   Time: 9:30 AM Length: 15  Visit Type: NEW PATIENT 15 [356] Copay: $0.00  Provider: Christine Rush, DPM Department: [facility]   Follow Up Plan:   Telephone follow up appointment date/time  09/17/2024 Status: Sch   Time: 1:15 PM Length: 30  Visit Type: [facility]BC[facility] TELEPHONE CALL 30 [2502] Copay: $0.00  Provider: Morgan Clayborne CROME, RN Department: [facility]   Clayborne Morgan RN BSN CCM [place]  [facility], [facility] Nurse Care Coordinator  Direct Dial: [phone] Website:  [email or web address]

## 2024-09-04 LAB — OPHTHALMOLOGY REPORT-SCANNED

## 2024-09-08 ENCOUNTER — Other Ambulatory Visit (HOSPITAL_BASED_OUTPATIENT_CLINIC_OR_DEPARTMENT_OTHER): Payer: Self-pay | Admitting: Cardiovascular Disease

## 2024-09-10 ENCOUNTER — Ambulatory Visit: Admitting: Podiatry

## 2024-09-12 ENCOUNTER — Ambulatory Visit: Admitting: Podiatry

## 2024-09-12 ENCOUNTER — Encounter: Payer: Self-pay | Admitting: Podiatry

## 2024-09-12 DIAGNOSIS — B351 Tinea unguium: Secondary | ICD-10-CM | POA: Diagnosis not present

## 2024-09-12 DIAGNOSIS — L600 Ingrowing nail: Secondary | ICD-10-CM | POA: Diagnosis not present

## 2024-09-12 DIAGNOSIS — M79674 Pain in right toe(s): Secondary | ICD-10-CM

## 2024-09-12 DIAGNOSIS — M79675 Pain in left toe(s): Secondary | ICD-10-CM

## 2024-09-12 NOTE — Progress Notes (Signed)
 Patient presents for evaluation and treatment of tenderness and some redness around nails feet.  Tenderness around toes with walking and wearing shoes.  Hallux nails are especially thick and painful when walking and wearing shoes.  She says she occasionally has some dried blood around the larger toenails.  Physical exam:  General appearance: Alert, pleasant, and in no acute distress.  Vascular: Pedal pulses: DP 2/4 B/L, PT 1/4 B/L.  Moderate edema lower legs bilaterally.  Capillary refill time immediate bilaterally  Neurologic: Light touch intact bilaterally.  Normal Achilles tendon reflex.  No spasticity or clonus.  Dermatologic:  Nails thickened, disfigured, discolored 1-5 BL with subungual debris.  Redness and hypertrophic nail folds along nail folds bilaterally but no signs of drainage or infection.  Right distally.  Nails bilaterally are especially thickened and gryphotic with some dried blood under the nail.  Tenderness with even light pressure on the nails.  No signs of cellulitis or bacterial infection.  Musculoskeletal:  Hallux valgus bilaterally.  Hammertoes 2 through 5 bilaterally tenderness hallux distal bilaterally.   Diagnosis: 1. Painful onychomycotic nails 1 through 5 bilaterally. 2. Pain toes 1 through 5 bilaterally. 3.  Ingrown nail hallux bilaterally  Plan: -New patient office visit for evaluation and management level 3.  Modifier 25 - Discussed the ingrown nails that are extremely gryphotic on the hallux bilaterally.  If these continue to be a problem we can consider a matrixectomy.  She does have some diminishment of pulses so would want to do ABIs before we did that.  Will try to just keep the nails trimmed on a periodic basis.  -Debrided onychomycotic nails 1 through 5 bilaterally.  Sharply debrided nails with nail clipper and reduced with a power bur.  Return 3 months RFC

## 2024-09-15 NOTE — Telephone Encounter (Signed)
" °  Cardiovascular: Beta Blockers - carvedilol  & nebivolol Failed01/19/2026 11:13 AM  Protocol Details Cr in normal range and within 365 days   ALT in normal range and within 365 days   AST in normal range and within 365 days     Last CMET on 07/03/24 Creatinine 2.25 ALT  287 AST 188  Will send to provider for advisement. "

## 2024-09-17 ENCOUNTER — Other Ambulatory Visit: Payer: Self-pay

## 2024-09-17 NOTE — Patient Outreach (Signed)
 Complex Care Management   Visit Note  09/17/2024  Name:  [name] MRN: [id number] DOB: 1961/02/04  Situation: Referral received for Complex Care Management related to SDOH Barriers: Housing living in a motel, Primary Hypertension due to endocrine disorder, Cataracts to both eyes, s/p right closed ankle fracture, Adrenal adenoma with Hyperaldosteronism, CKD stage IV.   I obtained verbal consent from Patient.  Visit completed with Patient on the phone.  Background:   Past Medical History:  Diagnosis Date   (HFpEF) heart failure with preserved ejection fraction (HCC) 05/15/2023   Adrenal adenoma 01/11/2021   Anemia    CHF (congestive heart failure) (HCC)    Chronic bronchitis (HCC)    probably once/yr (05/12/2013)   Chronic kidney disease    Diastolic heart failure    Dyslipidemia    pt denies this hx on 05/12/2013   Exertional shortness of breath    GERD (gastroesophageal reflux disease)    Heart murmur    Hypertension    Hypokalemia    Left ventricular hypertrophy 01/11/2021   Primary hypertension 05/12/2013   Venous stasis ulcers (HCC)     Assessment: Patient Reported Symptoms:  Cognitive Cognitive Status: Alert and oriented to person, place, and time, Normal speech and language skills Cognitive/Intellectual Conditions Management [RPT]: None reported or documented in medical history or problem list   Health Maintenance Behaviors: Annual physical exam Healing Pattern: Slow Health Facilitated by: Pain control, Rest  Neurological Neurological Review of Symptoms: No symptoms reported    HEENT HEENT Symptoms Reported: Sudden change or loss of vision HEENT Management Strategies: Routine screening HEENT Self-Management Outcome: 4 (good) HEENT Comment: Patient is scheduled for left eye Cataract removal in April    Cardiovascular Cardiovascular Symptoms Reported: fatigue, patient states today she feels more tired than usual, denies having any specific symptoms other than  fatigue  Does patient have uncontrolled Hypertension?: No Is patient checking Blood Pressure at home?: Yes Cardiovascular Management Strategies: Routine screening, Adequate rest, Medication therapy, Diet modification Cardiovascular Self-Management Outcome: 4 (good)  Respiratory Respiratory Symptoms Reported: No symptoms reported    Endocrine Endocrine Symptoms Reported: No symptoms reported Is patient diabetic?: No    Gastrointestinal Gastrointestinal Symptoms Reported: No symptoms reported      Genitourinary Genitourinary Symptoms Reported: No symptoms reported    Integumentary Integumentary Symptoms Reported: No symptoms reported Other Integumentary Symptoms: L hallux ingrown toenail removed Skin Management Strategies: Routine screening Skin Self-Management Outcome: 4 (good)  Musculoskeletal Musculoskelatal Symptoms Reviewed: No symptoms reported        Psychosocial Psychosocial Symptoms Reported: No symptoms reported   Major Change/Loss/Stressor/Fears (CP): Denies Quality of Family Relationships: supportive Do you feel physically threatened by others?: No    09/17/2024    PHQ2-9 Depression Screening   [name] [employer] interest or pleasure in doing things    Feeling down, depressed, or hopeless    PHQ-2 - Total Score    Trouble falling or staying asleep, or sleeping too much    Feeling tired or having [name] energy    Poor appetite or overeating     Feeling bad about yourself - or that you are a failure or have let yourself or your family down    Trouble concentrating on things, such as reading the newspaper or watching television    Moving or speaking so slowly that other people could have noticed.  Or the opposite - being so fidgety or restless that you have been moving around a lot more than usual    Thoughts that  you would be better off dead, or hurting yourself in some way    PHQ2-9 Total Score    If you checked off any problems, how difficult have these problems made it for  you to do your work, take care of things at home, or get along with other people    Depression Interventions/Treatment      There were no vitals filed for this visit. Pain Scale: Not given for pain  Medications Reviewed Today     Reviewed by Summer Clayborne CROME, RN (Registered Nurse) on 09/17/24 at 1317  Med List Status: <None>   Medication Order Taking? Sig Documenting Provider Last Dose Status Informant  acetaminophen  (TYLENOL ) 325 MG tablet [id number]  Take 650 mg by mouth every 6 (six) hours as needed for mild pain or headache.  Patient not taking: Reported on 09/12/2024   [provider]  Active Self, Pharmacy Records  amLODipine  (NORVASC ) 10 MG tablet [id number] Yes TAKE 1 TABLET(10 MG) BY MOUTH DAILY Summer Pries, NP  Active   aspirin  EC 81 MG tablet [id number] Yes Take 81 mg by mouth daily. Swallow whole. [provider]  Active Self, Pharmacy Records  atorvastatin  (LIPITOR) 80 MG tablet [id number] Yes Take 1 tablet (80 mg total) by mouth daily. Summer Riggs, MD  Active Self, Pharmacy Records  carvedilol  (COREG ) 12.5 MG tablet [id number] Yes Take 1 tablet (12.5 mg total) by mouth 2 (two) times daily with a meal. Summer Riggs, MD  Active Self, Pharmacy Records  cetirizine (ZYRTEC) 10 MG tablet [id number] Yes Take 10 mg by mouth daily as needed for allergies. [provider]  Active Self, Pharmacy Records  cyclobenzaprine  (FEXMID ) 7.5 MG tablet [id number] Yes Take 7.5 mg by mouth 3 (three) times daily as needed. [provider]  Active Self, Pharmacy Records  Diclofenac Sodium 3 % GEL [id number] Yes SMARTSIG:1-2 Gram(s) Topical 3-4 Times Daily [provider]  Active Self, Pharmacy Records  furosemide  (LASIX ) 40 MG tablet [id number] Yes TAKE 1 TABLET(40 MG) BY MOUTH DAILY Summer Speaks, FNP  Active Self, Pharmacy Records  hydrALAZINE  (APRESOLINE ) 50 MG tablet [id number] Yes Take 1 tablet (50 mg total) by mouth 3 (three) times daily. Summer Speaks,  FNP  Active Self, Pharmacy Records  polyethylene glycol (MIRALAX  / GLYCOLAX ) 17 g packet [id number] Yes Take 17 g by mouth daily as needed for mild constipation or moderate constipation. Summer Pries, NP  Active Self, Pharmacy Records  Semaglutide,0.25 or 0.5MG /DOS, (OZEMPIC, 0.25 OR 0.5 MG/DOSE,) 2 MG/3ML SOPN [id number] Yes Inject shot weekly. Summer Speaks, FNP  Active   spironolactone  (ALDACTONE ) 25 MG tablet [id number] Yes Take 1 tablet (25 mg total) by mouth daily. Summer Pries, NP  Active             Recommendation:   Continue Current Plan of Care  Follow Up Plan:   Closing From:  Complex Care Management  Clayborne Morgan RN BSN CCM [facility]  [facility], [facility] Nurse Care Coordinator  Direct Dial: [phone] Website: [email or web address]

## 2024-09-17 NOTE — Patient Instructions (Signed)
 Visit Information  Thank you for taking time to visit with me today.    Please call 1-800-273-TALK (toll free, 24 hour hotline) if you are experiencing a Mental Health or Behavioral Health Crisis or need someone to talk to.  Clayborne Ly RN BSN CCM [place]  [facility], [facility] Nurse Care Coordinator  Direct Dial: [phone] Website: [email or web address]

## 2024-09-26 ENCOUNTER — Other Ambulatory Visit: Payer: Self-pay | Admitting: Family Medicine

## 2024-09-26 DIAGNOSIS — R7309 Other abnormal glucose: Secondary | ICD-10-CM

## 2024-12-12 ENCOUNTER — Ambulatory Visit: Admitting: Podiatry

## 2024-12-31 ENCOUNTER — Ambulatory Visit: Admitting: Nurse Practitioner

## 2025-04-08 ENCOUNTER — Ambulatory Visit: Payer: Self-pay

## 2025-07-07 ENCOUNTER — Encounter: Payer: Self-pay | Admitting: Nurse Practitioner
